# Patient Record
Sex: Female | Born: 1935
Health system: Southern US, Community
[De-identification: ages and names within clinical notes are randomized; demographics above are authoritative.]

## PROBLEM LIST (undated history)

## (undated) DIAGNOSIS — Z95810 Presence of automatic (implantable) cardiac defibrillator: Secondary | ICD-10-CM

## (undated) DIAGNOSIS — I10 Essential (primary) hypertension: Secondary | ICD-10-CM

## (undated) DIAGNOSIS — I482 Chronic atrial fibrillation, unspecified: Secondary | ICD-10-CM

## (undated) DIAGNOSIS — M5136 Other intervertebral disc degeneration, lumbar region: Secondary | ICD-10-CM

## (undated) DIAGNOSIS — E782 Mixed hyperlipidemia: Secondary | ICD-10-CM

## (undated) DIAGNOSIS — I4901 Ventricular fibrillation: Secondary | ICD-10-CM

## (undated) DIAGNOSIS — M1990 Unspecified osteoarthritis, unspecified site: Secondary | ICD-10-CM

## (undated) DIAGNOSIS — R519 Headache, unspecified: Secondary | ICD-10-CM

## (undated) DIAGNOSIS — I442 Atrioventricular block, complete: Secondary | ICD-10-CM

## (undated) DIAGNOSIS — M51369 Other intervertebral disc degeneration, lumbar region without mention of lumbar back pain or lower extremity pain: Secondary | ICD-10-CM

## (undated) DIAGNOSIS — J45909 Unspecified asthma, uncomplicated: Secondary | ICD-10-CM

## (undated) DIAGNOSIS — J449 Chronic obstructive pulmonary disease, unspecified: Secondary | ICD-10-CM

## (undated) DIAGNOSIS — I2510 Atherosclerotic heart disease of native coronary artery without angina pectoris: Secondary | ICD-10-CM

## (undated) DIAGNOSIS — I5032 Chronic diastolic (congestive) heart failure: Secondary | ICD-10-CM

## (undated) DIAGNOSIS — M549 Dorsalgia, unspecified: Secondary | ICD-10-CM

## (undated) DIAGNOSIS — F419 Anxiety disorder, unspecified: Secondary | ICD-10-CM

## (undated) DIAGNOSIS — I509 Heart failure, unspecified: Secondary | ICD-10-CM

## (undated) DIAGNOSIS — I4891 Unspecified atrial fibrillation: Secondary | ICD-10-CM

## (undated) DIAGNOSIS — G8929 Other chronic pain: Secondary | ICD-10-CM

## (undated) HISTORY — DX: Unspecified osteoarthritis, unspecified site: M19.90

## (undated) HISTORY — PX: TONSILLECTOMY: SUR1361

## (undated) HISTORY — DX: Unspecified atrial fibrillation: I48.91

## (undated) HISTORY — DX: Essential (primary) hypertension: I10

## (undated) HISTORY — DX: Heart failure, unspecified: I50.9

## (undated) HISTORY — DX: Chronic obstructive pulmonary disease, unspecified: J44.9

## (undated) HISTORY — DX: Atherosclerotic heart disease of native coronary artery without angina pectoris: I25.10

## (undated) HISTORY — DX: Presence of automatic (implantable) cardiac defibrillator: Z95.810

## (undated) HISTORY — DX: Anxiety disorder, unspecified: F41.9

## (undated) HISTORY — DX: Chronic atrial fibrillation, unspecified: I48.20

## (undated) HISTORY — PX: CORONARY ARTERY BYPASS GRAFT: SHX141

## (undated) HISTORY — DX: Headache, unspecified: R51.9

## (undated) HISTORY — PX: CARDIAC DEFIBRILLATOR PLACEMENT: SHX171

## (undated) HISTORY — DX: Mixed hyperlipidemia: E78.2

---

## 2000-12-09 ENCOUNTER — Inpatient Hospital Stay (HOSPITAL_COMMUNITY): Admission: EM | Admit: 2000-12-09 | Discharge: 2000-12-11 | Payer: Self-pay

## 2001-01-12 ENCOUNTER — Ambulatory Visit (HOSPITAL_COMMUNITY): Admission: RE | Admit: 2001-01-12 | Discharge: 2001-01-12 | Payer: Self-pay | Admitting: Specialist

## 2001-01-12 ENCOUNTER — Encounter: Payer: Self-pay | Admitting: Specialist

## 2001-04-20 DIAGNOSIS — I4901 Ventricular fibrillation: Secondary | ICD-10-CM

## 2001-04-20 HISTORY — DX: Ventricular fibrillation: I49.01

## 2001-06-08 ENCOUNTER — Other Ambulatory Visit: Admission: RE | Admit: 2001-06-08 | Discharge: 2001-06-08 | Payer: Self-pay | Admitting: Obstetrics and Gynecology

## 2002-01-20 ENCOUNTER — Ambulatory Visit (HOSPITAL_COMMUNITY): Admission: RE | Admit: 2002-01-20 | Discharge: 2002-01-20 | Payer: Self-pay | Admitting: *Deleted

## 2002-01-20 ENCOUNTER — Encounter: Payer: Self-pay | Admitting: Specialist

## 2002-03-03 ENCOUNTER — Inpatient Hospital Stay (HOSPITAL_COMMUNITY): Admission: EM | Admit: 2002-03-03 | Discharge: 2002-03-08 | Payer: Self-pay | Admitting: Emergency Medicine

## 2002-03-03 ENCOUNTER — Encounter: Payer: Self-pay | Admitting: Emergency Medicine

## 2002-03-06 ENCOUNTER — Encounter: Payer: Self-pay | Admitting: Cardiology

## 2002-05-22 ENCOUNTER — Encounter: Payer: Self-pay | Admitting: Specialist

## 2002-05-22 ENCOUNTER — Inpatient Hospital Stay (HOSPITAL_COMMUNITY): Admission: EM | Admit: 2002-05-22 | Discharge: 2002-05-27 | Payer: Self-pay | Admitting: Emergency Medicine

## 2002-05-22 ENCOUNTER — Encounter: Payer: Self-pay | Admitting: Emergency Medicine

## 2003-01-25 ENCOUNTER — Encounter: Payer: Self-pay | Admitting: Specialist

## 2003-01-25 ENCOUNTER — Ambulatory Visit (HOSPITAL_COMMUNITY): Admission: RE | Admit: 2003-01-25 | Discharge: 2003-01-25 | Payer: Self-pay | Admitting: Specialist

## 2003-03-05 ENCOUNTER — Ambulatory Visit (HOSPITAL_COMMUNITY): Admission: RE | Admit: 2003-03-05 | Discharge: 2003-03-05 | Payer: Self-pay | Admitting: Cardiology

## 2003-06-22 ENCOUNTER — Ambulatory Visit (HOSPITAL_COMMUNITY): Admission: RE | Admit: 2003-06-22 | Discharge: 2003-06-22 | Payer: Self-pay | Admitting: Pulmonary Disease

## 2003-06-26 ENCOUNTER — Ambulatory Visit (HOSPITAL_COMMUNITY): Admission: RE | Admit: 2003-06-26 | Discharge: 2003-06-26 | Payer: Self-pay | Admitting: Internal Medicine

## 2004-02-29 ENCOUNTER — Ambulatory Visit (HOSPITAL_COMMUNITY): Admission: RE | Admit: 2004-02-29 | Discharge: 2004-02-29 | Payer: Self-pay | Admitting: Specialist

## 2004-04-11 ENCOUNTER — Emergency Department (HOSPITAL_COMMUNITY): Admission: EM | Admit: 2004-04-11 | Discharge: 2004-04-12 | Payer: Self-pay | Admitting: *Deleted

## 2004-04-15 ENCOUNTER — Ambulatory Visit (HOSPITAL_COMMUNITY): Admission: RE | Admit: 2004-04-15 | Discharge: 2004-04-15 | Payer: Self-pay | Admitting: *Deleted

## 2004-04-16 ENCOUNTER — Ambulatory Visit: Payer: Self-pay

## 2004-05-02 ENCOUNTER — Ambulatory Visit: Payer: Self-pay

## 2004-05-19 ENCOUNTER — Ambulatory Visit: Payer: Self-pay | Admitting: Cardiology

## 2004-05-22 ENCOUNTER — Ambulatory Visit: Payer: Self-pay | Admitting: Cardiology

## 2004-05-22 ENCOUNTER — Emergency Department (HOSPITAL_COMMUNITY): Admission: EM | Admit: 2004-05-22 | Discharge: 2004-05-22 | Payer: Self-pay | Admitting: Emergency Medicine

## 2004-05-27 ENCOUNTER — Ambulatory Visit: Payer: Self-pay | Admitting: Cardiology

## 2004-05-27 ENCOUNTER — Inpatient Hospital Stay (HOSPITAL_BASED_OUTPATIENT_CLINIC_OR_DEPARTMENT_OTHER): Admission: RE | Admit: 2004-05-27 | Discharge: 2004-05-27 | Payer: Self-pay | Admitting: Cardiology

## 2004-05-30 ENCOUNTER — Ambulatory Visit: Payer: Self-pay | Admitting: Cardiology

## 2004-06-16 ENCOUNTER — Ambulatory Visit: Payer: Self-pay | Admitting: Cardiology

## 2004-06-19 ENCOUNTER — Ambulatory Visit: Payer: Self-pay | Admitting: Cardiology

## 2004-06-19 ENCOUNTER — Ambulatory Visit (HOSPITAL_COMMUNITY): Admission: RE | Admit: 2004-06-19 | Discharge: 2004-06-20 | Payer: Self-pay | Admitting: Cardiology

## 2004-07-08 ENCOUNTER — Ambulatory Visit: Payer: Self-pay | Admitting: Cardiology

## 2004-08-06 ENCOUNTER — Ambulatory Visit: Payer: Self-pay

## 2004-09-12 ENCOUNTER — Ambulatory Visit: Payer: Self-pay | Admitting: Cardiology

## 2004-10-05 ENCOUNTER — Emergency Department (HOSPITAL_COMMUNITY): Admission: EM | Admit: 2004-10-05 | Discharge: 2004-10-05 | Payer: Self-pay | Admitting: Emergency Medicine

## 2004-12-05 ENCOUNTER — Ambulatory Visit: Payer: Self-pay | Admitting: Internal Medicine

## 2004-12-31 ENCOUNTER — Ambulatory Visit: Payer: Self-pay | Admitting: Cardiology

## 2005-03-09 ENCOUNTER — Ambulatory Visit (HOSPITAL_COMMUNITY): Admission: RE | Admit: 2005-03-09 | Discharge: 2005-03-09 | Payer: Self-pay | Admitting: Specialist

## 2005-04-06 ENCOUNTER — Ambulatory Visit: Payer: Self-pay | Admitting: Internal Medicine

## 2005-04-23 ENCOUNTER — Ambulatory Visit: Payer: Self-pay | Admitting: Cardiology

## 2005-07-21 ENCOUNTER — Ambulatory Visit: Payer: Self-pay | Admitting: Cardiology

## 2005-08-04 ENCOUNTER — Ambulatory Visit: Payer: Self-pay | Admitting: Internal Medicine

## 2005-08-18 ENCOUNTER — Ambulatory Visit (HOSPITAL_COMMUNITY): Admission: RE | Admit: 2005-08-18 | Discharge: 2005-08-18 | Payer: Self-pay | Admitting: Pulmonary Disease

## 2005-11-12 ENCOUNTER — Ambulatory Visit: Payer: Self-pay | Admitting: Internal Medicine

## 2006-02-11 ENCOUNTER — Ambulatory Visit: Payer: Self-pay | Admitting: Internal Medicine

## 2006-03-01 ENCOUNTER — Ambulatory Visit: Payer: Self-pay | Admitting: Cardiology

## 2006-03-15 ENCOUNTER — Encounter: Payer: Self-pay | Admitting: Cardiology

## 2006-03-15 ENCOUNTER — Ambulatory Visit: Payer: Self-pay

## 2006-04-08 ENCOUNTER — Ambulatory Visit (HOSPITAL_COMMUNITY): Admission: RE | Admit: 2006-04-08 | Discharge: 2006-04-08 | Payer: Self-pay | Admitting: Specialist

## 2006-05-06 ENCOUNTER — Ambulatory Visit: Payer: Self-pay | Admitting: Internal Medicine

## 2006-08-17 ENCOUNTER — Ambulatory Visit: Payer: Self-pay | Admitting: Internal Medicine

## 2006-09-04 ENCOUNTER — Ambulatory Visit: Payer: Self-pay | Admitting: Internal Medicine

## 2006-09-15 ENCOUNTER — Ambulatory Visit: Payer: Self-pay | Admitting: Cardiology

## 2006-09-15 LAB — CONVERTED CEMR LAB
BUN: 23 mg/dL (ref 6–23)
CO2: 27 meq/L (ref 19–32)
Calcium: 9.8 mg/dL (ref 8.4–10.5)
Chloride: 106 meq/L (ref 96–112)
Creatinine, Ser: 0.9 mg/dL (ref 0.4–1.2)
GFR calc Af Amer: 79 mL/min
GFR calc non Af Amer: 65 mL/min
Glucose, Bld: 94 mg/dL (ref 70–99)
Magnesium: 2.1 mg/dL (ref 1.5–2.5)
Potassium: 4 meq/L (ref 3.5–5.1)
Sodium: 141 meq/L (ref 135–145)

## 2006-11-18 ENCOUNTER — Ambulatory Visit: Payer: Self-pay | Admitting: Internal Medicine

## 2007-02-17 ENCOUNTER — Ambulatory Visit: Payer: Self-pay | Admitting: Internal Medicine

## 2007-03-08 ENCOUNTER — Ambulatory Visit: Payer: Self-pay | Admitting: Cardiology

## 2007-03-08 LAB — CONVERTED CEMR LAB
ALT: 19 units/L (ref 0–35)
AST: 19 units/L (ref 0–37)
Albumin: 4.3 g/dL (ref 3.5–5.2)
Alkaline Phosphatase: 45 units/L (ref 39–117)
BUN: 20 mg/dL (ref 6–23)
Bilirubin, Direct: 0.1 mg/dL (ref 0.0–0.3)
CO2: 28 meq/L (ref 19–32)
Calcium: 9.5 mg/dL (ref 8.4–10.5)
Chloride: 104 meq/L (ref 96–112)
Cholesterol: 115 mg/dL (ref 0–200)
Creatinine, Ser: 1 mg/dL (ref 0.4–1.2)
GFR calc Af Amer: 70 mL/min
GFR calc non Af Amer: 58 mL/min
Glucose, Bld: 111 mg/dL — ABNORMAL HIGH (ref 70–99)
HDL: 27.7 mg/dL — ABNORMAL LOW (ref >39.0)
LDL Cholesterol: 58 mg/dL (ref 0–99)
Potassium: 4.5 meq/L (ref 3.5–5.1)
Sodium: 142 meq/L (ref 135–145)
Total Bilirubin: 1 mg/dL (ref 0.3–1.2)
Total CHOL/HDL Ratio: 4.2
Total Protein: 7.1 g/dL (ref 6.0–8.3)
Triglycerides: 147 mg/dL (ref 0–149)
VLDL: 29 mg/dL (ref 0–40)

## 2007-04-26 ENCOUNTER — Ambulatory Visit (HOSPITAL_COMMUNITY): Admission: RE | Admit: 2007-04-26 | Discharge: 2007-04-26 | Payer: Self-pay | Admitting: Specialist

## 2007-05-19 ENCOUNTER — Ambulatory Visit: Payer: Self-pay | Admitting: Internal Medicine

## 2007-08-17 ENCOUNTER — Ambulatory Visit: Payer: Self-pay | Admitting: Internal Medicine

## 2007-08-23 ENCOUNTER — Other Ambulatory Visit: Admission: RE | Admit: 2007-08-23 | Discharge: 2007-08-23 | Payer: Self-pay | Admitting: Obstetrics & Gynecology

## 2007-08-29 ENCOUNTER — Inpatient Hospital Stay (HOSPITAL_COMMUNITY): Admission: AD | Admit: 2007-08-29 | Discharge: 2007-09-01 | Payer: Self-pay | Admitting: Cardiology

## 2007-08-29 ENCOUNTER — Ambulatory Visit: Payer: Self-pay | Admitting: Cardiology

## 2007-08-30 ENCOUNTER — Encounter: Payer: Self-pay | Admitting: Cardiology

## 2007-09-19 ENCOUNTER — Ambulatory Visit: Payer: Self-pay

## 2007-10-06 ENCOUNTER — Ambulatory Visit: Payer: Self-pay | Admitting: Cardiology

## 2007-10-06 LAB — CONVERTED CEMR LAB
BUN: 18 mg/dL (ref 6–23)
CO2: 27 meq/L (ref 19–32)
Calcium: 9.9 mg/dL (ref 8.4–10.5)
Chloride: 108 meq/L (ref 96–112)
Creatinine, Ser: 1 mg/dL (ref 0.4–1.2)
GFR calc Af Amer: 70 mL/min
GFR calc non Af Amer: 58 mL/min
Glucose, Bld: 99 mg/dL (ref 70–99)
Potassium: 3.9 meq/L (ref 3.5–5.1)
Sodium: 144 meq/L (ref 135–145)

## 2007-10-19 ENCOUNTER — Ambulatory Visit: Payer: Self-pay | Admitting: Internal Medicine

## 2007-11-23 ENCOUNTER — Ambulatory Visit: Payer: Self-pay | Admitting: Cardiology

## 2008-02-01 ENCOUNTER — Ambulatory Visit: Payer: Self-pay | Admitting: Internal Medicine

## 2008-03-12 ENCOUNTER — Ambulatory Visit (HOSPITAL_COMMUNITY): Admission: RE | Admit: 2008-03-12 | Discharge: 2008-03-12 | Payer: Self-pay | Admitting: Pulmonary Disease

## 2008-03-23 ENCOUNTER — Ambulatory Visit: Payer: Self-pay | Admitting: Cardiology

## 2008-03-23 LAB — CONVERTED CEMR LAB
ALT: 23 units/L (ref 0–35)
AST: 25 units/L (ref 0–37)
Albumin: 4.3 g/dL (ref 3.5–5.2)
Alkaline Phosphatase: 49 units/L (ref 39–117)
BUN: 19 mg/dL (ref 6–23)
Bilirubin, Direct: 0.2 mg/dL (ref 0.0–0.3)
CO2: 29 meq/L (ref 19–32)
Calcium: 9.3 mg/dL (ref 8.4–10.5)
Chloride: 106 meq/L (ref 96–112)
Cholesterol: 104 mg/dL (ref 0–200)
Creatinine, Ser: 0.9 mg/dL (ref 0.4–1.2)
GFR calc Af Amer: 79 mL/min
GFR calc non Af Amer: 65 mL/min
Glucose, Bld: 111 mg/dL — ABNORMAL HIGH (ref 70–99)
HDL: 28 mg/dL — ABNORMAL LOW (ref >39.0)
LDL Cholesterol: 56 mg/dL (ref 0–99)
Potassium: 3.8 meq/L (ref 3.5–5.1)
Sodium: 142 meq/L (ref 135–145)
Total Bilirubin: 0.9 mg/dL (ref 0.3–1.2)
Total CHOL/HDL Ratio: 3.7
Total Protein: 7.1 g/dL (ref 6.0–8.3)
Triglycerides: 101 mg/dL (ref 0–149)
VLDL: 20 mg/dL (ref 0–40)

## 2008-05-10 ENCOUNTER — Ambulatory Visit: Payer: Self-pay | Admitting: Cardiology

## 2008-05-22 ENCOUNTER — Encounter: Payer: Self-pay | Admitting: Internal Medicine

## 2008-06-27 ENCOUNTER — Ambulatory Visit (HOSPITAL_COMMUNITY): Admission: RE | Admit: 2008-06-27 | Discharge: 2008-06-27 | Payer: Self-pay | Admitting: Pulmonary Disease

## 2008-08-01 ENCOUNTER — Encounter: Payer: Self-pay | Admitting: Internal Medicine

## 2008-08-01 ENCOUNTER — Ambulatory Visit: Payer: Self-pay | Admitting: Internal Medicine

## 2008-08-25 DIAGNOSIS — I259 Chronic ischemic heart disease, unspecified: Secondary | ICD-10-CM

## 2008-08-25 DIAGNOSIS — Z9861 Coronary angioplasty status: Secondary | ICD-10-CM

## 2008-08-25 DIAGNOSIS — Z951 Presence of aortocoronary bypass graft: Secondary | ICD-10-CM

## 2008-08-25 DIAGNOSIS — Z95810 Presence of automatic (implantable) cardiac defibrillator: Secondary | ICD-10-CM | POA: Insufficient documentation

## 2008-08-25 DIAGNOSIS — E782 Mixed hyperlipidemia: Secondary | ICD-10-CM

## 2008-08-25 DIAGNOSIS — I4729 Other ventricular tachycardia: Secondary | ICD-10-CM | POA: Insufficient documentation

## 2008-08-25 DIAGNOSIS — I459 Conduction disorder, unspecified: Secondary | ICD-10-CM

## 2008-08-25 DIAGNOSIS — I2789 Other specified pulmonary heart diseases: Secondary | ICD-10-CM | POA: Insufficient documentation

## 2008-08-25 DIAGNOSIS — M1990 Unspecified osteoarthritis, unspecified site: Secondary | ICD-10-CM | POA: Insufficient documentation

## 2008-08-25 DIAGNOSIS — I472 Ventricular tachycardia: Secondary | ICD-10-CM

## 2008-08-25 DIAGNOSIS — I2510 Atherosclerotic heart disease of native coronary artery without angina pectoris: Secondary | ICD-10-CM | POA: Insufficient documentation

## 2008-08-25 DIAGNOSIS — R55 Syncope and collapse: Secondary | ICD-10-CM | POA: Insufficient documentation

## 2008-08-25 DIAGNOSIS — I509 Heart failure, unspecified: Secondary | ICD-10-CM | POA: Insufficient documentation

## 2008-08-25 DIAGNOSIS — I10 Essential (primary) hypertension: Secondary | ICD-10-CM | POA: Insufficient documentation

## 2008-08-27 ENCOUNTER — Ambulatory Visit: Payer: Self-pay | Admitting: Cardiology

## 2008-08-30 LAB — CONVERTED CEMR LAB
BUN: 20 mg/dL (ref 6–23)
CO2: 31 meq/L (ref 19–32)
Calcium: 10 mg/dL (ref 8.4–10.5)
Chloride: 109 meq/L (ref 96–112)
Creatinine, Ser: 1 mg/dL (ref 0.4–1.2)
GFR calc non Af Amer: 57.52 mL/min (ref >60)
Glucose, Bld: 101 mg/dL — ABNORMAL HIGH (ref 70–99)
Potassium: 4 meq/L (ref 3.5–5.1)
Sodium: 145 meq/L (ref 135–145)

## 2008-09-10 ENCOUNTER — Encounter: Payer: Self-pay | Admitting: Cardiology

## 2008-12-27 ENCOUNTER — Other Ambulatory Visit: Admission: RE | Admit: 2008-12-27 | Discharge: 2008-12-27 | Payer: Self-pay | Admitting: Obstetrics & Gynecology

## 2008-12-28 ENCOUNTER — Ambulatory Visit: Payer: Self-pay

## 2008-12-28 ENCOUNTER — Encounter: Payer: Self-pay | Admitting: Internal Medicine

## 2008-12-31 ENCOUNTER — Ambulatory Visit: Payer: Self-pay | Admitting: Cardiology

## 2008-12-31 DIAGNOSIS — R0602 Shortness of breath: Secondary | ICD-10-CM

## 2009-01-01 LAB — CONVERTED CEMR LAB
BUN: 21 mg/dL (ref 6–23)
CO2: 30 meq/L (ref 19–32)
Calcium: 9.3 mg/dL (ref 8.4–10.5)
Chloride: 107 meq/L (ref 96–112)
Creatinine, Ser: 1.1 mg/dL (ref 0.4–1.2)
GFR calc non Af Amer: 51.48 mL/min (ref >60)
Glucose, Bld: 100 mg/dL — ABNORMAL HIGH (ref 70–99)
Potassium: 4.5 meq/L (ref 3.5–5.1)
Sodium: 142 meq/L (ref 135–145)

## 2009-01-07 ENCOUNTER — Ambulatory Visit: Payer: Self-pay

## 2009-01-07 ENCOUNTER — Encounter: Payer: Self-pay | Admitting: Cardiology

## 2009-01-07 LAB — CONVERTED CEMR LAB: Vit D, 25-Hydroxy: 33 ng/mL (ref 30–89)

## 2009-02-11 ENCOUNTER — Encounter (INDEPENDENT_AMBULATORY_CARE_PROVIDER_SITE_OTHER): Payer: Self-pay | Admitting: *Deleted

## 2009-02-15 ENCOUNTER — Ambulatory Visit: Payer: Self-pay | Admitting: Cardiology

## 2009-03-28 ENCOUNTER — Encounter: Payer: Self-pay | Admitting: Internal Medicine

## 2009-03-28 ENCOUNTER — Ambulatory Visit: Payer: Self-pay | Admitting: Cardiology

## 2009-07-10 ENCOUNTER — Ambulatory Visit: Payer: Self-pay | Admitting: Internal Medicine

## 2009-07-30 ENCOUNTER — Ambulatory Visit (HOSPITAL_COMMUNITY): Admission: RE | Admit: 2009-07-30 | Discharge: 2009-07-30 | Payer: Self-pay | Admitting: Obstetrics & Gynecology

## 2009-08-15 ENCOUNTER — Ambulatory Visit: Payer: Self-pay | Admitting: Cardiology

## 2009-08-29 ENCOUNTER — Encounter: Payer: Self-pay | Admitting: Cardiology

## 2009-08-31 LAB — CONVERTED CEMR LAB
ALT: 18 units/L (ref 0–35)
AST: 16 units/L (ref 0–37)
Albumin: 4.5 g/dL (ref 3.5–5.2)
Alkaline Phosphatase: 41 units/L (ref 39–117)
BUN: 27 mg/dL — ABNORMAL HIGH (ref 6–23)
Bilirubin, Direct: 0.2 mg/dL (ref 0.0–0.3)
CO2: 25 meq/L (ref 19–32)
Calcium: 9.7 mg/dL (ref 8.4–10.5)
Chloride: 106 meq/L (ref 96–112)
Cholesterol: 118 mg/dL (ref 0–200)
Creatinine, Ser: 1.04 mg/dL (ref 0.40–1.20)
Glucose, Bld: 102 mg/dL — ABNORMAL HIGH (ref 70–99)
HDL: 36 mg/dL — ABNORMAL LOW (ref >39)
Indirect Bilirubin: 0.4 mg/dL (ref 0.0–0.9)
LDL Cholesterol: 58 mg/dL (ref 0–99)
Potassium: 4.3 meq/L (ref 3.5–5.3)
Sodium: 144 meq/L (ref 135–145)
Total Bilirubin: 0.6 mg/dL (ref 0.3–1.2)
Total CHOL/HDL Ratio: 3.3
Total Protein: 6.9 g/dL (ref 6.0–8.3)
Triglycerides: 122 mg/dL (ref <150)
VLDL: 24 mg/dL (ref 0–40)

## 2009-09-04 ENCOUNTER — Encounter: Payer: Self-pay | Admitting: Cardiology

## 2009-09-25 ENCOUNTER — Ambulatory Visit: Payer: Self-pay | Admitting: Cardiology

## 2009-09-25 ENCOUNTER — Encounter: Payer: Self-pay | Admitting: Internal Medicine

## 2009-12-26 ENCOUNTER — Ambulatory Visit: Payer: Self-pay | Admitting: Internal Medicine

## 2010-01-20 ENCOUNTER — Other Ambulatory Visit: Admission: RE | Admit: 2010-01-20 | Discharge: 2010-01-20 | Payer: Self-pay | Admitting: Obstetrics & Gynecology

## 2010-02-18 ENCOUNTER — Encounter: Payer: Self-pay | Admitting: Cardiology

## 2010-02-18 ENCOUNTER — Ambulatory Visit: Payer: Self-pay | Admitting: Cardiology

## 2010-02-23 LAB — CONVERTED CEMR LAB
BUN: 21 mg/dL (ref 6–23)
CO2: 30 meq/L (ref 19–32)
Chloride: 107 meq/L (ref 96–112)
Glucose, Bld: 91 mg/dL (ref 70–99)
Potassium: 4.6 meq/L (ref 3.5–5.1)
Sodium: 143 meq/L (ref 135–145)

## 2010-04-03 ENCOUNTER — Ambulatory Visit: Payer: Self-pay | Admitting: Internal Medicine

## 2010-04-29 ENCOUNTER — Encounter (INDEPENDENT_AMBULATORY_CARE_PROVIDER_SITE_OTHER): Payer: Self-pay | Admitting: *Deleted

## 2010-05-11 ENCOUNTER — Encounter: Payer: Self-pay | Admitting: Emergency Medicine

## 2010-05-20 NOTE — Assessment & Plan Note (Signed)
Summary: f58m   Visit Type:  6 months follow up Primary Provider:  Kari Baars   History of Present Illness: Patient think she is about the same.   Patient walks in the neighborhood.  No chest pain or tightness with that.  She has been told she might snore a bit.    Current Medications (verified): 1)  Plavix 75 Mg Tabs (Clopidogrel Bisulfate) .... Take By Mouth Once Daily 2)  Klor-Con 10 10 Meq Cr-Tabs (Potassium Chloride) .... Take By Mouth Once Daily 3)  Toprol Xl 200 Mg Xr24h-Tab (Metoprolol Succinate) .... Take By Mouth Once Daily 4)  Aspirin 81 Mg Tabs (Aspirin) .... Take By Mouth Once Daily 5)  Actonel 35 Mg Tabs (Risedronate Sodium) .... Take Weekly As Directed. 6)  Caduet 5-40 Mg Tabs (Amlodipine-Atorvastatin) .... Take One Tablet At Bedtime 7)  Calcium Carbonate-Vitamin D 600-400 Mg-Unit  Tabs (Calcium Carbonate-Vitamin D) .... Take 1 Tablet By Mouth Two Times A Day 8)  Folic Acid 1 Mg Tabs (Folic Acid) .... Take 1 Tablet By Mouth Two Times A Day 9)  Micardis Hct 80-12.5 Mg Tabs (Telmisartan-Hctz) .... One By Mouth Daily  Allergies: 1)  ! Codeine  Vital Signs:  Patient profile:   77 year old female Height:      62 inches Weight:      173.50 pounds BMI:     31.85 Pulse rate:   62 / minute Pulse rhythm:   regular Resp:     18 per minute BP sitting:   156 / 84  (left arm) Cuff size:   large  Vitals Entered By: Vikki Ports (August 15, 2009 12:30 PM)  Physical Exam  General:  Well developed, well nourished, in no acute distress. Head:  normocephalic and atraumatic Eyes:  PERRLA/EOM intact; conjunctiva and lids normal. Lungs:  Clear bilaterally to auscultation and percussion. Heart:  PMI slightly displaced.  Normal S1. Some increase in S2.  Minimal SEM.  No DM. Pulses:  pulses normal in all 4 extremities Extremities:  No clubbing or cyanosis. Neurologic:  Alert and oriented x 3.   EKG  Procedure date:  08/15/2009  Findings:      Atrial tracking and  ventricular pacing.     ICD Specifications Following MD:  Lewayne Bunting, MD     ICD Vendor:  Med[employer]onic     ICD Model Number:  [id number]     ICD Serial Number:  [id number] ICD DOI:  08/29/2007     ICD Implanting MD:  Lewayne Bunting, MD  Lead 1:    Location: RA     DOI: 12/10/2001     Model #: [id number]     Serial #: [id number]     Status: active Lead 2:    Location: RV     DOI: 03/07/2002     Model #: [id number]     Serial #: [id number]     Status: active  Indications::  VT, SYNCOPE   ICD Follow Up ICD Dependent:  Yes      Episodes Coumadin:  No  Brady Parameters Mode DDDR     Lower Rate Limit:  60     Upper Rate Limit 130 PAV 180     Sensed AV Delay:  150  Tachy Zones VF:  200     VT1:  171     Impression & Recommendations:  Problem # 1:  CORONARY ARTERY DISEASE (ICD-414.00) Prior CABG, the subsequent PCI of PLA with DES after presentation with ischemic type VF.  Has  been stable.   Her updated medication list for this problem includes:    Plavix 75 Mg Tabs (Clopidogrel bisulfate) .[name]... Take by mouth once daily    Toprol Xl 200 Mg Xr24h-tab (Metoprolol succinate) .[name]... Take by mouth once daily    Aspirin 81 Mg Tabs (Aspirin) .[name]... Take by mouth once daily  Problem # 2:  PERCUTANEOUS TRANSLUMINAL CORONARY ANGIOPLASTY, HX OF (ICD-V45.82) see above.  Overall, stable.  Remains on DAPT, although could potentially come off of Plavix.  She prefers to stay on medication.  Problem # 3:  HYPERLIPIDEMIA (ICD-272.4) Remains on good therapy.  Needs fasting lipid and liver profile at this point to optimize. Her updated medication list for this problem includes:    Caduet 5-40 Mg Tabs (Amlodipine-atorvastatin) .[name]... Take one tablet at bedtime  Problem # 4:  PULMONARY HYPERTENSION (ICD-416.8) mechanism a bit unclear.  Diastolic dysfunction/ plusminus poss OSA.    Patient Instructions: 1)  Your physician recommends that you schedule a follow-up appointment in: 6 months . The office will mail you a reminder card 2  months prior appointment date. 2)  Your physician recommends that you return for a FASTING lipid profile: Fasting Lipid panel, LFT, BMET in [place]. (414.04, 272.4)  Appended Document: f39m These all look good.  Notify patient of results. TS

## 2010-05-20 NOTE — Assessment & Plan Note (Signed)
Summary: ROV   Visit Type:  Follow-up Primary Provider:  Kari Baars  CC:  No cardiac complaints.  History of Present Illness: Overall doing well.  Denies any chest pain.[name] [name]   Has some shortness of breath.  Denies chest pain.  Again, a long discussion today regarding Plavix, risks and benefits, etc.    Current Medications (verified): 1)  Plavix 75 Mg Tabs (Clopidogrel Bisulfate) .... Take By Mouth Once Daily 2)  Klor-Con 10 10 Meq Cr-Tabs (Potassium Chloride) .... Take By Mouth Once Daily 3)  Toprol Xl 200 Mg Xr24h-Tab (Metoprolol Succinate) .... Take By Mouth Once Daily 4)  Aspirin 81 Mg Tabs (Aspirin) .... Take By Mouth Once Daily 5)  Actonel 35 Mg Tabs (Risedronate Sodium) .... Take Weekly As Directed. 6)  Caduet 5-40 Mg Tabs (Amlodipine-Atorvastatin) .... Take One Tablet At Bedtime 7)  Calcium Carbonate-Vitamin D 600-400 Mg-Unit  Tabs (Calcium Carbonate-Vitamin D) .... Take 1 Tablet By Mouth Two Times A Day 8)  Folic Acid 1 Mg Tabs (Folic Acid) .... Take 1 Tablet By Mouth Two Times A Day 9)  Micardis Hct 80-12.5 Mg Tabs (Telmisartan-Hctz) .... One By Mouth Daily 10)  Vitamin D 2000 Unit Tabs (Cholecalciferol) .... Take 1 Tablet By Mouth Once A Day  Allergies: 1)  ! Codeine  Past History:  Past Medical History: Last updated: 09-23-2008 OSTEOARTHRITIS (ICD-715.90) PULMONARY HYPERTENSION (ICD-416.8) HYPERLIPIDEMIA (ICD-272.4) CORONARY ARTERY DISEASE (ICD-414.00) HYPERTENSION (ICD-401.9) CONGESTIVE HEART FAILURE (ICD-428.0) ISCHEMIC HEART DISEASE (ICD-414.9) HEART BLOCK (ICD-426.9) SYNCOPE (ICD-780.2) VENTRICULAR TACHYCARDIA (ICD-427.1)    Past Surgical History: Last updated: 2008-09-23 PERCUTANEOUS TRANSLUMINAL CORONARY ANGIOPLASTY, HX OF (ICD-V45.82) CORONARY ARTERY BYPASS GRAFT, FOUR VESSEL, HX OF (ICD-V45.81) IMPLANTATION OF DEFIBRILLATOR, HX OF (ICD-V45.02)  Family History: Last updated: 09/23/08 Father:Died at age 93 of MI Mother:Died at age 50 during heart  surgery Siblings: Brother died at age 86 of MI  Social History: Last updated: 2008/09/23 Retired Charity fundraiser Widowed  2 children  Vital Signs:  Patient profile:   77 year old female Height:      62 inches Weight:      176 pounds BMI:     32.31 Pulse rate:   63 / minute Pulse rhythm:   regular Resp:     18 per minute BP sitting:   158 / 78  (left arm) Cuff size:   large  Vitals Entered By: Vikki Ports (February 18, 2010 3:36 PM)  Physical Exam  General:  Well developed, well nourished, in no acute distress. Head:  normocephalic and atraumatic Eyes:  PERRLA/EOM intact; conjunctiva and lids normal. Lungs:  Clear bilaterally to auscultation and percussion. Heart:  PMI non displaced. Normal S1 and s2.  no rub or murmur. Pulses:  pulses normal in all 4 extremities Extremities:  No clubbing or cyanosis. Neurologic:  Alert and oriented x 3.   EKG  Procedure date:  02/18/2010  Findings:      Av pacing.  No other changes   ICD Specifications Following MD:  Lewayne Bunting, MD     ICD Vendor:  [employer]     ICD Model Number:  [id number]     ICD Serial Number:  [id number] ICD DOI:  08/29/2007     ICD Implanting MD:  Lewayne Bunting, MD  Lead 1:    Location: RA     DOI: 12/10/2001     Model #: [id number]     Serial #: [id number]     Status: active Lead 2:    Location: RV     DOI:  03/07/2002     Model #: 1610     Serial #: [id number]     Status: active  Indications::  VT, SYNCOPE   ICD Follow Up ICD Dependent:  Yes      Episodes Coumadin:  No  Brady Parameters Mode DDDR     Lower Rate Limit:  60     Upper Rate Limit 130 PAV 180     Sensed AV Delay:  150  Tachy Zones VF:  200     VT1:  171     Impression & Recommendations:  Problem # 1:  CORONARY ARTERY BYPASS GRAFT, FOUR VESSEL, HX OF (ICD-V45.81) No recurrent symptoms at present. Continue.  Also has a TAXUS stent in the PLA--short at 8mm length.  We reviewed pros and cons of continued clopdiogrel use.  Has mild bruising, but no overt  bleeding.   Orders: EKG w/ Interpretation (93000) TLB-BMP (Basic Metabolic Panel-BMET) (80048-METABOL)  Problem # 2:  HYPERLIPIDEMIA (ICD-272.4) stable.  Needs follow up with Dr. Juanetta Gosling. Her updated medication list for this problem includes:    Caduet 5-40 Mg Tabs (Amlodipine-atorvastatin) .[name] [name]... Take one tablet at bedtime  Orders: EKG w/ Interpretation (93000) TLB-BMP (Basic Metabolic Panel-BMET) (80048-METABOL)  Problem # 3:  HEART BLOCK (ICD-426.9) Is paced. Her updated medication list for this problem includes:    Plavix 75 Mg Tabs (Clopidogrel bisulfate) .[name] [name]... Take by mouth once daily    Toprol Xl 200 Mg Xr24h-tab (Metoprolol succinate) .[name] [name]... Take by mouth once daily    Aspirin 81 Mg Tabs (Aspirin) .[name] [name]... Take by mouth once daily  Problem # 4:  VENTRICULAR TACHYCARDIA (ICD-427.1) has not discharged ICD. Her updated medication list for this problem includes:    Plavix 75 Mg Tabs (Clopidogrel bisulfate) .[name] [name]... Take by mouth once daily    Toprol Xl 200 Mg Xr24h-tab (Metoprolol succinate) .[name] [name]... Take by mouth once daily    Aspirin 81 Mg Tabs (Aspirin) .[name] [name]... Take by mouth once daily  Patient Instructions: 1)  Your physician recommends that you continue on your current medications as directed. Please refer to the Current Medication list given to you today. 2)  Your physician wants you to follow-up in: 6 MONTHS.  You will receive a reminder letter in the mail two months in advance. If you don't receive a letter, please call our office to schedule the follow-up appointment. 3)  Your physician recommends that you have lab work today: BMP

## 2010-05-20 NOTE — Cardiovascular Report (Signed)
Summary: Office Visit   Office Visit   Imported By: Roderic Ovens 04/25/2009 14:17:55  _____________________________________________________________________  External Attachment:    Type:   Image     Comment:   External Document

## 2010-05-20 NOTE — Assessment & Plan Note (Signed)
Summary: 3 mth f/u per checkout on 03/28/09/tg   Visit Type:  Follow-up Primary Provider:  Kari [name]  CC:  no cardiology complaints .  History of Present Illness: [name] [name] returns today for followup.  She is a very pleasant woman with a history of complete heart block, also with a history of syncope and VT with despite fairly preserved LV function who underwent ICD insertion in the past.  She returns today for followup.  She has done quite well in the interim.  She denies chest pain or shortness of breath.  She has had no intercurrent ICD therapies.  She has very minimal if any peripheral edema.  She continues to be quite active without any particular symptoms.   Today she has wondered about stopping her plavix.  She tells me that Dr. Riley [name] would not give her a definitive recommendation and she continues to take the medication.  No recent ICD shocks.  Current Medications (verified): 1)  Plavix 75 Mg Tabs (Clopidogrel Bisulfate) .... Take By Mouth Once Daily 2)  Klor-Con 10 10 Meq Cr-Tabs (Potassium Chloride) .... Take By Mouth Once Daily 3)  Toprol Xl 200 Mg Xr24h-Tab (Metoprolol Succinate) .... Take By Mouth Once Daily 4)  Aspirin 81 Mg Tabs (Aspirin) .... Take By Mouth Once Daily 5)  Actonel 35 Mg Tabs (Risedronate Sodium) .... Take Weekly As Directed. 6)  Caduet 5-40 Mg Tabs (Amlodipine-Atorvastatin) .... Take One Tablet At Bedtime 7)  Calcium Carbonate-Vitamin D 600-400 Mg-Unit  Tabs (Calcium Carbonate-Vitamin D) .... Take 1 Tablet By Mouth Two Times A Day 8)  Folic Acid 1 Mg Tabs (Folic Acid) .... Take 1 Tablet By Mouth Two Times A Day 9)  Micardis Hct 80-12.5 Mg Tabs (Telmisartan-Hctz) .... One By Mouth Daily  Allergies (verified): 1)  ! Codeine  Past History:  Past Medical History: Last updated: 08/25/2008 OSTEOARTHRITIS (ICD-715.90) PULMONARY HYPERTENSION (ICD-416.8) HYPERLIPIDEMIA (ICD-272.4) CORONARY ARTERY DISEASE (ICD-414.00) HYPERTENSION (ICD-401.9) CONGESTIVE  HEART FAILURE (ICD-428.0) ISCHEMIC HEART DISEASE (ICD-414.9) HEART BLOCK (ICD-426.9) SYNCOPE (ICD-780.2) VENTRICULAR TACHYCARDIA (ICD-427.1)    Past Surgical History: Last updated: 08/25/2008 PERCUTANEOUS TRANSLUMINAL CORONARY ANGIOPLASTY, HX OF (ICD-V45.82) CORONARY ARTERY BYPASS GRAFT, FOUR VESSEL, HX OF (ICD-V45.81) IMPLANTATION OF DEFIBRILLATOR, HX OF (ICD-V45.02)  Review of Systems  The patient denies chest pain, syncope, dyspnea on exertion, and peripheral edema.    Vital Signs:  Patient profile:   77 year old female Weight:      176 pounds Pulse rate:   71 / minute BP sitting:   148 / 72  (right arm)  Vitals Entered By: [name] Saa, CNA (July 10, 2009 3:32 PM)  Physical Exam  General:  Well developed, well nourished, in no acute distress. Head:  normocephalic and atraumatic Eyes:  PERRLA/EOM intact; conjunctiva and lids normal. Neck:  Neck supple, no JVD. No masses, thyromegaly or abnormal cervical nodes. Chest Wall:  Well healed ICD incision. Lungs:  Clear bilaterally to auscultation with no wheezes, rales, or rhonchi. Heart:  Paradoxic splitting of S2.  Soft SEM.  No DM.   Msk:  Back normal, normal gait. Muscle strength and tone normal. Pulses:  pulses normal in all 4 extremities Extremities:  No clubbing or cyanosis. Neurologic:  Alert and oriented x 3.    ICD Specifications Following MD:  [name] Bunting, MD     ICD Vendor:  [employer]     ICD Model Number:  [id number]     ICD Serial Number:  [id number] H ICD DOI:  08/29/2007     ICD Implanting MD:  [name] Bunting, MD  Lead 1:    Location: RA     DOI: 12/10/2001     Model #: 1610     Serial #: [id number]     Status: active Lead 2:    Location: RV     DOI: 03/07/2002     Model #: 0157     Serial #: [id number]     Status: active  Indications::  VT, SYNCOPE   ICD Follow Up Remote Check?  No Battery Voltage:  3.10 V     Charge Time:  9.1 seconds     Underlying rhythm:  dependent ICD Dependent:  Yes       ICD Device  Measurements Atrium:  Amplitude: 3.4 mV, Impedance: 592 ohms, Threshold: 0.5 V at 0.4 msec Right Ventricle:  Impedance: 400 ohms, Threshold: 1.0 V at 0.5 msec Shock Impedance: 50/64 ohms   Episodes MS Episodes:  2     Percent Mode Switch:  <0.1%     Coumadin:  No Shock:  0     ATP:  0     Nonsustained:  1     Atrial Pacing:  97.3%     Ventricular Pacing:  100%  Brady Parameters Mode DDDR     Lower Rate Limit:  60     Upper Rate Limit 130 PAV 180     Sensed AV Delay:  150  Tachy Zones VF:  200     VT1:  171     Next Cardiology Appt Due:  09/18/2009 Tech Comments:  No parameter changes.  Device function normal  ROV 3 months in the [facility] clinic. [name] Harm, LPN  July 10, 2009 3:48 PM  MD Comments:  Agree with above.  Impression & Recommendations:  Problem # 1:  IMPLANTATION OF DEFIBRILLATOR, HX OF (ICD-V45.02) Her ICD is working normally and her incision is nicely healed.  Will recheck in several months.  Problem # 2:  CORONARY ARTERY BYPASS GRAFT, FOUR VESSEL, HX OF (ICD-V45.81) She denies anginal symptoms and she will continue her current meds.  Problem # 3:  HYPERTENSION (ICD-401.9) Her blood pressure remains sligtly elevated but she will work on a low sodium diet. Her updated medication list for this problem includes:    Toprol Xl 200 Mg Xr24h-tab (Metoprolol succinate) .[name]... Take by mouth once daily    Aspirin 81 Mg Tabs (Aspirin) .[name]... Take by mouth once daily    Caduet 5-40 Mg Tabs (Amlodipine-atorvastatin) .[name]... Take one tablet at bedtime    Micardis Hct 80-12.5 Mg Tabs (Telmisartan-hctz) ..... One by mouth daily  Patient Instructions: 1)  Your physician recommends that you schedule a follow-up appointment in: 1 year 2)  Your physician recommends that you continue on your current medications as directed. Please refer to the Current Medication list given to you today.

## 2010-05-20 NOTE — Procedures (Signed)
Summary: 3 mth f/u per checkout on 07/10/09/tg   Current Medications (verified): 1)  Plavix 75 Mg Tabs (Clopidogrel Bisulfate) .... Take By Mouth Once Daily 2)  Klor-Con 10 10 Meq Cr-Tabs (Potassium Chloride) .... Take By Mouth Once Daily 3)  Toprol Xl 200 Mg Xr24h-Tab (Metoprolol Succinate) .... Take By Mouth Once Daily 4)  Aspirin 81 Mg Tabs (Aspirin) .... Take By Mouth Once Daily 5)  Actonel 35 Mg Tabs (Risedronate Sodium) .... Take Weekly As Directed. 6)  Caduet 5-40 Mg Tabs (Amlodipine-Atorvastatin) .... Take One Tablet At Bedtime 7)  Calcium Carbonate-Vitamin D 600-400 Mg-Unit  Tabs (Calcium Carbonate-Vitamin D) .... Take 1 Tablet By Mouth Two Times A Day 8)  Folic Acid 1 Mg Tabs (Folic Acid) .... Take 1 Tablet By Mouth Two Times A Day 9)  Micardis Hct 80-12.5 Mg Tabs (Telmisartan-Hctz) .... One By Mouth Daily  Allergies (verified): 1)  ! Codeine    ICD Specifications Following MD:  Lewayne Bunting, MD     ICD Vendor:  Med[employer]onic     ICD Model Number:  [id number]     ICD Serial Number:  [id number] ICD DOI:  08/29/2007     ICD Implanting MD:  Lewayne Bunting, MD  Lead 1:    Location: RA     DOI: 12/10/2001     Model #: 4034     Serial #: [id number]     Status: active Lead 2:    Location: RV     DOI: 03/07/2002     Model #: 6387     Serial #: [id number]     Status: active  Indications::  VT, SYNCOPE   ICD Follow Up Remote Check?  No Battery Voltage:  3.07 V     Charge Time:  9.4 seconds     Underlying rhythm:  dependent ICD Dependent:  Yes       ICD Device Measurements Atrium:  Amplitude: 3.3 mV, Impedance: 584 ohms, Threshold: 1.0 V at 0.5 msec Right Ventricle:  Impedance: 392 ohms, Threshold: 1.0 V at 0.5 msec Shock Impedance: 49/64 ohms   Episodes MS Episodes:  0     Percent Mode Switch:  0     Coumadin:  No Shock:  0     ATP:  0     Nonsustained:  0     Atrial Pacing:  93.6%     Ventricular Pacing:  99.9%  Brady Parameters Mode DDDR     Lower Rate Limit:  60     Upper Rate Limit  130 PAV 180     Sensed AV Delay:  150  Tachy Zones VF:  200     VT1:  171     Next Remote Date:  12/26/2009     Next Cardiology Appt Due:  06/19/2010 Tech Comments:  No parameter changes.  Device function normal.  [facility] transmissions every 3 months.  ROV 3/12 with Dr. Ladona Ridgel in [facility]. Altha Harm, LPN  September 25, 9516 12:50 PM

## 2010-05-20 NOTE — Cardiovascular Report (Signed)
Summary: Office Visit   Office Visit   Imported By: Roderic Ovens 10/12/2009 12:03:25  _____________________________________________________________________  External Attachment:    Type:   Image     Comment:   External Document

## 2010-05-20 NOTE — Letter (Signed)
Summary: Custom - Lipid  [facility], Main Office  [address]   [place], [place] [address]   Phone: [phone]  Fax: [phone]     Sep 04, 2009 MRN: [id number]   [name] [name] [address] [place], [place]  [address]   Dear Ms. Gorsline,  We have reviewed your cholesterol results.  They are as follows:     Total Cholesterol:    118 (Desirable: less than 200)       HDL  Cholesterol:     36  (Desirable: greater than 40 for men and 50 for women)       LDL Cholesterol:       58  (Desirable: less than 100 for low risk and less than 70 for moderate to high risk)       Triglycerides:       122  (Desirable: less than 150)  Our recommendations include: Your cholesterol numbers are within normal.  Your liver function, potassium and kidney function are also within normal.  Please continue your current medications per Dr Riley Kill.   Call our office at the number listed above if you have any questions.  Lowering your LDL cholesterol is important, but it is only one of a large number of "risk factors" that may indicate that you are at risk for heart disease, stroke or other complications of hardening of the arteries.  Other risk factors include:   A.  Cigarette Smoking* B.  High Blood Pressure* C.  Obesity* D.   Low HDL Cholesterol (see yours above)* E.   Diabetes Mellitus (higher risk if your is uncontrolled) F.  Family history of premature heart disease G.  Previous history of stroke or cardiovascular disease    *These are risk factors YOU HAVE CONTROL OVER.  For more information, visit .  There is now evidence that lowering the TOTAL CHOLESTEROL AND LDL CHOLESTEROL can reduce the risk of heart disease.  The American Heart Association recommends the following guidelines for the treatment of elevated cholesterol:  1.  If there is now current heart disease and less than two risk factors, TOTAL CHOLESTEROL should be less than 200 and LDL CHOLESTEROL should be less  than 100. 2.  If there is current heart disease or two or more risk factors, TOTAL CHOLESTEROL should be less than 200 and LDL CHOLESTEROL should be less than 70.  A diet low in cholesterol, saturated fat, and calories is the cornerstone of treatment for elevated cholesterol.  Cessation of smoking and exercise are also important in the management of elevated cholesterol and preventing vascular disease.  Studies have shown that 30 to 60 minutes of physical activity most days can help lower blood pressure, lower cholesterol, and keep your weight at a healthy level.  Drug therapy is used when cholesterol levels do not respond to therapeutic lifestyle changes (smoking cessation, diet, and exercise) and remains unacceptably high.  If medication is started, it is important to have you levels checked periodically to evaluate the need for further treatment options.  Thank you,   Julieta Gutting RN, BSN [facility] Team

## 2010-05-20 NOTE — Cardiovascular Report (Signed)
Summary: Office Visit Remote   Office Visit Remote   Imported By: Roderic Ovens 01/06/2010 15:30:25  _____________________________________________________________________  External Attachment:    Type:   Image     Comment:   External Document

## 2010-05-22 NOTE — Cardiovascular Report (Signed)
Summary: Office Visit Remote   Office Visit Remote   Imported By: Roderic Ovens 05/02/2010 10:54:33  _____________________________________________________________________  External Attachment:    Type:   Image     Comment:   External Document

## 2010-05-22 NOTE — Letter (Signed)
Summary: Remote Device Check  [facility], Main Office  [address]   [place], [place] [address]   Phone: [phone]  Fax: [phone]     April 29, 2010 MRN: [id number]   Peggi Adolf [address] [place], [place]  [address]   Dear Ms. Jefcoat,   Your remote transmission was recieved and reviewed by your physician.  All diagnostics were within normal limits for you.  ___X____Your next office visit is scheduled for:  March 2012 with Dr Ladona Ridgel. Please call our office to schedule an appointment.    Sincerely,  Vella Kohler

## 2010-06-23 ENCOUNTER — Other Ambulatory Visit (HOSPITAL_COMMUNITY): Payer: Self-pay | Admitting: Pulmonary Disease

## 2010-06-23 DIAGNOSIS — Z139 Encounter for screening, unspecified: Secondary | ICD-10-CM

## 2010-08-04 ENCOUNTER — Ambulatory Visit (HOSPITAL_COMMUNITY)
Admission: RE | Admit: 2010-08-04 | Discharge: 2010-08-04 | Disposition: A | Discharge status: Home / Self Care | Payer: Medicare Other | Source: Ambulatory Visit | Attending: Pulmonary Disease | Admitting: Pulmonary Disease

## 2010-08-04 DIAGNOSIS — Z139 Encounter for screening, unspecified: Secondary | ICD-10-CM

## 2010-08-04 DIAGNOSIS — Z1231 Encounter for screening mammogram for malignant neoplasm of breast: Secondary | ICD-10-CM | POA: Insufficient documentation

## 2010-08-19 ENCOUNTER — Ambulatory Visit (INDEPENDENT_AMBULATORY_CARE_PROVIDER_SITE_OTHER): Payer: Medicare Other | Admitting: Internal Medicine

## 2010-08-19 ENCOUNTER — Encounter: Payer: Self-pay | Admitting: Internal Medicine

## 2010-08-19 DIAGNOSIS — I509 Heart failure, unspecified: Secondary | ICD-10-CM

## 2010-08-19 DIAGNOSIS — I259 Chronic ischemic heart disease, unspecified: Secondary | ICD-10-CM

## 2010-08-19 DIAGNOSIS — I2589 Other forms of chronic ischemic heart disease: Secondary | ICD-10-CM

## 2010-08-19 DIAGNOSIS — I10 Essential (primary) hypertension: Secondary | ICD-10-CM

## 2010-08-19 DIAGNOSIS — Z95810 Presence of automatic (implantable) cardiac defibrillator: Secondary | ICD-10-CM

## 2010-08-19 NOTE — Patient Instructions (Signed)
**Note De-identified Leyah Bocchino Obfuscation** Your physician recommends that you schedule a follow-up appointment in: 1 year    Your physician recommends that you continue on your current medications as directed. Please refer to the Current Medication list given to you today.  

## 2010-08-19 NOTE — Progress Notes (Signed)
HPI [name] returns today for followup. He is a pleasant 76 year old woman, with an ischemic cardiomyopathy, congestive heart failure, hypertension, who is status post ICD implantation. She denies chest pain, shortness of breath, or peripheral edema. She has had no ICD shocks. Patient admits to some dietary indiscretion. She continues to have trouble controlling her blood pressure. Allergies  Allergen Reactions  . Codeine     REACTION: Nausea     Current Outpatient Prescriptions  Medication Sig Dispense Refill  . amLODipine-atorvastatin (CADUET) 5-40 MG per tablet Take 1 tablet by mouth daily.        [name] aspirin 81 MG tablet Take 81 mg by mouth daily.        . calcium carbonate 200 MG capsule Take 250 mg by mouth 2 (two) times daily with a meal.        . Cholecalciferol (VITAMIN D) 2000 UNITS CAPS Take 1 capsule by mouth daily.        . clopidogrel (PLAVIX) 75 MG tablet Take 75 mg by mouth daily.        . folic acid (FOLVITE) 1 MG tablet Take 1 mg by mouth daily.        . metoprolol (TOPROL-XL) 200 MG 24 hr tablet Take 200 mg by mouth daily.        . potassium chloride (KLOR-CON) 10 MEQ CR tablet Take 10 mEq by mouth daily.        [name] telmisartan-hydrochlorothiazide (MICARDIS HCT) 80-12.5 MG per tablet Take 1 tablet by mouth daily.        [name] DISCONTD: potassium chloride (K-DUR,KLOR-CON) 10 MEQ tablet       . DISCONTD: risedronate (ACTONEL) 35 MG tablet Take 35 mg by mouth every 7 (seven) days. with water on empty stomach, nothing by mouth or lie down for next 30 minutes.          Past Medical History  Diagnosis Date  . Osteoarthritis (arthritis due to wear and tear of joints)   . Pulmonary hypertension   . Hyperlipidemia   . CAD (coronary artery disease)   . Hypertension   . CHF (congestive heart failure)   . Ischemic heart disease   . Heart block   . Syncope   . Ventricular tachycardia     ROS:   All systems reviewed and negative except as noted in the HPI.   Past Surgical  History  Procedure Date  . Coronary angioplasty with stent placement   . Coronary artery bypass graft     times 4  . Cardiac defibrillator placement      No family history on file.   History   Social History  . Marital Status: Widowed    Spouse Name: N/A    Number of Children: 2  . Years of Education: N/A   Occupational History  . retired     Designer, jewellery   Social History Main Topics  . Smoking status: Never Smoker   . Smokeless tobacco: Never Used  . Alcohol Use: No  . Drug Use: No  . Sexually Active: Not on file   Other Topics Concern  . Not on file   Social History Narrative  . No narrative on file     BP 163/63  Pulse 70  Ht 5\' 2"  (1.575 m)  Wt 173 lb (78.472 kg)  BMI 31.64 kg/m2  Physical Exam:  Well appearing NAD HEENT: Unremarkable Neck:  No JVD, no thyromegally Lymphatics:  No adenopathy Back:  No CVA tenderness Lungs:  Clear. Well-healed ICD incision. HEART:  Regular rate rhythm, with a 2/6 systolic murmur heard best at the left lower sternal border. Abd:  Flat, positive bowel sounds, no organomegally, no rebound, no guarding Ext:  2 plus pulses, no edema, no cyanosis, no clubbing Skin:  No rashes no nodules Neuro:  CN II through XII intact, motor grossly intact DEVICE  Normal device function.  See PaceArt for details.   Assess/Plan:

## 2010-08-19 NOTE — Assessment & Plan Note (Signed)
She denies anginal symptoms. I have encouraged her to continue her current medications, maintain a low-salt diet, and increase her activity.

## 2010-08-19 NOTE — Assessment & Plan Note (Signed)
Her device is working normally. Will recheck in several months. 

## 2010-08-19 NOTE — Assessment & Plan Note (Signed)
Her blood pressure remains elevated. She admits to eating too much salt. I have asked that she continue to follow her blood pressure and cause if her pressures remain high. In addition I have asked that she emphasize more walking.

## 2010-09-02 NOTE — Consult Note (Signed)
NAME:  [name], [name]                  ACCOUNT NO.:  [id number]   MEDICAL RECORD NO.:  [id number]           PATIENT TYPE:   LOCATION:                                 FACILITY:   PHYSICIAN:  Christopher E. Ezzard Standing, M.D.DATE OF BIRTH:  Jul 09, 1931   DATE OF CONSULTATION:  08/30/2007  DATE OF DISCHARGE:                                 CONSULTATION   BRIEF HISTORY:  [name] [name] is a 77 year old female who apparently passed  out while in her bathroom yesterday, sustaining trauma to her face and  head.  She was initially seen at [facility] Emergency Room where she had  a CT scan, which demonstrated a left blowout fracture and subsequent  transferred to [facility] for workup of passing out as well as head  trauma.   On review of the CT scan that the patient has a moderate size left  orbital blowout fracture.  The inferior rectus muscle is not entrapped.  Pupils were equal, round, and reactive.  She did have some slight double  vision on straight eye gaze but had reasonable mobility superiorly with  the left eye.  There is no obvious entrapment but fairly diffuse  swelling as well as ecchymosis.  Blowing her nose heard for the next 5  days.  Elevate head of bed and apply cool compress to reduce swelling,  however follow up in my office in 7-10 days for recheck and  reevaluation.  Also, recommend evaluation by Ophthalmology in this next  week.           ______________________________  Kristine Garbe. Ezzard Standing, M.D.     CEN/MEDQ  D:  08/30/2007  T:  08/31/2007  Job:  [id number]

## 2010-09-02 NOTE — Assessment & Plan Note (Signed)
[facility]                         ELECTROPHYSIOLOGY OFFICE NOTE   CHAROLETTE, BULTMAN                         MRN:          [id number]  DATE:08/01/2008                            DOB:          [birth year]    Ms. Wiehe returns today for followup.  She is a very pleasant woman with  a history of complete heart block, also with a history of syncope and VT  with despite fairly preserved LV function who underwent ICD insertion in  the past.  She returns today for followup.  She has done quite well in  the interim.  She denies chest pain or shortness of breath.  She has had  no intercurrent ICD therapies.  She has very minimal if any peripheral  edema.  She continues to be quite active without any particular  symptoms.   MEDICATIONS:  1. Plavix 75 a day.  2. Avalide 300/12.5 a day.  3. Potassium 10 a day.  4. Toprol-XL 200 a day.  5. Calcium 600 twice a day.  6. Folic acid 0.4 mg twice daily.  7. Aspirin 81 mg daily.  8. Caduet 5/40 daily.   PHYSICAL EXAMINATION:  GENERAL:  She is a pleasant elderly woman in no  distress.  VITAL SIGNS:  Blood pressure of 136/65, pulse 69 and regular,  respirations were 18, weight was 176 pounds.  NECK:  No jugular venous distention.  LUNGS:  Clear bilaterally to auscultation.  No wheezes, rales, or  rhonchi are present.  There is no increased work of breathing.  CARDIOVASCULAR:  Regular rate and rhythm.  Normal S1 and S2.  ABDOMEN:  Soft, nontender.  There is no organomegaly.  Bowel sounds are  present.  No rebound or guarding.  EXTREMITIES:  No cyanosis, clubbing, or edema.  The pulses are 2+ and  symmetric.   Interrogation of her defibrillator demonstrates a Medtronic EnTrust.  The impedances were 550 in the atrium and 408 in the ventricle.  The P-  waves were 3.  There are no R-waves secondary to complete heart block.  The thresholds were within satisfactory limits.  The patient was in the  DDDR mode.  The  shocking impedances were 48 and 62 respectively.   IMPRESSION:  1. Ventricular tachycardia and syncope.  2. Complete heart block.  3. Status post dual-chamber implantable cardioverter-defibrillator.   DISCUSSION:  Overall, Ms. Market is stable.  Her defibrillator is working  normally.  Her heart failure symptoms are really quite quiescent.  She  has had no intercurrent ICD therapies.  We will plan to see the patient  back for ICD followup in 1 year.     Doylene Canning. Ladona Ridgel, MD  Electronically Signed    GWT/MedQ  DD: 08/01/2008  DT: 08/02/2008  Job #: [id number]   cc:   Ramon Dredge L. Juanetta Gosling, M.D.

## 2010-09-02 NOTE — Discharge Summary (Signed)
NAME:  [name] [name], [name] [name]                  ACCOUNT NO.:  [id number]   MEDICAL RECORD NO.:  [id number]          PATIENT TYPE:  INP   LOCATION:  2924                         FACILITY:  [facility]   PHYSICIAN:  Maple Mirza, PA   DATE OF BIRTH:  03-03-1932   DATE OF ADMISSION:  08/29/2007  DATE OF DISCHARGE:  09/01/2007                               DISCHARGE SUMMARY   ALLERGIES:  She has an allergy to CODEINE.   This dictation greater than 35 minutes.   FINAL DIAGNOSES:  1. Discharged in day #3 status post generator change out.      a.     Implant of a Medtronic EnTrust dual-chamber, ICD.      b.     Explant of existing Guidant Ventak Prizm 2 DR, ICD.      c.     Rate sensed right ventricular lead disconnected and ICD rate       sense lead re-utilized.  2. Admitted with syncope and traumatic fall.      a.     Bilateral orbital ecchymoses.      b.     Left maxillary sinus floor fracture.      c.     Frontal hematoma extending to the left orbit.      d.     Computed tomogram of the head negative for intracranial       insult.  3. Pacemaker lead fracture with resultant intermittent heart block,      possible cause of syncope this admission.  4. A 2D echocardiogram Aug 30, 2007, ejection fraction 55% to 65%,      hypokinesis of the periapical wall, mild mitral regurgitation.   PAST MEDICAL HISTORY:  1. Coronary artery disease status post coronary artery bypass graft      surgery in 1998.  2. Pacemaker implanted 2002 for complete heart block.  3. Pacemaker upgraded to ICD, November 2003 for ventricular      fibrillation/syncope.  4. Hypertension.  5. Hypercholesterolemia  6. Left heart cath in March 2006 with PCI of the posterolateral branch      of the distal right coronary artery.  7. Left heart catheterization in February 2006 for PMVT/ICD discharge.  8. A 2D echocardiogram in November 2007 ejection fraction 55% to 60%      evidence of diastolic dysfunction.   PROCEDURE:  Aug 29, 2007, explant of existing Guidant Ventak Prizm 2  cardioverter-defibrillator with implant of a Medtronic EnTrust dual-  chamber pace ICD with disabling of the right ventricular pacing lead and  re-utilizing the ICD rate sense lead, Dr. Lewayne Bunting.   CONSULTS THIS ADMISSION:  Dr. Dillard Cannon.  Dr. Ezzard Standing reviewed  the CT scan that showed a moderate size left orbital blowout fracture.  The inferior rectus muscle was not entrapped.  She did have some slight  double vision, but reasonable mobility superior in the left eye.   RECOMMENDATIONS:  The patient should not blow her nose hard for the next  5 days.  The patient should have the head of bed elevated and cool  compresses applied facially to reduce the swelling.  She will follow up  with Dr. Ezzard Standing in 7-10 days for recheck and reevaluation.   BRIEF HISTORY:  [name] [name] is a 77 year old female.  She has a history of  coronary artery disease.  She is status post coronary artery bypass  graft surgery in 1998.  She has cardiovascular risk factors of  hypertension and dyslipidemia.  In the past, she had a pacemaker  implanted in 2002 for complete heart block.  The pacemaker was upgraded  to an ICD November 2003 when the patient had ventricular fibrillation  episode with syncope.   The patient was admitted to [facility] on Aug 29, 2007, in transfer from  [place].  The morning of this admission, she took a shower.  She  shortly got out of the tub and started to towel off; however, what  happened next she is not sure, without warning she had an uncontrolled  fall.  She has obvious facial trauma.  Prior to the collapse, she had no  chest pain, no palpitation, no dizziness, no dyspnea.   CT of the head at [facility] shows no acute intracranial insult, but  there is a frontal scalp hematoma extending into the left eyelid and  maxillofacial CT shows that the left maxillary sinus has a floor  fracture.  The globes are intact.  There is  no muscle entrapment.   Telemetry here at [facility] shows alternating heart rates either fast,  sometimes slow.  There are even pauses.  It seemed somewhat dependent on  the patient's movement.  Her slow rates are very slow and these are  ventricular escape beats, very evident that the patient has third-degree  heart block at times.  The faster beats are heart rate of 60, which may  represent appropriate though intermittent device function.  There have  also been pauses up to 6 seconds.  Interrogation of the device shows a  possible right ventricular lead fracture with failure to capture even  when the voltage is turned up by the Producer, television/film/video.   The patient has been having dizzy spells for some weeks.  She is always  having right to the verge of syncope.  She had an episode with Dr.  Ladona Ridgel on August 17, 2007.  The device was interrogated at that time.  It  showed multiple episodes of NSVT.  The device was 100% V pacing and 97%  A pacing.  Now the device is really unable to V pace.   HOSPITAL COURSE:  The patient presents in transfer from [facility] after having a traumatic fall on the morning of Aug 29, 2007.  Her pacemaker function is malfunctioning.  She has underlying complete  heart block and this shows through and the pacemaker fails to capture  and also fails to inadequately sense.  It is thought to be mainly a  sensing problem and it is probably secondary to a fractured right  ventricular lead.  The patient was seen in consultation by Dr. Lewayne Bunting and intervention was planned that very evening.  The patient had  ICD generator change out and the right ventricular pacing lead, which  had been utilized for sensing was deactivated and the ICD lead was re-  utilized for the sensing and pacing function.  The patient has done well  in the next 3 days.  Her ecchymoses are still present, but the swelling  in her forehead has gone down and the  headache that she  has had has been  getting better.  As mentioned above, she was seen by Dr. Dillard Cannon in consultation for ENT.  His recommendations have been made and  the patient has been following on this admission.  Dr. Ezzard Standing also  recommended an ophthalmology consult and the patient will arrange for  this through her optometrist.  After the generator change out and lead  revision, the patient has had no episodes of pausing or complete heart  block nor has she felt dizzy and disoriented.  She has had no recurrent  presyncope or syncope.  The patient has been on IV antibiotics in the  peri-implant and also on oral antibiotics for the next 5 days.  She has  2 more days to go as an outpatient and she will be given a prescription  for antibiotics.  At discharge, she is asked to keep her incision dry  until Monday, Sep 05, 2007, and to sponge bathe until then.   MEDICATIONS AT DISCHARGE:  1. Antibiotic Keflex 500 mg one tablet 1-1/2 hour before breakfast,      lunch, dinner, and bedtime for the next 2 days.  2. Plavix 75 mg daily.  3. Avalide 300/12.5 one tablet daily.  4. Calcium 600 mg twice daily.  5. Folic acid 4 mcg daily.  6. Enteric-coated aspirin 81 mg daily.  7. Caduet 5/40 daily at bedtime.  8. K-Dur 10 mEq daily.  9. Toprol XL 200 mg daily.  10.Actonel 325 mg weekly.   She has followup:  1. Dr. Dillard Cannon, his office is [address] [place],      [place], Friday, Sep 09, 2007, at 3:45.  2. ICD Clinic at [facility] [address] for      incision check Monday, September 19, 2007, at 9 o'clock.  3. To see Dr. Riley Kill, this is a new date and time, Thursday, October 06, 2007, at 11:45.  4. To see Dr. Ladona Ridgel at [facility], the [facility] office      Wednesday, December 14, 2007, at 3 o'clock.   The patient will make arrangements to see an ophthalmologist through her  optometrist.   LAB STUDIES THIS ADMISSION:  On Aug 31, 2007, the  hemoglobin was 14,  hematocrit 40.5, white cells 9.2, platelets 199.  Serum electrolytes on  Aug 31, 2007, sodium 138, potassium 3.5, chloride 104, carbonate 27, BUN  is 21, creatinine 0.98, and glucose is 114.      Maple Mirza, PA     GM/MEDQ  D:  09/01/2007  T:  09/02/2007  Job:  [id number]   cc:   Doylene Canning. Ladona Ridgel, MD  Arturo Morton. Riley Kill, MD, [facility]  Kristine Garbe. Ezzard Standing, M.D.  Edward L. Juanetta Gosling, M.D.

## 2010-09-02 NOTE — Assessment & Plan Note (Signed)
[name] [name]                            CARDIOLOGY OFFICE NOTE   [name] [name], [name] [name]                         MRN:          [id number]  DATE:03/08/2007                            DOB:          1931/09/24    [name] [name] is in for a followup visit. Other than minor shortness of breath,  she is doing reasonably well. She has no syncope or presyncope. She has  had no chest pain.   She continued as well on her medications. These include:  1. Plavix 75 mg daily.  2. Avalide 300/12.5 daily.  3. Caduet 5/40 q.h.s.  4. K-Dur 10 mEq daily.  5. Toprol XL 200 mg daily.  6. Calcium with vitamin D b.i.d.  7. Folic acid 0.4 mg b.i.d.  8. Aspirin 81 mg daily.  9. Actonel 35 mg weekly.   PHYSICAL EXAMINATION:  Blood pressure is 160/80, the pulse is 60. Lung  fields are clear. There is a paradoxical splitting of the second heart  sound, a soft systolic ejection murmur. No diastolic murmurs are  appreciated.   EKG reveals atrial ventricular pacing.   IMPRESSION:  1. Status post implantation of a stent in the posterolateral segment      with prior revascularization surgery.  2. Symptomatic polymorphic ventricular tachycardia with syncope status      post dual-chamber implantable cardiodefibrillator.  3. History of complete heart block status post permanent implantation      of permanent pacing.  4. Hypercholesterolemia.  5. Hypertension not well controlled.   PLAN:  1. Lipid and liver profile. Basic metabolic panel.  2. Return to clinic in 6 months.  3. Increase Caduet 10/40.     Arturo Morton. Riley Kill, MD, [facility]  Electronically Signed    TDS/MedQ  DD: 03/08/2007  DT: 03/08/2007  Job #: [id number]

## 2010-09-02 NOTE — Op Note (Signed)
NAME:  [name] [name], [name] [name]                  ACCOUNT NO.:  [id number]   MEDICAL RECORD NO.:  [id number]          PATIENT TYPE:  INP   LOCATION:  2901                         FACILITY:  [facility]   PHYSICIAN:  Doylene Canning. Ladona Ridgel, MD    DATE OF BIRTH:  [birth year]   DATE OF PROCEDURE:  DATE OF DISCHARGE:                               OPERATIVE REPORT   PROCEDURE PERFORMED:  ICD removal and insertion of a new device with  disconnection of the previously implanted rate sense pacing lead which  was failing to capture and with re-utilization of the ICD rate sense  lead.   OPERATIVE INDICATIONS:  The patient is a 77 year old woman with complete  heart block developed in 2002.  She has ischemic heart disease with  preserved LV function.  The patient, after a pacemaker was placed for  complete heart block, developed syncope and was found to have  polymorphus ventricular tachycardia and with catheterization  demonstrating no obvious culprit.  Her defibrillator was implanted and  she did well initially, but had episode of syncope today and was found  to have noise on her rate sense lead resultant in failure to pace.  The  patient was admitted to the hospital where she was found to have an  orbital fracture.  During telemetry observation, she was found to have  going into periods of complete heart block with pauses up to 6 seconds.  Because of her lack of ventricular response, she is now referred for ICD  revision.  Because the patient's initial ICD sense lead was not being  utilized, she is scheduled to have this lead utilized today with capping  of the old pacemaker rate sense lead.   PROCEDURE:  After informed consent was obtained, the patient was taken  to diagnostic EP lab in a fasting state.  After usual preparation and  draping, intravenous fentanyl and midazolam was given for sedation.  A  30 mL of Lidocaine was infiltrated in the left infraclavicular region.  A 9-cm incision was carried out over this  region and electrocautery was  utilized to dissect down to the fascial plane.  The pocket was entered  very carefully.  Electrocautery was utilized even though there was very  mild inhibition of the pacing.  The old defibrillator which was a  guidant device, which was almost at [facility] was removed and the rate sense  lead was evaluated initially.  The patient's R-waves were greater than  20, and the patient's impedance was 507 ohms and threshold of volt 0.5  milliseconds.  With these satisfactory parameters, the old defibrillator  was removed and the new Medtronic EnTrust serial #[id number] was  connected to the new defibrillator lead as well as the atrial lead and  placed back in the subcutaneous pocket.  There was a brief pause with  this.  At this point, the pocket was irrigated with kanamycin and  defibrillation threshold testing carried out.   After the patient was more deeply sedated with fentanyl and Versed, VF  was induced with a T-wave shock and a 15 J shock was delivered,  which  terminated VF and restored sinus rhythm.  At this point, no additional  defibrillation threshold testing was carried out.  The incision was  closed with 2-0 Vicryl, followed by layer of 3-0 Vicryl.  Benzoin was  painted on the skin, Steri-Strips were applied, pressure dressing was  placed, and the patient was returned to his room in satisfactory  condition.   COMPLICATIONS:  There were no immediate procedure complications.   RESULTS:  This demonstrates successful capping of the old rate sense  lead and utilization of the new rate sense lead followed by removal of  the previously implanted defibrillator and insertion of the new device  with defibrillation threshold testing.      Doylene Canning. Ladona Ridgel, MD  Electronically Signed     GWT/MEDQ  D:  08/29/2007  T:  08/30/2007  Job:  [id number]   cc:   Arturo Morton. Riley Kill, MD, [facility]

## 2010-09-02 NOTE — Assessment & Plan Note (Signed)
[facility]                         ELECTROPHYSIOLOGY OFFICE NOTE   LIVIAH, CAKE                         MRN:          [id number]  DATE:10/19/2007                            DOB:          05/07/1931    Ms. Junio returns today for followup.  She is a very pleasant woman with  a history of complete heart block as well as coronary artery disease  status post ICD insertion secondary to symptomatic ventricular  tachycardia.  She had development of ICD pacemaker lead 9 capture and  underwent revision back in May.  She returns today for followup.  She  had multiple questions about her new device and about the procedure that  she had performed, where we took the old rate sense lead from her ICD  lead and hooked up to the device, abandoning the malfunctioning  pacemaker lead.   CURRENT MEDICATIONS:  1. Plavix 75 a day.  2. Avalide 300/12.5.  3. Potassium 10 a day.  4. Toprol-XL 200 a day.  5. Folate daily.  6. Aspirin 81 mg daily.  7. Caduet 5/40 a day.   PHYSICAL EXAMINATION:  GENERAL:  She is a pleasant well-appearing woman  in no distress.  VITAL SIGNS:  Blood pressure was 130/70, the pulse was 66 and regular,  the respirations were 18, weight was 171 pounds.  NECK:  Revealed no jugular venous distention.  LUNGS:  Clear bilaterally to auscultation.  No wheezes, rales, or  rhonchi were present.  CARDIOVASCULAR:  Revealed regular rate and rhythm.  Normal S1 and S2.  ICD insertion site was healed nicely.  There was no erythema or  tenderness.  No drainage.  ABDOMEN:  Soft, nontender.  EXTREMITIES:  Demonstrated trace peripheral edema bilaterally.   IMPRESSION:  1. Ischemic heart disease.  2. Complete heart block.  3. Ventricular tachycardia.  4. Status post implantable cardioverter-defibrillator insertion with      rate sense lead failure and successful implantable cardioverter-      defibrillator lead revision.   DISCUSSION:  Overall, Ms.  Marmo is stable.  Her defibrillator appears to  be working normally.  She has had no recurrent symptoms of syncope,  lightheadedness, or dizziness.  I will see her back in the office in 3  months.      Doylene Canning. Ladona Ridgel, MD  Electronically Signed    GWT/MedQ  DD: 10/19/2007  DT: 10/20/2007  Job #: [id number]

## 2010-09-02 NOTE — Letter (Signed)
March 23, 2008    Ramon Dredge L. Juanetta Gosling, MD  [address]  [place], [place] [address]   RE:  REIANA, [name] [name]  MRN:  [id number]  /  DOB:  1931-08-28   Dear Renae Fickle,   I had the pleasure of seeing Ciearra Rufo in the office today in followup.  Cardiac-wise she seems to be getting along reasonably well.  I know that  she has had recent CT because of pulsation in her right head, exact  etiology of this remains unclear and she tells me that the CT was not  particularly revealing.  Heart-wise she thinks she is doing well.  She  asked about whether or not she could come off of Plavix today.  She and  I had a nearly 30 minute discussion on the pros and cons.   Today, on examination, the blood pressure 152/78, pulse is 68.  The lung  fields are clear and the cardiac rhythm is regular.   Overall, she is doing well from a cardiac standpoint.  Her EKG today  reveals atrial tracking with ventricular pacing.  As she is nearly 3-1/2  years from stent implantation, it would be not unreasonable to consider  stopping Plavix.  She should remain on at least a single dose aspirin.  I left the decision up to her, and we talked about it in all of  variables in great detail as she has been in the health care system for  some time.  She will let us know about her decision.  I appreciate the  opportunity sharing in her care.    Sincerely,     Arturo Morton. Riley Kill, MD, [facility]  Electronically Signed   TDS/MedQ  DD: 03/23/2008  DT: 03/24/2008  Job #: [id number]

## 2010-09-02 NOTE — Assessment & Plan Note (Signed)
[facility]                            CARDIOLOGY OFFICE NOTE   ALVERTA, CACCAMO                         MRN:          [id number]  DATE:09/15/2006                            DOB:          [birth year]    [name] [name] is in for a followup visit.  In general, she is doing pretty well.  She has a little bit of lower extremity edema that gets worse throughout  the day.  However, this is not any worse than it usually is.  She denies  any significant ongoing chest pain or shortness of breath.  Importantly,  she has not had any significant chest pain since placement of her drug  eluting stent in the posterolateral branch.   This patient's last echocardiogram was in November of this past year.  Her overall LV size was normal with estimated ejection fraction of 55-  65% with findings consistent with diastolic dysfunction.  There was a  mild transvalvular gradient of 8 mm and mild mitral regurgitation.  There was slight increase in the peak pulmonary artery pressure.  She  has been followed by Dr. Ladona Ridgel fairly closely, and has a Guidant AICD.  This was up-graded from the pacemaker previously.  She is able to get  out and about without too much difficulty.   CURRENT MEDICATIONS INCLUDE:  1. Plavix 75 mg daily.  2. Avalide 300/12.5 mg daily.  3. Caduet 5/40 q.h.s.  4. K-Dur 10 mEq daily.  5. Toprol XL 200 mg daily.  6. Calcium 600 mg b.i.d.  7. Folic acid 0.4 international units b.i.d.  8. Aspirin 81 mg daily.  9. Actonel 35 mg weekly.   PHYSICAL EXAMINATION:  She is alert and oriented, in no acute distress.  The lung fields actually are quite clear.  The blood pressure is 160/84  and the pulse is 64 with a weight of 168.  There is paradoxical  splitting of the second heart sound.  There is only trace edema.   The electrocardiogram demonstrates probable atrial ventricular pacing  with underlying left bundle morphology.   IMPRESSION:  1. Coronary artery  disease with prior coronary artery bypass graft      surgery, an ungrafted right coronary with percutaneous intervention      in the posterolateral branch.  2. Well-preserved left ventricular function, but with known      ventricular tachycardia, treated with AICD by Dr. Ladona Ridgel and      permanent pacing for intermittent heart block.  3. Hypercholesterolemia, on lipid-lowering therapy.  4. Hypertension.   RECOMMENDATIONS:  1. Continued medical therapy.  2. Continued followup with Dr. Shaune Pollack.  3. Check basic metabolic profile.  4. Return to clinic in four to six months.     Arturo Morton. Riley Kill, MD, [facility]  Electronically Signed    TDS/MedQ  DD: 09/29/2006  DT: 09/29/2006  Job #: [id number]

## 2010-09-02 NOTE — H&P (Signed)
NAME:  [name] [name], [name] [name]                  ACCOUNT NO.:  [id number]   MEDICAL RECORD NO.:  [id number]          PATIENT TYPE:  INP   LOCATION:  2901                         FACILITY:  [facility]   PHYSICIAN:  Maple Mirza, PA   DATE OF BIRTH:  Oct 04, 1931   DATE OF ADMISSION:  08/29/2007  DATE OF DISCHARGE:                              HISTORY & PHYSICAL   PRIMARY CAREGIVER:  Dr. Juanetta Gosling.   ELECTROPHYSIOLOGIST:  Dr. Ladona Ridgel.   CARDIOLOGIST:  Arturo Morton. Riley Kill, MD, [facility].   PRESENTING CIRCUMSTANCE:  I just passed out.   HISTORY OF PRESENT ILLNESS:  [name] [name] is a 77 year old female.  She has  a history of coronary artery disease.  She is status post coronary  artery bypass graft surgery in 1998.  She has cardiovascular risk  factors of hypertension and dyslipidemia.  In the past, she had  pacemaker implanted in 2002 for complete heart block.  The pacer was  upgraded to an ICD in November 2003, for a ventricular fibrillation  episode with syncope.   The patient is admitted in transfer from [facility] this afternoon.  This morning, she took a shower.  She is sure she got out of the tub and  started toweling off; without warning, she had an uncontrolled fall.  She has obvious facial trauma.  Prior to the collapse, she had no chest  pain.  No feeling of palpitations.  No dyspnea.  No dizziness.  A CT of  the head at [facility] shows no acute intracranial insult, but a frontal  scalp hematoma extending into the left eyelid.  Maxillofacial CT shows  that the left maxillary sinus has a floor fracture and the globes are  intact and there is no muscle entrapment.  The telemetry shows  alternating heart rates, either fast to slow.  It seems somewhat  dependent on the patient's movement, slow is very slow; these are  ventricular escape beats, very evident at times for a third-degree heart  block.  The faster beats are heart rate of 60, which may represent  appropriate, though intermittent device  function.  There also have been  pauses up to 6 seconds.  Interrogation of the device shows a possible  right ventricular lead fracture with failure to capture even at high  voltage.   The patient has been having dizzy spells for several weeks.  She is  always having them on the verge of syncope.  She had an office visit  with Dr. Ladona Ridgel on August 17, 2007.  The device was interrogated at that  time showing multiple episodes of NSVT, the device was 100% V pacing and  97% A pacing.  Now, the device is unable to V pace.   ALLERGIES:  CODEINE which causes nausea.   MEDICATIONS:  Include:  1. Plavix 75 mg daily.  2. Avalide 300 mg/12.5 mg daily.  3. Calcium with vitamin D 600 mg twice daily.  4. Folic acid 400 mcg twice daily.  5. Enteric-coated aspirin 81 mg daily.  6. Caduet 5/40 mg at bedtime.  7. K-Dur 10 mEq daily.  8. Toprol-XL 200 mg daily.  9. Actonel 325 mg weekly.  The patient says she is taken all of her      meds today.   PAST MEDICAL HISTORY:  1. Coronary artery disease, status post coronary artery bypass graft      surgery in 1998.  2. Pacemaker implanted in 2002, for complete heart block.  3. Pacer to ICD, November 2003 for a Vfib/syncope.  4. Hypertension.  5. Hypercholesterolemia.  6. The patient had left catheterization in February 2006, for      polymorphic ventricular tachycardia with ICD discharge.  The LIMA      to the LAD is patent.  The saphenous vein graft to the diagonal was      patent.  Sequential saphenous vein graft to ramus intermediate to      the obtuse marginal was patent.  The right coronary artery had not      been bypassed and there was a 75% stenosis at the posterolateral      branch.  This was subsequently opened by stenting in March 2006.  7. Echocardiogram, November 2007, ejection fraction 55-60%, evidence      of diastolic dysfunction.   PHYSICAL EXAMINATION:  GENERAL:  The patient is afebrile.  VITAL SIGNS:  Blood pressure 181/77, heart  rate is alternating between  40 and 80, respiration rate is 19, oxygen saturation is 100% on 2.5 L.  The patient is alert and oriented x3.  She has evidence of facial  trauma.  The left eye is swollen shut, large hematoma at the forehead,  ecchymoses encompass both orbits.  NECK:  Supple.  No carotid bruits auscultated.  No jugular venous  distention.  LUNGS:  Clear to auscultation bilaterally.  HEART:  Has variable rhythms.  A very slow ventricular escape rhythm,  pauses greater than 6 seconds, sometimes intermittent, appropriate  pacing at 60 beats a minute.  ABDOMEN:  Soft and nondistended.  Bowel sounds are present.  EXTREMITIES:  Show no evidence of edema bilaterally.  NEUROLOGIC:  Mental status is clear.  No slurred speech.  Grip 5/5  bilaterally.  No focal deficits.  The patient feels dizzy and woozy when  pacer parameters are changed during interrogation.   The patient will have:  1. Hydralazine for blood pressure control.  2. Generator change with possible wire placement.  The patient will be      n.p.o. and have IV fluids running.  The ICD is disabled at present.      The patient will continue with medications except for Toprol and      Plavix.  We will use the laboratory studies from [facility] for      today.  We will follow up with 2-D echocardiogram on Aug 30, 2007,      and an ear, nose, and throat consult on Aug 30, 2007.  The patient      and the family preferred Dr. Dorma Russell.  We will contact him on Aug 30, 2007.  Labs at [facility] on Aug 29, 2007, white cells 10.8,      hemoglobin 14.2, hematocrit 39.9, and platelets of 203.  Sodium was      141, potassium 3.8, chloride 108, carbonate 27, glucose 120, BUN is      23, creatinine 1.01, troponin I studies x1 is less than 0.05.      Maple Mirza, PA     GM/MEDQ  D:  08/29/2007  T:  08/30/2007  Job:  [id number]

## 2010-09-02 NOTE — Assessment & Plan Note (Signed)
[facility]                         ELECTROPHYSIOLOGY OFFICE NOTE   [name] [name], [name] [name]                         MRN:          [id number]  DATE:02/01/2008                            DOB:          15-Mar-1932    [name] [name] returns today for followup.  She is a very pleasant woman with  a history of ventricular tachycardia, syncope, and complete heart block  status post pacemaker insertion initially upgraded to a dual-chamber ICD  back in May.  She returns today for followup.  She has been stable.  She  denies chest pain.  She denies shortness of breath.  Previously, she was  seen by Dr. Riley Kill.  She does have mild dyspnea with exertion.   MEDICATIONS:  Today include;  1. Plavix 75 a day.  2. Avalide 300/12.5.  3. Potassium 10 a day.  4. Toprol-XL 200 a day.  5. Folate daily.  6. Caduet 5/40 a day.   PHYSICAL EXAMINATION:  GENERAL:  She is a pleasant, well-appearing woman  in no distress.  VITAL SIGNS:  Blood pressure was 140/80, the pulse 70 and regular,  respirations were 18, and the weight was 173 pounds.  NECK:  No jugular venous distention.  LUNGS:  Clear bilaterally to auscultation.  No wheezes, rales, or  rhonchi are present.  CARDIOVASCULAR:  Regular rate and rhythm.  Normal S1 and S2.  EXTREMITIES:  No edema.   Interrogation of her defibrillator demonstrates a [employer].  P-  waves were 3.  There were no R-waves secondary to complete heart block.  The impedance was 552 in the A and 408 in the V.  Threshold was 0.5 at  0.5 in the A and 1 at 0.5 in the RV.  Battery voltage was 3.19 volts.  She was 98% A-paced and 99.9% V-paced.  There were no intercurrent ICD  therapies.   IMPRESSION:  1. History of ventricular tachycardia with syncope.  2. Complete heart block.  3. Status post dual-chamber implantable cardioverter-defibrillator      insertion.   DISCUSSION:  Overall, [name] [name] is stable.  Her defibrillator is working  normally.   She will follow up with me in 1 year.  She will follow up in  our [facility] in 3 months.     Doylene Canning. Ladona Ridgel, MD  Electronically Signed   GWT/MedQ  DD: 02/01/2008  DT: 02/02/2008  Job #: [id number]

## 2010-09-02 NOTE — Assessment & Plan Note (Signed)
[facility]                            CARDIOLOGY OFFICE NOTE   [name] [name], [name] [name]                         MRN:          [id number]  DATE:11/23/2007                            DOB:          1931/10/15    [name] [name] is in for a follow-up visit.  In general, she is relatively  stable.  She feels good most of the time and her blood pressure has been  generally stable at home.  She has not been having any chest pain.  Fortunately, all of her bruises have resolved.  She had a rather  eventful early May, which time she presented with left orbital floor  fracture and hemo sinus.  This was after syncope and she had to undergo  fairly emergent pacer defibrillator revision by Dr. Ladona Ridgel.  Of note,  her echocardiogram in May 2009, did reveal normal left ventricular size  and overall systolic function with an estimated ejection fraction of 55-  65%.  There was a suggestion of hypokinesis of the periapical wall,  perhaps as seen in pacing, there was no significant valvular disease  noted.  There was a mild increase in the pulmonary pressure dilatation  and right atrial dilatation with catheter, wires across the RV.   Laboratory studies during that hospitalization did reveal a normal  hemoglobin and hematocrit, as well as platelet count.  Renal function  studies were normal at that time as was calcium.  Her cardiac troponins  were normal.   [name] [name] seems to be getting along reasonably well.  It would be helpful  for her to get blood pressure checks on a fairly regular basis to know  that we are continuing along the right past.  She is currently taking  Plavix, Avalide 300/12.5, Caduet 5/40, Actonel 35 mg weekly, aspirin 81  mg daily, folic acid 0.4 b.i.d., calcium 600 mg with D b.i.d., Toprol XL  200 daily, and K-Dur 10 mEq.   She will remain on this current regimen at the present time.  We will  see her back in Cardiology followup in about 3 months.     Arturo Morton.  Riley Kill, MD, [facility]  Electronically Signed    TDS/MedQ  DD: 12/03/2007  DT: 12/04/2007  Job #: [id number]

## 2010-09-02 NOTE — Assessment & Plan Note (Signed)
[facility]                         ELECTROPHYSIOLOGY OFFICE NOTE   DEZERAE, FREIBERGER                         MRN:          [id number]  DATE:08/17/2007                            DOB:          1931/06/11    Ms. Hietala returns today for followup.  She is a very pleasant woman with  a history of ischemic heart disease and congestive heart failure and  complete heart block and ventricular tachycardia.  She returns today for  followup.  She has been stable.  Her main complaint is that she has very  brief episodes of dizziness or lightheadedness.  Interrogation of her  defibrillator demonstrates that she had brief episodes of nonsustained  VT which are polymorphic, very rapid, which stopped spontaneously.  She  also has frequent PVCs.  The patient also complains of dyspnea with  exertion and over the last year has had increasingly frequent episodes  of shortness of breath.  Since we last saw her, she has not had any  intercurrent IC therapies.  Previously, her LV function was preserved.  I do not have a recent EF evaluation.   MEDICATIONS:  Include Plavix 75 a day, Avalide 300/12.5 daily, Caduet  5/40 a day, potassium 10 mEq a day, Toprol XL 200 a day, calcium,  folate, aspirin and Actonel.   On exam she is a pleasant woman in no acute distress.  Blood pressure was 150/76, the pulse 62 and regular, respirations were  18.  Weight was 172 pounds.  NECK:  Revealed no jugular distention.  LUNGS:  Clear bilaterally to auscultation.  No wheezes, rales or rhonchi  are present.  There is no increased work of breathing.  CARDIOVASCULAR EXAM:  Revealed a regular rate rhythm with a normal S1  and S2.  EXTREMITIES:  Demonstrated trace peripheral edema.   Interrogation of her defibrillator demonstrates a Guidant Prism 2 at [employer]  2.  Battery voltage 2.55 volts. P and R waves of 4 and 13 respectively.  There are no intercurrent IC therapies.  She had 1 deferred therapy  in  December of 2008.  Otherwise she had multiple nonsustained episodes of  VT.  She was 100% percent V paced, 97% A paced.   IMPRESSION:  1. Ischemic cardiomyopathy.  2. Congestive heart failure.  3. Complete heart block.  4. Ventricular tachycardia/ventricular fibrillation of syncope.   DISCUSSION:  Ms. Marik is stable but I am concerned about 2 things:  1)  Her symptoms of nonsustained VT as well as;  2)  Her symptoms of  congestive heart failure which seemed to have worsened.  I will plan to  see her back in June, at which time I will obtain a 2D echo and will  plan on considering her for bi-V upgrade depending on the results of her  echo and how she has done.     Doylene Canning. Ladona Ridgel, MD  Electronically Signed    GWT/MedQ  DD: 08/17/2007  DT: 08/17/2007  Job #: [id number]   cc:   Ramon Dredge L. Juanetta Gosling, M.D.

## 2010-09-05 NOTE — H&P (Signed)
NAME:  [name], [name]                            ACCOUNT NO.:  [id number]   MEDICAL RECORD NO.:  [id number]                   PATIENT TYPE:  INP   LOCATION:  A216                                 FACILITY:  [facility]   PHYSICIAN:  Gracelyn Nurse, M.D.              DATE OF BIRTH:  [birth year]   DATE OF ADMISSION:  03/03/2002  DATE OF DISCHARGE:                                HISTORY & PHYSICAL   CHIEF COMPLAINT:  Syncope.   HISTORY OF PRESENT ILLNESS:  This is a 77 year old white female who presents  after passing out at home.  She was putting up her groceries when she  suddenly felt lightheaded and then fell to the floor.  She then woke up on  the floor and noticed blood on the floor.  She does not know how long she  was out.  She felt like she woke up and was not drowsy.  She called her  friend.  Fortunately her friend was already on her way over and arrived  shortly.  Her friend stated that she was alert and appropriate when she  arrived.  She does not recall any chest pain or shortness of breath and no  other previous episodes.   PAST MEDICAL HISTORY:  1. Coronary artery disease.     a. Status post CABG.     b. Dual-lead pacemaker placed in 2002 secondary to complete heartblock.  2. Hypertension.  3. Hyperlipidemia.   ALLERGIES:  CODEINE.   CURRENT MEDICATIONS:  1. Toprol XL 200 mg q.d.  2. Hyzaar 100/25 mg q.d.  3. Enteric-coated aspirin 325 mg q.d.  4. Premarin 0.625 mg q.d.  5. Folic acid 1 mg q.d.   SOCIAL HISTORY:  She does not smoke any tobacco.  She does not drink any  alcohol.  She is widowed with two children.  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   FAMILY HISTORY:  Her mother died at age 40 of complications of heart  surgery.  Her father died at age 56 of an MI.  A brother died at age 39 of  an MI.   REVIEW OF SYMPTOMS:  As per HPI.  Other complaints are cold symptoms.   PHYSICAL EXAMINATION:  VITAL SIGNS:  Pulse 66, respirations  22.  The blood  pressure lying down is 147/51 with a pulse of 60.  The sitting blood  pressure is 164/64 and pulse 62.  The standing blood pressure is 166/74 with  a pulse of 72.  GENERAL APPEARANCE:  This is a well-nourished, white female in no acute  distress.  HEENT:  Pupils equal, round, and reactive to light.  Extraocular movements  intact.  Oral mucosa moist.  There is a small laceration on the right  occiput.  CARDIOVASCULAR:  Regular rate and rhythm.  No murmurs.  LUNGS:  Clear to auscultation.  ABDOMEN:  Soft, nontender, and nondistended.  Bowel sounds positive.  EXTREMITIES:  No edema.  NEUROLOGIC:  Cranial nerves II-XII grossly intact.  Strength is 5/5  bilaterally.  SKIN:  Moist with no rash.   LABORATORY DATA:  The EKG shows a paced rhythm and no ST-T wave  abnormalities.  CT of the brain shows a soft tissue hematoma in the right  occipital portion.  The white blood cell count was 9.2, hemoglobin 12.3, and  platelets 255.  The sodium was 142, potassium 3.6, chloride 109, CO2 28, BUN  23, creatinine 1.4, and glucose 133.  The CK was 125 and troponin I 0.01.   ASSESSMENT AND PLAN:  1. Syncope.  I doubt this is neurologic from her symptoms and from the     normal CT.  It could be cardiac.  It could be an arrhythmia or it could     be failure of the pacemaker and the fact that she is on a large dose of     beta blocker.  She recently had her pacemaker checked about two weeks ago     and it was functioning normally.  I recommended that we have this     rechecked again just in case.  This could also be vasovagal, although I     do not know what would have triggered it with the action she was taking     during the episode.  Will go ahead and admit her to telemetry for     observation, check cardiac enzymes, and recheck the pacemaker.  If she     has other symptoms, the need for further work-up may be necessary.  2. Coronary artery disease.  Will continue on her aspirin and  beta blocker.  3. Hypertension.  Will continue her on her beta blocker and Hyzaar.                                               Gracelyn Nurse, M.D.    JDJ/MEDQ  D:  03/03/2002  T:  03/03/2002  Job:  [id number]

## 2010-09-05 NOTE — Cardiovascular Report (Signed)
NAME:  [name] [name], [name] [name]                            ACCOUNT NO.:  [id number]   MEDICAL RECORD NO.:  [id number]                   PATIENT TYPE:  INP   LOCATION:  3734                                 FACILITY:  [facility]   PHYSICIAN:  Arturo Morton. Riley Kill, M.D. [facility]         DATE OF BIRTH:  May 12, 1931   DATE OF PROCEDURE:  DATE OF DISCHARGE:                              CARDIAC CATHETERIZATION   INDICATIONS FOR PROCEDURE:  The patient is a 77 year old woman who has  previously undergone revascularization surgery.  At that time, she had a  sequential saphenous vein graft placed to the intermediate and OM, saphenous  vein graft to the diagonal and an internal mammary to the LAD.  She  presented with an episode of syncope, and by telemetry monitoring from her  pacemaker it was evident that she had spontaneously aborted degenerating  ventricular tachycardia.  She was seen by Dr. Ladona Ridgel.  Ultrasound was  obtained and revealed an ejection fraction of about 45 to 50%.  She had no  prewarning symptoms.  The current study was done to assess coronary anatomy.   PROCEDURES:  1. Left heart catheterization.  2. Selective coronary arteriography.  3. Selective left ventriculography.  4. Saphenous vein graft angiography x2.  5. Selective left internal mammary angiography x1.   DESCRIPTION OF PROCEDURE:  Procedure was performed from the right femoral  artery using 6 French catheters. She tolerated the procedure well and there  were no complications.  She was sterilely prepped prior to the  catheterization study completely.  Masks were used as well as gown and glove  throughout the course of the procedure.  The room was turned over in  preparation for defibrillator implantation.   HEMODYNAMIC DATA:  1. Central aorta 139/94.  2. Left ventricle 139/17.  3. No definite gradient on pullback across the aortic valve.  There was a     lot of ectopy during the course of the pullback.   ANGIOGRAPHIC DATA:  1.  Ventriculography was performed in the RAO projection.  Other than very     mild high anterolateral hypokinesis, overall EF appeared to be well     preserved and there were not definite other wall motion abnormalities.  2. The left main coronary artery was small in caliber without critical     disease.  3. The LAD had about a 90 to 95% midstenosis and then an 80% segmental     stenosis distally.  There was an 80% middiagonal stenosis.  4. The saphenous vein graft to the diagonal appeared intact.  5. The internal mammary to the distal LAD appeared intact.  6. The AV circumflex had about a 50 to 60% proximal area of narrowing and     provided a very small posterolateral branch.  7. The saphenous vein graft to the intermediate and OM appeared intact.     There was a subbranch of the intermediate that had  about 70% narrowing.  8. The right coronary artery is a large caliber vessel.  There is about 30%     segmental plaquing of the proximal vessel.  The vessel in the midportion     is smooth throughout and then to the PDA appears smooth.  There is a     posterolateral branch without about 50 to at most 70% narrowing in a     small portion involving the posterolateral segment.   CONCLUSIONS:  1. Continued patency of the internal mammary to the LAD.  2. Continued patency of the saphenous vein grafts to the diagonal,     intermediate and obtuse marginal.  3. Moderate plaquing of the right coronary artery, subbranch of the     intermediate and proximal portion of the AV circumflex as described     above.   DISPOSITION:  Dr. Ladona Ridgel and I have reviewed the films in detail.  There is  not a definite obvious source of ischemic VT/VF.  As a result, he feels that  the implantation of permanent defibrillator is warranted.                                               Arturo Morton. Riley Kill, M.D. [facility]    TDS/MEDQ  D:  03/07/2002  T:  03/07/2002  Job:  [id number]

## 2010-09-05 NOTE — Discharge Summary (Signed)
NAME:  [name] [name], [name] [name]                            ACCOUNT NO.:  [id number]   MEDICAL RECORD NO.:  [id number]                   PATIENT TYPE:  INP   LOCATION:  5037                                 FACILITY:  [facility]   PHYSICIAN:  Dooley L. Shela Nevin, P.A.       DATE OF BIRTH:  Jan 22, 1932   DATE OF ADMISSION:  05/22/2002  DATE OF DISCHARGE:  05/26/2002                                 DISCHARGE SUMMARY   DATE OF ANTICIPATED DISCHARGE:  Anticipated discharge on 05/26/2002 to rehab  or 05/27/02 to home.   CONSULTATIONS:  Physical Medicine rehabilitation at [facility].   ADMISSION DIAGNOSES:  1. Distal tib-fib fracture right lower extremity.  2. Hypertension.  3. Hypercholesterolemia.  4. History of defibrillator implant.   DISCHARGE DIAGNOSIS:  1. Distal tib-fib fracture right lower extremity.  2. Hypertension.  3. Hypercholesterolemia.  4. History of defibrillator implant.   PROCEDURE:  Closed reduction distal tib-fib fracture, right lower extremity  with application of long-leg cast by Dr. Montez Morita.   HISTORY OF PRESENT ILLNESS:  This is a 77 year old lady who slipped on some  ice and fell fracturing her right distal tibia and fibula fracture.  She was  seen in the emergency room.  She was very reticent to undergo any surgery.  The fracture fragments were in good alignment.  Dr. Montez Morita performed closed  reduction and the application of long-leg cast.  The patient was admitted  for observation.  It was learned that the patient had no help at home.  She  lived alone, and the long-leg cast (plaster) was quite cumbersome and bulky.  We felt that she needed further physical therapy.  Even an inpatient rehab  program for her to be able to get about, care for herself safely.  She  worked Customer service manager.  However, she was very slow with her level of activity  due to the lack of upper body strength as well as coordination with the  cast.  She was kept at nonweightbearing to rest the  cast only with a cast  boot.  She found this very difficult to do.  I discussed with the physical  therapist as well as rehabilitation admission coordinator this situation.  Hopefully, she will be able to go to a short term rehabilitation here at  [facility].  However, if this is not available, then we will discharge  her home Saturday morning when her family and friends will be available to  assist her at home.  She will need some sort of around the clock care at  home for her safety.  We would certainly hate for her to get off balance and  fall, possibly injuring something else.   MEDICATIONS:  She was taking medications for her discomfort.  One Percocet  was not quite enough, two caused some nausea, so we discussed at length that  she needed to take her Robaxin on a regular basis at  q.6h.  I added Vicodin  1-2 q.4-6h. p.r.n. mild pain.  Hopefully, two Vicodin will help her  discomfort in her right leg.   Again, the cast was intact.  Neurovascular was intact at the toes.  They  were warm.  She had perfect sensation and good dorsiflexion of the great  toe.   LABORATORY DATA:  CBC with differential which was essentially within normal  limits.  She had a slightly elevated glucose at 115, BUN 26.  Otherwise,  electrolytes were completely within normal limits.  Her postreduction showed  comminuted distal tibia and fibula fractures and near anatomic alignment  read by Dr. Myles Rosenthal.  No electrocardiogram seen.   CONDITION ON DISCHARGE:  Improved, stable.   PLAN:  The patient goes to [facility] rehabilitation.  She may continue with her  rehabilitation program accelerating per her strength and abilities.  We will  continue to follow her there.  If she goes home Saturday morning, then  discharge medications are Percocet 5 mg #60 1-2 q.4-6h p.r.n. pain, Robaxin  500 mg #30 with refill 1 q.6h. p.r.n. muscle spasm, Vicodin #30 with refill  1-2 q.4-6h. p.r.n. mild pain.  Return to see Dr. Montez Morita  in two and a half to  three weeks.  Keep the right lower extremity elevated as high as possible.  Continue with her home medications, specifically one enteric coated aspirin  per day.  Should she have any marked increase in pain, swelling or  discoloration of the toes, she is to call the office immediately or go to an  emergency room.  Follow up with her family physician and any medical  problems and Dr. Riley Kill for any cardiac problems.                                               Dooley L. Bettey Costa    DLU/MEDQ  D:  05/26/2002  T:  05/26/2002  Job:  [id number]   cc:   Ronnell Guadalajara, M.D.  [address]  [place]  [place] [address]  Fax: [phone]   Arturo Morton. Riley Kill, M.D. [facility]  [address]  [place]  [place] [address]  Fax: [phone]   Oneal Deputy. Juanetta Gosling, M.D.  [address]  [place]  [place] [address]  Fax: [phone]

## 2010-09-05 NOTE — Discharge Summary (Signed)
NAME:  [name] [name], [name] [name]                  ACCOUNT NO.:  [id number]   MEDICAL RECORD NO.:  [id number]          PATIENT TYPE:  OIB   LOCATION:  6522                         FACILITY:  [facility]   PHYSICIAN:  [name] [name], P.A. LHCDATE OF BIRTH:  17-Mar-1932   DATE OF ADMISSION:  06/19/2004  DATE OF DISCHARGE:  06/20/2004                                 DISCHARGE SUMMARY   DISCHARGE DIAGNOSES:  1.  Coronary artery disease, status post TAXUS stent to the PLA2.  2.  Status post aortocoronary bypass surgery in 1998 with LIMA to left      anterior descending, saphenous vein graft to ramus intermedius and OM.  3.  Saphenous vein graft to diagonal.  4.  Preserved left ventricular function by cath in February 2006.  5.  History of high grade heart block and an episode of V-fib meriting and      upgrade to a Guidant dual-chamber ICD in 2003.  6.  Status post screening colonoscopy with no lesions seen.  7.  History of syncope, felt secondary to V-fib.  8.  Osteoarthritis.  9.  Family history of premature coronary artery disease.  10. Hypertension.  11. Hyperlipidemia.  12. Allergy or intolerance to codeine.  13. Pulmonary hypertension.   HOSPITAL COURSE:  [name] [name] is a 77 year old female with known coronary  artery disease.  She had recent polymorphic VT that was terminated by shock.  A cardiac catheterization revealed a 75% PLA lesion.  Dr. Ladona [name] and Dr.  Riley [name] reviewed the films and favored percutaneous intervention.  She was  admitted for this on June 20, 2004.   [name] [name] had a TAXUS stent placed to the PLA2 reducing the stenosis from 80%  to 0%.  She tolerated the procedure well.   The next day, she was ambulating without chest pain or shortness of breath.  She had a small amount of ecchymosis but no bruit at her cath site.  Ms.  [name] was considered stable for discharge on June 20, 2004, with outpatient  follow up arranged.   DISCHARGE INSTRUCTIONS:  Her activity level is to include no  driving for two  days and no strenuous activity for a week.  She is to stick to a low-fat  diet.  She is to call the office for problems with the cath site.  She has  an appointment with the PA for Dr. Riley [name] on March 21 at 11:00 a.m. and is  to see Dr. Juanetta [name] as needed.   DISCHARGE MEDICATIONS:  1.  Plavix 75 mg daily.  2.  Sublingual nitroglycerin p.r.n.  3.  Avalide 300/12.5 mg daily.  4.  Folic acid 0.4 mg daily.  5.  Caduet 5/40 daily.  6.  K-Dur 10 mEq daily.  7.  Toprol XL 200 mg daily.  8.  Aspirin 325 mg daily.      RB/MEDQ  D:  06/20/2004  T:  06/20/2004  Job:  [id number]   cc:   [name] [name], M.D.  [address]  [place]  [place] [address]  Fax: [phone]

## 2010-09-05 NOTE — Discharge Summary (Signed)
   NAME:  [name] [name], [name] [name]                            ACCOUNT NO.:  [id number]   MEDICAL RECORD NO.:  [id number]                   PATIENT TYPE:  INP   LOCATION:  A216                                 FACILITY:  [facility]   PHYSICIAN:  Edward L. Juanetta Gosling, M.D.             DATE OF BIRTH:  [birth year]   DATE OF ADMISSION:  03/03/2002  DATE OF DISCHARGE:  03/04/2002                                 DISCHARGE SUMMARY   DISPOSITION:  The patient is being transferred to [facility].   PROBLEMS:  1. Syncope.  2. Coronary artery occlusive disease status post bypass.  3. Hypertension.  4. Status post pacemaker.   HISTORY OF PRESENT ILLNESS:  The patient is a 77 year old who had a syncopal  episode at home.  She said that she felt suddenly lightheaded, had enough  warning to sense that she was about to pass out, and then she actually  passed out, fell to the floor.  She was brought to the emergency room.  She  noticed that she had had a headache laceration, and she was treated in the  emergency room, had sutures in her head, and then brought in for observation  because of the syncopal episode.  She did not have positive cardiac enzymes.  Her laboratory work was generally normal.  CT scan of the head showed the  head injury but otherwise negative.  Her exam shows that she does not have  postural blood pressure changes; in fact, her blood pressure went up a bit  with sitting up and standing up.  Neurologically she is intact.  Her chest  is fairly clear.  Heart is regular without gallop with a rate of about 60.  Electrocardiogram serially showed paced rhythm.  Her cardiac enzymes  serially were negative.   HOSPITAL COURSE:  She was observed, had serial cardiac enzymes, and serial  electrocardiograms.  Consultation was obtained with Dr. Myrtis Ser of [facility]  Cardiology regarding her pacemaker and seeing if we could get information  from her pacemaker about what might have been happening with her cardiac  rhythm  at the time of the syncopal episode, and he feels that she probably  will need to be transferred to [name] for possible cardiac  catheterization, possible EP study, etc.                                               Edward L. Juanetta Gosling, M.D.    ELH/MEDQ  D:  03/04/2002  T:  03/04/2002  Job:  [id number]

## 2010-09-05 NOTE — H&P (Signed)
NAME:  [name], [name]                  ACCOUNT NO.:  [id number]   MEDICAL RECORD NO.:  [id number]          PATIENT TYPE:  OIB   LOCATION:  2860                         FACILITY:  [facility]   PHYSICIAN:  Arturo Morton. Riley Kill, M.D. [facility] OF BIRTH:  04/18/1932   DATE OF ADMISSION:  06/19/2004  DATE OF DISCHARGE:                                HISTORY & PHYSICAL   CHIEF COMPLAINT:  Need for intervention.   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  To  briefly summarize, this very delightful lady underwent coronary artery  bypass graft surgery several years ago.  In 2002, she had placement of a  dual-lead pacemaker for second-degree heart block.  Some time after that,  the patient developed an episode of syncope and was found to have  ventricular tachycardia.  She was upgraded to an automatic implantable  cardiodefibrillator.  Following this, the patient did well, but she did  develop an episode of syncope and was found to have polymorphic ventricular  tachycardia.  She has subsequently undergone repeat catheterization.  This  has demonstrated a patent internal mammary to the LAD, patent vein graft to  the diagonal, and patent sequential vein graft to the intermediate and OM.  There is some distal disease.  The patient does have a posterolateral  stenosis in the distal right coronary circulation involving the  posterolateral branch.  Dr. Ladona Ridgel and I have discussed her case in  extensive detail.  Given the polymorphic nature of the ventricular  tachycardia with well-preserved left ventricular function, it was his  feeling that percutaneous intervention of the posterolateral should be  undertaken and that the potential sources of ischemia are eliminated.  The  patient has not been having a lot of chest pain, although  she does have  shortness of breath but has a history of at least moderate pulmonary  hypertension.  She is brought in today for percutaneous intervention after  risks, benefits, and alternatives have been discussed in detail.   PAST MEDICAL HISTORY:  1.  Coronary artery disease, status post coronary artery bypass graft      surgery, as defined above.  2.  Status post dual-lead pacer implant, 2002.  3.  Prior implantation of dual-chamber ICD with defibrillation, March 07, 2002.  4.  Recent cardiac catheterization demonstrating mild pulmonary      hypertension.   ALLERGIES:  CODEINE.   CURRENT MEDICATIONS:  1.  Avalide 300/12.5 mg p.o. daily.  2.  Folic acid 0.4 mg p.o. daily.  3.  Caduet 5/40 daily.  4.  K-Dur 10 mEq daily.  5.  Toprol XL 200 mg daily.  6.  Plavix 75 mg daily.  7.  Enteric coated aspirin 325 mg daily.   SOCIAL HISTORY:  The patient does not smoke or drink.  She is widowed, with  two children.  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  FAMILY HISTORY:  Her mother died at 68 during heart surgery.  Her father  died at 41 of an MI.  A brother died at age 55 of an MI.   REVIEW OF SYSTEMS:  Vision and hearing are good.  No recent GI complaints.  Otherwise negative review of systems.   EXAMINATION:  GENERAL:  Alert, oriented female in no acute distress.  TEMPERATURE:  97.4.  PULSE:  60.  RESPIRATORY RATE:  16.  BLOOD PRESSURE:  132/58.  O2 SATURATION:  97%.  WEIGHT:  175 pounds.  GENERAL:  Alert and oriented.  LUNG:  Lung fields clear to auscultation and percussion.  Second heart sound  split widely.  Systolic ejection murmur.  No diastolic murmurs appreciated.  EXTREMITIES:  Without edema.  NEUROLOGIC:  Intact.   EKG:  Atrioventricular pacing, rate 60.  Recent Cardiolite demonstrating  prior apical infarct and mild apical hypokinesis.   Recent chest x-ray, May 22, 2004:  No acute cardiopulmonary findings.   LABORATORY DATA:   Hemoglobin 13.9, hematocrit 39.7, platelet count 278.  PTT  27, INR 1.  BUN 21, creatinine 1.1, potassium 4, calcium 10.1.   IMPRESSION:  1.  Coronary artery disease, status post coronary artery bypass graft      surgery.  2.  Posterolateral stenosis of the right coronary artery.  3.  Prior implantation of dual-chamber pacemaker, with revision to      implantable cardioverter defibrillator following episode of ventricular      fibrillation.  4.  Recent episode of polymorphic ventricular tachycardia, terminated with      implantable cardioverter defibrillator shock.  5.  Moderate pulmonary hypertension.  6.  Hypertension.  7.  Hyperlipidemia.   PLAN:  Percutaneous coronary intervention to the posterolateral branch.   As previously noted, Dr. Ladona Ridgel and I have discussed this on at least two  occasions, and I have discussed it in detail in the office with the patient,  reviewing the films in detail.  We have discussed the possible options which  included  either medical therapy or an attempt at percutaneous stenting.  Given the  patient's polymorphic ventricular tachycardia, unclear mechanism of  etiology, it is the feeling that an attempt at percutaneous intervention  should be tried.  The patient is agreeable to this plan.      TDS/MEDQ  D:  06/19/2004  T:  06/19/2004  Job:  [id number]   cc:   Arturo Morton. Riley Kill, M.D. [facility]   Oneal Deputy. Juanetta Gosling, M.D.  [address]  [place]  [place] [address]  Fax: [phone]

## 2010-09-05 NOTE — Assessment & Plan Note (Signed)
[facility]                         ELECTROPHYSIOLOGY OFFICE NOTE   KIMISHA, EUNICE                         MRN:          [id number]  DATE:08/17/2006                            DOB:          July 08, 1931    Ms. Gibbard returns today for follow-up.  She is a very pleasant middle-  aged woman with a history of hypertension and coronary artery disease  status post pacemaker, secondary to complete heart block who  subsequently developed an episode of VF and syncope which terminated  spontaneously for which she underwent ICD implantation.  She has had  several recurrent episodes resulting in ICD discharges.  She returns  today for follow-up.  She denies chest pain, shortness of breath or  syncope.  She has been stable.  She did, however, fracture her lower leg  which has now healed completely.  Recent 2-D echo demonstrated preserved  LV function and no significant valvular problems.   MEDICATIONS:  1. Plavix 75 a day.  2. Avalide 300/12.5 daily.  3. Caduet 5/40 daily.  4. Potassium.  5. Metoprolol.  6. Calcium.  7. Folate.  8. Aspirin.  9. Actonel.   PHYSICAL EXAMINATION:  GENERAL APPEARANCE:  She is a pleasant, well-  appearing, obese woman in no distress.  VITAL SIGNS:  Blood pressure 179/88, pulse 75 and regular, respirations  18, weight 168 pounds.  NECK:  No jugular venous distension.  LUNGS:  Clear to auscultation bilaterally.  No wheezing, rhonchi or  rales.  CARDIOVASCULAR:  Regular rate and rhythm with normal S1 and S2.  There  was a soft S4 gallop.  EXTREMITIES:  No clubbing, cyanosis, or edema.   Interrogation of her defibrillator demonstrates a Guidant Prism model  [id number].  The P and R-waves were 3 and 6.1, respectively with impedance 5.9  in the atrium and 541 in the ventricle.  The threshold 0.6 at 0.4 in the  right atrium and 1.8 at 0.4 in the right ventricle.  The battery voltage  was 2.58 volts.  There were no intercurrent IC  therapies.   IMPRESSION:  1. Ischemic heart disease.  2. Status post syncope with ventricular fibrillation which terminated      spontaneously.  3. Hypertension.  4. Status post implantable cardioverter defibrillator insertion.  5. Complete heart block.   DISCUSSION:  Overall, Ms. Ryder is stable and her defibrillator is  working normally.  Her battery is reproaching ERI.  Her heart failure is  well controlled.  Will plan to see her back in the office in several  months.     Doylene Canning. Ladona Ridgel, MD  Electronically Signed    GWT/MedQ  DD: 08/17/2006  DT: 08/17/2006  Job #: [id number]   cc:   Ramon Dredge L. Juanetta Gosling, M.D.

## 2010-09-05 NOTE — Discharge Summary (Signed)
[place]. [facility]  Patient:    BLAKLEE, SHORES Visit Number: [id number] MRN: [id number]          Service Type: MED Location: [facility] 2920 01 Attending Physician:  Colon Branch Dictated by:   Dante Gang, M.D. Adm. Date:  12/09/2000 Disc. Date: 12/11/2000                             Discharge Summary  DATE OF BIRTH:  06/09/1931.  DISCHARGE DIAGNOSES: 1. Complete heart block, status post pacemaker placement. 2. Coronary artery disease, status post coronary artery bypass graft x 4 in    1998 with an echo showing 60% ejection fraction and normal left ventricular    function in 1998. 3. Hypertension. 4. Hypercholesterolemia.  MEDICATIONS AT DISCHARGE: 1. Enteric coated aspirin 325 mg p.o. q.d. 2. Lipitor 10 mg q.h.s. 3. Hyzaar 12.5/50 mg b.i.d. 4. Premarin 0.625 mg q.d. 5. Folic acid. 6. Calcium.  HISTORY OF PRESENT ILLNESS:  This is a 77 year old female who presented to her primary physicians office complaining of shortness of breath and not feeling well over the past few days.  An EKG at that time showed her to be in complete heart block. She was sent to the emergency room and evaluated at that time. At that time she denied any chest pain, syncope, or presyncope.  ADMISSION PHYSICAL EXAMINATION PER ADMITTING NURSE PRACTITIONER:  GENERAL APPEARANCE:  This is a well-developed 77 year old female lying in bed in no acute distress.  HEENT:  Sclerae are clear.  NECK:  Supple, nontender with on bruits.  CHEST:  Lungs are clear to auscultation bilaterally.  CARDIOVASCULAR:  Regular rate and rhythm, normal S1 and S2.  No murmurs, rubs, or gallops.  ABDOMEN:  Soft, nontender, nondistended.  EXTREMITIES:  No edema.  NEUROLOGICAL:  Alert and oriented x 3.  HOSPITAL COURSE:  The patient was admitted on December 09, 2000, and made NPO after midnight for pacemaker placement the next morning.  She had no problems overnight.  On December 10, 2000, she had a DDD pacemaker implanted without immediate complications.  She had no complaints afterwards and no problems with the pacemaker site.  The pacemaker check on December 11, 2000, showed adequate function of the pacemaker and a chest x-ray showed good lead placement with no evidence of pneumothorax or other acute disease.  The patient was discharged home on her previous medications with instructions to follow up with Dr. Ladona Ridgel in the pacemaker clinic and follow up with Arturo Morton. Riley Kill, M.D.  FOLLOW-UP INSTRUCTIONS:  The patient was instructed to continue cardiac diet and to avoid heavy lifting for several weeks and not to raise her arm above her head for about a week.  She was advised not to get the site wet for about a week. She was given an appointment in the pacemaker clinic on December 22, 2000, at 9 oclock a.m.  She was also encouraged to keep her appointment with Dr. Riley Kill on January 03, 2001.  She should have her LFTs checked at that time due to slight elevation during this hospital stay that may be secondary to Lipitor. Dictated by:   Dante Gang, M.D. Attending Physician:  Colon Branch DD:  12/11/00 TD:  12/13/00 Job: [id number]

## 2010-09-05 NOTE — Cardiovascular Report (Signed)
NAME:  [name] [name], [name] [name]                  ACCOUNT NO.:  [id number]   MEDICAL RECORD NO.:  [id number]          PATIENT TYPE:  OIB   LOCATION:  6522                         FACILITY:  [facility]   PHYSICIAN:  Arturo Morton. Riley Kill, M.D. [facility] OF BIRTH:  1931-05-29   DATE OF PROCEDURE:  06/19/2004  DATE OF DISCHARGE:                              CARDIAC CATHETERIZATION   This is Dr. Arturo Morton. Stuckey dictated cardiac catheterization report  percutaneous intervention report on Ajani C. Arlana Pouch. Medical record number  [id number]. Date of study is 06/19/2004. Carbon copy to Dr. Arturo Morton. Stuckey,  Dr. Oneal Deputy. Juanetta Gosling, the CV laboratory, Dr. Doylene Canning. Ladona Ridgel, the patient's  medical record.   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  The patient has had prior coronary bypass graft surgery. At recent  catheterization, the patient has grafts were patent. She has a modest amount  of distal disease. Her ungrafted right coronary had a high-grade stenosis in  the posterolateral branch. The patient has had a recent episode of  ventricular tachycardia which was described as polymorphic in morphology. I  discussed her case twice with Dr. Lewayne Bunting from the electrophysiologic  service. The recommendation was for percutaneous stenting of the  posterolateral branch. The case was discussed extensively and at length with  the patient and her family. Risks, benefits, alternatives were reviewed. The  patient was brought to the catheterization laboratory for further evaluation  and treatment.   PROCEDURE:  Percutaneous stenting of the posterolateral artery of the distal  right coronary.   DESCRIPTION OF PROCEDURE:  The patient was brought to the cath lab, prepped  and draped in usual fashion. Through an anterior puncture, the right femoral  artery was easily entered. A 7-French sheath was placed. Bivalirudin was  given according to protocol and ACT subsequently checked.  Anticoagulation  was adequate for percutaneous intervention. The patient was on Plavix  previously. A JR-4 guiding catheter with side holes was utilized to intubate  the right coronary. Traverse wire was taken down into the posterolateral  segment. Predilatation was done with a 8 mm x 2 mm Voyager balloon.  Following this, the vessel was stented using an 8 x 2.5 Taxus drug-eluting  stent. Post dilatation was done with a 2.5 x 8 PowerSail balloon. All of the  edges appeared to be smooth without evidence of edge dissection, and the  stent appeared to be appropriately sized at nearly perfect sizing. There was  excellent runoff into the distal vessel.   ANGIOGRAPHIC DATA:  1.  The right coronary is a fairly large caliber vessel that has not been      previously grafted. There is a posterior descending branch that has some      mild narrowing at the takeoff of the ostium.  2.  The second posterolateral artery has focal 80% stenosis leading into an      area that then bifurcates distally. Following percutaneous stenting,      this is reduced to 0% residual Luminal narrowing with a good      angiographic result.  FINAL ANGIOGRAPHIC:  Result was excellent.   CONCLUSION:  Successful percutaneous stenting of the posterolateral artery.   RECOMMENDATIONS:  We will recommend that the patient remain on Plavix at the  present time. Follow-up will be with me. A thorough discussion is ensued  with the patient's family about uncertainty as to whether this will  prevent polymorphic ventricular tachycardia and they are well aware of the  indications and discussion regarding percutaneous intervention.   This is Arturo Morton. Stuckey and that is the end of the dictation.      TDS/MEDQ  D:  06/19/2004  T:  06/20/2004  Job:  [id number]   cc:   Doylene Canning. Ladona Ridgel, M.D.   CV Laboratory   Patient's medical record   Oneal Deputy. Juanetta Gosling, M.D.  [address]  [place]  [place] [address]  Fax: [phone]

## 2010-09-05 NOTE — Assessment & Plan Note (Signed)
[facility]                              CARDIOLOGY OFFICE NOTE   [name] [name], [name] [name]                         MRN:          [id number]  DATE:03/01/2006                            DOB:          1931-12-25    [name] is in for followup. She has overall been stable. She has not been  having any ongoing chest pain although she does get some shortness of breath  with exertion. She has had her defibrillator checked and importantly she has  had virtually no chest pain.   VITAL SIGNS:  Today initially the blood pressure was 172/82, on recheck it  was 148/82 and 150/80. Pulse was 60.  LUNGS:  Lung fields are clear.  CARDIAC:  Rhythm is regular.  EXTREMITIES:  No extremity edema is noted.   The electrocardiogram demonstrates atrial tracking and ventricular pacing.   IMPRESSION:  1. Coronary artery disease status post coronary artery bypass graft      surgery.  2. Status post percutaneous stenting of the posterolateral branch.  3. History of ventricular tachycardia identified by interrogation of      pacemaker with subsequent placement of an automatic implantable      defibrillator for syncope.  4. Hypercholesterolemia.   PLAN:  1. Recheck lipid and liver profile.  2. Return to clinic in 6 months.  3. Check CMET.  4. Two-dimensional echocardiogram to reassess left ventricular function      and her question of pulmonary pressure elevation.     Arturo Morton. Riley Kill, MD, [facility]  Electronically Signed    TDS/MedQ  DD: 03/01/2006  DT: 03/01/2006  Job #: [id number]

## 2010-09-05 NOTE — Cardiovascular Report (Signed)
NAME:  [name], [name]                  ACCOUNT NO.:  [id number]   MEDICAL RECORD NO.:  [id number]          PATIENT TYPE:  OIB   LOCATION:  6501                         FACILITY:  [facility]   PHYSICIAN:  Arturo Morton. Riley Kill, M.D. [facility] OF BIRTH:  1931-04-29   DATE OF PROCEDURE:  05/27/2004  DATE OF DISCHARGE:                              CARDIAC CATHETERIZATION   INDICATIONS:  [name] [name] is a 77 year old, well known to [place]. She has had a long  history of coronary artery disease having undergone bypass surgery in 1998.  She has done clinically fairly well but subsequently developed heart block  requiring a pacemaker. She then developed ventricular tachycardia with  syncope and had a defibrillator upgrade. She had a recent episode where she  had a VF arrest and was cardioverted by a defibrillator. She subsequently  underwent a Cardiolite which demonstrated an apical defect, but the exact  etiology of this was uncertain. Based on the presentation, and the findings,  I elected to bring her in for further evaluation. She also has a history of  mild pulmonary hypertension. Risks, benefits, alternatives were discussed  with the patient. She was agreeable to proceed.   DESCRIPTION OF PROCEDURE:  The patient was brought to the catheterization  laboratory, prepped and draped in usual fashion. Through an anterior  puncture the femoral vein was entered. A 7-French sheath was placed.  The  thermodilution Swan-Ganz catheter was taken up to the inferior vena cava  where saturation was obtained. We normally obtained one the superior vena  cava, but with the defibrillator and pacing wires it was felt that was  probably judicious to avoid passing the Swan in this direction. We then  placed a Swan-Ganz into the pulmonary artery with sequential pressure  measurements.. Saturation was obtained in the pulmonary artery.  Thermodilution cardiac outputs were performed. Following this, the femoral  artery was entered. We  had some difficulty passing the wire because of  likely calcified plaque at the femoral arterial site. We then used a Smart  needle. Interestingly, there was very little in the way of arterial signal  despite entering the vessel with a Smart needle. We were able to then pass  the wire. A 4-French sheath was placed. Central aortic and left ventricular  pressures were then measured. Simultaneous wedge LV was obtained.  Ventriculography was performed in the RAO projection. A simultaneous LV RV  was also obtained before removal of Swan-Ganz catheter.  Ventriculography  was performed in the RAO projection. Following this, coronary arteriography  was performed with standard Judkins catheters. We had to use a Champ  catheter to enter the diagonal graft. Overall the patient tolerated  procedure well and there were no complications. She was taken to the holding  area in satisfactory clinical condition.  I explained the procedure results  to her son.   ANGIOGRAPHIC DATA.:  1.  Ventriculography was performed in the RAO projection. The ventricle      appears to be mildly dilated but cannot appreciate any significant      definite wall motion abnormalities.  2.  The LAD  is segmentally and diffusely diseased with 90% narrowing in the      proximal to mid vessel. Just after this, there was competitive filling      in both the diagonal system as well as the LAD.  The LAD is diffusely      diseased prior to its location of competitive filling.  3.  The internal mammary to the distal left anterior descending artery is      widely patent.  4.  The saphenous vein graft to the diagonal appears to be patent. There is      perhaps 40% narrowing at its insertion but this does not appear to be      tight  5.  The circumflex is basically occluded after the small AV circumflex.  6.  The vein graft sequentially to the intermediate and OM is widely patent.      There is a subbranch of the OM that has about a 75%  area of narrowing      but is a relatively small caliber vessel.  7.  The right coronary is a moderately large vessel was fairly smooth.  The      posterolateral branch has a focal 75% stenosis that divides distally      into two smaller branches, but courses out laterally.   HEMODYNAMIC DATA:  1.  Right atrial pressure 10.  2.  RV 45/8.  3.  Pulmonary artery 41/14, mean 25.  4.  Pulmonary capillary wedge mean 15.  5.  LV 170/70.  6.  AO 175/16  7.  Inferior vena cava saturation 74%.  8.  Pulmonary artery saturation 73%.  9.  Aortic saturation 96%  10. Fick cardiac output 4.9 liters per minute.  11. Fick cardiac index 2.7 liters per minute per meter squared.   CONCLUSION:  1.  Preserved overall left ventricular function.  2.  Continued patency of the internal mammary to the left anterior      descending.  3.  Continued patency of saphenous vein graft to the diagonal.  4.  Continued patency sequentially to the intermediate and obtuse marginal      with inferior sub branch bifurcational disease distal to the      intermediate insertion site.  5.  Progression of disease in the posterolateral artery and right coronary      artery.   DISPOSITION:  The patient will be treated with fluids.  We will bring her  back to the office on Friday to discuss the options which would include  possible percutaneous intervention.      TDS/MEDQ  D:  05/27/2004  T:  05/27/2004  Job:  [id number]   cc:   Ramon Dredge L. Juanetta Gosling, M.D.  [address]  [place]  [place] [address]  Fax: [phone]   CV Lab

## 2010-09-05 NOTE — Op Note (Signed)
[place]. [facility]  Patient:    CRISTAL, QADIR Visit Number: [id number] MRN: [id number]          Service Type: MED Location: [facility] 2920 01 Attending Physician:  Colon Branch Proc. Date: 12/10/00 Adm. Date:  12/09/2000 Disc. Date: 12/11/2000                             Operative Report  PROCEDURE PERFORMED:  Implantation of a dual-chamber pacemaker utilizing and intravenous venography.  I:  INTRODUCTION:  The patient is a very pleasant 77 year old woman with a history of coronary disease status post coronary bypass grafting in 1998 who presented to the hospital with complete heart block.  She is now referred for permanent pacemaker implantation.  II:  DESCRIPTION OF PROCEDURE:  After informed consent was obtained, the patient was taken to the diagnostic EP Lab in the fasting state.  After usual preparation and draping, intravenous fentanyl and midazolam were given for conscious sedation.  A total of 30 cc of lidocaine was infiltrated into the left infraclavicular region. Attending Physician:  Colon Branch DD:  12/10/00 TD:  12/12/00 Job: [id number]

## 2010-09-05 NOTE — Discharge Summary (Signed)
NAME:  [name], [name]                            ACCOUNT NO.:  [id number]   MEDICAL RECORD NO.:  [id number]                   PATIENT TYPE:  INP   LOCATION:  3734                                 FACILITY:  [facility]   PHYSICIAN:  Doylene Canning. Ladona Ridgel, M.D. [facility]           DATE OF BIRTH:  11/20/1931   DATE OF ADMISSION:  03/04/2002  DATE OF DISCHARGE:  03/08/2002                                 DISCHARGE SUMMARY   PRIMARY DIAGNOSIS:  Syncope.   HISTORY OF PRESENT ILLNESS:  This is a 77 year old female with a history of  coronary artery disease, status post bypass surgery.  Previously preserved  LV systolic function was admitted to the hospital with frank syncopal spell.  She had  history of high-grade heart block, status post pacemaker.  Interrogation of her pacemaker demonstrated an epinephrine of ventricular  fibrillation which occurred at the time of her syncopal episode and  apparently terminated spontaneously.  She was referred for dual chamber ICD  insertion after heart catheterization.  The patient was admitted to the  hospital and during hospitalization, had consult EP services.  Cardiac  catheterization was done which did not show any progression of her coronary  disease within her grafts.  EF was noted to be 50%.  She underwent placement  of a Guidant dual chamber ICD.  She tolerated the procedure well and had no  immediate postop complications and was discharged the following day in  stable condition.   DISCHARGE MEDICATIONS:  1. Enteric-coated aspirin 325 q.d.  2. Toprol XL 200 q.d.  3. Lipitor 10 nightly.  4. Cozaar 100 q.d.  5. Hyzaar 100/25 mg q.d.  6. Folic acid 1 q.d.  7. K-Dur 10 q.d.  8. Keflex 250 q.6h. x 5 days.  9. Calcium as before.  10.      Tylenol 1-2 tabs q.4-6h. p.r.n. pain.   ACTIVITY AND WOUND CARE:  As per pacemaker discharge sheet.  The patient was  not to drive for six months.   DIET:  Low fat, low cholesterol, low salt diet.   DISCHARGE INSTRUCTIONS:   She was to see the pacemaker clinic at [facility] on  Wednesday, March 15, 2002, at 9:15.  At that time, she was also to be  enrolled for the __________ study.  She was also to have a BMET drawn on  March 15, 2002, for a potassium level.  During hospitalization, patient  was noted to have hypokalemia which was replaced, and she was discharged on  K-Dur 10 mEq q.d.  She was to follow up with Doylene Canning. Ladona Ridgel,  M.D. in the [place] office in 2-1/2-3 months, and the office will call to  set that appointment up, and she was scheduled to see a physician assistant  at [facility] in the [facility] office March 23, 2002, at noon for follow-up  post cath check.     Chinita Pester, C.R.N.P. [facility]  Doylene Canning. Ladona Ridgel, M.D. [facility]    DS/MEDQ  D:  03/08/2002  T:  03/08/2002  Job:  [id number]   cc:   Doylene Canning. Ladona Ridgel, M.D. [facility]   Maisie Fus D. Riley Kill, M.D. [facility]   Oneal Deputy. Juanetta Gosling, M.D.  [address]  [place]  [place] [address]  Fax: [phone]

## 2010-09-05 NOTE — Group Therapy Note (Signed)
   NAME:  [name] [name], [name] [name]                            ACCOUNT NO.:  [id number]   MEDICAL RECORD NO.:  [id number]                   PATIENT TYPE:  INP   LOCATION:  A216                                 FACILITY:  [facility]   PHYSICIAN:  Edward L. Juanetta Gosling, M.D.             DATE OF BIRTH:  July 29, 1931   DATE OF PROCEDURE:  03/04/2002  DATE OF DISCHARGE:                                   PROGRESS NOTE   PROBLEM:  Syncope.   SUBJECTIVE:  The patient was admitted yesterday with an episode of syncope.  She has a history of coronary artery occlusive disease, hyperlipidemia,  hypertension, and actually had a bypass graft done in 1998.  She had a  pacemaker implanted in 2002.  Her syncopal episode was a true syncopal  episode with loss of consciousness and she actually hit her head hard enough  that she had to receive stitches in her head.  She has had no further  symptoms since then.  Her pacemaker was last checked telephonically sometime  in the last month and apparently was okay.   PHYSICAL EXAMINATION:  VITAL SIGNS:  Temperature 97, pulse 60, blood  pressure 123/50.   LABORATORY DATA:  She has what appears to be a paced rhythm by  echocardiogram.   Her potassium was slightly low this morning at 3.4.  This is being replaced.   ASSESSMENT:  She has syncope.  She has a previous episode of pacemaker  implantation.  She has coronary disease.   PLAN:  I have discussed her situation with Dr. Myrtis Ser of [facility]  and the concern is that she may have some sort of pacemaker problem but that  she might also have had a malignant arrhythmia.  She is going to probably be  transferred to Dr. Myrtis Ser sometime this weekend for full evaluation.                                               Edward L. Juanetta Gosling, M.D.    [facility]/MEDQ  D:  03/04/2002  T:  03/04/2002  Job:  [id number]

## 2010-09-05 NOTE — Consult Note (Signed)
NAME:  [name] [name], [name] [name]                            ACCOUNT NO.:  [id number]   MEDICAL RECORD NO.:  [id number]                   PATIENT TYPE:  INP   LOCATION:  3734                                 FACILITY:  [facility]   PHYSICIAN:  Doylene Canning. Ladona Ridgel, M.D. [facility]           DATE OF BIRTH:  Mar 17, 1932   DATE OF CONSULTATION:  03/06/2002  DATE OF DISCHARGE:                                   CONSULTATION   REASON FOR CONSULTATION:  Evaluation of syncope in a patient with complete  heart block status post permanent pacemaker insertion and underlying  coronary disease.   HISTORY OF PRESENT ILLNESS:  The patient is a 77 year old woman with a  history of coronary artery disease diagnosed in 1998 who subsequently  underwent bypass surgery.  She subsequently had complete heart block  diagnosed in 202 and underwent insertion of a dual chamber pacemaker.  Of  note, the patient has not ever had chest pain and her only symptoms prior to  her bypass were dyspnea.  She has otherwise done quite well with the only  major actual problem being that of hypertension.  She was in her usual state  of health until March 03, 2002, when she experienced a syncopal episode  and sought medical attention. She was transferred here for additional  evaluation.  The patient denies any previous syncopal episodes.  She has  otherwise felt fairly well.  The patient states that her episode of syncope  was associated with a very brief prodrome of dizziness.  She states that she  passed out only briefly; in fact, did not even realize she had passed out  except that she found blood on her scalp and on the ground.  She denies any  preceding chest pain, shortness of breath or palpitations.  She does note a  history of rare palpitations which are otherwise asymptomatic.  She has not  had chest pain or shortness of breath.   PAST MEDICAL HISTORY:  This is as previously noted.  She also has a history  of arthritis.   MEDICATIONS:  1.  Toprol XL 200 mg a day.  2. Hyzaar.  3. Aspirin.  4. Premarin.  5. Folate.  6. Lipitor.   FAMILY HISTORY:  This is notable for mother dying at age 61.  Father dying  at age 20 of an MI.  She had a brother who died at age 44 of an MI.   REVIEW OF SYSTEMS:  This is notable for very minimal fatigue. She denies any  fevers, chills or night sweats.  She has had no vision or hearing problems.  She denies any chest pain or shortness of breath.  She does have very rare  episodes of dyspnea on exertion. She denies peripheral edema, cough, or  wheezing.  She denies dysuria, hematuria or nocturia.  She denies weakness,  numbness, or mood problems or depression.  She denies joint  deformities.  She does have mild arthralgias and joint pain. She denies nausea, vomiting,  diarrhea or constipation.  She denies polyuria or polydipsia.   SOCIAL HISTORY:  The patient is widowed. She has two children.  She works  part-time as a Engineer, civil (consulting).  She denies tobacco or ethanol abuse.  She exercises  regularly.   PHYSICAL EXAMINATION:  GENERAL APPEARANCE:  She is a pleasant, well-  appearing obese middle-aged woman in no distress.  VITAL SIGNS:  The patient blood pressure was 100/80, pulse 60 and regular,  respiratory rate 20.  HEENT:  Normocephalic and atraumatic.  Pupils equal and round.  Oropharynx  is moist.  Sclerae are anicteric.  NECK:  No jugular venous distension.  Carotids were 1+ and symmetric.  Trachea was midline.  There was no obvious thyromegaly.  LUNGS:  Clear bilaterally to auscultation.  There were no wheezing, rhonchi  or rales.  CARDIOVASCULAR:  Regular rate and rhythm with normal S1 and S2.  I did not  appreciate any gallops.  ABDOMEN:  Soft, nontender and nondistended.  There was no organomegaly.  EXTREMITIES:  There was no clubbing, cyanosis, or edema.  There are no  rashes or petechial lesions present.  NEUROLOGIC:  She was alert and oriented x3 with cranial nerves II-XII  grossly  intact. Strength is 5/5 and symmetric.   EKG demonstrates normal sinus rhythm (presynchronous ventricular pacing).   Review of her pacemaker demonstrates an episode of nonsustained polymorphic  VT/VF occurring exactly at the time of her syncopal episode. This self-  terminated.  The ventricular rates were well over 350 beats per minute.   IMPRESSION:  1. Syncope.  2. Nonsustained polymorphic ventricular tachycardia, ventricular     fibrillation.  3. Coronary artery disease, status post bypass surgery in 1998.  4. Complete heart block.  5. Status post permanent pacemaker insertion secondary to #4.  6. Hypertension.  7. Dyslipidemia.   DISCUSSION:  I have discussed treatment options with the patient.  Because  this episode represents polymorphous VT/VF and because she has not had  angina in the past, I think we are obligated to rule out ischemia as the  cause of her symptoms and for this reason, will proceed with left heart  catheterization.  Following this, ICD insertion will likely be indicated.                                                Doylene Canning. Ladona Ridgel, M.D. [facility]    GWT/MEDQ  D:  03/06/2002  T:  03/06/2002  Job:  [id number]   cc:   Ramon Dredge L. Juanetta Gosling, M.D.  [address]  [place]  [place] [address]  Fax: [phone]   Arturo Morton. Riley Kill, M.D. [facility]   Kathrine Cords, R.N. [facility]  [address]  [place], [place] [address]  Fax: 1

## 2010-09-05 NOTE — Op Note (Signed)
[facility]  Patient:    [name] [name], [name] [name] Visit Number: [id number] MRN: [id number]          Service Type: MED Location: [facility] 2920 [id number] Attending Physician:  Colon Branch Proc. Date: 12/10/00 Adm. Date:  12/09/2000                             Operative Report  No dictation. Attending Physician:  Colon Branch DD:  12/10/00 TD:  12/10/00 Job: [id number]

## 2010-09-05 NOTE — Op Note (Signed)
NAME:  Weldin, Prabhjot C                            ACCOUNT NO.:  [id number]   MEDICAL RECORD NO.:  [id number]                   PATIENT TYPE:  AMB   LOCATION:  DAY                                  FACILITY:  [facility]   PHYSICIAN:  Lionel December, M.D.                 DATE OF BIRTH:  12-05-1931   DATE OF PROCEDURE:  06/26/2003  DATE OF DISCHARGE:                                 OPERATIVE REPORT   PROCEDURE:  Total colonoscopy.   ENDOSCOPIST:  Lionel December, M.D.   INDICATIONS:  [name] [name] is a 77 year old Caucasian female who is undergoing  screening colonoscopy.  Her risk for CRCP is deemed to be average.  The  procedure and risks were reviewed with the patient and informed consent was  obtained.   PREOPERATIVE MEDICATIONS:  Demerol 50 mg IV and Versed 5 mg IV in divided  dose.   FINDINGS:  Procedure performed in endoscopy suite.  The patient's vital  signs and O2 saturation were monitored during the procedure and remained  stable.  The patient was placed in the left lateral recumbent position and  rectal examination was performed.  No abnormality noted on external or  digital exam.   Olympus videoscope was placed in the rectum and advanced under vision into  the sigmoid colon and beyond.  Preparation was satisfactory.  The scope was  passed to the cecum which was identified by appendiceal orifice and the  ileocecal valve.  Pictures were taken for the record.  As the scope was  withdrawn the colonic mucosa was carefully examined and was normal  throughout.  Rectal mucosa similarly was normal.   The scope was retroflexed to examine the anorectal junction and small  hemorrhoids were noted below the dentate line.  The endoscope was  straightened and withdrawn.  The patient tolerated the procedure well.   FINAL DIAGNOSES:  Small external hemorrhoids, otherwise normal colonoscopy.   RECOMMENDATIONS:  She will resume her usual meds.  She should continue  yearly Hemoccults and she will not need  another screening exam for 10 years.  Please note that her defibrillator which was turned off for the procedure  will be turned on before she leaves the facility.      ___________________________________________                                            Lionel December, M.D.   NR/MEDQ  D:  06/26/2003  T:  06/26/2003  Job:  [id number]   cc:   Ramon Dredge L. Juanetta Gosling, M.D.  [address]  [place]  [place] [address]  Fax: [phone]

## 2010-09-05 NOTE — Op Note (Signed)
NAME:  [name] [name], [name] [name]                            ACCOUNT NO.:  [id number]   MEDICAL RECORD NO.:  [id number]                   PATIENT TYPE:  INP   LOCATION:  3734                                 FACILITY:  [facility]   PHYSICIAN:  Doylene Canning. Ladona Ridgel, M.D. [facility]           DATE OF BIRTH:  [birth year]   DATE OF PROCEDURE:  03/07/2002  DATE OF DISCHARGE:                                 OPERATIVE REPORT   PROCEDURE PERFORMED:  Removal of previously implanted dual chamber pacemaker  followed by the insertion of a dual chamber ICD with defibrillation  threshold and lead testing.   INTRODUCTION:  The patient is a very pleasant 77 year old woman with history  of  coronary artery disease status post bypass surgery previously with  preserved LV systolic function. She was admitted to the hospital with a  frank syncopal spell.  She has a history of  high grade heart block and is  status post pacemaker. Interrogation of her pacemaker demonstrated an  episode of ventricular fibrillation which occurred at the time of her  syncopal episode and apparently terminated spontaneously. She was now  referred for for dual chamber ICD insertion after her heart catheterization  did not show any significant progression of her coronary disease with patent  grafts.   PROCEDURE:  After informed consent was obtained, the patient was taken to  the diagnostic catheterization lab in a fasting state. After the usual  preparation and draping, intravenous Fentanyl and midazolam were given for  sedation. A total of 30 cc lidocaine was infiltrated into the left  infraclavicular region.  A 10 cm incision was carried over this region and  electrocautery utilized to dissect down to the subpectoralis fascia.  The  pocket was subsequently entered and the previously implanted Guidant  dual  chamber pacemaker was disconnected from the atrial and ventricular pacing  leads. The patient had an underlying ventricular escape of approximately  30  beats per minute.  Pacing was carried in the old ventricular lead and this  demonstrated R-waves of approximately 18 millivolts. The ventricular  threshold was 0.9 volts and 0.8 msec. The patient's impedance in the  ventricle was 444 ohms. With the previously implanted ventricular lead  demonstrated to be working satisfactorily, the defibrillation lead which was  a Guidant [id number] lead was advanced by way of the left subclavian vein  which had been previously punctured down into the right ventricle.  At the  RV apex, the lead was actually fixed.  The R-wave measured again 18  millivolts and the patient's impedance was 620 ohms with the pacing  threshold 0.3 volts, 0.5 msec.  The patient did not result in diaphragmatic  stimulation.  With the ventricular lead in satisfactory position, it was  secured to the subpectorals fascia with a figure-of-eight suture. In  addition, sewing sleeve was secured with silk suture. Electrocautery was  then utilized to extend the subcutaneous pocket.  The defibrillation rate  sensing lead was then capped and then the previously implanted atrial and  ventricular pacing leads were connected into the defibrillator and the  defibrillation coils were also connected in the usual way. The  defibrillation rate sensing lead was capped.  At this point, additional  clindamycin was utilized to irrigate the incision and defibrillation testing  threshold testing carried out.   After the patient was deeply sedated with intravenous Fentanyl and  midazolam, VF was induced with a two wave shock and a 11 joule shock  terminated VF restoring sinus rhythm. Five minutes was allowed to elapse,  and a second VFT test carried out. Again, VF was induced with a two wave  shock, and 11 joule shock terminated DF restoring sinus rhythm. At this  point, no additional VFT testing was carried out, and the incision was  closed with a layer of 2-0 Vicryl followed by a layer of 3-0  Vicryl,  followed by a layer of 4-0 Vicryl. Benzoin was painted on the skin and Steri-  Strips were applied, and a pressure dressing placed. The patient was  returned to the room in satisfactory condition.   COMPLICATIONS:  There were no immediate procedural complications.   RESULTS:  This demonstrates successful implantation of a new Guidant dual  chamber ICD. In addition, it demonstrates removal of a previously implanted  Guidant dual chamber pacemaker with defibrillation threshold testing.                                               Doylene Canning. Ladona Ridgel, M.D. [facility]    GWT/MEDQ  D:  03/07/2002  T:  03/07/2002  Job:  [id number]   cc:   Arturo Morton. Riley Kill, M.D. [facility]   Oneal Deputy. Juanetta Gosling, M.D.  [address]  [place]  [place] [address]  Fax: [phone]   Kathrine Cords, R.N. [facility]  [address]  [place], [place] [address]  Fax: 1

## 2010-09-18 ENCOUNTER — Encounter: Payer: Self-pay | Admitting: Cardiology

## 2010-09-18 ENCOUNTER — Ambulatory Visit (INDEPENDENT_AMBULATORY_CARE_PROVIDER_SITE_OTHER): Payer: Medicare Other | Admitting: Cardiology

## 2010-09-18 ENCOUNTER — Telehealth: Payer: Self-pay | Admitting: Cardiology

## 2010-09-18 DIAGNOSIS — I10 Essential (primary) hypertension: Secondary | ICD-10-CM

## 2010-09-18 DIAGNOSIS — I272 Pulmonary hypertension, unspecified: Secondary | ICD-10-CM

## 2010-09-18 DIAGNOSIS — I2789 Other specified pulmonary heart diseases: Secondary | ICD-10-CM

## 2010-09-18 DIAGNOSIS — I5022 Chronic systolic (congestive) heart failure: Secondary | ICD-10-CM

## 2010-09-18 DIAGNOSIS — I2510 Atherosclerotic heart disease of native coronary artery without angina pectoris: Secondary | ICD-10-CM

## 2010-09-18 DIAGNOSIS — R0602 Shortness of breath: Secondary | ICD-10-CM

## 2010-09-18 LAB — BASIC METABOLIC PANEL
BUN: 22 mg/dL (ref 6–23)
Calcium: 9.9 mg/dL (ref 8.4–10.5)
Creatinine, Ser: 0.9 mg/dL (ref 0.4–1.2)
GFR: 68.08 mL/min (ref >60.00)
Glucose, Bld: 97 mg/dL (ref 70–99)
Sodium: 143 mEq/L (ref 135–145)

## 2010-09-18 MED ORDER — HYDROCHLOROTHIAZIDE 12.5 MG PO CAPS: 12.5000 mg | ORAL_CAPSULE | Freq: Every day | ORAL | Status: DC

## 2010-09-18 NOTE — Telephone Encounter (Signed)
Left message for pt to call back.  Per 09/18/10 office note the pt should start HCTZ 12.5mg  daily in addition to Micardis HCT.

## 2010-09-18 NOTE — Progress Notes (Signed)
HPI:  She is doing ok, but a bit more short of breath, and some swelling on exam at this point.  Denies chest pain.  Not really any change in diet.  Some slight weight gain.    Current Outpatient Prescriptions  Medication Sig Dispense Refill  . amLODipine-atorvastatin (CADUET) 5-40 MG per tablet Take 1 tablet by mouth daily.        [name] aspirin 81 MG tablet Take 81 mg by mouth daily.        . calcium carbonate 200 MG capsule Take 250 mg by mouth 2 (two) times daily with a meal.        . Cholecalciferol (VITAMIN D) 2000 UNITS CAPS Take 1 capsule by mouth daily.        . clopidogrel (PLAVIX) 75 MG tablet Take 75 mg by mouth daily.        . folic acid (FOLVITE) 1 MG tablet Take 1 mg by mouth 2 (two) times daily.       . metoprolol (TOPROL-XL) 200 MG 24 hr tablet Take 200 mg by mouth daily.        . potassium chloride (KLOR-CON) 10 MEQ CR tablet Take 10 mEq by mouth daily.        [name] telmisartan-hydrochlorothiazide (MICARDIS HCT) 80-12.5 MG per tablet Take 1 tablet by mouth daily.          Allergies  Allergen Reactions  . Codeine     REACTION: Nausea    Past Medical History  Diagnosis Date  . Osteoarthritis (arthritis due to wear and tear of joints)   . Pulmonary hypertension   . Hyperlipidemia   . CAD (coronary artery disease)   . Hypertension   . CHF (congestive heart failure)   . Ischemic heart disease   . Heart block   . Syncope   . Ventricular tachycardia     Past Surgical History  Procedure Date  . Coronary angioplasty with stent placement   . Coronary artery bypass graft     times 4  . Cardiac defibrillator placement     No family history on file.  History   Social History  . Marital Status: Widowed    Spouse Name: N/A    Number of Children: 2  . Years of Education: N/A   Occupational History  . retired     Designer, jewellery   Social History Main Topics  . Smoking status: Never Smoker   . Smokeless tobacco: Never Used  . Alcohol Use: No  . Drug Use: No  .  Sexually Active: Not on file   Other Topics Concern  . Not on file   Social History Narrative  . No narrative on file    ROS: Please see the HPI.  All other systems reviewed and negative.  PHYSICAL EXAM:  BP 160/74  Pulse 63  Resp 20  Ht 5\' 2"  (1.575 m)  Wt 174 lb (78.926 kg)  BMI 31.83 kg/m2  General: Well developed, well nourished, in no acute distress. Head:  Normocephalic and atraumatic. Neck: ? JVD.  Hard to tell.   Lungs: Clear to auscultation and percussion. Heart: Normal S1 and S2.  Minimal apical murmur Abdomen:  Normal bowel sounds; soft; non tender; no organomegaly Pulses: Pulses normal in all 4 extremities. Extremities: No clubbing or cyanosis. 1-2 LE edema.  Neurologic: Alert and oriented x 3.  EKG:  A track.  V pace.  PVC.    ASSESSMENT AND PLAN:

## 2010-09-18 NOTE — Assessment & Plan Note (Signed)
Some elevated.  Could be associated with some degree of diastolic heart failure.  Will repeat echo, and add HCTZ 12.5 to current regimen.  Check BMET as part of all of this.  Get BNP as well.

## 2010-09-18 NOTE — Patient Instructions (Addendum)
Your physician recommends that you schedule a follow-up appointment in: 3 weeks with Dr Cyndie Mull same day as appointment  Your physician has requested that you have an echocardiogram. Echocardiography is a painless test that uses sound waves to create images of your heart. It provides your doctor with information about the size and shape of your heart and how well your heart's chambers and valves are working. This procedure takes approximately one hour. There are no restrictions for this procedure.  Your physician has recommended you make the following change in your medication: start HCTZ 12.5mg  daily in addition to all other medications  Your physician recommends that you return for lab work today  BMP and BNP

## 2010-09-18 NOTE — Telephone Encounter (Signed)
Pt states dr Riley Kill was going to give her new med. Pt has question re her meds.

## 2010-09-18 NOTE — Assessment & Plan Note (Signed)
Followed by Dr. Juanetta Gosling.  On 80mg  atorvastatin.

## 2010-09-18 NOTE — Assessment & Plan Note (Signed)
No current symptoms at present.  Continue meds.

## 2010-09-18 NOTE — Assessment & Plan Note (Signed)
Not really primary, prob secondary to diastolic findings.  Will recheck echocardiogram, add diuretic, and see back in early follow up.

## 2010-09-19 NOTE — Telephone Encounter (Signed)
Left message for pt to call back  °

## 2010-09-19 NOTE — Telephone Encounter (Signed)
Pt will be home until 10am then she will be out doing meals on wheels for a while but she needs to talk to you about medication she needs to start pls call [phone]

## 2010-09-22 ENCOUNTER — Telehealth: Payer: Self-pay | Admitting: Cardiology

## 2010-09-22 DIAGNOSIS — I2510 Atherosclerotic heart disease of native coronary artery without angina pectoris: Secondary | ICD-10-CM

## 2010-09-22 DIAGNOSIS — I10 Essential (primary) hypertension: Secondary | ICD-10-CM

## 2010-09-22 DIAGNOSIS — I5022 Chronic systolic (congestive) heart failure: Secondary | ICD-10-CM

## 2010-09-22 MED ORDER — HYDROCHLOROTHIAZIDE 12.5 MG PO CAPS: 12.5000 mg | ORAL_CAPSULE | Freq: Every day | ORAL | Status: DC

## 2010-09-22 NOTE — Telephone Encounter (Signed)
Pt returns call from Lauren about HCTZ prescription. Prescription sent in to [facility]

## 2010-09-22 NOTE — Telephone Encounter (Signed)
Pt needs to get Rx and she is returning call to lauren

## 2010-09-26 NOTE — Telephone Encounter (Signed)
This pt called back and a new telephone note was opened on 09/22/10.  This message was handled by Lisabeth Devoid RN.

## 2010-09-29 ENCOUNTER — Encounter: Payer: Self-pay | Admitting: *Deleted

## 2010-10-09 ENCOUNTER — Ambulatory Visit (HOSPITAL_COMMUNITY): Payer: Medicare Other | Attending: Cardiology | Admitting: Radiology

## 2010-10-09 ENCOUNTER — Encounter: Payer: Self-pay | Admitting: Cardiology

## 2010-10-09 ENCOUNTER — Ambulatory Visit (INDEPENDENT_AMBULATORY_CARE_PROVIDER_SITE_OTHER): Payer: Medicare Other | Admitting: Cardiology

## 2010-10-09 DIAGNOSIS — I079 Rheumatic tricuspid valve disease, unspecified: Secondary | ICD-10-CM | POA: Insufficient documentation

## 2010-10-09 DIAGNOSIS — I10 Essential (primary) hypertension: Secondary | ICD-10-CM

## 2010-10-09 DIAGNOSIS — E785 Hyperlipidemia, unspecified: Secondary | ICD-10-CM | POA: Insufficient documentation

## 2010-10-09 DIAGNOSIS — R609 Edema, unspecified: Secondary | ICD-10-CM | POA: Insufficient documentation

## 2010-10-09 DIAGNOSIS — I2510 Atherosclerotic heart disease of native coronary artery without angina pectoris: Secondary | ICD-10-CM

## 2010-10-09 DIAGNOSIS — I080 Rheumatic disorders of both mitral and aortic valves: Secondary | ICD-10-CM | POA: Insufficient documentation

## 2010-10-09 DIAGNOSIS — R0600 Dyspnea, unspecified: Secondary | ICD-10-CM

## 2010-10-09 DIAGNOSIS — I272 Pulmonary hypertension, unspecified: Secondary | ICD-10-CM

## 2010-10-09 DIAGNOSIS — I2789 Other specified pulmonary heart diseases: Secondary | ICD-10-CM

## 2010-10-09 DIAGNOSIS — I259 Chronic ischemic heart disease, unspecified: Secondary | ICD-10-CM

## 2010-10-09 DIAGNOSIS — R0989 Other specified symptoms and signs involving the circulatory and respiratory systems: Secondary | ICD-10-CM

## 2010-10-09 DIAGNOSIS — R0609 Other forms of dyspnea: Secondary | ICD-10-CM | POA: Insufficient documentation

## 2010-10-09 DIAGNOSIS — I472 Ventricular tachycardia: Secondary | ICD-10-CM

## 2010-10-09 DIAGNOSIS — I5022 Chronic systolic (congestive) heart failure: Secondary | ICD-10-CM

## 2010-10-09 NOTE — Patient Instructions (Signed)
Your physician recommends that you schedule a follow-up appointment in:  2 months with Dr. Riley Kill. A chest x-ray takes a picture of the organs and structures inside the chest, including the heart, lungs, and blood vessels. This test can show several things, including, whether the heart is enlarges; whether fluid is building up in the lungs; and whether pacemaker / defibrillator leads are still in place.Please have done at [facility]. You have prescription this.

## 2010-10-20 ENCOUNTER — Telehealth: Payer: Self-pay | Admitting: Cardiology

## 2010-10-20 ENCOUNTER — Ambulatory Visit (HOSPITAL_COMMUNITY)
Admission: RE | Admit: 2010-10-20 | Discharge: 2010-10-20 | Disposition: A | Discharge status: Home / Self Care | Payer: Medicare Other | Source: Ambulatory Visit | Attending: Cardiology | Admitting: Cardiology

## 2010-10-20 ENCOUNTER — Other Ambulatory Visit: Payer: Self-pay | Admitting: Cardiology

## 2010-10-20 DIAGNOSIS — R0602 Shortness of breath: Secondary | ICD-10-CM | POA: Insufficient documentation

## 2010-10-20 DIAGNOSIS — I2510 Atherosclerotic heart disease of native coronary artery without angina pectoris: Secondary | ICD-10-CM

## 2010-10-20 DIAGNOSIS — I10 Essential (primary) hypertension: Secondary | ICD-10-CM | POA: Insufficient documentation

## 2010-10-20 DIAGNOSIS — Z951 Presence of aortocoronary bypass graft: Secondary | ICD-10-CM | POA: Insufficient documentation

## 2010-10-20 DIAGNOSIS — R0609 Other forms of dyspnea: Secondary | ICD-10-CM | POA: Insufficient documentation

## 2010-10-20 DIAGNOSIS — Z950 Presence of cardiac pacemaker: Secondary | ICD-10-CM | POA: Insufficient documentation

## 2010-10-20 DIAGNOSIS — R0989 Other specified symptoms and signs involving the circulatory and respiratory systems: Secondary | ICD-10-CM | POA: Insufficient documentation

## 2010-10-20 NOTE — Telephone Encounter (Signed)
PT AWARE OF ECHO RESULTS AND HAD CXR  DONE TODAY AND WILL HAVE  REPEAT BMET DONE THIS WEEK AT [facility] DUE TO LAB ERROR ON 10/09/10 AT [facility]

## 2010-10-20 NOTE — Telephone Encounter (Signed)
Pt tn call from [name] [name] last week -pls call any time this pm can be reached

## 2010-10-21 ENCOUNTER — Other Ambulatory Visit: Payer: Self-pay | Admitting: Cardiology

## 2010-10-21 LAB — BASIC METABOLIC PANEL
Calcium: 10.2 mg/dL (ref 8.4–10.5)
Sodium: 143 mEq/L (ref 135–145)

## 2010-10-22 NOTE — Assessment & Plan Note (Signed)
Currently on four drugs with modest, but not excellent control.  Would continue the same.

## 2010-10-22 NOTE — Progress Notes (Signed)
HPI:  [name] [name] gets some shortness of breath, but been staying about the same.  She denies new symptoms.  No chest pain at present. We reviewed her echo today in detail.  LV preserved.  RV pressure slightly increased, as before.  Only mild to minimal MR  Current Outpatient Prescriptions  Medication Sig Dispense Refill  . amLODipine-atorvastatin (CADUET) 5-40 MG per tablet Take 1 tablet by mouth daily.        [name] aspirin 81 MG tablet Take 81 mg by mouth daily.        . calcium carbonate 200 MG capsule Take 250 mg by mouth 2 (two) times daily with a meal.        . Cholecalciferol (VITAMIN D) 2000 UNITS CAPS Take 1 capsule by mouth daily.        . clopidogrel (PLAVIX) 75 MG tablet Take 75 mg by mouth daily.        . folic acid (FOLVITE) 1 MG tablet Take 1 mg by mouth 2 (two) times daily.       . hydrochlorothiazide (,MICROZIDE/HYDRODIURIL,) 12.5 MG capsule Take 1 capsule (12.5 mg total) by mouth daily.  30 capsule  11  . metoprolol (TOPROL-XL) 200 MG 24 hr tablet Take 200 mg by mouth daily.        . potassium chloride (KLOR-CON) 10 MEQ CR tablet Take 10 mEq by mouth daily.        [name] telmisartan-hydrochlorothiazide (MICARDIS HCT) 80-12.5 MG per tablet Take 1 tablet by mouth daily.          Allergies  Allergen Reactions  . Codeine     REACTION: Nausea    Past Medical History  Diagnosis Date  . Osteoarthritis (arthritis due to wear and tear of joints)   . Pulmonary hypertension   . Hyperlipidemia   . CAD (coronary artery disease)   . Hypertension   . CHF (congestive heart failure)   . Ischemic heart disease   . Heart block   . Syncope   . Ventricular tachycardia     Past Surgical History  Procedure Date  . Coronary angioplasty with stent placement   . Coronary artery bypass graft     times 4  . Cardiac defibrillator placement     No family history on file.  History   Social History  . Marital Status: Widowed    Spouse Name: N/A    Number of Children: 2  . Years of Education:  N/A   Occupational History  . retired     Designer, jewellery   Social History Main Topics  . Smoking status: Never Smoker   . Smokeless tobacco: Never Used  . Alcohol Use: No  . Drug Use: No  . Sexually Active: Not on file   Other Topics Concern  . Not on file   Social History Narrative  . No narrative on file    ROS: Please see the HPI.  All other systems reviewed and negative.  PHYSICAL EXAM:  BP 152/82  Pulse 64  Ht 5\' 1"  (1.549 m)  Wt 170 lb (77.111 kg)  BMI 32.12 kg/m2  General: Well developed, well nourished, in no acute distress. Head:  Normocephalic and atraumatic. Neck: no JVD Lungs: Clear to auscultation and percussion. Heart: Normal S1 and paradoxical S2.  No definite murmur.  Abdomen:  Normal bowel sounds; soft; non tender; no organomegaly Pulses: Pulses normal in all 4 extremities. Extremities: No clubbing or cyanosis. No edema. Neurologic: Alert and oriented x 3.  EKG:  ASSESSMENT AND PLAN:

## 2010-10-22 NOTE — Assessment & Plan Note (Signed)
Currently there is not a very substantial increase in her pressures.  They are controlled.  Would continue medical therapy as listed.

## 2010-10-22 NOTE — Assessment & Plan Note (Signed)
Followed by Dr. Ladona Ridgel and has an ICD.

## 2010-10-22 NOTE — Assessment & Plan Note (Signed)
Prefers to remain on DAPT.  Cath data is in [place].  Please visit for details.

## 2010-10-23 ENCOUNTER — Encounter: Payer: Self-pay | Admitting: Cardiology

## 2010-11-13 ENCOUNTER — Encounter: Payer: Self-pay | Admitting: Cardiology

## 2010-11-14 ENCOUNTER — Other Ambulatory Visit: Payer: Self-pay | Admitting: *Deleted

## 2010-11-14 MED ORDER — CLOPIDOGREL BISULFATE 75 MG PO TABS: 75.0000 mg | ORAL_TABLET | Freq: Every day | ORAL | Status: DC

## 2010-11-20 ENCOUNTER — Ambulatory Visit (INDEPENDENT_AMBULATORY_CARE_PROVIDER_SITE_OTHER): Payer: Medicare Other | Admitting: *Deleted

## 2010-11-20 ENCOUNTER — Other Ambulatory Visit: Payer: Self-pay | Admitting: Internal Medicine

## 2010-11-20 ENCOUNTER — Encounter: Payer: Self-pay | Admitting: Internal Medicine

## 2010-11-20 DIAGNOSIS — Z95810 Presence of automatic (implantable) cardiac defibrillator: Secondary | ICD-10-CM

## 2010-11-20 DIAGNOSIS — I472 Ventricular tachycardia: Secondary | ICD-10-CM

## 2010-11-20 DIAGNOSIS — I442 Atrioventricular block, complete: Secondary | ICD-10-CM

## 2010-11-20 LAB — REMOTE ICD DEVICE
AL AMPLITUDE: 2.7 mv
AL IMPEDENCE ICD: 560 Ohm
ATRIAL PACING ICD: 96.39 pct
BAMS-0001: 170 {beats}/min
BRDY-0004RA: 130 {beats}/min
FVT: 0
RV LEAD IMPEDENCE ICD: 384 Ohm
TOT-0001: 1
TOT-0002: 0
TOT-0006: 20090511000000
TZAT-0001ATACH: 1
TZAT-0002ATACH: NEGATIVE
TZAT-0005SLOWVT: 88 pct
TZAT-0011SLOWVT: 10 ms
TZAT-0012ATACH: 150 ms
TZAT-0013SLOWVT: 3
TZAT-0019ATACH: 6 V
TZAT-0019ATACH: 6 V
TZAT-0019FASTVT: 8 V
TZAT-0020ATACH: 1.5 ms
TZAT-0020ATACH: 1.5 ms
TZAT-0020FASTVT: 1.5 ms
TZAT-0020SLOWVT: 1.5 ms
TZON-0003ATACH: 350 ms
TZON-0003SLOWVT: 350 ms
TZON-0005SLOWVT: 12
TZST-0001ATACH: 4
TZST-0001ATACH: 5
TZST-0001FASTVT: 2
TZST-0001FASTVT: 3
TZST-0001FASTVT: 5
TZST-0001SLOWVT: 2
TZST-0001SLOWVT: 4
TZST-0001SLOWVT: 6
TZST-0002ATACH: NEGATIVE
TZST-0002FASTVT: NEGATIVE
TZST-0002FASTVT: NEGATIVE
TZST-0002FASTVT: NEGATIVE
TZST-0003SLOWVT: 35 J
TZST-0003SLOWVT: 35 J

## 2010-11-25 ENCOUNTER — Encounter: Payer: Self-pay | Admitting: *Deleted

## 2010-11-26 NOTE — Progress Notes (Signed)
icd remote check  

## 2010-12-17 ENCOUNTER — Ambulatory Visit (INDEPENDENT_AMBULATORY_CARE_PROVIDER_SITE_OTHER): Payer: Medicare Other | Admitting: Cardiology

## 2010-12-17 ENCOUNTER — Encounter: Payer: Self-pay | Admitting: Cardiology

## 2010-12-17 VITALS — BP 134/69 | HR 66 | Ht 62.0 in | Wt 173.8 lb

## 2010-12-17 DIAGNOSIS — E785 Hyperlipidemia, unspecified: Secondary | ICD-10-CM

## 2010-12-17 DIAGNOSIS — I2789 Other specified pulmonary heart diseases: Secondary | ICD-10-CM

## 2010-12-17 DIAGNOSIS — I459 Conduction disorder, unspecified: Secondary | ICD-10-CM

## 2010-12-17 DIAGNOSIS — Z95810 Presence of automatic (implantable) cardiac defibrillator: Secondary | ICD-10-CM

## 2010-12-17 DIAGNOSIS — I2510 Atherosclerotic heart disease of native coronary artery without angina pectoris: Secondary | ICD-10-CM

## 2010-12-17 DIAGNOSIS — I10 Essential (primary) hypertension: Secondary | ICD-10-CM

## 2010-12-17 NOTE — Assessment & Plan Note (Signed)
Needs lipid and liver.  Will get at Dr. Juanetta Gosling office so she can fast.

## 2010-12-17 NOTE — Progress Notes (Signed)
HPI:  Overall about the same.  No change in status.  Has some shortness of breath, but no progression of symptoms.  Feels good.  Denies new symptoms.  Not a lot of energy, though.  Goes to [facility] about three times per week, and gets some exercise.  .  Current Outpatient Prescriptions  Medication Sig Dispense Refill  . amLODipine-atorvastatin (CADUET) 5-40 MG per tablet Take 1 tablet by mouth daily.        [name] aspirin 81 MG tablet Take 81 mg by mouth daily.        . calcium carbonate 200 MG capsule Take 250 mg by mouth 2 (two) times daily with a meal.        . Cholecalciferol (VITAMIN D) 2000 UNITS CAPS Take 1 capsule by mouth daily.        . clopidogrel (PLAVIX) 75 MG tablet Take 1 tablet (75 mg total) by mouth daily.  30 tablet  3  . folic acid (FOLVITE) 1 MG tablet Take 1 mg by mouth 2 (two) times daily.       . hydrochlorothiazide (,MICROZIDE/HYDRODIURIL,) 12.5 MG capsule Take 1 capsule (12.5 mg total) by mouth daily.  30 capsule  11  . metoprolol (TOPROL-XL) 200 MG 24 hr tablet Take 200 mg by mouth daily.        . potassium chloride (KLOR-CON) 10 MEQ CR tablet Take 10 mEq by mouth daily.        [name] telmisartan-hydrochlorothiazide (MICARDIS HCT) 80-12.5 MG per tablet Take 1 tablet by mouth daily.          Allergies  Allergen Reactions  . Codeine     REACTION: Nausea    Past Medical History  Diagnosis Date  . Osteoarthritis (arthritis due to wear and tear of joints)   . Pulmonary hypertension   . Hyperlipidemia   . CAD (coronary artery disease)   . Hypertension   . CHF (congestive heart failure)   . Ischemic heart disease   . Heart block   . Syncope   . Ventricular tachycardia     Past Surgical History  Procedure Date  . Coronary angioplasty with stent placement   . Coronary artery bypass graft     times 4  . Cardiac defibrillator placement     Family History  Problem Relation Age of Onset  . Heart attack Father   . Heart attack Brother     History   Social History  .  Marital Status: Widowed    Spouse Name: N/A    Number of Children: 2  . Years of Education: N/A   Occupational History  . retired     Designer, jewellery   Social History Main Topics  . Smoking status: Never Smoker   . Smokeless tobacco: Never Used  . Alcohol Use: No  . Drug Use: No  . Sexually Active: Not on file   Other Topics Concern  . Not on file   Social History Narrative  . No narrative on file    ROS: Please see the HPI.  All other systems reviewed and negative.  PHYSICAL EXAM:  BP 134/69  Pulse 66  Ht 5\' 2"  (1.575 m)  Wt 173 lb 12.8 oz (78.835 kg)  BMI 31.79 kg/m2  General: Well developed, well nourished, in no acute distress. Head:  Normocephalic and atraumatic. Neck: no JVD Lungs: Clear to auscultation and percussion. Heart: Normal S1 and paradoxical S2.[name]  No murmur, rubs or gallops.  Pulses: Pulses normal in all 4  extremities. Extremities: No clubbing or cyanosis. No edema. Neurologic: Alert and oriented x 3.  EKG:  AV pacing, and a tracking v pacing.  No acute changes.   ASSESSMENT AND PLAN:

## 2010-12-17 NOTE — Patient Instructions (Addendum)
Your physician recommends that you schedule a follow-up appointment in: 3 MONTHS WITH DR Riley Kill Your physician recommends that you continue on your current medications as directed. Please refer to the Current Medication list given to you today.

## 2010-12-26 ENCOUNTER — Other Ambulatory Visit: Payer: Self-pay | Admitting: *Deleted

## 2010-12-26 MED ORDER — POTASSIUM CHLORIDE 10 MEQ PO TBCR: 10.0000 meq | EXTENDED_RELEASE_TABLET | Freq: Every day | ORAL | Status: DC

## 2011-01-14 LAB — BASIC METABOLIC PANEL
BUN: 23
CO2: 27
Calcium: 9.3
Calcium: 9.7
Chloride: 104
Creatinine, Ser: 1.01
GFR calc non Af Amer: 54 — ABNORMAL LOW
Glucose, Bld: 114 — ABNORMAL HIGH
Glucose, Bld: 120 — ABNORMAL HIGH
Potassium: 3.8
Sodium: 138

## 2011-01-14 LAB — DIFFERENTIAL
Basophils Absolute: 0
Basophils Relative: 0
Eosinophils Absolute: 0.1
Eosinophils Absolute: 0.2
Eosinophils Relative: 2
Lymphocytes Relative: 14
Lymphs Abs: 1.5
Monocytes Absolute: 1
Monocytes Relative: 10
Monocytes Relative: 11
Neutro Abs: 5.9
Neutrophils Relative %: 75

## 2011-01-14 LAB — POCT CARDIAC MARKERS
Myoglobin, poc: 192
Troponin i, poc: <0.05

## 2011-01-14 LAB — CBC
Hemoglobin: 14
MCHC: 34.7
MCV: 89.8
Platelets: 203
RDW: 13.6
RDW: 13.8
WBC: 10.8 — ABNORMAL HIGH

## 2011-01-19 ENCOUNTER — Encounter: Payer: Self-pay | Admitting: Internal Medicine

## 2011-01-19 ENCOUNTER — Other Ambulatory Visit: Payer: Self-pay | Admitting: Internal Medicine

## 2011-01-21 NOTE — Assessment & Plan Note (Signed)
No recurrent symptoms.

## 2011-01-21 NOTE — Assessment & Plan Note (Signed)
Currently controlled.

## 2011-01-21 NOTE — Assessment & Plan Note (Signed)
Status post pacing.

## 2011-01-21 NOTE — Assessment & Plan Note (Signed)
Probably from component of diastolic heart failure and controlled.

## 2011-01-21 NOTE — Assessment & Plan Note (Signed)
Followed by Dr. Taylor 

## 2011-02-17 ENCOUNTER — Other Ambulatory Visit: Payer: Self-pay | Admitting: Obstetrics & Gynecology

## 2011-02-17 ENCOUNTER — Other Ambulatory Visit (HOSPITAL_COMMUNITY)
Admission: RE | Admit: 2011-02-17 | Discharge: 2011-02-17 | Disposition: A | Discharge status: Home / Self Care | Payer: Medicare Other | Source: Ambulatory Visit | Attending: Obstetrics & Gynecology | Admitting: Obstetrics & Gynecology

## 2011-02-17 DIAGNOSIS — Z124 Encounter for screening for malignant neoplasm of cervix: Secondary | ICD-10-CM | POA: Insufficient documentation

## 2011-02-19 ENCOUNTER — Ambulatory Visit (INDEPENDENT_AMBULATORY_CARE_PROVIDER_SITE_OTHER): Payer: Medicare Other | Admitting: *Deleted

## 2011-02-19 DIAGNOSIS — Z95810 Presence of automatic (implantable) cardiac defibrillator: Secondary | ICD-10-CM

## 2011-02-19 DIAGNOSIS — I472 Ventricular tachycardia: Secondary | ICD-10-CM

## 2011-02-22 LAB — REMOTE ICD DEVICE
AL AMPLITUDE: 2.8 mv
AL IMPEDENCE ICD: 632 Ohm
BAMS-0001: 170 {beats}/min
BATTERY VOLTAGE: 2.96 V
BRDY-0002RA: 60 {beats}/min
BRDY-0003RA: 130 {beats}/min
CHARGE TIME: 9.839 s
RV LEAD IMPEDENCE ICD: 400 Ohm
TOT-0002: 0
TOT-0006: 20090511000000
TZAT-0001ATACH: 2
TZAT-0001FASTVT: 1
TZAT-0001SLOWVT: 1
TZAT-0002ATACH: NEGATIVE
TZAT-0002FASTVT: NEGATIVE
TZAT-0004SLOWVT: 8
TZAT-0005SLOWVT: 88 pct
TZAT-0012ATACH: 150 ms
TZAT-0012SLOWVT: 200 ms
TZAT-0013SLOWVT: 3
TZAT-0018ATACH: NEGATIVE
TZAT-0018FASTVT: NEGATIVE
TZAT-0018SLOWVT: NEGATIVE
TZAT-0019ATACH: 6 V
TZAT-0020ATACH: 1.5 ms
TZAT-0020ATACH: 1.5 ms
TZAT-0020ATACH: 1.5 ms
TZON-0003SLOWVT: 350 ms
TZON-0003VSLOWVT: 450 ms
TZON-0004SLOWVT: 20
TZON-0004VSLOWVT: 24
TZON-0005SLOWVT: 12
TZST-0001ATACH: 6
TZST-0001FASTVT: 2
TZST-0001FASTVT: 3
TZST-0001FASTVT: 4
TZST-0001FASTVT: 6
TZST-0001SLOWVT: 5
TZST-0002ATACH: NEGATIVE
TZST-0002FASTVT: NEGATIVE
TZST-0002FASTVT: NEGATIVE
TZST-0002FASTVT: NEGATIVE
TZST-0003SLOWVT: 35 J
TZST-0003SLOWVT: 35 J
VENTRICULAR PACING ICD: 99.99 pct
VF: 0

## 2011-03-06 NOTE — Progress Notes (Signed)
icd remote check  

## 2011-03-10 ENCOUNTER — Other Ambulatory Visit: Payer: Self-pay | Admitting: Cardiology

## 2011-03-13 ENCOUNTER — Encounter: Payer: Self-pay | Admitting: *Deleted

## 2011-03-19 ENCOUNTER — Encounter: Payer: Self-pay | Admitting: Cardiology

## 2011-03-19 ENCOUNTER — Ambulatory Visit (INDEPENDENT_AMBULATORY_CARE_PROVIDER_SITE_OTHER): Payer: Medicare Other | Admitting: Cardiology

## 2011-03-19 VITALS — BP 150/76 | HR 72 | Resp 18 | Ht 62.0 in | Wt 172.0 lb

## 2011-03-19 DIAGNOSIS — I2510 Atherosclerotic heart disease of native coronary artery without angina pectoris: Secondary | ICD-10-CM

## 2011-03-19 DIAGNOSIS — Z95810 Presence of automatic (implantable) cardiac defibrillator: Secondary | ICD-10-CM

## 2011-03-19 DIAGNOSIS — I2789 Other specified pulmonary heart diseases: Secondary | ICD-10-CM

## 2011-03-19 NOTE — Patient Instructions (Signed)
Your physician recommends that you schedule a follow-up appointment in: 4 MONTHS  Your physician recommends that you continue on your current medications as directed. Please refer to the Current Medication list given to you today.   

## 2011-04-15 NOTE — Progress Notes (Signed)
HPI:  Really doing quite well.  No change.  Still a little shortness of breath, but nothing progressive.  Denies any chest pain.  Discussed meds, and still wants to stay on plavix.  BP not changed much.   Current Outpatient Prescriptions  Medication Sig Dispense Refill  . amLODipine-atorvastatin (CADUET) 5-40 MG per tablet Take 1 tablet by mouth daily.        [name] aspirin 81 MG tablet Take 81 mg by mouth daily.        . calcium carbonate 200 MG capsule Take 250 mg by mouth 2 (two) times daily with a meal.        . Cholecalciferol (VITAMIN D) 2000 UNITS CAPS Take 1 capsule by mouth daily.        . folic acid (FOLVITE) 1 MG tablet Take 1 mg by mouth 2 (two) times daily.       . hydrochlorothiazide (,MICROZIDE/HYDRODIURIL,) 12.5 MG capsule Take 1 capsule (12.5 mg total) by mouth daily.  30 capsule  11  . metoprolol (TOPROL-XL) 200 MG 24 hr tablet Take 200 mg by mouth daily.        [name] PLAVIX 75 MG tablet TAKE ONE TABLET BY MOUTH ONCE DAILY.  30 each  6  . potassium chloride (KLOR-CON) 10 MEQ CR tablet Take 1 tablet (10 mEq total) by mouth daily.  30 tablet  6  . telmisartan-hydrochlorothiazide (MICARDIS HCT) 80-12.5 MG per tablet Take 1 tablet by mouth daily.          Allergies  Allergen Reactions  . Codeine     REACTION: Nausea    Past Medical History  Diagnosis Date  . Osteoarthritis (arthritis due to wear and tear of joints)   . Pulmonary hypertension   . Hyperlipidemia   . CAD (coronary artery disease)   . Hypertension   . CHF (congestive heart failure)   . Ischemic heart disease   . Heart block   . Syncope   . Ventricular tachycardia     Past Surgical History  Procedure Date  . Coronary angioplasty with stent placement   . Coronary artery bypass graft     times 4  . Cardiac defibrillator placement     Family History  Problem Relation Age of Onset  . Heart attack Father   . Heart attack Brother     History   Social History  . Marital Status: Widowed    Spouse  Name: N/A    Number of Children: 2  . Years of Education: N/A   Occupational History  . retired     Designer, jewellery   Social History Main Topics  . Smoking status: Never Smoker   . Smokeless tobacco: Never Used  . Alcohol Use: No  . Drug Use: No  . Sexually Active: Not on file   Other Topics Concern  . Not on file   Social History Narrative  . No narrative on file    ROS: Please see the HPI.  All other systems reviewed and negative.  PHYSICAL EXAM:  BP 150/76  Pulse 72  Resp 18  Ht 5\' 2"  (1.575 m)  Wt 78.019 kg (172 lb)  BMI 31.46 kg/m2  General: Well developed, well nourished, in no acute distress. Head:  Normocephalic and atraumatic. Neck: no JVD Lungs: Clear to auscultation and percussion. Heart: Normal S1 and S2.  Minimal SEM.  No DM.  Abdomen:  Normal bowel sounds; soft; non tender; no organomegaly Pulses: Pulses normal in all 4 extremities.  Extremities: No clubbing or cyanosis. No edema. Neurologic: Alert and oriented x 3.  EKG:  Atrial tracking and ventricular pacing.    ASSESSMENT AND PLAN:

## 2011-04-15 NOTE — Assessment & Plan Note (Signed)
Followed by Dr. Ladona Ridgel.  No syncope or presyncope.

## 2011-04-15 NOTE — Assessment & Plan Note (Signed)
She continues to remain stable at the present time.  No angina.  While we could likely stop clopidogrel, she would like to continue at present.  Nervous about stopping.

## 2011-04-15 NOTE — Assessment & Plan Note (Signed)
Overall LV is preserved.  Has modest pulm HTN, but suspect most of that is from diastolic abnormalities.

## 2011-05-28 ENCOUNTER — Encounter: Payer: Self-pay | Admitting: Internal Medicine

## 2011-05-28 ENCOUNTER — Ambulatory Visit (INDEPENDENT_AMBULATORY_CARE_PROVIDER_SITE_OTHER): Payer: Medicare Other | Admitting: *Deleted

## 2011-05-28 DIAGNOSIS — Z95810 Presence of automatic (implantable) cardiac defibrillator: Secondary | ICD-10-CM

## 2011-05-28 DIAGNOSIS — I259 Chronic ischemic heart disease, unspecified: Secondary | ICD-10-CM

## 2011-05-29 LAB — REMOTE ICD DEVICE
ATRIAL PACING ICD: 97.07 pct
BRDY-0004RA: 130 {beats}/min
CHARGE TIME: 10.149 s
FVT: 0
PACEART VT: 0
RV LEAD IMPEDENCE ICD: 376 Ohm
TOT-0001: 1
TZAT-0001ATACH: 2
TZAT-0002ATACH: NEGATIVE
TZAT-0004SLOWVT: 8
TZAT-0012ATACH: 150 ms
TZAT-0012ATACH: 150 ms
TZAT-0012FASTVT: 200 ms
TZAT-0012SLOWVT: 200 ms
TZAT-0013SLOWVT: 3
TZAT-0018ATACH: NEGATIVE
TZAT-0018FASTVT: NEGATIVE
TZAT-0018SLOWVT: NEGATIVE
TZAT-0019ATACH: 6 V
TZAT-0019ATACH: 6 V
TZAT-0019FASTVT: 8 V
TZAT-0020ATACH: 1.5 ms
TZAT-0020ATACH: 1.5 ms
TZAT-0020ATACH: 1.5 ms
TZAT-0020FASTVT: 1.5 ms
TZAT-0020SLOWVT: 1.5 ms
TZON-0003ATACH: 350 ms
TZON-0003VSLOWVT: 450 ms
TZST-0001FASTVT: 5
TZST-0001FASTVT: 6
TZST-0001SLOWVT: 2
TZST-0001SLOWVT: 4
TZST-0001SLOWVT: 5
TZST-0002ATACH: NEGATIVE
TZST-0002FASTVT: NEGATIVE
TZST-0002FASTVT: NEGATIVE
TZST-0003SLOWVT: 35 J
TZST-0003SLOWVT: 35 J

## 2011-06-05 NOTE — Progress Notes (Signed)
ICD remote 

## 2011-06-17 ENCOUNTER — Encounter: Payer: Self-pay | Admitting: *Deleted

## 2011-07-16 ENCOUNTER — Ambulatory Visit (INDEPENDENT_AMBULATORY_CARE_PROVIDER_SITE_OTHER): Payer: Medicare Other | Admitting: Cardiology

## 2011-07-16 ENCOUNTER — Encounter: Payer: Self-pay | Admitting: Cardiology

## 2011-07-16 VITALS — BP 140/72 | HR 70 | Ht 61.0 in | Wt 171.1 lb

## 2011-07-16 DIAGNOSIS — I472 Ventricular tachycardia, unspecified: Secondary | ICD-10-CM

## 2011-07-16 DIAGNOSIS — R0602 Shortness of breath: Secondary | ICD-10-CM

## 2011-07-16 DIAGNOSIS — J40 Bronchitis, not specified as acute or chronic: Secondary | ICD-10-CM

## 2011-07-16 DIAGNOSIS — I10 Essential (primary) hypertension: Secondary | ICD-10-CM

## 2011-07-16 DIAGNOSIS — I2510 Atherosclerotic heart disease of native coronary artery without angina pectoris: Secondary | ICD-10-CM

## 2011-07-16 LAB — BASIC METABOLIC PANEL
Chloride: 101 mEq/L (ref 96–112)
GFR: 62.07 mL/min (ref >60.00)
Glucose, Bld: 112 mg/dL — ABNORMAL HIGH (ref 70–99)
Potassium: 3.5 mEq/L (ref 3.5–5.1)
Sodium: 140 mEq/L (ref 135–145)

## 2011-07-16 NOTE — Assessment & Plan Note (Signed)
Probably secondary to mild diastolic heart failure.

## 2011-07-16 NOTE — Progress Notes (Signed)
HPI:  She is stable.  Of note, she had a chest cold and got Ceftin.  She is doing better.  She does get some shortness of breath when going up hill, but otherwise that is unchanged.  We reviewed her echo results which show some pulm htn, and partly likely due to diastolic dysfunction.  Overall functional status remains good.    Current Outpatient Prescriptions  Medication Sig Dispense Refill  . amLODipine-atorvastatin (CADUET) 5-40 MG per tablet Take 1 tablet by mouth daily.        [name] aspirin 81 MG tablet Take 81 mg by mouth daily.        . calcium carbonate 200 MG capsule Take 400 mg by mouth 2 (two) times daily with a meal.       . Cholecalciferol (VITAMIN D) 2000 UNITS CAPS Take 1 capsule by mouth daily.        . folic acid (FOLVITE) 1 MG tablet Take 400 mcg by mouth 2 (two) times daily.       . hydrochlorothiazide (,MICROZIDE/HYDRODIURIL,) 12.5 MG capsule Take 1 capsule (12.5 mg total) by mouth daily.  30 capsule  11  . metoprolol (TOPROL-XL) 200 MG 24 hr tablet Take 200 mg by mouth daily.        [name] PLAVIX 75 MG tablet TAKE ONE TABLET BY MOUTH ONCE DAILY.  30 each  6  . potassium chloride (KLOR-CON) 10 MEQ CR tablet Take 1 tablet (10 mEq total) by mouth daily.  30 tablet  6  . telmisartan-hydrochlorothiazide (MICARDIS HCT) 80-12.5 MG per tablet Take 1 tablet by mouth daily.          Allergies  Allergen Reactions  . Codeine     REACTION: Nausea    Past Medical History  Diagnosis Date  . Osteoarthritis (arthritis due to wear and tear of joints)   . Pulmonary hypertension   . Hyperlipidemia   . CAD (coronary artery disease)   . Hypertension   . CHF (congestive heart failure)   . Ischemic heart disease   . Heart block   . Syncope   . Ventricular tachycardia     Past Surgical History  Procedure Date  . Coronary angioplasty with stent placement   . Coronary artery bypass graft     times 4  . Cardiac defibrillator placement     Family History  Problem Relation Age of Onset   . Heart attack Father   . Heart attack Brother     History   Social History  . Marital Status: Widowed    Spouse Name: N/A    Number of Children: 2  . Years of Education: N/A   Occupational History  . retired     Designer, jewellery   Social History Main Topics  . Smoking status: Never Smoker   . Smokeless tobacco: Never Used  . Alcohol Use: No  . Drug Use: No  . Sexually Active: Not on file   Other Topics Concern  . Not on file   Social History Narrative  . No narrative on file    ROS: Please see the HPI.  All other systems reviewed and negative.  PHYSICAL EXAM:  BP 140/72  Pulse 70  Ht 5\' 1"  (1.549 m)  Wt 171 lb 1.9 oz (77.62 kg)  BMI 32.33 kg/m2  General: Well developed, well nourished, in no acute distress. Head:  Normocephalic and atraumatic. Neck: no JVD Lungs: Clear to auscultation and percussion. Heart: Normal S1 and S2.  Abdomen:  Normal bowel sounds; soft; non tender; no organomegaly Pulses: Pulses normal in all 4 extremities. Extremities: No clubbing or cyanosis. No edema. Neurologic: Alert and oriented x 3.  EKG: NSR.  V pacing.  PACs.   ASSESSMENT AND PLAN:

## 2011-07-16 NOTE — Patient Instructions (Signed)
Your physician wants you to follow-up in: 4 MONTHS. You will receive a reminder letter in the mail two months in advance. If you don't receive a letter, please call our office to schedule the follow-up appointment.  Your physician recommends that you have lab work today: [facility]  Your physician recommends that you continue on your current medications as directed. Please refer to the Current Medication list given to you today.

## 2011-07-20 ENCOUNTER — Other Ambulatory Visit: Payer: Self-pay

## 2011-07-20 MED ORDER — POTASSIUM CHLORIDE CRYS ER 20 MEQ PO TBCR: 20.0000 meq | EXTENDED_RELEASE_TABLET | Freq: Every day | ORAL | Status: DC

## 2011-08-07 ENCOUNTER — Other Ambulatory Visit (HOSPITAL_COMMUNITY): Payer: Self-pay | Admitting: Pulmonary Disease

## 2011-08-07 DIAGNOSIS — Z139 Encounter for screening, unspecified: Secondary | ICD-10-CM

## 2011-08-11 ENCOUNTER — Ambulatory Visit (HOSPITAL_COMMUNITY)
Admission: RE | Admit: 2011-08-11 | Discharge: 2011-08-11 | Disposition: A | Discharge status: Home / Self Care | Payer: Medicare Other | Source: Ambulatory Visit | Attending: Pulmonary Disease | Admitting: Pulmonary Disease

## 2011-08-11 DIAGNOSIS — Z139 Encounter for screening, unspecified: Secondary | ICD-10-CM

## 2011-08-11 DIAGNOSIS — Z1231 Encounter for screening mammogram for malignant neoplasm of breast: Secondary | ICD-10-CM | POA: Insufficient documentation

## 2011-08-30 DIAGNOSIS — J40 Bronchitis, not specified as acute or chronic: Secondary | ICD-10-CM | POA: Insufficient documentation

## 2011-08-30 NOTE — Assessment & Plan Note (Signed)
No current angina 

## 2011-08-30 NOTE — Assessment & Plan Note (Signed)
Currently controlled.

## 2011-08-30 NOTE — Assessment & Plan Note (Signed)
Got Ceftin and seems to be resolving.  Still not up to par yet.

## 2011-08-30 NOTE — Assessment & Plan Note (Signed)
Follows with Dr. Ladona Ridgel.

## 2011-09-21 ENCOUNTER — Ambulatory Visit (INDEPENDENT_AMBULATORY_CARE_PROVIDER_SITE_OTHER): Payer: Medicare Other | Admitting: Internal Medicine

## 2011-09-21 ENCOUNTER — Encounter: Payer: Self-pay | Admitting: Internal Medicine

## 2011-09-21 VITALS — BP 150/77 | HR 73 | Resp 18 | Ht 61.0 in | Wt 172.0 lb

## 2011-09-21 DIAGNOSIS — I2510 Atherosclerotic heart disease of native coronary artery without angina pectoris: Secondary | ICD-10-CM

## 2011-09-21 DIAGNOSIS — Z95810 Presence of automatic (implantable) cardiac defibrillator: Secondary | ICD-10-CM

## 2011-09-21 DIAGNOSIS — I4891 Unspecified atrial fibrillation: Secondary | ICD-10-CM

## 2011-09-21 DIAGNOSIS — I259 Chronic ischemic heart disease, unspecified: Secondary | ICD-10-CM

## 2011-09-21 LAB — ICD DEVICE OBSERVATION
AL AMPLITUDE: 3.1 mv
AL IMPEDENCE ICD: 632 Ohm
AL THRESHOLD: 0.5 V
DEV-0020ICD: NEGATIVE
MODE SWITCH EPISODES: 4
TZAT-0001ATACH: 1
TZAT-0001ATACH: 3
TZAT-0001SLOWVT: 1
TZAT-0002ATACH: NEGATIVE
TZAT-0002FASTVT: NEGATIVE
TZAT-0004SLOWVT: 8
TZAT-0005SLOWVT: 88 pct
TZAT-0012ATACH: 150 ms
TZAT-0012ATACH: 150 ms
TZAT-0018ATACH: NEGATIVE
TZAT-0018FASTVT: NEGATIVE
TZAT-0019ATACH: 6 V
TZAT-0019FASTVT: 8 V
TZAT-0020ATACH: 1.5 ms
TZAT-0020ATACH: 1.5 ms
TZON-0003ATACH: 350 ms
TZON-0003VSLOWVT: 450 ms
TZST-0001ATACH: 4
TZST-0001ATACH: 5
TZST-0001ATACH: 6
TZST-0001FASTVT: 5
TZST-0001FASTVT: 6
TZST-0001SLOWVT: 2
TZST-0001SLOWVT: 5
TZST-0002ATACH: NEGATIVE
TZST-0002ATACH: NEGATIVE
TZST-0002FASTVT: NEGATIVE
TZST-0002FASTVT: NEGATIVE
TZST-0003SLOWVT: 35 J
TZST-0003SLOWVT: 35 J
VENTRICULAR PACING ICD: 100 pct

## 2011-09-21 NOTE — Assessment & Plan Note (Signed)
Today she is in sinus rhythm but interrogation of her ICD demonstrates that she has been in atrial fibrillation intermittently for over an hour at a time induration. Today we discussed the treatment options. I have recommended discontinuing Plavix and starting systemic anticoagulation. The patient would like me to discuss this with Dr. Riley Kill before a final decision is made and I will do so in the next several days. Ultimately, a drug like Pradaxa might be a better option.

## 2011-09-21 NOTE — Assessment & Plan Note (Signed)
Her device is working normally. Interrogation however demonstrates episodes of atrial fibrillation some of which have lasted more than an hour. No ICD shocks. We'll recheck device in several months.

## 2011-09-21 NOTE — Progress Notes (Signed)
HPI [name] returns today for followup. She is a very pleasant 78 year old woman with an ischemic cardiomyopathy, chronic systolic heart failure, class II, status post ICD implantation. The patient notes rare palpitations. No chest pain and no shortness of breath. She has been on Plavix and has had some bruising of her hands. Minimal peripheral edema. She denies syncope. No ICD shocks. Allergies  Allergen Reactions  . Codeine     REACTION: Nausea     Current Outpatient Prescriptions  Medication Sig Dispense Refill  . amLODipine-atorvastatin (CADUET) 5-40 MG per tablet Take 1 tablet by mouth daily.        [name] aspirin 81 MG tablet Take 81 mg by mouth daily.        . calcium carbonate 200 MG capsule Take 400 mg by mouth 2 (two) times daily with a meal.       . Cholecalciferol (VITAMIN D) 2000 UNITS CAPS Take 1 capsule by mouth daily.        . folic acid (FOLVITE) 1 MG tablet Take 400 mcg by mouth 2 (two) times daily.       . hydrochlorothiazide (,MICROZIDE/HYDRODIURIL,) 12.5 MG capsule Take 1 capsule (12.5 mg total) by mouth daily.  30 capsule  11  . metoprolol (TOPROL-XL) 200 MG 24 hr tablet Take 200 mg by mouth daily.        [name] PLAVIX 75 MG tablet TAKE ONE TABLET BY MOUTH ONCE DAILY.  30 each  6  . potassium chloride SA (K-DUR,KLOR-CON) 20 MEQ tablet Take 1 tablet (20 mEq total) by mouth daily.  30 tablet  2  . telmisartan-hydrochlorothiazide (MICARDIS HCT) 80-12.5 MG per tablet Take 1 tablet by mouth daily.           Past Medical History  Diagnosis Date  . Osteoarthritis (arthritis due to wear and tear of joints)   . Pulmonary hypertension   . Hyperlipidemia   . CAD (coronary artery disease)   . Hypertension   . CHF (congestive heart failure)   . Ischemic heart disease   . Heart block   . Syncope   . Ventricular tachycardia     ROS:   All systems reviewed and negative except as noted in the HPI.   Past Surgical History  Procedure Date  . Coronary angioplasty with stent  placement   . Coronary artery bypass graft     times 4  . Cardiac defibrillator placement      Family History  Problem Relation Age of Onset  . Heart attack Father   . Heart attack Brother      History   Social History  . Marital Status: Widowed    Spouse Name: N/A    Number of Children: 2  . Years of Education: N/A   Occupational History  . retired     Designer, jewellery   Social History Main Topics  . Smoking status: Never Smoker   . Smokeless tobacco: Never Used  . Alcohol Use: No  . Drug Use: No  . Sexually Active: Not on file   Other Topics Concern  . Not on file   Social History Narrative  . No narrative on file     BP 150/77  Pulse 73  Resp 18  Ht 5\' 1"  (1.549 m)  Wt 172 lb (78.019 kg)  BMI 32.50 kg/m2  Physical Exam:  Well appearing 78 year old woman, NAD HEENT: Unremarkable Neck:  No JVD, no thyromegally Lungs:  Clear with no wheezes, rales, or rhonchi. HEART:  Regular rate rhythm, 2/6 systolic murmur, no rubs, no clicks Abd:  soft, positive bowel sounds, no organomegally, no rebound, no guarding Ext:  2 plus pulses, no edema, no cyanosis, no clubbing Skin:  No rashes no nodules Neuro:  CN II through XII intact, motor grossly intact  DEVICE  Normal device function.  See PaceArt for details.   Assess/Plan:

## 2011-09-21 NOTE — Assessment & Plan Note (Signed)
She denies anginal symptoms. She has been on Plavix. We will discuss the possibility of stopping this medication if we decided to add another anticoagulant.

## 2011-09-21 NOTE — Patient Instructions (Signed)
**Note De-identified Carol Theys Obfuscation** Your physician recommends that you continue on your current medications as directed. Please refer to the Current Medication list given to you today.  Your physician recommends that you schedule a follow-up appointment in: telephone device check on 9-5 and with Dr. Taylor in 1 year   

## 2011-09-29 ENCOUNTER — Telehealth: Payer: Self-pay | Admitting: *Deleted

## 2011-09-29 NOTE — Telephone Encounter (Signed)
Left message for [name] [name] to call, dr taylor was questioning the [name] [name] anticoagulation because of new atrial fib. Per dr Riley Kill, he would like to see the [name] [name] in the office to discuss. [name] [name] to call to get scheduled.

## 2011-09-29 NOTE — Telephone Encounter (Signed)
Spoke with pt, pt will see dr Riley Kill tomorrow

## 2011-09-29 NOTE — Telephone Encounter (Signed)
Fu call °Patient returning your call °

## 2011-09-30 ENCOUNTER — Encounter: Payer: Self-pay | Admitting: Cardiology

## 2011-09-30 ENCOUNTER — Ambulatory Visit (INDEPENDENT_AMBULATORY_CARE_PROVIDER_SITE_OTHER): Payer: Medicare Other | Admitting: Cardiology

## 2011-09-30 VITALS — BP 169/80 | HR 65 | Ht 61.0 in | Wt 172.4 lb

## 2011-09-30 DIAGNOSIS — I4891 Unspecified atrial fibrillation: Secondary | ICD-10-CM

## 2011-09-30 DIAGNOSIS — I2510 Atherosclerotic heart disease of native coronary artery without angina pectoris: Secondary | ICD-10-CM

## 2011-09-30 DIAGNOSIS — I5022 Chronic systolic (congestive) heart failure: Secondary | ICD-10-CM

## 2011-09-30 DIAGNOSIS — I10 Essential (primary) hypertension: Secondary | ICD-10-CM

## 2011-09-30 LAB — CBC WITH DIFFERENTIAL/PLATELET
Basophils Absolute: 0 10*3/uL (ref 0.0–0.1)
Hemoglobin: 13.8 g/dL (ref 12.0–15.0)
Lymphocytes Relative: 28.2 % (ref 12.0–46.0)
Monocytes Relative: 10 % (ref 3.0–12.0)
Neutro Abs: 4.9 10*3/uL (ref 1.4–7.7)
RBC: 4.45 Mil/uL (ref 3.87–5.11)
RDW: 13.9 % (ref 11.5–14.6)
WBC: 8.4 10*3/uL (ref 4.5–10.5)

## 2011-09-30 MED ORDER — WARFARIN SODIUM 2.5 MG PO TABS: 2.5000 mg | ORAL_TABLET | Freq: Every day | ORAL | Status: DC

## 2011-09-30 MED ORDER — HYDROCHLOROTHIAZIDE 12.5 MG PO CAPS: 12.5000 mg | ORAL_CAPSULE | Freq: Every day | ORAL | Status: DC

## 2011-09-30 NOTE — Progress Notes (Signed)
HPI:  [name] [name] is seen in follow up.  Clinically she is largely unchanged, but she saw Dr. Ladona Ridgel and was noted to have episodes of atrial fib on her pacemaker check up.  Sharlot Gowda recommended discontinuing plavix and initiation of warfarin.  She wanted to discuss this in detail with me before deciding.  It is not clear that she ever notices being in and out of rhythm, but certainly I am not surprised by this.  We reviewed her stent data as well.   Current Outpatient Prescriptions  Medication Sig Dispense Refill  . amLODipine-atorvastatin (CADUET) 5-40 MG per tablet Take 1 tablet by mouth daily.        [name] aspirin 81 MG tablet Take 81 mg by mouth daily.        . calcium carbonate 200 MG capsule Take 400 mg by mouth 2 (two) times daily with a meal.       . Cholecalciferol (VITAMIN D) 2000 UNITS CAPS Take 1 capsule by mouth daily.        . folic acid (FOLVITE) 1 MG tablet Take 400 mcg by mouth 2 (two) times daily.       . hydrochlorothiazide (MICROZIDE) 12.5 MG capsule Take 1 capsule (12.5 mg total) by mouth daily.  30 capsule  11  . metoprolol (TOPROL-XL) 200 MG 24 hr tablet Take 200 mg by mouth daily.        . potassium chloride SA (K-DUR,KLOR-CON) 20 MEQ tablet Take 1 tablet (20 mEq total) by mouth daily.  30 tablet  2  . telmisartan-hydrochlorothiazide (MICARDIS HCT) 80-12.5 MG per tablet Take 1 tablet by mouth daily.        [name] warfarin (COUMADIN) 2.5 MG tablet Take 1 tablet (2.5 mg total) by mouth daily.  90 tablet  3  . DISCONTD: hydrochlorothiazide (,MICROZIDE/HYDRODIURIL,) 12.5 MG capsule Take 1 capsule (12.5 mg total) by mouth daily.  30 capsule  11    Allergies  Allergen Reactions  . Codeine     REACTION: Nausea    Past Medical History  Diagnosis Date  . Osteoarthritis (arthritis due to wear and tear of joints)   . Pulmonary hypertension   . Hyperlipidemia   . CAD (coronary artery disease)   . Hypertension   . CHF (congestive heart failure)   . Ischemic heart disease   . Heart block    . Syncope   . Ventricular tachycardia     Past Surgical History  Procedure Date  . Coronary angioplasty with stent placement   . Coronary artery bypass graft     times 4  . Cardiac defibrillator placement     Family History  Problem Relation Age of Onset  . Heart attack Father   . Heart attack Brother     History   Social History  . Marital Status: Widowed    Spouse Name: N/A    Number of Children: 2  . Years of Education: N/A   Occupational History  . retired     Designer, jewellery   Social History Main Topics  . Smoking status: Never Smoker   . Smokeless tobacco: Never Used  . Alcohol Use: No  . Drug Use: No  . Sexually Active: Not on file   Other Topics Concern  . Not on file   Social History Narrative  . No narrative on file    ROS: Please see the HPI.  All other systems reviewed and negative.  PHYSICAL EXAM:  BP 169/80  Pulse 65  Ht  5\' 1"  (1.549 m)  Wt 172 lb 6.4 oz (78.2 kg)  BMI 32.57 kg/m2  General: Well developed, well nourished, in no acute distress. Head:  Normocephalic and atraumatic. Neck: no JVD Lungs: Clear to auscultation and percussion. Heart: Normal S1 and S2.  Soft SEM.  No change in exam.   Pulses: Pulses normal in all 4 extremities. Extremities: No clubbing or cyanosis. No edema. Neurologic: Alert and oriented x 3.  EKG:  ASSESSMENT AND PLAN:

## 2011-09-30 NOTE — Patient Instructions (Addendum)
Your physician recommends that you schedule a follow-up appointment in: 2 WEEKS  TAKE THE LAST DOSE OF PLAVIX ON Saturday  ON Sunday START WARFARIN 2.5 MG ONCE DAILY  ESTABLISH WITH [facility] IN [place] OFFICE Thursday OR Friday NEXT WEEK  Your physician recommends that you HAVE LAB WORK TODAY

## 2011-10-01 ENCOUNTER — Other Ambulatory Visit: Payer: Self-pay | Admitting: *Deleted

## 2011-10-01 ENCOUNTER — Telehealth: Payer: Self-pay | Admitting: Cardiology

## 2011-10-01 NOTE — Telephone Encounter (Signed)
New problem:  Patient was seen on 06/12.  advise that she was to start a new medication patient states she was not given a new Rx . Would like to discuss.

## 2011-10-01 NOTE — Telephone Encounter (Signed)
Pt advised the Rx for coumadin was electronically sent to her pharmacy.

## 2011-10-01 NOTE — Telephone Encounter (Signed)
Attempted to return patient's call numerous times, line continues to be busy.

## 2011-10-07 ENCOUNTER — Telehealth: Payer: Self-pay | Admitting: Cardiology

## 2011-10-07 NOTE — Telephone Encounter (Signed)
I spoke with the pt and she said that Dr Riley Kill had initially instructed her to start Coumadin on Sunday but then changed his mind and had her start it this past Monday.  The pt has taken 3 doses and wanted to know if it would be better to get her INR checked on Friday.  The pt is currently scheduled for an INR tomorrow in the [place] office.  I made the pt aware that she can contact the [place] office tomorrow and reschedule her appointment to Friday if there is availability.  The pt agreed with plan.  I did tell the pt that if they cannot do INR on Friday then the pt should go ahead and get this level checked on Thursday. Pt agreed with plan.

## 2011-10-07 NOTE — Telephone Encounter (Signed)
New problem:  Patient calling just starting taken coumadin. Have some question.

## 2011-10-08 ENCOUNTER — Ambulatory Visit (INDEPENDENT_AMBULATORY_CARE_PROVIDER_SITE_OTHER): Payer: Medicare Other | Admitting: *Deleted

## 2011-10-08 DIAGNOSIS — I509 Heart failure, unspecified: Secondary | ICD-10-CM

## 2011-10-08 DIAGNOSIS — I2789 Other specified pulmonary heart diseases: Secondary | ICD-10-CM

## 2011-10-08 DIAGNOSIS — Z7901 Long term (current) use of anticoagulants: Secondary | ICD-10-CM

## 2011-10-08 DIAGNOSIS — I4891 Unspecified atrial fibrillation: Secondary | ICD-10-CM

## 2011-10-12 ENCOUNTER — Ambulatory Visit (INDEPENDENT_AMBULATORY_CARE_PROVIDER_SITE_OTHER): Payer: Medicare Other | Admitting: *Deleted

## 2011-10-12 DIAGNOSIS — I509 Heart failure, unspecified: Secondary | ICD-10-CM

## 2011-10-12 DIAGNOSIS — Z7901 Long term (current) use of anticoagulants: Secondary | ICD-10-CM

## 2011-10-12 DIAGNOSIS — I4891 Unspecified atrial fibrillation: Secondary | ICD-10-CM

## 2011-10-12 DIAGNOSIS — I2789 Other specified pulmonary heart diseases: Secondary | ICD-10-CM

## 2011-10-14 NOTE — Assessment & Plan Note (Signed)
[name] [name] has new onset of atrial fib.  I agree with Dr. Ladona Ridgel and think that she would benefit from treatment with antithrombin therapy.  Specifically, would switch to warfarin.  Could remain on plavix, but would likely just continue a baby ASA as an alternative.  Reviewed with patient and we could arrange for follow up in [name].  She is agreeable to this.

## 2011-10-14 NOTE — Assessment & Plan Note (Signed)
Still has modest hypertension.  Continue meds.

## 2011-10-14 NOTE — Assessment & Plan Note (Signed)
Remote DES.  Would continue antiplatelet therapy.  She has used clopidogrel by choice over the past few years.  Will stop and use ASA.

## 2011-10-19 ENCOUNTER — Ambulatory Visit (INDEPENDENT_AMBULATORY_CARE_PROVIDER_SITE_OTHER): Payer: Medicare Other | Admitting: *Deleted

## 2011-10-19 DIAGNOSIS — I509 Heart failure, unspecified: Secondary | ICD-10-CM

## 2011-10-19 DIAGNOSIS — Z7901 Long term (current) use of anticoagulants: Secondary | ICD-10-CM

## 2011-10-19 DIAGNOSIS — I4891 Unspecified atrial fibrillation: Secondary | ICD-10-CM

## 2011-10-19 DIAGNOSIS — I2789 Other specified pulmonary heart diseases: Secondary | ICD-10-CM

## 2011-10-19 LAB — POCT INR: INR: 3

## 2011-10-20 ENCOUNTER — Encounter: Payer: Self-pay | Admitting: Cardiology

## 2011-10-20 ENCOUNTER — Ambulatory Visit (INDEPENDENT_AMBULATORY_CARE_PROVIDER_SITE_OTHER): Payer: Medicare Other | Admitting: Cardiology

## 2011-10-20 VITALS — BP 152/74 | HR 64 | Ht 61.0 in | Wt 175.0 lb

## 2011-10-20 DIAGNOSIS — I2510 Atherosclerotic heart disease of native coronary artery without angina pectoris: Secondary | ICD-10-CM

## 2011-10-20 DIAGNOSIS — I4891 Unspecified atrial fibrillation: Secondary | ICD-10-CM

## 2011-10-20 LAB — BASIC METABOLIC PANEL
CO2: 28 mEq/L (ref 19–32)
Calcium: 9.7 mg/dL (ref 8.4–10.5)
GFR: 53.33 mL/min — ABNORMAL LOW (ref >60.00)
Sodium: 142 mEq/L (ref 135–145)

## 2011-10-20 NOTE — Assessment & Plan Note (Signed)
None of symptoms at the present time.

## 2011-10-20 NOTE — Assessment & Plan Note (Signed)
Portion is known onset. Check her thyroid function obtained a basic metabolic profile. He'll see the patient back in followup in one month. She'll be continued in the Coumadin clinic in [place], with a goal INR 2-3.

## 2011-10-20 NOTE — Assessment & Plan Note (Signed)
Her continue her for followup.

## 2011-10-20 NOTE — Progress Notes (Signed)
HPI:  The patient is in for followup. He recently identified that she had mode switches. As a result she has been placed on warfarin for underlying atrial fibrillation. She is stable. She is tolerating this well. She is followed up with Misty Stanley in the Coumadin clinic in [place]. She's been carefully monitored during this period of drug initiation. She's not having any major problems there's been no unusual bleeding.  Current Outpatient Prescriptions  Medication Sig Dispense Refill  . amLODipine-atorvastatin (CADUET) 5-40 MG per tablet Take 1 tablet by mouth daily.        [name] aspirin 81 MG tablet Take 81 mg by mouth daily.        . calcium carbonate 200 MG capsule Take 400 mg by mouth 2 (two) times daily with a meal.       . Cholecalciferol (VITAMIN D) 2000 UNITS CAPS Take 1 capsule by mouth daily.        . clopidogrel (PLAVIX) 75 MG tablet       . folic acid (FOLVITE) 1 MG tablet Take 400 mcg by mouth 2 (two) times daily.       . hydrochlorothiazide (MICROZIDE) 12.5 MG capsule Take 1 capsule (12.5 mg total) by mouth daily.  30 capsule  11  . metoprolol (TOPROL-XL) 200 MG 24 hr tablet Take 200 mg by mouth daily.        . potassium chloride SA (K-DUR,KLOR-CON) 20 MEQ tablet Take 1 tablet (20 mEq total) by mouth daily.  30 tablet  2  . telmisartan-hydrochlorothiazide (MICARDIS HCT) 80-12.5 MG per tablet Take 1 tablet by mouth daily.        [name] warfarin (COUMADIN) 2.5 MG tablet Take 1 tablet (2.5 mg total) by mouth daily.  90 tablet  3    Allergies  Allergen Reactions  . Codeine     REACTION: Nausea    Past Medical History  Diagnosis Date  . Osteoarthritis (arthritis due to wear and tear of joints)   . Pulmonary hypertension   . Hyperlipidemia   . CAD (coronary artery disease)   . Hypertension   . CHF (congestive heart failure)   . Ischemic heart disease   . Heart block   . Syncope   . Ventricular tachycardia     Past Surgical History  Procedure Date  . Coronary angioplasty with  stent placement   . Coronary artery bypass graft     times 4  . Cardiac defibrillator placement     Family History  Problem Relation Age of Onset  . Heart attack Father   . Heart attack Brother     History   Social History  . Marital Status: Widowed    Spouse Name: N/A    Number of Children: 2  . Years of Education: N/A   Occupational History  . retired     Designer, jewellery   Social History Main Topics  . Smoking status: Never Smoker   . Smokeless tobacco: Never Used  . Alcohol Use: No  . Drug Use: No  . Sexually Active: Not on file   Other Topics Concern  . Not on file   Social History Narrative  . No narrative on file    ROS: Please see the HPI.  All other systems reviewed and negative.  PHYSICAL EXAM:  BP 152/74  Pulse 64  Ht 5\' 1"  (1.549 m)  Wt 175 lb (79.379 kg)  BMI 33.07 kg/m2  General: Well developed, well nourished, in no acute distress. Head:  Normocephalic and atraumatic. Neck: no JVD Lungs: Clear to auscultation and percussion. Heart: Normal S1 and S2.  No murmur, rubs or gallops.  Abdomen:  Normal bowel sounds; soft; non tender; no organomegaly Pulses: Pulses normal in all 4 extremities. Extremities: No clubbing or cyanosis. No edema. Neurologic: Alert and oriented x 3.  EKG:  Av pacing ASSESSMENT AND PLAN:

## 2011-10-20 NOTE — Patient Instructions (Signed)
Your physician recommends that you schedule a follow-up appointment in: 1 MONTH with Dr Riley Kill  Your physician recommends that you continue on your current medications as directed. Please refer to the Current Medication list given to you today.  Your physician recommends that you have lab work today: BMP, TSH and Free T4

## 2011-10-21 ENCOUNTER — Other Ambulatory Visit: Payer: Self-pay | Admitting: *Deleted

## 2011-10-21 MED ORDER — POTASSIUM CHLORIDE CRYS ER 20 MEQ PO TBCR: 20.0000 meq | EXTENDED_RELEASE_TABLET | Freq: Every day | ORAL | Status: DC

## 2011-10-26 ENCOUNTER — Ambulatory Visit (INDEPENDENT_AMBULATORY_CARE_PROVIDER_SITE_OTHER): Payer: Medicare Other | Admitting: *Deleted

## 2011-10-26 DIAGNOSIS — Z7901 Long term (current) use of anticoagulants: Secondary | ICD-10-CM

## 2011-10-26 DIAGNOSIS — I4891 Unspecified atrial fibrillation: Secondary | ICD-10-CM

## 2011-10-26 DIAGNOSIS — I2789 Other specified pulmonary heart diseases: Secondary | ICD-10-CM

## 2011-10-26 DIAGNOSIS — I509 Heart failure, unspecified: Secondary | ICD-10-CM

## 2011-11-09 ENCOUNTER — Ambulatory Visit (INDEPENDENT_AMBULATORY_CARE_PROVIDER_SITE_OTHER): Payer: Medicare Other | Admitting: *Deleted

## 2011-11-09 DIAGNOSIS — I509 Heart failure, unspecified: Secondary | ICD-10-CM

## 2011-11-09 DIAGNOSIS — Z7901 Long term (current) use of anticoagulants: Secondary | ICD-10-CM

## 2011-11-09 DIAGNOSIS — I2789 Other specified pulmonary heart diseases: Secondary | ICD-10-CM

## 2011-11-09 DIAGNOSIS — I4891 Unspecified atrial fibrillation: Secondary | ICD-10-CM

## 2011-11-09 LAB — POCT INR: INR: 1.5

## 2011-11-16 ENCOUNTER — Encounter: Payer: Self-pay | Admitting: Cardiology

## 2011-11-16 ENCOUNTER — Ambulatory Visit (INDEPENDENT_AMBULATORY_CARE_PROVIDER_SITE_OTHER): Payer: Medicare Other | Admitting: Cardiology

## 2011-11-16 VITALS — BP 138/71 | HR 68 | Ht 62.0 in | Wt 171.2 lb

## 2011-11-16 DIAGNOSIS — I2510 Atherosclerotic heart disease of native coronary artery without angina pectoris: Secondary | ICD-10-CM

## 2011-11-16 DIAGNOSIS — I4891 Unspecified atrial fibrillation: Secondary | ICD-10-CM

## 2011-11-16 DIAGNOSIS — E785 Hyperlipidemia, unspecified: Secondary | ICD-10-CM

## 2011-11-16 DIAGNOSIS — R0602 Shortness of breath: Secondary | ICD-10-CM

## 2011-11-16 DIAGNOSIS — I472 Ventricular tachycardia: Secondary | ICD-10-CM

## 2011-11-16 NOTE — Patient Instructions (Addendum)
Your physician recommends that you schedule a follow-up appointment in: 3 MONTHS with Dr Stuckey  Your physician recommends that you continue on your current medications as directed. Please refer to the Current Medication list given to you today.  

## 2011-11-16 NOTE — Assessment & Plan Note (Signed)
No change in status.

## 2011-11-16 NOTE — Progress Notes (Signed)
HPI:  The patient is doing very well. She's had no change in her overall baseline symptoms. We've initiated Coumadin therapy based on mode switching parameters, and she is tolerated this well. She's going to the clinic up in [place] and this is work out without difficulty. She denies any ongoing chest pain.  Current Outpatient Prescriptions  Medication Sig Dispense Refill  . amLODipine-atorvastatin (CADUET) 5-40 MG per tablet Take 1 tablet by mouth daily.        [name] aspirin 81 MG tablet Take 81 mg by mouth daily.        . calcium carbonate 200 MG capsule Take 400 mg by mouth 2 (two) times daily with a meal.       . Cholecalciferol (VITAMIN D) 2000 UNITS CAPS Take 1 capsule by mouth daily.        . folic acid (FOLVITE) 1 MG tablet Take 400 mcg by mouth 2 (two) times daily.       . hydrochlorothiazide (MICROZIDE) 12.5 MG capsule Take 1 capsule (12.5 mg total) by mouth daily.  30 capsule  11  . metoprolol (TOPROL-XL) 200 MG 24 hr tablet Take 200 mg by mouth daily.        . potassium chloride SA (K-DUR,KLOR-CON) 20 MEQ tablet Take 1 tablet (20 mEq total) by mouth daily.  30 tablet  3  . telmisartan-hydrochlorothiazide (MICARDIS HCT) 80-12.5 MG per tablet Take 1 tablet by mouth daily.        [name] warfarin (COUMADIN) 2.5 MG tablet Take 1 tablet (2.5 mg total) by mouth daily.  90 tablet  3    Allergies  Allergen Reactions  . Codeine     REACTION: Nausea    Past Medical History  Diagnosis Date  . Osteoarthritis (arthritis due to wear and tear of joints)   . Pulmonary hypertension   . Hyperlipidemia   . CAD (coronary artery disease)   . Hypertension   . CHF (congestive heart failure)   . Ischemic heart disease   . Heart block   . Syncope   . Ventricular tachycardia     Past Surgical History  Procedure Date  . Coronary angioplasty with stent placement   . Coronary artery bypass graft     times 4  . Cardiac defibrillator placement     Family History  Problem Relation Age of Onset    . Heart attack Father   . Heart attack Brother     History   Social History  . Marital Status: Widowed    Spouse Name: N/A    Number of Children: 2  . Years of Education: N/A   Occupational History  . retired     Designer, jewellery   Social History Main Topics  . Smoking status: Never Smoker   . Smokeless tobacco: Never Used  . Alcohol Use: No  . Drug Use: No  . Sexually Active: Not on file   Other Topics Concern  . Not on file   Social History Narrative  . No narrative on file    ROS: Please see the HPI.  All other systems reviewed and negative.  PHYSICAL EXAM:  BP 138/71  Pulse 68  Ht 5\' 2"  (1.575 m)  Wt 171 lb 3.2 oz (77.656 kg)  BMI 31.31 kg/m2  BP 154/80  General: Well developed, well nourished, in no acute distress. Head:  Normocephalic and atraumatic. Neck: no JVD Lungs: Clear to auscultation and percussion. Heart: Normal S1 and increased P2.  No murmur, rubs or  gallops.  Pulses: Pulses normal in all 4 extremities. Extremities: No clubbing or cyanosis. No edema. Neurologic: Alert and oriented x 3.  EKG:  Atrial tracing, ventricular pacing.    ASSESSMENT AND PLAN:

## 2011-11-16 NOTE — Assessment & Plan Note (Signed)
Followed by Dr. Hawkins. 

## 2011-11-16 NOTE — Assessment & Plan Note (Signed)
Continues to do well.  No recurrent symptoms

## 2011-11-16 NOTE — Assessment & Plan Note (Signed)
Stable on meds.  We discussed merit of asa/warfarin combo vs plavix/warfarin.  She is remaining on warfarin.  No bleeding.  Continue follow up in [place].

## 2011-11-18 ENCOUNTER — Ambulatory Visit (INDEPENDENT_AMBULATORY_CARE_PROVIDER_SITE_OTHER): Payer: Medicare Other | Admitting: *Deleted

## 2011-11-18 DIAGNOSIS — I509 Heart failure, unspecified: Secondary | ICD-10-CM

## 2011-11-18 DIAGNOSIS — Z7901 Long term (current) use of anticoagulants: Secondary | ICD-10-CM

## 2011-11-18 DIAGNOSIS — I2789 Other specified pulmonary heart diseases: Secondary | ICD-10-CM

## 2011-11-18 DIAGNOSIS — I4891 Unspecified atrial fibrillation: Secondary | ICD-10-CM

## 2011-11-18 LAB — POCT INR: INR: 2.7

## 2011-11-22 NOTE — Assessment & Plan Note (Signed)
Followed by Dr. Taylor 

## 2011-12-02 ENCOUNTER — Ambulatory Visit (INDEPENDENT_AMBULATORY_CARE_PROVIDER_SITE_OTHER): Payer: Medicare Other | Admitting: *Deleted

## 2011-12-02 DIAGNOSIS — I509 Heart failure, unspecified: Secondary | ICD-10-CM

## 2011-12-02 DIAGNOSIS — I2789 Other specified pulmonary heart diseases: Secondary | ICD-10-CM

## 2011-12-02 DIAGNOSIS — I4891 Unspecified atrial fibrillation: Secondary | ICD-10-CM

## 2011-12-02 DIAGNOSIS — Z7901 Long term (current) use of anticoagulants: Secondary | ICD-10-CM

## 2011-12-02 LAB — POCT INR: INR: 3

## 2011-12-17 ENCOUNTER — Ambulatory Visit (INDEPENDENT_AMBULATORY_CARE_PROVIDER_SITE_OTHER): Payer: Medicare Other | Admitting: *Deleted

## 2011-12-17 DIAGNOSIS — I4891 Unspecified atrial fibrillation: Secondary | ICD-10-CM

## 2011-12-17 DIAGNOSIS — I2789 Other specified pulmonary heart diseases: Secondary | ICD-10-CM

## 2011-12-17 DIAGNOSIS — Z7901 Long term (current) use of anticoagulants: Secondary | ICD-10-CM

## 2011-12-17 DIAGNOSIS — I509 Heart failure, unspecified: Secondary | ICD-10-CM

## 2011-12-24 ENCOUNTER — Encounter: Payer: Medicare Other | Admitting: *Deleted

## 2011-12-30 ENCOUNTER — Encounter: Payer: Self-pay | Admitting: *Deleted

## 2011-12-31 ENCOUNTER — Ambulatory Visit (INDEPENDENT_AMBULATORY_CARE_PROVIDER_SITE_OTHER): Payer: Medicare Other | Admitting: *Deleted

## 2011-12-31 DIAGNOSIS — I2789 Other specified pulmonary heart diseases: Secondary | ICD-10-CM

## 2011-12-31 DIAGNOSIS — Z7901 Long term (current) use of anticoagulants: Secondary | ICD-10-CM

## 2011-12-31 DIAGNOSIS — I4891 Unspecified atrial fibrillation: Secondary | ICD-10-CM

## 2011-12-31 DIAGNOSIS — I509 Heart failure, unspecified: Secondary | ICD-10-CM

## 2011-12-31 LAB — POCT INR: INR: 2.3

## 2012-01-01 ENCOUNTER — Ambulatory Visit (HOSPITAL_COMMUNITY)
Admission: RE | Admit: 2012-01-01 | Discharge: 2012-01-01 | Disposition: A | Discharge status: Home / Self Care | Payer: Medicare Other | Source: Ambulatory Visit | Attending: Pulmonary Disease | Admitting: Pulmonary Disease

## 2012-01-01 ENCOUNTER — Other Ambulatory Visit (HOSPITAL_COMMUNITY): Payer: Self-pay | Admitting: Pulmonary Disease

## 2012-01-01 DIAGNOSIS — S9000XA Contusion of unspecified ankle, initial encounter: Secondary | ICD-10-CM | POA: Insufficient documentation

## 2012-01-01 DIAGNOSIS — R52 Pain, unspecified: Secondary | ICD-10-CM

## 2012-01-01 DIAGNOSIS — W19XXXA Unspecified fall, initial encounter: Secondary | ICD-10-CM | POA: Insufficient documentation

## 2012-01-01 DIAGNOSIS — M25579 Pain in unspecified ankle and joints of unspecified foot: Secondary | ICD-10-CM | POA: Insufficient documentation

## 2012-01-04 ENCOUNTER — Telehealth: Payer: Self-pay | Admitting: Internal Medicine

## 2012-01-04 NOTE — Telephone Encounter (Signed)
Patient needs to speak with you regarding remote check on her device. / tg

## 2012-01-04 NOTE — Telephone Encounter (Signed)
Patient forgot to download, and will send later today.

## 2012-01-06 ENCOUNTER — Ambulatory Visit (INDEPENDENT_AMBULATORY_CARE_PROVIDER_SITE_OTHER): Payer: Medicare Other | Admitting: *Deleted

## 2012-01-06 ENCOUNTER — Encounter: Payer: Self-pay | Admitting: Internal Medicine

## 2012-01-06 DIAGNOSIS — I259 Chronic ischemic heart disease, unspecified: Secondary | ICD-10-CM

## 2012-01-06 DIAGNOSIS — Z95810 Presence of automatic (implantable) cardiac defibrillator: Secondary | ICD-10-CM

## 2012-01-07 LAB — REMOTE ICD DEVICE
ATRIAL PACING ICD: 92.65 pct
BRDY-0004RA: 130 {beats}/min
DEV-0020ICD: NEGATIVE
FVT: 0
PACEART VT: 0
TOT-0001: 1
TZAT-0001ATACH: 1
TZAT-0001ATACH: 2
TZAT-0001ATACH: 3
TZAT-0001SLOWVT: 1
TZAT-0002ATACH: NEGATIVE
TZAT-0002ATACH: NEGATIVE
TZAT-0011SLOWVT: 10 ms
TZAT-0012ATACH: 150 ms
TZAT-0012ATACH: 150 ms
TZAT-0012FASTVT: 200 ms
TZAT-0018ATACH: NEGATIVE
TZAT-0018ATACH: NEGATIVE
TZAT-0018ATACH: NEGATIVE
TZAT-0019ATACH: 6 V
TZAT-0019ATACH: 6 V
TZAT-0019FASTVT: 8 V
TZAT-0019SLOWVT: 8 V
TZAT-0020FASTVT: 1.5 ms
TZON-0003ATACH: 350 ms
TZST-0001ATACH: 4
TZST-0001ATACH: 5
TZST-0001ATACH: 6
TZST-0001FASTVT: 5
TZST-0001SLOWVT: 2
TZST-0001SLOWVT: 3
TZST-0001SLOWVT: 4
TZST-0001SLOWVT: 6
TZST-0002ATACH: NEGATIVE
TZST-0002ATACH: NEGATIVE
TZST-0002FASTVT: NEGATIVE
TZST-0002FASTVT: NEGATIVE
TZST-0002FASTVT: NEGATIVE
TZST-0002FASTVT: NEGATIVE
TZST-0003SLOWVT: 20 J
TZST-0003SLOWVT: 35 J
TZST-0003SLOWVT: 35 J
VENTRICULAR PACING ICD: 99.99 pct

## 2012-01-13 ENCOUNTER — Encounter: Payer: Self-pay | Admitting: *Deleted

## 2012-01-21 ENCOUNTER — Ambulatory Visit (INDEPENDENT_AMBULATORY_CARE_PROVIDER_SITE_OTHER): Payer: Medicare Other | Admitting: *Deleted

## 2012-01-21 DIAGNOSIS — Z7901 Long term (current) use of anticoagulants: Secondary | ICD-10-CM

## 2012-01-21 DIAGNOSIS — I2789 Other specified pulmonary heart diseases: Secondary | ICD-10-CM

## 2012-01-21 DIAGNOSIS — I4891 Unspecified atrial fibrillation: Secondary | ICD-10-CM

## 2012-01-21 DIAGNOSIS — I509 Heart failure, unspecified: Secondary | ICD-10-CM

## 2012-01-22 ENCOUNTER — Telehealth: Payer: Self-pay | Admitting: *Deleted

## 2012-01-22 NOTE — Telephone Encounter (Signed)
Dr Juanetta Gosling wants to put pt on Doxycycline 500mg  bid for leg cellulitis.  Told to have pt decrease coumadin to 2.5mg  daily and come for INR check on 10/9.  Pt told per Kriste Basque at his office.

## 2012-01-27 ENCOUNTER — Ambulatory Visit (INDEPENDENT_AMBULATORY_CARE_PROVIDER_SITE_OTHER): Payer: Medicare Other | Admitting: *Deleted

## 2012-01-27 DIAGNOSIS — I4891 Unspecified atrial fibrillation: Secondary | ICD-10-CM

## 2012-01-27 DIAGNOSIS — I509 Heart failure, unspecified: Secondary | ICD-10-CM

## 2012-01-27 DIAGNOSIS — Z7901 Long term (current) use of anticoagulants: Secondary | ICD-10-CM

## 2012-01-27 DIAGNOSIS — I2789 Other specified pulmonary heart diseases: Secondary | ICD-10-CM

## 2012-02-05 ENCOUNTER — Telehealth: Payer: Self-pay | Admitting: *Deleted

## 2012-02-05 NOTE — Telephone Encounter (Signed)
Pt saw Dr Juanetta Gosling today and he put her on Bactrim DS for leg wound.  Told pt to start Bactrim tonight.  She will take coumadin 5mg  tomorrow then starting Sunday she will take 2.5mg  daily till INR check on 10/24.  Pt verbalized understanding.

## 2012-02-11 ENCOUNTER — Ambulatory Visit (INDEPENDENT_AMBULATORY_CARE_PROVIDER_SITE_OTHER): Payer: Medicare Other | Admitting: *Deleted

## 2012-02-11 DIAGNOSIS — I509 Heart failure, unspecified: Secondary | ICD-10-CM

## 2012-02-11 DIAGNOSIS — I2789 Other specified pulmonary heart diseases: Secondary | ICD-10-CM

## 2012-02-11 DIAGNOSIS — I4891 Unspecified atrial fibrillation: Secondary | ICD-10-CM

## 2012-02-11 DIAGNOSIS — Z7901 Long term (current) use of anticoagulants: Secondary | ICD-10-CM

## 2012-02-11 LAB — POCT INR: INR: 3.1

## 2012-02-15 ENCOUNTER — Ambulatory Visit (INDEPENDENT_AMBULATORY_CARE_PROVIDER_SITE_OTHER): Payer: Medicare Other | Admitting: *Deleted

## 2012-02-15 DIAGNOSIS — I2789 Other specified pulmonary heart diseases: Secondary | ICD-10-CM

## 2012-02-15 DIAGNOSIS — I509 Heart failure, unspecified: Secondary | ICD-10-CM

## 2012-02-15 DIAGNOSIS — I4891 Unspecified atrial fibrillation: Secondary | ICD-10-CM

## 2012-02-15 DIAGNOSIS — Z7901 Long term (current) use of anticoagulants: Secondary | ICD-10-CM

## 2012-02-15 LAB — POCT INR: INR: 2.3

## 2012-02-23 ENCOUNTER — Ambulatory Visit: Payer: Medicare Other | Admitting: Cardiology

## 2012-02-25 ENCOUNTER — Ambulatory Visit (INDEPENDENT_AMBULATORY_CARE_PROVIDER_SITE_OTHER): Payer: Medicare Other | Admitting: *Deleted

## 2012-02-25 DIAGNOSIS — I2789 Other specified pulmonary heart diseases: Secondary | ICD-10-CM

## 2012-02-25 DIAGNOSIS — I509 Heart failure, unspecified: Secondary | ICD-10-CM

## 2012-02-25 DIAGNOSIS — I4891 Unspecified atrial fibrillation: Secondary | ICD-10-CM

## 2012-02-25 DIAGNOSIS — Z7901 Long term (current) use of anticoagulants: Secondary | ICD-10-CM

## 2012-03-02 ENCOUNTER — Ambulatory Visit (INDEPENDENT_AMBULATORY_CARE_PROVIDER_SITE_OTHER): Payer: Medicare Other | Admitting: Cardiology

## 2012-03-02 ENCOUNTER — Encounter: Payer: Self-pay | Admitting: Cardiology

## 2012-03-02 VITALS — BP 132/72 | Ht 62.0 in | Wt 176.0 lb

## 2012-03-02 DIAGNOSIS — I2510 Atherosclerotic heart disease of native coronary artery without angina pectoris: Secondary | ICD-10-CM

## 2012-03-02 DIAGNOSIS — I2789 Other specified pulmonary heart diseases: Secondary | ICD-10-CM

## 2012-03-02 DIAGNOSIS — I10 Essential (primary) hypertension: Secondary | ICD-10-CM

## 2012-03-02 DIAGNOSIS — I4891 Unspecified atrial fibrillation: Secondary | ICD-10-CM

## 2012-03-02 LAB — BASIC METABOLIC PANEL
Chloride: 105 mEq/L (ref 96–112)
GFR: 59.02 mL/min — ABNORMAL LOW (ref >60.00)
Potassium: 4.2 mEq/L (ref 3.5–5.1)
Sodium: 142 mEq/L (ref 135–145)

## 2012-03-02 NOTE — Assessment & Plan Note (Signed)
She's been maintained on warfarin anticoagulation. Because of a drug-eluting stent she remains on low-dose aspirin

## 2012-03-02 NOTE — Assessment & Plan Note (Signed)
Has pulmonary hypertension from multiple etiologies. Is been medically managed.

## 2012-03-02 NOTE — Assessment & Plan Note (Signed)
This is currently well controlled at the present time. We will get a basic metabolic profile

## 2012-03-02 NOTE — Progress Notes (Signed)
HPI:  Patient seen today in a followup visit. She is really doing pretty well. She does have some shortness of breath, but things haven't changed all that much. She's been off Plavix, and not had any problems. She did fall of 2 stool, and when that happened she did develop a bruise in her left lower extremity. This slowly gradually improved, and there is no large abrasion.  Current Outpatient Prescriptions  Medication Sig Dispense Refill  . amLODipine-atorvastatin (CADUET) 5-40 MG per tablet Take 1 tablet by mouth daily.        [name] aspirin 81 MG tablet Take 81 mg by mouth daily.        . calcium carbonate 200 MG capsule Take 400 mg by mouth 2 (two) times daily with a meal.       . Cholecalciferol (VITAMIN D) 2000 UNITS CAPS Take 1 capsule by mouth daily.        . folic acid (FOLVITE) 1 MG tablet Take 400 mcg by mouth 2 (two) times daily.       . hydrochlorothiazide (MICROZIDE) 12.5 MG capsule Take 1 capsule (12.5 mg total) by mouth daily.  30 capsule  11  . metoprolol (TOPROL-XL) 200 MG 24 hr tablet Take 200 mg by mouth daily.        . potassium chloride SA (K-DUR,KLOR-CON) 20 MEQ tablet Take 1 tablet (20 mEq total) by mouth daily.  30 tablet  3  . telmisartan-hydrochlorothiazide (MICARDIS HCT) 80-12.5 MG per tablet Take 1 tablet by mouth daily.        [name] warfarin (COUMADIN) 2.5 MG tablet Take 1 tablet (2.5 mg total) by mouth daily.  90 tablet  3    Allergies  Allergen Reactions  . Codeine     REACTION: Nausea    Past Medical History  Diagnosis Date  . Osteoarthritis (arthritis due to wear and tear of joints)   . Pulmonary hypertension   . Hyperlipidemia   . CAD (coronary artery disease)   . Hypertension   . CHF (congestive heart failure)   . Ischemic heart disease   . Heart block   . Syncope   . Ventricular tachycardia     Past Surgical History  Procedure Date  . Coronary angioplasty with stent placement   . Coronary artery bypass graft     times 4  . Cardiac defibrillator  placement     Family History  Problem Relation Age of Onset  . Heart attack Father   . Heart attack Brother     History   Social History  . Marital Status: Widowed    Spouse Name: N/A    Number of Children: 2  . Years of Education: N/A   Occupational History  . retired     Designer, jewellery   Social History Main Topics  . Smoking status: Never Smoker   . Smokeless tobacco: Never Used  . Alcohol Use: No  . Drug Use: No  . Sexually Active: Not on file   Other Topics Concern  . Not on file   Social History Narrative  . No narrative on file    ROS: Please see the HPI.  All other systems reviewed and negative.  PHYSICAL EXAM:  BP 132/72  Ht 5\' 2"  (1.575 m)  Wt 176 lb (79.833 kg)  BMI 32.19 kg/m2  General: Well developed, well nourished, in no acute distress. Head:  Normocephalic and atraumatic. Neck: no JVD Lungs: Clear to auscultation and percussion. Heart: Normal S1 and S2.  Minimal SEM.  No DM.   Pulses: Pulses normal in all 4 extremities. Extremities: No clubbing or cyanosis. No edema.  Slight bruising of th left lower extremity.  Neurologic: Alert and oriented x 3.  EKG:  Av pacing with some pacs.   ASSESSMENT AND PLAN:

## 2012-03-02 NOTE — Patient Instructions (Addendum)
Your physician recommends that you have lab work today: BMP  Your physician recommends that you schedule a follow-up appointment in: MARCH 2014  Your physician recommends that you continue on your current medications as directed. Please refer to the Current Medication list given to you today.   

## 2012-03-02 NOTE — Assessment & Plan Note (Signed)
She's come off of Plavix and not had any problem.

## 2012-03-07 ENCOUNTER — Ambulatory Visit (INDEPENDENT_AMBULATORY_CARE_PROVIDER_SITE_OTHER): Payer: Medicare Other | Admitting: *Deleted

## 2012-03-07 DIAGNOSIS — I2789 Other specified pulmonary heart diseases: Secondary | ICD-10-CM

## 2012-03-07 DIAGNOSIS — I509 Heart failure, unspecified: Secondary | ICD-10-CM

## 2012-03-07 DIAGNOSIS — Z7901 Long term (current) use of anticoagulants: Secondary | ICD-10-CM

## 2012-03-07 DIAGNOSIS — I4891 Unspecified atrial fibrillation: Secondary | ICD-10-CM

## 2012-03-07 LAB — POCT INR: INR: 2.7

## 2012-03-18 ENCOUNTER — Other Ambulatory Visit: Payer: Self-pay

## 2012-03-18 MED ORDER — POTASSIUM CHLORIDE CRYS ER 20 MEQ PO TBCR: 20.0000 meq | EXTENDED_RELEASE_TABLET | Freq: Every day | ORAL | Status: DC

## 2012-03-28 ENCOUNTER — Ambulatory Visit (INDEPENDENT_AMBULATORY_CARE_PROVIDER_SITE_OTHER): Payer: Medicare Other | Admitting: *Deleted

## 2012-03-28 DIAGNOSIS — I2789 Other specified pulmonary heart diseases: Secondary | ICD-10-CM

## 2012-03-28 DIAGNOSIS — Z7901 Long term (current) use of anticoagulants: Secondary | ICD-10-CM

## 2012-03-28 DIAGNOSIS — I509 Heart failure, unspecified: Secondary | ICD-10-CM

## 2012-03-28 DIAGNOSIS — I4891 Unspecified atrial fibrillation: Secondary | ICD-10-CM

## 2012-04-11 ENCOUNTER — Ambulatory Visit (INDEPENDENT_AMBULATORY_CARE_PROVIDER_SITE_OTHER): Payer: Medicare Other | Admitting: *Deleted

## 2012-04-11 ENCOUNTER — Encounter: Payer: Self-pay | Admitting: Internal Medicine

## 2012-04-11 DIAGNOSIS — Z95810 Presence of automatic (implantable) cardiac defibrillator: Secondary | ICD-10-CM

## 2012-04-11 DIAGNOSIS — I4729 Other ventricular tachycardia: Secondary | ICD-10-CM

## 2012-04-11 DIAGNOSIS — I472 Ventricular tachycardia: Secondary | ICD-10-CM

## 2012-04-18 ENCOUNTER — Ambulatory Visit (INDEPENDENT_AMBULATORY_CARE_PROVIDER_SITE_OTHER): Payer: Medicare Other | Admitting: *Deleted

## 2012-04-18 DIAGNOSIS — I509 Heart failure, unspecified: Secondary | ICD-10-CM

## 2012-04-18 DIAGNOSIS — Z7901 Long term (current) use of anticoagulants: Secondary | ICD-10-CM

## 2012-04-18 DIAGNOSIS — I4891 Unspecified atrial fibrillation: Secondary | ICD-10-CM

## 2012-04-18 DIAGNOSIS — I2789 Other specified pulmonary heart diseases: Secondary | ICD-10-CM

## 2012-04-18 LAB — REMOTE ICD DEVICE
AL AMPLITUDE: 2.2 mv
AL IMPEDENCE ICD: 600 Ohm
BAMS-0001: 170 {beats}/min
BATTERY VOLTAGE: 2.65 V
BRDY-0002RA: 60 {beats}/min
CHARGE TIME: 11.861 s
RV LEAD IMPEDENCE ICD: 376 Ohm
TOT-0002: 0
TOT-0006: 20090511000000
TZAT-0001FASTVT: 1
TZAT-0004SLOWVT: 8
TZAT-0005SLOWVT: 88 pct
TZAT-0012ATACH: 150 ms
TZAT-0012SLOWVT: 200 ms
TZAT-0013SLOWVT: 3
TZAT-0018FASTVT: NEGATIVE
TZAT-0019ATACH: 6 V
TZAT-0020ATACH: 1.5 ms
TZAT-0020ATACH: 1.5 ms
TZAT-0020ATACH: 1.5 ms
TZON-0003SLOWVT: 350 ms
TZON-0003VSLOWVT: 450 ms
TZON-0004SLOWVT: 20
TZON-0005SLOWVT: 12
TZST-0001ATACH: 6
TZST-0001FASTVT: 2
TZST-0001FASTVT: 3
TZST-0001FASTVT: 4
TZST-0001FASTVT: 6
TZST-0001SLOWVT: 5
TZST-0002ATACH: NEGATIVE
TZST-0002ATACH: NEGATIVE
TZST-0002FASTVT: NEGATIVE
TZST-0002FASTVT: NEGATIVE
TZST-0003SLOWVT: 35 J
TZST-0003SLOWVT: 35 J
VENTRICULAR PACING ICD: 99.99 pct
VF: 0

## 2012-04-18 LAB — POCT INR: INR: 1.8

## 2012-04-22 ENCOUNTER — Encounter: Payer: Self-pay | Admitting: *Deleted

## 2012-05-04 ENCOUNTER — Ambulatory Visit (INDEPENDENT_AMBULATORY_CARE_PROVIDER_SITE_OTHER): Payer: Medicare PPO | Admitting: *Deleted

## 2012-05-04 DIAGNOSIS — I509 Heart failure, unspecified: Secondary | ICD-10-CM

## 2012-05-04 DIAGNOSIS — I4891 Unspecified atrial fibrillation: Secondary | ICD-10-CM

## 2012-05-04 DIAGNOSIS — Z7901 Long term (current) use of anticoagulants: Secondary | ICD-10-CM

## 2012-05-04 DIAGNOSIS — I2789 Other specified pulmonary heart diseases: Secondary | ICD-10-CM

## 2012-05-04 LAB — POCT INR: INR: 2.1

## 2012-05-11 ENCOUNTER — Other Ambulatory Visit (HOSPITAL_COMMUNITY): Payer: Self-pay | Admitting: Pulmonary Disease

## 2012-05-11 ENCOUNTER — Telehealth: Payer: Self-pay | Admitting: Cardiology

## 2012-05-11 ENCOUNTER — Ambulatory Visit (HOSPITAL_COMMUNITY)
Admission: RE | Admit: 2012-05-11 | Discharge: 2012-05-11 | Disposition: A | Discharge status: Home / Self Care | Payer: Medicare PPO | Source: Ambulatory Visit | Attending: Pulmonary Disease | Admitting: Pulmonary Disease

## 2012-05-11 DIAGNOSIS — M25552 Pain in left hip: Secondary | ICD-10-CM

## 2012-05-11 DIAGNOSIS — M25559 Pain in unspecified hip: Secondary | ICD-10-CM | POA: Insufficient documentation

## 2012-05-11 DIAGNOSIS — M545 Low back pain, unspecified: Secondary | ICD-10-CM | POA: Insufficient documentation

## 2012-05-11 DIAGNOSIS — M5137 Other intervertebral disc degeneration, lumbosacral region: Secondary | ICD-10-CM | POA: Insufficient documentation

## 2012-05-11 DIAGNOSIS — M25551 Pain in right hip: Secondary | ICD-10-CM

## 2012-05-11 DIAGNOSIS — M51379 Other intervertebral disc degeneration, lumbosacral region without mention of lumbar back pain or lower extremity pain: Secondary | ICD-10-CM | POA: Insufficient documentation

## 2012-05-11 NOTE — Telephone Encounter (Signed)
PT WENT TO THE DOCTOR TODAY AND WAS PUT ON MEDICATION. SHE WANTS TO MAKE SURE HER MEDICATION DOES NOT REACT OR EFFECT HER TAKING HER REGULAR DOSE OF COUMADIN. THANKS! ANL

## 2012-05-12 NOTE — Telephone Encounter (Signed)
Called pt.  She has prednisone 4mg  taper pk.  Told pt to finish taper pack, eat extra greens and come for INR check on 1/29.  Last INR was 2.1.  She verbalized understanding.

## 2012-05-19 ENCOUNTER — Ambulatory Visit (INDEPENDENT_AMBULATORY_CARE_PROVIDER_SITE_OTHER): Payer: Medicare PPO | Admitting: *Deleted

## 2012-05-19 DIAGNOSIS — I2789 Other specified pulmonary heart diseases: Secondary | ICD-10-CM

## 2012-05-19 DIAGNOSIS — Z7901 Long term (current) use of anticoagulants: Secondary | ICD-10-CM

## 2012-05-19 DIAGNOSIS — I509 Heart failure, unspecified: Secondary | ICD-10-CM

## 2012-05-19 DIAGNOSIS — I4891 Unspecified atrial fibrillation: Secondary | ICD-10-CM

## 2012-05-19 LAB — POCT INR: INR: 2.9

## 2012-06-09 ENCOUNTER — Ambulatory Visit (INDEPENDENT_AMBULATORY_CARE_PROVIDER_SITE_OTHER): Payer: Medicare PPO | Admitting: *Deleted

## 2012-06-09 DIAGNOSIS — Z7901 Long term (current) use of anticoagulants: Secondary | ICD-10-CM

## 2012-06-09 DIAGNOSIS — I2789 Other specified pulmonary heart diseases: Secondary | ICD-10-CM

## 2012-06-09 DIAGNOSIS — I509 Heart failure, unspecified: Secondary | ICD-10-CM

## 2012-06-09 DIAGNOSIS — I4891 Unspecified atrial fibrillation: Secondary | ICD-10-CM

## 2012-07-04 ENCOUNTER — Telehealth: Payer: Self-pay | Admitting: *Deleted

## 2012-07-04 NOTE — Telephone Encounter (Signed)
Patient asking if dose of coumadin should stay the same, she rescheduled to [facility].

## 2012-07-04 NOTE — Telephone Encounter (Signed)
Told pt to continue current dose till INR check 3/19.  She verbalized understanding.

## 2012-07-06 ENCOUNTER — Ambulatory Visit (INDEPENDENT_AMBULATORY_CARE_PROVIDER_SITE_OTHER): Payer: Medicare PPO | Admitting: *Deleted

## 2012-07-06 DIAGNOSIS — Z7901 Long term (current) use of anticoagulants: Secondary | ICD-10-CM

## 2012-07-06 DIAGNOSIS — I4891 Unspecified atrial fibrillation: Secondary | ICD-10-CM

## 2012-07-06 DIAGNOSIS — I2789 Other specified pulmonary heart diseases: Secondary | ICD-10-CM

## 2012-07-06 DIAGNOSIS — I509 Heart failure, unspecified: Secondary | ICD-10-CM

## 2012-07-07 ENCOUNTER — Encounter: Payer: Self-pay | Admitting: Cardiology

## 2012-07-07 ENCOUNTER — Ambulatory Visit (INDEPENDENT_AMBULATORY_CARE_PROVIDER_SITE_OTHER): Payer: Medicare PPO | Admitting: Cardiology

## 2012-07-07 VITALS — BP 148/77 | HR 62 | Ht 61.0 in | Wt 172.0 lb

## 2012-07-07 DIAGNOSIS — I2510 Atherosclerotic heart disease of native coronary artery without angina pectoris: Secondary | ICD-10-CM

## 2012-07-07 DIAGNOSIS — Z95810 Presence of automatic (implantable) cardiac defibrillator: Secondary | ICD-10-CM

## 2012-07-07 LAB — BASIC METABOLIC PANEL
BUN: 27 mg/dL — ABNORMAL HIGH (ref 6–23)
Calcium: 9.6 mg/dL (ref 8.4–10.5)
Creatinine, Ser: 1.1 mg/dL (ref 0.4–1.2)
GFR: 52.66 mL/min — ABNORMAL LOW (ref >60.00)
Glucose, Bld: 106 mg/dL — ABNORMAL HIGH (ref 70–99)
Sodium: 141 mEq/L (ref 135–145)

## 2012-07-07 LAB — CBC
MCHC: 33.5 g/dL (ref 30.0–36.0)
RDW: 15.7 % — ABNORMAL HIGH (ref 11.5–14.6)

## 2012-07-07 NOTE — Patient Instructions (Signed)
Your physician has requested that you have an echocardiogram. Echocardiography is a painless test that uses sound waves to create images of your heart. It provides your doctor with information about the size and shape of your heart and how well your heart's chambers and valves are working. This procedure takes approximately one hour. There are no restrictions for this procedure.  Your physician recommends that you have lab work today: BMP and CBC  Your physician recommends that you schedule a follow-up appointment in: 3 MONTHS with Dr Diona Browner in the [place] office (previous pt of Dr Riley Kill)

## 2012-07-07 NOTE — Progress Notes (Signed)
HPI:   This very nice patient is in for followup today. She's getting along reasonably well, although she does note some mild progression in her shortness of breath. There've been no other major changes, and no chest pain in the interim. She seems to be tolerating her medicines well, but does bruise easily on the warfarin. We discussed anticoagulation options with her in detail today.   Current Outpatient Prescriptions  Medication Sig Dispense Refill  . amLODipine-atorvastatin (CADUET) 5-40 MG per tablet Take 1 tablet by mouth daily.        [name] aspirin 81 MG tablet Take 81 mg by mouth daily.        . calcium carbonate 200 MG capsule Take 400 mg by mouth 2 (two) times daily with a meal.       . Cholecalciferol (VITAMIN D) 2000 UNITS CAPS Take 1 capsule by mouth daily.        . folic acid (FOLVITE) 1 MG tablet Take 400 mcg by mouth 2 (two) times daily.       . hydrochlorothiazide (MICROZIDE) 12.5 MG capsule Take 1 capsule (12.5 mg total) by mouth daily.  30 capsule  11  . methocarbamol (ROBAXIN) 500 MG tablet If needed for back pain      . metoprolol (TOPROL-XL) 200 MG 24 hr tablet Take 200 mg by mouth daily.        . potassium chloride SA (K-DUR,KLOR-CON) 20 MEQ tablet Take 1 tablet (20 mEq total) by mouth daily.  30 tablet  5  . telmisartan-hydrochlorothiazide (MICARDIS HCT) 80-12.5 MG per tablet Take 1 tablet by mouth daily.        [name] warfarin (COUMADIN) 2.5 MG tablet Take 1 tablet (2.5 mg total) by mouth daily.  90 tablet  3   No current facility-administered medications for this visit.    Allergies  Allergen Reactions  . Codeine     REACTION: Nausea    Past Medical History  Diagnosis Date  . Osteoarthritis (arthritis due to wear and tear of joints)   . Pulmonary hypertension   . Hyperlipidemia   . CAD (coronary artery disease)   . Hypertension   . CHF (congestive heart failure)   . Ischemic heart disease   . Heart block   . Syncope   . Ventricular tachycardia     Past  Surgical History  Procedure Laterality Date  . Coronary angioplasty with stent placement    . Coronary artery bypass graft      times 4  . Cardiac defibrillator placement      Family History  Problem Relation Age of Onset  . Heart attack Father   . Heart attack Brother     History   Social History  . Marital Status: Widowed    Spouse Name: N/A    Number of Children: 2  . Years of Education: N/A   Occupational History  . retired     Designer, jewellery   Social History Main Topics  . Smoking status: Never Smoker   . Smokeless tobacco: Never Used  . Alcohol Use: No  . Drug Use: No  . Sexually Active: Not on file   Other Topics Concern  . Not on file   Social History Narrative  . No narrative on file    ROS: Please see the HPI.  All other systems reviewed and negative.  PHYSICAL EXAM:  BP 148/77  Pulse 62  Ht 5\' 1"  (1.549 m)  Wt 172 lb (78.019 kg)  BMI  32.52 kg/m2  General: Well developed, well nourished, in no acute distress. Head:  Normocephalic and atraumatic. Neck: no JVD Lungs: Clear to auscultation and percussion. Heart: Normal S1 and paradoxical S2 Pulses: Pulses normal in all 4 extremities. Extremities: No clubbing or cyanosis. No edema.  SLightly more edema of the R leg.   Neurologic: Alert and oriented x 3.  EKG:  Atrial tracking and ventricular pacing.    ASSESSMENT AND PLAN:  1.  CAD   2.  Sp Defib with pacing. 3.  HTN 4.  Prob diastolic CHF   Plan  Labs, RTC Dr. Diona Browner in three months.

## 2012-07-08 ENCOUNTER — Telehealth: Payer: Self-pay | Admitting: Cardiology

## 2012-07-08 NOTE — Telephone Encounter (Signed)
New Problem:    Patient called in returning your call. Please call back. 

## 2012-07-08 NOTE — Telephone Encounter (Signed)
Lab results were given to pt. 

## 2012-07-10 NOTE — Assessment & Plan Note (Signed)
Prob from moderate diastolic dysfunction, HTN, and other sources.  Medical treatment.

## 2012-07-10 NOTE — Assessment & Plan Note (Signed)
Had last cath in 2006.  At that time mild pulm HTN, and also patent grafts to the LAD, Diag, and ramus/OM.  Progression of disease in the PLA with polymorphic VT.  Had Taxus DES to the PLA, and remained on DAPT until started on warfarin for af.  Remains stable.

## 2012-07-10 NOTE — Assessment & Plan Note (Signed)
History of both heart block and polymorphic VT. Followed by Dr. Ladona Ridgel.  Has done well.

## 2012-07-10 NOTE — Assessment & Plan Note (Signed)
Currently on warfarin.  Have discussed treatment options.  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

## 2012-07-10 NOTE — Assessment & Plan Note (Signed)
Moderate control on medical therapy.

## 2012-07-13 ENCOUNTER — Other Ambulatory Visit (HOSPITAL_COMMUNITY): Payer: Medicare PPO

## 2012-07-15 ENCOUNTER — Ambulatory Visit (HOSPITAL_COMMUNITY): Payer: Medicare PPO | Attending: Cardiology | Admitting: Radiology

## 2012-07-15 ENCOUNTER — Other Ambulatory Visit (HOSPITAL_COMMUNITY): Payer: Medicare PPO

## 2012-07-15 DIAGNOSIS — I2589 Other forms of chronic ischemic heart disease: Secondary | ICD-10-CM | POA: Insufficient documentation

## 2012-07-15 DIAGNOSIS — I359 Nonrheumatic aortic valve disorder, unspecified: Secondary | ICD-10-CM | POA: Insufficient documentation

## 2012-07-15 DIAGNOSIS — R0609 Other forms of dyspnea: Secondary | ICD-10-CM | POA: Insufficient documentation

## 2012-07-15 DIAGNOSIS — R0602 Shortness of breath: Secondary | ICD-10-CM

## 2012-07-15 DIAGNOSIS — I2510 Atherosclerotic heart disease of native coronary artery without angina pectoris: Secondary | ICD-10-CM

## 2012-07-15 DIAGNOSIS — R0989 Other specified symptoms and signs involving the circulatory and respiratory systems: Secondary | ICD-10-CM | POA: Insufficient documentation

## 2012-07-15 DIAGNOSIS — I509 Heart failure, unspecified: Secondary | ICD-10-CM | POA: Insufficient documentation

## 2012-07-15 DIAGNOSIS — E669 Obesity, unspecified: Secondary | ICD-10-CM | POA: Insufficient documentation

## 2012-07-15 DIAGNOSIS — E785 Hyperlipidemia, unspecified: Secondary | ICD-10-CM | POA: Insufficient documentation

## 2012-07-15 DIAGNOSIS — I10 Essential (primary) hypertension: Secondary | ICD-10-CM | POA: Insufficient documentation

## 2012-07-15 NOTE — Progress Notes (Signed)
Echocardiogram performed.  

## 2012-07-18 ENCOUNTER — Ambulatory Visit (INDEPENDENT_AMBULATORY_CARE_PROVIDER_SITE_OTHER): Payer: Medicare PPO | Admitting: *Deleted

## 2012-07-18 ENCOUNTER — Other Ambulatory Visit: Payer: Self-pay | Admitting: Internal Medicine

## 2012-07-18 DIAGNOSIS — I4729 Other ventricular tachycardia: Secondary | ICD-10-CM

## 2012-07-18 DIAGNOSIS — Z95810 Presence of automatic (implantable) cardiac defibrillator: Secondary | ICD-10-CM

## 2012-07-18 DIAGNOSIS — I472 Ventricular tachycardia: Secondary | ICD-10-CM

## 2012-07-24 LAB — REMOTE ICD DEVICE
AL AMPLITUDE: 1.9 mv
AL IMPEDENCE ICD: 488 Ohm
BRDY-0003RA: 130 {beats}/min
BRDY-0004RA: 130 {beats}/min
FVT: 0
RV LEAD IMPEDENCE ICD: 368 Ohm
TOT-0006: 20090511000000
TZAT-0001ATACH: 1
TZAT-0001ATACH: 2
TZAT-0001ATACH: 3
TZAT-0002ATACH: NEGATIVE
TZAT-0002ATACH: NEGATIVE
TZAT-0002FASTVT: NEGATIVE
TZAT-0005SLOWVT: 88 pct
TZAT-0011SLOWVT: 10 ms
TZAT-0012ATACH: 150 ms
TZAT-0012ATACH: 150 ms
TZAT-0012ATACH: 150 ms
TZAT-0012SLOWVT: 200 ms
TZAT-0013SLOWVT: 3
TZAT-0018ATACH: NEGATIVE
TZAT-0018ATACH: NEGATIVE
TZAT-0018SLOWVT: NEGATIVE
TZAT-0019ATACH: 6 V
TZAT-0019FASTVT: 8 V
TZAT-0019SLOWVT: 8 V
TZAT-0020ATACH: 1.5 ms
TZAT-0020FASTVT: 1.5 ms
TZON-0003ATACH: 350 ms
TZON-0003SLOWVT: 350 ms
TZON-0003VSLOWVT: 450 ms
TZON-0004SLOWVT: 20
TZON-0004VSLOWVT: 24
TZON-0005SLOWVT: 12
TZST-0001ATACH: 6
TZST-0001FASTVT: 3
TZST-0001FASTVT: 5
TZST-0001SLOWVT: 3
TZST-0001SLOWVT: 5
TZST-0001SLOWVT: 6
TZST-0002ATACH: NEGATIVE
TZST-0002FASTVT: NEGATIVE
TZST-0002FASTVT: NEGATIVE
TZST-0002FASTVT: NEGATIVE
TZST-0003SLOWVT: 35 J
TZST-0003SLOWVT: 35 J
TZST-0003SLOWVT: 35 J
VF: 0

## 2012-08-02 ENCOUNTER — Other Ambulatory Visit: Payer: Self-pay

## 2012-08-02 MED ORDER — WARFARIN SODIUM 2.5 MG PO TABS: ORAL_TABLET | ORAL | Status: DC

## 2012-08-03 ENCOUNTER — Ambulatory Visit (INDEPENDENT_AMBULATORY_CARE_PROVIDER_SITE_OTHER): Payer: Medicare PPO | Admitting: *Deleted

## 2012-08-03 DIAGNOSIS — Z7901 Long term (current) use of anticoagulants: Secondary | ICD-10-CM

## 2012-08-03 DIAGNOSIS — I2789 Other specified pulmonary heart diseases: Secondary | ICD-10-CM

## 2012-08-03 DIAGNOSIS — I4891 Unspecified atrial fibrillation: Secondary | ICD-10-CM

## 2012-08-03 DIAGNOSIS — I509 Heart failure, unspecified: Secondary | ICD-10-CM

## 2012-08-03 LAB — POCT INR: INR: 2.3

## 2012-08-12 ENCOUNTER — Other Ambulatory Visit (HOSPITAL_COMMUNITY): Payer: Self-pay | Admitting: Pulmonary Disease

## 2012-08-12 DIAGNOSIS — M1990 Unspecified osteoarthritis, unspecified site: Secondary | ICD-10-CM

## 2012-08-16 ENCOUNTER — Encounter: Payer: Self-pay | Admitting: *Deleted

## 2012-08-17 ENCOUNTER — Encounter: Payer: Self-pay | Admitting: Internal Medicine

## 2012-08-17 ENCOUNTER — Ambulatory Visit (HOSPITAL_COMMUNITY)
Admission: RE | Admit: 2012-08-17 | Discharge: 2012-08-17 | Disposition: A | Discharge status: Home / Self Care | Payer: Medicare PPO | Source: Ambulatory Visit | Attending: Pulmonary Disease | Admitting: Pulmonary Disease

## 2012-08-17 DIAGNOSIS — M949 Disorder of cartilage, unspecified: Secondary | ICD-10-CM | POA: Insufficient documentation

## 2012-08-17 DIAGNOSIS — M1990 Unspecified osteoarthritis, unspecified site: Secondary | ICD-10-CM

## 2012-08-17 DIAGNOSIS — Z780 Asymptomatic menopausal state: Secondary | ICD-10-CM | POA: Insufficient documentation

## 2012-08-17 DIAGNOSIS — M899 Disorder of bone, unspecified: Secondary | ICD-10-CM | POA: Insufficient documentation

## 2012-08-17 LAB — HM DEXA SCAN

## 2012-08-31 ENCOUNTER — Ambulatory Visit (INDEPENDENT_AMBULATORY_CARE_PROVIDER_SITE_OTHER): Payer: Medicare PPO | Admitting: *Deleted

## 2012-08-31 DIAGNOSIS — Z7901 Long term (current) use of anticoagulants: Secondary | ICD-10-CM

## 2012-08-31 DIAGNOSIS — I2789 Other specified pulmonary heart diseases: Secondary | ICD-10-CM

## 2012-08-31 DIAGNOSIS — I509 Heart failure, unspecified: Secondary | ICD-10-CM

## 2012-08-31 DIAGNOSIS — I4891 Unspecified atrial fibrillation: Secondary | ICD-10-CM

## 2012-08-31 LAB — POCT INR: INR: 1.8

## 2012-09-15 ENCOUNTER — Other Ambulatory Visit: Payer: Self-pay

## 2012-09-15 MED ORDER — POTASSIUM CHLORIDE CRYS ER 20 MEQ PO TBCR: 20.0000 meq | EXTENDED_RELEASE_TABLET | Freq: Every day | ORAL | Status: DC

## 2012-09-20 ENCOUNTER — Telehealth: Payer: Self-pay | Admitting: Internal Medicine

## 2012-09-20 NOTE — Telephone Encounter (Signed)
Follow up  Pt states she would rather speak with Dr Ladona Ridgel if that is possible.

## 2012-09-20 NOTE — Telephone Encounter (Signed)
New problem   Pt having a procedure tomorrow and wants to make sure the procedure is going to be done correctly

## 2012-09-20 NOTE — Telephone Encounter (Signed)
Moved her appointment to 10/19/12 Ok to have cataract done

## 2012-09-22 ENCOUNTER — Ambulatory Visit (INDEPENDENT_AMBULATORY_CARE_PROVIDER_SITE_OTHER): Payer: Medicare PPO | Admitting: *Deleted

## 2012-09-22 DIAGNOSIS — I4891 Unspecified atrial fibrillation: Secondary | ICD-10-CM

## 2012-09-22 DIAGNOSIS — Z7901 Long term (current) use of anticoagulants: Secondary | ICD-10-CM

## 2012-09-22 DIAGNOSIS — I2789 Other specified pulmonary heart diseases: Secondary | ICD-10-CM

## 2012-09-22 DIAGNOSIS — I509 Heart failure, unspecified: Secondary | ICD-10-CM

## 2012-09-22 LAB — POCT INR: INR: 1.6

## 2012-10-05 ENCOUNTER — Ambulatory Visit (INDEPENDENT_AMBULATORY_CARE_PROVIDER_SITE_OTHER): Payer: Medicare PPO | Admitting: *Deleted

## 2012-10-05 DIAGNOSIS — I2789 Other specified pulmonary heart diseases: Secondary | ICD-10-CM

## 2012-10-05 DIAGNOSIS — Z7901 Long term (current) use of anticoagulants: Secondary | ICD-10-CM

## 2012-10-05 DIAGNOSIS — I509 Heart failure, unspecified: Secondary | ICD-10-CM

## 2012-10-05 DIAGNOSIS — I4891 Unspecified atrial fibrillation: Secondary | ICD-10-CM

## 2012-10-05 LAB — POCT INR: INR: 2.4

## 2012-10-10 ENCOUNTER — Ambulatory Visit: Payer: Medicare PPO | Admitting: Cardiology

## 2012-10-12 ENCOUNTER — Encounter: Payer: Medicare PPO | Admitting: Internal Medicine

## 2012-10-18 ENCOUNTER — Encounter: Payer: Self-pay | Admitting: Cardiology

## 2012-10-18 ENCOUNTER — Ambulatory Visit (INDEPENDENT_AMBULATORY_CARE_PROVIDER_SITE_OTHER): Payer: Medicare PPO | Admitting: Cardiology

## 2012-10-18 ENCOUNTER — Other Ambulatory Visit: Payer: Self-pay | Admitting: *Deleted

## 2012-10-18 VITALS — BP 124/72 | HR 64 | Ht 61.0 in | Wt 172.0 lb

## 2012-10-18 DIAGNOSIS — E782 Mixed hyperlipidemia: Secondary | ICD-10-CM

## 2012-10-18 DIAGNOSIS — I10 Essential (primary) hypertension: Secondary | ICD-10-CM

## 2012-10-18 DIAGNOSIS — I350 Nonrheumatic aortic (valve) stenosis: Secondary | ICD-10-CM

## 2012-10-18 DIAGNOSIS — I4891 Unspecified atrial fibrillation: Secondary | ICD-10-CM

## 2012-10-18 DIAGNOSIS — I359 Nonrheumatic aortic valve disorder, unspecified: Secondary | ICD-10-CM

## 2012-10-18 DIAGNOSIS — I2510 Atherosclerotic heart disease of native coronary artery without angina pectoris: Secondary | ICD-10-CM

## 2012-10-18 DIAGNOSIS — Z95810 Presence of automatic (implantable) cardiac defibrillator: Secondary | ICD-10-CM

## 2012-10-18 NOTE — Assessment & Plan Note (Signed)
Her blood pressure control is good today.

## 2012-10-18 NOTE — Assessment & Plan Note (Signed)
Check FLP and LFT for next visit.

## 2012-10-18 NOTE — Assessment & Plan Note (Signed)
Symptomatically stable on medical therapy. Last ECG reviewed. Most recent intervention was DES to PLA in March of 2006. No longer on Plavix. I will plan to see her back in 3 months with followup lab work and ECG.

## 2012-10-18 NOTE — Patient Instructions (Addendum)
Your physician recommends that you schedule a follow-up appointment in: 3 MONTHS WITH DR SM  Your physician recommends that you return for lab work in: PRIOR TO 3 MONTH VISIT FOR BMET,CBC,L/L Your physician recommends that you have follow up lab work, we will mail you a reminder letter to alert you when to go [employer], located across the street from our office.

## 2012-10-18 NOTE — Assessment & Plan Note (Signed)
Mild by echocardiogram in March, mean gradient 11 mm mercury. Not symptomatic.

## 2012-10-18 NOTE — Assessment & Plan Note (Signed)
History of ischemic cardiac myopathy, LVEF 55-60% by echocardiogram in March, grade 2 diastolic dysfunction. She has device check with Dr. Ladona Ridgel scheduled this week.

## 2012-10-18 NOTE — Progress Notes (Signed)
Clinical Summary [name] [name] is a medically complex 79 y.o.female presenting for an office visit. She is a former long-term patient of Dr. Riley Kill, last seen in March of this year. I reviewed her history. Overall, she states that she has been feeling fairly well, chronic dyspnea on exertion, although no change. She has had no angina symptoms.  Dr. Rosalyn Charters most recent notes indicate discussion about possibly pursuing a sleep study. We did discuss this today, she remains hesitant.  Lab work from March revealed potassium 4.1, BUN 37, creatinine 1.1, hemoglobin 12.7, platelets 244. No recent lipids.  She has a history of ischemic cardiomyopathy with history of heart block and then subsequently VT status post ICD placement (followed by Dr. Ladona Ridgel), although with subsequent normalization of LV function. Echocardiogram from March of this year revealed mild LVH with LVEF 55-60%, grade 2 diastolic dysfunction, mild aortic stenosis with mean gradient 11 mm mercury, mild mitral regurgitation, mild left atrial enlargement, device wire in the right heart, mild right atrial enlargement, PASP approximately 50 mm mercury. She will be seeing Dr. Ladona Ridgel tomorrow for device interrogation. She has had no device shocks.  She reports no major bleeding problems on Coumadin, although does bruise easily.   Allergies  Allergen Reactions  . Codeine     REACTION: Nausea    Current Outpatient Prescriptions  Medication Sig Dispense Refill  . amLODipine-atorvastatin (CADUET) 5-40 MG per tablet Take 1 tablet by mouth daily.        [name] aspirin 81 MG tablet Take 81 mg by mouth daily.        . calcium carbonate 200 MG capsule Take 400 mg by mouth 2 (two) times daily with a meal.       . Cholecalciferol (VITAMIN D) 2000 UNITS CAPS Take 1 capsule by mouth daily.        . folic acid (FOLVITE) 1 MG tablet Take 400 mcg by mouth 2 (two) times daily.       . magnesium oxide (MAG-OX) 400 MG tablet Take 400 mg by mouth daily.      .  methocarbamol (ROBAXIN) 500 MG tablet If needed for back pain      . metoprolol (TOPROL-XL) 200 MG 24 hr tablet Take 200 mg by mouth daily.        . potassium chloride SA (K-DUR,KLOR-CON) 20 MEQ tablet Take 1 tablet (20 mEq total) by mouth daily.  30 tablet  11  . warfarin (COUMADIN) 2.5 MG tablet Take as directed by anticoagulation clinic  90 tablet  3  . hydrochlorothiazide (MICROZIDE) 12.5 MG capsule Take 1 capsule (12.5 mg total) by mouth daily.  30 capsule  11  . telmisartan-hydrochlorothiazide (MICARDIS HCT) 80-12.5 MG per tablet Take 1 tablet by mouth daily.         No current facility-administered medications for this visit.    Past Medical History  Diagnosis Date  . Osteoarthritis   . Mixed hyperlipidemia   . Coronary atherosclerosis of native coronary artery     Multivessel status post CABG, DES PLA March 2006  . Essential hypertension, benign   . Heart block   . Syncope   . Ventricular tachycardia     Status post ICD  . Ischemic cardiomyopathy     LVEF 55% as of March 2014  . Atrial fibrillation     Past Surgical History  Procedure Laterality Date  . Coronary artery bypass graft      LIMA to LAD, SVG to diagonal, SVG to ramus and  OM  . Cardiac defibrillator placement      Guidant device    Family History  Problem Relation Age of Onset  . Heart attack Father   . Heart attack Brother     Social History [name] [name] reports that she has never smoked. She has never used smokeless tobacco. [name] [name] reports that she does not drink alcohol.  Review of Systems No palpitations, no orthopnea or PND. Weight has been stable. Stable appetite. Otherwise negative except as outlined.  Physical Examination Filed Vitals:   10/18/12 1305  BP: 124/72  Pulse: 64   Filed Weights   10/18/12 1305  Weight: 172 lb (78.019 kg)   Comfortable at rest. HEENT: Conjunctiva and lids normal, oropharynx clear. Neck: Supple, no elevated JVP or carotid bruits, no thyromegaly. Lungs:  Clear to auscultation, nonlabored breathing at rest. Cardiac: Regular rate and rhythm, no S3, soft systolic murmur, no pericardial rub. Abdomen: Soft, nontender, bowel sounds present, resolving ecchymosis posterior lower back area - states she ran into a door. Extremities: No pitting edema, distal pulses 2+. Skin: Warm and dry. Musculoskeletal: No kyphosis. Neuropsychiatric: Alert and oriented x3, affect grossly appropriate.   Problem List and Plan   Coronary atherosclerosis of native coronary artery Symptomatically stable on medical therapy. Last ECG reviewed. Most recent intervention was DES to PLA in March of 2006. No longer on Plavix. I will plan to see her back in 3 months with followup lab work and ECG.  IMPLANTATION OF DEFIBRILLATOR, HX OF History of ischemic cardiac myopathy, LVEF 55-60% by echocardiogram in March, grade 2 diastolic dysfunction. She has device check with Dr. Ladona Ridgel scheduled this week.  Atrial fibrillation Not symptomatic in terms of palpitations, noted by prior device interrogation, and on Coumadin.  Essential hypertension, benign Her blood pressure control is good today.  Mixed hyperlipidemia Check FLP and LFT for next visit.  Aortic stenosis Mild by echocardiogram in March, mean gradient 11 mm mercury. Not symptomatic.    Jonelle Sidle, M.D., F.A.C.C.

## 2012-10-18 NOTE — Assessment & Plan Note (Signed)
Not symptomatic in terms of palpitations, noted by prior device interrogation, and on Coumadin.

## 2012-10-19 ENCOUNTER — Ambulatory Visit (INDEPENDENT_AMBULATORY_CARE_PROVIDER_SITE_OTHER): Payer: Medicare PPO | Admitting: *Deleted

## 2012-10-19 ENCOUNTER — Encounter: Payer: Self-pay | Admitting: Internal Medicine

## 2012-10-19 ENCOUNTER — Ambulatory Visit (INDEPENDENT_AMBULATORY_CARE_PROVIDER_SITE_OTHER): Payer: Medicare PPO | Admitting: Internal Medicine

## 2012-10-19 ENCOUNTER — Other Ambulatory Visit: Payer: Self-pay

## 2012-10-19 VITALS — BP 132/62 | HR 67 | Ht 61.0 in | Wt 176.8 lb

## 2012-10-19 DIAGNOSIS — I5022 Chronic systolic (congestive) heart failure: Secondary | ICD-10-CM

## 2012-10-19 DIAGNOSIS — I2510 Atherosclerotic heart disease of native coronary artery without angina pectoris: Secondary | ICD-10-CM

## 2012-10-19 DIAGNOSIS — Z7901 Long term (current) use of anticoagulants: Secondary | ICD-10-CM

## 2012-10-19 DIAGNOSIS — I2789 Other specified pulmonary heart diseases: Secondary | ICD-10-CM

## 2012-10-19 DIAGNOSIS — I472 Ventricular tachycardia: Secondary | ICD-10-CM

## 2012-10-19 DIAGNOSIS — I4891 Unspecified atrial fibrillation: Secondary | ICD-10-CM

## 2012-10-19 DIAGNOSIS — I509 Heart failure, unspecified: Secondary | ICD-10-CM

## 2012-10-19 DIAGNOSIS — Z95810 Presence of automatic (implantable) cardiac defibrillator: Secondary | ICD-10-CM

## 2012-10-19 DIAGNOSIS — I10 Essential (primary) hypertension: Secondary | ICD-10-CM

## 2012-10-19 LAB — ICD DEVICE OBSERVATION
AL THRESHOLD: 1 V
BAMS-0001: 170 {beats}/min
BATTERY VOLTAGE: 2.62 V
CHARGE TIME: 12.812 s
DEV-0020ICD: NEGATIVE
FVT: 0
PACEART VT: 0
RV LEAD THRESHOLD: 1 V
TOT-0002: 0
TZAT-0001ATACH: 2
TZAT-0001ATACH: 3
TZAT-0002ATACH: NEGATIVE
TZAT-0005SLOWVT: 88 pct
TZAT-0011SLOWVT: 10 ms
TZAT-0012ATACH: 150 ms
TZAT-0012ATACH: 150 ms
TZAT-0012SLOWVT: 200 ms
TZAT-0018ATACH: NEGATIVE
TZAT-0018ATACH: NEGATIVE
TZAT-0018ATACH: NEGATIVE
TZAT-0018SLOWVT: NEGATIVE
TZAT-0019ATACH: 6 V
TZAT-0019FASTVT: 8 V
TZAT-0019SLOWVT: 8 V
TZAT-0020ATACH: 1.5 ms
TZAT-0020FASTVT: 1.5 ms
TZON-0003SLOWVT: 350 ms
TZON-0003VSLOWVT: 450 ms
TZON-0004VSLOWVT: 24
TZON-0005SLOWVT: 12
TZST-0001ATACH: 6
TZST-0001FASTVT: 2
TZST-0001FASTVT: 3
TZST-0001FASTVT: 5
TZST-0001SLOWVT: 3
TZST-0001SLOWVT: 5
TZST-0002ATACH: NEGATIVE
TZST-0002ATACH: NEGATIVE
TZST-0002FASTVT: NEGATIVE
TZST-0002FASTVT: NEGATIVE
TZST-0002FASTVT: NEGATIVE
TZST-0003SLOWVT: 20 J
TZST-0003SLOWVT: 35 J
TZST-0003SLOWVT: 35 J
VENTRICULAR PACING ICD: 99.98 pct

## 2012-10-19 LAB — POCT INR: INR: 2.8

## 2012-10-19 MED ORDER — HYDROCHLOROTHIAZIDE 12.5 MG PO CAPS: 12.5000 mg | ORAL_CAPSULE | Freq: Every day | ORAL | Status: DC

## 2012-10-19 NOTE — Patient Instructions (Addendum)
Your physician recommends that you schedule a follow-up appointment with Dr Ladona Ridgel in 1 year. You will receive a reminder letter in the mail in about 10 months reminding you to call and schedule your appointment. If you don't receive this letter, please contact our office. Device Check on 11-21-12.

## 2012-10-21 ENCOUNTER — Encounter: Payer: Self-pay | Admitting: Internal Medicine

## 2012-10-21 NOTE — Assessment & Plan Note (Signed)
She has had no recurrent sustained ventricular arrhythmias. No change in medical therapy.

## 2012-10-21 NOTE — Assessment & Plan Note (Signed)
Her Medtronic DDD ICD is working normally. Will recheck in a month as she is very close to ERI.

## 2012-10-21 NOTE — Progress Notes (Signed)
HPI [name] [name] returns today for followup. She is a pleasant 79 yo woman with a h/o CAD, s/p CABG, CHB, s/p PPM who developed VF and syncope all documented by her PPM. She ultimately underwent insertion of an ICD and has done well. She is approaching ERI on her device. In the interim, she denies chest pain or edema. She has minimal sob with exertion (class 2). No ICD shock. Allergies  Allergen Reactions  . Codeine     REACTION: Nausea     Current Outpatient Prescriptions  Medication Sig Dispense Refill  . amLODipine-atorvastatin (CADUET) 5-40 MG per tablet Take 1 tablet by mouth daily.        [name] aspirin 81 MG tablet Take 81 mg by mouth daily.        . calcium carbonate 200 MG capsule Take 400 mg by mouth 2 (two) times daily with a meal.       . Cholecalciferol (VITAMIN D) 2000 UNITS CAPS Take 1 capsule by mouth daily.        . folic acid (FOLVITE) 1 MG tablet Take 400 mcg by mouth 2 (two) times daily.       . magnesium oxide (MAG-OX) 400 MG tablet Take 400 mg by mouth daily.      . methocarbamol (ROBAXIN) 500 MG tablet If needed for back pain      . metoprolol (TOPROL-XL) 200 MG 24 hr tablet Take 200 mg by mouth daily.        . potassium chloride SA (K-DUR,KLOR-CON) 20 MEQ tablet Take 1 tablet (20 mEq total) by mouth daily.  30 tablet  11  . telmisartan-hydrochlorothiazide (MICARDIS HCT) 80-12.5 MG per tablet Take 1 tablet by mouth daily.        [name] warfarin (COUMADIN) 2.5 MG tablet Take as directed by anticoagulation clinic  90 tablet  3  . hydrochlorothiazide (MICROZIDE) 12.5 MG capsule Take 1 capsule (12.5 mg total) by mouth daily.  30 capsule  11   No current facility-administered medications for this visit.     Past Medical History  Diagnosis Date  . Osteoarthritis   . Mixed hyperlipidemia   . Coronary atherosclerosis of native coronary artery     Multivessel status post CABG, DES PLA March 2006  . Essential hypertension, benign   . Heart block   . Syncope   . Ventricular  tachycardia     Status post ICD  . Ischemic cardiomyopathy     LVEF 55% as of March 2014  . Atrial fibrillation     ROS:   All systems reviewed and negative except as noted in the HPI.   Past Surgical History  Procedure Laterality Date  . Coronary artery bypass graft      LIMA to LAD, SVG to diagonal, SVG to ramus and OM  . Cardiac defibrillator placement      Guidant device     Family History  Problem Relation Age of Onset  . Heart attack Father   . Heart attack Brother      History   Social History  . Marital Status: Widowed    Spouse Name: N/A    Number of Children: 2  . Years of Education: N/A   Occupational History  . Retired     Designer, jewellery  .     Social History Main Topics  . Smoking status: Never Smoker   . Smokeless tobacco: Never Used  . Alcohol Use: No  . Drug Use: No  . Sexually Active: Not  on file   Other Topics Concern  . Not on file   Social History Narrative  . No narrative on file     BP 132/62  Pulse 67  Ht 5\' 1"  (1.549 m)  Wt 176 lb 12.8 oz (80.196 kg)  BMI 33.42 kg/m2  SpO2 95%  Physical Exam:  Well appearing 79 yo woman, NAD HEENT: Unremarkable Neck:  7 cm JVD, no thyromegally Lungs:  Clear with no wheezes, rales, or rhonchi HEART:  Regular rate rhythm, no murmurs, no rubs, no clicks Abd:  soft, positive bowel sounds, no organomegally, no rebound, no guarding Ext:  2 plus pulses, no edema, no cyanosis, no clubbing Skin:  No rashes no nodules Neuro:  CN II through XII intact, motor grossly intact   DEVICE  Normal device function.  See PaceArt for details.   Assess/Plan:

## 2012-10-21 NOTE — Assessment & Plan Note (Signed)
She is maintaining NSR 99% of the time. No change in medical therapy.

## 2012-11-09 ENCOUNTER — Ambulatory Visit (INDEPENDENT_AMBULATORY_CARE_PROVIDER_SITE_OTHER): Payer: Medicare PPO | Admitting: *Deleted

## 2012-11-09 DIAGNOSIS — I2789 Other specified pulmonary heart diseases: Secondary | ICD-10-CM

## 2012-11-09 DIAGNOSIS — I509 Heart failure, unspecified: Secondary | ICD-10-CM

## 2012-11-09 DIAGNOSIS — I4891 Unspecified atrial fibrillation: Secondary | ICD-10-CM

## 2012-11-09 DIAGNOSIS — Z7901 Long term (current) use of anticoagulants: Secondary | ICD-10-CM

## 2012-11-09 LAB — POCT INR: INR: 2.2

## 2012-11-16 ENCOUNTER — Telehealth: Payer: Self-pay | Admitting: *Deleted

## 2012-11-16 NOTE — Telephone Encounter (Signed)
Pt called.  Had cortisone injection in foot for plantar fasciitis.  Informed pt this should not affect INR considerably.

## 2012-11-16 NOTE — Telephone Encounter (Signed)
Patient is wanting to make sure cordazone shots will not effect her coumadin

## 2012-11-21 ENCOUNTER — Encounter: Payer: Self-pay | Admitting: Internal Medicine

## 2012-11-21 ENCOUNTER — Ambulatory Visit (INDEPENDENT_AMBULATORY_CARE_PROVIDER_SITE_OTHER): Payer: Medicare PPO | Admitting: *Deleted

## 2012-11-21 DIAGNOSIS — I472 Ventricular tachycardia: Secondary | ICD-10-CM

## 2012-11-21 DIAGNOSIS — I4729 Other ventricular tachycardia: Secondary | ICD-10-CM

## 2012-11-25 LAB — REMOTE ICD DEVICE
AL AMPLITUDE: 2.2 mv
AL IMPEDENCE ICD: 568 Ohm
ATRIAL PACING ICD: 74.21 pct
BRDY-0003RA: 130 {beats}/min
RV LEAD IMPEDENCE ICD: 384 Ohm
TOT-0001: 1
TOT-0006: 20090511000000
TZAT-0001ATACH: 2
TZAT-0001ATACH: 3
TZAT-0002ATACH: NEGATIVE
TZAT-0005SLOWVT: 88 pct
TZAT-0011SLOWVT: 10 ms
TZAT-0012ATACH: 150 ms
TZAT-0012ATACH: 150 ms
TZAT-0013SLOWVT: 3
TZAT-0018ATACH: NEGATIVE
TZAT-0018ATACH: NEGATIVE
TZAT-0018SLOWVT: NEGATIVE
TZAT-0019ATACH: 6 V
TZAT-0019FASTVT: 8 V
TZAT-0020ATACH: 1.5 ms
TZAT-0020ATACH: 1.5 ms
TZAT-0020FASTVT: 1.5 ms
TZON-0003ATACH: 350 ms
TZON-0003VSLOWVT: 450 ms
TZON-0004SLOWVT: 20
TZON-0004VSLOWVT: 24
TZON-0005SLOWVT: 12
TZST-0001ATACH: 6
TZST-0001FASTVT: 3
TZST-0001SLOWVT: 3
TZST-0001SLOWVT: 5
TZST-0002ATACH: NEGATIVE
TZST-0002ATACH: NEGATIVE
TZST-0002FASTVT: NEGATIVE
TZST-0002FASTVT: NEGATIVE
TZST-0003SLOWVT: 35 J
VF: 0

## 2012-11-29 ENCOUNTER — Telehealth: Payer: Self-pay | Admitting: *Deleted

## 2012-11-29 NOTE — Telephone Encounter (Signed)
Pt checking on battery status from [facility] transmission on 11/21/12. Battery still at 2.62V, ERI = 2.61V. Next transmission 12/26/12 for battery check only. Pt understands that if alert tone heard due to ERI to call clinic.  Pt understands that if tone occurs on a wknd it is okay to wait till Mon to contact clinic.   I also reassured her that once ERI is reached, there is still some reserve battery in her device; ERI is not an emergency [name] [name]

## 2012-12-02 ENCOUNTER — Encounter: Payer: Medicare PPO | Admitting: Internal Medicine

## 2012-12-07 ENCOUNTER — Ambulatory Visit (INDEPENDENT_AMBULATORY_CARE_PROVIDER_SITE_OTHER): Payer: Medicare PPO | Admitting: *Deleted

## 2012-12-07 DIAGNOSIS — I4891 Unspecified atrial fibrillation: Secondary | ICD-10-CM

## 2012-12-07 DIAGNOSIS — Z7901 Long term (current) use of anticoagulants: Secondary | ICD-10-CM

## 2012-12-07 DIAGNOSIS — I509 Heart failure, unspecified: Secondary | ICD-10-CM

## 2012-12-07 DIAGNOSIS — I2789 Other specified pulmonary heart diseases: Secondary | ICD-10-CM

## 2012-12-09 ENCOUNTER — Encounter: Payer: Self-pay | Admitting: *Deleted

## 2012-12-12 NOTE — Addendum Note (Signed)
Addended by: Gypsy Balsam K on: 12/12/2012 12:28 PM   Modules accepted: Level of Service

## 2012-12-14 ENCOUNTER — Encounter (HOSPITAL_COMMUNITY): Payer: Self-pay | Admitting: Pharmacy Technician

## 2012-12-21 ENCOUNTER — Other Ambulatory Visit: Payer: Self-pay | Admitting: Obstetrics & Gynecology

## 2012-12-21 DIAGNOSIS — Z139 Encounter for screening, unspecified: Secondary | ICD-10-CM

## 2012-12-26 ENCOUNTER — Ambulatory Visit (INDEPENDENT_AMBULATORY_CARE_PROVIDER_SITE_OTHER): Payer: Medicare PPO | Admitting: *Deleted

## 2012-12-26 ENCOUNTER — Ambulatory Visit (HOSPITAL_COMMUNITY)
Admission: RE | Admit: 2012-12-26 | Discharge: 2012-12-26 | Disposition: A | Discharge status: Home / Self Care | Payer: Medicare PPO | Source: Ambulatory Visit | Attending: Obstetrics & Gynecology | Admitting: Obstetrics & Gynecology

## 2012-12-26 DIAGNOSIS — I472 Ventricular tachycardia, unspecified: Secondary | ICD-10-CM

## 2012-12-26 DIAGNOSIS — Z1231 Encounter for screening mammogram for malignant neoplasm of breast: Secondary | ICD-10-CM | POA: Insufficient documentation

## 2012-12-26 DIAGNOSIS — Z139 Encounter for screening, unspecified: Secondary | ICD-10-CM

## 2012-12-27 ENCOUNTER — Encounter (HOSPITAL_COMMUNITY)
Admission: RE | Disposition: A | Discharge status: Home / Self Care | Payer: Self-pay | Source: Ambulatory Visit | Attending: Ophthalmology

## 2012-12-27 ENCOUNTER — Ambulatory Visit (HOSPITAL_COMMUNITY)
Admission: RE | Admit: 2012-12-27 | Discharge: 2012-12-27 | Disposition: A | Discharge status: Home / Self Care | Payer: Medicare PPO | Source: Ambulatory Visit | Attending: Ophthalmology | Admitting: Ophthalmology

## 2012-12-27 ENCOUNTER — Encounter (HOSPITAL_COMMUNITY): Payer: Self-pay | Admitting: *Deleted

## 2012-12-27 DIAGNOSIS — Z1231 Encounter for screening mammogram for malignant neoplasm of breast: Secondary | ICD-10-CM | POA: Insufficient documentation

## 2012-12-27 DIAGNOSIS — H26499 Other secondary cataract, unspecified eye: Secondary | ICD-10-CM | POA: Insufficient documentation

## 2012-12-27 DIAGNOSIS — I10 Essential (primary) hypertension: Secondary | ICD-10-CM | POA: Insufficient documentation

## 2012-12-27 HISTORY — PX: YAG LASER APPLICATION: SHX6189

## 2012-12-27 SURGERY — TREATMENT, USING YAG LASER
Anesthesia: LOCAL | Laterality: Left

## 2012-12-27 MED ORDER — TROPICAMIDE 1 % OP SOLN
1.0000 [drp] | OPHTHALMIC | Status: AC
  Administered 2012-12-27 (×2): 1 [drp] via OPHTHALMIC

## 2012-12-27 MED ORDER — TROPICAMIDE 1 % OP SOLN
OPHTHALMIC | Status: AC
  Filled 2012-12-27: qty 3

## 2012-12-27 NOTE — H&P (Signed)
The patient was re examined and there is no change in the patients condition since the original H and P. 

## 2012-12-27 NOTE — Brief Op Note (Addendum)
[name] Bejar T. Nile Riggs, MD  Procedure: Yag Capsulotomy  Yag Laser Self Test Completedyes. Procedure: Posterior Capsulotomy, Eye Protection Worn by Staff yes. Laser In Use Sign on Door yes.  Laser: Nd:YAG Spot Size: Fixed Burst Mode: III Power Setting: 3.8 mJ/burst Number of shots: 16 Total energy delivered: 59.1 mJ   The patient tolerated the procedure without difficulty. No complications were encountered.   The patient was discharged home with the instructions to continue all her current glaucoma medications, if any.   Patient instructed to go to office at 0100 for intraocular pressure check.  Patient verbalizes understanding of discharge instructions yes.    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

## 2012-12-28 ENCOUNTER — Encounter (HOSPITAL_COMMUNITY): Payer: Self-pay | Admitting: Pharmacy Technician

## 2012-12-29 ENCOUNTER — Encounter (HOSPITAL_COMMUNITY): Payer: Self-pay | Admitting: Ophthalmology

## 2013-01-02 LAB — REMOTE ICD DEVICE
ATRIAL PACING ICD: 74.27 pct
BRDY-0004RA: 130 {beats}/min
DEV-0020ICD: NEGATIVE
FVT: 0
PACEART VT: 0
TOT-0001: 1
TZAT-0001ATACH: 2
TZAT-0001ATACH: 3
TZAT-0001SLOWVT: 1
TZAT-0002FASTVT: NEGATIVE
TZAT-0004SLOWVT: 8
TZAT-0005SLOWVT: 88 pct
TZAT-0011SLOWVT: 10 ms
TZAT-0012ATACH: 150 ms
TZAT-0012ATACH: 150 ms
TZAT-0012SLOWVT: 200 ms
TZAT-0013SLOWVT: 3
TZAT-0018ATACH: NEGATIVE
TZAT-0018FASTVT: NEGATIVE
TZAT-0018SLOWVT: NEGATIVE
TZAT-0019ATACH: 6 V
TZAT-0019FASTVT: 8 V
TZAT-0020ATACH: 1.5 ms
TZAT-0020ATACH: 1.5 ms
TZON-0003SLOWVT: 350 ms
TZON-0003VSLOWVT: 450 ms
TZON-0004SLOWVT: 20
TZON-0004VSLOWVT: 24
TZON-0005SLOWVT: 12
TZST-0001ATACH: 6
TZST-0001FASTVT: 2
TZST-0001FASTVT: 3
TZST-0001FASTVT: 6
TZST-0001SLOWVT: 2
TZST-0001SLOWVT: 3
TZST-0001SLOWVT: 5
TZST-0002ATACH: NEGATIVE
TZST-0002ATACH: NEGATIVE
TZST-0002FASTVT: NEGATIVE
TZST-0002FASTVT: NEGATIVE
TZST-0002FASTVT: NEGATIVE
TZST-0003SLOWVT: 35 J
TZST-0003SLOWVT: 35 J
VENTRICULAR PACING ICD: 99.93 pct

## 2013-01-04 ENCOUNTER — Ambulatory Visit (INDEPENDENT_AMBULATORY_CARE_PROVIDER_SITE_OTHER): Payer: Medicare PPO | Admitting: *Deleted

## 2013-01-04 DIAGNOSIS — I4891 Unspecified atrial fibrillation: Secondary | ICD-10-CM

## 2013-01-04 DIAGNOSIS — I509 Heart failure, unspecified: Secondary | ICD-10-CM

## 2013-01-04 DIAGNOSIS — I2789 Other specified pulmonary heart diseases: Secondary | ICD-10-CM

## 2013-01-04 DIAGNOSIS — Z7901 Long term (current) use of anticoagulants: Secondary | ICD-10-CM

## 2013-01-09 MED ORDER — TROPICAMIDE 1 % OP SOLN
OPHTHALMIC | Status: AC
  Filled 2013-01-09: qty 3

## 2013-01-10 ENCOUNTER — Ambulatory Visit (HOSPITAL_COMMUNITY)
Admission: RE | Admit: 2013-01-10 | Discharge: 2013-01-10 | Disposition: A | Discharge status: Home / Self Care | Payer: Medicare PPO | Source: Ambulatory Visit | Attending: Ophthalmology | Admitting: Ophthalmology

## 2013-01-10 ENCOUNTER — Encounter: Payer: Self-pay | Admitting: *Deleted

## 2013-01-10 ENCOUNTER — Encounter (HOSPITAL_COMMUNITY): Payer: Self-pay | Admitting: *Deleted

## 2013-01-10 ENCOUNTER — Encounter (HOSPITAL_COMMUNITY)
Admission: RE | Disposition: A | Discharge status: Home / Self Care | Payer: Self-pay | Source: Ambulatory Visit | Attending: Ophthalmology

## 2013-01-10 DIAGNOSIS — H26499 Other secondary cataract, unspecified eye: Secondary | ICD-10-CM | POA: Insufficient documentation

## 2013-01-10 HISTORY — PX: YAG LASER APPLICATION: SHX6189

## 2013-01-10 SURGERY — TREATMENT, USING YAG LASER
Anesthesia: LOCAL | Laterality: Right

## 2013-01-10 MED ORDER — TROPICAMIDE 1 % OP SOLN
1.0000 [drp] | OPHTHALMIC | Status: AC
  Administered 2013-01-10 (×2): 1 [drp] via OPHTHALMIC

## 2013-01-10 NOTE — Brief Op Note (Addendum)
Devereaux Grayson T. Nile Riggs, MD  Procedure: Yag Capsulotomy  Yag Laser Self Test Completedyes. Procedure: Posterior Capsulotomy, Eye Protection Worn by Staff yes. Laser In Use Sign on Door yes.  Laser: Nd:YAG Spot Size: Fixed Burst Mode: III Power Setting: 3.1 mJ/burst Number of shots: 22 Total energy delivered: 67.7 mJ   The patient tolerated the procedure without difficulty. No complications were encountered.   The patient was discharged home with the instructions to continue all her current glaucoma medications, if any.   Patient instructed to go to office at 0100 for intraocular pressure check.  Patient verbalizes understanding of discharge instructions yes.    Pre-Operative Diagnosis: Posterior Capsule Fibrosis, 366.53 OD Post-Operative Diagnosis: Posterior Capsule Fibrosis, 366.53 OD

## 2013-01-10 NOTE — Progress Notes (Signed)
Ok with dr Nile Riggs that only gave 2 drops. Patient discharged with instruction to see dr Nile Riggs today between 1:00-2:00 at his office

## 2013-01-10 NOTE — H&P (Signed)
The patient was re examined and there is no change in the patients condition since the original H and P. 

## 2013-01-11 ENCOUNTER — Encounter: Payer: Self-pay | Admitting: *Deleted

## 2013-01-11 ENCOUNTER — Other Ambulatory Visit: Payer: Self-pay | Admitting: *Deleted

## 2013-01-11 ENCOUNTER — Encounter (HOSPITAL_COMMUNITY): Payer: Self-pay | Admitting: Ophthalmology

## 2013-01-11 DIAGNOSIS — I4891 Unspecified atrial fibrillation: Secondary | ICD-10-CM

## 2013-01-11 DIAGNOSIS — I10 Essential (primary) hypertension: Secondary | ICD-10-CM

## 2013-01-11 DIAGNOSIS — E782 Mixed hyperlipidemia: Secondary | ICD-10-CM

## 2013-01-17 ENCOUNTER — Telehealth: Payer: Self-pay | Admitting: *Deleted

## 2013-01-17 NOTE — Telephone Encounter (Signed)
Started on Keflex 500mg  tid last night.  Will take for 10 days.  Continue same dose of coumadin. Eat extra greens.  INR appt moved up to 01/26/13.  Pt in agreement.

## 2013-01-24 ENCOUNTER — Encounter: Payer: Self-pay | Admitting: Internal Medicine

## 2013-01-24 ENCOUNTER — Encounter: Payer: Self-pay | Admitting: *Deleted

## 2013-01-24 LAB — LIPID PANEL
Cholesterol: 111 mg/dL (ref 0–200)
HDL: 31 mg/dL — ABNORMAL LOW (ref >39)

## 2013-01-24 LAB — BASIC METABOLIC PANEL
BUN: 22 mg/dL (ref 6–23)
CO2: 27 mEq/L (ref 19–32)
Chloride: 102 mEq/L (ref 96–112)
Creat: 1.01 mg/dL (ref 0.50–1.10)
Glucose, Bld: 107 mg/dL — ABNORMAL HIGH (ref 70–99)
Potassium: 3.9 mEq/L (ref 3.5–5.3)

## 2013-01-24 LAB — HEPATIC FUNCTION PANEL
ALT: 50 U/L — ABNORMAL HIGH (ref 0–35)
AST: 34 U/L (ref 0–37)
Albumin: 4 g/dL (ref 3.5–5.2)
Bilirubin, Direct: 0.2 mg/dL (ref 0.0–0.3)
Total Protein: 6.2 g/dL (ref 6.0–8.3)

## 2013-01-24 LAB — CBC
MCV: 88.4 fL (ref 78.0–100.0)
Platelets: 235 10*3/uL (ref 150–400)
RDW: 14 % (ref 11.5–15.5)
WBC: 8.1 10*3/uL (ref 4.0–10.5)

## 2013-01-25 ENCOUNTER — Encounter: Payer: Self-pay | Admitting: *Deleted

## 2013-01-25 MED ORDER — AMLODIPINE-ATORVASTATIN 5-20 MG PO TABS: 1.0000 | ORAL_TABLET | Freq: Every day | ORAL | Status: DC

## 2013-01-25 NOTE — Addendum Note (Signed)
Addended by: Derry Lory A on: 01/25/2013 04:17 PM   Modules accepted: Orders

## 2013-01-26 ENCOUNTER — Ambulatory Visit (INDEPENDENT_AMBULATORY_CARE_PROVIDER_SITE_OTHER): Payer: Medicare PPO | Admitting: *Deleted

## 2013-01-26 DIAGNOSIS — Z7901 Long term (current) use of anticoagulants: Secondary | ICD-10-CM

## 2013-01-26 DIAGNOSIS — I4891 Unspecified atrial fibrillation: Secondary | ICD-10-CM

## 2013-01-26 DIAGNOSIS — I2789 Other specified pulmonary heart diseases: Secondary | ICD-10-CM

## 2013-01-26 DIAGNOSIS — I509 Heart failure, unspecified: Secondary | ICD-10-CM

## 2013-01-30 ENCOUNTER — Ambulatory Visit (INDEPENDENT_AMBULATORY_CARE_PROVIDER_SITE_OTHER): Payer: Medicare PPO | Admitting: *Deleted

## 2013-01-30 ENCOUNTER — Encounter: Payer: Self-pay | Admitting: Internal Medicine

## 2013-01-30 DIAGNOSIS — I472 Ventricular tachycardia: Secondary | ICD-10-CM

## 2013-01-30 DIAGNOSIS — Z95810 Presence of automatic (implantable) cardiac defibrillator: Secondary | ICD-10-CM

## 2013-02-09 ENCOUNTER — Telehealth: Payer: Self-pay | Admitting: Internal Medicine

## 2013-02-09 NOTE — Telephone Encounter (Signed)
New Problem    Pt would like a call back she has some questions about her device.    Thanks!

## 2013-02-10 NOTE — Telephone Encounter (Signed)
Battery at 2.62V as of 01/30/13, ERI is 2.61V. Pt concerned when ERI is reached she will have a very small window to get a new ICD.  I reassured her there's a "reserve tank" of battery power once she hears the alert tone.    Pt understands she will see Nehemiah Settle once ERI is reached then Dr. Ladona Ridgel will give her a new device upon being cleared for surgery.   Next Remote tentative for mid November.

## 2013-02-14 LAB — REMOTE ICD DEVICE
AL AMPLITUDE: 3.1 mv
ATRIAL PACING ICD: 85.08 pct
BAMS-0001: 170 {beats}/min
BRDY-0002RA: 60 {beats}/min
BRDY-0003RA: 130 {beats}/min
BRDY-0004RA: 130 {beats}/min
DEV-0020ICD: NEGATIVE
FVT: 0
RV LEAD IMPEDENCE ICD: 384 Ohm
TOT-0006: 20090511000000
TZAT-0001ATACH: 1
TZAT-0001ATACH: 2
TZAT-0001ATACH: 3
TZAT-0001FASTVT: 1
TZAT-0001SLOWVT: 1
TZAT-0002ATACH: NEGATIVE
TZAT-0002FASTVT: NEGATIVE
TZAT-0004SLOWVT: 8
TZAT-0011SLOWVT: 10 ms
TZAT-0012ATACH: 150 ms
TZAT-0012ATACH: 150 ms
TZAT-0012ATACH: 150 ms
TZAT-0012SLOWVT: 200 ms
TZAT-0013SLOWVT: 3
TZAT-0018ATACH: NEGATIVE
TZAT-0018ATACH: NEGATIVE
TZAT-0018SLOWVT: NEGATIVE
TZAT-0019ATACH: 6 V
TZAT-0019FASTVT: 8 V
TZAT-0019SLOWVT: 8 V
TZAT-0020ATACH: 1.5 ms
TZAT-0020ATACH: 1.5 ms
TZON-0003ATACH: 350 ms
TZON-0003SLOWVT: 350 ms
TZON-0003VSLOWVT: 450 ms
TZON-0004SLOWVT: 20
TZON-0004VSLOWVT: 24
TZON-0005SLOWVT: 12
TZST-0001ATACH: 4
TZST-0001ATACH: 6
TZST-0001FASTVT: 2
TZST-0001FASTVT: 3
TZST-0001FASTVT: 4
TZST-0001FASTVT: 5
TZST-0001FASTVT: 6
TZST-0001SLOWVT: 2
TZST-0001SLOWVT: 3
TZST-0001SLOWVT: 5
TZST-0001SLOWVT: 6
TZST-0002ATACH: NEGATIVE
TZST-0002FASTVT: NEGATIVE
TZST-0002FASTVT: NEGATIVE
TZST-0003SLOWVT: 35 J
TZST-0003SLOWVT: 35 J
TZST-0003SLOWVT: 35 J
VENTRICULAR PACING ICD: 99.95 pct
VF: 0

## 2013-02-24 ENCOUNTER — Encounter: Payer: Self-pay | Admitting: *Deleted

## 2013-03-01 ENCOUNTER — Encounter: Payer: Self-pay | Admitting: Cardiology

## 2013-03-01 ENCOUNTER — Ambulatory Visit (INDEPENDENT_AMBULATORY_CARE_PROVIDER_SITE_OTHER): Payer: Medicare PPO | Admitting: Cardiology

## 2013-03-01 ENCOUNTER — Ambulatory Visit (INDEPENDENT_AMBULATORY_CARE_PROVIDER_SITE_OTHER): Payer: Medicare PPO | Admitting: *Deleted

## 2013-03-01 VITALS — BP 132/78 | HR 94 | Ht 61.0 in | Wt 178.0 lb

## 2013-03-01 DIAGNOSIS — Z7901 Long term (current) use of anticoagulants: Secondary | ICD-10-CM

## 2013-03-01 DIAGNOSIS — E7800 Pure hypercholesterolemia, unspecified: Secondary | ICD-10-CM

## 2013-03-01 DIAGNOSIS — I4891 Unspecified atrial fibrillation: Secondary | ICD-10-CM

## 2013-03-01 DIAGNOSIS — I350 Nonrheumatic aortic (valve) stenosis: Secondary | ICD-10-CM

## 2013-03-01 DIAGNOSIS — I2789 Other specified pulmonary heart diseases: Secondary | ICD-10-CM

## 2013-03-01 DIAGNOSIS — I10 Essential (primary) hypertension: Secondary | ICD-10-CM

## 2013-03-01 DIAGNOSIS — I2510 Atherosclerotic heart disease of native coronary artery without angina pectoris: Secondary | ICD-10-CM

## 2013-03-01 DIAGNOSIS — I359 Nonrheumatic aortic valve disorder, unspecified: Secondary | ICD-10-CM

## 2013-03-01 DIAGNOSIS — I509 Heart failure, unspecified: Secondary | ICD-10-CM

## 2013-03-01 DIAGNOSIS — E782 Mixed hyperlipidemia: Secondary | ICD-10-CM

## 2013-03-01 MED ORDER — WARFARIN SODIUM 2.5 MG PO TABS: ORAL_TABLET | ORAL | Status: DC

## 2013-03-01 NOTE — Patient Instructions (Addendum)
Your physician recommends that you schedule a follow-up appointment in: 6 months   Your physician recommends that you return for lab work in:   In 5 months we will send lab slips in mail

## 2013-03-01 NOTE — Assessment & Plan Note (Signed)
Mild by echocardiogram in March, mean gradient 11 mm mercury. Not symptomatic.

## 2013-03-01 NOTE — Assessment & Plan Note (Signed)
Blood pressure control is good today. No changes made. 

## 2013-03-01 NOTE — Progress Notes (Signed)
Clinical Summary Ms. Kunz is a medically complex 79 y.o.female last seen in July. She has had interval followup with Dr. Ladona Ridgel in the device clinic. Most recently noted to be in atrial fibrillation only 0.2% of the time. Device is nearing ERI, being followed closely.  Lab work from October reviewed finding AST 34, ALT 50, potassium 3.9, BUN 22, creatinine 1.0, hemoglobin 13.9, platelets 235, cholesterol 111, triglycerides 119, HDL 31, LDL 56. We discussed this today. I did reduce her Lipitor dose.  She has a history of ischemic cardiomyopathy with history of heart block and then subsequently VT status post ICD placement (followed by Dr. Ladona Ridgel), although with subsequent normalization of LV function. Echocardiogram from March of this year revealed mild LVH with LVEF 55-60%, grade 2 diastolic dysfunction, mild aortic stenosis with mean gradient 11 mm mercury, mild mitral regurgitation, mild left atrial enlargement, device wire in the right heart, mild right atrial enlargement, PASP approximately 50 mm mercury.  She reports no angina symptoms, does have regular palpitations. Chronic stable dyspnea or exertion.   Allergies  Allergen Reactions  . Codeine Nausea And Vomiting    Current Outpatient Prescriptions  Medication Sig Dispense Refill  . acetaminophen (TYLENOL) 500 MG tablet Take 500 mg by mouth every 6 (six) hours as needed for pain.      [name] amLODipine-atorvastatin (CADUET) 5-20 MG per tablet Take 1 tablet by mouth daily.  30 tablet  6  . aspirin 81 MG tablet Take 81 mg by mouth daily.        . Calcium Carbonate-Vitamin D (CALCIUM 600 + D PO) Take 1 tablet by mouth 2 (two) times daily.      . Cholecalciferol (VITAMIN D) 2000 UNITS CAPS Take 1 capsule by mouth daily.        . folic acid (FOLVITE) 1 MG tablet Take 1 mg by mouth 2 (two) times daily.       . hydrochlorothiazide (MICROZIDE) 12.5 MG capsule Take 1 capsule (12.5 mg total) by mouth daily.  30 capsule  11  . magnesium oxide  (MAG-OX) 400 MG tablet Take 400 mg by mouth daily.      . metoprolol (TOPROL-XL) 200 MG 24 hr tablet Take 200 mg by mouth daily.        Bertram Gala Glycol-Propyl Glycol (SYSTANE OP) Place 1 drop into both eyes daily as needed (Dry eyes).      . potassium chloride SA (K-DUR,KLOR-CON) 20 MEQ tablet Take 1 tablet (20 mEq total) by mouth daily.  30 tablet  11  . PRESCRIPTION MEDICATION Inject as directed. Steroid injection in foot given by Dr. Pricilla Holm      . telmisartan-hydrochlorothiazide (MICARDIS HCT) 80-12.5 MG per tablet Take 1 tablet by mouth daily.        [name] warfarin (COUMADIN) 2.5 MG tablet Takes 5 mg everyday except Wed and Sun when patient takes 2.5 mg  60 tablet  3   No current facility-administered medications for this visit.    Past Medical History  Diagnosis Date  . Osteoarthritis   . Mixed hyperlipidemia   . Coronary atherosclerosis of native coronary artery     Multivessel status post CABG, DES PLA March 2006  . Essential hypertension, benign   . Heart block   . Syncope   . Ventricular tachycardia     Status post ICD  . Ischemic cardiomyopathy     LVEF 55% as of March 2014  . Atrial fibrillation     Past Surgical History  Procedure Laterality Date  . Coronary artery bypass graft      LIMA to LAD, SVG to diagonal, SVG to ramus and OM  . Cardiac defibrillator placement      Guidant device  . Yag laser application Left 12/27/2012    Procedure: YAG LASER APPLICATION;  Surgeon: Loraine Leriche T. Nile Riggs, MD;  Location: [facility];  Service: Ophthalmology;  Laterality: Left;  . Yag laser application Right 01/10/2013    Procedure: YAG LASER APPLICATION;  Surgeon: Loraine Leriche T. Nile Riggs, MD;  Location: [facility];  Service: Ophthalmology;  Laterality: Right;    Social History Ms. Couse reports that she has never smoked. She has never used smokeless tobacco. Ms. Devera reports that she does not drink alcohol.  Review of Systems Negative except as outlined.  Physical Examination Filed Vitals:    03/01/13 0929  BP: 132/78  Pulse: 94   Filed Weights   03/01/13 0929  Weight: 178 lb (80.74 kg)    Comfortable at rest.  HEENT: Conjunctiva and lids normal, oropharynx clear.  Neck: Supple, no elevated JVP or carotid bruits, no thyromegaly.  Lungs: Clear to auscultation, nonlabored breathing at rest.  Cardiac: Regular rate and rhythm, no S3, soft systolic murmur, no pericardial rub.  Abdomen: Soft, nontender, bowel sounds present, resolving ecchymosis posterior lower back area - states she ran into a door.  Extremities: No pitting edema, distal pulses 2+.  Skin: Warm and dry.  Musculoskeletal: No kyphosis.  Neuropsychiatric: Alert and oriented x3, affect grossly appropriate.   Problem List and Plan   Coronary atherosclerosis of native coronary artery Multivessel disease status post CABG, remote DES to PLA in 2006. Patient is on Coumadin with concurrent history of PAF, I did discuss with her stopping aspirin, however she was more comfortable with continuing those. She is not reporting any angina.  Mixed hyperlipidemia Recent LDL 56, mild increase in ALT. We went ahead and reduced Lipitor dose as I suspect she will still have optimal control over time and less risk for side effects. Recheck FLP and LFT for next visit.  Essential hypertension, benign Blood pressure control is good today. No changes made.  Atrial fibrillation Paroxysmal, continues on Coumadin.  Aortic stenosis Mild by echocardiogram in March, mean gradient 11 mm mercury. Not symptomatic.    Jonelle Sidle, M.D., F.A.C.C.

## 2013-03-01 NOTE — Assessment & Plan Note (Signed)
Paroxysmal, continues on Coumadin.

## 2013-03-01 NOTE — Assessment & Plan Note (Addendum)
Recent LDL 56, mild increase in ALT. We went ahead and reduced Lipitor dose as I suspect she will still have optimal control over time and less risk for side effects. Recheck FLP and LFT for next visit.

## 2013-03-01 NOTE — Assessment & Plan Note (Signed)
Multivessel disease status post CABG, remote DES to PLA in 2006. Patient is on Coumadin with concurrent history of PAF, I did discuss with her stopping aspirin, however she was more comfortable with continuing those. She is not reporting any angina.

## 2013-03-06 ENCOUNTER — Ambulatory Visit (INDEPENDENT_AMBULATORY_CARE_PROVIDER_SITE_OTHER): Payer: Medicare PPO | Admitting: *Deleted

## 2013-03-06 ENCOUNTER — Telehealth: Payer: Self-pay

## 2013-03-06 DIAGNOSIS — E7800 Pure hypercholesterolemia, unspecified: Secondary | ICD-10-CM

## 2013-03-06 DIAGNOSIS — Z95810 Presence of automatic (implantable) cardiac defibrillator: Secondary | ICD-10-CM

## 2013-03-06 NOTE — Telephone Encounter (Signed)
Lab slips mailed to pt.

## 2013-03-13 LAB — MDC_IDC_ENUM_SESS_TYPE_REMOTE
Battery Voltage: 2.62 V
Brady Statistic AP VP Percent: 82.61 %
Brady Statistic AP VS Percent: 0.02 %
Brady Statistic AS VS Percent: 0.02 %
Brady Statistic RA Percent Paced: 82.63 %
Date Time Interrogation Session: 20141117153527
HighPow Impedance: 54 Ohm
HighPow Impedance: 74 Ohm
Lead Channel Impedance Value: 560 Ohm
Lead Channel Setting Pacing Amplitude: 2 V
Lead Channel Setting Sensing Sensitivity: 0.45 mV
Zone Setting Detection Interval: 350 ms
Zone Setting Detection Interval: 350 ms
Zone Setting Detection Interval: 450 ms

## 2013-03-20 ENCOUNTER — Ambulatory Visit: Payer: Medicare PPO | Admitting: Cardiology

## 2013-03-21 ENCOUNTER — Encounter: Payer: Self-pay | Admitting: *Deleted

## 2013-03-25 NOTE — Addendum Note (Signed)
Addended by: Glenda Chroman on: 03/25/2013 05:25 PM   Modules accepted: Level of Service

## 2013-04-05 ENCOUNTER — Ambulatory Visit (INDEPENDENT_AMBULATORY_CARE_PROVIDER_SITE_OTHER): Payer: Medicare PPO | Admitting: *Deleted

## 2013-04-05 DIAGNOSIS — I509 Heart failure, unspecified: Secondary | ICD-10-CM

## 2013-04-05 DIAGNOSIS — Z7901 Long term (current) use of anticoagulants: Secondary | ICD-10-CM

## 2013-04-05 DIAGNOSIS — I2789 Other specified pulmonary heart diseases: Secondary | ICD-10-CM

## 2013-04-05 DIAGNOSIS — I4891 Unspecified atrial fibrillation: Secondary | ICD-10-CM

## 2013-04-07 ENCOUNTER — Ambulatory Visit (INDEPENDENT_AMBULATORY_CARE_PROVIDER_SITE_OTHER): Payer: Medicare PPO | Admitting: *Deleted

## 2013-04-07 ENCOUNTER — Encounter: Payer: Self-pay | Admitting: Internal Medicine

## 2013-04-07 DIAGNOSIS — Z95810 Presence of automatic (implantable) cardiac defibrillator: Secondary | ICD-10-CM

## 2013-04-07 LAB — MDC_IDC_ENUM_SESS_TYPE_REMOTE
Battery Voltage: 2.62 V
Brady Statistic AP VP Percent: 83.52 %
Brady Statistic AS VP Percent: 16.43 %
Brady Statistic RA Percent Paced: 83.53 %
Brady Statistic RV Percent Paced: 99.95 %
Date Time Interrogation Session: 20141219150941
HighPow Impedance: 53 Ohm
Lead Channel Impedance Value: 528 Ohm
Lead Channel Setting Pacing Amplitude: 2.5 V
Lead Channel Setting Pacing Pulse Width: 0.5 ms
Lead Channel Setting Sensing Sensitivity: 0.45 mV
Zone Setting Detection Interval: 350 ms
Zone Setting Detection Interval: 450 ms

## 2013-04-11 ENCOUNTER — Inpatient Hospital Stay (HOSPITAL_COMMUNITY)
Admission: EM | Admit: 2013-04-11 | Discharge: 2013-04-14 | DRG: 293 | Disposition: A | Discharge status: Home Health | Payer: Medicare PPO | Attending: Pulmonary Disease | Admitting: Pulmonary Disease

## 2013-04-11 ENCOUNTER — Encounter (HOSPITAL_COMMUNITY): Payer: Self-pay | Admitting: Emergency Medicine

## 2013-04-11 ENCOUNTER — Emergency Department (HOSPITAL_COMMUNITY): Payer: Medicare PPO

## 2013-04-11 DIAGNOSIS — I472 Ventricular tachycardia: Secondary | ICD-10-CM

## 2013-04-11 DIAGNOSIS — I2589 Other forms of chronic ischemic heart disease: Secondary | ICD-10-CM | POA: Diagnosis present

## 2013-04-11 DIAGNOSIS — I2510 Atherosclerotic heart disease of native coronary artery without angina pectoris: Secondary | ICD-10-CM | POA: Diagnosis present

## 2013-04-11 DIAGNOSIS — I5023 Acute on chronic systolic (congestive) heart failure: Principal | ICD-10-CM | POA: Diagnosis present

## 2013-04-11 DIAGNOSIS — M1990 Unspecified osteoarthritis, unspecified site: Secondary | ICD-10-CM | POA: Diagnosis present

## 2013-04-11 DIAGNOSIS — Z8249 Family history of ischemic heart disease and other diseases of the circulatory system: Secondary | ICD-10-CM

## 2013-04-11 DIAGNOSIS — R633 Feeding difficulties: Secondary | ICD-10-CM

## 2013-04-11 DIAGNOSIS — Z7901 Long term (current) use of anticoagulants: Secondary | ICD-10-CM

## 2013-04-11 DIAGNOSIS — Z951 Presence of aortocoronary bypass graft: Secondary | ICD-10-CM

## 2013-04-11 DIAGNOSIS — I509 Heart failure, unspecified: Secondary | ICD-10-CM | POA: Diagnosis present

## 2013-04-11 DIAGNOSIS — Z7982 Long term (current) use of aspirin: Secondary | ICD-10-CM

## 2013-04-11 DIAGNOSIS — I359 Nonrheumatic aortic valve disorder, unspecified: Secondary | ICD-10-CM

## 2013-04-11 DIAGNOSIS — I5032 Chronic diastolic (congestive) heart failure: Secondary | ICD-10-CM | POA: Diagnosis present

## 2013-04-11 DIAGNOSIS — Z95810 Presence of automatic (implantable) cardiac defibrillator: Secondary | ICD-10-CM

## 2013-04-11 DIAGNOSIS — I4891 Unspecified atrial fibrillation: Secondary | ICD-10-CM | POA: Diagnosis present

## 2013-04-11 DIAGNOSIS — I350 Nonrheumatic aortic (valve) stenosis: Secondary | ICD-10-CM

## 2013-04-11 DIAGNOSIS — I5031 Acute diastolic (congestive) heart failure: Secondary | ICD-10-CM

## 2013-04-11 DIAGNOSIS — E782 Mixed hyperlipidemia: Secondary | ICD-10-CM | POA: Diagnosis present

## 2013-04-11 DIAGNOSIS — I10 Essential (primary) hypertension: Secondary | ICD-10-CM | POA: Diagnosis present

## 2013-04-11 DIAGNOSIS — E7800 Pure hypercholesterolemia, unspecified: Secondary | ICD-10-CM | POA: Diagnosis present

## 2013-04-11 DIAGNOSIS — Z79899 Other long term (current) drug therapy: Secondary | ICD-10-CM

## 2013-04-11 LAB — URINE MICROSCOPIC-ADD ON

## 2013-04-11 LAB — BASIC METABOLIC PANEL
BUN: 18 mg/dL (ref 6–23)
CO2: 26 mEq/L (ref 19–32)
Calcium: 9.4 mg/dL (ref 8.4–10.5)
Chloride: 98 mEq/L (ref 96–112)
Creatinine, Ser: 1.02 mg/dL (ref 0.50–1.10)
GFR calc Af Amer: 59 mL/min — ABNORMAL LOW (ref >90)

## 2013-04-11 LAB — URINALYSIS, ROUTINE W REFLEX MICROSCOPIC
Bilirubin Urine: NEGATIVE
Ketones, ur: NEGATIVE mg/dL
Specific Gravity, Urine: 1.02 (ref 1.005–1.030)
Urobilinogen, UA: 0.2 mg/dL (ref 0.0–1.0)
pH: 5.5 (ref 5.0–8.0)

## 2013-04-11 LAB — CBC WITH DIFFERENTIAL/PLATELET
Basophils Relative: 0 % (ref 0–1)
Eosinophils Relative: 1 % (ref 0–5)
HCT: 41 % (ref 36.0–46.0)
Hemoglobin: 14 g/dL (ref 12.0–15.0)
Lymphocytes Relative: 7 % — ABNORMAL LOW (ref 12–46)
MCH: 31.3 pg (ref 26.0–34.0)
MCHC: 34.1 g/dL (ref 30.0–36.0)
MCV: 91.5 fL (ref 78.0–100.0)
Monocytes Absolute: 1.2 10*3/uL — ABNORMAL HIGH (ref 0.1–1.0)
Monocytes Relative: 12 % (ref 3–12)
Neutro Abs: 7.7 10*3/uL (ref 1.7–7.7)
Platelets: 161 10*3/uL (ref 150–400)
RDW: 13.7 % (ref 11.5–15.5)

## 2013-04-11 LAB — TROPONIN I: Troponin I: <0.3 ng/mL (ref <0.30)

## 2013-04-11 MED ORDER — FUROSEMIDE 10 MG/ML IJ SOLN
60.0000 mg | Freq: Once | INTRAMUSCULAR | Status: AC
  Administered 2013-04-11: 60 mg via INTRAVENOUS
  Filled 2013-04-11: qty 6

## 2013-04-11 MED ORDER — IPRATROPIUM BROMIDE 0.02 % IN SOLN
0.5000 mg | Freq: Once | RESPIRATORY_TRACT | Status: AC
  Administered 2013-04-11: 0.5 mg via RESPIRATORY_TRACT
  Filled 2013-04-11: qty 2.5

## 2013-04-11 MED ORDER — SODIUM CHLORIDE 0.9 % IV SOLN: 250.0000 mL | INTRAVENOUS | Status: DC | PRN

## 2013-04-11 MED ORDER — SODIUM CHLORIDE 0.9 % IJ SOLN
3.0000 mL | Freq: Two times a day (BID) | INTRAMUSCULAR | Status: DC
  Administered 2013-04-12 – 2013-04-13 (×4): 3 mL via INTRAVENOUS

## 2013-04-11 MED ORDER — ATORVASTATIN CALCIUM 20 MG PO TABS
20.0000 mg | ORAL_TABLET | Freq: Every day | ORAL | Status: DC
  Administered 2013-04-12: 20 mg via ORAL
  Filled 2013-04-11: qty 1

## 2013-04-11 MED ORDER — AMLODIPINE BESYLATE 5 MG PO TABS
5.0000 mg | ORAL_TABLET | Freq: Every day | ORAL | Status: DC
  Administered 2013-04-12: 5 mg via ORAL
  Filled 2013-04-11: qty 1

## 2013-04-11 MED ORDER — METHYLPREDNISOLONE SODIUM SUCC 125 MG IJ SOLR
125.0000 mg | Freq: Once | INTRAMUSCULAR | Status: AC
  Administered 2013-04-11: 125 mg via INTRAVENOUS
  Filled 2013-04-11: qty 2

## 2013-04-11 MED ORDER — POTASSIUM CHLORIDE CRYS ER 20 MEQ PO TBCR: 20.0000 meq | EXTENDED_RELEASE_TABLET | Freq: Every day | ORAL | Status: DC

## 2013-04-11 MED ORDER — ALBUTEROL SULFATE (5 MG/ML) 0.5% IN NEBU
2.5000 mg | INHALATION_SOLUTION | RESPIRATORY_TRACT | Status: DC
  Administered 2013-04-12 – 2013-04-14 (×14): 2.5 mg via RESPIRATORY_TRACT
  Filled 2013-04-11 (×14): qty 0.5

## 2013-04-11 MED ORDER — FUROSEMIDE 10 MG/ML IJ SOLN
20.0000 mg | Freq: Two times a day (BID) | INTRAMUSCULAR | Status: DC
  Administered 2013-04-12 (×3): 20 mg via INTRAVENOUS
  Filled 2013-04-11 (×3): qty 2

## 2013-04-11 MED ORDER — SODIUM CHLORIDE 0.9 % IJ SOLN: 3.0000 mL | INTRAMUSCULAR | Status: DC | PRN

## 2013-04-11 MED ORDER — ASPIRIN 81 MG PO CHEW
81.0000 mg | CHEWABLE_TABLET | Freq: Every morning | ORAL | Status: DC
  Administered 2013-04-12 – 2013-04-14 (×3): 81 mg via ORAL
  Filled 2013-04-11 (×3): qty 1

## 2013-04-11 MED ORDER — ALBUTEROL SULFATE (5 MG/ML) 0.5% IN NEBU
2.5000 mg | INHALATION_SOLUTION | Freq: Once | RESPIRATORY_TRACT | Status: AC
  Administered 2013-04-11: 2.5 mg via RESPIRATORY_TRACT
  Filled 2013-04-11: qty 0.5

## 2013-04-11 MED ORDER — ALBUTEROL SULFATE (5 MG/ML) 0.5% IN NEBU: 5.0000 mg | INHALATION_SOLUTION | Freq: Once | RESPIRATORY_TRACT | Status: DC

## 2013-04-11 MED ORDER — IPRATROPIUM BROMIDE 0.02 % IN SOLN
0.5000 mg | RESPIRATORY_TRACT | Status: DC
  Administered 2013-04-12 – 2013-04-14 (×14): 0.5 mg via RESPIRATORY_TRACT
  Filled 2013-04-11 (×14): qty 2.5

## 2013-04-11 MED ORDER — SODIUM CHLORIDE 0.9 % IV SOLN
INTRAVENOUS | Status: DC
  Administered 2013-04-11: 20:00:00 via INTRAVENOUS

## 2013-04-11 MED ORDER — FUROSEMIDE 40 MG PO TABS
60.0000 mg | ORAL_TABLET | Freq: Once | ORAL | Status: DC
  Filled 2013-04-11: qty 2

## 2013-04-11 MED ORDER — METOPROLOL SUCCINATE ER 50 MG PO TB24
200.0000 mg | ORAL_TABLET | Freq: Every morning | ORAL | Status: DC
  Administered 2013-04-12 – 2013-04-14 (×3): 200 mg via ORAL
  Filled 2013-04-11 (×3): qty 4

## 2013-04-11 MED ORDER — AMLODIPINE-ATORVASTATIN 5-20 MG PO TABS: 1.0000 | ORAL_TABLET | Freq: Every day | ORAL | Status: DC

## 2013-04-11 MED ORDER — LOSARTAN POTASSIUM 50 MG PO TABS
100.0000 mg | ORAL_TABLET | Freq: Every day | ORAL | Status: DC
  Administered 2013-04-12 – 2013-04-14 (×3): 100 mg via ORAL
  Filled 2013-04-11 (×3): qty 2

## 2013-04-11 NOTE — H&P (Signed)
PCP:   Fredirick Maudlin, MD   Chief Complaint:  Shortness of breath  HPI:  79 year old female who  has a past medical history of Osteoarthritis; Mixed hyperlipidemia; Coronary atherosclerosis of native coronary artery; Essential hypertension, benign; Heart block; Syncope; Ventricular tachycardia; Ischemic cardiomyopathy; and Atrial fibrillation. Today presented to the ED with worsening shortness of breath for past 2 days. She denies chest pain and says that shortness of breath is worse on walking. She also complains of orthopnea. She denies any fever she has cough but no expectoration. No nausea vomiting or diarrhea. Patient is status post defibrillator, CABG. Patient is also on Coumadin for atrial fibrillation. In the ED, patient was found to have elevated BNP of 4599, which is markedly elevated as compared to previous BNP from May 2012 at that time BNP was 328.0 Allergies:   Allergies  Allergen Reactions  . Codeine Nausea And Vomiting      Past Medical History  Diagnosis Date  . Osteoarthritis   . Mixed hyperlipidemia   . Coronary atherosclerosis of native coronary artery     Multivessel status post CABG, DES PLA March 2006  . Essential hypertension, benign   . Heart block   . Syncope   . Ventricular tachycardia     Status post ICD  . Ischemic cardiomyopathy     LVEF 55% as of March 2014  . Atrial fibrillation     Past Surgical History  Procedure Laterality Date  . Coronary artery bypass graft      LIMA to LAD, SVG to diagonal, SVG to ramus and OM  . Cardiac defibrillator placement      Guidant device  . Yag laser application Left 12/27/2012    Procedure: YAG LASER APPLICATION;  Surgeon: Loraine Leriche T. Nile Riggs, MD;  Location: [facility];  Service: Ophthalmology;  Laterality: Left;  . Yag laser application Right 01/10/2013    Procedure: YAG LASER APPLICATION;  Surgeon: Loraine Leriche T. Nile Riggs, MD;  Location: [facility];  Service: Ophthalmology;  Laterality: Right;    Prior to Admission  medications   Medication Sig Start Date End Date Taking? Authorizing Provider  acetaminophen (TYLENOL) 500 MG tablet Take 500 mg by mouth every 6 (six) hours as needed for pain.   Yes Historical Provider, MD  amLODipine-atorvastatin (CADUET) 5-20 MG per tablet Take 1 tablet by mouth at bedtime.   Yes Historical Provider, MD  aspirin 81 MG tablet Take 81 mg by mouth every morning.    Yes Historical Provider, MD  Calcium Carbonate-Vitamin D (CALCIUM 600 + D PO) Take 1 tablet by mouth 2 (two) times daily.   Yes Historical Provider, MD  Cholecalciferol (VITAMIN D) 2000 UNITS CAPS Take 1 capsule by mouth at bedtime.    Yes Historical Provider, MD  folic acid (FOLVITE) 1 MG tablet Take 1 mg by mouth 2 (two) times daily.    Yes Historical Provider, MD  hydrochlorothiazide (MICROZIDE) 12.5 MG capsule Take 12.5 mg by mouth every morning.    Yes Historical Provider, MD  losartan-hydrochlorothiazide (HYZAAR) 100-12.5 MG per tablet Take 1 tablet by mouth every morning.  03/29/13  Yes Historical Provider, MD  magnesium oxide (MAG-OX) 400 MG tablet Take 400 mg by mouth every morning.    Yes Historical Provider, MD  metoprolol (TOPROL-XL) 200 MG 24 hr tablet Take 200 mg by mouth every morning.    Yes Historical Provider, MD  Polyethyl Glycol-Propyl Glycol (SYSTANE OP) Place 1 drop into both eyes daily as needed (Dry eyes).   Yes Historical Provider,  MD  potassium chloride SA (K-DUR,KLOR-CON) 20 MEQ tablet Take 1 tablet (20 mEq total) by mouth daily. 09/15/12 09/15/13 Yes Jonelle Sidle, MD  warfarin (COUMADIN) 2.5 MG tablet Take 2.5-5 mg by mouth See admin instructions. *Takes two tablets everyday except takes one tablet on Wednesdays and Sundays*   Yes Historical Provider, MD    Social History:  reports that she has never smoked. She has never used smokeless tobacco. She reports that she does not drink alcohol or use illicit drugs.  Family History  Problem Relation Age of Onset  . Heart attack Father   .  Heart attack Brother      All the positives are listed in BOLD  Review of Systems:  HEENT: Headache, blurred vision, runny nose, sore throat Neck: Hypothyroidism, hyperthyroidism,,lymphadenopathy Chest : Shortness of breath, history of COPD, Asthma Heart : Chest pain, history of coronary arterey disease GI:  Nausea, vomiting, diarrhea, constipation, GERD GU: Dysuria, urgency, frequency of urination, hematuria Neuro: Stroke, seizures, syncope Psych: Depression, anxiety, hallucinations   Physical Exam: Blood pressure 151/57, pulse 65, temperature 99.3 F (37.4 C), temperature source Oral, resp. rate 16, height 5\' 1"  (1.549 m), weight 77.1 kg (169 lb 15.6 oz), SpO2 97.00%. Constitutional:   Patient is a well-developed and well-nourished *female in no acute distress and cooperative with exam. Head: Normocephalic and atraumatic Mouth: Mucus membranes moist Eyes: PERRL, EOMI, conjunctivae normal Neck: Supple, No Thyromegaly Cardiovascular: RRR, S1 normal, S2 normal Pulmonary/Chest: Bilateral crackles at lung bases Abdominal: Soft. Non-tender, non-distended, bowel sounds are normal, no masses, organomegaly, or guarding present.  Neurological: A&O x3, Strenght is normal and symmetric bilaterally, cranial nerve II-XII are grossly intact, no focal motor deficit, sensory intact to light touch bilaterally.  Extremities : Trace edema of the lower extremities   Labs on Admission:  Results for orders placed during the hospital encounter of 04/11/13 (from the past 48 hour(s))  PRO B NATRIURETIC PEPTIDE     Status: Abnormal   Collection Time    04/11/13  7:40 PM      Result Value Range   Pro B Natriuretic peptide (BNP) 4599.0 (*) 0 - 450 pg/mL  TROPONIN I     Status: None   Collection Time    04/11/13  7:40 PM      Result Value Range   Troponin I <0.30  <0.30 ng/mL   Comment:            Due to the release kinetics of cTnI,     a negative result within the first hours     of the onset of  symptoms does not rule out     myocardial infarction with certainty.     If myocardial infarction is still suspected,     repeat the test at appropriate intervals.  CBC WITH DIFFERENTIAL     Status: Abnormal   Collection Time    04/11/13  7:40 PM      Result Value Range   WBC 9.7  4.0 - 10.5 K/uL   RBC 4.48  3.87 - 5.11 MIL/uL   Hemoglobin 14.0  12.0 - 15.0 g/dL   HCT 08.6  57.8 - 46.9 %   MCV 91.5  78.0 - 100.0 fL   MCH 31.3  26.0 - 34.0 pg   MCHC 34.1  30.0 - 36.0 g/dL   RDW 62.9  52.8 - 41.3 %   Platelets 161  150 - 400 K/uL   Neutrophils Relative % 80 (*) 43 - 77 %  Neutro Abs 7.7  1.7 - 7.7 K/uL   Lymphocytes Relative 7 (*) 12 - 46 %   Lymphs Abs 0.7  0.7 - 4.0 K/uL   Monocytes Relative 12  3 - 12 %   Monocytes Absolute 1.2 (*) 0.1 - 1.0 K/uL   Eosinophils Relative 1  0 - 5 %   Eosinophils Absolute 0.1  0.0 - 0.7 K/uL   Basophils Relative 0  0 - 1 %   Basophils Absolute 0.0  0.0 - 0.1 K/uL  BASIC METABOLIC PANEL     Status: Abnormal   Collection Time    04/11/13  7:40 PM      Result Value Range   Sodium 139  135 - 145 mEq/L   Potassium 3.5  3.5 - 5.1 mEq/L   Chloride 98  96 - 112 mEq/L   CO2 26  19 - 32 mEq/L   Glucose, Bld 160 (*) 70 - 99 mg/dL   BUN 18  6 - 23 mg/dL   Creatinine, Ser 1.61  0.50 - 1.10 mg/dL   Calcium 9.4  8.4 - 09.6 mg/dL   GFR calc non Af Amer 51 (*) >90 mL/min   GFR calc Af Amer 59 (*) >90 mL/min   Comment: (NOTE)     The eGFR has been calculated using the CKD EPI equation.     This calculation has not been validated in all clinical situations.     eGFR's persistently <90 mL/min signify possible Chronic Kidney     Disease.  D-DIMER, QUANTITATIVE     Status: None   Collection Time    04/11/13  8:34 PM      Result Value Range   D-Dimer, Quant <0.27  0.00 - 0.48 ug/mL-FEU   Comment:            AT THE INHOUSE ESTABLISHED CUTOFF     VALUE OF 0.48 ug/mL FEU,     THIS ASSAY HAS BEEN DOCUMENTED     IN THE LITERATURE TO HAVE     A SENSITIVITY  AND NEGATIVE     PREDICTIVE VALUE OF AT LEAST     98 TO 99%.  THE TEST RESULT     SHOULD BE CORRELATED WITH     AN ASSESSMENT OF THE CLINICAL     PROBABILITY OF DVT / VTE.  URINALYSIS, ROUTINE W REFLEX MICROSCOPIC     Status: Abnormal   Collection Time    04/11/13 10:16 PM      Result Value Range   Color, Urine YELLOW  YELLOW   APPearance CLEAR  CLEAR   Specific Gravity, Urine 1.020  1.005 - 1.030   pH 5.5  5.0 - 8.0   Glucose, UA NEGATIVE  NEGATIVE mg/dL   Hgb urine dipstick SMALL (*) NEGATIVE   Bilirubin Urine NEGATIVE  NEGATIVE   Ketones, ur NEGATIVE  NEGATIVE mg/dL   Protein, ur NEGATIVE  NEGATIVE mg/dL   Urobilinogen, UA 0.2  0.0 - 1.0 mg/dL   Nitrite NEGATIVE  NEGATIVE   Leukocytes, UA NEGATIVE  NEGATIVE  URINE MICROSCOPIC-ADD ON     Status: Abnormal   Collection Time    04/11/13 10:16 PM      Result Value Range   Squamous Epithelial / LPF FEW (*) RARE   WBC, UA 0-2  <3 WBC/hpf   RBC / HPF 0-2  <3 RBC/hpf   Bacteria, UA FEW (*) RARE    Radiological Exams on Admission: Dg Chest 2 View  04/11/2013   CLINICAL  DATA:  Shortness of breath, wheezing.  EXAM: CHEST  2 VIEW  COMPARISON:  October 20, 2010.  FINDINGS: Stable cardiomediastinal silhouette. Status post coronary artery bypass graft. Left-sided pacemaker is unchanged in position. No pleural effusion or pneumothorax is noted. Mild degenerative change of lower thoracic spine is noted. No acute pulmonary disease is noted.  IMPRESSION: No acute cardiopulmonary abnormality seen.   Electronically Signed   By: Roque Lias M.D.   On: 04/11/2013 20:13    Assessment/Plan Principal Problem:   CHF (congestive heart failure) Active Problems:   Mixed hyperlipidemia   Essential hypertension, benign   IMPLANTATION OF DEFIBRILLATOR, HX OF   Atrial fibrillation   CHF exacerbation  79 year old female with multiple medical problems, now being admitted with acute CHF exacerbation. Patient has already received 60 mg of Lasix in the ED.  We'll continue with Lasix 20 mg IV every 12 hours. Will obtain 3 sets of cardiac enzymes, cardiology consultation in the morning. Patient will be continued on the home medications. Will insert Foley catheter for next 24-48 hours to accurately measure urinary output. Patient is currently on Coumadin for A. fib which will be continued in the hospital and pharmacy will dose the Coumadin. We'll also give DuoNeb nebulizers every 6 hours.  Code status: Patient is full code  Family discussion: Discussed with patient's daughter at bedside   Time Spent on Admission: 65 min  [facility] Pager: [phone] 04/11/2013, 11:09 PM  If 7PM-7AM, please contact night-coverage  [email or web address]  Password [email or web address]

## 2013-04-11 NOTE — ED Notes (Signed)
Onset yesterday morning, sob, dry cough, denies pain. Worse tonight, pt sat 89% on room air, place on 2 L 02

## 2013-04-11 NOTE — ED Provider Notes (Signed)
CSN: [id number]     Arrival date & time 04/11/13  1918 History   First MD Initiated Contact with Patient 04/11/13 1928     Chief Complaint  Patient presents with  . Shortness of Breath   (Consider location/radiation/quality/duration/timing/severity/associated sxs/prior Treatment) Patient is a 79 y.o. female presenting with shortness of breath. The history is provided by the patient and a relative.  Shortness of Breath  patient here complaining of dyspnea on exertion as well as orthopnea x24 hours. Denies any chest pain chest pressure. Subjective cough without fever noted. Increased lower extremity edema as well 2. History of CHF. No fever or chills. Denies any nausea vomiting. Symptoms worse with exertion and better with rest. No treatment used prior to arrival and her symptoms have been worsening  Past Medical History  Diagnosis Date  . Osteoarthritis   . Mixed hyperlipidemia   . Coronary atherosclerosis of native coronary artery     Multivessel status post CABG, DES PLA March 2006  . Essential hypertension, benign   . Heart block   . Syncope   . Ventricular tachycardia     Status post ICD  . Ischemic cardiomyopathy     LVEF 55% as of March 2014  . Atrial fibrillation    Past Surgical History  Procedure Laterality Date  . Coronary artery bypass graft      LIMA to LAD, SVG to diagonal, SVG to ramus and OM  . Cardiac defibrillator placement      Guidant device  . Yag laser application Left 12/27/2012    Procedure: YAG LASER APPLICATION;  Surgeon: Loraine Leriche T. Nile Riggs, MD;  Location: [facility];  Service: Ophthalmology;  Laterality: Left;  . Yag laser application Right 01/10/2013    Procedure: YAG LASER APPLICATION;  Surgeon: Loraine Leriche T. Nile Riggs, MD;  Location: [facility];  Service: Ophthalmology;  Laterality: Right;   Family History  Problem Relation Age of Onset  . Heart attack Father   . Heart attack Brother    History  Substance Use Topics  . Smoking status: Never Smoker   . Smokeless  tobacco: Never Used  . Alcohol Use: No   OB History   Grav Para Term Preterm Abortions TAB SAB Ect Mult Living                 Review of Systems  Respiratory: Positive for shortness of breath.   All other systems reviewed and are negative.    Allergies  Codeine  Home Medications   Current Outpatient Rx  Name  Route  Sig  Dispense  Refill  . acetaminophen (TYLENOL) 500 MG tablet   Oral   Take 500 mg by mouth every 6 (six) hours as needed for pain.         [name] amLODipine-atorvastatin (CADUET) 5-20 MG per tablet   Oral   Take 1 tablet by mouth daily.   30 tablet   6   . aspirin 81 MG tablet   Oral   Take 81 mg by mouth daily.           . Calcium Carbonate-Vitamin D (CALCIUM 600 + D PO)   Oral   Take 1 tablet by mouth 2 (two) times daily.         . Cholecalciferol (VITAMIN D) 2000 UNITS CAPS   Oral   Take 1 capsule by mouth daily.           . folic acid (FOLVITE) 1 MG tablet   Oral   Take 1 mg  by mouth 2 (two) times daily.          . hydrochlorothiazide (MICROZIDE) 12.5 MG capsule   Oral   Take 1 capsule (12.5 mg total) by mouth daily.   30 capsule   11   . magnesium oxide (MAG-OX) 400 MG tablet   Oral   Take 400 mg by mouth daily.         . metoprolol (TOPROL-XL) 200 MG 24 hr tablet   Oral   Take 200 mg by mouth daily.           Bertram Gala Glycol-Propyl Glycol (SYSTANE OP)   Both Eyes   Place 1 drop into both eyes daily as needed (Dry eyes).         . potassium chloride SA (K-DUR,KLOR-CON) 20 MEQ tablet   Oral   Take 1 tablet (20 mEq total) by mouth daily.   30 tablet   11   . PRESCRIPTION MEDICATION   Injection   Inject as directed. Steroid injection in foot given by Dr. Pricilla Holm         . telmisartan-hydrochlorothiazide (MICARDIS HCT) 80-12.5 MG per tablet   Oral   Take 1 tablet by mouth daily.           [name] warfarin (COUMADIN) 2.5 MG tablet      Takes 5 mg everyday except Wed and Sun when patient takes 2.5 mg   60  tablet   3    Pulse 76  Temp(Src) 98.3 F (36.8 C) (Oral)  Resp 24  Ht 5\' 1"  (1.549 m)  Wt 170 lb (77.111 kg)  BMI 32.14 kg/m2  SpO2 98% Physical Exam  Nursing note and vitals reviewed. Constitutional: She is oriented to person, place, and time. She appears well-developed and well-nourished.  Non-toxic appearance. No distress.  HENT:  Head: Normocephalic and atraumatic.  Eyes: Conjunctivae, EOM and lids are normal. Pupils are equal, round, and reactive to light.  Neck: Normal range of motion. Neck supple. No tracheal deviation present. No mass present.  Cardiovascular: Normal rate, regular rhythm and normal heart sounds.  Exam reveals no gallop.   No murmur heard. Pulmonary/Chest: Effort normal. No stridor. No respiratory distress. She has decreased breath sounds. She has rhonchi. She has no rales.  Abdominal: Soft. Normal appearance and bowel sounds are normal. She exhibits no distension. There is no tenderness. There is no rebound and no CVA tenderness.  Musculoskeletal: Normal range of motion. She exhibits no edema and no tenderness.  2+ bilateral lower extremity pitting edema  Neurological: She is alert and oriented to person, place, and time. She has normal strength. No cranial nerve deficit or sensory deficit. GCS eye subscore is 4. GCS verbal subscore is 5. GCS motor subscore is 6.  Skin: Skin is warm and dry. No abrasion and no rash noted.  Psychiatric: She has a normal mood and affect. Her speech is normal and behavior is normal.    ED Course  Procedures (including critical care time) Labs Review Labs Reviewed  PRO B NATRIURETIC PEPTIDE  TROPONIN I  CBC WITH DIFFERENTIAL  BASIC METABOLIC PANEL   Imaging Review No results found.  EKG Interpretation    Date/Time:  Tuesday April 11 2013 19:34:33 EST Ventricular Rate:  70 PR Interval:    QRS Duration: 192 QT Interval:  466 QTC Calculation: 503 R Axis:   -87 Text Interpretation:  Ventricular-paced rhythm  Abnormal ECG When compared with ECG of 29-Aug-2007 12:20, Electronic ventricular pacemaker has replaced Wide QRS  rhythm Vent. rate has increased BY  23 BPM Confirmed by Arasely Akkerman  MD, Breanda Greenlaw ([id number]) on 04/11/2013 8:56:26 PM            MDM  No diagnosis found. Patient given Lasix 60 mg here. She was placed on oxygen. She will be admitted for treatment of her CHF    Toy Baker, MD 04/11/13 2118

## 2013-04-12 DIAGNOSIS — I2510 Atherosclerotic heart disease of native coronary artery without angina pectoris: Secondary | ICD-10-CM

## 2013-04-12 DIAGNOSIS — R633 Feeding difficulties: Secondary | ICD-10-CM

## 2013-04-12 DIAGNOSIS — I472 Ventricular tachycardia: Secondary | ICD-10-CM

## 2013-04-12 DIAGNOSIS — I5031 Acute diastolic (congestive) heart failure: Secondary | ICD-10-CM

## 2013-04-12 DIAGNOSIS — Z95810 Presence of automatic (implantable) cardiac defibrillator: Secondary | ICD-10-CM

## 2013-04-12 LAB — PROTIME-INR
INR: 2.38 — ABNORMAL HIGH (ref 0.00–1.49)
Prothrombin Time: 25.2 seconds — ABNORMAL HIGH (ref 11.6–15.2)

## 2013-04-12 LAB — COMPREHENSIVE METABOLIC PANEL
AST: 22 U/L (ref 0–37)
Albumin: 3.9 g/dL (ref 3.5–5.2)
BUN: 20 mg/dL (ref 6–23)
CO2: 26 mEq/L (ref 19–32)
Chloride: 97 mEq/L (ref 96–112)
Creatinine, Ser: 1.03 mg/dL (ref 0.50–1.10)
GFR calc Af Amer: 58 mL/min — ABNORMAL LOW (ref >90)
GFR calc non Af Amer: 50 mL/min — ABNORMAL LOW (ref >90)
Glucose, Bld: 156 mg/dL — ABNORMAL HIGH (ref 70–99)
Potassium: 2.6 mEq/L — CL (ref 3.5–5.1)
Total Bilirubin: 0.7 mg/dL (ref 0.3–1.2)

## 2013-04-12 LAB — CBC
HCT: 39.1 % (ref 36.0–46.0)
Hemoglobin: 13.4 g/dL (ref 12.0–15.0)
MCV: 89.7 fL (ref 78.0–100.0)
RBC: 4.36 MIL/uL (ref 3.87–5.11)
RDW: 13.6 % (ref 11.5–15.5)
WBC: 6 10*3/uL (ref 4.0–10.5)

## 2013-04-12 LAB — TROPONIN I
Troponin I: <0.3 ng/mL (ref <0.30)
Troponin I: <0.3 ng/mL (ref <0.30)

## 2013-04-12 MED ORDER — AMLODIPINE BESYLATE 5 MG PO TABS: 10.0000 mg | ORAL_TABLET | Freq: Every day | ORAL | Status: DC

## 2013-04-12 MED ORDER — WARFARIN SODIUM 5 MG PO TABS: 5.0000 mg | ORAL_TABLET | ORAL | Status: DC

## 2013-04-12 MED ORDER — WARFARIN - PHARMACIST DOSING INPATIENT
Status: DC
Start: 2013-04-12 — End: 2013-04-14
  Administered 2013-04-12: 16:00:00

## 2013-04-12 MED ORDER — POTASSIUM CHLORIDE 20 MEQ/15ML (10%) PO LIQD
40.0000 meq | Freq: Once | ORAL | Status: AC
  Administered 2013-04-12: 40 meq via ORAL
  Filled 2013-04-12: qty 30

## 2013-04-12 MED ORDER — WARFARIN SODIUM 5 MG PO TABS
5.0000 mg | ORAL_TABLET | Freq: Once | ORAL | Status: AC
  Administered 2013-04-12: 5 mg via ORAL
  Filled 2013-04-12: qty 1

## 2013-04-12 MED ORDER — ATORVASTATIN CALCIUM 20 MG PO TABS: 20.0000 mg | ORAL_TABLET | Freq: Every day | ORAL | Status: DC

## 2013-04-12 MED ORDER — AMLODIPINE BESYLATE 5 MG PO TABS
10.0000 mg | ORAL_TABLET | Freq: Every day | ORAL | Status: DC
  Administered 2013-04-12 – 2013-04-13 (×2): 10 mg via ORAL
  Filled 2013-04-12 (×2): qty 2

## 2013-04-12 MED ORDER — WARFARIN SODIUM 2.5 MG PO TABS: 2.5000 mg | ORAL_TABLET | ORAL | Status: DC

## 2013-04-12 MED ORDER — WARFARIN - PHARMACIST DOSING INPATIENT: Freq: Every day | Status: DC

## 2013-04-12 MED ORDER — ATORVASTATIN CALCIUM 20 MG PO TABS
20.0000 mg | ORAL_TABLET | Freq: Every day | ORAL | Status: DC
  Administered 2013-04-12 – 2013-04-13 (×2): 20 mg via ORAL
  Filled 2013-04-12 (×2): qty 1

## 2013-04-12 MED ORDER — POTASSIUM CHLORIDE CRYS ER 20 MEQ PO TBCR
20.0000 meq | EXTENDED_RELEASE_TABLET | Freq: Two times a day (BID) | ORAL | Status: DC
  Administered 2013-04-12 – 2013-04-14 (×5): 20 meq via ORAL
  Filled 2013-04-12 (×5): qty 1

## 2013-04-12 NOTE — Consult Note (Signed)
CARDIOLOGY CONSULT NOTE   Patient ID: [name] [name] MRN: [id number] DOB/AGE: 77-Dec-1933 77 y.o.  Admit Date: 04/11/2013 Referring Physician: Kari Baars MD Primary Physician: Fredirick Maudlin, MD Consulting Cardiologist: Prentice Docker MD Primary Cardiologist: Nona Dell MD  Reason for Consultation: Diastolic CHF  Clinical Summary [name] [name] is a 79 y.o.female admitted with worsening dyspnea, diastolic CHF with Pro-BNP 4599.  The patient began to have symptoms according to her son approximately one week ago. She is very active in her church, and doing a lot last weekend, and began have what she believed to be a chest cold. Breathing status worsened over the last week to the point when she was talking to her daughter (who is an Charity fundraiser) on the phone yesterday she could hardly breathe. As a result, the patient was brought to the emergency room. Blood pressure was 156/70 heart rate 76, O2 sat 98% on 2 L. Chest x-ray was negative for CHF. She was treated in the ER with 60 mg of IV Lasix, albuterol and Proventil nebulizer treatments. She was diuresed approximately 2000 cc overnight. She is breathing some better.  She denies medical noncompliance, but is not always compliant with a low-sodium diet. She denies chest pain or rapid heart rate, no ICD discharges.  She is  concerned about her Medtoonic pacemaker ERI. He has had her pacemaker checked remotely on 04/07/2013 this revealed "battery pulses 2.62 V, normal device function. No episodes recorded." She has a followup appointment scheduled with Dr. Ladona Ridgel on 04/27/2013 for further discussion. Troponin negative X 3, INR 2.38.  She has a history of ischemic cardiomyopathy with history of heart block and then subsequently VT status post ICD placement (followed by Dr. Ladona Ridgel most recent note of Dr. Diona Browner nearing ERI) although with subsequent normalization of LV function. c Echocardiogram from March 2014 revealing mild LVH with LVEF 55-60%,  grade 2 diastolic dysfunction, mild aortic stenosis with mean gradient 11 mm mercury, mild mitral regurgitation, mild left atrial enlargement, device wire in the right heart, mild right atrial enlargement, PASP approximately 50 mm mercury. She has CAD s/p CABG 1998, and DES to PLA in 2006, atrial fibrillation on chronic coumadin therapy, hypertension, and mixed hyperlipidemia.  Allergies  Allergen Reactions  . Codeine Nausea And Vomiting    Medications Scheduled Medications: . ipratropium  0.5 mg Nebulization Q4H   And  . albuterol  2.5 mg Nebulization Q4H  . albuterol  5 mg Nebulization Once  . amLODipine  5 mg Oral QHS   And  . atorvastatin  20 mg Oral QHS  . aspirin  81 mg Oral q morning - 10a  . furosemide  20 mg Intravenous BID  . losartan  100 mg Oral Daily  . metoprolol  200 mg Oral q morning - 10a  . potassium chloride  20 mEq Oral BID  . sodium chloride  3 mL Intravenous Q12H  . warfarin  5 mg Oral Once  . Warfarin - Pharmacist Dosing Inpatient   Does not apply Q24H     PRN Medications:  sodium chloride, sodium chloride   Past Medical History  Diagnosis Date  . Osteoarthritis   . Mixed hyperlipidemia   . Coronary atherosclerosis of native coronary artery     Multivessel status post CABG, DES PLA March 2006  . Essential hypertension, benign   . Heart block   . Syncope   . Ventricular tachycardia     Status post ICD  . Ischemic cardiomyopathy     LVEF  55% as of March 2014  . Atrial fibrillation     Past Surgical History  Procedure Laterality Date  . Coronary artery bypass graft      LIMA to LAD, SVG to diagonal, SVG to ramus and OM  . Cardiac defibrillator placement      Guidant device  . Yag laser application Left 12/27/2012    Procedure: YAG LASER APPLICATION;  Surgeon: Loraine Leriche T. Nile Riggs, MD;  Location: [facility];  Service: Ophthalmology;  Laterality: Left;  . Yag laser application Right 01/10/2013    Procedure: YAG LASER APPLICATION;  Surgeon: Loraine Leriche T.  Nile Riggs, MD;  Location: [facility];  Service: Ophthalmology;  Laterality: Right;    Family History  Problem Relation Age of Onset  . Heart attack Father   . Heart attack Brother     Social History [name] [name] reports that she has never smoked. She has never used smokeless tobacco. [name] [name] reports that she does not drink alcohol.  Review of Systems Otherwise reviewed and negative except as outlined.  Physical Examination Blood pressure 142/85, pulse 60, temperature 98.4 F (36.9 C), temperature source Oral, resp. rate 20, height 5\' 1"  (1.549 m), weight 169 lb 15.6 oz (77.1 kg), SpO2 93.00%.  Intake/Output Summary (Last 24 hours) at 04/12/13 0901 Last data filed at 04/12/13 0740  Gross per 24 hour  Intake      0 ml  Output   2050 ml  Net  -2050 ml    HEENT: Conjunctiva and lids normal, oropharynx clear with moist mucosa. Neck: Supple, no elevated JVP or carotid bruits, no thyromegaly. Lungs: Expiratory wheezes, mild crackles in the bases. Cardiac: Regular rate and rhythm, with 1/6 systolic murmur. Extremities: No pitting edema, distal pulses 2+. Thick  skin around the ankles and pretibial without significant edema. Skin: Warm and dry. Musculoskeletal: No kyphosis. Neuropsychiatric: Alert and oriented x3, affect grossly appropriate.  Prior Cardiac Testing/Procedures 1.Echocardiogram 06/2012  Left ventricle: The cavity size was normal. Wall thickness was increased in a pattern of mild LVH. Systolic function was normal. The estimated ejection fraction was in the range of 55% to 60%. The apex was poorly visualized, so cannot rule out apical wall motion abnormality. Features are consistent with a pseudonormal left ventricular filling pattern, with concomitant abnormal relaxation and increased filling pressure (grade 2 diastolic dysfunction). - Aortic valve: Poorly visualized. Trileaflet; mildly calcified leaflets. There was mild stenosis. Mean gradient: 11mm Hg (S). Peak  gradient: 18mm Hg (S). Valve area: 1.85cm^2(VTI). - Mitral valve: Mild regurgitation. - Left atrium: The atrium was mildly dilated. - Right ventricle: The cavity size was normal. Pacer wire or catheter noted in right ventricle. Systolic function was normal. - Right atrium: The atrium was mildly dilated. - Tricuspid valve: Peak RV-RA gradient:68mm Hg (S). - Pulmonary arteries: PA systolic pressure 49-53 mmHg. - Systemic veins: IVC measured 2.0 cm with normal respirophasic variation, suggesting RA pressure 6-10 mmHg.  2. Cardiac Cath: March 2006 ANGIOGRAPHIC DATA:  1. The right coronary is a fairly large caliber vessel that has not been  previously grafted. There is a posterior descending branch that has some  mild narrowing at the takeoff of the ostium.  2. The second posterolateral artery has focal 80% stenosis leading into an  area that then bifurcates distally. Following percutaneous stenting,  this is reduced to 0% residual Luminal narrowing with a good  angiographic result.  Lab Results  Basic Metabolic Panel:  Recent Labs Lab 04/11/13 1940 04/12/13 0528  NA 139 141  K  3.5 2.6*  CL 98 97  CO2 26 26  GLUCOSE 160* 156*  BUN 18 20  CREATININE 1.02 1.03  CALCIUM 9.4 9.1    Liver Function Tests:  Recent Labs Lab 04/12/13 0528  AST 22  ALT 21  ALKPHOS 55  BILITOT 0.7  PROT 7.2  ALBUMIN 3.9    CBC:  Recent Labs Lab 04/11/13 1940 04/12/13 0528  WBC 9.7 6.0  NEUTROABS 7.7  --   HGB 14.0 13.4  HCT 41.0 39.1  MCV 91.5 89.7  PLT 161 157    Cardiac Enzymes:  Recent Labs Lab 04/11/13 1940 04/12/13 0030 04/12/13 0528  TROPONINI <0.30 <0.30 <0.30    Radiology: Dg Chest 2 View  04/11/2013   CLINICAL DATA:  Shortness of breath, wheezing.  EXAM: CHEST  2 VIEW  COMPARISON:  October 20, 2010.  FINDINGS: Stable cardiomediastinal silhouette. Status post coronary artery bypass graft. Left-sided pacemaker is unchanged in position. No pleural effusion or  pneumothorax is noted. Mild degenerative change of lower thoracic spine is noted. No acute pulmonary disease is noted.  IMPRESSION: No acute cardiopulmonary abnormality seen.   Electronically Signed   By: Roque Lias M.D.   On: 04/11/2013 20:13    ECG: AV paced   Impression and Recommendations  1.Acute CHF in the setting of ICM: Most recent echo in March demonstrated EF of 55%-60%. She denies medical non-compliance, but has admitted to no always being compliant with diet. LEE is resolving and breathing status is improved.  Would not repeat echo at this time. She will need close follow up with Dr. Diona Browner after discharge.   2. Atrial fibrillation: Last pacemaker evaluation in Dec 2014, remote demonstrated RA pacing of 83.52%, and Ventricular pacing of 99.95%. She continues on metoprolol 200 mg daily.  Coumadin per pharmacy, and is followed in the [place] office.  No evidence of anemia.  3. Hypertension: On multiple medications for control to include amlodipine, HCTZ, losartan, as well as metoprolol. Moderately controlled.   4. CAD: S/P CABG in 1998: Most recent cardiac cath was completed in 2006 requiring DES to the Pl of the RCA secondary to progression of disease in the PL. She has continued patency of the LIMA to LAD,SVG to 1st diagonal, sequential to the intermediate and the OM with inferior sub branch bifurcational disease distal to the  intermediate insertion site. She denies recurrent chest pain prior to admission. Continue risk management. Consider repeat echo or ischemic testing for evaluation of progression of CAD.  5. Hypercholesterolemia: Continue atorvastatin. She is on Caduet at home. Most recent labs 01/2013  TC: 111; TG 119; HDL 31; LDL 56.      Signed: Bettey Mare. Lyman Bishop NP [facility] 04/12/2013, 9:01 AM Co-Sign MD

## 2013-04-12 NOTE — Consult Note (Signed)
The patient was seen and examined, and I agree with the assessment and plan as documented above, with modifications as noted below. She is feeling much better with regards to her breathing, and has put out nearly 2 liters of fluid. She denies chest pain and leg swelling. She has grade II diastolic dysfunction and her BP is mildly elevated. I will increase amlodipine to 10 mg daily. With respect to coronary artery disease, I would consider an ischemic evaluation (nuclear stress test) as an outpatient to see if this is contributing in any way to her most recent decompensation. With regards to her pacemaker, she is scheduled to f/u in the near future with Dr. Ladona Ridgel.

## 2013-04-12 NOTE — Progress Notes (Signed)
ANTICOAGULATION CONSULT NOTE - Initial Consult  Pharmacy Consult for Coumadin Indication: atrial fibrillation  Allergies  Allergen Reactions  . Codeine Nausea And Vomiting   Patient Measurements: Height: 5\' 1"  (154.9 cm) Weight: 169 lb 15.6 oz (77.1 kg) IBW/kg (Calculated) : 47.8  Vital Signs: Temp: 98.4 F (36.9 C) (12/24 0615) Temp src: Oral (12/24 0615) BP: 142/85 mmHg (12/24 0615) Pulse Rate: 60 (12/24 0615)  Labs:  Recent Labs  04/11/13 1940 04/12/13 0030 04/12/13 0528  HGB 14.0  --  13.4  HCT 41.0  --  39.1  PLT 161  --  157  LABPROT  --  25.2* 24.0*  INR  --  2.38* 2.23*  CREATININE 1.02  --  1.03  TROPONINI <0.30 <0.30 <0.30    Estimated Creatinine Clearance: 41.6 ml/min (by C-G formula based on Cr of 1.03).  Medical History: Past Medical History  Diagnosis Date  . Osteoarthritis   . Mixed hyperlipidemia   . Coronary atherosclerosis of native coronary artery     Multivessel status post CABG, DES PLA March 2006  . Essential hypertension, benign   . Heart block   . Syncope   . Ventricular tachycardia     Status post ICD  . Ischemic cardiomyopathy     LVEF 55% as of March 2014  . Atrial fibrillation    Medications:  Prescriptions prior to admission  Medication Sig Dispense Refill  . acetaminophen (TYLENOL) 500 MG tablet Take 500 mg by mouth every 6 (six) hours as needed for pain.      [name] amLODipine-atorvastatin (CADUET) 5-20 MG per tablet Take 1 tablet by mouth at bedtime.      [name] aspirin 81 MG tablet Take 81 mg by mouth every morning.       . Calcium Carbonate-Vitamin D (CALCIUM 600 + D PO) Take 1 tablet by mouth 2 (two) times daily.      . Cholecalciferol (VITAMIN D) 2000 UNITS CAPS Take 1 capsule by mouth at bedtime.       . folic acid (FOLVITE) 1 MG tablet Take 1 mg by mouth 2 (two) times daily.       . hydrochlorothiazide (MICROZIDE) 12.5 MG capsule Take 12.5 mg by mouth every morning.       [name] losartan-hydrochlorothiazide (HYZAAR) 100-12.5 MG  per tablet Take 1 tablet by mouth every morning.       . magnesium oxide (MAG-OX) 400 MG tablet Take 400 mg by mouth every morning.       . metoprolol (TOPROL-XL) 200 MG 24 hr tablet Take 200 mg by mouth every morning.       Bertram Gala Glycol-Propyl Glycol (SYSTANE OP) Place 1 drop into both eyes daily as needed (Dry eyes).      . potassium chloride SA (K-DUR,KLOR-CON) 20 MEQ tablet Take 1 tablet (20 mEq total) by mouth daily.  30 tablet  11  . warfarin (COUMADIN) 2.5 MG tablet Take 2.5-5 mg by mouth See admin instructions. *Takes two tablets everyday except takes one tablet on Wednesdays and Sundays*        Assessment: 79yo female who is on chronic Coumadin for h/o afib.  Home dose is listed above.  INR is therapeutic on admission.  Goal of Therapy:  INR 2-3 Monitor platelets by anticoagulation protocol: Yes   Plan:  Coumadin 5mg  po today x 1 (home dose) INR daily  Skyrah Krupp A 04/12/2013,7:40 AM

## 2013-04-12 NOTE — Progress Notes (Signed)
CRITICAL VALUE ALERT  Critical value received:  K+ 2.6  Date of notification:  04/12/2013  Time of notification:  06:16  Critical value read back:yes  Nurse who received alert:  Blair Heys  MD notified (1st page):  Dr. Sudie Bailey  Time of first page:  06:21  MD notified (2nd page):--  Time of second page:--  Responding MD:  Dr. Sudie Bailey  Time MD responded:  06:21

## 2013-04-12 NOTE — Progress Notes (Signed)
Utilization Review Complete  

## 2013-04-12 NOTE — Progress Notes (Signed)
Subjective: She was admitted last night with congestive heart failure. She has received Lasix and says she feels much better. She is still mildly short of breath. Her potassium level is low this morning  Objective: Vital signs in last 24 hours: Temp:  [98.3 F (36.8 C)-99.3 F (37.4 C)] 98.4 F (36.9 C) (12/24 0615) Pulse Rate:  [60-80] 60 (12/24 0615) Resp:  [15-24] 20 (12/24 0615) BP: (142-156)/(57-85) 142/85 mmHg (12/24 0615) SpO2:  [93 %-98 %] 93 % (12/24 0806) Weight:  [77.1 kg (169 lb 15.6 oz)-77.111 kg (170 lb)] 77.1 kg (169 lb 15.6 oz) (12/23 2234) Weight change:  Last BM Date: 04/10/13  Intake/Output from previous day: 12/23 0701 - 12/24 0700 In: -  Out: 2050 [Urine:2050]  PHYSICAL EXAM General appearance: alert, cooperative and no distress Resp: rales bilaterally Cardio: Her heart is regular and I do not hear a gallop. She does have a systolic murmur GI: soft, non-tender; bowel sounds normal; no masses,  no organomegaly Extremities: extremities normal, atraumatic, no cyanosis or edema  Lab Results:    Basic Metabolic Panel:  Recent Labs  16/10/96 1940 04/12/13 0528  NA 139 141  K 3.5 2.6*  CL 98 97  CO2 26 26  GLUCOSE 160* 156*  BUN 18 20  CREATININE 1.02 1.03  CALCIUM 9.4 9.1   Liver Function Tests:  Recent Labs  04/12/13 0528  AST 22  ALT 21  ALKPHOS 55  BILITOT 0.7  PROT 7.2  ALBUMIN 3.9   No results found for this basename: LIPASE, AMYLASE,  in the last 72 hours No results found for this basename: AMMONIA,  in the last 72 hours CBC:  Recent Labs  04/11/13 1940 04/12/13 0528  WBC 9.7 6.0  NEUTROABS 7.7  --   HGB 14.0 13.4  HCT 41.0 39.1  MCV 91.5 89.7  PLT 161 157   Cardiac Enzymes:  Recent Labs  04/11/13 1940 04/12/13 0030 04/12/13 0528  TROPONINI <0.30 <0.30 <0.30   BNP:  Recent Labs  04/11/13 1940  PROBNP 4599.0*   D-Dimer:  Recent Labs  04/11/13 2034  DDIMER <0.27   CBG: No results found for this  basename: GLUCAP,  in the last 72 hours Hemoglobin A1C: No results found for this basename: HGBA1C,  in the last 72 hours Fasting Lipid Panel: No results found for this basename: CHOL, HDL, LDLCALC, TRIG, CHOLHDL, LDLDIRECT,  in the last 72 hours Thyroid Function Tests: No results found for this basename: TSH, T4TOTAL, FREET4, T3FREE, THYROIDAB,  in the last 72 hours Anemia Panel: No results found for this basename: VITAMINB12, FOLATE, FERRITIN, TIBC, IRON, RETICCTPCT,  in the last 72 hours Coagulation:  Recent Labs  04/12/13 0030 04/12/13 0528  LABPROT 25.2* 24.0*  INR 2.38* 2.23*   Urine Drug Screen: Drugs of Abuse  No results found for this basename: labopia, cocainscrnur, labbenz, amphetmu, thcu, labbarb    Alcohol Level: No results found for this basename: ETH,  in the last 72 hours Urinalysis:  Recent Labs  04/11/13 2216  COLORURINE YELLOW  LABSPEC 1.020  PHURINE 5.5  GLUCOSEU NEGATIVE  HGBUR SMALL*  BILIRUBINUR NEGATIVE  KETONESUR NEGATIVE  PROTEINUR NEGATIVE  UROBILINOGEN 0.2  NITRITE NEGATIVE  LEUKOCYTESUR NEGATIVE   Misc. Labs:  ABGS No results found for this basename: PHART, PCO2, PO2ART, TCO2, HCO3,  in the last 72 hours CULTURES No results found for this or any previous visit (from the past 240 hour(s)). Studies/Results: Dg Chest 2 View  04/11/2013   CLINICAL DATA:  Shortness of breath, wheezing.  EXAM: CHEST  2 VIEW  COMPARISON:  October 20, 2010.  FINDINGS: Stable cardiomediastinal silhouette. Status post coronary artery bypass graft. Left-sided pacemaker is unchanged in position. No pleural effusion or pneumothorax is noted. Mild degenerative change of lower thoracic spine is noted. No acute pulmonary disease is noted.  IMPRESSION: No acute cardiopulmonary abnormality seen.   Electronically Signed   By: Roque Lias M.D.   On: 04/11/2013 20:13    Medications:  Prior to Admission:  Prescriptions prior to admission  Medication Sig Dispense Refill  .  acetaminophen (TYLENOL) 500 MG tablet Take 500 mg by mouth every 6 (six) hours as needed for pain.      [name] amLODipine-atorvastatin (CADUET) 5-20 MG per tablet Take 1 tablet by mouth at bedtime.      [name] aspirin 81 MG tablet Take 81 mg by mouth every morning.       . Calcium Carbonate-Vitamin D (CALCIUM 600 + D PO) Take 1 tablet by mouth 2 (two) times daily.      . Cholecalciferol (VITAMIN D) 2000 UNITS CAPS Take 1 capsule by mouth at bedtime.       . folic acid (FOLVITE) 1 MG tablet Take 1 mg by mouth 2 (two) times daily.       . hydrochlorothiazide (MICROZIDE) 12.5 MG capsule Take 12.5 mg by mouth every morning.       [name] losartan-hydrochlorothiazide (HYZAAR) 100-12.5 MG per tablet Take 1 tablet by mouth every morning.       . magnesium oxide (MAG-OX) 400 MG tablet Take 400 mg by mouth every morning.       . metoprolol (TOPROL-XL) 200 MG 24 hr tablet Take 200 mg by mouth every morning.       Bertram Gala Glycol-Propyl Glycol (SYSTANE OP) Place 1 drop into both eyes daily as needed (Dry eyes).      . potassium chloride SA (K-DUR,KLOR-CON) 20 MEQ tablet Take 1 tablet (20 mEq total) by mouth daily.  30 tablet  11  . warfarin (COUMADIN) 2.5 MG tablet Take 2.5-5 mg by mouth See admin instructions. *Takes two tablets everyday except takes one tablet on Wednesdays and Sundays*       Scheduled: . ipratropium  0.5 mg Nebulization Q4H   And  . albuterol  2.5 mg Nebulization Q4H  . albuterol  5 mg Nebulization Once  . amLODipine  5 mg Oral QHS   And  . atorvastatin  20 mg Oral QHS  . aspirin  81 mg Oral q morning - 10a  . furosemide  20 mg Intravenous BID  . losartan  100 mg Oral Daily  . metoprolol  200 mg Oral q morning - 10a  . potassium chloride  20 mEq Oral BID  . sodium chloride  3 mL Intravenous Q12H  . warfarin  5 mg Oral Once  . Warfarin - Pharmacist Dosing Inpatient   Does not apply Q24H   Continuous:  JYN:WGNFAO chloride, sodium chloride  Assesment: She has congestive heart failure.  Echocardiogram done in March of this year showed good ejection fraction but diastolic dysfunction. She has coronary artery occlusive disease. She has an implantable defibrillator/ Principal Problem:   CHF (congestive heart failure) Active Problems:   Mixed hyperlipidemia   Essential hypertension, benign   IMPLANTATION OF DEFIBRILLATOR, HX OF   Atrial fibrillation   CHF exacerbation    Plan: Continue treatments. I have asked for cardiology consultation. She is requesting discharge. I don't think she's quite  ready for that    LOS: 1 day   Wilmot Quevedo L 04/12/2013, 9:12 AM

## 2013-04-12 NOTE — Care Management Note (Addendum)
    Page 1 of 1   04/14/2013     8:45:12 AM   CARE MANAGEMENT NOTE 04/14/2013  Patient:  [name] [name], [name] [name]   Account Number:  [id number]  Date Initiated:  04/12/2013  Documentation initiated by:  Rosemary Holms  Subjective/Objective Assessment:   Pt admitted from home where she lives alone. Independent with ADL. Has DME from previous surgery but not needing it.     Action/Plan:   Anticipated DC Date:  04/13/2013   Anticipated DC Plan:  HOME/SELF CARE      DC Planning Services  CM consult      [facility] Choice  HOME HEALTH   Choice offered to / List presented to:  C-1 Patient        HH arranged  HH-1 RN  HH-2 PT      HH agency  [facility]   Status of service:  Completed, signed off Medicare Important Message given?  YES (If response is "NO", the following Medicare IM given date fields will be blank) Date Medicare IM given:  04/14/2013 Date Additional Medicare IM given:    Discharge Disposition:  HOME W HOME HEALTH SERVICES  Per UR Regulation:    If discussed at Long Length of Stay Meetings, dates discussed:    Comments:  04/14/13 0845 Arlyss Queen, RN BSN CM Pt discharged home today with [facility] RN and PT. Emma with [facility] notified and HH services to start within 48 hours of discharge. No DME needs noted. Pt and pts nurse aware of discharge arrangements.  04/12/13 Rosemary Holms RN BSN CM

## 2013-04-13 LAB — BASIC METABOLIC PANEL
BUN: 40 mg/dL — ABNORMAL HIGH (ref 6–23)
CO2: 28 mEq/L (ref 19–32)
Calcium: 8.8 mg/dL (ref 8.4–10.5)
Chloride: 102 mEq/L (ref 96–112)
Creatinine, Ser: 1.13 mg/dL — ABNORMAL HIGH (ref 0.50–1.10)
GFR calc non Af Amer: 45 mL/min — ABNORMAL LOW (ref >90)
Glucose, Bld: 112 mg/dL — ABNORMAL HIGH (ref 70–99)
Potassium: 3.3 mEq/L — ABNORMAL LOW (ref 3.5–5.1)
Sodium: 142 mEq/L (ref 135–145)

## 2013-04-13 LAB — PROTIME-INR: INR: 2.16 — ABNORMAL HIGH (ref 0.00–1.49)

## 2013-04-13 MED ORDER — FUROSEMIDE 20 MG PO TABS
20.0000 mg | ORAL_TABLET | Freq: Two times a day (BID) | ORAL | Status: DC
  Administered 2013-04-13 – 2013-04-14 (×2): 20 mg via ORAL
  Filled 2013-04-13 (×3): qty 1

## 2013-04-13 MED ORDER — POTASSIUM CHLORIDE CRYS ER 20 MEQ PO TBCR: 20.0000 meq | EXTENDED_RELEASE_TABLET | Freq: Three times a day (TID) | ORAL | Status: DC

## 2013-04-13 MED ORDER — WARFARIN SODIUM 5 MG PO TABS
5.0000 mg | ORAL_TABLET | Freq: Once | ORAL | Status: AC
  Administered 2013-04-13: 5 mg via ORAL
  Filled 2013-04-13: qty 1

## 2013-04-13 MED ORDER — FUROSEMIDE 20 MG PO TABS: 20.0000 mg | ORAL_TABLET | Freq: Two times a day (BID) | ORAL | Status: DC

## 2013-04-13 NOTE — Progress Notes (Signed)
ANTICOAGULATION CONSULT NOTE   Pharmacy Consult for Coumadin Indication: atrial fibrillation  Allergies  Allergen Reactions  . Codeine Nausea And Vomiting   Patient Measurements: Height: 5\' 1"  (154.9 cm) Weight: 169 lb 15.6 oz (77.1 kg) IBW/kg (Calculated) : 47.8  Vital Signs: Temp: 98.3 F (36.8 C) (12/25 0440) Temp src: Oral (12/25 0440) BP: 132/44 mmHg (12/25 0440) Pulse Rate: 65 (12/25 0440)  Labs:  Recent Labs  04/11/13 1940 04/12/13 0030 04/12/13 0528 04/12/13 1117 04/13/13 0540  HGB 14.0  --  13.4  --   --   HCT 41.0  --  39.1  --   --   PLT 161  --  157  --   --   LABPROT  --  25.2* 24.0*  --  23.4*  INR  --  2.38* 2.23*  --  2.16*  CREATININE 1.02  --  1.03  --  1.13*  TROPONINI <0.30 <0.30 <0.30 <0.30  --     Estimated Creatinine Clearance: 37.9 ml/min (by C-G formula based on Cr of 1.13).  Medical History: Past Medical History  Diagnosis Date  . Osteoarthritis   . Mixed hyperlipidemia   . Coronary atherosclerosis of native coronary artery     Multivessel status post CABG, DES PLA March 2006  . Essential hypertension, benign   . Heart block   . Syncope   . Ventricular tachycardia     Status post ICD  . Ischemic cardiomyopathy     LVEF 55% as of March 2014  . Atrial fibrillation    Medications:  Prescriptions prior to admission  Medication Sig Dispense Refill  . acetaminophen (TYLENOL) 500 MG tablet Take 500 mg by mouth every 6 (six) hours as needed for pain.      [name] amLODipine-atorvastatin (CADUET) 5-20 MG per tablet Take 1 tablet by mouth at bedtime.      [name] aspirin 81 MG tablet Take 81 mg by mouth every morning.       . Calcium Carbonate-Vitamin D (CALCIUM 600 + D PO) Take 1 tablet by mouth 2 (two) times daily.      . Cholecalciferol (VITAMIN D) 2000 UNITS CAPS Take 1 capsule by mouth at bedtime.       . folic acid (FOLVITE) 1 MG tablet Take 1 mg by mouth 2 (two) times daily.       . hydrochlorothiazide (MICROZIDE) 12.5 MG capsule Take 12.5  mg by mouth every morning.       [name] losartan-hydrochlorothiazide (HYZAAR) 100-12.5 MG per tablet Take 1 tablet by mouth every morning.       . magnesium oxide (MAG-OX) 400 MG tablet Take 400 mg by mouth every morning.       . metoprolol (TOPROL-XL) 200 MG 24 hr tablet Take 200 mg by mouth every morning.       Bertram Gala Glycol-Propyl Glycol (SYSTANE OP) Place 1 drop into both eyes daily as needed (Dry eyes).      . potassium chloride SA (K-DUR,KLOR-CON) 20 MEQ tablet Take 1 tablet (20 mEq total) by mouth daily.  30 tablet  11  . warfarin (COUMADIN) 2.5 MG tablet Take 2.5-5 mg by mouth See admin instructions. *Takes two tablets everyday except takes one tablet on Wednesdays and Sundays*        Assessment: 79yo female who is on chronic Coumadin for h/o afib.  Home dose is listed above.  INR is therapeutic on admission.  Goal of Therapy:  INR 2-3 Monitor platelets by anticoagulation protocol: Yes  Plan:  Coumadin 5mg  po today x 1 (home dose) INR daily  Raquel James, Kristie Bracewell Bennett 04/13/2013,9:23 AM

## 2013-04-13 NOTE — Progress Notes (Signed)
She feels much better. She has had significant diuresis. She is still on IV Lasix. She has no other new complaints.  She is awake and alert and comfortable. Her temperature 98.3, blood pressure 132/44, pulse 65 and irregular. Her chest is much clearer. She does not have any peripheral edema.  She has CHF. She has coronary artery reason had a pacemaker/defibrillator placed. She has hypertension which is pre-well controlled.  I think she will be able to be discharged home tomorrow. I'm going to have her get the Foley catheter and out and switch her to oral Lasix.

## 2013-04-14 ENCOUNTER — Encounter: Payer: Self-pay | Admitting: Internal Medicine

## 2013-04-14 ENCOUNTER — Other Ambulatory Visit: Payer: Self-pay | Admitting: *Deleted

## 2013-04-14 DIAGNOSIS — I5023 Acute on chronic systolic (congestive) heart failure: Principal | ICD-10-CM

## 2013-04-14 LAB — PROTIME-INR
INR: 2.12 — ABNORMAL HIGH (ref 0.00–1.49)
Prothrombin Time: 23.1 seconds — ABNORMAL HIGH (ref 11.6–15.2)

## 2013-04-14 LAB — BASIC METABOLIC PANEL
BUN: 27 mg/dL — ABNORMAL HIGH (ref 6–23)
Chloride: 103 mEq/L (ref 96–112)
GFR calc Af Amer: 59 mL/min — ABNORMAL LOW (ref >90)
GFR calc non Af Amer: 51 mL/min — ABNORMAL LOW (ref >90)
Glucose, Bld: 130 mg/dL — ABNORMAL HIGH (ref 70–99)
Potassium: 3.9 mEq/L (ref 3.5–5.1)
Sodium: 141 mEq/L (ref 135–145)

## 2013-04-14 LAB — CBC
HCT: 41.3 % (ref 36.0–46.0)
Hemoglobin: 13.9 g/dL (ref 12.0–15.0)
MCHC: 33.7 g/dL (ref 30.0–36.0)
MCV: 93.2 fL (ref 78.0–100.0)
RBC: 4.43 MIL/uL (ref 3.87–5.11)
RDW: 14.4 % (ref 11.5–15.5)
WBC: 6.6 10*3/uL (ref 4.0–10.5)

## 2013-04-14 MED ORDER — MOMETASONE FUROATE 220 MCG/INH IN AEPB: 1.0000 | INHALATION_SPRAY | Freq: Two times a day (BID) | RESPIRATORY_TRACT | Status: DC

## 2013-04-14 MED ORDER — LIVING BETTER WITH HEART FAILURE BOOK
Freq: Once | Status: AC
  Administered 2013-04-14: 14:00:00
  Filled 2013-04-14: qty 1

## 2013-04-14 MED ORDER — FLUTICASONE PROPIONATE HFA 44 MCG/ACT IN AERO
2.0000 | INHALATION_SPRAY | Freq: Two times a day (BID) | RESPIRATORY_TRACT | Status: DC
  Administered 2013-04-14: 2 via RESPIRATORY_TRACT
  Filled 2013-04-14: qty 10.6

## 2013-04-14 NOTE — Plan of Care (Signed)
Problem: Phase III Progression Outcomes Goal: Pain controlled on oral analgesia Outcome: Not Applicable Date Met:  04/14/13 Denies pain Goal: Activity at appropriate level-compared to baseline (UP IN CHAIR FOR HEMODIALYSIS) Outcome: Completed/Met Date Met:  04/14/13 Ambulatory  Problem: Discharge Progression Outcomes Goal: Other Discharge Outcomes/Goals Outcome: Completed/Met Date Met:  04/14/13 Heart failure booklet reviewed with pt and her daughter

## 2013-04-14 NOTE — Progress Notes (Signed)
Subjective: Still SOB when moves around  No CP Objective: Filed Vitals:   04/13/13 2359 04/14/13 0342 04/14/13 0558 04/14/13 0902  BP:   143/73   Pulse:   68   Temp:   97.9 F (36.6 C)   TempSrc:   Oral   Resp:   17   Height:      Weight:      SpO2: 96% 95% 95% 91%   Weight change:   Intake/Output Summary (Last 24 hours) at 04/14/13 0915 Last data filed at 04/14/13 0846  Gross per 24 hour  Intake    540 ml  Output    100 ml  Net    440 ml   Net since admit:  _ 2345 L  General: Alert, awake, oriented x3, in no acute distress Neck:  JVP is normal Heart: Regular rate and rhythm, without murmurs, rubs, gallops.  Lungs: Wheezes with some rhonchi  Decreased airflow.   Exemities:  No edema.   Neuro: Grossly intact, nonfocal.   Lab Results: Results for orders placed during the hospital encounter of 04/11/13 (from the past 24 hour(s))  PROTIME-INR     Status: Abnormal   Collection Time    04/14/13  6:10 AM      Result Value Range   Prothrombin Time 23.1 (*) 11.6 - 15.2 seconds   INR 2.12 (*) 0.00 - 1.49  CBC     Status: None   Collection Time    04/14/13  6:10 AM      Result Value Range   WBC 6.6  4.0 - 10.5 K/uL   RBC 4.43  3.87 - 5.11 MIL/uL   Hemoglobin 13.9  12.0 - 15.0 g/dL   HCT 29.5  62.1 - 30.8 %   MCV 93.2  78.0 - 100.0 fL   MCH 31.4  26.0 - 34.0 pg   MCHC 33.7  30.0 - 36.0 g/dL   RDW 65.7  84.6 - 96.2 %   Platelets 179  150 - 400 K/uL    Studies/Results: @RISRSLT24 @  Medications: Reviewed   @PROBHOSP @  1  Dypsnea.  Patient still ith some SOB though improved    She is wheezing on exam with rhonchi.  I would check BMET and BNP  Also give Asmanax  She is on NMT  2.  Acute on chronic systolic CHF  On exam, fluid status appears improved form admit but need to follow closely.    LOS: 3 days   Dietrich Pates 04/14/2013, 9:15 AM

## 2013-04-14 NOTE — Progress Notes (Signed)
Reviewed labs  Better.  WIll make sure patient has f/u appt in cardiology Add Asmanax to regimen.

## 2013-04-14 NOTE — Discharge Summary (Signed)
Physician Discharge Summary  Patient ID: [name] [name] MRN: [id number] DOB/AGE: 1931/09/20 79 y.o. Primary Care Physician:HAWKINS,EDWARD L, MD Admit date: 04/11/2013 Discharge date: 04/14/2013    Discharge Diagnoses:   Principal Problem:   CHF (congestive heart failure) Active Problems:   Mixed hyperlipidemia   Essential hypertension, benign   IMPLANTATION OF DEFIBRILLATOR, HX OF   Atrial fibrillation   CHF exacerbation     Medication List    STOP taking these medications       hydrochlorothiazide 12.5 MG capsule  Commonly known as:  MICROZIDE      TAKE these medications       acetaminophen 500 MG tablet  Commonly known as:  TYLENOL  Take 500 mg by mouth every 6 (six) hours as needed for pain.     amLODipine-atorvastatin 5-20 MG per tablet  Commonly known as:  CADUET  Take 1 tablet by mouth at bedtime.     aspirin 81 MG tablet  Take 81 mg by mouth every morning.     CALCIUM 600 + D PO  Take 1 tablet by mouth 2 (two) times daily.     folic acid 1 MG tablet  Commonly known as:  FOLVITE  Take 1 mg by mouth 2 (two) times daily.     furosemide 20 MG tablet  Commonly known as:  LASIX  Take 1 tablet (20 mg total) by mouth 2 (two) times daily.     losartan-hydrochlorothiazide 100-12.5 MG per tablet  Commonly known as:  HYZAAR  Take 1 tablet by mouth every morning.     magnesium oxide 400 MG tablet  Commonly known as:  MAG-OX  Take 400 mg by mouth every morning.     metoprolol 200 MG 24 hr tablet  Commonly known as:  TOPROL-XL  Take 200 mg by mouth every morning.     potassium chloride SA 20 MEQ tablet  Commonly known as:  K-DUR,KLOR-CON  Take 1 tablet (20 mEq total) by mouth 3 (three) times daily.     SYSTANE OP  Place 1 drop into both eyes daily as needed (Dry eyes).     Vitamin D 2000 UNITS Caps  Take 1 capsule by mouth at bedtime.     warfarin 2.5 MG tablet  Commonly known as:  COUMADIN  Take 2.5-5 mg by mouth See admin instructions. *Takes two  tablets everyday except takes one tablet on Wednesdays and Sundays*        Discharged Condition: home    Consults: Cardiology  Significant Diagnostic Studies: Dg Chest 2 View  04/11/2013   CLINICAL DATA:  Shortness of breath, wheezing.  EXAM: CHEST  2 VIEW  COMPARISON:  October 20, 2010.  FINDINGS: Stable cardiomediastinal silhouette. Status post coronary artery bypass graft. Left-sided pacemaker is unchanged in position. No pleural effusion or pneumothorax is noted. Mild degenerative change of lower thoracic spine is noted. No acute pulmonary disease is noted.  IMPRESSION: No acute cardiopulmonary abnormality seen.   Electronically Signed   By: Roque Lias M.D.   On: 04/11/2013 20:13    Lab Results: Basic Metabolic Panel:  Recent Labs  56/21/30 0528 04/13/13 0540  NA 141 142  K 2.6* 3.3*  CL 97 102  CO2 26 28  GLUCOSE 156* 112*  BUN 20 40*  CREATININE 1.03 1.13*  CALCIUM 9.1 8.8   Liver Function Tests:  Recent Labs  04/12/13 0528  AST 22  ALT 21  ALKPHOS 55  BILITOT 0.7  PROT 7.2  ALBUMIN 3.9  CBC:  Recent Labs  04/11/13 1940 04/12/13 0528 04/14/13 0610  WBC 9.7 6.0 6.6  NEUTROABS 7.7  --   --   HGB 14.0 13.4 13.9  HCT 41.0 39.1 41.3  MCV 91.5 89.7 93.2  PLT 161 157 179    No results found for this or any previous visit (from the past 240 hour(s)).   Hospital Course:  This is a 79 years old female patient of Dr. Juanetta Gosling was admitted due to acute exacerbation of CHF. She was intimally treated with Iv lasix. Her lasix was changed to oral and patient remained stable. She will be discharged home and will be followed by Dr Juanetta Gosling.   Discharge Exam: Blood pressure 143/73, pulse 68, temperature 97.9 F (36.6 C), temperature source Oral, resp. rate 17, height 5\' 1"  (1.549 m), weight 77.1 kg (169 lb 15.6 oz), SpO2 95.00%.   Disposition:  home      Discharge Orders   Future Appointments Provider Department Dept Phone   04/27/2013 11:10 AM [facility]  Coumadin [facility] [phone]   05/09/2013 8:50 AM [facility] Device Remotes [facility] [phone]   Future Orders Complete By Expires   Face-to-face encounter (required for Medicare/Medicaid patients)  As directed    Comments:     I HAWKINS,EDWARD L certify that this patient is under my care and that I, or a nurse practitioner or physician's assistant working with me, had a face-to-face encounter that meets the physician face-to-face encounter requirements with this patient on 04/13/2013. The encounter with the patient was in whole, or in part for the following medical condition(s) which is the primary reason for home health care (List medical condition): chf   Questions:     The encounter with the patient was in whole, or in part, for the following medical condition, which is the primary reason for home health care:  chf   I certify that, based on my findings, the following services are medically necessary home health services:  Nursing   Physical therapy   My clinical findings support the need for the above services:  Shortness of breath with activity   Further, I certify that my clinical findings support that this patient is homebound due to:  Shortness of Breath with activity   Reason for Medically Necessary Home Health Services:  Skilled Nursing- Change/Decline in Patient Status   Home Health  As directed    Questions:     To provide the following care/treatments:  PT   RN        Signed: Evellyn Tuff   04/14/2013, 7:58 AM

## 2013-04-14 NOTE — Telephone Encounter (Signed)
Noted pt consulted with Dr Tenny Craw and requested an apt next week, TMJ scheduled pt to be seen by KL on 04-17-13 at 1:50pm, Dr Tenny Craw also advised pt at consult that our office will send in an Rx for Asmanex inhaler 1 puff twice daily faxed to [place], sent via escribe pt aware of all instructions/medications sent in

## 2013-04-17 ENCOUNTER — Encounter: Payer: Self-pay | Admitting: *Deleted

## 2013-04-17 ENCOUNTER — Ambulatory Visit (INDEPENDENT_AMBULATORY_CARE_PROVIDER_SITE_OTHER): Payer: Medicare PPO | Admitting: Adult Health

## 2013-04-17 ENCOUNTER — Encounter: Payer: Self-pay | Admitting: Adult Health

## 2013-04-17 VITALS — BP 132/56 | HR 65 | Ht 61.0 in | Wt 161.0 lb

## 2013-04-17 DIAGNOSIS — I509 Heart failure, unspecified: Secondary | ICD-10-CM

## 2013-04-17 DIAGNOSIS — I2589 Other forms of chronic ischemic heart disease: Secondary | ICD-10-CM

## 2013-04-17 DIAGNOSIS — I255 Ischemic cardiomyopathy: Secondary | ICD-10-CM

## 2013-04-17 DIAGNOSIS — I4891 Unspecified atrial fibrillation: Secondary | ICD-10-CM

## 2013-04-17 MED ORDER — GUAIFENESIN ER 600 MG PO TB12: 600.0000 mg | ORAL_TABLET | Freq: Two times a day (BID) | ORAL | Status: DC | PRN

## 2013-04-17 NOTE — Assessment & Plan Note (Addendum)
No evidence of fluid overload. Breathing status is better. Weight is down 8 lbs since admission to the hospital. She is requesting to be seen by Dr. Diona Browner today as well. He suggests repeat echocardiogram for LV fx. She will have BMET drawn today for evaluation of kidney fx and potassium status. She requests only to be seen by Dr. Diona Browner.   I will start her on prn Muccinex for congestion and cough.

## 2013-04-17 NOTE — Assessment & Plan Note (Addendum)
Heart rate is well controlled. She is to continue carvediolol as directed. Continue on coumadin.

## 2013-04-17 NOTE — Progress Notes (Deleted)
Name: [name] [name]    DOB: 01-04-1932  Age: 79 y.o.  MR#: [id number]       PCP:  Fredirick Maudlin, MD      Insurance: Payor: [employer] MEDICARE / Plan: [employer] MEDICARE CHOICE PPO / Product Type: *No Product type* /   CC:    Chief Complaint  Patient presents with  . Hypertension  . Coronary Artery Disease    VS Filed Vitals:   04/17/13 1350  BP: 132/56  Pulse: 65  Height: 5\' 1"  (1.549 m)  Weight: 161 lb (73.029 kg)    Weights Current Weight  04/17/13 161 lb (73.029 kg)  04/11/13 169 lb 15.6 oz (77.1 kg)  03/01/13 178 lb (80.74 kg)    Blood Pressure  BP Readings from Last 3 Encounters:  04/17/13 132/56  04/14/13 143/73  03/01/13 132/78     Admit date:  (Not on file) Last encounter with RMR:  Visit date not found   Allergy Codeine  Current Outpatient Prescriptions  Medication Sig Dispense Refill  . acetaminophen (TYLENOL) 500 MG tablet Take 500 mg by mouth every 6 (six) hours as needed for pain.      [name] amLODipine-atorvastatin (CADUET) 5-20 MG per tablet Take 1 tablet by mouth at bedtime.      [name] aspirin 81 MG tablet Take 81 mg by mouth every morning.       . Calcium Carbonate-Vitamin D (CALCIUM 600 + D PO) Take 1 tablet by mouth 2 (two) times daily.      . Cholecalciferol (VITAMIN D) 2000 UNITS CAPS Take 1 capsule by mouth at bedtime.       . folic acid (FOLVITE) 1 MG tablet Take 1 mg by mouth 2 (two) times daily.       . furosemide (LASIX) 20 MG tablet Take 1 tablet (20 mg total) by mouth 2 (two) times daily.  60 tablet  12  . losartan-hydrochlorothiazide (HYZAAR) 100-12.5 MG per tablet Take 1 tablet by mouth every morning.       . metoprolol (TOPROL-XL) 200 MG 24 hr tablet Take 200 mg by mouth every morning.       . mometasone (ASMANEX) 220 MCG/INH inhaler Inhale 1 puff into the lungs 2 (two) times daily.  1 Inhaler  12  . Polyethyl Glycol-Propyl Glycol (SYSTANE OP) Place 1 drop into both eyes daily as needed (Dry eyes).      . potassium chloride SA (K-DUR,KLOR-CON) 20  MEQ tablet Take 1 tablet (20 mEq total) by mouth 3 (three) times daily.  30 tablet  11  . warfarin (COUMADIN) 2.5 MG tablet Take 2.5-5 mg by mouth See admin instructions. *Takes two tablets everyday except takes one tablet on Wednesdays and Sundays*       No current facility-administered medications for this visit.    Discontinued Meds:    Medications Discontinued During This Encounter  Medication Reason  . magnesium oxide (MAG-OX) 400 MG tablet Error    Patient Active Problem List   Diagnosis Date Noted  . CHF (congestive heart failure) 04/11/2013  . CHF exacerbation 04/11/2013  . Aortic stenosis 10/18/2012  . Encounter for long-term (current) use of anticoagulants 10/08/2011  . Atrial fibrillation 09/21/2011  . Mixed hyperlipidemia 08/25/2008  . Essential hypertension, benign 08/25/2008  . Coronary atherosclerosis of native coronary artery 08/25/2008  . VENTRICULAR TACHYCARDIA 08/25/2008  . IMPLANTATION OF DEFIBRILLATOR, HX OF 08/25/2008    LABS    Component Value Date/Time   NA 141 04/14/2013 1046   NA 142  04/13/2013 0540   NA 141 04/12/2013 0528   K 3.9 04/14/2013 1046   K 3.3* 04/13/2013 0540   K 2.6* 04/12/2013 0528   CL 103 04/14/2013 1046   CL 102 04/13/2013 0540   CL 97 04/12/2013 0528   CO2 25 04/14/2013 1046   CO2 28 04/13/2013 0540   CO2 26 04/12/2013 0528   GLUCOSE 130* 04/14/2013 1046   GLUCOSE 112* 04/13/2013 0540   GLUCOSE 156* 04/12/2013 0528   BUN 27* 04/14/2013 1046   BUN 40* 04/13/2013 0540   BUN 20 04/12/2013 0528   CREATININE 1.02 04/14/2013 1046   CREATININE 1.13* 04/13/2013 0540   CREATININE 1.03 04/12/2013 0528   CREATININE 1.01 01/24/2013 0950   CREATININE 1.05 10/21/2010 1044   CALCIUM 8.7 04/14/2013 1046   CALCIUM 8.8 04/13/2013 0540   CALCIUM 9.1 04/12/2013 0528   GFRNONAA 51* 04/14/2013 1046   GFRNONAA 45* 04/13/2013 0540   GFRNONAA 50* 04/12/2013 0528   GFRAA 59* 04/14/2013 1046   GFRAA 52* 04/13/2013 0540   GFRAA 58* 04/12/2013  0528   CMP     Component Value Date/Time   NA 141 04/14/2013 1046   K 3.9 04/14/2013 1046   CL 103 04/14/2013 1046   CO2 25 04/14/2013 1046   GLUCOSE 130* 04/14/2013 1046   BUN 27* 04/14/2013 1046   CREATININE 1.02 04/14/2013 1046   CREATININE 1.01 01/24/2013 0950   CALCIUM 8.7 04/14/2013 1046   PROT 7.2 04/12/2013 0528   ALBUMIN 3.9 04/12/2013 0528   AST 22 04/12/2013 0528   ALT 21 04/12/2013 0528   ALKPHOS 55 04/12/2013 0528   BILITOT 0.7 04/12/2013 0528   GFRNONAA 51* 04/14/2013 1046   GFRAA 59* 04/14/2013 1046       Component Value Date/Time   WBC 6.6 04/14/2013 0610   WBC 6.0 04/12/2013 0528   WBC 9.7 04/11/2013 1940   HGB 13.9 04/14/2013 0610   HGB 13.4 04/12/2013 0528   HGB 14.0 04/11/2013 1940   HCT 41.3 04/14/2013 0610   HCT 39.1 04/12/2013 0528   HCT 41.0 04/11/2013 1940   MCV 93.2 04/14/2013 0610   MCV 89.7 04/12/2013 0528   MCV 91.5 04/11/2013 1940    Lipid Panel     Component Value Date/Time   CHOL 111 01/24/2013 0946   TRIG 119 01/24/2013 0946   HDL 31* 01/24/2013 0946   CHOLHDL 3.6 01/24/2013 0946   VLDL 24 01/24/2013 0946   LDLCALC 56 01/24/2013 0946    ABG No results found for this basename: phart, pco2, pco2art, po2, po2art, hco3, tco2, acidbasedef, o2sat     Lab Results  Component Value Date   TSH 1.93 10/20/2011   BNP (last 3 results)  Recent Labs  04/11/13 1940 04/14/13 1046  PROBNP 4599.0* 780.0*   Cardiac Panel (last 3 results) No results found for this basename: CKTOTAL, CKMB, TROPONINI, RELINDX,  in the last 72 hours  Iron/TIBC/Ferritin No results found for this basename: iron, tibc, ferritin     EKG Orders placed during the hospital encounter of 04/11/13  . EKG 12-LEAD  . EKG 12-LEAD  . EKG 12-LEAD  . EKG 12-LEAD  . EKG     Prior Assessment and Plan Problem List as of 04/17/2013   Mixed hyperlipidemia   Last Assessment & Plan   03/01/2013 Office Visit Edited 03/01/2013 10:40 AM by Jonelle Sidle, MD     Recent  LDL 56, mild increase in ALT. We went ahead and reduced Lipitor dose as I  suspect she will still have optimal control over time and less risk for side effects. Recheck FLP and LFT for next visit.    Essential hypertension, benign   Last Assessment & Plan   03/01/2013 Office Visit Written 03/01/2013 10:40 AM by Jonelle Sidle, MD     Blood pressure control is good today. No changes made.    Coronary atherosclerosis of native coronary artery   Last Assessment & Plan   03/01/2013 Office Visit Written 03/01/2013 10:39 AM by Jonelle Sidle, MD     Multivessel disease status post CABG, remote DES to PLA in 2006. Patient is on Coumadin with concurrent history of PAF, I did discuss with her stopping aspirin, however she was more comfortable with continuing those. She is not reporting any angina.    VENTRICULAR TACHYCARDIA   Last Assessment & Plan   10/19/2012 Office Visit Written 10/21/2012  8:38 PM by Marinus Maw, MD     She has had no recurrent sustained ventricular arrhythmias. No change in medical therapy.    IMPLANTATION OF DEFIBRILLATOR, HX OF   Last Assessment & Plan   10/19/2012 Office Visit Written 10/21/2012  8:39 PM by Marinus Maw, MD     Her [employer] DDD ICD is working normally. Will recheck in a month as she is very close to ERI.    Atrial fibrillation   Last Assessment & Plan   03/01/2013 Office Visit Written 03/01/2013 10:41 AM by Jonelle Sidle, MD     Paroxysmal, continues on Coumadin.    Encounter for long-term (current) use of anticoagulants   Last Assessment & Plan   10/20/2011 Office Visit Written 10/20/2011 12:56 PM by Herby Abraham, MD     Her continue her for followup.    Aortic stenosis   Last Assessment & Plan   03/01/2013 Office Visit Written 03/01/2013 10:41 AM by Jonelle Sidle, MD     Mild by echocardiogram in March, mean gradient 11 mm mercury. Not symptomatic.    CHF (congestive heart failure)   CHF exacerbation       Imaging: Dg Chest 2  View  04/11/2013   CLINICAL DATA:  Shortness of breath, wheezing.  EXAM: CHEST  2 VIEW  COMPARISON:  October 20, 2010.  FINDINGS: Stable cardiomediastinal silhouette. Status post coronary artery bypass graft. Left-sided pacemaker is unchanged in position. No pleural effusion or pneumothorax is noted. Mild degenerative change of lower thoracic spine is noted. No acute pulmonary disease is noted.  IMPRESSION: No acute cardiopulmonary abnormality seen.   Electronically Signed   By: Roque Lias M.D.   On: 04/11/2013 20:13

## 2013-04-17 NOTE — Patient Instructions (Addendum)
Your physician recommends that you schedule a follow-up appointment in: 3 months with Dr Diona Browner   Your physician has recommended you make the following change in your medication:  1. Mucinex 600 mg Twice a day as needed for cough and congestion  Your physician recommends that you return for lab work today. BMET    Your physician has requested that you have an echocardiogram. Echocardiography is a painless test that uses sound waves to create images of your heart. It provides your doctor with information about the size and shape of your heart and how well your heart's chambers and valves are working. This procedure takes approximately one hour. There are no restrictions for this procedure.

## 2013-04-17 NOTE — Progress Notes (Signed)
HPI: [name] [name] is a 79 year old medically complex patient of Dr. Diona Browner we are following for ongoing assessment and management of hypercholesterolemia, CAD, hyperlipidemia, hypertension, atrial fibrillation, and history of aortic valve stenosis.   She also has a history of ischemic cardiomyopathy, with a history of heart block and subsequent VT status post ICD placement and is followed by Dr. Ladona Ridgel. Most recent echocardiogram completed in March 2014 revealed mild LVH with EF of 55-60%, MR, mild left atrial enlargement. She was last seen by Dr. Diona Browner in November of 2014.  At that time she was without complaint.  Unfortunately she was admitted to the hospital with complaints of dyspnea. She was found to be in CHF and treated with IV diuretics. She was seen by Dr. Darl Householder at that time. Echo was normal in March. No further testing was completed. She diuresed 8 lbs during hospitalization. Lasix dose was increased along with additional potassium.   She is with some complaint of cough and congestion with but no worsening shortness of breath.   Allergies  Allergen Reactions  . Codeine Nausea And Vomiting    Current Outpatient Prescriptions  Medication Sig Dispense Refill  . acetaminophen (TYLENOL) 500 MG tablet Take 500 mg by mouth every 6 (six) hours as needed for pain.      [name] amLODipine-atorvastatin (CADUET) 5-20 MG per tablet Take 1 tablet by mouth at bedtime.      [name] aspirin 81 MG tablet Take 81 mg by mouth every morning.       . Calcium Carbonate-Vitamin D (CALCIUM 600 + D PO) Take 1 tablet by mouth 2 (two) times daily.      . Cholecalciferol (VITAMIN D) 2000 UNITS CAPS Take 1 capsule by mouth at bedtime.       . folic acid (FOLVITE) 1 MG tablet Take 1 mg by mouth 2 (two) times daily.       . furosemide (LASIX) 20 MG tablet Take 1 tablet (20 mg total) by mouth 2 (two) times daily.  60 tablet  12  . losartan-hydrochlorothiazide (HYZAAR) 100-12.5 MG per tablet Take 1 tablet by mouth  every morning.       . metoprolol (TOPROL-XL) 200 MG 24 hr tablet Take 200 mg by mouth every morning.       . mometasone (ASMANEX) 220 MCG/INH inhaler Inhale 1 puff into the lungs 2 (two) times daily.  1 Inhaler  12  . Polyethyl Glycol-Propyl Glycol (SYSTANE OP) Place 1 drop into both eyes daily as needed (Dry eyes).      . potassium chloride SA (K-DUR,KLOR-CON) 20 MEQ tablet Take 1 tablet (20 mEq total) by mouth 3 (three) times daily.  30 tablet  11  . warfarin (COUMADIN) 2.5 MG tablet Take 2.5-5 mg by mouth See admin instructions. *Takes two tablets everyday except takes one tablet on Wednesdays and Sundays*       No current facility-administered medications for this visit.    Past Medical History  Diagnosis Date  . Osteoarthritis   . Mixed hyperlipidemia   . Coronary atherosclerosis of native coronary artery     Multivessel status post CABG, DES PLA March 2006  . Essential hypertension, benign   . Heart block   . Syncope   . Ventricular tachycardia     Status post ICD  . Ischemic cardiomyopathy     LVEF 55% as of March 2014  . Atrial fibrillation     Past Surgical History  Procedure Laterality Date  . Coronary artery bypass  graft      LIMA to LAD, SVG to diagonal, SVG to ramus and OM  . Cardiac defibrillator placement      Guidant device  . Yag laser application Left 12/27/2012    Procedure: YAG LASER APPLICATION;  Surgeon: Loraine Leriche T. Nile Riggs, MD;  Location: [facility];  Service: Ophthalmology;  Laterality: Left;  . Yag laser application Right 01/10/2013    Procedure: YAG LASER APPLICATION;  Surgeon: Loraine Leriche T. Nile Riggs, MD;  Location: [facility];  Service: Ophthalmology;  Laterality: Right;    RUE:AVWUJW of systems complete and found to be negative unless listed above  PHYSICAL EXAM BP 132/56  Pulse 65  Ht 5\' 1"  (1.549 m)  Wt 161 lb (73.029 kg)  BMI 30.44 kg/m2  General: Well developed, well nourished, in no acute distress Head: Eyes PERRLA, No xanthomas.   Normal cephalic and  atramatic  Lungs: Clear bilaterally to auscultation and percussion. No wheezes. Heart: HRRR S1 S2, without MRG.  Pulses are 2+ & equal.            No carotid bruit. No JVD.  No abdominal bruits. No femoral bruits. Abdomen: Bowel sounds are positive, abdomen soft and non-tender without masses or                  Hernia's noted. Msk:  Back normal, normal gait. Normal strength and tone for age. Extremities: No clubbing, cyanosis or edema.  DP +1 Neuro: Alert and oriented X 3. Psych:  Good affect, responds appropriately    ASSESSMENT AND PLAN

## 2013-04-18 LAB — BASIC METABOLIC PANEL
Chloride: 100 mEq/L (ref 96–112)
Glucose, Bld: 104 mg/dL — ABNORMAL HIGH (ref 70–99)
Potassium: 4.5 mEq/L (ref 3.5–5.3)
Sodium: 139 mEq/L (ref 135–145)

## 2013-04-21 ENCOUNTER — Other Ambulatory Visit (HOSPITAL_COMMUNITY): Payer: Medicare PPO

## 2013-04-25 ENCOUNTER — Ambulatory Visit (HOSPITAL_COMMUNITY)
Admission: RE | Admit: 2013-04-25 | Discharge: 2013-04-25 | Disposition: A | Discharge status: Home / Self Care | Payer: Medicare Other | Source: Ambulatory Visit | Attending: Adult Health | Admitting: Adult Health

## 2013-04-25 DIAGNOSIS — I359 Nonrheumatic aortic valve disorder, unspecified: Secondary | ICD-10-CM

## 2013-04-25 DIAGNOSIS — I4891 Unspecified atrial fibrillation: Secondary | ICD-10-CM | POA: Insufficient documentation

## 2013-04-25 DIAGNOSIS — I259 Chronic ischemic heart disease, unspecified: Secondary | ICD-10-CM | POA: Insufficient documentation

## 2013-04-25 DIAGNOSIS — I255 Ischemic cardiomyopathy: Secondary | ICD-10-CM

## 2013-04-25 DIAGNOSIS — I428 Other cardiomyopathies: Secondary | ICD-10-CM | POA: Insufficient documentation

## 2013-04-25 DIAGNOSIS — I10 Essential (primary) hypertension: Secondary | ICD-10-CM | POA: Insufficient documentation

## 2013-04-25 NOTE — Progress Notes (Signed)
*  PRELIMINARY RESULTS* Echocardiogram 2D Echocardiogram has been performed.  [place], [place] 04/25/2013, 11:24 AM

## 2013-04-27 ENCOUNTER — Encounter: Payer: Self-pay | Admitting: *Deleted

## 2013-04-27 ENCOUNTER — Telehealth: Payer: Self-pay | Admitting: *Deleted

## 2013-04-27 DIAGNOSIS — I10 Essential (primary) hypertension: Secondary | ICD-10-CM

## 2013-04-27 NOTE — Telephone Encounter (Signed)
appt made

## 2013-04-27 NOTE — Telephone Encounter (Signed)
Message copied by Truett Mainland on Thu Apr 27, 2013  4:12 PM ------      Message from: MCDOWELL, Aloha Gell      Created: Thu Apr 27, 2013  7:31 AM       Continue the current dose of Lasix for now. Please schedule a followup visit with me in 6 weeks from now. BMET at that time. ------

## 2013-04-28 ENCOUNTER — Other Ambulatory Visit (HOSPITAL_COMMUNITY): Payer: Medicare PPO

## 2013-05-08 ENCOUNTER — Ambulatory Visit (INDEPENDENT_AMBULATORY_CARE_PROVIDER_SITE_OTHER): Payer: Medicare Other | Admitting: *Deleted

## 2013-05-08 DIAGNOSIS — I509 Heart failure, unspecified: Secondary | ICD-10-CM

## 2013-05-08 DIAGNOSIS — Z7901 Long term (current) use of anticoagulants: Secondary | ICD-10-CM

## 2013-05-08 DIAGNOSIS — I4891 Unspecified atrial fibrillation: Secondary | ICD-10-CM

## 2013-05-08 DIAGNOSIS — I2789 Other specified pulmonary heart diseases: Secondary | ICD-10-CM

## 2013-05-08 LAB — POCT INR: INR: 2.7

## 2013-05-09 ENCOUNTER — Ambulatory Visit (INDEPENDENT_AMBULATORY_CARE_PROVIDER_SITE_OTHER): Payer: Medicare Other | Admitting: *Deleted

## 2013-05-09 DIAGNOSIS — Z95810 Presence of automatic (implantable) cardiac defibrillator: Secondary | ICD-10-CM

## 2013-05-09 DIAGNOSIS — I4729 Other ventricular tachycardia: Secondary | ICD-10-CM

## 2013-05-09 DIAGNOSIS — I472 Ventricular tachycardia: Secondary | ICD-10-CM

## 2013-05-10 LAB — MDC_IDC_ENUM_SESS_TYPE_REMOTE
Battery Voltage: 2.62 V
Brady Statistic AS VP Percent: 15.15 %
Brady Statistic AS VS Percent: 0.07 %
Brady Statistic RA Percent Paced: 84.79 %
Brady Statistic RV Percent Paced: 99.92 %
HIGH POWER IMPEDANCE MEASURED VALUE: 59 Ohm
HighPow Impedance: 78 Ohm
Lead Channel Impedance Value: 392 Ohm
Lead Channel Setting Pacing Amplitude: 2 V
Lead Channel Setting Pacing Amplitude: 2.5 V
Lead Channel Setting Pacing Pulse Width: 0.5 ms
Lead Channel Setting Sensing Sensitivity: 0.45 mV
MDC IDC MSMT LEADCHNL RA IMPEDANCE VALUE: 576 Ohm
MDC IDC MSMT LEADCHNL RA SENSING INTR AMPL: 2.7389
MDC IDC SESS DTM: 20150120152456
MDC IDC SET ZONE DETECTION INTERVAL: 300 ms
MDC IDC SET ZONE DETECTION INTERVAL: 350 ms
MDC IDC STAT BRADY AP VP PERCENT: 84.78 %
MDC IDC STAT BRADY AP VS PERCENT: 0.01 %
Zone Setting Detection Interval: 350 ms
Zone Setting Detection Interval: 450 ms

## 2013-05-16 ENCOUNTER — Encounter: Payer: Self-pay | Admitting: *Deleted

## 2013-05-19 ENCOUNTER — Encounter: Payer: Self-pay | Admitting: Internal Medicine

## 2013-06-01 LAB — BASIC METABOLIC PANEL
BUN: 31 mg/dL — ABNORMAL HIGH (ref 6–23)
CALCIUM: 9.7 mg/dL (ref 8.4–10.5)
CO2: 28 mEq/L (ref 19–32)
CREATININE: 1.16 mg/dL — AB (ref 0.50–1.10)
Chloride: 106 mEq/L (ref 96–112)
Glucose, Bld: 103 mg/dL — ABNORMAL HIGH (ref 70–99)
Potassium: 4.6 mEq/L (ref 3.5–5.3)
Sodium: 143 mEq/L (ref 135–145)

## 2013-06-07 ENCOUNTER — Telehealth: Payer: Self-pay | Admitting: Internal Medicine

## 2013-06-07 ENCOUNTER — Encounter: Payer: Self-pay | Admitting: *Deleted

## 2013-06-07 NOTE — Telephone Encounter (Signed)
New message     Question about device
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
## 2013-06-08 NOTE — Telephone Encounter (Signed)
Follow up    Sending remote transmission on Monday---have questions about her device

## 2013-06-09 ENCOUNTER — Ambulatory Visit (HOSPITAL_COMMUNITY)
Admission: RE | Admit: 2013-06-09 | Discharge: 2013-06-09 | Disposition: A | Discharge status: Home / Self Care | Payer: Medicare Other | Source: Ambulatory Visit | Attending: Pulmonary Disease | Admitting: Pulmonary Disease

## 2013-06-09 ENCOUNTER — Telehealth: Payer: Self-pay | Admitting: *Deleted

## 2013-06-09 ENCOUNTER — Other Ambulatory Visit (HOSPITAL_COMMUNITY): Payer: Self-pay | Admitting: Pulmonary Disease

## 2013-06-09 DIAGNOSIS — S1093XA Contusion of unspecified part of neck, initial encounter: Secondary | ICD-10-CM

## 2013-06-09 DIAGNOSIS — S5010XA Contusion of unspecified forearm, initial encounter: Secondary | ICD-10-CM | POA: Insufficient documentation

## 2013-06-09 DIAGNOSIS — S0083XA Contusion of other part of head, initial encounter: Secondary | ICD-10-CM

## 2013-06-09 DIAGNOSIS — S0990XA Unspecified injury of head, initial encounter: Secondary | ICD-10-CM

## 2013-06-09 DIAGNOSIS — W19XXXA Unspecified fall, initial encounter: Secondary | ICD-10-CM | POA: Insufficient documentation

## 2013-06-09 DIAGNOSIS — R413 Other amnesia: Secondary | ICD-10-CM

## 2013-06-09 DIAGNOSIS — G319 Degenerative disease of nervous system, unspecified: Secondary | ICD-10-CM | POA: Insufficient documentation

## 2013-06-09 DIAGNOSIS — S0003XA Contusion of scalp, initial encounter: Secondary | ICD-10-CM | POA: Insufficient documentation

## 2013-06-09 DIAGNOSIS — I6789 Other cerebrovascular disease: Secondary | ICD-10-CM | POA: Insufficient documentation

## 2013-06-09 DIAGNOSIS — R51 Headache: Secondary | ICD-10-CM | POA: Insufficient documentation

## 2013-06-09 DIAGNOSIS — M79609 Pain in unspecified limb: Secondary | ICD-10-CM | POA: Insufficient documentation

## 2013-06-09 NOTE — Telephone Encounter (Signed)
[facility]link reviewed, no episodes.  Patient aware.

## 2013-06-12 ENCOUNTER — Ambulatory Visit (INDEPENDENT_AMBULATORY_CARE_PROVIDER_SITE_OTHER): Payer: Medicare Other | Admitting: Cardiology

## 2013-06-12 ENCOUNTER — Ambulatory Visit (INDEPENDENT_AMBULATORY_CARE_PROVIDER_SITE_OTHER): Payer: Medicare Other | Admitting: *Deleted

## 2013-06-12 ENCOUNTER — Encounter: Payer: Self-pay | Admitting: Cardiology

## 2013-06-12 VITALS — BP 131/72 | HR 69 | Ht 61.0 in | Wt 163.0 lb

## 2013-06-12 DIAGNOSIS — Z5181 Encounter for therapeutic drug level monitoring: Secondary | ICD-10-CM

## 2013-06-12 DIAGNOSIS — I4891 Unspecified atrial fibrillation: Secondary | ICD-10-CM

## 2013-06-12 DIAGNOSIS — I2789 Other specified pulmonary heart diseases: Secondary | ICD-10-CM

## 2013-06-12 DIAGNOSIS — I509 Heart failure, unspecified: Secondary | ICD-10-CM

## 2013-06-12 DIAGNOSIS — I2510 Atherosclerotic heart disease of native coronary artery without angina pectoris: Secondary | ICD-10-CM

## 2013-06-12 DIAGNOSIS — I442 Atrioventricular block, complete: Secondary | ICD-10-CM

## 2013-06-12 DIAGNOSIS — I359 Nonrheumatic aortic valve disorder, unspecified: Secondary | ICD-10-CM

## 2013-06-12 DIAGNOSIS — I5032 Chronic diastolic (congestive) heart failure: Secondary | ICD-10-CM

## 2013-06-12 DIAGNOSIS — I350 Nonrheumatic aortic (valve) stenosis: Secondary | ICD-10-CM

## 2013-06-12 DIAGNOSIS — Z7901 Long term (current) use of anticoagulants: Secondary | ICD-10-CM

## 2013-06-12 LAB — MDC_IDC_ENUM_SESS_TYPE_REMOTE
Brady Statistic AP VP Percent: 91.66 %
Brady Statistic AP VS Percent: 0.02 %
Brady Statistic AS VS Percent: 0.01 %
Brady Statistic RA Percent Paced: 91.68 %
Date Time Interrogation Session: 20150223114323
HighPow Impedance: 56 Ohm
HighPow Impedance: 81 Ohm
Lead Channel Impedance Value: 584 Ohm
Lead Channel Sensing Intrinsic Amplitude: 2.7389
Lead Channel Setting Pacing Amplitude: 2 V
Lead Channel Setting Pacing Pulse Width: 0.5 ms
Lead Channel Setting Sensing Sensitivity: 0.45 mV
MDC IDC MSMT BATTERY VOLTAGE: 2.62 V
MDC IDC MSMT LEADCHNL RV IMPEDANCE VALUE: 448 Ohm
MDC IDC SET LEADCHNL RV PACING AMPLITUDE: 2.5 V
MDC IDC SET ZONE DETECTION INTERVAL: 300 ms
MDC IDC SET ZONE DETECTION INTERVAL: 350 ms
MDC IDC SET ZONE DETECTION INTERVAL: 350 ms
MDC IDC STAT BRADY AS VP PERCENT: 8.3 %
MDC IDC STAT BRADY RV PERCENT PACED: 99.97 %
Zone Setting Detection Interval: 450 ms

## 2013-06-12 LAB — POCT INR: INR: 3.4

## 2013-06-12 NOTE — Assessment & Plan Note (Signed)
No active angina symptoms on medical therapy.

## 2013-06-12 NOTE — Assessment & Plan Note (Signed)
Mild.

## 2013-06-12 NOTE — Assessment & Plan Note (Signed)
Continues on Coumadin, followup in the Coumadin clinic.

## 2013-06-12 NOTE — Assessment & Plan Note (Signed)
Continue current diuretic regimen with potassium supplements. Continue to check weights daily. Followup in 3 months with BMET.

## 2013-06-12 NOTE — Patient Instructions (Addendum)
Your physician recommends that you schedule a follow-up appointment in: 3 months    Your physician recommends that you continue on your current medications as directed. Please refer to the Current Medication list given to you today.    Please have blood work done [employer]) one week before your next visit with Dr.Mcdowell     Thanks for choosing [facility] !

## 2013-06-12 NOTE — Progress Notes (Signed)
Clinical Summary [name] [name] is a 80 y.o.female last seen by [name] in December 2014. She presents for a routine visit. Reports that weight has been stable, fluctuates by a few pounds but has had significant trend either up or down. She continues on diuretic regimen outlined below. Weight today is up 2 pounds by our scales.  Recent followup lab work showed sodium 143, potassium 4.6, BUN 31, creatinine 1.1. Renal function was stable.  Followup echocardiogram from January 2015 showed LVEF 60-65% with grade 2 diastolic dysfunction, mildly sclerotic to stenotic aortic valve with mean gradient 13 mm mercury, mild mitral regurgitation, mild to moderate left atrial enlargement, device wire noted in the right heart, mild tricuspid regurgitation, PASP 46 mm mercury.  She continues on Coumadin with followup through the Coumadin clinic.   Allergies  Allergen Reactions  . Codeine Nausea And Vomiting    Current Outpatient Prescriptions  Medication Sig Dispense Refill  . acetaminophen (TYLENOL) 500 MG tablet Take 500 mg by mouth every 6 (six) hours as needed for pain.      [name] amLODipine-atorvastatin (CADUET) 5-20 MG per tablet Take 1 tablet by mouth at bedtime.      [name] aspirin 81 MG tablet Take 81 mg by mouth every morning.       . Calcium Carbonate-Vitamin D (CALCIUM 600 + D PO) Take 1 tablet by mouth 2 (two) times daily.      . Cholecalciferol (VITAMIN D) 2000 UNITS CAPS Take 1 capsule by mouth at bedtime.       . folic acid (FOLVITE) 1 MG tablet Take 1 mg by mouth 2 (two) times daily.       . furosemide (LASIX) 20 MG tablet Take 1 tablet (20 mg total) by mouth 2 (two) times daily.  60 tablet  12  . losartan-hydrochlorothiazide (HYZAAR) 100-12.5 MG per tablet Take 1 tablet by mouth every morning.       . metoprolol (TOPROL-XL) 200 MG 24 hr tablet Take 200 mg by mouth every morning.       Vladimir Faster Glycol-Propyl Glycol (SYSTANE OP) Place 1 drop into both eyes daily as needed (Dry eyes).        . potassium chloride SA (K-DUR,KLOR-CON) 20 MEQ tablet Take 1 tablet (20 mEq total) by mouth 3 (three) times daily.  30 tablet  11  . warfarin (COUMADIN) 2.5 MG tablet Take 2.5-5 mg by mouth See admin instructions. *Takes two tablets everyday except takes one tablet on Wednesdays and Sundays*       No current facility-administered medications for this visit.    Past Medical History  Diagnosis Date  . Osteoarthritis   . Mixed hyperlipidemia   . Coronary atherosclerosis of native coronary artery     Multivessel status post CABG, DES PLA March 2006  . Essential hypertension, benign   . Heart block   . Syncope   . Ventricular tachycardia     Status post ICD  . Ischemic cardiomyopathy     LVEF 55% as of March 2014  . Atrial fibrillation     Social History [name] [name] reports that she has never smoked. She has never used smokeless tobacco. [name] [name] reports that she does not drink alcohol.  Review of Systems No chest pain, no recent cardiac hospitalizations. She did hit her head on a bedside table, lost her balance while leaning over to check the clock. No syncope. Head CT demonstrated no acute event, small left frontal scalp hematoma. Left arm films  showed no acute abnormalities.  Physical Examination Filed Vitals:   06/12/13 1435  BP: 131/72  Pulse: 69   Filed Weights   06/12/13 1435  Weight: 163 lb (73.936 kg)    Comfortable at rest.  HEENT: Conjunctiva and lids normal, oropharynx clear. Resolving ecchymoses left forehead and periorbital region. Neck: Supple, no elevated JVP or carotid bruits, no thyromegaly.  Lungs: Clear to auscultation, nonlabored breathing at rest.  Cardiac: Regular rate and rhythm, no S3, soft systolic murmur, no pericardial rub.  Abdomen: Soft, nontender, bowel sounds present.  Extremities: No pitting edema, distal pulses 2+.  Skin: Warm and dry.  Musculoskeletal: No kyphosis.  Neuropsychiatric: Alert and oriented x3, affect grossly  appropriate.   Problem List and Plan   Chronic diastolic heart failure Continue current diuretic regimen with potassium supplements. Continue to check weights daily. Followup in 3 months with BMET.  Coronary atherosclerosis of native coronary artery No active angina symptoms on medical therapy.  Aortic stenosis Mild.  Atrial fibrillation Continues on Coumadin, followup in the Coumadin clinic.    Satira Sark, M.D., F.A.C.C.

## 2013-06-14 ENCOUNTER — Encounter: Payer: Self-pay | Admitting: *Deleted

## 2013-06-28 ENCOUNTER — Ambulatory Visit (INDEPENDENT_AMBULATORY_CARE_PROVIDER_SITE_OTHER): Payer: Medicare Other | Admitting: *Deleted

## 2013-06-28 DIAGNOSIS — I509 Heart failure, unspecified: Secondary | ICD-10-CM

## 2013-06-28 DIAGNOSIS — Z7901 Long term (current) use of anticoagulants: Secondary | ICD-10-CM

## 2013-06-28 DIAGNOSIS — I4891 Unspecified atrial fibrillation: Secondary | ICD-10-CM

## 2013-06-28 DIAGNOSIS — Z5181 Encounter for therapeutic drug level monitoring: Secondary | ICD-10-CM

## 2013-06-28 DIAGNOSIS — I2789 Other specified pulmonary heart diseases: Secondary | ICD-10-CM

## 2013-06-28 LAB — POCT INR: INR: 2.7

## 2013-07-03 ENCOUNTER — Encounter: Payer: Self-pay | Admitting: Internal Medicine

## 2013-07-05 ENCOUNTER — Other Ambulatory Visit: Payer: Self-pay | Admitting: Cardiology

## 2013-07-10 ENCOUNTER — Ambulatory Visit: Payer: Medicare PPO | Admitting: Cardiology

## 2013-07-13 ENCOUNTER — Ambulatory Visit (INDEPENDENT_AMBULATORY_CARE_PROVIDER_SITE_OTHER): Payer: Medicare Other | Admitting: *Deleted

## 2013-07-13 DIAGNOSIS — I2589 Other forms of chronic ischemic heart disease: Secondary | ICD-10-CM

## 2013-07-13 DIAGNOSIS — I255 Ischemic cardiomyopathy: Secondary | ICD-10-CM

## 2013-07-13 DIAGNOSIS — I442 Atrioventricular block, complete: Secondary | ICD-10-CM

## 2013-07-13 DIAGNOSIS — I4891 Unspecified atrial fibrillation: Secondary | ICD-10-CM

## 2013-07-13 LAB — MDC_IDC_ENUM_SESS_TYPE_REMOTE
Brady Statistic AP VP Percent: 95.58 %
Brady Statistic AP VS Percent: 0.02 %
Brady Statistic AS VP Percent: 4.4 %
Brady Statistic AS VS Percent: 0.01 %
Brady Statistic RA Percent Paced: 95.59 %
Brady Statistic RV Percent Paced: 99.98 %
Date Time Interrogation Session: 20150330140050
HIGH POWER IMPEDANCE MEASURED VALUE: 50 Ohm
HighPow Impedance: 68 Ohm
Lead Channel Impedance Value: 584 Ohm
Lead Channel Sensing Intrinsic Amplitude: 2.8714
Lead Channel Setting Pacing Amplitude: 2 V
Lead Channel Setting Pacing Amplitude: 2.5 V
Lead Channel Setting Sensing Sensitivity: 0.45 mV
MDC IDC MSMT BATTERY VOLTAGE: 2.62 V
MDC IDC MSMT LEADCHNL RV IMPEDANCE VALUE: 368 Ohm
MDC IDC SET LEADCHNL RV PACING PULSEWIDTH: 0.5 ms
MDC IDC SET ZONE DETECTION INTERVAL: 350 ms
Zone Setting Detection Interval: 300 ms
Zone Setting Detection Interval: 350 ms
Zone Setting Detection Interval: 450 ms

## 2013-07-19 ENCOUNTER — Inpatient Hospital Stay (HOSPITAL_COMMUNITY)
Admission: EM | Admit: 2013-07-19 | Discharge: 2013-07-22 | DRG: 193 | Disposition: A | Discharge status: Home / Self Care | Payer: Medicare Other | Attending: Pulmonary Disease | Admitting: Pulmonary Disease

## 2013-07-19 ENCOUNTER — Encounter (HOSPITAL_COMMUNITY): Payer: Self-pay | Admitting: Emergency Medicine

## 2013-07-19 ENCOUNTER — Emergency Department (HOSPITAL_COMMUNITY): Payer: Medicare Other

## 2013-07-19 DIAGNOSIS — I4891 Unspecified atrial fibrillation: Secondary | ICD-10-CM | POA: Diagnosis present

## 2013-07-19 DIAGNOSIS — I10 Essential (primary) hypertension: Secondary | ICD-10-CM

## 2013-07-19 DIAGNOSIS — N179 Acute kidney failure, unspecified: Secondary | ICD-10-CM | POA: Diagnosis present

## 2013-07-19 DIAGNOSIS — Z8249 Family history of ischemic heart disease and other diseases of the circulatory system: Secondary | ICD-10-CM

## 2013-07-19 DIAGNOSIS — J9600 Acute respiratory failure, unspecified whether with hypoxia or hypercapnia: Secondary | ICD-10-CM | POA: Diagnosis present

## 2013-07-19 DIAGNOSIS — I2589 Other forms of chronic ischemic heart disease: Secondary | ICD-10-CM | POA: Diagnosis present

## 2013-07-19 DIAGNOSIS — Z951 Presence of aortocoronary bypass graft: Secondary | ICD-10-CM

## 2013-07-19 DIAGNOSIS — Z79899 Other long term (current) drug therapy: Secondary | ICD-10-CM

## 2013-07-19 DIAGNOSIS — I5033 Acute on chronic diastolic (congestive) heart failure: Secondary | ICD-10-CM | POA: Diagnosis present

## 2013-07-19 DIAGNOSIS — I359 Nonrheumatic aortic valve disorder, unspecified: Secondary | ICD-10-CM

## 2013-07-19 DIAGNOSIS — E782 Mixed hyperlipidemia: Secondary | ICD-10-CM | POA: Diagnosis present

## 2013-07-19 DIAGNOSIS — I509 Heart failure, unspecified: Secondary | ICD-10-CM | POA: Diagnosis present

## 2013-07-19 DIAGNOSIS — Z7901 Long term (current) use of anticoagulants: Secondary | ICD-10-CM

## 2013-07-19 DIAGNOSIS — J4489 Other specified chronic obstructive pulmonary disease: Secondary | ICD-10-CM | POA: Diagnosis present

## 2013-07-19 DIAGNOSIS — Z95810 Presence of automatic (implantable) cardiac defibrillator: Secondary | ICD-10-CM

## 2013-07-19 DIAGNOSIS — I350 Nonrheumatic aortic (valve) stenosis: Secondary | ICD-10-CM

## 2013-07-19 DIAGNOSIS — J189 Pneumonia, unspecified organism: Principal | ICD-10-CM | POA: Diagnosis present

## 2013-07-19 DIAGNOSIS — M1990 Unspecified osteoarthritis, unspecified site: Secondary | ICD-10-CM | POA: Diagnosis present

## 2013-07-19 DIAGNOSIS — J449 Chronic obstructive pulmonary disease, unspecified: Secondary | ICD-10-CM | POA: Diagnosis present

## 2013-07-19 DIAGNOSIS — I2510 Atherosclerotic heart disease of native coronary artery without angina pectoris: Secondary | ICD-10-CM | POA: Diagnosis present

## 2013-07-19 DIAGNOSIS — R0609 Other forms of dyspnea: Secondary | ICD-10-CM | POA: Diagnosis present

## 2013-07-19 DIAGNOSIS — R0602 Shortness of breath: Secondary | ICD-10-CM | POA: Diagnosis present

## 2013-07-19 LAB — EXPECTORATED SPUTUM ASSESSMENT W REFEX TO RESP CULTURE

## 2013-07-19 LAB — BASIC METABOLIC PANEL
BUN: 28 mg/dL — AB (ref 6–23)
CALCIUM: 9.7 mg/dL (ref 8.4–10.5)
CO2: 27 mEq/L (ref 19–32)
CREATININE: 1.22 mg/dL — AB (ref 0.50–1.10)
Chloride: 102 mEq/L (ref 96–112)
GFR, EST AFRICAN AMERICAN: 48 mL/min — AB (ref >90)
GFR, EST NON AFRICAN AMERICAN: 41 mL/min — AB (ref >90)
Glucose, Bld: 176 mg/dL — ABNORMAL HIGH (ref 70–99)
Potassium: 3.9 mEq/L (ref 3.7–5.3)
Sodium: 143 mEq/L (ref 137–147)

## 2013-07-19 LAB — PHOSPHORUS: Phosphorus: 3.7 mg/dL (ref 2.3–4.6)

## 2013-07-19 LAB — CBC WITH DIFFERENTIAL/PLATELET
BASOS ABS: 0 10*3/uL (ref 0.0–0.1)
Basophils Relative: 0 % (ref 0–1)
Eosinophils Absolute: 0.3 10*3/uL (ref 0.0–0.7)
Eosinophils Relative: 3 % (ref 0–5)
HCT: 39.9 % (ref 36.0–46.0)
Hemoglobin: 13.4 g/dL (ref 12.0–15.0)
LYMPHS PCT: 12 % (ref 12–46)
Lymphs Abs: 1.4 10*3/uL (ref 0.7–4.0)
MCH: 31.3 pg (ref 26.0–34.0)
MCHC: 33.6 g/dL (ref 30.0–36.0)
MCV: 93.2 fL (ref 78.0–100.0)
MONO ABS: 0.9 10*3/uL (ref 0.1–1.0)
Monocytes Relative: 8 % (ref 3–12)
NEUTROS ABS: 8.8 10*3/uL — AB (ref 1.7–7.7)
Neutrophils Relative %: 77 % (ref 43–77)
PLATELETS: 166 10*3/uL (ref 150–400)
RBC: 4.28 MIL/uL (ref 3.87–5.11)
RDW: 13.8 % (ref 11.5–15.5)
WBC: 11.4 10*3/uL — AB (ref 4.0–10.5)

## 2013-07-19 LAB — PRO B NATRIURETIC PEPTIDE: PRO B NATRI PEPTIDE: 2488 pg/mL — AB (ref 0–450)

## 2013-07-19 LAB — I-STAT TROPONIN, ED: Troponin i, poc: 0.02 ng/mL (ref 0.00–0.08)

## 2013-07-19 LAB — EXPECTORATED SPUTUM ASSESSMENT W GRAM STAIN, RFLX TO RESP C

## 2013-07-19 LAB — PROTIME-INR
INR: 2.09 — ABNORMAL HIGH (ref 0.00–1.49)
PROTHROMBIN TIME: 22.8 s — AB (ref 11.6–15.2)

## 2013-07-19 LAB — STREP PNEUMONIAE URINARY ANTIGEN: STREP PNEUMO URINARY ANTIGEN: NEGATIVE

## 2013-07-19 LAB — MAGNESIUM: Magnesium: 2 mg/dL (ref 1.5–2.5)

## 2013-07-19 MED ORDER — ONDANSETRON HCL 4 MG/2ML IJ SOLN: 4.0000 mg | Freq: Four times a day (QID) | INTRAMUSCULAR | Status: DC | PRN

## 2013-07-19 MED ORDER — IPRATROPIUM-ALBUTEROL 0.5-2.5 (3) MG/3ML IN SOLN
3.0000 mL | Freq: Once | RESPIRATORY_TRACT | Status: AC
  Administered 2013-07-19: 3 mL via RESPIRATORY_TRACT
  Filled 2013-07-19: qty 3

## 2013-07-19 MED ORDER — DEXTROSE 5 % IV SOLN
1.0000 g | Freq: Once | INTRAVENOUS | Status: AC
  Administered 2013-07-19: 1 g via INTRAVENOUS
  Filled 2013-07-19: qty 10

## 2013-07-19 MED ORDER — METOPROLOL SUCCINATE ER 50 MG PO TB24
200.0000 mg | ORAL_TABLET | Freq: Every morning | ORAL | Status: DC
  Administered 2013-07-19 – 2013-07-22 (×4): 200 mg via ORAL
  Filled 2013-07-19 (×4): qty 4

## 2013-07-19 MED ORDER — METHYLPREDNISOLONE SODIUM SUCC 40 MG IJ SOLR
40.0000 mg | Freq: Every day | INTRAMUSCULAR | Status: DC
  Administered 2013-07-19 – 2013-07-20 (×2): 40 mg via INTRAVENOUS
  Filled 2013-07-19 (×2): qty 1

## 2013-07-19 MED ORDER — LEVALBUTEROL HCL 0.63 MG/3ML IN NEBU
0.6300 mg | INHALATION_SOLUTION | Freq: Three times a day (TID) | RESPIRATORY_TRACT | Status: DC
  Administered 2013-07-19 – 2013-07-22 (×13): 0.63 mg via RESPIRATORY_TRACT
  Filled 2013-07-19 (×13): qty 3

## 2013-07-19 MED ORDER — DEXTROSE 5 % IV SOLN
1.0000 g | INTRAVENOUS | Status: DC
  Administered 2013-07-20 – 2013-07-22 (×3): 1 g via INTRAVENOUS
  Filled 2013-07-19 (×5): qty 10

## 2013-07-19 MED ORDER — GUAIFENESIN-DM 100-10 MG/5ML PO SYRP
5.0000 mL | ORAL_SOLUTION | ORAL | Status: DC | PRN
  Administered 2013-07-20 – 2013-07-22 (×5): 5 mL via ORAL
  Filled 2013-07-19 (×5): qty 5

## 2013-07-19 MED ORDER — LOSARTAN POTASSIUM 50 MG PO TABS
100.0000 mg | ORAL_TABLET | Freq: Every day | ORAL | Status: DC
  Administered 2013-07-19 – 2013-07-22 (×4): 100 mg via ORAL
  Filled 2013-07-19 (×4): qty 2

## 2013-07-19 MED ORDER — FOLIC ACID 1 MG PO TABS
1.0000 mg | ORAL_TABLET | Freq: Two times a day (BID) | ORAL | Status: DC
  Administered 2013-07-19 – 2013-07-22 (×7): 1 mg via ORAL
  Filled 2013-07-19 (×7): qty 1

## 2013-07-19 MED ORDER — LOSARTAN POTASSIUM-HCTZ 100-12.5 MG PO TABS: 1.0000 | ORAL_TABLET | Freq: Every morning | ORAL | Status: DC

## 2013-07-19 MED ORDER — DEXTROSE 5 % IV SOLN
500.0000 mg | INTRAVENOUS | Status: DC
  Administered 2013-07-20 – 2013-07-22 (×3): 500 mg via INTRAVENOUS
  Filled 2013-07-19 (×5): qty 500

## 2013-07-19 MED ORDER — AMLODIPINE-ATORVASTATIN 5-20 MG PO TABS: 1.0000 | ORAL_TABLET | Freq: Every day | ORAL | Status: DC

## 2013-07-19 MED ORDER — FUROSEMIDE 10 MG/ML IJ SOLN
40.0000 mg | Freq: Once | INTRAMUSCULAR | Status: AC
  Administered 2013-07-19: 40 mg via INTRAVENOUS
  Filled 2013-07-19: qty 4

## 2013-07-19 MED ORDER — POTASSIUM CHLORIDE CRYS ER 20 MEQ PO TBCR
20.0000 meq | EXTENDED_RELEASE_TABLET | Freq: Three times a day (TID) | ORAL | Status: DC
  Administered 2013-07-19 – 2013-07-22 (×10): 20 meq via ORAL
  Filled 2013-07-19 (×10): qty 1

## 2013-07-19 MED ORDER — SODIUM CHLORIDE 0.9 % IJ SOLN: 3.0000 mL | INTRAMUSCULAR | Status: DC | PRN

## 2013-07-19 MED ORDER — FUROSEMIDE 10 MG/ML IJ SOLN
40.0000 mg | Freq: Every day | INTRAMUSCULAR | Status: DC
  Administered 2013-07-19: 40 mg via INTRAVENOUS
  Filled 2013-07-19: qty 4

## 2013-07-19 MED ORDER — LEVALBUTEROL HCL 0.63 MG/3ML IN NEBU
0.6300 mg | INHALATION_SOLUTION | Freq: Four times a day (QID) | RESPIRATORY_TRACT | Status: DC | PRN
  Administered 2013-07-20: 0.63 mg via RESPIRATORY_TRACT
  Filled 2013-07-19: qty 3

## 2013-07-19 MED ORDER — ALBUTEROL SULFATE (2.5 MG/3ML) 0.083% IN NEBU
5.0000 mg | INHALATION_SOLUTION | Freq: Once | RESPIRATORY_TRACT | Status: AC
  Administered 2013-07-19: 5 mg via RESPIRATORY_TRACT
  Filled 2013-07-19: qty 6

## 2013-07-19 MED ORDER — HYDROCHLOROTHIAZIDE 12.5 MG PO CAPS
12.5000 mg | ORAL_CAPSULE | Freq: Every day | ORAL | Status: DC
  Administered 2013-07-19: 12.5 mg via ORAL
  Filled 2013-07-19: qty 1

## 2013-07-19 MED ORDER — ATORVASTATIN CALCIUM 20 MG PO TABS
20.0000 mg | ORAL_TABLET | Freq: Every day | ORAL | Status: DC
  Administered 2013-07-19 – 2013-07-21 (×3): 20 mg via ORAL
  Filled 2013-07-19 (×3): qty 1

## 2013-07-19 MED ORDER — ONDANSETRON HCL 4 MG PO TABS: 4.0000 mg | ORAL_TABLET | Freq: Four times a day (QID) | ORAL | Status: DC | PRN

## 2013-07-19 MED ORDER — SODIUM CHLORIDE 0.9 % IJ SOLN
3.0000 mL | Freq: Two times a day (BID) | INTRAMUSCULAR | Status: DC
  Administered 2013-07-19 – 2013-07-22 (×6): 3 mL via INTRAVENOUS

## 2013-07-19 MED ORDER — ACETAMINOPHEN 500 MG PO TABS: 500.0000 mg | ORAL_TABLET | Freq: Four times a day (QID) | ORAL | Status: DC | PRN

## 2013-07-19 MED ORDER — AZITHROMYCIN 500 MG IV SOLR
500.0000 mg | Freq: Once | INTRAVENOUS | Status: AC
  Administered 2013-07-19: 500 mg via INTRAVENOUS

## 2013-07-19 MED ORDER — LEVALBUTEROL HCL 0.63 MG/3ML IN NEBU: 0.6300 mg | INHALATION_SOLUTION | Freq: Four times a day (QID) | RESPIRATORY_TRACT | Status: DC

## 2013-07-19 MED ORDER — ASPIRIN EC 81 MG PO TBEC
81.0000 mg | DELAYED_RELEASE_TABLET | Freq: Every day | ORAL | Status: DC
  Administered 2013-07-19 – 2013-07-22 (×4): 81 mg via ORAL
  Filled 2013-07-19 (×4): qty 1

## 2013-07-19 MED ORDER — WARFARIN SODIUM 2.5 MG PO TABS
2.5000 mg | ORAL_TABLET | Freq: Once | ORAL | Status: AC
  Administered 2013-07-19: 2.5 mg via ORAL
  Filled 2013-07-19: qty 1

## 2013-07-19 MED ORDER — FUROSEMIDE 10 MG/ML IJ SOLN: 40.0000 mg | Freq: Two times a day (BID) | INTRAMUSCULAR | Status: DC

## 2013-07-19 MED ORDER — WARFARIN - PHARMACIST DOSING INPATIENT: Status: DC

## 2013-07-19 MED ORDER — HYDROCODONE-ACETAMINOPHEN 5-325 MG PO TABS: 1.0000 | ORAL_TABLET | ORAL | Status: DC | PRN

## 2013-07-19 MED ORDER — AMLODIPINE BESYLATE 5 MG PO TABS
5.0000 mg | ORAL_TABLET | Freq: Every day | ORAL | Status: DC
  Administered 2013-07-19 – 2013-07-21 (×3): 5 mg via ORAL
  Filled 2013-07-19 (×3): qty 1

## 2013-07-19 MED ORDER — SODIUM CHLORIDE 0.9 % IV SOLN: 250.0000 mL | INTRAVENOUS | Status: DC | PRN

## 2013-07-19 NOTE — ED Notes (Signed)
Respiratory distress, in by ems from home.  Ems reports pt sats in the 80s, placed on O2 and then c-pap with improvement of same. Pt denies chest pain

## 2013-07-19 NOTE — Consult Note (Signed)
Primary cardiologist: Dr.Sam Domenic Polite Consulting cardiologist: Dr. Carlyle Dolly  Clinical Summary [name] [name] is a 80 y.o.female history of CAD with prior CABG in 1998, prior PCI 06/2004 with DES to PLV, HTN, heart block with previous  pacemaker, VT with syncope with pacemaker replaced by ICD, and afib on coumadin.   From further cardiac history, her las cath in 2006 showed LAD diffuse 90%, patent LIMA-LAD, SVG to Diag patent with mild 40% takeoff, LCX occluded, SVG-OM patent, RCA with PL Ermias Tomeo with 75% stenosis. The PL Mearl Harewood was stented. 04/2014 Echo LVEF 60-65%, grade II diastolic dysfunction  Admitted with shortness of breath with associated productive cough. No chest pain. Denies any LE edema, no weight gain. In ER found to be hypoxic with sats in 80s, initially started on Bipap then transitioned to nasal canula.   INR 2.1, pro-BNP 2488, WBC 11.4, Cr 1.22 (baseline approx 1.1-1.2), BUN 28, GFR 41, K 3.9, Trop neg x 1, Mg 2.0. CXR with cardiomegaly, hazy right basilar atelectasis vs infiltrate, no edema. Weight at last appointment with Dr Domenic Polite 163 lbs, today 162 lbs. EKG a-sensed V-paced  She has been started on abx per primary team for CAP, as well as IV lasix for possible diastolic heart failure. She reports medication and dietary compliance.     Allergies  Allergen Reactions  . Codeine Nausea And Vomiting    Medications Scheduled Medications: . amLODipine  5 mg Oral QHS   And  . atorvastatin  20 mg Oral QHS  . aspirin EC  81 mg Oral Daily  . azithromycin  500 mg Intravenous Q24H  . cefTRIAXone (ROCEPHIN)  IV  1 g Intravenous Q24H  . folic acid  1 mg Oral BID  . furosemide  40 mg Intravenous Daily  . losartan  100 mg Oral Daily   And  . hydrochlorothiazide  12.5 mg Oral Daily  . levalbuterol  0.63 mg Nebulization TID PC & HS  . methylPREDNISolone (SOLU-MEDROL) injection  40 mg Intravenous Daily  . metoprolol  200 mg Oral q morning - 10a  . potassium chloride SA   20 mEq Oral TID  . sodium chloride  3 mL Intravenous Q12H  . warfarin  2.5 mg Oral Once  . [START ON 07/20/2013] Warfarin - Pharmacist Dosing Inpatient   Does not apply Q24H     Infusions:     PRN Medications:  sodium chloride, acetaminophen, guaiFENesin-dextromethorphan, HYDROcodone-acetaminophen, levalbuterol, ondansetron (ZOFRAN) IV, ondansetron, sodium chloride   Past Medical History  Diagnosis Date  . Osteoarthritis   . Mixed hyperlipidemia   . Coronary atherosclerosis of native coronary artery     Multivessel status post CABG, DES PLA March 2006  . Essential hypertension, benign   . Heart block   . Syncope   . Ventricular tachycardia     Status post ICD  . Ischemic cardiomyopathy     LVEF 55% as of March 2014  . Atrial fibrillation     Past Surgical History  Procedure Laterality Date  . Coronary artery bypass graft      LIMA to LAD, SVG to diagonal, SVG to ramus and OM  . Cardiac defibrillator placement      Guidant device  . Yag laser application Left 09/24/2092    Procedure: YAG LASER APPLICATION;  Surgeon: Elta Guadeloupe T. Gershon Crane, MD;  Location: [facility];  Service: Ophthalmology;  Laterality: Left;  . Yag laser application Right 10/26/6281    Procedure: YAG LASER APPLICATION;  Surgeon: Elta Guadeloupe T. Gershon Crane, MD;  Location: [facility];  Service: Ophthalmology;  Laterality: Right;    Family History  Problem Relation Age of Onset  . Heart attack Father   . Heart attack Brother     Social History [name] [name] reports that she has never smoked. She has never used smokeless tobacco. [name] [name] reports that she does not drink alcohol.  Review of Systems CONSTITUTIONAL: No weight loss, fever, chills, weakness or fatigue.  HEENT: Eyes: No visual loss, blurred vision, double vision or yellow sclerae. No hearing loss, sneezing, congestion, runny nose or sore throat.  SKIN: No rash or itching.  CARDIOVASCULAR: per HPI RESPIRATORY: per HPI GASTROINTESTINAL: No anorexia, nausea, vomiting or  diarrhea. No abdominal pain or blood.  GENITOURINARY: no polyuria, no dysuria NEUROLOGICAL: No headache, dizziness, syncope, paralysis, ataxia, numbness or tingling in the extremities. No change in bowel or bladder control.  MUSCULOSKELETAL: No muscle, back pain, joint pain or stiffness.  HEMATOLOGIC: No anemia, bleeding or bruising.  LYMPHATICS: No enlarged nodes. No history of splenectomy.  PSYCHIATRIC: No history of depression or anxiety.      Physical Examination Blood pressure 112/52, pulse 72, temperature 98.6 F (37 C), temperature source Oral, resp. rate 22, height 5\' 1"  (1.549 m), weight 162 lb 11.2 oz (73.8 kg), SpO2 96.00%.  Intake/Output Summary (Last 24 hours) at 07/19/13 1455 Last data filed at 07/19/13 0912  Gross per 24 hour  Intake    240 ml  Output    450 ml  Net   -210 ml    Cardiovascular: RRR, no m/r/g, no JVD, no carotid bruits  Respiratory: diffuse expiratory wheeze   GI: abdomen soft, NT, ND  MSK: trace bilateral edema  Neuro: no focal deficits  Psych: appropriate affect   Lab Results  Basic Metabolic Panel:  Recent Labs Lab 07/19/13 0724 07/19/13 0734  NA 143  --   K 3.9  --   CL 102  --   CO2 27  --   GLUCOSE 176*  --   BUN 28*  --   CREATININE 1.22*  --   CALCIUM 9.7  --   MG  --  2.0  PHOS  --  3.7    Liver Function Tests: No results found for this basename: AST, ALT, ALKPHOS, BILITOT, PROT, ALBUMIN,  in the last 168 hours  CBC:  Recent Labs Lab 07/19/13 0724  WBC 11.4*  NEUTROABS 8.8*  HGB 13.4  HCT 39.9  MCV 93.2  PLT 166    Cardiac Enzymes: No results found for this basename: CKTOTAL, CKMB, CKMBINDEX, TROPONINI,  in the last 168 hours  BNP: No components found with this basename: POCBNP,    ECG A-sensed v-paced  Imaging 05/2004 Cath ANGIOGRAPHIC DATA.:  1. Ventriculography was performed in the RAO projection. The ventricle  appears to be mildly dilated but cannot appreciate any significant  definite  wall motion abnormalities.  2. The LAD is segmentally and diffusely diseased with 90% narrowing in the  proximal to mid vessel. Just after this, there was competitive filling  in both the diagonal system as well as the LAD. The LAD is diffusely  diseased prior to its location of competitive filling.  3. The internal mammary to the distal left anterior descending artery is  widely patent.  4. The saphenous vein graft to the diagonal appears to be patent. There is  perhaps 40% narrowing at its insertion but this does not appear to be  tight  5. The circumflex is basically occluded after the small  AV circumflex.  6. The vein graft sequentially to the intermediate and OM is widely patent.  There is a subbranch of the OM that has about a 75% area of narrowing  but is a relatively small caliber vessel.  7. The right coronary is a moderately large vessel was fairly smooth. The  posterolateral Marcellis Frampton has a focal 75% stenosis that divides distally  into two smaller branches, but courses out laterally.  HEMODYNAMIC DATA:  1. Right atrial pressure 10.  2. RV 45/8.  3. Pulmonary artery 41/14, mean 25.  4. Pulmonary capillary wedge mean 15.  5. LV 170/70.  6. AO 175/16  7. Inferior vena cava saturation 74%.  8. Pulmonary artery saturation 73%.  9. Aortic saturation 96%  10. Fick cardiac output 4.9 liters per minute.  11. Fick cardiac index 2.7 liters per minute per meter squared.  CONCLUSION:  1. Preserved overall left ventricular function.  2. Continued patency of the internal mammary to the left anterior  descending.  3. Continued patency of saphenous vein graft to the diagonal.  4. Continued patency sequentially to the intermediate and obtuse marginal  with inferior sub Cambren Helm bifurcational disease distal to the  intermediate insertion site.  5. Progression of disease in the posterolateral artery and right coronary  artery.  DISPOSITION: The patient will be treated with fluids. We will  bring her  back to the office on Friday to discuss the options which would include  possible percutaneous intervention.   ANGIOGRAPHIC DATA:  1. The right coronary is a fairly large caliber vessel that has not been  previously grafted. There is a posterior descending Maura Braaten that has some  mild narrowing at the takeoff of the ostium.  2. The second posterolateral artery has focal 80% stenosis leading into an  area that then bifurcates distally. Following percutaneous stenting,  this is reduced to 0% residual Luminal narrowing with a good  angiographic result.   Jan 2015 Echo LVEF 60-65%, grade II diastolic dysfunction, mild AS, mild MR, mild TR, PASP 46      Impression/Recommendations 1. CAP - initially very symptomatic on bipap, weaned rather quickly to [facility] and now back on room air - abx per primary team  2. Acute on chronic diastolic heart failure - likely mild overload, exacerbating factor likely her infection. CXR, physical exam, and stable weight do not support severe fluid accumulation.   - she has received one dose of IV lasix earlier today, will transition to her oral regimen tomorrow.  - recent echo Jan 2015, I do not see a strong indication to repeat now that she has improved so quickly with treatment. Will cancel repeat echo  3. CAD - no symptoms  - continue current meds  4. Afib - denies any palpitations - EKG with sinus rhythm, normal rate - continue current meds and coumadin    Carlyle Dolly, M.D., F.A.C.C.

## 2013-07-19 NOTE — Care Management Utilization Note (Signed)
UR completed 

## 2013-07-19 NOTE — Progress Notes (Signed)
ANTICOAGULATION CONSULT NOTE - Initial Consult  Pharmacy Consult for Coumadin Indication: atrial fibrillation  Allergies  Allergen Reactions  . Codeine Nausea And Vomiting    Patient Measurements: Height: 5\' 1"  (154.9 cm) Weight: 162 lb 11.2 oz (73.8 kg) IBW/kg (Calculated) : 47.8  Vital Signs: Temp: 98.5 F (36.9 C) (04/01 0959) Temp src: Oral (04/01 0959) BP: 133/66 mmHg (04/01 0959) Pulse Rate: 78 (04/01 0959)  Labs:  Recent Labs  07/19/13 0724  HGB 13.4  HCT 39.9  PLT 166  LABPROT 22.8*  INR 2.09*  CREATININE 1.22*    Estimated Creatinine Clearance: 34.4 ml/min (by C-G formula based on Cr of 1.22).   Medical History: Past Medical History  Diagnosis Date  . Osteoarthritis   . Mixed hyperlipidemia   . Coronary atherosclerosis of native coronary artery     Multivessel status post CABG, DES PLA March 2006  . Essential hypertension, benign   . Heart block   . Syncope   . Ventricular tachycardia     Status post ICD  . Ischemic cardiomyopathy     LVEF 55% as of March 2014  . Atrial fibrillation     Medications:  Prescriptions prior to admission  Medication Sig Dispense Refill  . acetaminophen (TYLENOL) 500 MG tablet Take 500 mg by mouth every 6 (six) hours as needed for pain.      [name] amLODipine-atorvastatin (CADUET) 5-20 MG per tablet Take 1 tablet by mouth at bedtime.      [name] aspirin EC 81 MG tablet Take 81 mg by mouth daily.      . Calcium Carbonate-Vitamin D (CALCIUM 600 + D PO) Take 1 tablet by mouth 2 (two) times daily.      . Cholecalciferol (VITAMIN D) 2000 UNITS CAPS Take 1 capsule by mouth at bedtime.       . folic acid (FOLVITE) 1 MG tablet Take 1 mg by mouth 2 (two) times daily.       . furosemide (LASIX) 20 MG tablet Take 1 tablet (20 mg total) by mouth 2 (two) times daily.  60 tablet  12  . losartan-hydrochlorothiazide (HYZAAR) 100-12.5 MG per tablet Take 1 tablet by mouth every morning.       . metoprolol (TOPROL-XL) 200 MG 24 hr tablet Take  200 mg by mouth every morning.       Vladimir Faster Glycol-Propyl Glycol (SYSTANE OP) Place 1 drop into both eyes daily as needed (Dry eyes).      . potassium chloride SA (K-DUR,KLOR-CON) 20 MEQ tablet Take 1 tablet (20 mEq total) by mouth 3 (three) times daily.  30 tablet  11  . warfarin (COUMADIN) 2.5 MG tablet Take 2.5-5 mg by mouth daily. Patient takes 1 tablet(2.5mg ) on Mon.,Wed.,Fri., and 2 tablets all other days        Assessment: 80 yo F on chronic Coumadin for Afib.  Home dose listed above.  INR therapeutic on admission.   She was admitted with CAP & started on Rocephin & Zithromax which may interact to cause elevated INR.  No bleeding noted.   Goal of Therapy:  INR 2-3   Plan:  Coumadin 2.5mg  po x1 today per home regimen Daily INR  [name] [name] 07/19/2013,11:10 AM

## 2013-07-19 NOTE — ED Provider Notes (Signed)
CSN: [id number]     Arrival date & time 07/19/13  8588 History   First MD Initiated Contact with Patient 07/19/13 [phone]     Chief Complaint  Patient presents with  . Respiratory Distress     Patient is a 80 y.o. female presenting with shortness of breath. The history is provided by the patient.  Shortness of Breath Severity:  Severe Onset quality:  Gradual Duration:  2 days Timing:  Constant Progression:  Worsening Chronicity:  Recurrent Relieved by:  Rest Worsened by:  Activity Associated symptoms: cough and wheezing   Associated symptoms: no abdominal pain, no chest pain, no fever, no hemoptysis, no syncope and no vomiting   pt reports she has had increased SOB for past 2 days No hemoptysis No CP No ICD shocks She is not on home oxygen She feels she may be in heart failure   Past Medical History  Diagnosis Date  . Osteoarthritis   . Mixed hyperlipidemia   . Coronary atherosclerosis of native coronary artery     Multivessel status post CABG, DES PLA March 2006  . Essential hypertension, benign   . Heart block   . Syncope   . Ventricular tachycardia     Status post ICD  . Ischemic cardiomyopathy     LVEF 55% as of March 2014  . Atrial fibrillation    Past Surgical History  Procedure Laterality Date  . Coronary artery bypass graft      LIMA to LAD, SVG to diagonal, SVG to ramus and OM  . Cardiac defibrillator placement      Guidant device  . Yag laser application Left 10/21/1285    Procedure: YAG LASER APPLICATION;  Surgeon: Elta Guadeloupe T. Gershon Crane, MD;  Location: [facility];  Service: Ophthalmology;  Laterality: Left;  . Yag laser application Right 8/67/6720    Procedure: YAG LASER APPLICATION;  Surgeon: Elta Guadeloupe T. Gershon Crane, MD;  Location: [facility];  Service: Ophthalmology;  Laterality: Right;   Family History  Problem Relation Age of Onset  . Heart attack Father   . Heart attack Brother    History  Substance Use Topics  . Smoking status: Never Smoker   . Smokeless tobacco:  Never Used  . Alcohol Use: No   OB History   Grav Para Term Preterm Abortions TAB SAB Ect Mult Living                 Review of Systems  Constitutional: Negative for fever.  Respiratory: Positive for cough, shortness of breath and wheezing. Negative for hemoptysis.   Cardiovascular: Positive for leg swelling. Negative for chest pain and syncope.  Gastrointestinal: Negative for vomiting and abdominal pain.  Neurological: Negative for syncope.  All other systems reviewed and are negative.      Allergies  Codeine  Home Medications   Current Outpatient Rx  Name  Route  Sig  Dispense  Refill  . acetaminophen (TYLENOL) 500 MG tablet   Oral   Take 500 mg by mouth every 6 (six) hours as needed for pain.         [name] amLODipine-atorvastatin (CADUET) 5-20 MG per tablet   Oral   Take 1 tablet by mouth at bedtime.         [name] aspirin 81 MG tablet   Oral   Take 81 mg by mouth every morning.          . Calcium Carbonate-Vitamin D (CALCIUM 600 + D PO)   Oral   Take 1 tablet by  mouth 2 (two) times daily.         . Cholecalciferol (VITAMIN D) 2000 UNITS CAPS   Oral   Take 1 capsule by mouth at bedtime.          . folic acid (FOLVITE) 1 MG tablet   Oral   Take 1 mg by mouth 2 (two) times daily.          . furosemide (LASIX) 20 MG tablet   Oral   Take 1 tablet (20 mg total) by mouth 2 (two) times daily.   60 tablet   12   . losartan-hydrochlorothiazide (HYZAAR) 100-12.5 MG per tablet   Oral   Take 1 tablet by mouth every morning.          . metoprolol (TOPROL-XL) 200 MG 24 hr tablet   Oral   Take 200 mg by mouth every morning.          Vladimir Faster Glycol-Propyl Glycol (SYSTANE OP)   Both Eyes   Place 1 drop into both eyes daily as needed (Dry eyes).         . potassium chloride SA (K-DUR,KLOR-CON) 20 MEQ tablet   Oral   Take 1 tablet (20 mEq total) by mouth 3 (three) times daily.   30 tablet   11   . warfarin (COUMADIN) 2.5 MG tablet      TAKE 2  TABLETS BY MOUTH ONCE DAILY, EXCEPT TAKE 1 TABLET ON WEDNESDAYAND SUNDAY.   48 tablet   3    BP 153/78  Pulse 91  Temp(Src) 97.8 F (36.6 C) (Oral)  Resp 30  Ht 5\' 1"  (1.549 m)  Wt 160 lb (72.576 kg)  BMI 30.25 kg/m2  SpO2 95% Physical Exam CONSTITUTIONAL: Well developed/well nourished HEAD: Normocephalic/atraumatic EYES: EOMI/PERRL ENMT: Mucous membranes moist NECK: supple no meningeal signs SPINE:entire spine nontender CV: S1/S2 noted LUNGS:wheezing bilaterally, mild tachypnea noted.  Pt is able to speak to me clearly ABDOMEN: soft, nontender, no rebound or guarding GU:no cva tenderness NEURO: Pt is awake/alert, moves all extremitiesx4 EXTREMITIES: pulses normal, full ROM. Minimal pitting edema in bilateral LE SKIN: warm, color normal PSYCH: no abnormalities of mood noted  ED Course  Procedures   7:34 AM Pt improving on my arrival to room, not requiring CPAP by the time of my evaluation Labs/imaging ordered.  Nebs ordered for wheezing.  Will follow closely 8:44 AM cxr suspicious for pneumonia Will admit Pt stabilized in the ED Will give her home lasix  Labs Review Labs Reviewed  BASIC METABOLIC PANEL - Abnormal; Notable for the following:    Glucose, Bld 176 (*)    BUN 28 (*)    Creatinine, Ser 1.22 (*)    GFR calc non Af Amer 41 (*)    GFR calc Af Amer 48 (*)    All other components within normal limits  CBC WITH DIFFERENTIAL - Abnormal; Notable for the following:    WBC 11.4 (*)    Neutro Abs 8.8 (*)    All other components within normal limits  PRO B NATRIURETIC PEPTIDE - Abnormal; Notable for the following:    Pro B Natriuretic peptide (BNP) 2488.0 (*)    All other components within normal limits  PROTIME-INR - Abnormal; Notable for the following:    Prothrombin Time 22.8 (*)    INR 2.09 (*)    All other components within normal limits  I-STAT TROPOININ, ED   Imaging Review Dg Chest Portable 1 View  07/19/2013   CLINICAL DATA:  Respiratory  distress  EXAM: PORTABLE CHEST - 1 VIEW  COMPARISON:  04/11/2013  FINDINGS: Cardiomegaly again noted. Three leads cardiac pacemaker is unchanged in position. Status post CABG. Hazy right basilar atelectasis or infiltrate. No pulmonary edema.  IMPRESSION: Cardiomegaly. Stable cardiac pacemaker position. No pulmonary edema. Hazy right basilar atelectasis or infiltrate.   Electronically Signed   By: Lahoma Crocker M.D.   On: 07/19/2013 07:43     EKG Interpretation   Date/Time:  Wednesday July 19 2013 07:01:32 EDT Ventricular Rate:  89 PR Interval:  156 QRS Duration: 180 QT Interval:  434 QTC Calculation: 528 R Axis:   -89 Text Interpretation:  Atrial-sensed ventricular-paced rhythm with  occasional Premature ventricular complexes Abnormal ECG When compared with  ECG of 11-Apr-2013 19:34, Premature ventricular complexes are now Present  Vent. rate has increased BY  19 BPM Confirmed by Christy Gentles  MD, [place]  [phone]) on 07/19/2013 7:13:04 AM      MDM   Final diagnoses:  CAP (community acquired pneumonia)    Nursing notes including past medical history and social history reviewed and considered in documentation xrays reviewed and considered Labs/vital reviewed and considered Previous records reviewed and considered - h/o CHF, has ICD in place     Sharyon Cable, MD 07/19/13 [phone]

## 2013-07-19 NOTE — H&P (Signed)
[facility] History and Physical  [name] [name] BSJ:[id number] DOB: Aug 30, 1931 DOA: 07/19/2013  Referring physician: ED physician PCP: Alonza Bogus, MD   Chief Complaint: shortness of breath    HPI:  Pt is 80 yo female with history of COPD, grade 2 diastolic CHF, atrial fibrillation and on Coumadin, presenting to [facility] ED with main concern of several days duration of progressively worsening shortness of breath, present with exertion and at rest, associated with subjective fevers, chills, productive cough of yellow sputum, poor oral intake. Pt denies chest pain other than the pain present with coughing spells, also denies recent sick contacts or exposures, no abdominal or urinary concerns.   In ED, pt noted to be hypoxic with oxygen saturation in 80's, requiring BiPAP initially and with ABX Zithro/Rocephin, BD's responded well and successfully transitioned to [place]. TR[facility] asked to admit to telemetry bed for further evaluation and management of presumptive PNA and CHF.   Assessment and Plan: Active Problems: Acute hypoxic respiratory failure - this appears to be multifactorial and secondary to CAP with mild CHF exacerbation - will place on ABX Rocephin and Zithromax, sputum analysis ordered - also place on IV Lasix, monitor I's/O's and daily weights, 2 D ECHO - oxygen as needed, solumedrol daily for wheezing, BD's scheduled and as needed CAP - management with ABX as noted above - follow up on sputum analysis  Diastolic CHF, acute on chronic, grade 2 - lasix IV, follow up on 2 D ECHO - weight on admission 162 lbs  Atrial fibrillation - continue metoprolol, coumadin per pharmacy  Acute renal failure - close monitoring of renal function as now on IV Lasix  - repeat BMP in AM Leukocytosis - likely secondary to PNA - ABX as noted above and repeat CBC in AM HTN - stable on admission - continue home medical regimen   Radiological Exams on Admission: Dg Chest Portable 1 View    07/19/2013 Cardiomegaly. Stable cardiac pacemaker position. No pulmonary edema. Hazy right basilar atelectasis or infiltrate.     Code Status: Full Family Communication: Pt at bedside Disposition Plan: Admit for further evaluation    Review of Systems:  Constitutional: Negative for diaphoresis.  HENT: Negative for hearing loss, ear pain, nosebleeds, congestion, sore throat, neck pain, tinnitus and ear discharge.   Eyes: Negative for blurred vision, double vision, photophobia, pain, discharge and redness.  Respiratory: Per HPI   Cardiovascular: Per HPI Gastrointestinal: Negative for heartburn, constipation, blood in stool and melena.  Genitourinary: Negative for dysuria, urgency, frequency, hematuria and flank pain.  Musculoskeletal: Negative for myalgias, back pain, joint pain and falls.  Skin: Negative for itching and rash.  Neurological: Negative for tingling, tremors, sensory change, speech change, focal weakness, loss of consciousness and headaches.  Endo/Heme/Allergies: Negative for environmental allergies and polydipsia. Does not bruise/bleed easily.  Psychiatric/Behavioral: Negative for suicidal ideas. The patient is not nervous/anxious.      Past Medical History  Diagnosis Date  . Osteoarthritis   . Mixed hyperlipidemia   . Coronary atherosclerosis of native coronary artery     Multivessel status post CABG, DES PLA March 2006  . Essential hypertension, benign   . Heart block   . Syncope   . Ventricular tachycardia     Status post ICD  . Ischemic cardiomyopathy     LVEF 55% as of March 2014  . Atrial fibrillation     Past Surgical History  Procedure Laterality Date  . Coronary artery bypass graft      LIMA  to LAD, SVG to diagonal, SVG to ramus and OM  . Cardiac defibrillator placement      Guidant device  . Yag laser application Left 05/25/5807    Procedure: YAG LASER APPLICATION;  Surgeon: Elta Guadeloupe T. Gershon Crane, MD;  Location: [facility];  Service: Ophthalmology;  Laterality:  Left;  . Yag laser application Right 9/83/3825    Procedure: YAG LASER APPLICATION;  Surgeon: Elta Guadeloupe T. Gershon Crane, MD;  Location: [facility];  Service: Ophthalmology;  Laterality: Right;    Social History:  reports that she has never smoked. She has never used smokeless tobacco. She reports that she does not drink alcohol or use illicit drugs.  Allergies  Allergen Reactions  . Codeine Nausea And Vomiting    Family History  Problem Relation Age of Onset  . Heart attack Father   . Heart attack Brother     Prior to Admission medications   Medication Sig Start Date End Date Taking? Authorizing Provider  acetaminophen (TYLENOL) 500 MG tablet Take 500 mg by mouth every 6 (six) hours as needed for pain.   Yes Historical Provider, MD  amLODipine-atorvastatin (CADUET) 5-20 MG per tablet Take 1 tablet by mouth at bedtime.   Yes Historical Provider, MD  aspirin EC 81 MG tablet Take 81 mg by mouth daily.   Yes Historical Provider, MD  Calcium Carbonate-Vitamin D (CALCIUM 600 + D PO) Take 1 tablet by mouth 2 (two) times daily.   Yes Historical Provider, MD  Cholecalciferol (VITAMIN D) 2000 UNITS CAPS Take 1 capsule by mouth at bedtime.    Yes Historical Provider, MD  folic acid (FOLVITE) 1 MG tablet Take 1 mg by mouth 2 (two) times daily.    Yes Historical Provider, MD  furosemide (LASIX) 20 MG tablet Take 1 tablet (20 mg total) by mouth 2 (two) times daily. 04/13/13  Yes Alonza Bogus, MD  losartan-hydrochlorothiazide (HYZAAR) 100-12.5 MG per tablet Take 1 tablet by mouth every morning.  03/29/13  Yes Historical Provider, MD  metoprolol (TOPROL-XL) 200 MG 24 hr tablet Take 200 mg by mouth every morning.    Yes Historical Provider, MD  Polyethyl Glycol-Propyl Glycol (SYSTANE OP) Place 1 drop into both eyes daily as needed (Dry eyes).   Yes Historical Provider, MD  potassium chloride SA (K-DUR,KLOR-CON) 20 MEQ tablet Take 1 tablet (20 mEq total) by mouth 3 (three) times daily. 04/13/13 04/13/14 Yes Alonza Bogus, MD  warfarin (COUMADIN) 2.5 MG tablet Take 2.5-5 mg by mouth daily. Patient takes 1 tablet(2.5mg ) on Mon.,Wed.,Fri., and 2 tablets all other days   Yes Historical Provider, MD    Physical Exam: Filed Vitals:   07/19/13 0539 07/19/13 0905 07/19/13 0934 07/19/13 0959  BP:  107/83 133/63 133/66  Pulse:  91 78 78  Temp:   98.5 F (36.9 C) 98.5 F (36.9 C)  TempSrc:   Oral Oral  Resp:  21 20   Height:   5\' 1"  (1.549 m) 5\' 1"  (1.549 m)  Weight: 73.71 kg (162 lb 8 oz)  73.846 kg (162 lb 12.8 oz) 73.8 kg (162 lb 11.2 oz)  SpO2:  96% 96% 96%    Physical Exam  Constitutional: Appears well-developed and well-nourished. No distress.  HENT: Normocephalic. External right and left ear normal. Oropharynx is clear and moist.  Eyes: Conjunctivae and EOM are normal. PERRLA, no scleral icterus.  Neck: Normal ROM. Neck supple. No JVD. No tracheal deviation. No thyromegaly.  CVS: RRR, S1/S2 +, no gallops, no carotid bruit.  Pulmonary:  Somewhat course breath sounds bilaterally with mild end expiratory wheezing  Abdominal: Soft. BS +,  no distension, tenderness, rebound or guarding.  Musculoskeletal: Normal range of motion. +1 bilateral LE pitting edema  Lymphadenopathy: No lymphadenopathy noted, cervical, inguinal. Neuro: Alert. Normal reflexes, muscle tone coordination. No cranial nerve deficit. Skin: Skin is warm and dry. No rash noted. Not diaphoretic. No erythema. No pallor.  Psychiatric: Normal mood and affect. Behavior, judgment, thought content normal.   Labs on Admission:  Basic Metabolic Panel:  Recent Labs Lab 07/19/13 0724  NA 143  K 3.9  CL 102  CO2 27  GLUCOSE 176*  BUN 28*  CREATININE 1.22*  CALCIUM 9.7   CBC:  Recent Labs Lab 07/19/13 0724  WBC 11.4*  NEUTROABS 8.8*  HGB 13.4  HCT 39.9  MCV 93.2  PLT 166    EKG: Normal sinus rhythm, no ST/T wave changes  Faye Ramsay, MD  [facility] Pager [phone]  If 7PM-7AM, please contact  night-coverage [email or web address] Password [email or web address] 07/19/2013, 10:16 AM

## 2013-07-20 DIAGNOSIS — I509 Heart failure, unspecified: Secondary | ICD-10-CM

## 2013-07-20 DIAGNOSIS — I5033 Acute on chronic diastolic (congestive) heart failure: Secondary | ICD-10-CM

## 2013-07-20 LAB — LEGIONELLA ANTIGEN, URINE: LEGIONELLA ANTIGEN, URINE: NEGATIVE

## 2013-07-20 LAB — RESPIRATORY VIRUS PANEL
Adenovirus: NOT DETECTED
INFLUENZA A: NOT DETECTED
INFLUENZA B 1: NOT DETECTED
Influenza A H1: NOT DETECTED
Influenza A H3: NOT DETECTED
Metapneumovirus: NOT DETECTED
PARAINFLUENZA 1 A: NOT DETECTED
PARAINFLUENZA 2 A: NOT DETECTED
PARAINFLUENZA 3 A: DETECTED — AB
Respiratory Syncytial Virus A: NOT DETECTED
Respiratory Syncytial Virus B: NOT DETECTED
Rhinovirus: NOT DETECTED

## 2013-07-20 LAB — CBC
HEMATOCRIT: 38.3 % (ref 36.0–46.0)
HEMOGLOBIN: 13 g/dL (ref 12.0–15.0)
MCH: 31.5 pg (ref 26.0–34.0)
MCHC: 33.9 g/dL (ref 30.0–36.0)
MCV: 92.7 fL (ref 78.0–100.0)
Platelets: 177 10*3/uL (ref 150–400)
RBC: 4.13 MIL/uL (ref 3.87–5.11)
RDW: 13.9 % (ref 11.5–15.5)
WBC: 10.3 10*3/uL (ref 4.0–10.5)

## 2013-07-20 LAB — BASIC METABOLIC PANEL
BUN: 39 mg/dL — ABNORMAL HIGH (ref 6–23)
CO2: 28 meq/L (ref 19–32)
Calcium: 9.2 mg/dL (ref 8.4–10.5)
Chloride: 103 mEq/L (ref 96–112)
Creatinine, Ser: 1.32 mg/dL — ABNORMAL HIGH (ref 0.50–1.10)
GFR calc Af Amer: 43 mL/min — ABNORMAL LOW (ref >90)
GFR calc non Af Amer: 37 mL/min — ABNORMAL LOW (ref >90)
Glucose, Bld: 132 mg/dL — ABNORMAL HIGH (ref 70–99)
POTASSIUM: 4.1 meq/L (ref 3.7–5.3)
SODIUM: 144 meq/L (ref 137–147)

## 2013-07-20 LAB — PROTIME-INR
INR: 2.08 — ABNORMAL HIGH (ref 0.00–1.49)
PROTHROMBIN TIME: 22.7 s — AB (ref 11.6–15.2)

## 2013-07-20 LAB — TSH: TSH: 3.26 u[IU]/mL (ref 0.350–4.500)

## 2013-07-20 MED ORDER — METHYLPREDNISOLONE SODIUM SUCC 40 MG IJ SOLR
40.0000 mg | Freq: Two times a day (BID) | INTRAMUSCULAR | Status: DC
  Administered 2013-07-20 – 2013-07-22 (×4): 40 mg via INTRAVENOUS
  Filled 2013-07-20 (×4): qty 1

## 2013-07-20 MED ORDER — WARFARIN SODIUM 5 MG PO TABS
5.0000 mg | ORAL_TABLET | Freq: Once | ORAL | Status: AC
  Administered 2013-07-20: 5 mg via ORAL
  Filled 2013-07-20: qty 1

## 2013-07-20 NOTE — Progress Notes (Signed)
Patient ID: [name] [name], female   DOB: [birth year], 80 y.o.   MRN: [id number]  [facility] PROGRESS NOTE  [name] [name] EXB:[id number] DOB: March 08, 1932 DOA: 07/19/2013 PCP: Alonza Bogus, MD  Brief narrative: Pt is 80 yo female with history of COPD, grade 2 diastolic CHF, atrial fibrillation and on Coumadin, presented to [facility] ED with main concern of several days duration of progressively worsening shortness of breath, present with exertion and at rest, associated with subjective fevers, chills, productive cough of yellow sputum, poor oral intake. Pt denies chest pain other than the pain present with coughing spells, also denies recent sick contacts or exposures, no abdominal or urinary concerns.  In ED, pt noted to be hypoxic with oxygen saturation in 80's, requiring BiPAP initially and successfully transitioned to [place] after treatment with ABX Zithro/Rocephin, BD's. TRH asked to admit to telemetry bed for further evaluation and management of presumptive PNA and mild CHF.   Assessment and Plan:  Active Problems:  Acute hypoxic respiratory failure  - this appears to be multifactorial and secondary to CAP with mild CHF exacerbation  - pt is clinically improving and reports feeling better but still with mild expiratory wheezing on exam this AM  - sputum culture and urine legionella pending, strep pneumo ag is negative - respiratory virus panel pending  - oxygen via [place], solumedrol daily for wheezing with planned tapering as clinically indicated, BD's (bronchodilators) scheduled and as needed  CAP  - management with ABX,  as noted above  - follow up on sputum analysis and respiratory virus panel  Diastolic CHF, acute on chronic, grade 2  - lasix IV 40 mg given on admission, pt appears euvolemic this AM but weight is up from admission (162 --> 166 lbs this AM) - appreciate cardiology input, will follow up on recommendations - change to PO Lasix as per home medical regimen - discontinue telemetry  as no events noted over the past 24 hours  Atrial fibrillation  - continue metoprolol, coumadin per pharmacy  - rate controlled since admission  Acute renal failure  - Cr is slightly up after IV Lasix - plan on change to PO Lasix today and repeat BMP in AM Leukocytosis  - likely secondary to PNA - WBC trending down, continue ABX as noted above  HTN  - reasonable inpatient control  - continue Norvasc, Losartan, Metoprolol HLD - continue statin   Radiological Exams on Admission:   Dg Chest Portable 1 View 07/19/2013 Cardiomegaly. Stable cardiac pacemaker position. No pulmonary edema. Hazy right basilar atelectasis or infiltrate.   Consultants:  Cardiology  Antibiotics:  Zithromax 4/1 -->  Rocephin 4/1 -->  Code Status: Full Family Communication: Pt at bedside Disposition Plan: Home when medically stable, likely in 24 - 48 hours   HPI/Subjective: No events overnight.   Objective: Filed Vitals:   07/19/13 2318 07/20/13 0428 07/20/13 0524 07/20/13 0706  BP:   118/51   Pulse:   68   Temp:   98.6 F (37 C)   TempSrc:   Oral   Resp:   20   Height:      Weight:   75.479 kg (166 lb 6.4 oz)   SpO2: 96% 95% 97% 95%    Intake/Output Summary (Last 24 hours) at 07/20/13 1028 Last data filed at 07/20/13 0800  Gross per 24 hour  Intake    480 ml  Output   1100 ml  Net   -620 ml    Exam:   General:  Pt is alert, follows commands appropriately, not in acute distress  Cardiovascular: Regular rate and rhythm, S1/S2, no murmurs, no rubs, no gallops  Respiratory: Clear to auscultation bilaterally with mild end expiratory wheezing bilaterally, no crackles on exam   Abdomen: Soft, non tender, non distended, bowel sounds present, no guarding  Extremities: No edema, pulses DP and PT palpable bilaterally  Neuro: Grossly nonfocal  Data Reviewed: Basic Metabolic Panel:  Recent Labs Lab 07/19/13 0724 07/19/13 0734 07/20/13 0600  NA 143  --  144  K 3.9  --  4.1  CL  102  --  103  CO2 27  --  28  GLUCOSE 176*  --  132*  BUN 28*  --  39*  CREATININE 1.22*  --  1.32*  CALCIUM 9.7  --  9.2  MG  --  2.0  --   PHOS  --  3.7  --    CBC:  Recent Labs Lab 07/19/13 0724 07/20/13 0600  WBC 11.4* 10.3  NEUTROABS 8.8*  --   HGB 13.4 13.0  HCT 39.9 38.3  MCV 93.2 92.7  PLT 166 177   Recent Results (from the past 240 hour(s))  CULTURE, EXPECTORATED SPUTUM-ASSESSMENT     Status: None   Collection Time    07/19/13  8:23 PM      Result Value Ref Range Status   Specimen Description SPUTUM   Final   Special Requests NONE   Final   Sputum evaluation     Final   Value: THIS SPECIMEN IS ACCEPTABLE. RESPIRATORY CULTURE REPORT TO FOLLOW.   Report Status 07/19/2013 FINAL   Final     Scheduled Meds: . amLODipine  5 mg Oral QHS   And  . atorvastatin  20 mg Oral QHS  . aspirin EC  81 mg Oral Daily  . azithromycin  500 mg Intravenous Q24H  . cefTRIAXone (ROCEPHIN)  IV  1 g Intravenous Q24H  . folic acid  1 mg Oral BID  . levalbuterol  0.63 mg Nebulization TID PC & HS  . losartan  100 mg Oral Daily  . methylPREDNISolone (SOLU-MEDROL) injection  40 mg Intravenous Daily  . metoprolol  200 mg Oral q morning - 10a  . potassium chloride SA  20 mEq Oral TID  . sodium chloride  3 mL Intravenous Q12H  . Warfarin - Pharmacist Dosing Inpatient   Does not apply Q24H   Continuous Infusions:   Faye Ramsay, MD  [facility] Pager [phone]  If 7PM-7AM, please contact night-coverage [email or web address] Password [email or web address] 07/20/2013, 10:28 AM   LOS: 1 day

## 2013-07-20 NOTE — Progress Notes (Signed)
[place] for Coumadin Indication: atrial fibrillation  Allergies  Allergen Reactions  . Codeine Nausea And Vomiting    Patient Measurements: Height: 5\' 1"  (154.9 cm) Weight: 166 lb 6.4 oz (75.479 kg) IBW/kg (Calculated) : 47.8  Vital Signs: Temp: 98.6 F (37 C) (04/02 0524) Temp src: Oral (04/02 0524) BP: 118/51 mmHg (04/02 0524) Pulse Rate: 68 (04/02 0524)  Labs:  Recent Labs  07/19/13 0724 07/20/13 0600  HGB 13.4 13.0  HCT 39.9 38.3  PLT 166 177  LABPROT 22.8* 22.7*  INR 2.09* 2.08*  CREATININE 1.22* 1.32*    Estimated Creatinine Clearance: 32.1 ml/min (by C-G formula based on Cr of 1.32).   Medical History: Past Medical History  Diagnosis Date  . Osteoarthritis   . Mixed hyperlipidemia   . Coronary atherosclerosis of native coronary artery     Multivessel status post CABG, DES PLA March 2006  . Essential hypertension, benign   . Heart block   . Syncope   . Ventricular tachycardia     Status post ICD  . Ischemic cardiomyopathy     LVEF 55% as of March 2014  . Atrial fibrillation     Medications:  Prescriptions prior to admission  Medication Sig Dispense Refill  . acetaminophen (TYLENOL) 500 MG tablet Take 500 mg by mouth every 6 (six) hours as needed for pain.      [name] amLODipine-atorvastatin (CADUET) 5-20 MG per tablet Take 1 tablet by mouth at bedtime.      [name] aspirin EC 81 MG tablet Take 81 mg by mouth daily.      . Calcium Carbonate-Vitamin D (CALCIUM 600 + D PO) Take 1 tablet by mouth 2 (two) times daily.      . Cholecalciferol (VITAMIN D) 2000 UNITS CAPS Take 1 capsule by mouth at bedtime.       . folic acid (FOLVITE) 1 MG tablet Take 1 mg by mouth 2 (two) times daily.       . furosemide (LASIX) 20 MG tablet Take 1 tablet (20 mg total) by mouth 2 (two) times daily.  60 tablet  12  . losartan-hydrochlorothiazide (HYZAAR) 100-12.5 MG per tablet Take 1 tablet by mouth every morning.       . metoprolol  (TOPROL-XL) 200 MG 24 hr tablet Take 200 mg by mouth every morning.       Vladimir Faster Glycol-Propyl Glycol (SYSTANE OP) Place 1 drop into both eyes daily as needed (Dry eyes).      . potassium chloride SA (K-DUR,KLOR-CON) 20 MEQ tablet Take 1 tablet (20 mEq total) by mouth 3 (three) times daily.  30 tablet  11  . warfarin (COUMADIN) 2.5 MG tablet Take 2.5-5 mg by mouth daily. Patient takes 1 tablet(2.5mg ) on Mon.,Wed.,Fri., and 2 tablets all other days        Assessment: 80 yo F on chronic Coumadin for Afib.  Home dose listed above.  INR therapeutic.   She was admitted with CAP & started on Rocephin & Zithromax which may interact to cause elevated INR.  No bleeding noted.   Goal of Therapy:  INR 2-3   Plan:  Coumadin 5mg  po x1 today. Daily INR  Pricilla Larsson 07/20/2013,11:45 AM

## 2013-07-20 NOTE — Progress Notes (Signed)
Patient ID: [name] [name], female   DOB: May 19, 1931, 80 y.o.   MRN: [id number]      Subjective:   SOB improving, still with wheezing and cough overnight.    Objective:   Temp:  [98.5 F (36.9 C)-98.7 F (37.1 C)] 98.6 F (37 C) (04/02 0524) Pulse Rate:  [68-91] 68 (04/02 0524) Resp:  [20-22] 20 (04/02 0524) BP: (107-133)/(47-83) 118/51 mmHg (04/02 0524) SpO2:  [95 %-97 %] 95 % (04/02 0706) Weight:  [162 lb 11.2 oz (73.8 kg)-166 lb 6.4 oz (75.479 kg)] 166 lb 6.4 oz (75.479 kg) (04/02 0524) Last BM Date: 07/18/13  Filed Weights   07/19/13 0934 07/19/13 0959 07/20/13 0524  Weight: 162 lb 12.8 oz (73.846 kg) 162 lb 11.2 oz (73.8 kg) 166 lb 6.4 oz (75.479 kg)    Intake/Output Summary (Last 24 hours) at 07/20/13 0817 Last data filed at 07/20/13 0525  Gross per 24 hour  Intake    240 ml  Output   1550 ml  Net  -1310 ml    Telemetry: AV paced.   Exam:  General: NAD  Resp: bilateral expiratory wheezing  Cardiac: RRR, no m/r/g, no JVD  GI: abdomen soft, NT, ND  MSK: no LE edema  Neuro: no focal deficits  Psych: appropriate affect  Lab Results:  Basic Metabolic Panel:  Recent Labs Lab 07/19/13 0724 07/19/13 0734 07/20/13 0600  NA 143  --  144  K 3.9  --  4.1  CL 102  --  103  CO2 27  --  28  GLUCOSE 176*  --  132*  BUN 28*  --  39*  CREATININE 1.22*  --  1.32*  CALCIUM 9.7  --  9.2  MG  --  2.0  --     Liver Function Tests: No results found for this basename: AST, ALT, ALKPHOS, BILITOT, PROT, ALBUMIN,  in the last 168 hours  CBC:  Recent Labs Lab 07/19/13 0724 07/20/13 0600  WBC 11.4* 10.3  HGB 13.4 13.0  HCT 39.9 38.3  MCV 93.2 92.7  PLT 166 177    Cardiac Enzymes: No results found for this basename: CKTOTAL, CKMB, CKMBINDEX, TROPONINI,  in the last 168 hours  BNP:  Recent Labs  04/11/13 1940 04/14/13 1046 07/19/13 0724  PROBNP 4599.0* 780.0* 2488.0*    Coagulation:  Recent Labs Lab 07/19/13 0724 07/20/13 0600  INR 2.09*  2.08*    ECG:   Medications:   Scheduled Medications: . amLODipine  5 mg Oral QHS   And  . atorvastatin  20 mg Oral QHS  . aspirin EC  81 mg Oral Daily  . azithromycin  500 mg Intravenous Q24H  . cefTRIAXone (ROCEPHIN)  IV  1 g Intravenous Q24H  . folic acid  1 mg Oral BID  . losartan  100 mg Oral Daily   And  . hydrochlorothiazide  12.5 mg Oral Daily  . levalbuterol  0.63 mg Nebulization TID PC & HS  . methylPREDNISolone (SOLU-MEDROL) injection  40 mg Intravenous Daily  . metoprolol  200 mg Oral q morning - 10a  . potassium chloride SA  20 mEq Oral TID  . sodium chloride  3 mL Intravenous Q12H  . Warfarin - Pharmacist Dosing Inpatient   Does not apply Q24H     Infusions:     PRN Medications:  sodium chloride, acetaminophen, guaiFENesin-dextromethorphan, HYDROcodone-acetaminophen, levalbuterol, ondansetron (ZOFRAN) IV, ondansetron, sodium chloride     Assessment/Plan    1. CAP  - initially very symptomatic  on bipap, weaned rather quickly to [facility] and now back on room air  - abx per primary team   2. Acute on chronic diastolic heart failure  - likely mild overload, exacerbating factor likely her infection. CXR, physical exam, and stable weight do not support severe fluid accumulation.  - negative 1 liter yesterday, Cr and BUN trending up suggesting she is intravascularly depleted - IV lasix has been stopped, will hold oral lasix today and resume her regular home regimen tomorrow.  Will hold HCTZ as well today.  - recent echo Jan 2015, I do not see a strong indication to repeat now that she has improved so quickly with treatment. Will cancel repeat echo   3. CAD  - no symptoms  - continue current meds        Carlyle Dolly, M.D., F.A.C.C.

## 2013-07-20 NOTE — Care Management Note (Addendum)
    Page 1 of 1   07/21/2013     2:34:41 PM   CARE MANAGEMENT NOTE 07/21/2013  Patient:  [name] [name], [name] [name]   Account Number:  [id number]  Date Initiated:  07/20/2013  Documentation initiated by:  Theophilus Kinds  Subjective/Objective Assessment:   Pt admitted from home with pneumonia. Pt lives alone and will return home at discharge. Pt is independent with ADL's.     Action/Plan:   No CM needs noted.   Anticipated DC Date:  07/23/2013   Anticipated DC Plan:  [place]  CM consult      Choice offered to / List presented to:             Status of service:  Completed, signed off Medicare Important Message given?  YES (If response is "NO", the following Medicare IM given date fields will be blank) Date Medicare IM given:  07/21/2013 Date Additional Medicare IM given:    Discharge Disposition:  HOME/SELF CARE  Per UR Regulation:    If discussed at Long Length of Stay Meetings, dates discussed:    Comments:  07/21/13 [place], RN BSN CM Pt potential discharge 07/22/13. Bedside RN to ambulate pt to assess need for home O2. If pt qualifies will arrange with pts choice of agency. No other CM needs noted.  07/20/13 [place], RN BSN CM

## 2013-07-20 NOTE — Progress Notes (Signed)
Patient O2 checked on room air while sitting bed talking on the phone; 94% with some labored breathing noted. Patient was placed back on her 3lpm [place] and will attempt to possibly walk her to see what her O2 level will be.

## 2013-07-21 DIAGNOSIS — I10 Essential (primary) hypertension: Secondary | ICD-10-CM

## 2013-07-21 DIAGNOSIS — I2510 Atherosclerotic heart disease of native coronary artery without angina pectoris: Secondary | ICD-10-CM

## 2013-07-21 LAB — BASIC METABOLIC PANEL
BUN: 49 mg/dL — AB (ref 6–23)
CALCIUM: 9.2 mg/dL (ref 8.4–10.5)
CHLORIDE: 105 meq/L (ref 96–112)
CO2: 23 mEq/L (ref 19–32)
CREATININE: 1.09 mg/dL (ref 0.50–1.10)
GFR calc non Af Amer: 47 mL/min — ABNORMAL LOW (ref >90)
GFR, EST AFRICAN AMERICAN: 54 mL/min — AB (ref >90)
Glucose, Bld: 157 mg/dL — ABNORMAL HIGH (ref 70–99)
Potassium: 4.5 mEq/L (ref 3.7–5.3)
Sodium: 143 mEq/L (ref 137–147)

## 2013-07-21 LAB — PROTIME-INR
INR: 2.04 — AB (ref 0.00–1.49)
PROTHROMBIN TIME: 22.4 s — AB (ref 11.6–15.2)

## 2013-07-21 MED ORDER — FUROSEMIDE 10 MG/ML IJ SOLN
40.0000 mg | Freq: Once | INTRAMUSCULAR | Status: AC
  Administered 2013-07-21: 40 mg via INTRAVENOUS
  Filled 2013-07-21: qty 4

## 2013-07-21 MED ORDER — WARFARIN SODIUM 2.5 MG PO TABS
2.5000 mg | ORAL_TABLET | Freq: Once | ORAL | Status: AC
  Administered 2013-07-21: 2.5 mg via ORAL
  Filled 2013-07-21: qty 1

## 2013-07-21 NOTE — Progress Notes (Signed)
Subjective: Mild SOB  No CP Objective: Filed Vitals:   07/20/13 2051 07/21/13 0447 07/21/13 0735 07/21/13 0827  BP: 101/64 128/70    Pulse: 62 65    Temp: 98.7 F (37.1 C) 97.7 F (36.5 C)    TempSrc: Oral Oral    Resp: 20 20    Height:      Weight:  164 lb 6.4 oz (74.571 kg)    SpO2: 98% 99% 98% 94%   Weight change: 4 lb 6.4 oz (1.996 kg)  Intake/Output Summary (Last 24 hours) at 07/21/13 0843 Last data filed at 07/21/13 0400  Gross per 24 hour  Intake   1380 ml  Output   1300 ml  Net     80 ml   Net since admit:  -750 ml    General: Alert, awake, oriented x3, in no acute distress Neck:  JVP is mildly increased Heart: Regular rate and rhythm, without murmurs, rubs, gallops.  Lungs: Mild wheezes. Mild decreased airflow Exemities:  No edema.   Neuro: Grossly intact, nonfocal.   Lab Results: Results for orders placed during the hospital encounter of 07/19/13 (from the past 24 hour(s))  PROTIME-INR     Status: Abnormal   Collection Time    07/21/13  5:56 AM      Result Value Ref Range   Prothrombin Time 22.4 (*) 11.6 - 15.2 seconds   INR 2.04 (*) 0.00 - 7.86  BASIC METABOLIC PANEL     Status: Abnormal   Collection Time    07/21/13  5:56 AM      Result Value Ref Range   Sodium 143  137 - 147 mEq/L   Potassium 4.5  3.7 - 5.3 mEq/L   Chloride 105  96 - 112 mEq/L   CO2 23  19 - 32 mEq/L   Glucose, Bld 157 (*) 70 - 99 mg/dL   BUN 49 (*) 6 - 23 mg/dL   Creatinine, Ser 1.09  0.50 - 1.10 mg/dL   Calcium 9.2  8.4 - 10.5 mg/dL   GFR calc non Af Amer 47 (*) >90 mL/min   GFR calc Af Amer 54 (*) >90 mL/min    Studies/Results: No results found.  Medications: Reviewed   @PROBHOSP @  1.  Acute on chronic diastolic CHF  Improved from admit.  I would recomm 1 dose lasix IV  Follow I/O  Labs in AM    2.  CAP  ON ABX  3.  CAD  No symptoms to sugg angina.    LOS: 2 days   Dorris Carnes 07/21/2013, 8:43 AM

## 2013-07-21 NOTE — Progress Notes (Signed)
[place] for Coumadin Indication: atrial fibrillation  Allergies  Allergen Reactions  . Codeine Nausea And Vomiting    Patient Measurements: Height: 5\' 1"  (154.9 cm) Weight: 164 lb 6.4 oz (74.571 kg) IBW/kg (Calculated) : 47.8  Vital Signs: Temp: 97.7 F (36.5 C) (04/03 0447) Temp src: Oral (04/03 0447) BP: 128/70 mmHg (04/03 0447) Pulse Rate: 65 (04/03 0447)  Labs:  Recent Labs  07/19/13 0724 07/20/13 0600 07/21/13 0556  HGB 13.4 13.0  --   HCT 39.9 38.3  --   PLT 166 177  --   LABPROT 22.8* 22.7* 22.4*  INR 2.09* 2.08* 2.04*  CREATININE 1.22* 1.32* 1.09    Estimated Creatinine Clearance: 38.6 ml/min (by C-G formula based on Cr of 1.09).   Medical History: Past Medical History  Diagnosis Date  . Osteoarthritis   . Mixed hyperlipidemia   . Coronary atherosclerosis of native coronary artery     Multivessel status post CABG, DES PLA March 2006  . Essential hypertension, benign   . Heart block   . Syncope   . Ventricular tachycardia     Status post ICD  . Ischemic cardiomyopathy     LVEF 55% as of March 2014  . Atrial fibrillation     Medications:  Prescriptions prior to admission  Medication Sig Dispense Refill  . acetaminophen (TYLENOL) 500 MG tablet Take 500 mg by mouth every 6 (six) hours as needed for pain.      [name] amLODipine-atorvastatin (CADUET) 5-20 MG per tablet Take 1 tablet by mouth at bedtime.      [name] aspirin EC 81 MG tablet Take 81 mg by mouth daily.      . Calcium Carbonate-Vitamin D (CALCIUM 600 + D PO) Take 1 tablet by mouth 2 (two) times daily.      . Cholecalciferol (VITAMIN D) 2000 UNITS CAPS Take 1 capsule by mouth at bedtime.       . folic acid (FOLVITE) 1 MG tablet Take 1 mg by mouth 2 (two) times daily.       . furosemide (LASIX) 20 MG tablet Take 1 tablet (20 mg total) by mouth 2 (two) times daily.  60 tablet  12  . losartan-hydrochlorothiazide (HYZAAR) 100-12.5 MG per tablet Take 1 tablet by  mouth every morning.       . metoprolol (TOPROL-XL) 200 MG 24 hr tablet Take 200 mg by mouth every morning.       Vladimir Faster Glycol-Propyl Glycol (SYSTANE OP) Place 1 drop into both eyes daily as needed (Dry eyes).      . potassium chloride SA (K-DUR,KLOR-CON) 20 MEQ tablet Take 1 tablet (20 mEq total) by mouth 3 (three) times daily.  30 tablet  11  . warfarin (COUMADIN) 2.5 MG tablet Take 2.5-5 mg by mouth daily. Patient takes 1 tablet(2.5mg ) on Mon.,Wed.,Fri., and 2 tablets all other days        Assessment: 80 yo F on chronic Coumadin for Afib.  Home dose listed above.  INR therapeutic.   She was admitted with CAP & started on Rocephin & Zithromax which may interact to cause elevated INR.  No bleeding noted.   Goal of Therapy:  INR 2-3   Plan:  Coumadin 2.5mg  po x1 today. Daily INR  [name] [name] 07/21/2013,7:54 AM

## 2013-07-21 NOTE — Progress Notes (Signed)
One dose of IV Lasix 40 mg given per Dr Dorris Carnes orders,will continue to monitor intake,and output.

## 2013-07-21 NOTE — Progress Notes (Signed)
Subjective: She was admitted to with pneumonia and CHF. She seems to be improving but is still short of breath.  Objective: Vital signs in last 24 hours: Temp:  [97.7 F (36.5 C)-98.7 F (37.1 C)] 97.7 F (36.5 C) (04/03 0447) Pulse Rate:  [61-65] 65 (04/03 0447) Resp:  [20] 20 (04/03 0447) BP: (101-128)/(64-70) 128/70 mmHg (04/03 0447) SpO2:  [94 %-99 %] 94 % (04/03 0827) Weight:  [74.571 kg (164 lb 6.4 oz)] 74.571 kg (164 lb 6.4 oz) (04/03 0447) Weight change: 1.996 kg (4 lb 6.4 oz) Last BM Date: 07/18/13  Intake/Output from previous day: 04/02 0701 - 04/03 0700 In: 1620 [P.O.:1020; IV Piggyback:600] Out: 1300 [Urine:1300]  PHYSICAL EXAM General appearance: alert, cooperative and mild distress Resp: rhonchi bilaterally Cardio: regular rate and rhythm, S1, S2 normal, no murmur, click, rub or gallop GI: soft, non-tender; bowel sounds normal; no masses,  no organomegaly Extremities: extremities normal, atraumatic, no cyanosis or edema  Lab Results:    Basic Metabolic Panel:  Recent Labs  07/19/13 0724 07/19/13 0734 07/20/13 0600 07/21/13 0556  NA 143  --  144 143  K 3.9  --  4.1 4.5  CL 102  --  103 105  CO2 27  --  28 23  GLUCOSE 176*  --  132* 157*  BUN 28*  --  39* 49*  CREATININE 1.22*  --  1.32* 1.09  CALCIUM 9.7  --  9.2 9.2  MG  --  2.0  --   --   PHOS  --  3.7  --   --    Liver Function Tests: No results found for this basename: AST, ALT, ALKPHOS, BILITOT, PROT, ALBUMIN,  in the last 72 hours No results found for this basename: LIPASE, AMYLASE,  in the last 72 hours No results found for this basename: AMMONIA,  in the last 72 hours CBC:  Recent Labs  07/19/13 0724 07/20/13 0600  WBC 11.4* 10.3  NEUTROABS 8.8*  --   HGB 13.4 13.0  HCT 39.9 38.3  MCV 93.2 92.7  PLT 166 177   Cardiac Enzymes: No results found for this basename: CKTOTAL, CKMB, CKMBINDEX, TROPONINI,  in the last 72 hours BNP:  Recent Labs  07/19/13 0724  PROBNP 2488.0*    D-Dimer: No results found for this basename: DDIMER,  in the last 72 hours CBG: No results found for this basename: GLUCAP,  in the last 72 hours Hemoglobin A1C: No results found for this basename: HGBA1C,  in the last 72 hours Fasting Lipid Panel: No results found for this basename: CHOL, HDL, LDLCALC, TRIG, CHOLHDL, LDLDIRECT,  in the last 72 hours Thyroid Function Tests:  Recent Labs  07/19/13 0734  TSH 3.260   Anemia Panel: No results found for this basename: VITAMINB12, FOLATE, FERRITIN, TIBC, IRON, RETICCTPCT,  in the last 72 hours Coagulation:  Recent Labs  07/20/13 0600 07/21/13 0556  LABPROT 22.7* 22.4*  INR 2.08* 2.04*   Urine Drug Screen: Drugs of Abuse  No results found for this basename: labopia, cocainscrnur, labbenz, amphetmu, thcu, labbarb    Alcohol Level: No results found for this basename: ETH,  in the last 72 hours Urinalysis: No results found for this basename: COLORURINE, APPERANCEUR, LABSPEC, PHURINE, GLUCOSEU, HGBUR, BILIRUBINUR, KETONESUR, PROTEINUR, UROBILINOGEN, NITRITE, LEUKOCYTESUR,  in the last 72 hours Misc. Labs:  ABGS No results found for this basename: PHART, PCO2, PO2ART, TCO2, HCO3,  in the last 72 hours CULTURES Recent Results (from the past 240 hour(s))  RESPIRATORY  VIRUS PANEL     Status: Abnormal   Collection Time    07/19/13 11:55 AM      Result Value Ref Range Status   Source - RVPAN NOSE   Final   Respiratory Syncytial Virus A NOT DETECTED   Final   Respiratory Syncytial Virus B NOT DETECTED   Final   Influenza A NOT DETECTED   Final   Influenza B NOT DETECTED   Final   Parainfluenza 1 NOT DETECTED   Final   Parainfluenza 2 NOT DETECTED   Final   Parainfluenza 3 DETECTED (*)  Final   Metapneumovirus NOT DETECTED   Final   Rhinovirus NOT DETECTED   Final   Adenovirus NOT DETECTED   Final   Influenza A H1 NOT DETECTED   Final   Influenza A H3 NOT DETECTED   Final   Comment: (NOTE)           Normal Reference Range  for each Analyte: NOT DETECTED     Testing performed using the Luminex xTAG Respiratory Viral Panel test     kit.     This test was developed and its performance characteristics determined     by [facility]. It has not been cleared or approved by the [place]     Food and Drug Administration. This test is used for clinical purposes.     It should not be regarded as investigational or for research. This     laboratory is certified under the [place] (CLIA) as qualified to perform high complexity     clinical laboratory testing.     Performed at [facility], EXPECTORATED SPUTUM-ASSESSMENT     Status: None   Collection Time    07/19/13  8:23 PM      Result Value Ref Range Status   Specimen Description SPUTUM   Final   Special Requests NONE   Final   Sputum evaluation     Final   Value: THIS SPECIMEN IS ACCEPTABLE. RESPIRATORY CULTURE REPORT TO FOLLOW.   Report Status 07/19/2013 FINAL   Final  CULTURE, RESPIRATORY (NON-EXPECTORATED)     Status: None   Collection Time    07/19/13  8:23 PM      Result Value Ref Range Status   Specimen Description SPUTUM EXPECTORATED   Final   Special Requests NONE   Final   Gram Stain     Final   Value: ABUNDANT WBC PRESENT,BOTH PMN AND MONONUCLEAR     RARE SQUAMOUS EPITHELIAL CELLS PRESENT     RARE GRAM POSITIVE COCCI     IN PAIRS     Performed at [facility]   Culture     Final   Value: NORMAL OROPHARYNGEAL FLORA     Performed at [facility]   Report Status PENDING   Incomplete   Studies/Results: No results found.  Medications:  Prior to Admission:  Prescriptions prior to admission  Medication Sig Dispense Refill  . acetaminophen (TYLENOL) 500 MG tablet Take 500 mg by mouth every 6 (six) hours as needed for pain.      [name] amLODipine-atorvastatin (CADUET) 5-20 MG per tablet Take 1 tablet by mouth at bedtime.      [name] aspirin EC 81 MG tablet Take 81 mg by mouth  daily.      . Calcium Carbonate-Vitamin D (CALCIUM 600 + D PO) Take 1 tablet by mouth 2 (two) times daily.      [name]  Cholecalciferol (VITAMIN D) 2000 UNITS CAPS Take 1 capsule by mouth at bedtime.       . folic acid (FOLVITE) 1 MG tablet Take 1 mg by mouth 2 (two) times daily.       . furosemide (LASIX) 20 MG tablet Take 1 tablet (20 mg total) by mouth 2 (two) times daily.  60 tablet  12  . losartan-hydrochlorothiazide (HYZAAR) 100-12.5 MG per tablet Take 1 tablet by mouth every morning.       . metoprolol (TOPROL-XL) 200 MG 24 hr tablet Take 200 mg by mouth every morning.       Vladimir Faster Glycol-Propyl Glycol (SYSTANE OP) Place 1 drop into both eyes daily as needed (Dry eyes).      . potassium chloride SA (K-DUR,KLOR-CON) 20 MEQ tablet Take 1 tablet (20 mEq total) by mouth 3 (three) times daily.  30 tablet  11  . warfarin (COUMADIN) 2.5 MG tablet Take 2.5-5 mg by mouth daily. Patient takes 1 tablet(2.39m) on Mon.,Wed.,Fri., and 2 tablets all other days       Scheduled: . amLODipine  5 mg Oral QHS   And  . atorvastatin  20 mg Oral QHS  . aspirin EC  81 mg Oral Daily  . azithromycin  500 mg Intravenous Q24H  . cefTRIAXone (ROCEPHIN)  IV  1 g Intravenous Q24H  . folic acid  1 mg Oral BID  . furosemide  40 mg Intravenous Once  . levalbuterol  0.63 mg Nebulization TID PC & HS  . losartan  100 mg Oral Daily  . methylPREDNISolone (SOLU-MEDROL) injection  40 mg Intravenous Q12H  . metoprolol  200 mg Oral q morning - 10a  . potassium chloride SA  20 mEq Oral TID  . sodium chloride  3 mL Intravenous Q12H  . warfarin  2.5 mg Oral Once  . Warfarin - Pharmacist Dosing Inpatient   Does not apply Q24H   Continuous:  PJWT:GRMBOBchloride, acetaminophen, guaiFENesin-dextromethorphan, HYDROcodone-acetaminophen, levalbuterol, ondansetron (ZOFRAN) IV, ondansetron, sodium chloride  Assesment: She was admitted with community-acquired pneumonia. She has shortness of breath from that. She has acute on chronic  diastolic CHF but I think that seems to be pretty well controlled. Active Problems:   CAP (community acquired pneumonia)   Shortness of breath   Acute on chronic diastolic CHF (congestive heart failure)    Plan: Continue current treatments    LOS: 2 days   Mariyanna Mucha L 07/21/2013, 9:55 AM

## 2013-07-22 LAB — PROTIME-INR
INR: 3.2 — ABNORMAL HIGH (ref 0.00–1.49)
Prothrombin Time: 31.6 seconds — ABNORMAL HIGH (ref 11.6–15.2)

## 2013-07-22 LAB — CULTURE, RESPIRATORY W GRAM STAIN

## 2013-07-22 LAB — CULTURE, RESPIRATORY: CULTURE: NORMAL

## 2013-07-22 LAB — PRO B NATRIURETIC PEPTIDE: Pro B Natriuretic peptide (BNP): 1118 pg/mL — ABNORMAL HIGH (ref 0–450)

## 2013-07-22 MED ORDER — CEPHALEXIN 500 MG PO CAPS: 500.0000 mg | ORAL_CAPSULE | Freq: Three times a day (TID) | ORAL | Status: DC

## 2013-07-22 MED ORDER — AZITHROMYCIN 250 MG PO TABS: ORAL_TABLET | ORAL | Status: AC

## 2013-07-22 NOTE — Progress Notes (Signed)
Discharge instructions given on medications,and follow up visits,patient verbalized understanding.Prescriptions are to be picked up at Pharmacy of choice documented on the AVS. Patient also given instructions call [facility],to make follow up appointments for PT/INR levels,patient verbalized understanding. No c/o pain or discomfort noted. Accompanied by staff to an awaiting vehicle.

## 2013-07-22 NOTE — Progress Notes (Signed)
[place] for Coumadin Indication: atrial fibrillation  Allergies  Allergen Reactions  . Codeine Nausea And Vomiting    Patient Measurements: Height: 5\' 1"  (154.9 cm) Weight: 167 lb 12.8 oz (76.114 kg) IBW/kg (Calculated) : 47.8  Vital Signs: Temp: 97.2 F (36.2 C) (04/04 0355) Temp src: Oral (04/04 0355) BP: 140/62 mmHg (04/04 0953) Pulse Rate: 62 (04/04 0953)  Labs:  Recent Labs  07/20/13 0600 07/21/13 0556 07/22/13 0505  HGB 13.0  --   --   HCT 38.3  --   --   PLT 177  --   --   LABPROT 22.7* 22.4* 31.6*  INR 2.08* 2.04* 3.20*  CREATININE 1.32* 1.09  --     Estimated Creatinine Clearance: 39 ml/min (by C-G formula based on Cr of 1.09).   Medical History: Past Medical History  Diagnosis Date  . Osteoarthritis   . Mixed hyperlipidemia   . Coronary atherosclerosis of native coronary artery     Multivessel status post CABG, DES PLA March 2006  . Essential hypertension, benign   . Heart block   . Syncope   . Ventricular tachycardia     Status post ICD  . Ischemic cardiomyopathy     LVEF 55% as of March 2014  . Atrial fibrillation     Medications:  Prescriptions prior to admission  Medication Sig Dispense Refill  . acetaminophen (TYLENOL) 500 MG tablet Take 500 mg by mouth every 6 (six) hours as needed for pain.      [name] amLODipine-atorvastatin (CADUET) 5-20 MG per tablet Take 1 tablet by mouth at bedtime.      [name] aspirin EC 81 MG tablet Take 81 mg by mouth daily.      . Calcium Carbonate-Vitamin D (CALCIUM 600 + D PO) Take 1 tablet by mouth 2 (two) times daily.      . Cholecalciferol (VITAMIN D) 2000 UNITS CAPS Take 1 capsule by mouth at bedtime.       . folic acid (FOLVITE) 1 MG tablet Take 1 mg by mouth 2 (two) times daily.       . furosemide (LASIX) 20 MG tablet Take 1 tablet (20 mg total) by mouth 2 (two) times daily.  60 tablet  12  . losartan-hydrochlorothiazide (HYZAAR) 100-12.5 MG per tablet Take 1 tablet by  mouth every morning.       . metoprolol (TOPROL-XL) 200 MG 24 hr tablet Take 200 mg by mouth every morning.       Vladimir Faster Glycol-Propyl Glycol (SYSTANE OP) Place 1 drop into both eyes daily as needed (Dry eyes).      . potassium chloride SA (K-DUR,KLOR-CON) 20 MEQ tablet Take 1 tablet (20 mEq total) by mouth 3 (three) times daily.  30 tablet  11  . warfarin (COUMADIN) 2.5 MG tablet Take 2.5-5 mg by mouth daily. Patient takes 1 tablet(2.5mg ) on Mon.,Wed.,Fri., and 2 tablets all other days        Assessment: 80 yo F on chronic Coumadin for Afib.  Home dose listed above.  INR therapeutic on admission, but has risen above goal range today.   She was admitted with CAP & started on Rocephin & Zithromax which may interact to cause elevated INR.  No bleeding noted.   Goal of Therapy:  INR 2-3   Plan:  Hold Coumadin today. Daily INR  Raneen Jaffer, Lavonia Drafts 07/22/2013,10:25 AM

## 2013-07-22 NOTE — Progress Notes (Signed)
Ambulated patient in hallway today,without difficulty,room air saturation 93% before ambulation,heart rate 93. While ambulating patient in hallway, room air saturation was 94%,heart rate 106. No co pain or discomfort noted.

## 2013-07-24 ENCOUNTER — Telehealth: Payer: Self-pay | Admitting: *Deleted

## 2013-07-24 NOTE — Telephone Encounter (Signed)
Pt was seen in hospital and does not know when she needs to follow up again

## 2013-07-25 NOTE — Telephone Encounter (Signed)
Pt was in [facility] 07/19/13 - 07/21/13 for CAP and CHF. D/C INR was 3.2.  Was was D/C on Z pack (4/5 - 4/9) and Keflex 500mg  tid x 5 days (4/5 - 4/9).  Wants to know when to come for INR check.  Made appt to see pt tomorrow 07/26/13.  Pt in agreement.

## 2013-07-25 NOTE — Telephone Encounter (Signed)
Pt was in [facility] 07/19/13 - 07/21/13 for CAP and CHF.  D/C INR was

## 2013-07-26 ENCOUNTER — Ambulatory Visit (INDEPENDENT_AMBULATORY_CARE_PROVIDER_SITE_OTHER): Payer: Medicare Other | Admitting: *Deleted

## 2013-07-26 DIAGNOSIS — I4891 Unspecified atrial fibrillation: Secondary | ICD-10-CM

## 2013-07-26 DIAGNOSIS — Z5181 Encounter for therapeutic drug level monitoring: Secondary | ICD-10-CM

## 2013-07-26 DIAGNOSIS — I509 Heart failure, unspecified: Secondary | ICD-10-CM

## 2013-07-26 DIAGNOSIS — I2789 Other specified pulmonary heart diseases: Secondary | ICD-10-CM

## 2013-07-26 DIAGNOSIS — Z7901 Long term (current) use of anticoagulants: Secondary | ICD-10-CM

## 2013-07-26 LAB — POCT INR: INR: 2.6

## 2013-07-29 NOTE — Discharge Summary (Signed)
Physician Discharge Summary  Patient ID: [name] [name] MRN: [id number] DOB/AGE: 09-28-1931 80 y.o. Primary Care Physician:Anne Sebring L, MD Admit date: 07/19/2013 Discharge date: 07/29/2013    Discharge Diagnoses:   Active Problems:   CAP (community acquired pneumonia)   Shortness of breath   Acute on chronic diastolic CHF (congestive heart failure)  atrial fibrillation Aortic stenosis Chronic anti-coagulation Hypertension Status post defibrillator implantation Coronary artery occlusive disease    Medication List         acetaminophen 500 MG tablet  Commonly known as:  TYLENOL  Take 500 mg by mouth every 6 (six) hours as needed for pain.     amLODipine-atorvastatin 5-20 MG per tablet  Commonly known as:  CADUET  Take 1 tablet by mouth at bedtime.     aspirin EC 81 MG tablet  Take 81 mg by mouth daily.     CALCIUM 600 + D PO  Take 1 tablet by mouth 2 (two) times daily.     cephALEXin 500 MG capsule  Commonly known as:  KEFLEX  Take 1 capsule (500 mg total) by mouth 3 (three) times daily.     folic acid 1 MG tablet  Commonly known as:  FOLVITE  Take 1 mg by mouth 2 (two) times daily.     furosemide 20 MG tablet  Commonly known as:  LASIX  Take 1 tablet (20 mg total) by mouth 2 (two) times daily.     losartan-hydrochlorothiazide 100-12.5 MG per tablet  Commonly known as:  HYZAAR  Take 1 tablet by mouth every morning.     metoprolol 200 MG 24 hr tablet  Commonly known as:  TOPROL-XL  Take 200 mg by mouth every morning.     potassium chloride SA 20 MEQ tablet  Commonly known as:  K-DUR,KLOR-CON  Take 1 tablet (20 mEq total) by mouth 3 (three) times daily.     SYSTANE OP  Place 1 drop into both eyes daily as needed (Dry eyes).     Vitamin D 2000 UNITS Caps  Take 1 capsule by mouth at bedtime.     warfarin 2.5 MG tablet  Commonly known as:  COUMADIN  Take 2.5-5 mg by mouth daily. Patient takes 1 tablet(2.39m) on Mon.,Wed.,Fri., and 2 tablets all other  days        Discharged Condition: Improved    Consults: Cardiology  Significant Diagnostic Studies: Dg Chest Portable 1 View  07/19/2013   CLINICAL DATA:  Respiratory distress  EXAM: PORTABLE CHEST - 1 VIEW  COMPARISON:  04/11/2013  FINDINGS: Cardiomegaly again noted. Three leads cardiac pacemaker is unchanged in position. Status post CABG. Hazy right basilar atelectasis or infiltrate. No pulmonary edema.  IMPRESSION: Cardiomegaly. Stable cardiac pacemaker position. No pulmonary edema. Hazy right basilar atelectasis or infiltrate.   Electronically Signed   By: LLahoma CrockerM.D.   On: 07/19/2013 07:43    Lab Results: Basic Metabolic Panel: No results found for this basename: NA, K, CL, CO2, GLUCOSE, BUN, CREATININE, CALCIUM, MG, PHOS,  in the last 72 hours Liver Function Tests: No results found for this basename: AST, ALT, ALKPHOS, BILITOT, PROT, ALBUMIN,  in the last 72 hours   CBC: No results found for this basename: WBC, NEUTROABS, HGB, HCT, MCV, PLT,  in the last 72 hours  Recent Results (from the past 240 hour(s))  RESPIRATORY VIRUS PANEL     Status: Abnormal   Collection Time    07/19/13 11:55 AM      Result Value Ref  Range Status   Source - RVPAN NOSE   Final   Respiratory Syncytial Virus A NOT DETECTED   Final   Respiratory Syncytial Virus B NOT DETECTED   Final   Influenza A NOT DETECTED   Final   Influenza B NOT DETECTED   Final   Parainfluenza 1 NOT DETECTED   Final   Parainfluenza 2 NOT DETECTED   Final   Parainfluenza 3 DETECTED (*)  Final   Metapneumovirus NOT DETECTED   Final   Rhinovirus NOT DETECTED   Final   Adenovirus NOT DETECTED   Final   Influenza A H1 NOT DETECTED   Final   Influenza A H3 NOT DETECTED   Final   Comment: (NOTE)           Normal Reference Range for each Analyte: NOT DETECTED     Testing performed using the Luminex xTAG Respiratory Viral Panel test     kit.     This test was developed and its performance characteristics determined      by [facility]. It has not been cleared or approved by the [place]     Food and Drug Administration. This test is used for clinical purposes.     It should not be regarded as investigational or for research. This     laboratory is certified under the [place] (CLIA) as qualified to perform high complexity     clinical laboratory testing.     Performed at [facility], EXPECTORATED SPUTUM-ASSESSMENT     Status: None   Collection Time    07/19/13  8:23 PM      Result Value Ref Range Status   Specimen Description SPUTUM   Final   Special Requests NONE   Final   Sputum evaluation     Final   Value: THIS SPECIMEN IS ACCEPTABLE. RESPIRATORY CULTURE REPORT TO FOLLOW.   Report Status 07/19/2013 FINAL   Final  CULTURE, RESPIRATORY (NON-EXPECTORATED)     Status: None   Collection Time    07/19/13  8:23 PM      Result Value Ref Range Status   Specimen Description SPUTUM EXPECTORATED   Final   Special Requests NONE   Final   Gram Stain     Final   Value: ABUNDANT WBC PRESENT,BOTH PMN AND MONONUCLEAR     RARE SQUAMOUS EPITHELIAL CELLS PRESENT     RARE GRAM POSITIVE COCCI     IN PAIRS     Performed at [facility]   Culture     Final   Value: NORMAL OROPHARYNGEAL FLORA     Performed at [facility]   Report Status 07/22/2013 FINAL   Final     Hospital Course: This is a 80 year old who came to the emergency department with shortness of breath. She had a hospitalization about 3 months ago with acute congestive heart failure. She was concerned that this was happening again but her chest x-ray looked more like she had pneumonia. She was treated with intravenous antibiotics. She had cardiology consultation and it was felt that she was doing relatively well with her CHF. She improved over the next several days to the point that she was able to ambulate in the room without a lot of dyspnea. She was ready for  discharge.  Discharge Exam: Blood pressure 140/62, pulse 106, temperature 97.2 F (36.2 C), temperature source Oral, resp. rate 20, height '5\' 1"'  (1.549  m), weight 76.114 kg (167 lb 12.8 oz), SpO2 95.00%. She is awake and alert. She is in atrial fibrillation. Her chest is relatively clear.  Disposition: Home. We discussed home health services but she is a retired Marine scientist and did not feel that she needed that      Discharge Orders   Future Appointments Provider Department Dept Phone   08/15/2013 8:00 AM [facility] [place] [phone]   08/16/2013 11:30 AM [facility] [place] [phone]   09/13/2013 2:00 PM Satira Sark, MD [place] [phone]   Future Orders Complete By Expires   Discharge patient  As directed       Follow-up Information   Follow up with Minerva Bluett L, MD. (patient to keep schedule appointment.)    Specialty:  Pulmonary Disease   Contact information:   [address] [place] [address] [phone]       Follow up with Rozann Lesches, MD. (patient to call office,and make appointment to follow up with [facility] for PT/INR)    Specialty:  Cardiology   Contact information:   [place] [address] [phone]       Signed: Alonza Bogus   07/29/2013, 10:52 AM

## 2013-07-31 NOTE — Addendum Note (Signed)
Addended by: Shiela Mayer on: 07/31/2013 12:27 PM   Modules accepted: Level of Service

## 2013-08-03 ENCOUNTER — Encounter: Payer: Self-pay | Admitting: *Deleted

## 2013-08-09 ENCOUNTER — Other Ambulatory Visit: Payer: Self-pay | Admitting: Cardiology

## 2013-08-15 ENCOUNTER — Encounter: Payer: Self-pay | Admitting: Internal Medicine

## 2013-08-15 ENCOUNTER — Ambulatory Visit (INDEPENDENT_AMBULATORY_CARE_PROVIDER_SITE_OTHER): Payer: Medicare Other | Admitting: *Deleted

## 2013-08-15 DIAGNOSIS — Z95810 Presence of automatic (implantable) cardiac defibrillator: Secondary | ICD-10-CM

## 2013-08-15 LAB — MDC_IDC_ENUM_SESS_TYPE_REMOTE
Battery Voltage: 2.61 V
Brady Statistic AP VP Percent: 91.83 %
Brady Statistic AS VP Percent: 8.03 %
Brady Statistic RA Percent Paced: 91.9 %
HIGH POWER IMPEDANCE MEASURED VALUE: 53 Ohm
HighPow Impedance: 72 Ohm
Lead Channel Impedance Value: 400 Ohm
Lead Channel Setting Pacing Amplitude: 2 V
Lead Channel Setting Pacing Amplitude: 2.5 V
Lead Channel Setting Pacing Pulse Width: 0.5 ms
Lead Channel Setting Sensing Sensitivity: 0.45 mV
MDC IDC MSMT LEADCHNL RA IMPEDANCE VALUE: 592 Ohm
MDC IDC MSMT LEADCHNL RA SENSING INTR AMPL: 2.6947
MDC IDC SESS DTM: 20150428130545
MDC IDC STAT BRADY AP VS PERCENT: 0.07 %
MDC IDC STAT BRADY AS VS PERCENT: 0.06 %
MDC IDC STAT BRADY RV PERCENT PACED: 99.87 %
Zone Setting Detection Interval: 300 ms
Zone Setting Detection Interval: 350 ms
Zone Setting Detection Interval: 350 ms
Zone Setting Detection Interval: 450 ms

## 2013-08-16 ENCOUNTER — Ambulatory Visit (INDEPENDENT_AMBULATORY_CARE_PROVIDER_SITE_OTHER): Payer: Medicare Other | Admitting: *Deleted

## 2013-08-16 DIAGNOSIS — Z5181 Encounter for therapeutic drug level monitoring: Secondary | ICD-10-CM

## 2013-08-16 DIAGNOSIS — I4891 Unspecified atrial fibrillation: Secondary | ICD-10-CM

## 2013-08-16 DIAGNOSIS — I2789 Other specified pulmonary heart diseases: Secondary | ICD-10-CM

## 2013-08-16 DIAGNOSIS — I509 Heart failure, unspecified: Secondary | ICD-10-CM

## 2013-08-16 DIAGNOSIS — Z7901 Long term (current) use of anticoagulants: Secondary | ICD-10-CM

## 2013-08-16 LAB — POCT INR: INR: 2.5

## 2013-08-21 NOTE — Addendum Note (Signed)
Addended by: Kendell Bane on: 08/21/2013 09:16 AM   Modules accepted: Level of Service

## 2013-08-29 ENCOUNTER — Ambulatory Visit (HOSPITAL_COMMUNITY)
Admission: RE | Admit: 2013-08-29 | Discharge: 2013-08-29 | Disposition: A | Discharge status: Home / Self Care | Payer: Medicare Other | Source: Ambulatory Visit | Attending: Pulmonary Disease | Admitting: Pulmonary Disease

## 2013-08-29 ENCOUNTER — Other Ambulatory Visit (HOSPITAL_COMMUNITY): Payer: Self-pay | Admitting: Pulmonary Disease

## 2013-08-29 DIAGNOSIS — J189 Pneumonia, unspecified organism: Secondary | ICD-10-CM

## 2013-09-07 ENCOUNTER — Encounter: Payer: Self-pay | Admitting: *Deleted

## 2013-09-07 LAB — BASIC METABOLIC PANEL
BUN: 35 mg/dL — AB (ref 6–23)
CHLORIDE: 107 meq/L (ref 96–112)
CO2: 25 meq/L (ref 19–32)
CREATININE: 1.17 mg/dL — AB (ref 0.50–1.10)
Calcium: 9.4 mg/dL (ref 8.4–10.5)
GLUCOSE: 100 mg/dL — AB (ref 70–99)
POTASSIUM: 4.4 meq/L (ref 3.5–5.3)
Sodium: 142 mEq/L (ref 135–145)

## 2013-09-08 ENCOUNTER — Encounter: Payer: Self-pay | Admitting: *Deleted

## 2013-09-08 ENCOUNTER — Encounter: Payer: Self-pay | Admitting: Internal Medicine

## 2013-09-08 ENCOUNTER — Ambulatory Visit (INDEPENDENT_AMBULATORY_CARE_PROVIDER_SITE_OTHER): Payer: Medicare Other | Admitting: Internal Medicine

## 2013-09-08 VITALS — BP 140/72 | HR 63 | Ht 62.0 in | Wt 164.0 lb

## 2013-09-08 DIAGNOSIS — I4729 Other ventricular tachycardia: Secondary | ICD-10-CM

## 2013-09-08 DIAGNOSIS — I5032 Chronic diastolic (congestive) heart failure: Secondary | ICD-10-CM

## 2013-09-08 DIAGNOSIS — I4891 Unspecified atrial fibrillation: Secondary | ICD-10-CM

## 2013-09-08 DIAGNOSIS — Z01812 Encounter for preprocedural laboratory examination: Secondary | ICD-10-CM

## 2013-09-08 DIAGNOSIS — I472 Ventricular tachycardia: Secondary | ICD-10-CM

## 2013-09-08 DIAGNOSIS — I509 Heart failure, unspecified: Secondary | ICD-10-CM

## 2013-09-08 DIAGNOSIS — I10 Essential (primary) hypertension: Secondary | ICD-10-CM

## 2013-09-08 DIAGNOSIS — Z95810 Presence of automatic (implantable) cardiac defibrillator: Secondary | ICD-10-CM

## 2013-09-08 DIAGNOSIS — I5033 Acute on chronic diastolic (congestive) heart failure: Secondary | ICD-10-CM

## 2013-09-08 NOTE — Patient Instructions (Addendum)
Your physician recommends that you schedule a follow-up appointment in: 3 months with Dr Lovena Le   Your physician has recommended that you have a defibrillator inserted. An implantable cardioverter defibrillator (ICD) is a small device that is placed in your chest or, in rare cases, your abdomen. This device uses electrical pulses or shocks to help control life-threatening, irregular heartbeats that could lead the heart to suddenly stop beating (sudden cardiac arrest). Leads are attached to the ICD that goes into your heart. This is done in the hospital and usually requires an overnight stay. Please see the instruction sheet given to you today for more information.  Your physician has requested that you have an echocardiogram. Echocardiography is a painless test that uses sound waves to create images of your heart. It provides your doctor with information about the size and shape of your heart and how well your heart's chambers and valves are working. This procedure takes approximately one hour. There are no restrictions for this procedure.

## 2013-09-08 NOTE — Progress Notes (Signed)
HPI [name] [name] returns today for followup. She is a pleasant 81 yo woman with a h/o CAD, s/p CABG, CHB, s/p PPM who developed VF and syncope all documented by her PPM. She ultimately underwent insertion of an ICD and has done well. She has reached ERI on her device. No ICD shock. Over the past several months, she has had worsening of CHF. She was hospitalized with CHF in 12/14 and again in 3/15. She is now back to class 2 symptoms but she has pacing induced LBBB with a QRS duration of 180 ms. Suprisingly she has preserved LV function.  Allergies  Allergen Reactions  . Codeine Nausea And Vomiting     Current Outpatient Prescriptions  Medication Sig Dispense Refill  . acetaminophen (TYLENOL) 500 MG tablet Take 500 mg by mouth every 6 (six) hours as needed for pain.      . amLODipine-atorvastatin (CADUET) 5-20 MG per tablet Take 1 tablet by mouth at bedtime.      . aspirin EC 81 MG tablet Take 81 mg by mouth daily.      . Calcium Carbonate-Vitamin D (CALCIUM 600 + D PO) Take 1 tablet by mouth 2 (two) times daily.      . Cholecalciferol (VITAMIN D) 2000 UNITS CAPS Take 1 capsule by mouth at bedtime.       . folic acid (FOLVITE) 1 MG tablet Take 1 mg by mouth 2 (two) times daily.       . furosemide (LASIX) 20 MG tablet Take 1 tablet (20 mg total) by mouth 2 (two) times daily.  60 tablet  12  . losartan-hydrochlorothiazide (HYZAAR) 100-12.5 MG per tablet Take 1 tablet by mouth every morning.       . metoprolol (TOPROL-XL) 200 MG 24 hr tablet Take 200 mg by mouth every morning.       . Polyethyl Glycol-Propyl Glycol (SYSTANE OP) Place 1 drop into both eyes daily as needed (Dry eyes).      . potassium chloride SA (K-DUR,KLOR-CON) 20 MEQ tablet Take 1 tablet (20 mEq total) by mouth 3 (three) times daily.  30 tablet  11  . warfarin (COUMADIN) 2.5 MG tablet Take 2 tablets daily except 1 tablet on Mondays, Wednesdays and Fridays  48 tablet  6   No current facility-administered medications for this visit.      Past Medical History  Diagnosis Date  . Osteoarthritis   . Mixed hyperlipidemia   . Coronary atherosclerosis of native coronary artery     Multivessel status post CABG, DES PLA March 2006  . Essential hypertension, benign   . Heart block   . Syncope   . Ventricular tachycardia     Status post ICD  . Ischemic cardiomyopathy     LVEF 55% as of March 2014  . Atrial fibrillation     ROS:   All systems reviewed and negative except as noted in the HPI.   Past Surgical History  Procedure Laterality Date  . Coronary artery bypass graft      LIMA to LAD, SVG to diagonal, SVG to ramus and OM  . Cardiac defibrillator placement      Guidant device  . Yag laser application Left 12/27/2012    Procedure: YAG LASER APPLICATION;  Surgeon: Mark T. Shapiro, MD;  Location: [facility];  Service: Ophthalmology;  Laterality: Left;  . Yag laser application Right 01/10/2013    Procedure: YAG LASER APPLICATION;  Surgeon: Mark T. Shapiro, MD;  Location: [facility];  Service: Ophthalmology;    Laterality: Right;     Family History  Problem Relation Age of Onset  . Heart attack Father   . Heart attack Brother      History   Social History  . Marital Status: Widowed    Spouse Name: N/A    Number of Children: 2  . Years of Education: N/A   Occupational History  . Retired     Registered nurse  .     Social History Main Topics  . Smoking status: Never Smoker   . Smokeless tobacco: Never Used  . Alcohol Use: No  . Drug Use: No  . Sexual Activity: No   Other Topics Concern  . Not on file   Social History Narrative  . No narrative on file     BP 140/72  Pulse 63  Ht 5' 2" (1.575 m)  Wt 164 lb (74.39 kg)  BMI 29.99 kg/m2  Physical Exam:  Well appearing 81 yo woman, NAD HEENT: Unremarkable Neck:  7 cm JVD, no thyromegally Lungs:  Clear with no wheezes, rales, or rhonchi HEART:  Regular rate rhythm, no murmurs, no rubs, no clicks Abd:  soft, positive bowel sounds, no  organomegally, no rebound, no guarding Ext:  2 plus pulses, no edema, no cyanosis, no clubbing Skin:  No rashes no nodules Neuro:  CN II through XII intact, motor grossly intact   DEVICE  Normal device function.  See PaceArt for details.   Assess/Plan: 

## 2013-09-08 NOTE — Assessment & Plan Note (Signed)
She has reached ERI on her device. I initially thought that BiV Upgrade was indicated with her CHF but she has preserved LV function. Will schedule generator change out and I plan to repeat her echo. If EF is less than 50%, will place LV lead but if above 50%, will not.

## 2013-09-08 NOTE — Assessment & Plan Note (Signed)
She has had no recurrent sustained ventricular arrhythmias or syncope or ICD shock. No change in her medications.

## 2013-09-08 NOTE — Assessment & Plan Note (Addendum)
The patient has class 2-3 CHF symptoms. She has VT and she has CHB. Her EF is preserved however. I plan to recheck her echo as she has symptoms more consistent with systolic heart failure.

## 2013-09-12 ENCOUNTER — Encounter (HOSPITAL_COMMUNITY): Payer: Self-pay | Admitting: Pharmacy Technician

## 2013-09-13 ENCOUNTER — Encounter: Payer: Self-pay | Admitting: Cardiology

## 2013-09-13 ENCOUNTER — Ambulatory Visit (HOSPITAL_COMMUNITY)
Admission: RE | Admit: 2013-09-13 | Discharge: 2013-09-13 | Disposition: A | Discharge status: Home / Self Care | Payer: Medicare Other | Source: Ambulatory Visit | Attending: Internal Medicine | Admitting: Internal Medicine

## 2013-09-13 ENCOUNTER — Ambulatory Visit (INDEPENDENT_AMBULATORY_CARE_PROVIDER_SITE_OTHER): Payer: Medicare Other | Admitting: *Deleted

## 2013-09-13 ENCOUNTER — Ambulatory Visit (INDEPENDENT_AMBULATORY_CARE_PROVIDER_SITE_OTHER): Payer: Medicare Other | Admitting: Cardiology

## 2013-09-13 VITALS — BP 128/62 | HR 48 | Ht 62.0 in | Wt 163.0 lb

## 2013-09-13 DIAGNOSIS — I10 Essential (primary) hypertension: Secondary | ICD-10-CM | POA: Insufficient documentation

## 2013-09-13 DIAGNOSIS — I5032 Chronic diastolic (congestive) heart failure: Secondary | ICD-10-CM

## 2013-09-13 DIAGNOSIS — Z95810 Presence of automatic (implantable) cardiac defibrillator: Secondary | ICD-10-CM

## 2013-09-13 DIAGNOSIS — I4891 Unspecified atrial fibrillation: Secondary | ICD-10-CM | POA: Insufficient documentation

## 2013-09-13 DIAGNOSIS — Z5181 Encounter for therapeutic drug level monitoring: Secondary | ICD-10-CM

## 2013-09-13 DIAGNOSIS — I359 Nonrheumatic aortic valve disorder, unspecified: Secondary | ICD-10-CM

## 2013-09-13 DIAGNOSIS — E785 Hyperlipidemia, unspecified: Secondary | ICD-10-CM | POA: Insufficient documentation

## 2013-09-13 DIAGNOSIS — I509 Heart failure, unspecified: Secondary | ICD-10-CM | POA: Insufficient documentation

## 2013-09-13 DIAGNOSIS — I2510 Atherosclerotic heart disease of native coronary artery without angina pectoris: Secondary | ICD-10-CM | POA: Insufficient documentation

## 2013-09-13 DIAGNOSIS — Z7901 Long term (current) use of anticoagulants: Secondary | ICD-10-CM

## 2013-09-13 DIAGNOSIS — I2789 Other specified pulmonary heart diseases: Secondary | ICD-10-CM

## 2013-09-13 DIAGNOSIS — Z79899 Other long term (current) drug therapy: Secondary | ICD-10-CM | POA: Insufficient documentation

## 2013-09-13 LAB — POCT INR: INR: 3

## 2013-09-13 NOTE — Assessment & Plan Note (Signed)
History of paroxysmal ventricular tachycardia, none for quite some time. She has a Guidant ICD in place, will be undergoing generator change with Dr. Lovena Le in early June. Question remains based on LVEF as to whether this will be upgraded to biventricular device or not.

## 2013-09-13 NOTE — Progress Notes (Signed)
Clinical Summary Ms. Armon is a 80 y.o.female last seen in February of this year. Recent interval visit with Dr. Lovena Le noted - plan is to repeat echocardiogram in light of heart failure symptoms prior to deciding about her generator change and potential upgrade to biventricular device. I reviewed her records, she was in the hospital in April with community-acquired pneumonia, also some component of acute on chronic diastolic heart failure. She has maintained a stable diuretic regimen. Weight is down 4 pounds from April.  She is due for a followup INR today, and following this will present for her repeat echocardiogram.  Echocardiogram from January 2015 showed LVEF 60-65% with grade 2 diastolic dysfunction, mildly sclerotic to stenotic aortic valve with mean gradient 13 mm mercury, mild mitral regurgitation, mild to moderate left atrial enlargement, device wire noted in the right heart, mild tricuspid regurgitation, PASP 46 mm mercury.    Allergies  Allergen Reactions  . Codeine Nausea And Vomiting    Current Outpatient Prescriptions  Medication Sig Dispense Refill  . acetaminophen (TYLENOL) 500 MG tablet Take 500 mg by mouth every 6 (six) hours as needed for pain.      [name] amLODipine-atorvastatin (CADUET) 5-20 MG per tablet Take 1 tablet by mouth at bedtime.      [name] aspirin EC 81 MG tablet Take 81 mg by mouth daily.      . Calcium Carbonate-Vitamin D (CALCIUM 600 + D PO) Take 1 tablet by mouth 2 (two) times daily.      . Cholecalciferol (VITAMIN D) 2000 UNITS CAPS Take 1 capsule by mouth at bedtime.       . folic acid (FOLVITE) 1 MG tablet Take 1 mg by mouth 2 (two) times daily.       . furosemide (LASIX) 20 MG tablet Take 1 tablet (20 mg total) by mouth 2 (two) times daily.  60 tablet  12  . losartan-hydrochlorothiazide (HYZAAR) 100-12.5 MG per tablet Take 1 tablet by mouth every morning.       . metoprolol (TOPROL-XL) 200 MG 24 hr tablet Take 200 mg by mouth every morning.       Vladimir Faster Glycol-Propyl Glycol (SYSTANE OP) Place 1 drop into both eyes daily as needed (Dry eyes).      . potassium chloride SA (K-DUR,KLOR-CON) 20 MEQ tablet Take 1 tablet (20 mEq total) by mouth 3 (three) times daily.  30 tablet  11  . warfarin (COUMADIN) 2.5 MG tablet Take 2 tablets daily except 1 tablet on Mondays, Wednesdays and Fridays  48 tablet  6   No current facility-administered medications for this visit.    Past Medical History  Diagnosis Date  . Osteoarthritis   . Mixed hyperlipidemia   . Coronary atherosclerosis of native coronary artery     Multivessel status post CABG, DES PLA March 2006  . Essential hypertension, benign   . Heart block   . Syncope   . Ventricular tachycardia     Status post ICD  . Ischemic cardiomyopathy     LVEF 55% as of March 2014  . Atrial fibrillation     Past Surgical History  Procedure Laterality Date  . Coronary artery bypass graft      LIMA to LAD, SVG to diagonal, SVG to ramus and OM  . Cardiac defibrillator placement      Guidant device  . Yag laser application Left 04/24/7827    Procedure: YAG LASER APPLICATION;  Surgeon: Elta Guadeloupe T. Gershon Crane, MD;  Location: [facility];  Service: Ophthalmology;  Laterality: Left;  . Yag laser application Right 1/96/2229    Procedure: YAG LASER APPLICATION;  Surgeon: Elta Guadeloupe T. Gershon Crane, MD;  Location: [facility];  Service: Ophthalmology;  Laterality: Right;    Social History Ms. Gores reports that she has never smoked. She has never used smokeless tobacco. Ms. Abascal reports that she does not drink alcohol.  Review of Systems No chest pain or palpitations. Bruises easily. No major bleeding episodes. Otherwise as outlined.  Physical Examination Filed Vitals:   09/13/13 1426  BP: 128/62  Pulse: 48   Filed Weights   09/13/13 1426  Weight: 163 lb (73.936 kg)    Comfortable at rest.  HEENT: Conjunctiva and lids normal, oropharynx clear. Resolving ecchymoses left forehead and periorbital region.  Neck:  Supple, no elevated JVP or carotid bruits, no thyromegaly.  Lungs: Clear to auscultation, nonlabored breathing at rest.  Cardiac: Regular rate and rhythm, no S3, soft systolic murmur, no pericardial rub.  Abdomen: Soft, nontender, bowel sounds present.  Extremities: Trace right edema, distal pulses 2+.  Skin: Warm and dry.  Musculoskeletal: No kyphosis.  Neuropsychiatric: Alert and oriented x3, affect grossly appropriate.   Problem List and Plan   Chronic diastolic heart failure Reasonably stable at this point, weight is down. Records reviewed. She will be getting a followup echocardiogram later today for reassessment of LVEF.  No changes made to current regimen.  Atrial fibrillation Continues on Coumadin, followup in the Coumadin clinic.  Coronary atherosclerosis of native coronary artery No active angina symptoms on medical therapy.  IMPLANTATION OF DEFIBRILLATOR, HX OF History of paroxysmal ventricular tachycardia, none for quite some time. She has a Guidant ICD in place, will be undergoing generator change with Dr. Lovena Le in early June. Question remains based on LVEF as to whether this will be upgraded to biventricular device or not.    Satira Sark, M.D., F.A.C.C.

## 2013-09-13 NOTE — Progress Notes (Signed)
  Echocardiogram 2D Echocardiogram has been performed.  [name] [name] 09/13/2013, 4:03 PM

## 2013-09-13 NOTE — Assessment & Plan Note (Signed)
No active angina symptoms on medical therapy.

## 2013-09-13 NOTE — Assessment & Plan Note (Signed)
Reasonably stable at this point, weight is down. Records reviewed. She will be getting a followup echocardiogram later today for reassessment of LVEF.  No changes made to current regimen.

## 2013-09-13 NOTE — Assessment & Plan Note (Signed)
Continues on Coumadin, followup in the Coumadin clinic.

## 2013-09-13 NOTE — Patient Instructions (Signed)
Your physician recommends that you schedule a follow-up appointment in: 4 months     Your physician recommends that you continue on your current medications as directed. Please refer to the Current Medication list given to you today.      Thank you for choosing [facility] !         

## 2013-09-14 ENCOUNTER — Telehealth: Payer: Self-pay | Admitting: *Deleted

## 2013-09-14 NOTE — Telephone Encounter (Signed)
Message copied by Truett Mainland on Thu Sep 14, 2013  8:20 AM ------      Message from: Evans Lance      Created: Thu Sep 14, 2013  8:06 AM       Danelle Berry, let the patient know that her echo shows that her heart is strong and despite her shortness of breath, adding an extra pacing lead will not make her feel any better. Will plan to change out her device but not place a new lead. GT      ----- Message -----         From: [facility]         Sent: 09/13/2013   4:40 PM           To: Evans Lance, MD                   ------

## 2013-09-14 NOTE — Telephone Encounter (Signed)
Called cath lab and Told crystal Dr Lovena Le decided to plan a change out with pt device and will not place a new lead. Pt is made aware.

## 2013-09-20 ENCOUNTER — Other Ambulatory Visit: Payer: Self-pay | Admitting: Cardiology

## 2013-09-20 NOTE — Telephone Encounter (Signed)
Medication sent to pharmacy  

## 2013-09-22 LAB — PROTIME-INR
INR: 2.53 — ABNORMAL HIGH (ref <1.50)
Prothrombin Time: 26.4 seconds — ABNORMAL HIGH (ref 11.6–15.2)

## 2013-09-22 LAB — BASIC METABOLIC PANEL
BUN: 37 mg/dL — AB (ref 6–23)
CO2: 28 mEq/L (ref 19–32)
Calcium: 9.8 mg/dL (ref 8.4–10.5)
Chloride: 102 mEq/L (ref 96–112)
Creat: 1.06 mg/dL (ref 0.50–1.10)
GLUCOSE: 111 mg/dL — AB (ref 70–99)
Potassium: 4.6 mEq/L (ref 3.5–5.3)
Sodium: 141 mEq/L (ref 135–145)

## 2013-09-22 LAB — CBC WITH DIFFERENTIAL/PLATELET
BASOS ABS: 0 10*3/uL (ref 0.0–0.1)
BASOS PCT: 0 % (ref 0–1)
Eosinophils Absolute: 0.2 10*3/uL (ref 0.0–0.7)
Eosinophils Relative: 3 % (ref 0–5)
HCT: 37.7 % (ref 36.0–46.0)
Hemoglobin: 12.8 g/dL (ref 12.0–15.0)
LYMPHS PCT: 28 % (ref 12–46)
Lymphs Abs: 2.3 10*3/uL (ref 0.7–4.0)
MCH: 31.3 pg (ref 26.0–34.0)
MCHC: 34 g/dL (ref 30.0–36.0)
MCV: 92.2 fL (ref 78.0–100.0)
Monocytes Absolute: 0.8 10*3/uL (ref 0.1–1.0)
Monocytes Relative: 10 % (ref 3–12)
Neutro Abs: 4.8 10*3/uL (ref 1.7–7.7)
Neutrophils Relative %: 59 % (ref 43–77)
PLATELETS: 204 10*3/uL (ref 150–400)
RBC: 4.09 MIL/uL (ref 3.87–5.11)
RDW: 13.8 % (ref 11.5–15.5)
WBC: 8.1 10*3/uL (ref 4.0–10.5)

## 2013-09-22 LAB — APTT: aPTT: 37 seconds (ref 24–37)

## 2013-09-24 MED ORDER — SODIUM CHLORIDE 0.9 % IR SOLN
80.0000 mg | Status: DC
  Filled 2013-09-24: qty 2

## 2013-09-24 MED ORDER — CEFAZOLIN SODIUM-DEXTROSE 2-3 GM-% IV SOLR
2.0000 g | INTRAVENOUS | Status: DC
  Filled 2013-09-24: qty 50

## 2013-09-25 ENCOUNTER — Ambulatory Visit (HOSPITAL_COMMUNITY)
Admission: RE | Admit: 2013-09-25 | Discharge: 2013-09-25 | Disposition: A | Discharge status: Home / Self Care | Payer: Medicare Other | Source: Ambulatory Visit | Attending: Internal Medicine | Admitting: Internal Medicine

## 2013-09-25 ENCOUNTER — Encounter (HOSPITAL_COMMUNITY)
Admission: RE | Disposition: A | Discharge status: Home / Self Care | Payer: Self-pay | Source: Ambulatory Visit | Attending: Internal Medicine

## 2013-09-25 DIAGNOSIS — I472 Ventricular tachycardia, unspecified: Secondary | ICD-10-CM | POA: Insufficient documentation

## 2013-09-25 DIAGNOSIS — I10 Essential (primary) hypertension: Secondary | ICD-10-CM | POA: Insufficient documentation

## 2013-09-25 DIAGNOSIS — M1990 Unspecified osteoarthritis, unspecified site: Secondary | ICD-10-CM | POA: Insufficient documentation

## 2013-09-25 DIAGNOSIS — I2589 Other forms of chronic ischemic heart disease: Secondary | ICD-10-CM | POA: Insufficient documentation

## 2013-09-25 DIAGNOSIS — I4892 Unspecified atrial flutter: Secondary | ICD-10-CM | POA: Insufficient documentation

## 2013-09-25 DIAGNOSIS — E782 Mixed hyperlipidemia: Secondary | ICD-10-CM | POA: Insufficient documentation

## 2013-09-25 DIAGNOSIS — Z8674 Personal history of sudden cardiac arrest: Secondary | ICD-10-CM | POA: Insufficient documentation

## 2013-09-25 DIAGNOSIS — Z7982 Long term (current) use of aspirin: Secondary | ICD-10-CM | POA: Insufficient documentation

## 2013-09-25 DIAGNOSIS — I252 Old myocardial infarction: Secondary | ICD-10-CM | POA: Insufficient documentation

## 2013-09-25 DIAGNOSIS — Z951 Presence of aortocoronary bypass graft: Secondary | ICD-10-CM | POA: Insufficient documentation

## 2013-09-25 DIAGNOSIS — I4901 Ventricular fibrillation: Secondary | ICD-10-CM | POA: Insufficient documentation

## 2013-09-25 DIAGNOSIS — I2510 Atherosclerotic heart disease of native coronary artery without angina pectoris: Secondary | ICD-10-CM | POA: Insufficient documentation

## 2013-09-25 DIAGNOSIS — I4891 Unspecified atrial fibrillation: Secondary | ICD-10-CM | POA: Insufficient documentation

## 2013-09-25 DIAGNOSIS — Z4502 Encounter for adjustment and management of automatic implantable cardiac defibrillator: Secondary | ICD-10-CM | POA: Insufficient documentation

## 2013-09-25 DIAGNOSIS — I442 Atrioventricular block, complete: Secondary | ICD-10-CM | POA: Insufficient documentation

## 2013-09-25 DIAGNOSIS — I509 Heart failure, unspecified: Secondary | ICD-10-CM | POA: Insufficient documentation

## 2013-09-25 DIAGNOSIS — Z7901 Long term (current) use of anticoagulants: Secondary | ICD-10-CM | POA: Insufficient documentation

## 2013-09-25 DIAGNOSIS — I4729 Other ventricular tachycardia: Secondary | ICD-10-CM | POA: Insufficient documentation

## 2013-09-25 HISTORY — PX: IMPLANTABLE CARDIOVERTER DEFIBRILLATOR GENERATOR CHANGE: SHX5474

## 2013-09-25 LAB — SURGICAL PCR SCREEN
MRSA, PCR: NEGATIVE
Staphylococcus aureus: NEGATIVE

## 2013-09-25 LAB — PROTIME-INR
INR: 1.44 (ref 0.00–1.49)
Prothrombin Time: 17.2 seconds — ABNORMAL HIGH (ref 11.6–15.2)

## 2013-09-25 SURGERY — IMPLANTABLE CARDIOVERTER DEFIBRILLATOR GENERATOR CHANGE
Anesthesia: LOCAL

## 2013-09-25 MED ORDER — CHLORHEXIDINE GLUCONATE 4 % EX LIQD
60.0000 mL | Freq: Once | CUTANEOUS | Status: DC
  Filled 2013-09-25: qty 60

## 2013-09-25 MED ORDER — MUPIROCIN 2 % EX OINT
TOPICAL_OINTMENT | Freq: Two times a day (BID) | CUTANEOUS | Status: DC
  Administered 2013-09-25: 12:00:00 via NASAL
  Filled 2013-09-25: qty 22

## 2013-09-25 MED ORDER — ONDANSETRON HCL 4 MG/2ML IJ SOLN: 4.0000 mg | Freq: Four times a day (QID) | INTRAMUSCULAR | Status: DC | PRN

## 2013-09-25 MED ORDER — FENTANYL CITRATE 0.05 MG/ML IJ SOLN
INTRAMUSCULAR | Status: AC
  Filled 2013-09-25: qty 2

## 2013-09-25 MED ORDER — MUPIROCIN 2 % EX OINT
TOPICAL_OINTMENT | CUTANEOUS | Status: AC
  Filled 2013-09-25: qty 22

## 2013-09-25 MED ORDER — MIDAZOLAM HCL 5 MG/5ML IJ SOLN
INTRAMUSCULAR | Status: AC
  Filled 2013-09-25: qty 5

## 2013-09-25 MED ORDER — LIDOCAINE HCL (PF) 1 % IJ SOLN
INTRAMUSCULAR | Status: AC
  Filled 2013-09-25: qty 60

## 2013-09-25 MED ORDER — ACETAMINOPHEN 325 MG PO TABS
325.0000 mg | ORAL_TABLET | ORAL | Status: DC | PRN
  Filled 2013-09-25: qty 2

## 2013-09-25 MED ORDER — SODIUM CHLORIDE 0.9 % IV SOLN
INTRAVENOUS | Status: DC
  Administered 2013-09-25: 12:00:00 via INTRAVENOUS

## 2013-09-25 NOTE — H&P (Signed)
  ICD Criteria  Current LVEF:55% ;Obtained < 1 month ago.  NYHA Functional Classification: Class II  Heart Failure History:  Yes, Duration of heart failure since onset is > 9 months  Non-Ischemic Dilated Cardiomyopathy History: no  Atrial Fibrillation/Atrial Flutter:  yes  Ventricular Tachycardia History:  Yes, Hemodynamic instability present, VT Type:  SVT - Polymorphic.  Cardiac Arrest History:  No  History of Syndromes with Risk of Sudden Death:  No.  Previous ICD:  Yes, ICD Type:  Dual, Reason for ICD:  Secondary, reason for secondary prevention:  Ventricular Tachycardia  Electrophysiology Study: No.  Prior MI: yes  PPM: No.  OSA:  No  Patient Life Expectancy of >=1 year: Yes.  Anticoagulation Therapy:  Patient is on anticoagulation therapy, anticoagulation was held prior to procedure.   Beta Blocker Therapy:  Yes.   Ace Inhibitor/ARB Therapy:  Yes.

## 2013-09-25 NOTE — Interval H&P Note (Signed)
History and Physical Interval Note:  09/25/2013 1:12 PM  [name] [name]  has presented today for surgery, with the diagnosis of ERI  The various methods of treatment have been discussed with the patient and family. After consideration of risks, benefits and other options for treatment, the patient has consented to  Procedure(s): IMPLANTABLE CARDIOVERTER DEFIBRILLATOR GENERATOR CHANGE (N/A) as a surgical intervention .  The patient's history has been reviewed, patient examined, no change in status, stable for surgery.  I have reviewed the patient's chart and labs.  Questions were answered to the patient's satisfaction.     Norlene Duel.D.

## 2013-09-25 NOTE — Discharge Instructions (Signed)
Pacemaker Battery Change, Care After  Refer to this sheet in the next few weeks. These instructions provide you with information on caring for yourself after your procedure. Your health care provider may also give you more specific instructions. Your treatment has been planned according to current medical practices, but problems sometimes occur. Call your health care provider if you have any problems or questions after your procedure. WHAT TO EXPECT AFTER THE PROCEDURE After your procedure, it is typical to have the following sensations:  Soreness at the pacemaker site. HOME CARE INSTRUCTIONS   Keep the incision clean and dry.  Unless advised otherwise, you may shower beginning 48 hours after your procedure.  For the first week after the replacement, avoid stretching motions that pull at the incision site and avoid heavy exercise with the arm on the same side as the incision.  Only take over-the-counter or prescription medicines for pain, discomfort, or fever as directed by your health care provider.  Your health care provider will tell you when you will need to next test your pacemaker by telephone or when to return to the office for follow up for removal of stitches. SEEK MEDICAL CARE IF:   You have pain at the incision site that is not relieved by over-the-counter or prescription medicine.  There is drainage or pus from the incision site.  There is swelling larger than a lime at the incision site.  You develop red streaking that extends above or below the incision site.  You feel brief, intermittent palpitations, lightheadedness, or any symptoms that you feel might be related to your heart. SEEK IMMEDIATE MEDICAL CARE IF:   You experience chest pain that is different than the pain at the pacemaker site.  Shortness of breath.  Palpitations or irregular heart beat.  Lightheadedness that does not go away quickly.  Fainting.  You have pain that gets worse and is not relieved by  medicine. MAKE SURE YOU:   Understand these instructions.  Will watch your condition.  Will get help right away if you are not doing well or get worse. Document Released: 01/25/2013 Document Reviewed: 10/19/2012 [facility] Patient Information 2014 [place], [place].  RESUME COUMADIN TOMORROW 09/26/2013.

## 2013-09-25 NOTE — H&P (View-Only) (Signed)
HPI [name] [name] returns today for followup. She is a pleasant 80 yo woman with a h/o CAD, s/p CABG, CHB, s/p PPM who developed VF and syncope all documented by her PPM. She ultimately underwent insertion of an ICD and has done well. She has reached ERI on her device. No ICD shock. Over the past several months, she has had worsening of CHF. She was hospitalized with CHF in 12/14 and again in 3/15. She is now back to class 2 symptoms but she has pacing induced LBBB with a QRS duration of 180 ms. Suprisingly she has preserved LV function.  Allergies  Allergen Reactions  . Codeine Nausea And Vomiting     Current Outpatient Prescriptions  Medication Sig Dispense Refill  . acetaminophen (TYLENOL) 500 MG tablet Take 500 mg by mouth every 6 (six) hours as needed for pain.      [name] amLODipine-atorvastatin (CADUET) 5-20 MG per tablet Take 1 tablet by mouth at bedtime.      [name] aspirin EC 81 MG tablet Take 81 mg by mouth daily.      . Calcium Carbonate-Vitamin D (CALCIUM 600 + D PO) Take 1 tablet by mouth 2 (two) times daily.      . Cholecalciferol (VITAMIN D) 2000 UNITS CAPS Take 1 capsule by mouth at bedtime.       . folic acid (FOLVITE) 1 MG tablet Take 1 mg by mouth 2 (two) times daily.       . furosemide (LASIX) 20 MG tablet Take 1 tablet (20 mg total) by mouth 2 (two) times daily.  60 tablet  12  . losartan-hydrochlorothiazide (HYZAAR) 100-12.5 MG per tablet Take 1 tablet by mouth every morning.       . metoprolol (TOPROL-XL) 200 MG 24 hr tablet Take 200 mg by mouth every morning.       [name] Faster Glycol-Propyl Glycol (SYSTANE OP) Place 1 drop into both eyes daily as needed (Dry eyes).      . potassium chloride SA (K-DUR,KLOR-CON) 20 MEQ tablet Take 1 tablet (20 mEq total) by mouth 3 (three) times daily.  30 tablet  11  . warfarin (COUMADIN) 2.5 MG tablet Take 2 tablets daily except 1 tablet on Mondays, Wednesdays and Fridays  48 tablet  6   No current facility-administered medications for this visit.      Past Medical History  Diagnosis Date  . Osteoarthritis   . Mixed hyperlipidemia   . Coronary atherosclerosis of native coronary artery     Multivessel status post CABG, DES PLA March 2006  . Essential hypertension, benign   . Heart block   . Syncope   . Ventricular tachycardia     Status post ICD  . Ischemic cardiomyopathy     LVEF 55% as of March 2014  . Atrial fibrillation     ROS:   All systems reviewed and negative except as noted in the HPI.   Past Surgical History  Procedure Laterality Date  . Coronary artery bypass graft      LIMA to LAD, SVG to diagonal, SVG to ramus and OM  . Cardiac defibrillator placement      Guidant device  . Yag laser application Left 10/26/2954    Procedure: YAG LASER APPLICATION;  Surgeon: Elta Guadeloupe T. Gershon Crane, MD;  Location: [facility];  Service: Ophthalmology;  Laterality: Left;  . Yag laser application Right 06/03/863    Procedure: YAG LASER APPLICATION;  Surgeon: Elta Guadeloupe T. Gershon Crane, MD;  Location: [facility];  Service: Ophthalmology;  Laterality: Right;     Family History  Problem Relation Age of Onset  . Heart attack Father   . Heart attack Brother      History   Social History  . Marital Status: Widowed    Spouse Name: N/A    Number of Children: 2  . Years of Education: N/A   Occupational History  . Retired     Equities trader  .     Social History Main Topics  . Smoking status: Never Smoker   . Smokeless tobacco: Never Used  . Alcohol Use: No  . Drug Use: No  . Sexual Activity: No   Other Topics Concern  . Not on file   Social History Narrative  . No narrative on file     BP 140/72  Pulse 63  Ht 5\' 2"  (1.575 m)  Wt 164 lb (74.39 kg)  BMI 29.99 kg/m2  Physical Exam:  Well appearing 80 yo woman, NAD HEENT: Unremarkable Neck:  7 cm JVD, no thyromegally Lungs:  Clear with no wheezes, rales, or rhonchi HEART:  Regular rate rhythm, no murmurs, no rubs, no clicks Abd:  soft, positive bowel sounds, no  organomegally, no rebound, no guarding Ext:  2 plus pulses, no edema, no cyanosis, no clubbing Skin:  No rashes no nodules Neuro:  CN II through XII intact, motor grossly intact   DEVICE  Normal device function.  See PaceArt for details.   Assess/Plan:

## 2013-09-26 NOTE — CV Procedure (Signed)
Electrophysiology procedure note  Procedure: Removal of previously implanted ICD which had reached elective replacement, and insertion of a new ICD.  Indication: Status post ICD secondary to recurrent ventricular tachycardia/ventricular fibrillation cardiac arrest now with the current device at elective replacement  Description of the procedure: After informed consent was obtained, the patient was taken to the diagnostic electrophysiology laboratory in the fasting state. After the usual preparation and draping, intravenous Versed and fentanyl were utilized for sedation. 30 cc of lidocaine was infiltrated into the left infraclavicular region. A 5 cm incision was carried out and electrocautery was utilized to dissect down to the ICD pocket. The ICD was removed. The atrial and defibrillation leads were disconnected and analyzed. P waves measured 3 mV. Because of complete heart block, there were no R waves to be evaluated. The pacing impedance was 730 ohms in the atrium and 464 ohms in the ventricle. The pacing threshold was 0.5 V at 0.5 ms in the atrium, and 1 V at 0.5 ms in the ventricle. The new Medtronic dual-chamber ICD was connected to the old ICD lead and atrial lead and placed back in the subcutaneous pocket. The pocket was irrigated with antibiotic irrigation. The incision was closed with 2 layers of Vicryl suture. Benzoin and Steri-Strips for pain on the skin and a pressure dressing applied, and the patient returned to her room in satisfactory condition.   Complications: There were no immediate procedure complications  Conclusion: Successful removal of previously implanted dual chamber ICD which had reached elective replacement, and insertion of a new dual-chamber ICD in a patient with recurrent ventricular tachycardia and fibrillation.  [name], M.D.

## 2013-09-27 ENCOUNTER — Telehealth: Payer: Self-pay | Admitting: Cardiology

## 2013-09-27 ENCOUNTER — Emergency Department (HOSPITAL_COMMUNITY)
Admission: EM | Admit: 2013-09-27 | Discharge: 2013-09-27 | Disposition: A | Discharge status: Home / Self Care | Payer: Medicare Other

## 2013-09-27 NOTE — Telephone Encounter (Signed)
Dual chamber ICD placed on Monday. No reported complications. Pt calling stating that it is beeping. No shocks, no syncope, no issues. Called rep Tomi Bamberger) who was unable to remote interrogate from home. Pt needs to come to [facility] ER to ensure proper function, Tomi Bamberger to interrogate upon arrival (?able to do in waiting area) May need admission if serious malfunction.

## 2013-09-28 ENCOUNTER — Observation Stay (HOSPITAL_COMMUNITY)
Admission: RE | Admit: 2013-09-28 | Discharge: 2013-09-28 | Disposition: A | Discharge status: Home / Self Care | Payer: Medicare Other | Source: Other Acute Inpatient Hospital | Attending: Internal Medicine | Admitting: Internal Medicine

## 2013-09-28 ENCOUNTER — Other Ambulatory Visit: Payer: Self-pay | Admitting: Cardiology

## 2013-09-28 ENCOUNTER — Telehealth: Payer: Self-pay | Admitting: Internal Medicine

## 2013-09-28 ENCOUNTER — Observation Stay (HOSPITAL_COMMUNITY): Payer: Medicare Other

## 2013-09-28 ENCOUNTER — Encounter (HOSPITAL_COMMUNITY): Payer: Self-pay | Admitting: *Deleted

## 2013-09-28 ENCOUNTER — Encounter (HOSPITAL_COMMUNITY): Payer: Self-pay | Admitting: Emergency Medicine

## 2013-09-28 DIAGNOSIS — Z951 Presence of aortocoronary bypass graft: Secondary | ICD-10-CM | POA: Insufficient documentation

## 2013-09-28 DIAGNOSIS — Z95810 Presence of automatic (implantable) cardiac defibrillator: Secondary | ICD-10-CM

## 2013-09-28 DIAGNOSIS — Z7901 Long term (current) use of anticoagulants: Secondary | ICD-10-CM | POA: Insufficient documentation

## 2013-09-28 DIAGNOSIS — I5032 Chronic diastolic (congestive) heart failure: Secondary | ICD-10-CM

## 2013-09-28 DIAGNOSIS — C260 Malignant neoplasm of intestinal tract, part unspecified: Secondary | ICD-10-CM | POA: Insufficient documentation

## 2013-09-28 DIAGNOSIS — I442 Atrioventricular block, complete: Secondary | ICD-10-CM | POA: Insufficient documentation

## 2013-09-28 DIAGNOSIS — I4891 Unspecified atrial fibrillation: Secondary | ICD-10-CM | POA: Insufficient documentation

## 2013-09-28 DIAGNOSIS — Y838 Other surgical procedures as the cause of abnormal reaction of the patient, or of later complication, without mention of misadventure at the time of the procedure: Secondary | ICD-10-CM | POA: Insufficient documentation

## 2013-09-28 DIAGNOSIS — I2510 Atherosclerotic heart disease of native coronary artery without angina pectoris: Secondary | ICD-10-CM | POA: Insufficient documentation

## 2013-09-28 DIAGNOSIS — I472 Ventricular tachycardia, unspecified: Secondary | ICD-10-CM | POA: Insufficient documentation

## 2013-09-28 DIAGNOSIS — R0602 Shortness of breath: Secondary | ICD-10-CM

## 2013-09-28 DIAGNOSIS — Z950 Presence of cardiac pacemaker: Secondary | ICD-10-CM | POA: Insufficient documentation

## 2013-09-28 DIAGNOSIS — I10 Essential (primary) hypertension: Secondary | ICD-10-CM | POA: Insufficient documentation

## 2013-09-28 DIAGNOSIS — Z9861 Coronary angioplasty status: Secondary | ICD-10-CM | POA: Insufficient documentation

## 2013-09-28 DIAGNOSIS — J189 Pneumonia, unspecified organism: Secondary | ICD-10-CM

## 2013-09-28 DIAGNOSIS — E782 Mixed hyperlipidemia: Secondary | ICD-10-CM | POA: Insufficient documentation

## 2013-09-28 DIAGNOSIS — T82198A Other mechanical complication of other cardiac electronic device, initial encounter: Principal | ICD-10-CM | POA: Insufficient documentation

## 2013-09-28 DIAGNOSIS — T829XXA Unspecified complication of cardiac and vascular prosthetic device, implant and graft, initial encounter: Secondary | ICD-10-CM | POA: Diagnosis present

## 2013-09-28 DIAGNOSIS — M1990 Unspecified osteoarthritis, unspecified site: Secondary | ICD-10-CM | POA: Insufficient documentation

## 2013-09-28 DIAGNOSIS — I4729 Other ventricular tachycardia: Secondary | ICD-10-CM | POA: Insufficient documentation

## 2013-09-28 DIAGNOSIS — I509 Heart failure, unspecified: Secondary | ICD-10-CM | POA: Insufficient documentation

## 2013-09-28 DIAGNOSIS — I2589 Other forms of chronic ischemic heart disease: Secondary | ICD-10-CM | POA: Insufficient documentation

## 2013-09-28 DIAGNOSIS — Y831 Surgical operation with implant of artificial internal device as the cause of abnormal reaction of the patient, or of later complication, without mention of misadventure at the time of the procedure: Secondary | ICD-10-CM | POA: Insufficient documentation

## 2013-09-28 DIAGNOSIS — T82190A Other mechanical complication of cardiac electrode, initial encounter: Principal | ICD-10-CM | POA: Insufficient documentation

## 2013-09-28 DIAGNOSIS — Z7982 Long term (current) use of aspirin: Secondary | ICD-10-CM | POA: Insufficient documentation

## 2013-09-28 DIAGNOSIS — I350 Nonrheumatic aortic (valve) stenosis: Secondary | ICD-10-CM

## 2013-09-28 DIAGNOSIS — Z5181 Encounter for therapeutic drug level monitoring: Secondary | ICD-10-CM

## 2013-09-28 LAB — CBC
HCT: 34.2 % — ABNORMAL LOW (ref 36.0–46.0)
Hemoglobin: 11.3 g/dL — ABNORMAL LOW (ref 12.0–15.0)
MCH: 30.4 pg (ref 26.0–34.0)
MCHC: 33 g/dL (ref 30.0–36.0)
MCV: 91.9 fL (ref 78.0–100.0)
PLATELETS: 162 10*3/uL (ref 150–400)
RBC: 3.72 MIL/uL — ABNORMAL LOW (ref 3.87–5.11)
RDW: 13.8 % (ref 11.5–15.5)
WBC: 8.9 10*3/uL (ref 4.0–10.5)

## 2013-09-28 LAB — PROTIME-INR
INR: 1.26 (ref 0.00–1.49)
PROTHROMBIN TIME: 15.5 s — AB (ref 11.6–15.2)

## 2013-09-28 LAB — BASIC METABOLIC PANEL
BUN: 31 mg/dL — ABNORMAL HIGH (ref 6–23)
CHLORIDE: 102 meq/L (ref 96–112)
CO2: 24 mEq/L (ref 19–32)
Calcium: 9.1 mg/dL (ref 8.4–10.5)
Creatinine, Ser: 1.16 mg/dL — ABNORMAL HIGH (ref 0.50–1.10)
GFR calc Af Amer: 51 mL/min — ABNORMAL LOW (ref >90)
GFR calc non Af Amer: 44 mL/min — ABNORMAL LOW (ref >90)
Glucose, Bld: 97 mg/dL (ref 70–99)
POTASSIUM: 4.2 meq/L (ref 3.7–5.3)
Sodium: 142 mEq/L (ref 137–147)

## 2013-09-28 MED ORDER — POTASSIUM CHLORIDE CRYS ER 20 MEQ PO TBCR
20.0000 meq | EXTENDED_RELEASE_TABLET | Freq: Three times a day (TID) | ORAL | Status: DC
  Administered 2013-09-28: 20 meq via ORAL
  Filled 2013-09-28 (×3): qty 1

## 2013-09-28 MED ORDER — ATORVASTATIN CALCIUM 20 MG PO TABS
20.0000 mg | ORAL_TABLET | Freq: Every day | ORAL | Status: DC
  Filled 2013-09-28: qty 1

## 2013-09-28 MED ORDER — LOSARTAN POTASSIUM-HCTZ 100-12.5 MG PO TABS: 1.0000 | ORAL_TABLET | Freq: Every morning | ORAL | Status: DC

## 2013-09-28 MED ORDER — WARFARIN SODIUM 5 MG PO TABS
5.0000 mg | ORAL_TABLET | Freq: Once | ORAL | Status: DC
  Filled 2013-09-28: qty 1

## 2013-09-28 MED ORDER — SODIUM CHLORIDE 0.9 % IV SOLN: 250.0000 mL | INTRAVENOUS | Status: DC | PRN

## 2013-09-28 MED ORDER — WARFARIN - PHARMACIST DOSING INPATIENT: Freq: Every day | Status: DC

## 2013-09-28 MED ORDER — ASPIRIN EC 81 MG PO TBEC
81.0000 mg | DELAYED_RELEASE_TABLET | Freq: Every day | ORAL | Status: DC
  Administered 2013-09-28: 81 mg via ORAL
  Filled 2013-09-28: qty 1

## 2013-09-28 MED ORDER — METOPROLOL SUCCINATE ER 100 MG PO TB24
200.0000 mg | ORAL_TABLET | Freq: Every day | ORAL | Status: DC
  Administered 2013-09-28: 200 mg via ORAL
  Filled 2013-09-28: qty 2

## 2013-09-28 MED ORDER — SODIUM CHLORIDE 0.9 % IJ SOLN: 3.0000 mL | Freq: Two times a day (BID) | INTRAMUSCULAR | Status: DC

## 2013-09-28 MED ORDER — ACETAMINOPHEN 500 MG PO TABS: 500.0000 mg | ORAL_TABLET | Freq: Four times a day (QID) | ORAL | Status: DC | PRN

## 2013-09-28 MED ORDER — AMLODIPINE BESYLATE 5 MG PO TABS
5.0000 mg | ORAL_TABLET | Freq: Every day | ORAL | Status: DC
  Filled 2013-09-28: qty 1

## 2013-09-28 MED ORDER — LOSARTAN POTASSIUM 50 MG PO TABS
100.0000 mg | ORAL_TABLET | Freq: Every day | ORAL | Status: DC
  Administered 2013-09-28: 100 mg via ORAL
  Filled 2013-09-28: qty 2

## 2013-09-28 MED ORDER — AMLODIPINE-ATORVASTATIN 5-20 MG PO TABS: 1.0000 | ORAL_TABLET | Freq: Every day | ORAL | Status: DC

## 2013-09-28 MED ORDER — FUROSEMIDE 20 MG PO TABS
20.0000 mg | ORAL_TABLET | Freq: Two times a day (BID) | ORAL | Status: DC
Start: 2013-09-28 — End: 2013-09-28
  Administered 2013-09-28: 20 mg via ORAL
  Filled 2013-09-28 (×3): qty 1

## 2013-09-28 MED ORDER — HYDROCHLOROTHIAZIDE 12.5 MG PO CAPS
12.5000 mg | ORAL_CAPSULE | Freq: Every day | ORAL | Status: DC
  Administered 2013-09-28: 12.5 mg via ORAL
  Filled 2013-09-28: qty 1

## 2013-09-28 MED ORDER — SODIUM CHLORIDE 0.9 % IJ SOLN: 3.0000 mL | INTRAMUSCULAR | Status: DC | PRN

## 2013-09-28 MED ORDER — SODIUM CHLORIDE 0.9 % IJ SOLN
3.0000 mL | Freq: Two times a day (BID) | INTRAMUSCULAR | Status: DC
  Administered 2013-09-28: 3 mL via INTRAVENOUS

## 2013-09-28 NOTE — Progress Notes (Signed)
Pt discharge home. Discharge instructions reviewed. Pt informed of procedure in 1 week and dr. Visits. Pt vu. enocuraged pt to call with concerns

## 2013-09-28 NOTE — ED Notes (Addendum)
The patient was seen here yesterday due to her pacemaker making a loud noise.  It did not shock her but it made a loud noise after it was replaced on Monday.  She was admitted yesterday and they interrogated the defibrillator/pacemaker and they sent her home this afternoon.  The MD advised her to come back if it "beeped" again.  The patient said the device made a faint beep but it did not shock her or anything else.  She denies any symptoms but came in because she did not know what else to do.  The device is a MedTronic device.

## 2013-09-28 NOTE — Telephone Encounter (Signed)
See prior telephone note

## 2013-09-28 NOTE — Progress Notes (Signed)
Patient: [name] [name] Date of Encounter: 09/28/2013, 7:39 AM Admit date: 09/28/2013     Subjective  [name] [name] has no complaints this AM.   Objective  Physical Exam: Vitals: BP 114/75  Pulse 60  Temp(Src) 97.8 F (36.6 C) (Oral)  Resp 18  Ht 5\' 1"  (1.549 m)  Wt 165 lb 12.6 oz (75.2 kg)  BMI 31.34 kg/m2  SpO2 96% General: Well developed, well appearing 80 year old female in no acute distress. Neck: Supple. JVD not elevated. Lungs: Clear bilaterally to auscultation without wheezes, rales, or rhonchi. Breathing is unlabored. Heart: Regular S1 S2 without murmurs, rubs, or gallops.  Abdomen: Soft, non-distended. Extremities: No clubbing or cyanosis. No edema.  Distal pedal pulses are 2+ and equal bilaterally. Neuro: Alert and oriented X 3. Moves all extremities spontaneously. No focal deficits. Skin: Left upper chest / implant site intact without bleeding or hematoma.  Intake/Output: No intake or output data in the 24 hours ending 09/28/13 0739  Inpatient Medications:  . amLODipine  5 mg Oral Daily  . aspirin EC  81 mg Oral Daily  . atorvastatin  20 mg Oral q1800  . furosemide  20 mg Oral BID  . hydrochlorothiazide  12.5 mg Oral Daily  . losartan  100 mg Oral Daily  . metoprolol  200 mg Oral Daily  . potassium chloride SA  20 mEq Oral TID  . sodium chloride  3 mL Intravenous Q12H  . sodium chloride  3 mL Intravenous Q12H  . warfarin  5 mg Oral ONCE-1800  . Warfarin - Pharmacist Dosing Inpatient   Does not apply q1800    Labs:  Recent Labs  09/28/13 0435  NA 142  K 4.2  CL 102  CO2 24  GLUCOSE 97  BUN 31*  CREATININE 1.16*  CALCIUM 9.1    Recent Labs  09/28/13 0435  WBC 8.9  HGB 11.3*  HCT 34.2*  MCV 91.9  PLT 162    Recent Labs  09/28/13 0435  INR 1.26    Radiology/Studies: Chest x-ray pending this AM Echocardiogram 09/13/2013 Study Conclusions - Left ventricle: The cavity size was normal. Wall thickness was increased in a pattern of mild  LVH. Systolic function was normal. The estimated ejection fraction was in the range of 60% to 65%. Wall motion was normal; there were no regional wall motion abnormalities. Features are consistent with a pseudonormal left ventricular filling pattern, with concomitant abnormal relaxation and increased filling pressure (grade 2 diastolic dysfunction). Doppler parameters are consistent with elevated ventricular end-diastolic filling pressure. - Aortic valve: Mildly calcified annulus. Trileaflet; mildly calcified leaflets. There was very mild stenosis. There was no significant regurgitation. Mean gradient (S): 9 mm Hg. Valve area (VTI): 1.52 cm^2. Valve area (Vmax): 1.71 cm^2. - Mitral valve: Calcified annulus. Mildly thickened leaflets . There was mild regurgitation. - Left atrium: The atrium was mildly to moderately dilated. - Right ventricle: The cavity size was mildly dilated. Pacer wire or catheter noted in right ventricle. - Right atrium: Central venous pressure (est): 3 mm Hg. - Atrial septum: No defect or patent foramen ovale was identified. - Tricuspid valve: There was mild regurgitation. - Pulmonary arteries: PA peak pressure: 42 mm Hg (S). - Pericardium, extracardiac: There was no pericardial effusion. Impressions: - Mild LVH with LVEF 34-74%, grade 2 diastolic dysfunction with increased filling pressures. MIld to moderate left atrial enlargement. Mild mitral regurgitation. Very mildly stenotic aortic valve. Device wire noted in right heart. Mild tricuspid regurgitation with PASP  42 mmHg.  Telemetry: AV paced   Assessment and Plan  [name] [name] is a 80 year old woman with prior VT/VF arrest s/p ICD, complete heart block, CAD s/p CABG and AFib who presented overnight to [facility] with complaints of her device beeping --> interrogation in ED revealed increased impedance and threshold on RV lead.   RV lead malfunction Recent ICD generator change Prior VT/VF arrest s/p  ICD implant Complete heart block  Signed, EDMISTEN, BROOKE PA-C  EP Attending  Patient well known to me came in last night with alert and found to have an increased pacing impedence. Her threshold also up. She has CHB. Today her numbers are stable and we will let her go home with increased pacing output and schedule her to come back next week for lead revision.   Mikle Bosworth.D.

## 2013-09-28 NOTE — H&P (Signed)
History and Physical  Patient ID: [name] [name] MRN: [id number], SOB: [birth year] 80 y.o. Date of Encounter: 09/28/2013, 12:29 AM  Primary Physician: Alonza Bogus, MD Primary Cardiologist: Dr. Lovena Le  Chief Complaint: pacemaker/ICD beeping  HPI: 80 y.o. female w/ PMHx significant for Vtach s/p ICD, heart block requiring pacer, CAD s/p CABG, afib who presented to [facility] on 09/28/2013 with complaints of her device beeping. Underwent device replacement this past Monday for ERI with Medtronic dual chamber ICD. No complications per report or per patient.   Notes indidcate RV impedance of 464 though device impedance of 665 according to scanned in sheets. Threshold though of 2. V @ 0.5 ms.  Uneventful recovery at home. No chest pain, no SOB, no shocks, no syncope. This morning, heard beeping which she finally located to her device. Called and was interrogated in triage by [employer] rep, who determined alarm was for increased impedance in RV lead to 1080. Threshold also increased to 2.75.  Admitted to evaluate for lead malfunction.   EKG pending. CXR pending. Labs pending.   Past Medical History  Diagnosis Date  . Osteoarthritis   . Mixed hyperlipidemia   . Coronary atherosclerosis of native coronary artery     Multivessel status post CABG, DES PLA March 2006  . Essential hypertension, benign   . Heart block   . Syncope   . Ventricular tachycardia     Status post ICD  . Ischemic cardiomyopathy     LVEF 55% as of March 2014  . Atrial fibrillation      Surgical History:  Past Surgical History  Procedure Laterality Date  . Coronary artery bypass graft      LIMA to LAD, SVG to diagonal, SVG to ramus and OM  . Cardiac defibrillator placement      Guidant device  . Yag laser application Left 04/24/4006    Procedure: YAG LASER APPLICATION;  Surgeon: Elta Guadeloupe T. Gershon Crane, MD;  Location: [facility];  Service: Ophthalmology;  Laterality: Left;  . Yag laser application Right  6/76/1950    Procedure: YAG LASER APPLICATION;  Surgeon: Elta Guadeloupe T. Gershon Crane, MD;  Location: [facility];  Service: Ophthalmology;  Laterality: Right;     Home Meds: Prior to Admission medications   Medication Sig Start Date End Date Taking? Authorizing Provider  acetaminophen (TYLENOL) 500 MG tablet Take 500 mg by mouth every 6 (six) hours as needed for pain.    Historical Provider, MD  amLODipine-atorvastatin (CADUET) 5-20 MG per tablet TAKE ONE TABLET BY MOUTH ONCE DAILY. 09/20/13   Satira Sark, MD  aspirin EC 81 MG tablet Take 81 mg by mouth daily.    Historical Provider, MD  Calcium Carbonate-Vitamin D (CALCIUM 600 + D PO) Take 1 tablet by mouth 2 (two) times daily.    Historical Provider, MD  Cholecalciferol (VITAMIN D) 2000 UNITS CAPS Take 1 capsule by mouth at bedtime.     Historical Provider, MD  folic acid (FOLVITE) 1 MG tablet Take 1 mg by mouth 2 (two) times daily.     Historical Provider, MD  furosemide (LASIX) 20 MG tablet Take 1 tablet (20 mg total) by mouth 2 (two) times daily. 04/13/13   Alonza Bogus, MD  losartan-hydrochlorothiazide (HYZAAR) 100-12.5 MG per tablet Take 1 tablet by mouth every morning.  03/29/13   Historical Provider, MD  metoprolol (TOPROL-XL) 200 MG 24 hr tablet Take 200 mg by mouth every morning.     Historical Provider, MD  Polyethyl Glycol-Propyl Glycol (SYSTANE  OP) Place 1 drop into both eyes daily as needed (Dry eyes).    Historical Provider, MD  potassium chloride SA (K-DUR,KLOR-CON) 20 MEQ tablet Take 1 tablet (20 mEq total) by mouth 3 (three) times daily. 04/13/13 04/13/14  Alonza Bogus, MD  warfarin (COUMADIN) 2.5 MG tablet Take 2 tablets daily except 1 tablet on Mondays, Wednesdays and Fridays 08/09/13   Satira Sark, MD    Allergies:  Allergies  Allergen Reactions  . Codeine Nausea And Vomiting    History   Social History  . Marital Status: Widowed    Spouse Name: N/A    Number of Children: 2  . Years of Education: N/A    Occupational History  . Retired     Equities trader  .     Social History Main Topics  . Smoking status: Never Smoker   . Smokeless tobacco: Never Used  . Alcohol Use: No  . Drug Use: No  . Sexual Activity: No   Other Topics Concern  . Not on file   Social History Narrative  . No narrative on file     Family History  Problem Relation Age of Onset  . Heart attack Father   . Heart attack Brother     Review of Systems: General: negative for chills, fever, night sweats or weight changes.  Cardiovascular: see HPI Dermatological: negative for rash Respiratory: negative for cough or wheezing Urologic: negative for hematuria Abdominal: negative for nausea, vomiting, diarrhea, bright red blood per rectum, melena, or hematemesis Neurologic: negative for visual changes, syncope, or dizziness All other systems reviewed and are otherwise negative except as noted above.  Labs:   Lab Results  Component Value Date   WBC 8.1 09/22/2013   HGB 12.8 09/22/2013   HCT 37.7 09/22/2013   MCV 92.2 09/22/2013   PLT 204 09/22/2013    Recent Labs Lab 09/22/13 1004  NA 141  K 4.6  CL 102  CO2 28  BUN 37*  CREATININE 1.06  CALCIUM 9.8  GLUCOSE 111*   No results found for this basename: CKTOTAL, CKMB, TROPONINI,  in the last 72 hours Lab Results  Component Value Date   CHOL 111 01/24/2013   HDL 31* 01/24/2013   LDLCALC 56 01/24/2013   TRIG 119 01/24/2013   Lab Results  Component Value Date   DDIMER <0.27 04/11/2013    Radiology/Studies:  Dg Chest 2 View  08/29/2013   CLINICAL DATA:  PNEUMONIA  EXAM: CHEST  2 VIEW  COMPARISON:  Single view chest 07/19/2013  FINDINGS: Cardiac silhouette within the upper limits normal and otherwise stable. Left chest wall dual chamber cardiac pacing unit stable. Lungs are clear. No acute osseous abnormalities.  IMPRESSION: No active cardiopulmonary disease.   Electronically Signed   By: Margaree Mackintosh M.D.   On: 08/29/2013 11:40     EKG: pending.    Physical Exam: There were no vitals taken for this visit. General: Well developed, well nourished, in no acute distress. Head: Normocephalic, atraumatic, sclera non-icteric, nares are without discharge Neck: Supple. Negative for carotid bruits. JVD not elevated. Lungs: Clear bilaterally to auscultation without wheezes, rales, or rhonchi. Breathing is unlabored. Heart: RRR with S1 S2. No murmurs, rubs, or gallops appreciated. Pacer site c/d/i, no swelling Abdomen: Soft, non-tender, non-distended with normoactive bowel sounds. No rebound/guarding. No obvious abdominal masses. Msk:  Strength and tone appear normal for age. Extremities: No edema. No clubbing or cyanosis. Distal pedal pulses are 2+ and equal bilaterally. Neuro: Alert  and oriented X 3. Moves all extremities spontaneously. Psych:  Responds to questions appropriately with a normal affect.    ASSESSMENT AND PLAN:  80 y.o. female w/ PMHx significant for Vtach s/p ICD, heart block requiring pacer, CAD s/p CABG, afib who presented to [facility] on 09/28/2013 with complaints of her device beeping --> increased impedance and threshold on her RV lead.  Admitted for observation and consideration of lead adjustment. EP to see. Dr. Lovena Le aware. 2 view Cxray to evaluate lead placement. NPO in case procedure is needed. Continue current medications including anticoagulation.   Full code.  Signed, Elias Else, Myndi Wamble C. MD 09/28/2013, 12:29 AM

## 2013-09-28 NOTE — Progress Notes (Signed)
Entered in error

## 2013-09-28 NOTE — Telephone Encounter (Signed)
[name] [name] called after being discharged today. She said she was told to come back if she had further ICD alarms. She started alarming again prompting call. I reviewed chart briefly and she had elevated impedance with planned RV lead revision. Given continued beeping, recommended that [name] [name] follow plan stated at discharge to return if she continued to have alarming. She told me she was going to come into the hospital because no one was staying at her home all night.

## 2013-09-28 NOTE — Progress Notes (Signed)
ANTICOAGULATION CONSULT NOTE - Initial Consult  Pharmacy Consult for Warfarin  Indication: atrial fibrillation  Allergies  Allergen Reactions  . Codeine Nausea And Vomiting    Patient Measurements: Height: 5\' 1"  (154.9 cm) Weight: 165 lb 12.6 oz (75.2 kg) IBW/kg (Calculated) : 47.8  Vital Signs: Temp: 98.6 F (37 C) (06/11 0023) Temp src: Oral (06/11 0023) BP: 139/76 mmHg (06/11 0023) Pulse Rate: 61 (06/11 0023)  Labs:  Recent Labs  09/25/13 1150 09/28/13 0435  HGB  --  11.3*  HCT  --  34.2*  PLT  --  162  LABPROT 17.2*  --   INR 1.44  --   CREATININE  --  1.16*   Estimated Creatinine Clearance: 36.5 ml/min (by C-G formula based on Cr of 1.16).  Medical History: Past Medical History  Diagnosis Date  . Osteoarthritis   . Mixed hyperlipidemia   . Coronary atherosclerosis of native coronary artery     Multivessel status post CABG, DES PLA March 2006  . Essential hypertension, benign   . Heart block   . Syncope   . Ventricular tachycardia     Status post ICD  . Ischemic cardiomyopathy     LVEF 55% as of March 2014  . Atrial fibrillation    Medications:  Warfarin PTA dose from outpatient anti-coag visit: 2.5mg  MWF, 5mg  all other days  Assessment: 80 y/o F on warfarin PTA for afib. Warfarin was just recently re-started after holding for changing out ICD. INR this AM is 1.26. Hgb 11.3, other labs as above.   Goal of Therapy:  INR 2-3 Monitor platelets by anticoagulation protocol: Yes   Plan:  -Warfarin 5mg  PO x 1 at 1800 -Daily PT/INR -Monitor for bleeding  Narda Bonds 09/28/2013,5:44 AM

## 2013-09-28 NOTE — Discharge Instructions (Signed)
Please arrive at [facility] on Friday, October 09, 2013 at 9:30 AM for RV lead revision with Dr. [name]. DO NOT eat or drink anything after midnight the night before your procedure.  If you hear your device beeping again, please come to [facility] for evaluation.

## 2013-09-29 ENCOUNTER — Encounter (HOSPITAL_COMMUNITY)
Admission: EM | Disposition: A | Discharge status: Home / Self Care | Payer: Self-pay | Source: Home / Self Care | Attending: Emergency Medicine

## 2013-09-29 ENCOUNTER — Encounter (HOSPITAL_COMMUNITY): Payer: Self-pay | Admitting: Internal Medicine

## 2013-09-29 ENCOUNTER — Observation Stay (HOSPITAL_COMMUNITY)
Admission: EM | Admit: 2013-09-29 | Discharge: 2013-09-30 | Disposition: A | Discharge status: Home / Self Care | Payer: Medicare Other | Attending: Internal Medicine | Admitting: Internal Medicine

## 2013-09-29 DIAGNOSIS — I10 Essential (primary) hypertension: Secondary | ICD-10-CM | POA: Diagnosis present

## 2013-09-29 DIAGNOSIS — I4891 Unspecified atrial fibrillation: Secondary | ICD-10-CM | POA: Diagnosis present

## 2013-09-29 DIAGNOSIS — Z7901 Long term (current) use of anticoagulants: Secondary | ICD-10-CM

## 2013-09-29 DIAGNOSIS — I442 Atrioventricular block, complete: Secondary | ICD-10-CM

## 2013-09-29 DIAGNOSIS — I2510 Atherosclerotic heart disease of native coronary artery without angina pectoris: Secondary | ICD-10-CM | POA: Diagnosis present

## 2013-09-29 DIAGNOSIS — T82190A Other mechanical complication of cardiac electrode, initial encounter: Secondary | ICD-10-CM

## 2013-09-29 DIAGNOSIS — I5032 Chronic diastolic (congestive) heart failure: Secondary | ICD-10-CM | POA: Diagnosis present

## 2013-09-29 DIAGNOSIS — T829XXA Unspecified complication of cardiac and vascular prosthetic device, implant and graft, initial encounter: Secondary | ICD-10-CM | POA: Diagnosis present

## 2013-09-29 HISTORY — PX: LEAD REVISION: SHX5945

## 2013-09-29 LAB — BASIC METABOLIC PANEL
BUN: 36 mg/dL — ABNORMAL HIGH (ref 6–23)
CALCIUM: 9.5 mg/dL (ref 8.4–10.5)
CO2: 22 mEq/L (ref 19–32)
Chloride: 107 mEq/L (ref 96–112)
Creatinine, Ser: 1.02 mg/dL (ref 0.50–1.10)
GFR calc Af Amer: 59 mL/min — ABNORMAL LOW (ref >90)
GFR, EST NON AFRICAN AMERICAN: 51 mL/min — AB (ref >90)
Glucose, Bld: 111 mg/dL — ABNORMAL HIGH (ref 70–99)
Potassium: 4.2 mEq/L (ref 3.7–5.3)
SODIUM: 145 meq/L (ref 137–147)

## 2013-09-29 LAB — CBC
HCT: 36.4 % (ref 36.0–46.0)
Hemoglobin: 12.2 g/dL (ref 12.0–15.0)
MCH: 31.1 pg (ref 26.0–34.0)
MCHC: 33.5 g/dL (ref 30.0–36.0)
MCV: 92.9 fL (ref 78.0–100.0)
PLATELETS: 161 10*3/uL (ref 150–400)
RBC: 3.92 MIL/uL (ref 3.87–5.11)
RDW: 13.8 % (ref 11.5–15.5)
WBC: 7.8 10*3/uL (ref 4.0–10.5)

## 2013-09-29 LAB — PROTIME-INR
INR: 1.36 (ref 0.00–1.49)
Prothrombin Time: 16.4 seconds — ABNORMAL HIGH (ref 11.6–15.2)

## 2013-09-29 SURGERY — LEAD REVISION
Anesthesia: LOCAL

## 2013-09-29 MED ORDER — HEPARIN (PORCINE) IN NACL 2-0.9 UNIT/ML-% IJ SOLN
INTRAMUSCULAR | Status: AC
  Filled 2013-09-29: qty 500

## 2013-09-29 MED ORDER — ASPIRIN EC 81 MG PO TBEC
81.0000 mg | DELAYED_RELEASE_TABLET | Freq: Every day | ORAL | Status: DC
  Administered 2013-09-30: 81 mg via ORAL
  Filled 2013-09-29 (×2): qty 1

## 2013-09-29 MED ORDER — LIDOCAINE HCL (PF) 1 % IJ SOLN
INTRAMUSCULAR | Status: AC
  Filled 2013-09-29: qty 60

## 2013-09-29 MED ORDER — ONDANSETRON HCL 4 MG/2ML IJ SOLN
INTRAMUSCULAR | Status: AC
  Filled 2013-09-29: qty 2

## 2013-09-29 MED ORDER — SODIUM CHLORIDE 0.9 % IJ SOLN: 3.0000 mL | INTRAMUSCULAR | Status: DC | PRN

## 2013-09-29 MED ORDER — METOPROLOL SUCCINATE ER 100 MG PO TB24
200.0000 mg | ORAL_TABLET | Freq: Every morning | ORAL | Status: DC
  Administered 2013-09-29 – 2013-09-30 (×2): 200 mg via ORAL
  Filled 2013-09-29 (×2): qty 2

## 2013-09-29 MED ORDER — POTASSIUM CHLORIDE CRYS ER 20 MEQ PO TBCR
20.0000 meq | EXTENDED_RELEASE_TABLET | Freq: Three times a day (TID) | ORAL | Status: DC
  Administered 2013-09-29 – 2013-09-30 (×4): 20 meq via ORAL
  Filled 2013-09-29 (×4): qty 1

## 2013-09-29 MED ORDER — ATORVASTATIN CALCIUM 20 MG PO TABS
20.0000 mg | ORAL_TABLET | Freq: Every day | ORAL | Status: DC
  Administered 2013-09-29: 20 mg via ORAL
  Filled 2013-09-29 (×2): qty 1

## 2013-09-29 MED ORDER — AMLODIPINE BESYLATE 5 MG PO TABS
5.0000 mg | ORAL_TABLET | Freq: Every day | ORAL | Status: DC
  Filled 2013-09-29 (×2): qty 1

## 2013-09-29 MED ORDER — WARFARIN - PHARMACIST DOSING INPATIENT: Freq: Every day | Status: DC

## 2013-09-29 MED ORDER — FUROSEMIDE 20 MG PO TABS
20.0000 mg | ORAL_TABLET | Freq: Two times a day (BID) | ORAL | Status: DC
  Administered 2013-09-29 – 2013-09-30 (×2): 20 mg via ORAL
  Filled 2013-09-29 (×5): qty 1

## 2013-09-29 MED ORDER — SODIUM CHLORIDE 0.9 % IV SOLN
INTRAVENOUS | Status: AC
  Administered 2013-09-29: 16:00:00 via INTRAVENOUS

## 2013-09-29 MED ORDER — WARFARIN SODIUM 2.5 MG PO TABS: 2.5000 mg | ORAL_TABLET | ORAL | Status: DC

## 2013-09-29 MED ORDER — WARFARIN SODIUM 5 MG PO TABS
5.0000 mg | ORAL_TABLET | Freq: Once | ORAL | Status: AC
  Administered 2013-09-29: 5 mg via ORAL
  Filled 2013-09-29: qty 1

## 2013-09-29 MED ORDER — ACETAMINOPHEN 325 MG PO TABS: 325.0000 mg | ORAL_TABLET | ORAL | Status: DC | PRN

## 2013-09-29 MED ORDER — ONDANSETRON HCL 4 MG/2ML IJ SOLN: 4.0000 mg | Freq: Four times a day (QID) | INTRAMUSCULAR | Status: DC | PRN

## 2013-09-29 MED ORDER — SODIUM CHLORIDE 0.45 % IV BOLUS
100.0000 mL | Freq: Once | INTRAVENOUS | Status: AC
  Administered 2013-09-29: 100 mL via INTRAVENOUS

## 2013-09-29 MED ORDER — SODIUM CHLORIDE 0.9 % IR SOLN
80.0000 mg | Status: DC
  Filled 2013-09-29: qty 2

## 2013-09-29 MED ORDER — SODIUM CHLORIDE 0.9 % IV SOLN: 250.0000 mL | INTRAVENOUS | Status: DC

## 2013-09-29 MED ORDER — FENTANYL CITRATE 0.05 MG/ML IJ SOLN
INTRAMUSCULAR | Status: AC
  Filled 2013-09-29: qty 2

## 2013-09-29 MED ORDER — CEFAZOLIN SODIUM-DEXTROSE 2-3 GM-% IV SOLR
2.0000 g | INTRAVENOUS | Status: DC
  Filled 2013-09-29: qty 50

## 2013-09-29 MED ORDER — FOLIC ACID 1 MG PO TABS
1.0000 mg | ORAL_TABLET | Freq: Two times a day (BID) | ORAL | Status: DC
  Administered 2013-09-29 – 2013-09-30 (×2): 1 mg via ORAL
  Filled 2013-09-29 (×4): qty 1

## 2013-09-29 MED ORDER — HYDROCHLOROTHIAZIDE 12.5 MG PO CAPS
12.5000 mg | ORAL_CAPSULE | Freq: Every day | ORAL | Status: DC
  Administered 2013-09-29 – 2013-09-30 (×2): 12.5 mg via ORAL
  Filled 2013-09-29 (×2): qty 1

## 2013-09-29 MED ORDER — CEFAZOLIN SODIUM-DEXTROSE 2-3 GM-% IV SOLR
INTRAVENOUS | Status: AC
  Filled 2013-09-29: qty 50

## 2013-09-29 MED ORDER — LOSARTAN POTASSIUM 50 MG PO TABS
100.0000 mg | ORAL_TABLET | Freq: Every day | ORAL | Status: DC
  Administered 2013-09-29 – 2013-09-30 (×2): 100 mg via ORAL
  Filled 2013-09-29 (×2): qty 2

## 2013-09-29 MED ORDER — SODIUM CHLORIDE 0.9 % IV SOLN
INTRAVENOUS | Status: DC
  Administered 2013-09-29: 16:00:00 via INTRAVENOUS

## 2013-09-29 MED ORDER — ACETAMINOPHEN 500 MG PO TABS: 500.0000 mg | ORAL_TABLET | Freq: Four times a day (QID) | ORAL | Status: DC | PRN

## 2013-09-29 MED ORDER — LOSARTAN POTASSIUM-HCTZ 100-12.5 MG PO TABS: 1.0000 | ORAL_TABLET | Freq: Every morning | ORAL | Status: DC

## 2013-09-29 MED ORDER — MIDAZOLAM HCL 5 MG/5ML IJ SOLN
INTRAMUSCULAR | Status: AC
  Filled 2013-09-29: qty 5

## 2013-09-29 NOTE — Progress Notes (Signed)
Orthopedic Tech Progress Note Patient Details:  [name] [name] 1932/03/13 [id number]  Ortho Devices Type of Ortho Device: Arm sling Ortho Device/Splint Location: lue Ortho Device/Splint Interventions: Application   Hildred Priest 09/29/2013, 6:47 PM

## 2013-09-29 NOTE — Discharge Summary (Signed)
ELECTROPHYSIOLOGY DISCHARGE SUMMARY   Patient ID: [name] [name],  MRN: [id number], DOB/AGE: May 10, 1931 80 y.o.  Admit date: 09/28/2013 Discharge date: 09/29/2013  Primary Care Physician: Sinda Du, MD Primary EP: [name], MD  Primary Discharge Diagnosis:  RV lead malfunction   Secondary Discharge Diagnoses:   Recent ICD generator change  Prior VT/VF arrest s/p ICD implant  Complete heart block  CAD s/p CABG Paroxysmal atrial fibrillation  Procedures This Admission:  None   History and Hospital Course:  [name] [name] is a 80 year old woman with prior VF arrest s/p ICD upgrade, complete heart block, CAD s/p CABG and paroxsymal AFib who presented to [facility] on 09/28/2013 with complaint of her device beeping. She underwent ICD generator change this past Monday after her battery was found at [facility]. There were no complications.  Procedure note indidcates RV impedance 464. RV threshold 1.0 V @ 0.5 ms. Uneventful recovery at home. No chest pain or SOB. No syncope. No ICD shocks. Device interrogated revealed the alarm was for increased impedance on RV lead to 1080. Threshold also increased to 2.75. She was admitted for further evaluation of RV lead malfunction. She was seen by Dr. Lovena Le who recommended RV lead revision. Her ICD was reprogrammed with higher RV output. She remained hemodynamically stable and afebrile. She had no arrhythmias on telemetry. Due to scheduling issues, her procedure could not be done this week. Therefore she was discharged to return next Friday (10/06/2013) for RV lead revision. She has been seen, examined and deemed stable for discharge home today by Dr. [name]. She was instructed to return to [facility] ED if her device began beeping again.  Discharge Vitals: Blood pressure 123/63, pulse 60, temperature 98.3 F (36.8 C), temperature source Oral, resp. rate 20, height 5\' 1"  (1.549 m), weight 165 lb 12.6 oz (75.2 kg), SpO2 95.00%.   Labs: Lab Results    Component Value Date   WBC 8.9 09/28/2013   HGB 11.3* 09/28/2013   HCT 34.2* 09/28/2013   MCV 91.9 09/28/2013   PLT 162 09/28/2013    Recent Labs Lab 09/28/13 0435  NA 142  K 4.2  CL 102  CO2 24  BUN 31*  CREATININE 1.16*  CALCIUM 9.1  GLUCOSE 97    Recent Labs  09/28/13 0435  INR 1.26    Disposition:  The patient is being discharged in stable condition.  Follow-up: Follow-up Information   Follow up with [facility] On 10/06/2013. (Arrive to [facility] admitting / short stay at 9:30 AM for RV lead revision with Dr. [name])    Contact information:   [place] [id number] [phone]      Follow up with [facility] On 10/02/2013. (At 2:50 PM for Coumadin follow-up)    Specialty:  Cardiology   Contact information:   [place] [address] [phone]     Discharge Medications:  Medication Sig Dispense Refill  acetaminophen (TYLENOL) 500 MG tablet Take 500 mg by mouth every 6 (six) hours as needed for pain.      amlodipine 5 MG Take one tablet by mouth daily.    aspirin EC 81 MG tablet Take 81 mg by mouth every morning.       atorvastatin 20 MG Take one tablet by mouth daily.    Calcium Carbonate-Vitamin D (CALCIUM 600 + D PO) Take 1 tablet by mouth 2 (two) times daily.  Cholecalciferol (VITAMIN D) 2000 UNITS CAPS Take 1 capsule by mouth at bedtime.       folic acid (FOLVITE) 1 MG tablet Take 1 mg by mouth 2 (two) times daily.       furosemide (LASIX) 20 MG tablet Take 1 tablet (20 mg total) by mouth 2 (two) times daily.  60 tablet  12  losartan-hydrochlorothiazide (HYZAAR) 100-12.5 MG per tablet Take 1 tablet by mouth every morning.       metoprolol (TOPROL-XL) 200 MG 24 hr tablet Take 200 mg by mouth every morning.       Polyethyl Glycol-Propyl Glycol (SYSTANE OP) Place 1 drop into both eyes daily as needed (Dry eyes).      potassium chloride SA (K-DUR,KLOR-CON) 20 MEQ tablet Take 1  tablet (20 mEq total) by mouth 3 (three) times daily.  30 tablet  11  warfarin 2.5 MG Take 2.5-5 mg by mouth See admin instructions. She takes 5mg  daily except 2.5mg  on Monday, Wednesday and Friday     Duration of Discharge Encounter: Greater than 30 minutes including physician time.  Signed, Ileene Hutchinson, PA-C 09/29/2013, 2:11 AM

## 2013-09-29 NOTE — CV Procedure (Signed)
CEILI BOSHERS [id number]  [id number]  Preop Dx: ventricular lead failure Postop Dx same--pacing pin inadequately seeded; heme discoloration of the RV rate sense lead, Lead failure of the abandoned RV pacing lead  Procedure: reseeding of the RV r/s lead, repair of  HV R/S lead  Cx: None    [id number]  Virl Axe, MD 09/29/2013 2:33 PM

## 2013-09-29 NOTE — Progress Notes (Signed)
ANTICOAGULATION CONSULT NOTE - Follow Up Consult  Pharmacy Consult for coumadin Indication: atrial fibrillation  Allergies  Allergen Reactions  . Codeine Nausea And Vomiting    Patient Measurements: Weight: 163 lb 14.4 oz (74.345 kg)   Vital Signs: Temp: 96.9 F (36.1 C) (06/12 1510) Temp src: Oral (06/12 1510) BP: 121/63 mmHg (06/12 1510) Pulse Rate: 63 (06/12 1510)  Labs:  Recent Labs  09/28/13 0435 09/29/13 0745  HGB 11.3* 12.2  HCT 34.2* 36.4  PLT 162 161  LABPROT 15.5* 16.4*  INR 1.26 1.36  CREATININE 1.16* 1.02    The CrCl is unknown because both a height and weight (above a minimum accepted value) are required for this calculation.   Medications:  Scheduled:  . amLODipine  5 mg Oral QHS  . aspirin EC  81 mg Oral Daily  . atorvastatin  20 mg Oral QHS  . folic acid  1 mg Oral BID  . furosemide  20 mg Oral BID  . losartan  100 mg Oral Daily   And  . hydrochlorothiazide  12.5 mg Oral Daily  . metoprolol  200 mg Oral q morning - 10a  . potassium chloride SA  20 mEq Oral TID  . Warfarin - Pharmacist Dosing Inpatient   Does not apply q1800    Assessment: 80 yo female s/p lead revision and on coumadin PTA for afib. Pharmacy has been consulted to resume coumadin (no heparin bridge per Dr. Caryl Comes). INR today= 1.02  Home coumadin dose: 5mg  daily except 2.5mg  on MWF  Goal of Therapy:  INR 2-3 Monitor platelets by anticoagulation protocol: Yes   Plan:   -Coumadin 5mg  po today -Daily PT/INR  Hildred Laser, Pharm D 09/29/2013 7:02 PM

## 2013-09-29 NOTE — Progress Notes (Signed)
[facility] for Warfarin  Indication: atrial fibrillation  Allergies  Allergen Reactions  . Codeine Nausea And Vomiting    Patient Measurements:    Vital Signs: Temp: 97.1 F (36.2 C) (06/11 2302) Temp src: Oral (06/11 2302) BP: 144/61 mmHg (06/12 0430) Pulse Rate: 61 (06/12 0430)  Labs:  Recent Labs  09/28/13 0435  HGB 11.3*  HCT 34.2*  PLT 162  LABPROT 15.5*  INR 1.26  CREATININE 1.16*   The CrCl is unknown because both a height and weight (above a minimum accepted value) are required for this calculation.  Medical History: Past Medical History  Diagnosis Date  . Osteoarthritis   . Mixed hyperlipidemia   . Coronary atherosclerosis of native coronary artery     Multivessel status post CABG, DES PLA March 2006  . Essential hypertension, benign   . Heart block   . Syncope   . Ventricular tachycardia     Status post ICD  . Ischemic cardiomyopathy     LVEF 55% as of March 2014  . Atrial fibrillation    Medications:  Warfarin PTA dose from outpatient anti-coag visit: 2.5mg  MWF, 5mg  all other days  Assessment: 80 y/o female with h/o Afib for Coumadin  Goal of Therapy:  INR 2-3 Monitor platelets by anticoagulation protocol: Yes   Plan:  F/U plan for lead revision and redose Coumadin as needed Daily INR  Sherrise Liberto, Bronson Curb 09/29/2013,5:35 AM

## 2013-09-29 NOTE — ED Provider Notes (Signed)
CSN: [id number]     Arrival date & time 09/28/13  2252 History   First MD Initiated Contact with Patient 09/29/13 0405     Chief Complaint  Patient presents with  . Pacemaker Problem    The patient was seen here yesterday due to her pacemaker making a loud noise.  It did not shock her but it made a loud noise after it was replaced on Monday.     (Consider location/radiation/quality/duration/timing/severity/associated sxs/prior Treatment) The history is provided by the patient.   80 year old female with a pacemaker defibrillator which was replaced 3 days ago. She had an alarm sound come out of it and then she was admitted to the hospital for evaluation and similar settings changed and discharged. After going home, the alarm has gone off 3 more times. She denies being shocked by the defibrillator as she denies chest pain or palpitations.  Past Medical History  Diagnosis Date  . Osteoarthritis   . Mixed hyperlipidemia   . Coronary atherosclerosis of native coronary artery     Multivessel status post CABG, DES PLA March 2006  . Essential hypertension, benign   . Heart block   . Syncope   . Ventricular tachycardia     Status post ICD  . Ischemic cardiomyopathy     LVEF 55% as of March 2014  . Atrial fibrillation    Past Surgical History  Procedure Laterality Date  . Coronary artery bypass graft      LIMA to LAD, SVG to diagonal, SVG to ramus and OM  . Cardiac defibrillator placement      Guidant device  . Yag laser application Left 05/26/7122    Procedure: YAG LASER APPLICATION;  Surgeon: Elta Guadeloupe T. Gershon Crane, MD;  Location: [facility];  Service: Ophthalmology;  Laterality: Left;  . Yag laser application Right 5/80/9983    Procedure: YAG LASER APPLICATION;  Surgeon: Elta Guadeloupe T. Gershon Crane, MD;  Location: [facility];  Service: Ophthalmology;  Laterality: Right;   Family History  Problem Relation Age of Onset  . Heart attack Father   . Heart attack Brother    History  Substance Use Topics  .  Smoking status: Never Smoker   . Smokeless tobacco: Never Used  . Alcohol Use: No   OB History   Grav Para Term Preterm Abortions TAB SAB Ect Mult Living                 Review of Systems  All other systems reviewed and are negative.     Allergies  Codeine  Home Medications   Prior to Admission medications   Medication Sig Start Date End Date Taking? Authorizing Provider  acetaminophen (TYLENOL) 500 MG tablet Take 500 mg by mouth every 6 (six) hours as needed for pain.   Yes Historical Provider, MD  amLODipine (NORVASC) 5 MG tablet Take 5 mg by mouth at bedtime. 09/21/13  Yes Historical Provider, MD  aspirin EC 81 MG tablet Take 81 mg by mouth every morning.    Yes Historical Provider, MD  atorvastatin (LIPITOR) 20 MG tablet Take 20 mg by mouth at bedtime. 09/21/13  Yes Historical Provider, MD  Calcium Carbonate-Vitamin D (CALCIUM 600 + D PO) Take 1 tablet by mouth 2 (two) times daily.   Yes Historical Provider, MD  Cholecalciferol (VITAMIN D) 2000 UNITS CAPS Take 1 capsule by mouth at bedtime.    Yes Historical Provider, MD  folic acid (FOLVITE) 1 MG tablet Take 1 mg by mouth 2 (two) times daily.  Yes Historical Provider, MD  furosemide (LASIX) 20 MG tablet Take 1 tablet (20 mg total) by mouth 2 (two) times daily. 04/13/13  Yes Alonza Bogus, MD  losartan-hydrochlorothiazide (HYZAAR) 100-12.5 MG per tablet Take 1 tablet by mouth every morning.  03/29/13  Yes Historical Provider, MD  metoprolol (TOPROL-XL) 200 MG 24 hr tablet Take 200 mg by mouth every morning.    Yes Historical Provider, MD  Polyethyl Glycol-Propyl Glycol (SYSTANE OP) Place 1 drop into both eyes daily as needed (Dry eyes).   Yes Historical Provider, MD  potassium chloride SA (K-DUR,KLOR-CON) 20 MEQ tablet Take 1 tablet (20 mEq total) by mouth 3 (three) times daily. 04/13/13 04/13/14 Yes Alonza Bogus, MD  warfarin (COUMADIN) 2.5 MG tablet Take 2.5-5 mg by mouth See admin instructions. She takes 5mg  daily except  2.5mg  on Monday, Wednesday and Friday   Yes Historical Provider, MD   BP 111/64  Pulse 58  Temp(Src) 97.1 F (36.2 C) (Oral)  Resp 23  SpO2 97% Physical Exam  Nursing note and vitals reviewed.  80 year old female, resting comfortably and in no acute distress. Vital signs are significant for bradycardia with heart rate of 58, and tachypnea with respiratory rate of 23. Oxygen saturation is 97%, which is normal. Head is normocephalic and atraumatic. PERRLA, EOMI. Oropharynx is clear. Neck is nontender and supple without adenopathy or JVD. Back is nontender and there is no CVA tenderness. Lungs are clear without rales, wheezes, or rhonchi. Chest is nontender. Heart has regular rate and rhythm without murmur. Abdomen is soft, flat, nontender without masses or hepatosplenomegaly and peristalsis is normoactive. Extremities have 1+ edema, full range of motion is present. Skin is warm and dry without rash. Neurologic: Mental status is normal, cranial nerves are intact, there are no motor or sensory deficits.  ED Course  Procedures (including critical care time)  Imaging Review X-ray Chest Pa And Lateral   09/28/2013   CLINICAL DATA:  Cardiac pacer.  EXAM: CHEST  2 VIEW  COMPARISON:  08/29/2013.  FINDINGS: Mediastinum and hilar structures are normal. Heart size is stable. Pulmonary vascularity normal. New cardiac pacer is noted. Pacer leads are noted in the right atrium and right ventricle and appear in stable position from prior exam. Leads appear to be intact. Prior CABG. Apical pleural thickening consistent with scarring. No pleural effusion or pneumothorax. No acute bony abnormality.  IMPRESSION: 1. Cardiac pacer with lead tips in the right atrium and right ventricle. The pacer leads appear intact and in stable position . 2. Prior CABG.   Electronically Signed   By: Marcello Moores  Register   On: 09/28/2013 08:21   MDM   Final diagnoses:  Pacemaker complications    Pacemaker malfunction the.  This appears to be an RV lead issue. Old records are reviewed and she had been admitted yesterday for the same issue. Case is discussed with Dr. Claiborne Billings who is on call for cardiology agrees to admit the patient.    Delora Fuel, MD 16/10/96 0454

## 2013-09-29 NOTE — Progress Notes (Signed)
Patient Name: [name] [name]      SUBJECTIVE:no complaints  Past Medical History  Diagnosis Date  . Osteoarthritis   . Mixed hyperlipidemia   . Coronary atherosclerosis of native coronary artery     Multivessel status post CABG, DES PLA March 2006  . Essential hypertension, benign   . Heart block   . Syncope   . Ventricular tachycardia     Status post ICD  . Ischemic cardiomyopathy     LVEF 55% as of March 2014  . Atrial fibrillation     Scheduled Meds:  Scheduled Meds: . amLODipine  5 mg Oral QHS  . aspirin EC  81 mg Oral Daily  . atorvastatin  20 mg Oral QHS  . folic acid  1 mg Oral BID  . furosemide  20 mg Oral BID  . losartan  100 mg Oral Daily   And  . hydrochlorothiazide  12.5 mg Oral Daily  . metoprolol  200 mg Oral q morning - 10a  . potassium chloride SA  20 mEq Oral TID  . Warfarin - Pharmacist Dosing Inpatient   Does not apply q1800   Continuous Infusions:   PHYSICAL EXAM Filed Vitals:   09/29/13 0345 09/29/13 0430 09/29/13 0520 09/29/13 0601  BP: 111/64 144/61 147/103   Pulse:  61 63   Temp:   98.8 F (37.1 C)   TempSrc:   Oral   Resp: 23 19 16    Weight:    163 lb 14.4 oz (74.345 kg)  SpO2: 97% 100% 95%     Well developed and nourished in no acute distress HENT normal Neck supple with JVP-flat Clear Regular rate and rhythm, no murmurs or gallops Abd-soft with active BS No Clubbing cyanosis edema Skin-warm and dry A & Oriented  Grossly normal sensory and motor function   TELEMETRY: Reviewed telemetry pt in P-synchronous/ AV  pacing :   No intake or output data in the 24 hours ending 09/29/13 1045  LABS: Basic Metabolic Panel:  Recent Labs Lab 09/28/13 0435 09/29/13 0745  NA 142 145  K 4.2 4.2  CL 102 107  CO2 24 22  GLUCOSE 97 111*  BUN 31* 36*  CREATININE 1.16* 1.02  CALCIUM 9.1 9.5   Cardiac Enzymes: No results found for this basename: CKTOTAL, CKMB, CKMBINDEX, TROPONINI,  in the last 72 hours CBC:  Recent  Labs Lab 09/28/13 0435 09/29/13 0745  WBC 8.9 7.8  HGB 11.3* 12.2  HCT 34.2* 36.4  MCV 91.9 92.9  PLT 162 161   PROTIME:  Recent Labs  09/28/13 0435 09/29/13 0745  LABPROT 15.5* 16.4*  INR 1.26 1.36   Liver Function Tests: No results found for this basename: AST, ALT, ALKPHOS, BILITOT, PROT, ALBUMIN,  in the last 72 hours No results found for this basename: LIPASE, AMYLASE,  in the last 72 hours BNP: BNP (last 3 results)  Recent Labs  04/14/13 1046 07/19/13 0724 07/22/13 0505  PROBNP 780.0* 2488.0* 1118.0*   *   ASSESSMENT AND PLAN:  Principal Problem:   Pacemaker complications Active Problems:   Essential hypertension, benign   Coronary atherosclerosis of native coronary artery   Atrial fibrillation   Encounter for long-term (current) use of anticoagulants   Chronic diastolic heart failure Pacing issues from the HV lead Device dependent so willneed revision   Will plan to pirate the abandoned RV pacing lead   We have reviewed the benefits and risks of lead revision These include but  are not limited to lead fracture and infection.  The patient understands, agrees and is willing to proceed.       Signed, Virl Axe MD  09/29/2013

## 2013-09-29 NOTE — H&P (Signed)
[name] [name] is an 80 y.o. female.     Chief Complaint: Pacemaker beeping HPI: Ms. [name] [name] is a 80 yo woman with  prior VF arrest s/p ICD upgrade, complete heart block, CAD s/p CABG and paroxsymal atrial fibrillation who presented to [facility] on 09/28/2013 with complaint of her device beeping. She underwent ICD generator change this past Monday, 6/8,  after her battery was found at [employer]. She had good impedance, RV threshold and wet home; however, she had device alarms that revealed impedance to 1080 on RV lead leading to admission and plan for RV lead revision 6/19. She was told to return if she had more beeping and this evening she did, in fact, have more beeping leading to calling in, speaking to me, and then coming back to the hospital. She feels ok but anxious in the ER and we will discuss further with EP in the AM.      Past Medical History  Diagnosis Date  . Osteoarthritis   . Mixed hyperlipidemia   . Coronary atherosclerosis of native coronary artery     Multivessel status post CABG, DES PLA March 2006  . Essential hypertension, benign   . Heart block   . Syncope   . Ventricular tachycardia     Status post ICD  . Ischemic cardiomyopathy     LVEF 55% as of March 2014  . Atrial fibrillation     Past Surgical History  Procedure Laterality Date  . Coronary artery bypass graft      LIMA to LAD, SVG to diagonal, SVG to ramus and OM  . Cardiac defibrillator placement      Guidant device  . Yag laser application Left 10/19/6201    Procedure: YAG LASER APPLICATION;  Surgeon: Elta Guadeloupe T. Gershon Crane, MD;  Location: [facility];  Service: Ophthalmology;  Laterality: Left;  . Yag laser application Right 5/59/7416    Procedure: YAG LASER APPLICATION;  Surgeon: Elta Guadeloupe T. Gershon Crane, MD;  Location: [facility];  Service: Ophthalmology;  Laterality: Right;    Family History  Problem Relation Age of Onset  . Heart attack Father   . Heart attack Brother    Social History:  reports that she has never  smoked. She has never used smokeless tobacco. She reports that she does not drink alcohol or use illicit drugs.  Allergies:  Allergies  Allergen Reactions  . Codeine Nausea And Vomiting     (Not in a hospital admission)  Results for orders placed during the hospital encounter of 09/28/13 (from the past 48 hour(s))  CBC     Status: Abnormal   Collection Time    09/28/13  4:35 AM      Result Value Ref Range   WBC 8.9  4.0 - 10.5 K/uL   RBC 3.72 (*) 3.87 - 5.11 MIL/uL   Hemoglobin 11.3 (*) 12.0 - 15.0 g/dL   HCT 34.2 (*) 36.0 - 46.0 %   MCV 91.9  78.0 - 100.0 fL   MCH 30.4  26.0 - 34.0 pg   MCHC 33.0  30.0 - 36.0 g/dL   RDW 13.8  11.5 - 15.5 %   Platelets 162  150 - 400 K/uL  BASIC METABOLIC PANEL     Status: Abnormal   Collection Time    09/28/13  4:35 AM      Result Value Ref Range   Sodium 142  137 - 147 mEq/L   Potassium 4.2  3.7 - 5.3 mEq/L   Chloride 102  96 -  112 mEq/L   CO2 24  19 - 32 mEq/L   Glucose, Bld 97  70 - 99 mg/dL   BUN 31 (*) 6 - 23 mg/dL   Creatinine, Ser 1.16 (*) 0.50 - 1.10 mg/dL   Calcium 9.1  8.4 - 10.5 mg/dL   GFR calc non Af Amer 44 (*) >90 mL/min   GFR calc Af Amer 51 (*) >90 mL/min   Comment: (NOTE)     The eGFR has been calculated using the CKD EPI equation.     This calculation has not been validated in all clinical situations.     eGFR's persistently <90 mL/min signify possible Chronic Kidney     Disease.  PROTIME-INR     Status: Abnormal   Collection Time    09/28/13  4:35 AM      Result Value Ref Range   Prothrombin Time 15.5 (*) 11.6 - 15.2 seconds   INR 1.26  0.00 - 1.49   X-ray Chest Pa And Lateral   09/28/2013   CLINICAL DATA:  Cardiac pacer.  EXAM: CHEST  2 VIEW  COMPARISON:  08/29/2013.  FINDINGS: Mediastinum and hilar structures are normal. Heart size is stable. Pulmonary vascularity normal. New cardiac pacer is noted. Pacer leads are noted in the right atrium and right ventricle and appear in stable position from prior exam.  Leads appear to be intact. Prior CABG. Apical pleural thickening consistent with scarring. No pleural effusion or pneumothorax. No acute bony abnormality.  IMPRESSION: 1. Cardiac pacer with lead tips in the right atrium and right ventricle. The pacer leads appear intact and in stable position . 2. Prior CABG.   Electronically Signed   By: Marcello Moores  Register   On: 09/28/2013 08:21    Review of Systems  Constitutional: Negative for fever and chills.  HENT: Negative for ear discharge.   Eyes: Negative for blurred vision and double vision.  Respiratory: Negative for cough and hemoptysis.   Cardiovascular: Negative for chest pain and palpitations.  Gastrointestinal: Negative for heartburn and abdominal pain.  Genitourinary: Negative for dysuria and urgency.  Musculoskeletal: Negative for myalgias and neck pain.  Skin: Negative for rash.  Neurological: Negative for dizziness, tingling and headaches.  Endo/Heme/Allergies: Negative for polydipsia. Bruises/bleeds easily.  Psychiatric/Behavioral: Negative for depression and suicidal ideas. The patient is nervous/anxious.     Blood pressure 111/64, pulse 58, temperature 97.1 F (36.2 C), temperature source Oral, resp. rate 23, SpO2 97.00%. Physical Exam  Nursing note and vitals reviewed. Constitutional: She is oriented to person, place, and time. She appears well-developed and well-nourished. No distress.  HENT:  Head: Normocephalic and atraumatic.  Nose: Nose normal.  Mouth/Throat: Oropharynx is clear and moist. No oropharyngeal exudate.  Eyes: Conjunctivae and EOM are normal. Pupils are equal, round, and reactive to light. No scleral icterus.  Neck: Normal range of motion. Neck supple. No JVD present. No tracheal deviation present. No thyromegaly present.  Cardiovascular: Normal rate, regular rhythm, normal heart sounds and intact distal pulses.  Exam reveals no gallop.   No murmur heard. Respiratory: Effort normal and breath sounds normal. No  respiratory distress. She has no wheezes. She has no rales.  GI: Soft. Bowel sounds are normal. She exhibits no distension. There is no tenderness. There is no rebound.  Musculoskeletal: Normal range of motion. She exhibits no edema and no tenderness.  Neurological: She is alert and oriented to person, place, and time. She has normal reflexes. No cranial nerve deficit. Coordination normal.  Skin: Skin is  warm and dry. No rash noted. No erythema.  Psychiatric: She has a normal mood and affect. Her behavior is normal. Thought content normal.    Labs reviewed Above  Problem List Pacemaker malfunctioning/Elevated Impedance Atrial fibrillation  Hypertension Chronic diastolic HF  Assessment/Plan Ms. Rehfeldt is a 80 yo woman with above problems who re-presents with continued device beeping. We will have EP see her this AM and hopefully have RV lead revision performed sooner than next 6/19. She is currently NPO for possible but unlikely chance she can have further procedures today, Friday. Otherwise, continue home medications.  - Review interrogation in full with Rep/EP - continue home medications - NPO for potential procedure, then feed Ms. Lien if not anticipated   Roy Tokarz 09/29/2013, 4:31 AM

## 2013-09-29 NOTE — Progress Notes (Signed)
UR completed 

## 2013-09-30 LAB — PROTIME-INR
INR: 1.3 (ref 0.00–1.49)
Prothrombin Time: 15.9 s — ABNORMAL HIGH (ref 11.6–15.2)

## 2013-09-30 NOTE — Discharge Summary (Signed)
Physician Discharge Summary  Patient ID: [name] [name] MRN: [id number] DOB/AGE: 78-09-1931 78 y.o.  Admit date: 09/29/2013 Discharge date: 09/30/2013  Admission Diagnoses: Pacemaker complications  Discharge Diagnoses:  Principal Problem:   Pacemaker complications Active Problems:   Essential hypertension, benign   Coronary atherosclerosis of native coronary artery   Atrial fibrillation   Encounter for long-term (current) use of anticoagulants   Chronic diastolic heart failure   Discharged Condition: stable  Hospital Course: [name] [name] is a 80 yo female with prior VF arrest, s/p ICD upgrade, complete heart block, CAD s/p CABG and paroxsymal atrial fibrillation who presented to [facility] on 09/28/2013 with complaint of her device beeping. She underwent ICD generator change on 09/25/13, after her battery was found at [name]. She had good impedance, RV threshold was discharged home; however, she had device alarms that revealed impedance to 1080 on RV lead leading to admission and plan for RV lead revision 6/19. She was told to return sooner if she had more beeping.   On arrival to the ER, she was evaluated by EP and her device was interrogated. Due to pacing issues from the HV lead and the fact that she was pace dependent, it was decided that she would need to undergo lead revision.  The procedure was performed by Dr. Caryl Comes. The procedure involved redeployment of the RV lead and repair of the high-voltage rate sense lead.  She tolerated to procedure well. There were no post operative complications. Post-operative CXR demonstrated proper lead placement w/o evidence of pneumothorax. Device interrogation revealed normal functioning. Her wound site was stable. She was last seen and examined by Dr. Caryl Comes, who determined she was stable for discharge home. She was provided detailed instructions on proper wound care and activity restrictions. Wound check follow-up was arrange with EP service.    Consults: None  Significant Diagnostic Studies:    Treatments: See Hospital Course  Discharge Exam: Blood pressure 136/44, pulse 62, temperature 98.1 F (36.7 C), temperature source Oral, resp. rate 20, weight 163 lb 6.4 oz (74.118 kg), SpO2 96.00%.   Disposition: 01-Home or Self Care     Medication List         acetaminophen 500 MG tablet  Commonly known as:  TYLENOL  Take 500 mg by mouth every 6 (six) hours as needed for pain.     amLODipine 5 MG tablet  Commonly known as:  NORVASC  Take 5 mg by mouth at bedtime.     aspirin EC 81 MG tablet  Take 81 mg by mouth every morning.     atorvastatin 20 MG tablet  Commonly known as:  LIPITOR  Take 20 mg by mouth at bedtime.     CALCIUM 600 + D PO  Take 1 tablet by mouth 2 (two) times daily.     folic acid 1 MG tablet  Commonly known as:  FOLVITE  Take 1 mg by mouth 2 (two) times daily.     furosemide 20 MG tablet  Commonly known as:  LASIX  Take 1 tablet (20 mg total) by mouth 2 (two) times daily.     losartan-hydrochlorothiazide 100-12.5 MG per tablet  Commonly known as:  HYZAAR  Take 1 tablet by mouth every morning.     metoprolol 200 MG 24 hr tablet  Commonly known as:  TOPROL-XL  Take 200 mg by mouth every morning.     potassium chloride SA 20 MEQ tablet  Commonly known as:  K-DUR,KLOR-CON  Take 1 tablet (20  mEq total) by mouth 3 (three) times daily.     SYSTANE OP  Place 1 drop into both eyes daily as needed (Dry eyes).     Vitamin D 2000 UNITS Caps  Take 1 capsule by mouth at bedtime.     warfarin 2.5 MG tablet  Commonly known as:  COUMADIN  Take 2.5-5 mg by mouth See admin instructions. She takes 5mg  daily except 2.5mg  on Monday, Wednesday and Friday           Follow-up Information   Follow up with [facility] On 10/02/2013. (INR check 2:50 pm)    Specialty:  Cardiology   Contact information:   [place] [address] [phone]     TIME SPENT ON DISCHARGE,  INCLUDING PHYSICIAN TIME: >30 MINUTES  Signed: Lyda Jester 09/30/2013, 10:05 AM

## 2013-09-30 NOTE — Progress Notes (Signed)
Patient Care Team: Alonza Bogus, MD as PCP - General (Internal Medicine)   HPI  [name] [name] is a 80 y.o. female without complaint this am   Past Medical History  Diagnosis Date  . Osteoarthritis   . Mixed hyperlipidemia   . Coronary atherosclerosis of native coronary artery     Multivessel status post CABG, DES PLA March 2006  . Essential hypertension, benign   . Heart block   . Syncope   . Ventricular tachycardia     Status post ICD  . Ischemic cardiomyopathy     LVEF 55% as of March 2014  . Atrial fibrillation     Past Surgical History  Procedure Laterality Date  . Coronary artery bypass graft      LIMA to LAD, SVG to diagonal, SVG to ramus and OM  . Cardiac defibrillator placement      Guidant device  . Yag laser application Left 06/25/1694    Procedure: YAG LASER APPLICATION;  Surgeon: Elta Guadeloupe T. Gershon Crane, MD;  Location: [facility];  Service: Ophthalmology;  Laterality: Left;  . Yag laser application Right 7/89/3810    Procedure: YAG LASER APPLICATION;  Surgeon: Elta Guadeloupe T. Gershon Crane, MD;  Location: [facility];  Service: Ophthalmology;  Laterality: Right;    Current Facility-Administered Medications  Medication Dose Route Frequency Provider Last Rate Last Dose  . acetaminophen (TYLENOL) tablet 500 mg  500 mg Oral Q6H PRN Jules Husbands, MD      . amLODipine (NORVASC) tablet 5 mg  5 mg Oral QHS Jules Husbands, MD      . aspirin EC tablet 81 mg  81 mg Oral Daily Jules Husbands, MD      . atorvastatin (LIPITOR) tablet 20 mg  20 mg Oral QHS Jules Husbands, MD   20 mg at 09/29/13 2159  . folic acid (FOLVITE) tablet 1 mg  1 mg Oral BID Jules Husbands, MD   1 mg at 09/29/13 2159  . furosemide (LASIX) tablet 20 mg  20 mg Oral BID Jules Husbands, MD   20 mg at 09/29/13 1828  . losartan (COZAAR) tablet 100 mg  100 mg Oral Daily Jules Husbands, MD   100 mg at 09/29/13 1110   And  . hydrochlorothiazide (MICROZIDE) capsule 12.5 mg  12.5 mg Oral Daily Jules Husbands, MD   12.5 mg at 09/29/13 1109  .  metoprolol succinate (TOPROL-XL) 24 hr tablet 200 mg  200 mg Oral q morning - 10a Jules Husbands, MD   200 mg at 09/29/13 1110  . ondansetron (ZOFRAN) injection 4 mg  4 mg Intravenous Q6H PRN Deboraha Sprang, MD      . potassium chloride SA (K-DUR,KLOR-CON) CR tablet 20 mEq  20 mEq Oral TID Jules Husbands, MD   20 mEq at 09/29/13 2158  . Warfarin - Pharmacist Dosing Inpatient   Does not apply [id number] Jules Husbands, MD        Allergies  Allergen Reactions  . Codeine Nausea And Vomiting    Review of Systems negative except from HPI and PMH  Physical Exam BP 136/44  Pulse 62  Temp(Src) 98.1 F (36.7 C) (Oral)  Resp 20  Wt 163 lb 6.4 oz (74.118 kg)  SpO2 96% Well developed and well nourished in no acute distress HENT normal E scleral and icterus clear Neck Supple JVP flat; carotids brisk and full Clear to ausculation Device pocket well healed; without hematoma or erythema.  There is no tethering  Regular rate and  rhythm, no murmurs gallops or rub Soft with active bowel sounds No clubbing cyanosis Edema Alert and oriented, grossly normal motor and sensory function Skin Warm and Dry  Device interrgoation  Normal function  Assessment and  Plan  Compete heart block  Revision 2/2 inadequate positioning of R/S lead     instuctions given Wound check appt MADE No med changes

## 2013-09-30 NOTE — Progress Notes (Signed)
Patient Blood pressure 86/48. Heart rate 60's. Patient asymptomatic. MD notified. Order placed for 100 mL bolus 0.45% NS. See MAR. Will continue to monitor. Call bell within reach.   Domingo Dimes RN

## 2013-09-30 NOTE — Op Note (Signed)
NAME:  [name] [name], [name] [name]                  ACCOUNT NO.:  [id number]  MEDICAL RECORD NO.:  [id number]  LOCATION:  2W18C                        FACILITY:  [facility]  PHYSICIAN:  Deboraha Sprang, MD, FACCDATE OF BIRTH:  May 15, 1931  DATE OF PROCEDURE:  09/29/2013 DATE OF DISCHARGE:                              OPERATIVE REPORT   PREOPERATIVE DIAGNOSIS:  Ventricular lead failure in a pacemaker dependent patient following device revision earlier this week.  POSTOPERATIVE DIAGNOSIS:  Inadequacy of the rate sense pin; heme discoloration of the rate sense leads; failure of the previously abandoned RV pacing lead.  PROCEDURE:  Redeployment of the RV lead, repair of the high-voltage rate sense lead.  DESCRIPTION OF PROCEDURE:  Following obtaining informed consent, the patient was brought to the electrophysiology laboratory and placed on the fluoroscopic table in supine position.  After routine prep and drape, a fluorogram demonstrated that the pin of the rate sense portion of the lead was not visible pass the anchoring block.  It was surmised that inadequate seating of the lead was in fact a problem.  We removed the device from the pocket.  Attempts to further insert the lead failed.  As we undertook efforts to deeply see the rate sense portion, we lost our ability to capture reliably and the previously abandoned rate sense lead was uncapped; however, it turned out that there was very poor capture and unreliable capture even at 5 V.  A 10 V at 1.5 milliseconds we could capture the RV reproducibly.  We did need transient transcutaneous pacing.  With reliable back up from the abandoned lead, we then added silicone oil to the rate sense lead and deployed it and secured it.  Both visually and fluoroscopically, we could confirm that the pin was now passed at the end of the pacing block.  At this point, interrogation of the lead demonstrated repeated normal impedance measurements in the 430 range having  been 3000 prior to further manipulation.  Because of the heme discoloration, there was a medical adhesive deployed along the rate sense portion of the high-voltage lead.  The pocket was expanded just a little bit to allow for easier redeployment of the device and the pocket was closed in 3 layers in normal fashion.  The wound was washed, dried, and a Dermabond dressing was applied.  Needle counts, sponge counts, and instrument counts were correct at the end of the procedure according to staff.  The patient tolerated the procedure without apparent complication.     Deboraha Sprang, MD, [facility]     SCK/MEDQ  D:  09/29/2013  T:  09/30/2013  Job:  [id number]

## 2013-10-02 ENCOUNTER — Telehealth: Payer: Self-pay | Admitting: *Deleted

## 2013-10-02 NOTE — Telephone Encounter (Signed)
PATIENT STATES THAT SHE WOULD LIKE FOR YOU TO CALL HER.  STATES THAT SHE JUST GOT OUT OF HOSPITAL AND WOULD LIKE TO SPEAK WITH YOU. / TGS

## 2013-10-02 NOTE — Telephone Encounter (Signed)
Pt scheduled for INR appt today.  She was just D/C from [facility] Saturday after defib lead revision.  D/C INR was 1.3.  Pt is back on her regular dose of coumadin.  INR appt canceled for today and moved to 10/09/13.

## 2013-10-03 ENCOUNTER — Encounter: Payer: Self-pay | Admitting: Internal Medicine

## 2013-10-06 ENCOUNTER — Ambulatory Visit (INDEPENDENT_AMBULATORY_CARE_PROVIDER_SITE_OTHER): Payer: Medicare Other | Admitting: *Deleted

## 2013-10-06 DIAGNOSIS — I472 Ventricular tachycardia: Secondary | ICD-10-CM

## 2013-10-06 DIAGNOSIS — I4729 Other ventricular tachycardia: Secondary | ICD-10-CM

## 2013-10-06 DIAGNOSIS — I5032 Chronic diastolic (congestive) heart failure: Secondary | ICD-10-CM

## 2013-10-06 LAB — MDC_IDC_ENUM_SESS_TYPE_INCLINIC
Battery Remaining Longevity: 99 mo
Brady Statistic AP VP Percent: 96.24 %
Brady Statistic AS VP Percent: 3.74 %
Brady Statistic AS VS Percent: 0 %
HighPow Impedance: 19 Ohm
HighPow Impedance: 49 Ohm
HighPow Impedance: 71 Ohm
Lead Channel Impedance Value: 399 Ohm
Lead Channel Impedance Value: 475 Ohm
Lead Channel Pacing Threshold Amplitude: 0.75 V
Lead Channel Sensing Intrinsic Amplitude: 2.75 mV
Lead Channel Sensing Intrinsic Amplitude: 2.75 mV
Lead Channel Setting Pacing Amplitude: 2 V
Lead Channel Setting Pacing Amplitude: 2.5 V
Lead Channel Setting Pacing Pulse Width: 0.4 ms
MDC IDC MSMT BATTERY VOLTAGE: 3.06 V
MDC IDC MSMT LEADCHNL RA PACING THRESHOLD AMPLITUDE: 0.75 V
MDC IDC MSMT LEADCHNL RA PACING THRESHOLD PULSEWIDTH: 0.4 ms
MDC IDC MSMT LEADCHNL RV PACING THRESHOLD PULSEWIDTH: 0.4 ms
MDC IDC SESS DTM: 20150619142514
MDC IDC SET LEADCHNL RV SENSING SENSITIVITY: 0.3 mV
MDC IDC STAT BRADY AP VS PERCENT: 0.02 %
MDC IDC STAT BRADY RA PERCENT PACED: 96.26 %
MDC IDC STAT BRADY RV PERCENT PACED: 99.98 %
Zone Setting Detection Interval: 300 ms
Zone Setting Detection Interval: 350 ms
Zone Setting Detection Interval: 350 ms
Zone Setting Detection Interval: 450 ms

## 2013-10-06 NOTE — Progress Notes (Signed)
Wound check-ICD in office. 

## 2013-10-09 ENCOUNTER — Ambulatory Visit (INDEPENDENT_AMBULATORY_CARE_PROVIDER_SITE_OTHER): Payer: Medicare Other | Admitting: *Deleted

## 2013-10-09 ENCOUNTER — Other Ambulatory Visit: Payer: Self-pay | Admitting: Internal Medicine

## 2013-10-09 DIAGNOSIS — I482 Chronic atrial fibrillation, unspecified: Secondary | ICD-10-CM

## 2013-10-09 DIAGNOSIS — I4891 Unspecified atrial fibrillation: Secondary | ICD-10-CM

## 2013-10-09 DIAGNOSIS — Z5181 Encounter for therapeutic drug level monitoring: Secondary | ICD-10-CM

## 2013-10-09 LAB — POCT INR: INR: 2.8

## 2013-10-24 ENCOUNTER — Encounter: Payer: Self-pay | Admitting: Internal Medicine

## 2013-10-26 ENCOUNTER — Ambulatory Visit (HOSPITAL_COMMUNITY)
Admission: RE | Admit: 2013-10-26 | Discharge: 2013-10-26 | Disposition: A | Discharge status: Home / Self Care | Payer: Medicare Other | Source: Ambulatory Visit | Attending: Pulmonary Disease | Admitting: Pulmonary Disease

## 2013-10-26 ENCOUNTER — Other Ambulatory Visit (HOSPITAL_COMMUNITY): Payer: Self-pay | Admitting: Pulmonary Disease

## 2013-10-26 DIAGNOSIS — M79609 Pain in unspecified limb: Secondary | ICD-10-CM | POA: Insufficient documentation

## 2013-10-26 DIAGNOSIS — M79672 Pain in left foot: Secondary | ICD-10-CM

## 2013-10-30 ENCOUNTER — Ambulatory Visit (INDEPENDENT_AMBULATORY_CARE_PROVIDER_SITE_OTHER): Payer: Medicare Other | Admitting: *Deleted

## 2013-10-30 DIAGNOSIS — Z5181 Encounter for therapeutic drug level monitoring: Secondary | ICD-10-CM

## 2013-10-30 DIAGNOSIS — I4891 Unspecified atrial fibrillation: Secondary | ICD-10-CM

## 2013-10-30 DIAGNOSIS — I482 Chronic atrial fibrillation, unspecified: Secondary | ICD-10-CM

## 2013-10-30 LAB — POCT INR: INR: 4

## 2013-11-13 ENCOUNTER — Ambulatory Visit (INDEPENDENT_AMBULATORY_CARE_PROVIDER_SITE_OTHER): Payer: Medicare Other | Admitting: *Deleted

## 2013-11-13 DIAGNOSIS — Z5181 Encounter for therapeutic drug level monitoring: Secondary | ICD-10-CM

## 2013-11-13 DIAGNOSIS — I482 Chronic atrial fibrillation, unspecified: Secondary | ICD-10-CM

## 2013-11-13 DIAGNOSIS — I4891 Unspecified atrial fibrillation: Secondary | ICD-10-CM

## 2013-11-13 LAB — POCT INR: INR: 3.8

## 2013-11-27 ENCOUNTER — Ambulatory Visit (INDEPENDENT_AMBULATORY_CARE_PROVIDER_SITE_OTHER): Payer: Medicare Other | Admitting: *Deleted

## 2013-11-27 DIAGNOSIS — Z5181 Encounter for therapeutic drug level monitoring: Secondary | ICD-10-CM

## 2013-11-27 DIAGNOSIS — I482 Chronic atrial fibrillation, unspecified: Secondary | ICD-10-CM

## 2013-11-27 DIAGNOSIS — I4891 Unspecified atrial fibrillation: Secondary | ICD-10-CM

## 2013-11-27 LAB — POCT INR: INR: 1.9

## 2013-12-05 ENCOUNTER — Telehealth: Payer: Self-pay | Admitting: *Deleted

## 2013-12-05 NOTE — Telephone Encounter (Signed)
Pt is having LASIK by Dr Nicole Cella on 8/20.  He told her to hold her coumadin and ASA 8/18 & 8/19.  She can restart coumadin on 8/20 after procedure at her regular dose.  She has f/u INR appt on 8/27 already scheduled.

## 2013-12-05 NOTE — Telephone Encounter (Signed)
She has a question about her coumadin

## 2013-12-14 ENCOUNTER — Ambulatory Visit (INDEPENDENT_AMBULATORY_CARE_PROVIDER_SITE_OTHER): Payer: Medicare Other | Admitting: *Deleted

## 2013-12-14 DIAGNOSIS — Z5181 Encounter for therapeutic drug level monitoring: Secondary | ICD-10-CM

## 2013-12-14 DIAGNOSIS — I482 Chronic atrial fibrillation, unspecified: Secondary | ICD-10-CM

## 2013-12-14 DIAGNOSIS — I4891 Unspecified atrial fibrillation: Secondary | ICD-10-CM

## 2013-12-14 LAB — POCT INR: INR: 2

## 2014-01-02 ENCOUNTER — Encounter: Payer: Self-pay | Admitting: Cardiology

## 2014-01-02 ENCOUNTER — Ambulatory Visit (INDEPENDENT_AMBULATORY_CARE_PROVIDER_SITE_OTHER): Payer: Medicare Other | Admitting: Cardiology

## 2014-01-02 VITALS — BP 128/70 | HR 62 | Ht 61.0 in | Wt 171.0 lb

## 2014-01-02 DIAGNOSIS — I5032 Chronic diastolic (congestive) heart failure: Secondary | ICD-10-CM

## 2014-01-02 DIAGNOSIS — Z95810 Presence of automatic (implantable) cardiac defibrillator: Secondary | ICD-10-CM

## 2014-01-02 DIAGNOSIS — I10 Essential (primary) hypertension: Secondary | ICD-10-CM

## 2014-01-02 DIAGNOSIS — I482 Chronic atrial fibrillation, unspecified: Secondary | ICD-10-CM

## 2014-01-02 DIAGNOSIS — I4891 Unspecified atrial fibrillation: Secondary | ICD-10-CM

## 2014-01-02 NOTE — Progress Notes (Signed)
Clinical Summary Ms. Vernet is a 80 y.o.female last seen in May. She continues to follow with Dr. Lovena Le for device evaluation. I reviewed records regarding her device modifications in the interim. She states that she has been doing reasonably well, chronic stable shortness of breath. Her weight is up approximately 8 pounds over baseline. She admits that her diet has not been as good in the last several weeks. Doesn't seem to be experiencing any more leg edema.  Echocardiogram from May of this year showed mild LVH with LVEF 01-02%, grade 2 diastolic dysfunction with increased filling pressures, mild to moderate left atrial enlargement, mild mitral regurgitation, very mildly stenotic aortic valve, device wire in right heart, mild tricuspid regurgitation with PASP 42 mm mercury.  Lab work in June showed potassium 4.2, BUN 36, creatinine 1.0.   Allergies  Allergen Reactions  . Codeine Nausea And Vomiting    Current Outpatient Prescriptions  Medication Sig Dispense Refill  . acetaminophen (TYLENOL) 500 MG tablet Take 500 mg by mouth every 6 (six) hours as needed for pain.      [name] amLODipine (NORVASC) 5 MG tablet Take 5 mg by mouth at bedtime.      [name] aspirin EC 81 MG tablet Take 81 mg by mouth every morning.       [name] atorvastatin (LIPITOR) 20 MG tablet Take 20 mg by mouth at bedtime.      . Calcium Carbonate-Vitamin D (CALCIUM 600 + D PO) Take 1 tablet by mouth 2 (two) times daily.      . Cholecalciferol (VITAMIN D) 2000 UNITS CAPS Take 1 capsule by mouth at bedtime.       . folic acid (FOLVITE) 1 MG tablet Take 1 mg by mouth 2 (two) times daily.       . furosemide (LASIX) 20 MG tablet Take 1 tablet (20 mg total) by mouth 2 (two) times daily.  60 tablet  12  . losartan-hydrochlorothiazide (HYZAAR) 100-12.5 MG per tablet Take 1 tablet by mouth every morning.       . metoprolol (TOPROL-XL) 200 MG 24 hr tablet Take 200 mg by mouth every morning.       Vladimir Faster Glycol-Propyl Glycol (SYSTANE OP)  Place 1 drop into both eyes daily as needed (Dry eyes).      . potassium chloride SA (K-DUR,KLOR-CON) 20 MEQ tablet Take 1 tablet (20 mEq total) by mouth 3 (three) times daily.  30 tablet  11  . warfarin (COUMADIN) 2.5 MG tablet Take 2.5-5 mg by mouth See admin instructions. She takes 5mg  daily except 2.5mg  on Monday, Wednesday and Friday       No current facility-administered medications for this visit.    Past Medical History  Diagnosis Date  . Osteoarthritis   . Mixed hyperlipidemia   . Coronary atherosclerosis of native coronary artery     Multivessel status post CABG, DES PLA March 2006  . Essential hypertension, benign   . Heart block   . Syncope   . Ventricular tachycardia     Status post ICD  . Ischemic cardiomyopathy     LVEF 55% as of March 2014  . Atrial fibrillation     Past Surgical History  Procedure Laterality Date  . Coronary artery bypass graft      LIMA to LAD, SVG to diagonal, SVG to ramus and OM  . Cardiac defibrillator placement      Guidant device  . Yag laser application Left 10/19/5364    Procedure: YAG  LASER APPLICATION;  Surgeon: Elta Guadeloupe T. Gershon Crane, MD;  Location: [facility];  Service: Ophthalmology;  Laterality: Left;  . Yag laser application Right 4/58/0998    Procedure: YAG LASER APPLICATION;  Surgeon: Elta Guadeloupe T. Gershon Crane, MD;  Location: [facility];  Service: Ophthalmology;  Laterality: Right;    Social History Ms. Feijoo reports that she has never smoked. She has never used smokeless tobacco. Ms. Iglesia reports that she does not drink alcohol.  Review of Systems Other systems reviewed and negative except as outlined.  Physical Examination Filed Vitals:   01/02/14 1303  BP: 128/70  Pulse: 62   Filed Weights   01/02/14 1303  Weight: 171 lb (77.565 kg)    Comfortable at rest.  HEENT: Conjunctiva and lids normal, oropharynx clear. Resolving ecchymoses left forehead and periorbital region.  Neck: Supple, no elevated JVP or carotid bruits, no thyromegaly.    Lungs: Clear to auscultation, nonlabored breathing at rest.  Cardiac: Regular rate and rhythm, no S3, soft systolic murmur, no pericardial rub.  Thorax: Well-healed device pocket site. Abdomen: Soft, nontender, bowel sounds present.  Extremities: Mild ankle edema, distal pulses 2+.  Skin: Warm and dry.  Musculoskeletal: No kyphosis.  Neuropsychiatric: Alert and oriented x3, affect grossly appropriate.   Problem List and Plan   Chronic diastolic heart failure I have asked her to double her morning Lasix dose for the next 2-3 days to see if we can get her weight coming back down somewhat. Otherwise continue present regimen. Followup in 3 months with [facility].  IMPLANTATION OF DEFIBRILLATOR, HX OF Recent device revision, records reviewed. She has a visit with Dr. Lovena Le this week.  Essential hypertension, benign Blood pressure control is good today.  Atrial fibrillation Continues on Coumadin.    Satira Sark, M.D., F.A.C.C.

## 2014-01-02 NOTE — Assessment & Plan Note (Signed)
Recent device revision, records reviewed. She has a visit with Dr. Lovena Le this week.

## 2014-01-02 NOTE — Assessment & Plan Note (Signed)
Continues on Coumadin.

## 2014-01-02 NOTE — Assessment & Plan Note (Signed)
Blood pressure control is good today. 

## 2014-01-02 NOTE — Patient Instructions (Signed)
Your physician recommends that you schedule a follow-up appointment in: 3 months   Please double your morning lasix dose for the next three days only,then resume regular dose.We will weigh you later this week at apt with Dr.Taylor   Please have blood work done just before next visit      Thank you for choosing [facility] !

## 2014-01-02 NOTE — Assessment & Plan Note (Signed)
I have asked her to double her morning Lasix dose for the next 2-3 days to see if we can get her weight coming back down somewhat. Otherwise continue present regimen. Followup in 3 months with [facility].

## 2014-01-04 ENCOUNTER — Ambulatory Visit (INDEPENDENT_AMBULATORY_CARE_PROVIDER_SITE_OTHER): Payer: Medicare Other | Admitting: Internal Medicine

## 2014-01-04 ENCOUNTER — Encounter: Payer: Self-pay | Admitting: Internal Medicine

## 2014-01-04 ENCOUNTER — Ambulatory Visit (INDEPENDENT_AMBULATORY_CARE_PROVIDER_SITE_OTHER): Payer: Medicare Other | Admitting: *Deleted

## 2014-01-04 VITALS — BP 142/70 | HR 83 | Wt 168.0 lb

## 2014-01-04 DIAGNOSIS — I442 Atrioventricular block, complete: Secondary | ICD-10-CM

## 2014-01-04 DIAGNOSIS — Z95810 Presence of automatic (implantable) cardiac defibrillator: Secondary | ICD-10-CM

## 2014-01-04 DIAGNOSIS — Z5181 Encounter for therapeutic drug level monitoring: Secondary | ICD-10-CM

## 2014-01-04 DIAGNOSIS — I4891 Unspecified atrial fibrillation: Secondary | ICD-10-CM

## 2014-01-04 DIAGNOSIS — I482 Chronic atrial fibrillation, unspecified: Secondary | ICD-10-CM

## 2014-01-04 DIAGNOSIS — I5032 Chronic diastolic (congestive) heart failure: Secondary | ICD-10-CM

## 2014-01-04 LAB — MDC_IDC_ENUM_SESS_TYPE_REMOTE
Battery Voltage: 3.02 V
Brady Statistic AP VP Percent: 96.85 %
Brady Statistic AP VS Percent: 0.04 %
Brady Statistic AS VS Percent: 0.01 %
Brady Statistic RV Percent Paced: 99.95 %
Date Time Interrogation Session: 20150917202620
HighPow Impedance: 19 Ohm
HighPow Impedance: 57 Ohm
HighPow Impedance: 82 Ohm
Lead Channel Pacing Threshold Pulse Width: 0.4 ms
Lead Channel Sensing Intrinsic Amplitude: 2.75 mV
Lead Channel Setting Pacing Amplitude: 2 V
Lead Channel Setting Pacing Pulse Width: 0.4 ms
Lead Channel Setting Sensing Sensitivity: 0.3 mV
MDC IDC MSMT BATTERY REMAINING LONGEVITY: 95 mo
MDC IDC MSMT LEADCHNL RA IMPEDANCE VALUE: 513 Ohm
MDC IDC MSMT LEADCHNL RA SENSING INTR AMPL: 2.75 mV
MDC IDC MSMT LEADCHNL RV IMPEDANCE VALUE: 399 Ohm
MDC IDC MSMT LEADCHNL RV PACING THRESHOLD AMPLITUDE: 0.75 V
MDC IDC SET LEADCHNL RV PACING AMPLITUDE: 2.5 V
MDC IDC SET ZONE DETECTION INTERVAL: 300 ms
MDC IDC SET ZONE DETECTION INTERVAL: 350 ms
MDC IDC STAT BRADY AS VP PERCENT: 3.1 %
MDC IDC STAT BRADY RA PERCENT PACED: 96.89 %
Zone Setting Detection Interval: 350 ms
Zone Setting Detection Interval: 450 ms

## 2014-01-04 LAB — POCT INR: INR: 2.9

## 2014-01-04 NOTE — Patient Instructions (Signed)
Your physician wants you to follow-up in: 9 months with Dr. Taylor. You will receive a reminder letter in the mail two months in advance. If you don't receive a letter, please call our office to schedule the follow-up appointment.  Remote monitoring is used to monitor your Pacemaker of ICD from home. This monitoring reduces the number of office visits required to check your device to one time per year. It allows us to keep an eye on the functioning of your device to ensure it is working properly. You are scheduled for a device check from home on December 17th. You may send your transmission at any time that day. If you have a wireless device, the transmission will be sent automatically. After your physician reviews your transmission, you will receive a postcard with your next transmission date.  Your physician recommends that you continue on your current medications as directed. Please refer to the Current Medication list given to you today.  Thank you for choosing [facility]!!     

## 2014-01-05 ENCOUNTER — Other Ambulatory Visit: Payer: Self-pay | Admitting: Internal Medicine

## 2014-01-05 ENCOUNTER — Encounter: Payer: Self-pay | Admitting: Internal Medicine

## 2014-01-05 NOTE — Assessment & Plan Note (Signed)
Her medtronic ICD is working normally. Will recheck in several months.

## 2014-01-05 NOTE — Progress Notes (Signed)
HPI [name] [name] returns today for followup. She is a pleasant 80 yo woman with a h/o VF, CHB, s/p ICD implant who underwent ICD generator change several months ago. In the interim, she has done well. No chest pain or sob. No syncope. Allergies  Allergen Reactions  . Codeine Nausea And Vomiting     Current Outpatient Prescriptions  Medication Sig Dispense Refill  . acetaminophen (TYLENOL) 500 MG tablet Take 500 mg by mouth every 6 (six) hours as needed for pain.      [name] amLODipine (NORVASC) 5 MG tablet Take 5 mg by mouth at bedtime.      [name] aspirin EC 81 MG tablet Take 81 mg by mouth every morning.       [name] atorvastatin (LIPITOR) 20 MG tablet Take 20 mg by mouth at bedtime.      . Calcium Carbonate-Vitamin D (CALCIUM 600 + D PO) Take 1 tablet by mouth 2 (two) times daily.      . Cholecalciferol (VITAMIN D) 2000 UNITS CAPS Take 1 capsule by mouth at bedtime.       . folic acid (FOLVITE) 1 MG tablet Take 1 mg by mouth 2 (two) times daily.       . furosemide (LASIX) 20 MG tablet Take 1 tablet (20 mg total) by mouth 2 (two) times daily.  60 tablet  12  . losartan-hydrochlorothiazide (HYZAAR) 100-12.5 MG per tablet Take 1 tablet by mouth every morning.       . metoprolol (TOPROL-XL) 200 MG 24 hr tablet Take 200 mg by mouth every morning.       Vladimir Faster Glycol-Propyl Glycol (SYSTANE OP) Place 1 drop into both eyes daily as needed (Dry eyes).      . potassium chloride SA (K-DUR,KLOR-CON) 20 MEQ tablet Take 1 tablet (20 mEq total) by mouth 3 (three) times daily.  30 tablet  11  . warfarin (COUMADIN) 2.5 MG tablet Take 2.5-5 mg by mouth See admin instructions. She takes 5mg  daily except 2.5mg  on Monday, Wednesday and Friday       No current facility-administered medications for this visit.     Past Medical History  Diagnosis Date  . Osteoarthritis   . Mixed hyperlipidemia   . Coronary atherosclerosis of native coronary artery     Multivessel status post CABG, DES PLA March 2006  .  Essential hypertension, benign   . Heart block   . Syncope   . Ventricular tachycardia     Status post ICD  . Ischemic cardiomyopathy     LVEF 55% as of March 2014  . Atrial fibrillation     ROS:   All systems reviewed and negative except as noted in the HPI.   Past Surgical History  Procedure Laterality Date  . Coronary artery bypass graft      LIMA to LAD, SVG to diagonal, SVG to ramus and OM  . Cardiac defibrillator placement      Guidant device  . Yag laser application Left 09/19/8313    Procedure: YAG LASER APPLICATION;  Surgeon: Elta Guadeloupe T. Gershon Crane, MD;  Location: [facility];  Service: Ophthalmology;  Laterality: Left;  . Yag laser application Right 1/76/1607    Procedure: YAG LASER APPLICATION;  Surgeon: Elta Guadeloupe T. Gershon Crane, MD;  Location: [facility];  Service: Ophthalmology;  Laterality: Right;     Family History  Problem Relation Age of Onset  . Heart attack Father   . Heart attack Brother      History  Social History  . Marital Status: Widowed    Spouse Name: N/A    Number of Children: 2  . Years of Education: N/A   Occupational History  . Retired     Equities trader  .     Social History Main Topics  . Smoking status: Never Smoker   . Smokeless tobacco: Never Used  . Alcohol Use: No  . Drug Use: No  . Sexual Activity: No   Other Topics Concern  . Not on file   Social History Narrative  . No narrative on file     BP 142/70  Pulse 83  Wt 168 lb (76.204 kg)  Physical Exam:  Well appearing 80 yo woman,NAD HEENT: Unremarkable Neck:  No JVD, no thyromegally Back:  No CVA tenderness Lungs:  Clear with no wheezes HEART:  Regular rate rhythm, no murmurs, no rubs, no clicks Abd:  soft, positive bowel sounds, no organomegally, no rebound, no guarding Ext:  2 plus pulses, no edema, no cyanosis, no clubbing Skin:  No rashes no nodules Neuro:  CN II through XII intact, motor grossly intact    DEVICE  Normal device function.  See PaceArt for details.    Assess/Plan:

## 2014-01-11 ENCOUNTER — Ambulatory Visit: Payer: Medicare Other | Admitting: Cardiology

## 2014-01-12 ENCOUNTER — Other Ambulatory Visit (HOSPITAL_COMMUNITY): Payer: Self-pay | Admitting: Pulmonary Disease

## 2014-01-12 DIAGNOSIS — Z1231 Encounter for screening mammogram for malignant neoplasm of breast: Secondary | ICD-10-CM

## 2014-01-17 ENCOUNTER — Ambulatory Visit (HOSPITAL_COMMUNITY)
Admission: RE | Admit: 2014-01-17 | Discharge: 2014-01-17 | Disposition: A | Discharge status: Home / Self Care | Payer: Medicare Other | Source: Ambulatory Visit | Attending: Pulmonary Disease | Admitting: Pulmonary Disease

## 2014-01-17 DIAGNOSIS — Z1231 Encounter for screening mammogram for malignant neoplasm of breast: Secondary | ICD-10-CM | POA: Insufficient documentation

## 2014-01-18 ENCOUNTER — Other Ambulatory Visit: Payer: Self-pay | Admitting: Pulmonary Disease

## 2014-01-18 DIAGNOSIS — R928 Other abnormal and inconclusive findings on diagnostic imaging of breast: Secondary | ICD-10-CM

## 2014-01-31 ENCOUNTER — Ambulatory Visit (INDEPENDENT_AMBULATORY_CARE_PROVIDER_SITE_OTHER): Payer: Medicare Other | Admitting: *Deleted

## 2014-01-31 DIAGNOSIS — I482 Chronic atrial fibrillation, unspecified: Secondary | ICD-10-CM

## 2014-01-31 DIAGNOSIS — Z5181 Encounter for therapeutic drug level monitoring: Secondary | ICD-10-CM

## 2014-01-31 DIAGNOSIS — I4891 Unspecified atrial fibrillation: Secondary | ICD-10-CM

## 2014-01-31 LAB — POCT INR: INR: 2.7

## 2014-02-06 ENCOUNTER — Other Ambulatory Visit (HOSPITAL_COMMUNITY): Payer: Self-pay | Admitting: Pulmonary Disease

## 2014-02-06 ENCOUNTER — Ambulatory Visit (HOSPITAL_COMMUNITY)
Admission: RE | Admit: 2014-02-06 | Discharge: 2014-02-06 | Disposition: A | Discharge status: Home / Self Care | Payer: Medicare Other | Source: Ambulatory Visit | Attending: Pulmonary Disease | Admitting: Pulmonary Disease

## 2014-02-06 ENCOUNTER — Telehealth: Payer: Self-pay | Admitting: *Deleted

## 2014-02-06 DIAGNOSIS — R928 Other abnormal and inconclusive findings on diagnostic imaging of breast: Secondary | ICD-10-CM | POA: Diagnosis present

## 2014-02-06 DIAGNOSIS — N6310 Unspecified lump in the right breast, unspecified quadrant: Secondary | ICD-10-CM

## 2014-02-06 NOTE — Telephone Encounter (Signed)
PT IS SCHEDULED TO HAVE ANOTHER BIOPSY ON BREST 02/13/14 AND NEEDS TO KNOW WHAT TO DO ABOUT HER COUMADIN

## 2014-02-06 NOTE — Telephone Encounter (Signed)
PT IS SCHEDULED TO HAVE A BIOPSY 02/13/14 AND NEEDS TO KNOW WHAT TO DO ABOUT HER COUMADIN

## 2014-02-07 NOTE — Telephone Encounter (Signed)
Pt told to hold coumadin 3 days prior to procedure  She will take last dose of coumadin on 10/23 and restart the night of the procedure.  She will take 5mg  x 2 days then resume 3.75mg  daily except 2.5mg  on Sundays and Thursdays and come for INR check on 02/28/14.  She verbalized understanding.

## 2014-02-07 NOTE — Telephone Encounter (Signed)
Can pt hold coumadin 3 days prior to breast biopsy per the [facility] recommendation?  Procedure is to be performed at [facility] by Dr Glennon Mac.

## 2014-02-07 NOTE — Telephone Encounter (Signed)
Yes. Three to four days prior should be adequate. No clear need to bridge.

## 2014-02-13 ENCOUNTER — Other Ambulatory Visit (HOSPITAL_COMMUNITY): Payer: Medicare Other

## 2014-02-13 ENCOUNTER — Other Ambulatory Visit (HOSPITAL_COMMUNITY): Payer: Self-pay | Admitting: Pulmonary Disease

## 2014-02-13 ENCOUNTER — Ambulatory Visit (HOSPITAL_COMMUNITY)
Admission: RE | Admit: 2014-02-13 | Discharge: 2014-02-13 | Disposition: A | Discharge status: Home / Self Care | Payer: Medicare Other | Source: Ambulatory Visit | Attending: Pulmonary Disease | Admitting: Pulmonary Disease

## 2014-02-13 DIAGNOSIS — N6310 Unspecified lump in the right breast, unspecified quadrant: Secondary | ICD-10-CM

## 2014-02-28 ENCOUNTER — Ambulatory Visit (INDEPENDENT_AMBULATORY_CARE_PROVIDER_SITE_OTHER): Payer: Medicare Other | Admitting: *Deleted

## 2014-02-28 DIAGNOSIS — I482 Chronic atrial fibrillation, unspecified: Secondary | ICD-10-CM

## 2014-02-28 DIAGNOSIS — Z5181 Encounter for therapeutic drug level monitoring: Secondary | ICD-10-CM

## 2014-02-28 LAB — POCT INR: INR: 2

## 2014-03-28 ENCOUNTER — Ambulatory Visit (INDEPENDENT_AMBULATORY_CARE_PROVIDER_SITE_OTHER): Payer: Medicare Other | Admitting: *Deleted

## 2014-03-28 DIAGNOSIS — Z5181 Encounter for therapeutic drug level monitoring: Secondary | ICD-10-CM

## 2014-03-28 DIAGNOSIS — I482 Chronic atrial fibrillation, unspecified: Secondary | ICD-10-CM

## 2014-03-28 DIAGNOSIS — I4891 Unspecified atrial fibrillation: Secondary | ICD-10-CM

## 2014-03-28 LAB — POCT INR: INR: 2.8

## 2014-03-29 ENCOUNTER — Encounter (HOSPITAL_COMMUNITY): Payer: Self-pay | Admitting: Internal Medicine

## 2014-04-04 ENCOUNTER — Telehealth: Payer: Self-pay | Admitting: *Deleted

## 2014-04-04 ENCOUNTER — Encounter: Payer: Self-pay | Admitting: Cardiology

## 2014-04-04 ENCOUNTER — Ambulatory Visit (INDEPENDENT_AMBULATORY_CARE_PROVIDER_SITE_OTHER): Payer: Medicare Other | Admitting: Cardiology

## 2014-04-04 VITALS — BP 130/72 | HR 68 | Ht 62.0 in | Wt 168.0 lb

## 2014-04-04 DIAGNOSIS — I4891 Unspecified atrial fibrillation: Secondary | ICD-10-CM

## 2014-04-04 DIAGNOSIS — I5032 Chronic diastolic (congestive) heart failure: Secondary | ICD-10-CM

## 2014-04-04 DIAGNOSIS — I10 Essential (primary) hypertension: Secondary | ICD-10-CM

## 2014-04-04 DIAGNOSIS — I2510 Atherosclerotic heart disease of native coronary artery without angina pectoris: Secondary | ICD-10-CM

## 2014-04-04 LAB — BASIC METABOLIC PANEL
BUN: 29 mg/dL — ABNORMAL HIGH (ref 6–23)
CO2: 30 mEq/L (ref 19–32)
Calcium: 9.4 mg/dL (ref 8.4–10.5)
Chloride: 104 mEq/L (ref 96–112)
Creat: 1.19 mg/dL — ABNORMAL HIGH (ref 0.50–1.10)
Glucose, Bld: 106 mg/dL — ABNORMAL HIGH (ref 70–99)
Potassium: 4.5 mEq/L (ref 3.5–5.3)
SODIUM: 141 meq/L (ref 135–145)

## 2014-04-04 NOTE — Telephone Encounter (Signed)
Pt seen today by Dr. Domenic Polite, forwarded to Dr. Luan Pulling

## 2014-04-04 NOTE — Assessment & Plan Note (Signed)
No active angina symptoms status post CABG in 2006.

## 2014-04-04 NOTE — Assessment & Plan Note (Signed)
Weight is stable, no progressive leg edema on current diuretic regimen. Renal function also stable. No change to current treatment, did recommend going back to her walking regimen. Follow-up in 3 months.

## 2014-04-04 NOTE — Patient Instructions (Addendum)
Your physician recommends that you schedule a follow-up appointment in: 3 Months with Dr. Domenic Polite  We will request lab results from Dr. Luan Pulling  Your physician recommends that you continue on your current medications as directed. Please refer to the Current Medication list given to you today.   Thank you for choosing [facility]!

## 2014-04-04 NOTE — Assessment & Plan Note (Signed)
Blood pressure control is reasonable today. 

## 2014-04-04 NOTE — Assessment & Plan Note (Signed)
Continues on Coumadin.

## 2014-04-04 NOTE — Progress Notes (Signed)
Reason for visit: CAD, atrial fibrillation  Clinical Summary [name] [name] is an 80 y.o.female 80 years old last seen in September. She continues to follow with Dr. Lovena Le in the device clinic. She comes in for a routine visit. We reviewed her medications, outlined below. She reports no changes. Still with NYHA class 2 to 3 dyspnea, no chest pain. She admits that she has not been exercising regularly. We talked about getting back to her walking regimen.  Recent follow-up lab work showed potassium 4.5, BUN 29, creatinine 1.1. Renal function stable overall. She did have additional lab work per Dr. Luan Pulling, to be requested.  Weight is stable. Reports only mild right ankle edema at times. No orthopnea or PND.  Echocardiogram from May of this year showed mild LVH with LVEF 58-59%, grade 2 diastolic dysfunction with increased filling pressures, mild to moderate left atrial enlargement, mild mitral regurgitation, very mildly stenotic aortic valve, device wire in right heart, mild tricuspid regurgitation with PASP 42 mm mercury.  Allergies  Allergen Reactions  . Codeine Nausea And Vomiting    Current Outpatient Prescriptions  Medication Sig Dispense Refill  . acetaminophen (TYLENOL) 500 MG tablet Take 500 mg by mouth every 6 (six) hours as needed for pain.    [name] amLODipine (NORVASC) 5 MG tablet Take 5 mg by mouth at bedtime.    [name] aspirin EC 81 MG tablet Take 81 mg by mouth every morning.     [name] atorvastatin (LIPITOR) 20 MG tablet Take 20 mg by mouth at bedtime.    . Calcium Carbonate-Vitamin D (CALCIUM 600 + D PO) Take 1 tablet by mouth 2 (two) times daily.    . Cholecalciferol (VITAMIN D) 2000 UNITS CAPS Take 1 capsule by mouth at bedtime.     . folic acid (FOLVITE) 1 MG tablet Take 1 mg by mouth 2 (two) times daily.     . furosemide (LASIX) 20 MG tablet Take 1 tablet (20 mg total) by mouth 2 (two) times daily. 60 tablet 12  . losartan-hydrochlorothiazide (HYZAAR) 100-12.5 MG per tablet Take 1 tablet by mouth  every morning.     . metoprolol (TOPROL-XL) 200 MG 24 hr tablet Take 200 mg by mouth every morning.     Vladimir Faster Glycol-Propyl Glycol (SYSTANE OP) Place 1 drop into both eyes daily as needed (Dry eyes).    . potassium chloride SA (K-DUR,KLOR-CON) 20 MEQ tablet Take 1 tablet (20 mEq total) by mouth 3 (three) times daily. 30 tablet 11  . warfarin (COUMADIN) 2.5 MG tablet Take 2.5-5 mg by mouth See admin instructions. She takes 5mg  daily except 2.5mg  on Monday, Wednesday and Friday     No current facility-administered medications for this visit.    Past Medical History  Diagnosis Date  . Osteoarthritis   . Mixed hyperlipidemia   . Coronary atherosclerosis of native coronary artery     Multivessel status post CABG, DES PLA March 2006  . Essential hypertension, benign   . Heart block   . Syncope   . Ventricular tachycardia     Status post ICD  . Ischemic cardiomyopathy     LVEF 55% as of March 2014  . Atrial fibrillation     Past Surgical History  Procedure Laterality Date  . Coronary artery bypass graft      LIMA to LAD, SVG to diagonal, SVG to ramus and OM  . Cardiac defibrillator placement      Guidant device  . Yag laser application Left 05/29/2444    Procedure:  YAG LASER APPLICATION;  Surgeon: Elta Guadeloupe T. Gershon Crane, MD;  Location: [facility];  Service: Ophthalmology;  Laterality: Left;  . Yag laser application Right 8/75/6433    Procedure: YAG LASER APPLICATION;  Surgeon: Elta Guadeloupe T. Gershon Crane, MD;  Location: [facility];  Service: Ophthalmology;  Laterality: Right;  . Implantable cardioverter defibrillator generator change N/A 09/25/2013    Procedure: IMPLANTABLE CARDIOVERTER DEFIBRILLATOR GENERATOR CHANGE;  Surgeon: Evans Lance, MD;  Location: [facility] CATH LAB;  Service: Cardiovascular;  Laterality: N/A;  . Lead revision N/A 09/29/2013    Procedure: LEAD REVISION;  Surgeon: Deboraha Sprang, MD;  Location: [facility] CATH LAB;  Service: Cardiovascular;  Laterality: N/A;    Social History [name] [name] reports  that she has never smoked. She has never used smokeless tobacco. [name] [name] reports that she does not drink alcohol.  Review of Systems Complete review of systems negative except as otherwise outlined in the clinical summary.  Physical Examination Filed Vitals:   04/04/14 1041  BP: 130/72  Pulse: 68   Filed Weights   04/04/14 1041  Weight: 168 lb (76.204 kg)    Comfortable at rest.  HEENT: Conjunctiva and lids normal, oropharynx clear. Resolving ecchymoses left forehead and periorbital region.  Neck: Supple, no elevated JVP or carotid bruits, no thyromegaly.  Lungs: Clear to auscultation, nonlabored breathing at rest.  Cardiac: Regular rate and rhythm, no S3, soft systolic murmur, no pericardial rub.  Thorax: Well-healed device pocket site. Abdomen: Soft, nontender, bowel sounds present.  Extremities: Trace ankle edema, distal pulses 2+.  Skin: Warm and dry.  Musculoskeletal: No kyphosis.  Neuropsychiatric: Alert and oriented x3, affect grossly appropriate.   Problem List and Plan   Chronic diastolic heart failure Weight is stable, no progressive leg edema on current diuretic regimen. Renal function also stable. No change to current treatment, did recommend going back to her walking regimen. Follow-up in 3 months.  Essential hypertension, benign Blood pressure control is reasonable today.  Coronary atherosclerosis of native coronary artery No active angina symptoms status post CABG in 2006.  Atrial fibrillation Continues on Coumadin.    Satira Sark, M.D., F.A.C.C.

## 2014-04-04 NOTE — Telephone Encounter (Signed)
Lab results received and placed in Dr. Myles Gip folder.

## 2014-04-04 NOTE — Telephone Encounter (Signed)
-----   Message from Satira Sark, MD sent at 04/04/2014 10:06 AM EST ----- Reviewed. Normal potassium, creatinine 1.1 up from 1.0. Will discuss an office visit.

## 2014-04-05 ENCOUNTER — Encounter: Payer: Self-pay | Admitting: Cardiology

## 2014-04-05 ENCOUNTER — Encounter: Payer: Self-pay | Admitting: Internal Medicine

## 2014-04-05 ENCOUNTER — Ambulatory Visit (INDEPENDENT_AMBULATORY_CARE_PROVIDER_SITE_OTHER): Payer: Medicare Other | Admitting: *Deleted

## 2014-04-05 DIAGNOSIS — I442 Atrioventricular block, complete: Secondary | ICD-10-CM

## 2014-04-05 DIAGNOSIS — I4729 Other ventricular tachycardia: Secondary | ICD-10-CM

## 2014-04-05 DIAGNOSIS — I472 Ventricular tachycardia: Secondary | ICD-10-CM

## 2014-04-05 NOTE — Progress Notes (Signed)
Remote ICD transmission.   

## 2014-04-06 LAB — MDC_IDC_ENUM_SESS_TYPE_REMOTE
Brady Statistic AP VP Percent: 99.2 %
Brady Statistic AP VS Percent: 0 %
Brady Statistic AS VP Percent: 0.8 %
Brady Statistic AS VS Percent: 0 %
Brady Statistic RA Percent Paced: 99.2 %
Brady Statistic RV Percent Paced: 100 %
Date Time Interrogation Session: 20151217144057
HIGH POWER IMPEDANCE MEASURED VALUE: 53 Ohm
HIGH POWER IMPEDANCE MEASURED VALUE: 71 Ohm
Lead Channel Impedance Value: 361 Ohm
Lead Channel Impedance Value: 399 Ohm
Lead Channel Impedance Value: 456 Ohm
Lead Channel Pacing Threshold Amplitude: 0.75 V
Lead Channel Pacing Threshold Pulse Width: 0.4 ms
Lead Channel Sensing Intrinsic Amplitude: 9.5 mV
Lead Channel Sensing Intrinsic Amplitude: 9.5 mV
Lead Channel Setting Pacing Amplitude: 2 V
Lead Channel Setting Sensing Sensitivity: 0.3 mV
MDC IDC MSMT BATTERY REMAINING LONGEVITY: 92 mo
MDC IDC MSMT BATTERY VOLTAGE: 3.01 V
MDC IDC MSMT LEADCHNL RA SENSING INTR AMPL: 3.125 mV
MDC IDC MSMT LEADCHNL RA SENSING INTR AMPL: 3.125 mV
MDC IDC SET LEADCHNL RV PACING AMPLITUDE: 2.5 V
MDC IDC SET LEADCHNL RV PACING PULSEWIDTH: 0.4 ms
MDC IDC SET ZONE DETECTION INTERVAL: 300 ms
MDC IDC SET ZONE DETECTION INTERVAL: 350 ms
Zone Setting Detection Interval: 350 ms
Zone Setting Detection Interval: 450 ms

## 2014-04-17 ENCOUNTER — Encounter: Payer: Self-pay | Admitting: Cardiology

## 2014-04-26 ENCOUNTER — Telehealth: Payer: Self-pay | Admitting: *Deleted

## 2014-04-26 NOTE — Telephone Encounter (Signed)
Pt was started on Keflex bid on Monday 04/23/14.  She will finish 05/02/14.  Told pt to continue current dose of coumadin. Told her she could eat extra greens while on Keflex.  Has appt for INR check on 05/09/14.  Pt verbalized understanding.

## 2014-05-03 ENCOUNTER — Other Ambulatory Visit: Payer: Self-pay | Admitting: Cardiology

## 2014-05-03 MED ORDER — ATORVASTATIN CALCIUM 20 MG PO TABS: 20.0000 mg | ORAL_TABLET | Freq: Every day | ORAL | Status: DC

## 2014-05-03 NOTE — Telephone Encounter (Signed)
Received fax refill request  Rx # [id number] Medication:  Atorvastatin 20 mg tablets Qty 30 Sig:  Take one tablet by mouth once daily Physician:  Domenic Polite

## 2014-05-04 ENCOUNTER — Telehealth: Payer: Self-pay | Admitting: Cardiology

## 2014-05-04 NOTE — Telephone Encounter (Signed)
Has medication question about her coumadin. She was taking antibiotic and had a reaction, had to get a cortisone shoot today and start new meds. She would like to know if she needs to change her ccr dosage

## 2014-05-07 NOTE — Telephone Encounter (Signed)
Spoke with pt.  Had an allergic reaction to Keflex last week (rash, itching).  Saw Dr Luan Pulling.  Was given a cortisone shot and a prednisone taper. (40mg  x 3, 30mg  x 3, 20mg  x 3, 10mg  x 3)   Is starting her first day of 30mg  today.  Has an INR appt already scheduled for 05/09/14.  Pt has already taken coumadin today.  Told pt to hold coumadin tomorrow and come for INR check on 1/20.  Pt verbalized understanding.

## 2014-05-09 ENCOUNTER — Ambulatory Visit (INDEPENDENT_AMBULATORY_CARE_PROVIDER_SITE_OTHER): Payer: Medicare Other | Admitting: *Deleted

## 2014-05-09 DIAGNOSIS — I4891 Unspecified atrial fibrillation: Secondary | ICD-10-CM

## 2014-05-09 DIAGNOSIS — I482 Chronic atrial fibrillation, unspecified: Secondary | ICD-10-CM

## 2014-05-09 DIAGNOSIS — Z5181 Encounter for therapeutic drug level monitoring: Secondary | ICD-10-CM

## 2014-05-09 LAB — POCT INR: INR: 2.5

## 2014-05-21 ENCOUNTER — Other Ambulatory Visit (HOSPITAL_COMMUNITY): Payer: Self-pay | Admitting: Pulmonary Disease

## 2014-05-21 DIAGNOSIS — N63 Unspecified lump in unspecified breast: Secondary | ICD-10-CM

## 2014-05-23 ENCOUNTER — Ambulatory Visit (INDEPENDENT_AMBULATORY_CARE_PROVIDER_SITE_OTHER): Payer: Medicare Other | Admitting: *Deleted

## 2014-05-23 DIAGNOSIS — I482 Chronic atrial fibrillation, unspecified: Secondary | ICD-10-CM

## 2014-05-23 DIAGNOSIS — Z5181 Encounter for therapeutic drug level monitoring: Secondary | ICD-10-CM

## 2014-05-23 DIAGNOSIS — I4891 Unspecified atrial fibrillation: Secondary | ICD-10-CM

## 2014-05-23 LAB — POCT INR: INR: 4.7

## 2014-05-30 ENCOUNTER — Other Ambulatory Visit (HOSPITAL_COMMUNITY): Payer: Self-pay | Admitting: Pulmonary Disease

## 2014-05-30 ENCOUNTER — Ambulatory Visit (HOSPITAL_COMMUNITY)
Admission: RE | Admit: 2014-05-30 | Discharge: 2014-05-30 | Disposition: A | Discharge status: Home / Self Care | Payer: Medicare Other | Source: Ambulatory Visit | Attending: Pulmonary Disease | Admitting: Pulmonary Disease

## 2014-05-30 DIAGNOSIS — R0602 Shortness of breath: Secondary | ICD-10-CM | POA: Diagnosis not present

## 2014-05-30 DIAGNOSIS — I517 Cardiomegaly: Secondary | ICD-10-CM | POA: Insufficient documentation

## 2014-06-06 ENCOUNTER — Ambulatory Visit (INDEPENDENT_AMBULATORY_CARE_PROVIDER_SITE_OTHER): Payer: Medicare Other | Admitting: *Deleted

## 2014-06-06 DIAGNOSIS — Z5181 Encounter for therapeutic drug level monitoring: Secondary | ICD-10-CM

## 2014-06-06 DIAGNOSIS — I480 Paroxysmal atrial fibrillation: Secondary | ICD-10-CM

## 2014-06-06 DIAGNOSIS — I482 Chronic atrial fibrillation, unspecified: Secondary | ICD-10-CM

## 2014-06-06 LAB — POCT INR: INR: 4.3

## 2014-06-09 ENCOUNTER — Encounter (HOSPITAL_COMMUNITY): Payer: Self-pay | Admitting: Emergency Medicine

## 2014-06-09 ENCOUNTER — Emergency Department (HOSPITAL_COMMUNITY): Payer: Medicare Other

## 2014-06-09 ENCOUNTER — Emergency Department (HOSPITAL_COMMUNITY)
Admission: EM | Admit: 2014-06-09 | Discharge: 2014-06-09 | Disposition: A | Discharge status: Home / Self Care | Payer: Medicare Other | Attending: Emergency Medicine | Admitting: Emergency Medicine

## 2014-06-09 DIAGNOSIS — I10 Essential (primary) hypertension: Secondary | ICD-10-CM | POA: Diagnosis not present

## 2014-06-09 DIAGNOSIS — I472 Ventricular tachycardia: Secondary | ICD-10-CM | POA: Diagnosis not present

## 2014-06-09 DIAGNOSIS — I2510 Atherosclerotic heart disease of native coronary artery without angina pectoris: Secondary | ICD-10-CM | POA: Insufficient documentation

## 2014-06-09 DIAGNOSIS — R609 Edema, unspecified: Secondary | ICD-10-CM | POA: Insufficient documentation

## 2014-06-09 DIAGNOSIS — R0609 Other forms of dyspnea: Secondary | ICD-10-CM

## 2014-06-09 DIAGNOSIS — I4891 Unspecified atrial fibrillation: Secondary | ICD-10-CM | POA: Diagnosis not present

## 2014-06-09 DIAGNOSIS — Z79899 Other long term (current) drug therapy: Secondary | ICD-10-CM | POA: Insufficient documentation

## 2014-06-09 DIAGNOSIS — R0600 Dyspnea, unspecified: Secondary | ICD-10-CM | POA: Insufficient documentation

## 2014-06-09 DIAGNOSIS — R0602 Shortness of breath: Secondary | ICD-10-CM | POA: Diagnosis present

## 2014-06-09 DIAGNOSIS — Z7982 Long term (current) use of aspirin: Secondary | ICD-10-CM | POA: Diagnosis not present

## 2014-06-09 DIAGNOSIS — Z7901 Long term (current) use of anticoagulants: Secondary | ICD-10-CM | POA: Diagnosis not present

## 2014-06-09 DIAGNOSIS — E782 Mixed hyperlipidemia: Secondary | ICD-10-CM | POA: Insufficient documentation

## 2014-06-09 DIAGNOSIS — M1990 Unspecified osteoarthritis, unspecified site: Secondary | ICD-10-CM | POA: Insufficient documentation

## 2014-06-09 DIAGNOSIS — Z951 Presence of aortocoronary bypass graft: Secondary | ICD-10-CM | POA: Diagnosis not present

## 2014-06-09 LAB — COMPREHENSIVE METABOLIC PANEL
ALT: 18 U/L (ref 0–35)
ANION GAP: 8 (ref 5–15)
AST: 18 U/L (ref 0–37)
Albumin: 4.3 g/dL (ref 3.5–5.2)
Alkaline Phosphatase: 51 U/L (ref 39–117)
BUN: 36 mg/dL — ABNORMAL HIGH (ref 6–23)
CALCIUM: 9.8 mg/dL (ref 8.4–10.5)
CO2: 27 mmol/L (ref 19–32)
Chloride: 108 mmol/L (ref 96–112)
Creatinine, Ser: 1.4 mg/dL — ABNORMAL HIGH (ref 0.50–1.10)
GFR calc non Af Amer: 34 mL/min — ABNORMAL LOW (ref >90)
GFR, EST AFRICAN AMERICAN: 40 mL/min — AB (ref >90)
GLUCOSE: 85 mg/dL (ref 70–99)
POTASSIUM: 4.4 mmol/L (ref 3.5–5.1)
SODIUM: 143 mmol/L (ref 135–145)
Total Bilirubin: 1.6 mg/dL — ABNORMAL HIGH (ref 0.3–1.2)
Total Protein: 6.9 g/dL (ref 6.0–8.3)

## 2014-06-09 LAB — CBC WITH DIFFERENTIAL/PLATELET
BASOS PCT: 0 % (ref 0–1)
Basophils Absolute: 0 10*3/uL (ref 0.0–0.1)
Eosinophils Absolute: 0.1 10*3/uL (ref 0.0–0.7)
Eosinophils Relative: 1 % (ref 0–5)
HEMATOCRIT: 35.5 % — AB (ref 36.0–46.0)
Hemoglobin: 11.3 g/dL — ABNORMAL LOW (ref 12.0–15.0)
LYMPHS ABS: 2.2 10*3/uL (ref 0.7–4.0)
Lymphocytes Relative: 25 % (ref 12–46)
MCH: 29.7 pg (ref 26.0–34.0)
MCHC: 31.8 g/dL (ref 30.0–36.0)
MCV: 93.4 fL (ref 78.0–100.0)
MONO ABS: 1.2 10*3/uL — AB (ref 0.1–1.0)
MONOS PCT: 13 % — AB (ref 3–12)
NEUTROS PCT: 61 % (ref 43–77)
Neutro Abs: 5.5 10*3/uL (ref 1.7–7.7)
PLATELETS: 242 10*3/uL (ref 150–400)
RBC: 3.8 MIL/uL — AB (ref 3.87–5.11)
RDW: 16.1 % — ABNORMAL HIGH (ref 11.5–15.5)
WBC: 9.1 10*3/uL (ref 4.0–10.5)

## 2014-06-09 LAB — PROTIME-INR
INR: 2.24 — ABNORMAL HIGH (ref 0.00–1.49)
PROTHROMBIN TIME: 25 s — AB (ref 11.6–15.2)

## 2014-06-09 LAB — TROPONIN I

## 2014-06-09 LAB — BRAIN NATRIURETIC PEPTIDE: B Natriuretic Peptide: 348 pg/mL — ABNORMAL HIGH (ref 0.0–100.0)

## 2014-06-09 NOTE — ED Notes (Signed)
Ambulated in hallway. HR stable. Maintained Spo2 of 100% throughout. Complained of shortness of breath with walking.

## 2014-06-09 NOTE — ED Notes (Signed)
MD Pollina at bedside discussing plan with patient

## 2014-06-09 NOTE — ED Provider Notes (Signed)
CSN: [id number]     Arrival date & time 06/09/14  1306 History  This chart was scribed for [name] [name], * by Jeanell Sparrow, ED Scribe. This patient was seen in room [facility] and the patient's care was started at 1:24 PM.   Chief Complaint  Patient presents with  . Shortness of Breath   The history is provided by the patient. No language interpreter was used.   HPI Comments: [name] [name] is a 81 y.o. female with a hx of CHF who presents to the Emergency Department complaining of moderate intermittent SOB that started about 2 weeks ago. She reports that she was seen about a week ago by Dr. Luan Pulling and was put increased lasix for 4 days. She states that her SOB has been getting worse. She reports that the SOB is mainly present during exertion. She denies any leg swelling or chest pain.   Past Medical History  Diagnosis Date  . Osteoarthritis   . Mixed hyperlipidemia   . Coronary atherosclerosis of native coronary artery     Multivessel status post CABG, DES PLA March 2006  . Essential hypertension, benign   . Heart block   . Syncope   . Ventricular tachycardia     Status post ICD  . Ischemic cardiomyopathy     LVEF 55% as of March 2014  . Atrial fibrillation    Past Surgical History  Procedure Laterality Date  . Coronary artery bypass graft      LIMA to LAD, SVG to diagonal, SVG to ramus and OM  . Cardiac defibrillator placement      Guidant device  . Yag laser application Left 0/09/3014    Procedure: YAG LASER APPLICATION;  Surgeon: Elta Guadeloupe T. Gershon Crane, MD;  Location: [facility];  Service: Ophthalmology;  Laterality: Left;  . Yag laser application Right 0/01/9322    Procedure: YAG LASER APPLICATION;  Surgeon: Elta Guadeloupe T. Gershon Crane, MD;  Location: [facility];  Service: Ophthalmology;  Laterality: Right;  . Implantable cardioverter defibrillator generator change N/A 09/25/2013    Procedure: IMPLANTABLE CARDIOVERTER DEFIBRILLATOR GENERATOR CHANGE;  Surgeon: Evans Lance, MD;  Location: [facility]  CATH LAB;  Service: Cardiovascular;  Laterality: N/A;  . Lead revision N/A 09/29/2013    Procedure: LEAD REVISION;  Surgeon: Deboraha Sprang, MD;  Location: [facility] CATH LAB;  Service: Cardiovascular;  Laterality: N/A;   Family History  Problem Relation Age of Onset  . Heart attack Father   . Heart attack Brother    History  Substance Use Topics  . Smoking status: Never Smoker   . Smokeless tobacco: Never Used  . Alcohol Use: No   OB History    Gravida Para Term Preterm AB TAB SAB Ectopic Multiple Living   2 2 2       2      Review of Systems  Respiratory: Positive for shortness of breath.   Cardiovascular: Negative for chest pain and leg swelling.  All other systems reviewed and are negative.  Allergies  Codeine and Keflex  Home Medications   Prior to Admission medications   Medication Sig Start Date End Date Taking? Authorizing Provider  acetaminophen (TYLENOL) 500 MG tablet Take 500 mg by mouth daily.    Yes Historical Provider, MD  amLODipine (NORVASC) 5 MG tablet TAKE ONE TABLET BY MOUTH ONCE DAILY. 05/03/14  Yes Satira Sark, MD  aspirin EC 81 MG tablet Take 81 mg by mouth every morning.    Yes Historical Provider, MD  atorvastatin (LIPITOR)  20 MG tablet Take 1 tablet (20 mg total) by mouth at bedtime. 05/03/14  Yes Satira Sark, MD  Calcium Carbonate-Vitamin D (CALCIUM 600 + D PO) Take 1 tablet by mouth 2 (two) times daily.   Yes Historical Provider, MD  Cholecalciferol (VITAMIN D) 2000 UNITS CAPS Take 1 capsule by mouth at bedtime.    Yes Historical Provider, MD  folic acid (FOLVITE) 1 MG tablet Take 1 mg by mouth 2 (two) times daily.    Yes Historical Provider, MD  furosemide (LASIX) 20 MG tablet Take 1 tablet (20 mg total) by mouth 2 (two) times daily. 04/13/13  Yes Alonza Bogus, MD  losartan-hydrochlorothiazide (HYZAAR) 100-12.5 MG per tablet Take 1 tablet by mouth every morning.  03/29/13  Yes Historical Provider, MD  metoprolol (TOPROL-XL) 200 MG 24 hr tablet  Take 200 mg by mouth every morning.    Yes Historical Provider, MD  potassium chloride SA (K-DUR,KLOR-CON) 20 MEQ tablet Take 20 mEq by mouth 3 (three) times daily.   Yes Historical Provider, MD  warfarin (COUMADIN) 2.5 MG tablet Take 2.5-3.75 mg by mouth daily. Patient takes 2.5mg  daily except On Tues.,Thurs.,Sat.,patient takes 3.75   Yes Historical Provider, MD  Polyethyl Glycol-Propyl Glycol (SYSTANE OP) Place 1 drop into both eyes daily as needed (Dry eyes).    Historical Provider, MD  potassium chloride SA (K-DUR,KLOR-CON) 20 MEQ tablet Take 1 tablet (20 mEq total) by mouth 3 (three) times daily. Patient not taking: Reported on 06/09/2014 04/13/13 04/13/14  Alonza Bogus, MD   BP 147/60 mmHg  Pulse 81  Temp(Src) 98.4 F (36.9 C) (Oral)  Resp 22  Ht 5\' 1"  (1.549 m)  Wt 169 lb (76.658 kg)  BMI 31.95 kg/m2  SpO2 97% Physical Exam  Constitutional: She is oriented to person, place, and time. She appears well-developed and well-nourished. No distress.  HENT:  Head: Normocephalic and atraumatic.  Right Ear: Hearing normal.  Left Ear: Hearing normal.  Nose: Nose normal.  Mouth/Throat: Oropharynx is clear and moist and mucous membranes are normal.  Eyes: Conjunctivae and EOM are normal. Pupils are equal, round, and reactive to light.  Neck: Normal range of motion. Neck supple.  Cardiovascular: Regular rhythm, S1 normal and S2 normal.  Exam reveals no gallop and no friction rub.   No murmur heard. JVD present.   Pulmonary/Chest: Effort normal and breath sounds normal. No respiratory distress. She exhibits no tenderness.  Faijnt crackles at the bases.   Abdominal: Soft. Normal appearance and bowel sounds are normal. There is no hepatosplenomegaly. There is no tenderness. There is no rebound, no guarding, no tenderness at McBurney's point and negative Murphy's sign. No hernia.  Musculoskeletal: Normal range of motion. She exhibits edema.  Edema on LE.  Neurological: She is alert and  oriented to person, place, and time. She has normal strength. No cranial nerve deficit or sensory deficit. Coordination normal. GCS eye subscore is 4. GCS verbal subscore is 5. GCS motor subscore is 6.  Skin: Skin is warm, dry and intact. No rash noted. No cyanosis.  Psychiatric: She has a normal mood and affect. Her speech is normal and behavior is normal. Thought content normal.  Nursing note and vitals reviewed.   ED Course  Procedures (including critical care time) DIAGNOSTIC STUDIES: Oxygen Saturation is 97% on RA, normal by my interpretation.    COORDINATION OF CARE: 1:28 PM- Pt advised of plan for treatment which includes radiology and labs and pt agrees.  Labs Review Labs Reviewed  CBC WITH DIFFERENTIAL/PLATELET - Abnormal; Notable for the following:    RBC 3.80 (*)    Hemoglobin 11.3 (*)    HCT 35.5 (*)    RDW 16.1 (*)    Monocytes Relative 13 (*)    Monocytes Absolute 1.2 (*)    All other components within normal limits  COMPREHENSIVE METABOLIC PANEL - Abnormal; Notable for the following:    BUN 36 (*)    Creatinine, Ser 1.40 (*)    Total Bilirubin 1.6 (*)    GFR calc non Af Amer 34 (*)    GFR calc Af Amer 40 (*)    All other components within normal limits  BRAIN NATRIURETIC PEPTIDE - Abnormal; Notable for the following:    B Natriuretic Peptide 348.0 (*)    All other components within normal limits  PROTIME-INR - Abnormal; Notable for the following:    Prothrombin Time 25.0 (*)    INR 2.24 (*)    All other components within normal limits  TROPONIN I    Imaging Review Dg Chest 2 View  06/09/2014   CLINICAL DATA:  Shortness of breath since last week, initial evaluation  EXAM: CHEST  2 VIEW  COMPARISON:  05/30/2014  FINDINGS: Mild to moderate cardiac enlargement is stable. Status post previous CABG. Cardiac pacer in unchanged position. Vascular pattern normal. Minimal blunting left costophrenic angle stable. Minimal blunting right costophrenic angle new from prior  study.  IMPRESSION: Extremely tiny right pleural effusion new from prior study. Stable cardiac enlargement without evidence of pulmonary edema.   Electronically Signed   By: Skipper Cliche M.D.   On: 06/09/2014 15:10     EKG Interpretation   Date/Time:  Saturday June 09 2014 13:25:13 EST Ventricular Rate:  67 PR Interval:    QRS Duration: 186 QT Interval:  483 QTC Calculation: 510 R Axis:   -85 Text Interpretation:  VENTRICULAR PACING Confirmed by Betsey Holiday  MD,  CHRISTOPHER [phone]) on 06/09/2014 1:32:16 PM      MDM   Final diagnoses:  Dyspnea on exertion   Patient presents to the ER for evaluation of 2 weeks of difficulty breathing. Patient reports that she gets very short of breath when she walks or exerts herself. She has no concomitant chest pain. Patient reports a history of heart disease, status post bypass surgery. She reports that she was primarily short of breath with her previous heart disease, never had chest pain. Patient did see her primary care doctor, Dr. Luan Pulling this past week. She had her Lasix increased for 4 days but did not have any improvement.  Patient's workup here in the ER is unremarkable. She has normal vital signs. Chest x-ray does not suggest congestive heart failure or edema. Her BNP is very slightly elevated at 348. Her BUN and creatinine are elevated over her baseline from last month as well, indicating the increased Lasix. I do not feel that this shortness of breath secondary to decompensated congestive heart failure at this time. Recurrent cardiac blockage and anginal equivalent is considered. She was able to ambulate here in the ER. There is no hypoxia associated with shortness of breath.  Patient is on Coumadin for atrial fibrillation. Her INR has actually been supratherapeutic over the past 2 weeks. Because of this, I significantly doubt PE as a cause of her symptoms. She does not have tachycardia or hypoxia. Her symptoms are not present at rest, only  with exertion. This does not support PE.  Case was discussed with cardiology, Dr. Haroldine Laws. He  did agree that the workup here in the ER was appropriate that the patient could safely be discharged to follow-up with Dr. Domenic Polite in the office this week. She has follow-up scheduled on Thursday, they will attempt to move her appointment earlier in the week. Patient was counseled to return to the ER called 911 for any chest pain or worsening shortness of breath.  I personally performed the services described in this documentation, which was scribed in my presence. The recorded information has been reviewed and is accurate.      [name] Greek, MD 06/09/14 [phone]

## 2014-06-09 NOTE — Discharge Instructions (Signed)

## 2014-06-09 NOTE — ED Notes (Signed)
Patient c/o shortness of breath that started 2 weeks ago and has progressively gotten worse. Per patient seen by Dr Luan Pulling 1 week ago, had chest x-ray. Patient told to increase Lasix for 4 days but denies any improvement. Denies any COPD but has hx of CHF. Denies any chest pain.

## 2014-06-12 ENCOUNTER — Ambulatory Visit (INDEPENDENT_AMBULATORY_CARE_PROVIDER_SITE_OTHER): Payer: Medicare Other | Admitting: Cardiology

## 2014-06-12 ENCOUNTER — Encounter: Payer: Self-pay | Admitting: Cardiology

## 2014-06-12 ENCOUNTER — Ambulatory Visit (INDEPENDENT_AMBULATORY_CARE_PROVIDER_SITE_OTHER): Payer: Medicare Other | Admitting: *Deleted

## 2014-06-12 ENCOUNTER — Ambulatory Visit (HOSPITAL_COMMUNITY): Payer: Medicare Other

## 2014-06-12 ENCOUNTER — Other Ambulatory Visit (HOSPITAL_COMMUNITY): Payer: Medicare Other

## 2014-06-12 ENCOUNTER — Encounter: Payer: Self-pay | Admitting: *Deleted

## 2014-06-12 ENCOUNTER — Telehealth: Payer: Self-pay | Admitting: Cardiology

## 2014-06-12 VITALS — BP 135/77 | HR 75 | Ht 61.0 in | Wt 169.8 lb

## 2014-06-12 DIAGNOSIS — I482 Chronic atrial fibrillation, unspecified: Secondary | ICD-10-CM

## 2014-06-12 DIAGNOSIS — I2510 Atherosclerotic heart disease of native coronary artery without angina pectoris: Secondary | ICD-10-CM

## 2014-06-12 DIAGNOSIS — I480 Paroxysmal atrial fibrillation: Secondary | ICD-10-CM

## 2014-06-12 DIAGNOSIS — R0602 Shortness of breath: Secondary | ICD-10-CM

## 2014-06-12 DIAGNOSIS — Z8679 Personal history of other diseases of the circulatory system: Secondary | ICD-10-CM

## 2014-06-12 DIAGNOSIS — Z5181 Encounter for therapeutic drug level monitoring: Secondary | ICD-10-CM

## 2014-06-12 LAB — POCT INR: INR: 2.5

## 2014-06-12 NOTE — Telephone Encounter (Signed)
Lexiscan Cardiolite on medications (ASAP-this week as add on per [facility]) dx:SOB & CAD Schedule at [place] feb 26 , 2016  Register at 6:45 at radiology

## 2014-06-12 NOTE — Progress Notes (Signed)
Cardiology Office Note  Date: 06/12/2014   ID: Anwar, Crill [birth year], MRN [id number]  PCP: Alonza Bogus, MD  Primary Cardiologist: Rozann Lesches, MD   Chief Complaint  Patient presents with  . ER follow-up  . Coronary Artery Disease  . Atrial Fibrillation    History of Present Illness: [name] [name] is an 81 y.o. female last seen in December 2015 for a routine visit. Interval records reviewed including recent ER visit on February 20. She presented with increasing shortness of breath over a period of a few weeks, Lasix had been increased as an outpatient by Dr. Luan Pulling. Workup in the ER did not reveal clear evidence of ACS or necessarily decompensated heart failure, studies outlined below. Office visit was scheduled with me.  She is here with her son today. She indicates progressive dyspnea and fatigue on exertion over the last several weeks, worst in the last few weeks. No specific exertional chest pain or nitroglycerin use.  She has multivessel CAD status post CABG in 1998 with bypass graft anatomy noted below, previously followed long-term by Dr. Lia Foyer, and status post DES to the posterolateral system in 2006. She has not had any interval ischemic testing in the last few years. Today we discussed the possibility that she has manifested progressive CAD/graft disease as a cause for her exertional shortness of breath. We discussed options for follow-up testing including both noninvasive and invasive techniques. At this point she was most comfortable with a repeat stress test evaluation.  Past Medical History  Diagnosis Date  . Osteoarthritis   . Mixed hyperlipidemia   . Coronary atherosclerosis of native coronary artery     Multivessel status post CABG, DES PLA March 2006  . Essential hypertension, benign   . Heart block   . Syncope   . Ventricular tachycardia     Status post ICD  . Ischemic cardiomyopathy     LVEF 55% as of March 2014  . Atrial fibrillation      Past Surgical History  Procedure Laterality Date  . Coronary artery bypass graft      LIMA to LAD, SVG to diagonal, SVG to ramus and OM  . Cardiac defibrillator placement      Guidant device  . Yag laser application Left 12/28/3714    Procedure: YAG LASER APPLICATION;  Surgeon: Elta Guadeloupe T. Gershon Crane, MD;  Location: [facility];  Service: Ophthalmology;  Laterality: Left;  . Yag laser application Right 9/67/8938    Procedure: YAG LASER APPLICATION;  Surgeon: Elta Guadeloupe T. Gershon Crane, MD;  Location: [facility];  Service: Ophthalmology;  Laterality: Right;  . Implantable cardioverter defibrillator generator change N/A 09/25/2013    Procedure: IMPLANTABLE CARDIOVERTER DEFIBRILLATOR GENERATOR CHANGE;  Surgeon: Evans Lance, MD;  Location: [facility] CATH LAB;  Service: Cardiovascular;  Laterality: N/A;  . Lead revision N/A 09/29/2013    Procedure: LEAD REVISION;  Surgeon: Deboraha Sprang, MD;  Location: [facility] CATH LAB;  Service: Cardiovascular;  Laterality: N/A;    Current Outpatient Prescriptions  Medication Sig Dispense Refill  . acetaminophen (TYLENOL) 500 MG tablet Take 500 mg by mouth daily.     [name] amLODipine (NORVASC) 5 MG tablet TAKE ONE TABLET BY MOUTH ONCE DAILY. 30 tablet 6  . aspirin EC 81 MG tablet Take 81 mg by mouth every morning.     [name] atorvastatin (LIPITOR) 20 MG tablet Take 1 tablet (20 mg total) by mouth at bedtime. 90 tablet 3  . Calcium Carbonate-Vitamin D (CALCIUM 600 + D PO)  Take 1 tablet by mouth 2 (two) times daily.    . Cholecalciferol (VITAMIN D) 2000 UNITS CAPS Take 1 capsule by mouth at bedtime.     . folic acid (FOLVITE) 1 MG tablet Take 1 mg by mouth 2 (two) times daily.     . furosemide (LASIX) 20 MG tablet Take 1 tablet (20 mg total) by mouth 2 (two) times daily. 60 tablet 12  . losartan-hydrochlorothiazide (HYZAAR) 100-12.5 MG per tablet Take 1 tablet by mouth every morning.     . metoprolol (TOPROL-XL) 200 MG 24 hr tablet Take 200 mg by mouth every morning.     Vladimir Faster Glycol-Propyl Glycol  (SYSTANE OP) Place 1 drop into both eyes daily as needed (Dry eyes).    . potassium chloride SA (K-DUR,KLOR-CON) 20 MEQ tablet Take 20 mEq by mouth 3 (three) times daily.    [name] warfarin (COUMADIN) 2.5 MG tablet Take 2.5-3.75 mg by mouth daily. Patient takes 2.5mg  daily except On Tues.,Thurs.,Sat.,patient takes 3.75 LISA MANAGES     No current facility-administered medications for this visit.    Allergies:  Codeine and Keflex   Social History: The patient  reports that she has never smoked. She has never used smokeless tobacco. She reports that she does not drink alcohol or use illicit drugs.   ROS:  Please see the history of present illness. Otherwise, complete review of systems is positive for fatigue.  All other systems are reviewed and negative.    Physical Exam: VS:  BP 135/77 mmHg  Pulse 75  Ht 5\' 1"  (1.549 m)  Wt 169 lb 12.8 oz (77.021 kg)  BMI 32.10 kg/m2  SpO2 99%, BMI Body mass index is 32.1 kg/(m^2).  Wt Readings from Last 3 Encounters:  06/12/14 169 lb 12.8 oz (77.021 kg)  06/09/14 169 lb (76.658 kg)  04/04/14 168 lb (76.204 kg)     Appears comfortable at rest.  HEENT: Conjunctiva and lids normal, oropharynx clear. Neck: Supple, no elevated JVP or carotid bruits, no thyromegaly.  Lungs: Clear to auscultation, nonlabored breathing at rest.  Cardiac: Regular rate and rhythm, no S3, soft systolic murmur, no pericardial rub.  Thorax: Well-healed device pocket site. Abdomen: Soft, nontender, bowel sounds present.  Extremities: Trace ankle edema, distal pulses 2+.  Skin: Warm and dry.  Musculoskeletal: No kyphosis.  Neuropsychiatric: Alert and oriented x3, affect grossly appropriate.   ECG: ECG is not ordered today. Tracing from February 20 reviewed showing ventricular paced rhythm.   Recent Labwork:  Lab work done on February 20 showed INR 2.2, hemoglobin 11.3, platelets 242, potassium 4.4, BUN 36, creatinine 1.4, AST 18, ALT 18, troponin I less than 0.03,  BNP 348.  Lipid panel from December 2015 showed cholesterol 123, triglycerides 140, HDL 34, and LDL 61.  Other Studies Reviewed Today:  1. Chest x-ray 06/09/2014: EXAM: CHEST 2 VIEW  COMPARISON: 05/30/2014  FINDINGS: Mild to moderate cardiac enlargement is stable. Status post previous CABG. Cardiac pacer in unchanged position. Vascular pattern normal. Minimal blunting left costophrenic angle stable. Minimal blunting right costophrenic angle new from prior study.  IMPRESSION: Extremely tiny right pleural effusion new from prior study. Stable cardiac enlargement without evidence of pulmonary edema.  2. Echocardiogram from May 2015 showed mild LVH with LVEF 44-31%, grade 2 diastolic dysfunction with increased filling pressures, mild to moderate left atrial enlargement, mild mitral regurgitation, very mildly stenotic aortic valve, device wire in right heart, mild tricuspid regurgitation with PASP 42 mm mercury.  Assessment and Plan:  1.  Progressive exertional fatigue and shortness of breath, possibly anginal equivalent with history of multivessel CAD status post previous CABG and subsequent percutaneous intervention as outlined above. Recent ER visit and evaluation noted, no clear evidence of ACS by enzymes at that time. I have discussed the situation and reviewed records with the patient and her son today. Plan is to proceed with a Lexiscan Cardiolite on medical therapy to assess ischemic burden and help guide the next step. I have discussed proceeding to cardiac catheterization with her as well, and she is tentatively in agreement if necessary. Office follow-up arranged.  2. Multivessel CAD status post CABG in 1998 and DES to the posterolateral system in 2006.  3. Atrial fibrillation, on Coumadin.  4. History of ischemic cardiomyopathy and previous VT status post ICD placement. LVEF now normal range.  Current medicines are reviewed at length with the patient today.  The patient  does not have concerns regarding medicines.    Orders Placed This Encounter  Procedures  . NM Myocar Multi W/Spect W/Wall Motion / EF  . Myocardial Perfusion Imaging    Disposition: FU with me in 1-2 weeks.   Signed, Satira Sark, MD, [facility] 06/12/2014 2:43 PM    [facility] [place], [place], [place] [address] Phone: [phone]; Fax: [phone]

## 2014-06-12 NOTE — Patient Instructions (Signed)
Your physician recommends that you schedule a follow-up appointment in: 1-2 weeks. Your physician recommends that you continue on your current medications as directed. Please refer to the Current Medication list given to you today. Your physician has requested that you have a lexiscan myoview. For further information please visit [email or web address]. Please follow instruction sheet, as given.  Thank you for choosing [facility]!

## 2014-06-13 NOTE — Telephone Encounter (Signed)
Approved see referral

## 2014-06-14 ENCOUNTER — Ambulatory Visit: Payer: Medicare Other | Admitting: Cardiology

## 2014-06-15 ENCOUNTER — Encounter (HOSPITAL_COMMUNITY)
Admission: RE | Admit: 2014-06-15 | Discharge: 2014-06-15 | Disposition: A | Discharge status: Home / Self Care | Payer: Medicare Other | Source: Ambulatory Visit | Attending: Cardiology | Admitting: Cardiology

## 2014-06-15 ENCOUNTER — Ambulatory Visit (HOSPITAL_COMMUNITY)
Admission: RE | Admit: 2014-06-15 | Discharge: 2014-06-15 | Disposition: A | Discharge status: Home / Self Care | Payer: Medicare Other | Source: Ambulatory Visit | Attending: Cardiology | Admitting: Cardiology

## 2014-06-15 ENCOUNTER — Encounter (HOSPITAL_COMMUNITY): Payer: Self-pay

## 2014-06-15 DIAGNOSIS — R0600 Dyspnea, unspecified: Secondary | ICD-10-CM | POA: Diagnosis not present

## 2014-06-15 DIAGNOSIS — I2510 Atherosclerotic heart disease of native coronary artery without angina pectoris: Secondary | ICD-10-CM | POA: Insufficient documentation

## 2014-06-15 DIAGNOSIS — R0609 Other forms of dyspnea: Secondary | ICD-10-CM | POA: Diagnosis not present

## 2014-06-15 DIAGNOSIS — R0602 Shortness of breath: Secondary | ICD-10-CM

## 2014-06-15 DIAGNOSIS — R5383 Other fatigue: Secondary | ICD-10-CM | POA: Insufficient documentation

## 2014-06-15 MED ORDER — SODIUM CHLORIDE 0.9 % IJ SOLN
10.0000 mL | INTRAMUSCULAR | Status: DC | PRN
  Administered 2014-06-15: 10 mL via INTRAVENOUS
  Filled 2014-06-15: qty 10

## 2014-06-15 MED ORDER — TECHNETIUM TC 99M SESTAMIBI - CARDIOLITE
10.0000 | Freq: Once | INTRAVENOUS | Status: AC | PRN
  Administered 2014-06-15: 10 via INTRAVENOUS

## 2014-06-15 MED ORDER — TECHNETIUM TC 99M SESTAMIBI GENERIC - CARDIOLITE
30.0000 | Freq: Once | INTRAVENOUS | Status: AC | PRN
  Administered 2014-06-15: 30 via INTRAVENOUS

## 2014-06-15 MED ORDER — REGADENOSON 0.4 MG/5ML IV SOLN
INTRAVENOUS | Status: AC
  Administered 2014-06-15: 0.4 mg via INTRAVENOUS
  Filled 2014-06-15: qty 5

## 2014-06-15 MED ORDER — REGADENOSON 0.4 MG/5ML IV SOLN
0.4000 mg | Freq: Once | INTRAVENOUS | Status: AC | PRN
  Administered 2014-06-15: 0.4 mg via INTRAVENOUS

## 2014-06-15 MED ORDER — SODIUM CHLORIDE 0.9 % IJ SOLN
INTRAMUSCULAR | Status: AC
  Administered 2014-06-15: 10 mL via INTRAVENOUS
  Filled 2014-06-15: qty 3

## 2014-06-15 NOTE — Progress Notes (Signed)
Stress Lab Nurses Notes - [name] [name]  [name] [name] 06/15/2014 Reason for doing test: CAD, Dyspnea and Fatigue Type of test: [name] Nurse performing test: Gerrit Halls, RN Nuclear Medicine Tech: Melburn Hake Echo Tech: Not Applicable MD performing test: Branch/K.Purcell Nails NP Family MD: Luan Pulling Test explained and consent signed: Yes.   IV started: Saline lock flushed, No redness or edema and Saline lock started in radiology Symptoms: SOB & nausea Treatment/Intervention: None Reason test stopped: protocol completed After recovery IV was: Discontinued via X-ray tech and No redness or edema Patient to return to Nuc. Med at : 9:30 Patient discharged: Home Patient's Condition upon discharge was: stable Comments: During test BP 113/66 & HR 74.  Recovery BP 126/64 & HR 60.  Symptoms resolved in recovery. [name] T

## 2014-06-18 ENCOUNTER — Telehealth: Payer: Self-pay | Admitting: *Deleted

## 2014-06-18 NOTE — Telephone Encounter (Signed)
Patient informed. 

## 2014-06-18 NOTE — Telephone Encounter (Signed)
-----   Message from Satira Sark, MD sent at 06/18/2014  7:45 AM EST ----- Reviewed report. Please let her know that the stress test looked good overall without obvious areas of ischemia to suggest progression in CAD or graft disease as a clear expiration for her symptoms. LVEF also normal. Overall low risk study. We can discuss further follow-up.

## 2014-06-19 ENCOUNTER — Ambulatory Visit (HOSPITAL_COMMUNITY)
Admission: RE | Admit: 2014-06-19 | Discharge: 2014-06-19 | Disposition: A | Discharge status: Home / Self Care | Payer: Medicare Other | Source: Ambulatory Visit | Attending: Pulmonary Disease | Admitting: Pulmonary Disease

## 2014-06-19 DIAGNOSIS — N63 Unspecified lump in unspecified breast: Secondary | ICD-10-CM

## 2014-06-20 ENCOUNTER — Ambulatory Visit (INDEPENDENT_AMBULATORY_CARE_PROVIDER_SITE_OTHER): Payer: Medicare Other | Admitting: *Deleted

## 2014-06-20 DIAGNOSIS — Z5181 Encounter for therapeutic drug level monitoring: Secondary | ICD-10-CM

## 2014-06-20 DIAGNOSIS — I480 Paroxysmal atrial fibrillation: Secondary | ICD-10-CM

## 2014-06-20 DIAGNOSIS — I482 Chronic atrial fibrillation, unspecified: Secondary | ICD-10-CM

## 2014-06-20 LAB — POCT INR: INR: 2.4

## 2014-06-26 ENCOUNTER — Ambulatory Visit (INDEPENDENT_AMBULATORY_CARE_PROVIDER_SITE_OTHER): Payer: Medicare Other | Admitting: Adult Health

## 2014-06-26 ENCOUNTER — Ambulatory Visit: Payer: Medicare Other | Admitting: Cardiology

## 2014-06-26 ENCOUNTER — Other Ambulatory Visit: Payer: Self-pay | Admitting: Cardiology

## 2014-06-26 ENCOUNTER — Encounter: Payer: Self-pay | Admitting: Adult Health

## 2014-06-26 VITALS — BP 124/72 | HR 82 | Ht 61.0 in | Wt 172.6 lb

## 2014-06-26 DIAGNOSIS — R0602 Shortness of breath: Secondary | ICD-10-CM

## 2014-06-26 DIAGNOSIS — I482 Chronic atrial fibrillation, unspecified: Secondary | ICD-10-CM

## 2014-06-26 DIAGNOSIS — E7800 Pure hypercholesterolemia, unspecified: Secondary | ICD-10-CM

## 2014-06-26 NOTE — Patient Instructions (Signed)
Your physician recommends that you schedule a follow-up appointment in: 3 weeks with Dr. Domenic Polite.  Your physician recommends that you continue on your current medications as directed. Please refer to the Current Medication list given to you today.  Your physician has recommended that you have a pulmonary function test. Pulmonary Function Tests are a group of tests that measure how well air moves in and out of your lungs.   Thank you for choosing [facility]!

## 2014-06-26 NOTE — Progress Notes (Signed)
Cardiology Office Note   Date:  06/26/2014   ID:  Jisele, Price 01-Aug-1931, MRN [id number]  PCP:  Alonza Bogus, MD  Cardiologist:  [place]/ Jory Sims, NP   Chief Complaint  Patient presents with  . Coronary Artery Disease  . Shortness of Breath      History of Present Illness: [name] [name] is a 81 y.o. female who presents for ongoing assessment and management of known history of CAD, status post CABG in 1998, status post drug-eluting stent to the posterior lateral system, and 2006, who was last seen by Dr. Domenic Polite on 06/12/2014, with complaints of worsening dyspnea and fatigue on exertion.  Dr. Domenic Polite, ordered a stress Myoview.  This was completed on 06/15/2014.  She was found to have no reversible ischemia or infarction, normal left ventricular wall motion, left ventricular ejection fraction 77%, overall low risk findings.   She comes today still symptomatic.  Today, her breathing status has not improved.  She is often very dyspneic with exertion.I explained the results of her stress test, and she seems unconvinced that her heart is not contributing to her symptoms.  She denies any chest discomfort.  She is doing a lot of personal at breathing here in the office.  Her oxygen saturation on triage was 97%.  Past Medical History  Diagnosis Date  . Osteoarthritis   . Mixed hyperlipidemia   . Coronary atherosclerosis of native coronary artery     Multivessel status post CABG, DES PLA March 2006  . Essential hypertension, benign   . Heart block   . Syncope   . Ventricular tachycardia     Status post ICD  . Ischemic cardiomyopathy     LVEF 55% as of March 2014  . Atrial fibrillation   . CHF (congestive heart failure)     Past Surgical History  Procedure Laterality Date  . Coronary artery bypass graft      LIMA to LAD, SVG to diagonal, SVG to ramus and OM  . Cardiac defibrillator placement      Guidant device  . Yag laser application Left 09/21/4032    Procedure:  YAG LASER APPLICATION;  Surgeon: Elta Guadeloupe T. Gershon Crane, MD;  Location: [facility];  Service: Ophthalmology;  Laterality: Left;  . Yag laser application Right 7/42/5956    Procedure: YAG LASER APPLICATION;  Surgeon: Elta Guadeloupe T. Gershon Crane, MD;  Location: [facility];  Service: Ophthalmology;  Laterality: Right;  . Implantable cardioverter defibrillator generator change N/A 09/25/2013    Procedure: IMPLANTABLE CARDIOVERTER DEFIBRILLATOR GENERATOR CHANGE;  Surgeon: Evans Lance, MD;  Location: [facility] CATH LAB;  Service: Cardiovascular;  Laterality: N/A;  . Lead revision N/A 09/29/2013    Procedure: LEAD REVISION;  Surgeon: Deboraha Sprang, MD;  Location: [facility] CATH LAB;  Service: Cardiovascular;  Laterality: N/A;     Current Outpatient Prescriptions  Medication Sig Dispense Refill  . acetaminophen (TYLENOL) 500 MG tablet Take 500 mg by mouth daily.     [name] amLODipine (NORVASC) 5 MG tablet TAKE ONE TABLET BY MOUTH ONCE DAILY. 30 tablet 6  . aspirin EC 81 MG tablet Take 81 mg by mouth every morning.     [name] atorvastatin (LIPITOR) 20 MG tablet Take 1 tablet (20 mg total) by mouth at bedtime. 90 tablet 3  . Calcium Carbonate-Vitamin D (CALCIUM 600 + D PO) Take 1 tablet by mouth 2 (two) times daily.    . Cholecalciferol (VITAMIN D) 2000 UNITS CAPS Take 1 capsule by mouth at bedtime.     [name]  folic acid (FOLVITE) 1 MG tablet Take 1 mg by mouth 2 (two) times daily.     . furosemide (LASIX) 20 MG tablet Take 1 tablet (20 mg total) by mouth 2 (two) times daily. 60 tablet 12  . losartan-hydrochlorothiazide (HYZAAR) 100-12.5 MG per tablet Take 1 tablet by mouth every morning.     . metoprolol (TOPROL-XL) 200 MG 24 hr tablet Take 200 mg by mouth every morning.     Vladimir Faster Glycol-Propyl Glycol (SYSTANE OP) Place 1 drop into both eyes daily as needed (Dry eyes).    . potassium chloride SA (K-DUR,KLOR-CON) 20 MEQ tablet Take 20 mEq by mouth 3 (three) times daily.    [name] warfarin (COUMADIN) 2.5 MG tablet Take 2.5-3.75 mg by mouth daily. Patient  takes 2.5mg  daily except On Tues.,Thurs.,Sat.,patient takes 3.75 LISA MANAGES     No current facility-administered medications for this visit.    Allergies:   Codeine and Keflex    Social History:  The patient  reports that she has never smoked. She has never used smokeless tobacco. She reports that she does not drink alcohol or use illicit drugs.   Family History:  The patient's family history includes Heart attack in her brother and father.    ROS: .   All other systems are reviewed and negative.Unless otherwise mentioned in  H&P above.   PHYSICAL EXAM: VS:  There were no vitals taken for this visit. , BMI There is no weight on file to calculate BMI. GEN: Well nourished, well developed, in no acute distress HEENT: normal Neck: no JVD, carotid bruits, or masses Cardiac:RRR; soft 1/6 systolic murmur, , rubs, or gallops,no edema  Respiratory:  Clear to auscultation bilaterally, Appears dyspneic GI: soft, nontender, nondistended, + BS MS: no deformity or atrophy Skin: warm and dry, no rash Neuro:  Strength and sensation are intact Psych: euthymic mood, full affect   Recent Labs: 07/19/2013: Magnesium 2.0; TSH 3.260 07/22/2013: Pro B Natriuretic peptide (BNP) 1118.0* 06/09/2014: ALT 18; B Natriuretic Peptide 348.0*; BUN 36*; Creatinine 1.40*; Hemoglobin 11.3*; Platelets 242; Potassium 4.4; Sodium 143    Lipid Panel    Component Value Date/Time   CHOL 111 01/24/2013 0946   TRIG 119 01/24/2013 0946   HDL 31* 01/24/2013 0946   CHOLHDL 3.6 01/24/2013 0946   VLDL 24 01/24/2013 0946   LDLCALC 56 01/24/2013 0946      Wt Readings from Last 3 Encounters:  06/12/14 169 lb 12.8 oz (77.021 kg)  06/09/14 169 lb (76.658 kg)  04/04/14 168 lb (76.204 kg)      Other studies Reviewed: Additional studies/ records that were reviewed today include: chest x-ray, echocardiogram, nuclear medicine study. Review of the above records demonstrates: chest x-ray demonstrated atelectasis,  echocardiogram demonstrated mild aortic valve stenosis, nuclear medicine study was low risk.   ASSESSMENT AND PLAN:  1.  Atrial fibrillation: Heart rate is currently well-controlled.She is not having symptoms of palpitations, racing heart rate,or dizziness.  She will continue on metoprolol 20 mg every morning.  She will continue on anticoagulation therapy with Coumadin.  She is followed in our [place] office.  Coumadin clinic.  2.Dyspnea on Exertion:the patient still states she is having shortness of breath with minimal exertion.  I have done a walking oxygen saturation here in the office.  Her O2 sat ranged from 94% to 97%, although she was short of breath at the end of walking.  I reviewed her most recent CBC and she is not found to be anemic, hemoglobin  was 11.3, hematocrit was 35.5.  I discussed her echocardiogram, given her a copy of her chest x-ray, and nuclear medicine study.  She still seems unconvinced.  My plan is to schedule her for PFTs to evaluate for lung function.  If these come back and are found to be normal.  The next step may be to proceed with cardiac catheterization.  She will discuss this further with Dr. Domenic Polite, who she wishes to see, specifically.   Current medicines are reviewed at length with the patient today.    Labs/ tests ordered today include: PFTs No orders of the defined types were placed in this encounter.     Disposition:   FU with Dr. Domenic Polite in 3-4 weeks.  Signed, Jory Sims, NP  06/26/2014 1:07 PM    [facility] [address] [place], [place] [address] Phone: [phone]; Fax: [phone]

## 2014-06-26 NOTE — Progress Notes (Deleted)
Name: [name] [name]    DOB: 1931/10/09  Age: 81 y.o.  MR#: [id number]       PCP:  Alonza Bogus, MD      Insurance: Payor: [employer] MEDICARE / Plan: [facility] MEDICARE / Product Type: *No Product type* /   CC:    Chief Complaint  Patient presents with  . Coronary Artery Disease  . Shortness of Breath    VS Filed Vitals:   06/26/14 1306  BP: 124/72  Pulse: 82  Height: 5\' 1"  (1.549 m)  Weight: 172 lb 9.6 oz (78.291 kg)  SpO2: 97%    Weights Current Weight  06/26/14 172 lb 9.6 oz (78.291 kg)  06/12/14 169 lb 12.8 oz (77.021 kg)  06/09/14 169 lb (76.658 kg)    Blood Pressure  BP Readings from Last 3 Encounters:  06/26/14 124/72  06/12/14 135/77  06/09/14 111/84     Admit date:  (Not on file) Last encounter with RMR:  Visit date not found   Allergy Codeine and Keflex  Current Outpatient Prescriptions  Medication Sig Dispense Refill  . acetaminophen (TYLENOL) 500 MG tablet Take 500 mg by mouth daily.     [name] amLODipine (NORVASC) 5 MG tablet TAKE ONE TABLET BY MOUTH ONCE DAILY. 30 tablet 6  . aspirin EC 81 MG tablet Take 81 mg by mouth every morning.     [name] atorvastatin (LIPITOR) 20 MG tablet Take 1 tablet (20 mg total) by mouth at bedtime. 90 tablet 3  . Calcium Carbonate-Vitamin D (CALCIUM 600 + D PO) Take 1 tablet by mouth 2 (two) times daily.    . Cholecalciferol (VITAMIN D) 2000 UNITS CAPS Take 1 capsule by mouth at bedtime.     . folic acid (FOLVITE) 1 MG tablet Take 1 mg by mouth 2 (two) times daily.     . furosemide (LASIX) 20 MG tablet Take 1 tablet (20 mg total) by mouth 2 (two) times daily. 60 tablet 12  . losartan-hydrochlorothiazide (HYZAAR) 100-12.5 MG per tablet Take 1 tablet by mouth every morning.     . metoprolol (TOPROL-XL) 200 MG 24 hr tablet Take 200 mg by mouth every morning.     Vladimir Faster Glycol-Propyl Glycol (SYSTANE OP) Place 1 drop into both eyes daily as needed (Dry eyes).    . potassium chloride SA (K-DUR,KLOR-CON) 20 MEQ tablet Take 20 mEq  by mouth 3 (three) times daily.    [name] warfarin (COUMADIN) 2.5 MG tablet Take 2.5-3.75 mg by mouth daily. Patient takes 2.5mg  daily except On Tues.,Thurs.,Sat.,patient takes 3.75 LISA MANAGES     No current facility-administered medications for this visit.    Discontinued Meds:   There are no discontinued medications.  Patient Active Problem List   Diagnosis Date Noted  . Pacemaker complications 75/01/2584  . CAP (community acquired pneumonia) 07/19/2013  . Shortness of breath 07/19/2013  . Encounter for therapeutic drug monitoring 06/12/2013  . Chronic diastolic heart failure 27/78/2423  . Aortic stenosis 10/18/2012  . Encounter for long-term (current) use of anticoagulants 10/08/2011  . Atrial fibrillation 09/21/2011  . Mixed hyperlipidemia 08/25/2008  . Essential hypertension, benign 08/25/2008  . Coronary atherosclerosis of native coronary artery 08/25/2008  . VENTRICULAR TACHYCARDIA 08/25/2008  . IMPLANTATION OF DEFIBRILLATOR, HX OF 08/25/2008    LABS    Component Value Date/Time   NA 143 06/09/2014 1328   NA 141 04/03/2014 1051   NA 145 09/29/2013 0745   K 4.4 06/09/2014 1328   K 4.5 04/03/2014 1051  K 4.2 09/29/2013 0745   CL 108 06/09/2014 1328   CL 104 04/03/2014 1051   CL 107 09/29/2013 0745   CO2 27 06/09/2014 1328   CO2 30 04/03/2014 1051   CO2 22 09/29/2013 0745   GLUCOSE 85 06/09/2014 1328   GLUCOSE 106* 04/03/2014 1051   GLUCOSE 111* 09/29/2013 0745   BUN 36* 06/09/2014 1328   BUN 29* 04/03/2014 1051   BUN 36* 09/29/2013 0745   CREATININE 1.40* 06/09/2014 1328   CREATININE 1.19* 04/03/2014 1051   CREATININE 1.02 09/29/2013 0745   CREATININE 1.16* 09/28/2013 0435   CREATININE 1.06 09/22/2013 1004   CREATININE 1.17* 09/06/2013 0952   CALCIUM 9.8 06/09/2014 1328   CALCIUM 9.4 04/03/2014 1051   CALCIUM 9.5 09/29/2013 0745   GFRNONAA 34* 06/09/2014 1328   GFRNONAA 51* 09/29/2013 0745   GFRNONAA 44* 09/28/2013 0435   GFRAA 40* 06/09/2014 1328    GFRAA 59* 09/29/2013 0745   GFRAA 51* 09/28/2013 0435   CMP     Component Value Date/Time   NA 143 06/09/2014 1328   K 4.4 06/09/2014 1328   CL 108 06/09/2014 1328   CO2 27 06/09/2014 1328   GLUCOSE 85 06/09/2014 1328   BUN 36* 06/09/2014 1328   CREATININE 1.40* 06/09/2014 1328   CREATININE 1.19* 04/03/2014 1051   CALCIUM 9.8 06/09/2014 1328   PROT 6.9 06/09/2014 1328   ALBUMIN 4.3 06/09/2014 1328   AST 18 06/09/2014 1328   ALT 18 06/09/2014 1328   ALKPHOS 51 06/09/2014 1328   BILITOT 1.6* 06/09/2014 1328   GFRNONAA 34* 06/09/2014 1328   GFRAA 40* 06/09/2014 1328       Component Value Date/Time   WBC 9.1 06/09/2014 1328   WBC 7.8 09/29/2013 0745   WBC 8.9 09/28/2013 0435   HGB 11.3* 06/09/2014 1328   HGB 12.2 09/29/2013 0745   HGB 11.3* 09/28/2013 0435   HCT 35.5* 06/09/2014 1328   HCT 36.4 09/29/2013 0745   HCT 34.2* 09/28/2013 0435   MCV 93.4 06/09/2014 1328   MCV 92.9 09/29/2013 0745   MCV 91.9 09/28/2013 0435    Lipid Panel     Component Value Date/Time   CHOL 111 01/24/2013 0946   TRIG 119 01/24/2013 0946   HDL 31* 01/24/2013 0946   CHOLHDL 3.6 01/24/2013 0946   VLDL 24 01/24/2013 0946   LDLCALC 56 01/24/2013 0946    ABG No results found for: PHART, PCO2ART, PO2ART, HCO3, TCO2, ACIDBASEDEF, O2SAT   Lab Results  Component Value Date   TSH 3.260 07/19/2013   BNP (last 3 results)  Recent Labs  06/09/14 1328  BNP 348.0*    ProBNP (last 3 results)  Recent Labs  07/19/13 0724 07/22/13 0505  PROBNP 2488.0* 1118.0*    Cardiac Panel (last 3 results) No results for input(s): CKTOTAL, CKMB, TROPONINI, RELINDX in the last 72 hours.  Iron/TIBC/Ferritin/ %Sat No results found for: IRON, TIBC, FERRITIN, IRONPCTSAT   EKG Orders placed or performed during the hospital encounter of 06/09/14  . ED EKG  . ED EKG  . EKG 12-Lead  . EKG 12-Lead  . EKG     Prior Assessment and Plan Problem List as of 06/26/2014      Cardiovascular and Mediastinum    Essential hypertension, benign   Last Assessment & Plan 04/04/2014 Office Visit Written 04/04/2014 11:05 AM by Satira Sark, MD    Blood pressure control is reasonable today.      Coronary atherosclerosis of native coronary artery  Last Assessment & Plan 04/04/2014 Office Visit Written 04/04/2014 11:05 AM by Satira Sark, MD    No active angina symptoms status post CABG in 2006.      VENTRICULAR TACHYCARDIA   Last Assessment & Plan 09/08/2013 Office Visit Written 09/08/2013 12:17 PM by Evans Lance, MD    She has had no recurrent sustained ventricular arrhythmias or syncope or ICD shock. No change in her medications.      Atrial fibrillation   Last Assessment & Plan 04/04/2014 Office Visit Written 04/04/2014 11:05 AM by Satira Sark, MD    Continues on Coumadin.      Aortic stenosis   Last Assessment & Plan 06/12/2013 Office Visit Written 06/12/2013  2:52 PM by Satira Sark, MD    Mild.      Chronic diastolic heart failure   Last Assessment & Plan 04/04/2014 Office Visit Written 04/04/2014 11:04 AM by Satira Sark, MD    Weight is stable, no progressive leg edema on current diuretic regimen. Renal function also stable. No change to current treatment, did recommend going back to her walking regimen. Follow-up in 3 months.        Respiratory   CAP (community acquired pneumonia)     Other   Mixed hyperlipidemia   Last Assessment & Plan 03/01/2013 Office Visit Edited 03/01/2013 10:40 AM by Satira Sark, MD    Recent LDL 56, mild increase in ALT. We went ahead and reduced Lipitor dose as I suspect she will still have optimal control over time and less risk for side effects. Recheck FLP and LFT for next visit.      IMPLANTATION OF DEFIBRILLATOR, HX OF   Last Assessment & Plan 01/04/2014 Office Visit Written 01/05/2014  2:58 PM by Evans Lance, MD    Her medtronic ICD is working normally. Will recheck in several months.      Encounter for  long-term (current) use of anticoagulants   Last Assessment & Plan 10/20/2011 Office Visit Written 10/20/2011 12:56 PM by Hillary Bow, MD    Her continue her for followup.      Encounter for therapeutic drug monitoring   Shortness of breath   Pacemaker complications       Imaging: Dg Chest 2 View  06/09/2014   CLINICAL DATA:  Shortness of breath since last week, initial evaluation  EXAM: CHEST  2 VIEW  COMPARISON:  05/30/2014  FINDINGS: Mild to moderate cardiac enlargement is stable. Status post previous CABG. Cardiac pacer in unchanged position. Vascular pattern normal. Minimal blunting left costophrenic angle stable. Minimal blunting right costophrenic angle new from prior study.  IMPRESSION: Extremely tiny right pleural effusion new from prior study. Stable cardiac enlargement without evidence of pulmonary edema.   Electronically Signed   By: Skipper Cliche M.D.   On: 06/09/2014 15:10   Dg Chest 2 View  05/30/2014   CLINICAL DATA:  Acute shortness of breath for 2 weeks.  EXAM: CHEST  2 VIEW  COMPARISON:  09/28/2013  FINDINGS: Left subclavian AICD/ pacer noted. Prior coronal bypass changes. Mild cardiomegaly and hyperinflation. Suspect COPD/emphysema. No superimposed pneumonia, collapse or consolidation. No current edema, significant effusion, or pneumothorax. Minor left base atelectasis suspected. Atherosclerosis evident of the aorta.  IMPRESSION: Cardiomegaly without CHF.  Left base atelectasis  Stable mild hyperinflation   Electronically Signed   By: Jerilynn Mages.  Shick M.D.   On: 05/30/2014 16:45   Nm Myocar Multi W/spect W/wall Motion / Ef  06/15/2014  CLINICAL DATA:  81 year old female with a known history of coronary artery disease referred for dyspnea on exertion.  EXAM: MYOCARDIAL IMAGING WITH SPECT (REST AND PHARMACOLOGIC-STRESS)  GATED LEFT VENTRICULAR WALL MOTION STUDY  LEFT VENTRICULAR EJECTION FRACTION  TECHNIQUE: Standard myocardial SPECT imaging was performed after resting intravenous  injection of 10 mCi Tc-74m sestamibi. Subsequently, intravenous infusion of Lexiscan was performed under the supervision of the Cardiology staff. At peak effect of the drug, 30 mCi Tc-60m sestamibi was injected intravenously and standard myocardial SPECT imaging was performed. Quantitative gated imaging was also performed to evaluate left ventricular wall motion, and estimate left ventricular ejection fraction.  COMPARISON:  None.  FINDINGS: Pharmacological stress  Baseline EKG shows ventricular pacing. After injection heart rate increased from 61 beats per min up to 75 beats per min, and blood pressure decreased from 139/76 and 113/66. The test was stopped after injection was complete, the patient did not experience any chest pain. Post-injection EKG showed no significant arrhythmias, findings not interpretable for ischemia because she was ventricular paced throughout the study.  Perfusion: No decreased activity in the left ventricle on stress imaging to suggest reversible ischemia or infarction.  Wall Motion: Normal left ventricular wall motion. No left ventricular dilation.  Left Ventricular Ejection Fraction: 77 %  End diastolic volume 55 ml  End systolic volume 12 ml  IMPRESSION: 1. No reversible ischemia or infarction.  2. Normal left ventricular wall motion.  3. Left ventricular ejection fraction 77%  4. Low-risk stress test findings*.  *2012 Appropriate Use Criteria for Coronary Revascularization Focused Update: J Am Coll Cardiol. 0940;76(8):088-110. http://content.airportbarriers.com.aspx?articleid=[email or web address]   Electronically Signed   By: Carlyle Dolly   On: 06/15/2014 14:19   US Breast Ltd Uni Right Inc Axilla  06/19/2014   CLINICAL DATA:  Six month re-evaluation of probably benign finding within the right breast.  EXAM: ULTRASOUND OF THE right BREAST  COMPARISON:  02/13/2014.  FINDINGS: On physical exam, there is no discrete palpable abnormality within the lateral right breast.  Targeted ultrasound  is performed, showing resolution of the previously seen area of echogenicity with central subtle lucency consistent with resolving fat necrosis. There are no additional findings.  IMPRESSION: Resolution of the previously seen area of fat necrosis located within the lateral right breast at 9 o'clock position 4 cm from the nipple. Recommend bilateral screening mammography in September, 2016.  RECOMMENDATION: Bilateral screening mammography in September, 2016.  I have discussed the findings and recommendations with the patient. Results were also provided in writing at the conclusion of the visit. If applicable, a reminder letter will be sent to the patient regarding the next appointment.  BI-RADS CATEGORY  1: Negative.   Electronically Signed   By: Altamese Cabal M.D.   On: 06/19/2014 11:29

## 2014-07-04 ENCOUNTER — Ambulatory Visit (HOSPITAL_COMMUNITY)
Admission: RE | Admit: 2014-07-04 | Discharge: 2014-07-04 | Disposition: A | Discharge status: Home / Self Care | Payer: Medicare Other | Source: Ambulatory Visit | Attending: Adult Health | Admitting: Adult Health

## 2014-07-04 DIAGNOSIS — R0602 Shortness of breath: Secondary | ICD-10-CM | POA: Diagnosis not present

## 2014-07-04 LAB — PULMONARY FUNCTION TEST
DL/VA % PRED: 91 %
DL/VA: 4.03 ml/min/mmHg/L
DLCO COR: 8.96 ml/min/mmHg
DLCO cor % pred: 44 %
DLCO unc % pred: 44 %
DLCO unc: 8.96 ml/min/mmHg
FEF 25-75 PRE: 0.45 L/s
FEF 25-75 Post: 0.89 L/sec
FEF2575-%Change-Post: 98 %
FEF2575-%PRED-POST: 73 %
FEF2575-%Pred-Pre: 37 %
FEV1-%Change-Post: 26 %
FEV1-%PRED-PRE: 49 %
FEV1-%Pred-Post: 62 %
FEV1-PRE: 0.81 L
FEV1-Post: 1.03 L
FEV1FVC-%Change-Post: 6 %
FEV1FVC-%PRED-PRE: 87 %
FEV6-%CHANGE-POST: 20 %
FEV6-%PRED-POST: 70 %
FEV6-%Pred-Pre: 58 %
FEV6-POST: 1.48 L
FEV6-Pre: 1.23 L
FEV6FVC-%Pred-Post: 106 %
FEV6FVC-%Pred-Pre: 106 %
FVC-%CHANGE-POST: 18 %
FVC-%PRED-POST: 66 %
FVC-%PRED-PRE: 55 %
FVC-POST: 1.48 L
FVC-Pre: 1.25 L
POST FEV1/FVC RATIO: 69 %
PRE FEV1/FVC RATIO: 65 %
Post FEV6/FVC ratio: 100 %
Pre FEV6/FVC Ratio: 100 %
RV % PRED: 119 %
RV: 2.68 L
TLC % PRED: 84 %
TLC: 3.89 L

## 2014-07-04 MED ORDER — ALBUTEROL SULFATE (2.5 MG/3ML) 0.083% IN NEBU
2.5000 mg | INHALATION_SOLUTION | Freq: Once | RESPIRATORY_TRACT | Status: AC
  Administered 2014-07-04: 2.5 mg via RESPIRATORY_TRACT

## 2014-07-05 ENCOUNTER — Telehealth: Payer: Self-pay | Admitting: Cardiology

## 2014-07-05 DIAGNOSIS — J439 Emphysema, unspecified: Secondary | ICD-10-CM

## 2014-07-05 MED ORDER — ALBUTEROL SULFATE HFA 108 (90 BASE) MCG/ACT IN AERS: 2.0000 | INHALATION_SPRAY | Freq: Four times a day (QID) | RESPIRATORY_TRACT | Status: DC | PRN

## 2014-07-05 NOTE — Telephone Encounter (Signed)
-----   Message from Lendon Colonel, NP sent at 07/05/2014 12:23 PM EDT ----- Severe COPD found on PFT's. Please refer to pulmonary either [facility] or Dr. Luan Pulling for further treatment. Give Rx of Provenitl inhaler 2 puffs PRN for dyspnea.

## 2014-07-05 NOTE — Telephone Encounter (Signed)
Returning call from [facility] for test results / tg

## 2014-07-05 NOTE — Telephone Encounter (Signed)
Ref placed to pulmonary ,escribed rx,lm for pt to call back

## 2014-07-09 ENCOUNTER — Telehealth: Payer: Self-pay | Admitting: Cardiology

## 2014-07-09 ENCOUNTER — Ambulatory Visit (INDEPENDENT_AMBULATORY_CARE_PROVIDER_SITE_OTHER): Payer: Medicare Other | Admitting: *Deleted

## 2014-07-09 DIAGNOSIS — I472 Ventricular tachycardia: Secondary | ICD-10-CM

## 2014-07-09 DIAGNOSIS — I4729 Other ventricular tachycardia: Secondary | ICD-10-CM

## 2014-07-09 NOTE — Progress Notes (Signed)
Remote ICD transmission.   

## 2014-07-09 NOTE — Telephone Encounter (Signed)
LMOVM reminding pt to send remote transmission.   

## 2014-07-10 ENCOUNTER — Telehealth: Payer: Self-pay | Admitting: *Deleted

## 2014-07-10 ENCOUNTER — Ambulatory Visit: Payer: Medicare Other | Admitting: Cardiology

## 2014-07-10 LAB — MDC_IDC_ENUM_SESS_TYPE_REMOTE
Battery Remaining Longevity: 88 mo
Battery Voltage: 3 V
Brady Statistic AP VP Percent: 39.27 %
Brady Statistic AP VS Percent: 0.02 %
Brady Statistic AS VP Percent: 60.28 %
Brady Statistic RA Percent Paced: 39.29 %
Brady Statistic RV Percent Paced: 99.55 %
Date Time Interrogation Session: 20160321181841
HIGH POWER IMPEDANCE MEASURED VALUE: 69 Ohm
HighPow Impedance: 52 Ohm
Lead Channel Impedance Value: 342 Ohm
Lead Channel Impedance Value: 418 Ohm
Lead Channel Pacing Threshold Amplitude: 0.75 V
Lead Channel Pacing Threshold Amplitude: 0.875 V
Lead Channel Pacing Threshold Pulse Width: 0.4 ms
Lead Channel Sensing Intrinsic Amplitude: 0.375 mV
Lead Channel Sensing Intrinsic Amplitude: 11.875 mV
Lead Channel Sensing Intrinsic Amplitude: 11.875 mV
Lead Channel Setting Pacing Pulse Width: 0.4 ms
MDC IDC MSMT LEADCHNL RA SENSING INTR AMPL: 0.375 mV
MDC IDC MSMT LEADCHNL RV IMPEDANCE VALUE: 361 Ohm
MDC IDC MSMT LEADCHNL RV PACING THRESHOLD PULSEWIDTH: 0.4 ms
MDC IDC SET LEADCHNL RA PACING AMPLITUDE: 2 V
MDC IDC SET LEADCHNL RV PACING AMPLITUDE: 2.5 V
MDC IDC SET LEADCHNL RV SENSING SENSITIVITY: 0.3 mV
MDC IDC SET ZONE DETECTION INTERVAL: 350 ms
MDC IDC STAT BRADY AS VS PERCENT: 0.43 %
Zone Setting Detection Interval: 300 ms
Zone Setting Detection Interval: 350 ms
Zone Setting Detection Interval: 450 ms

## 2014-07-10 MED ORDER — FUROSEMIDE 20 MG PO TABS
ORAL_TABLET | ORAL | Status: DC
Start: 2014-07-10 — End: 2014-08-01

## 2014-07-10 NOTE — Telephone Encounter (Signed)
Spoke to patient about abnormal Optivol reading since beginning of February (persistent AF also began on 05/19/14). Patient states that she has noticed worsening shortness of breath and mild LE edema since beginning of February. She denies noticeable wt gain and excessive sodium intake. Patient states that she's had a series of tests performed, including an ECHO, NUC, and PFT. Her dx per PFT was emphysema---RX'd inhaler.  Will inform Otto Herb, RN of findings.

## 2014-07-10 NOTE — Addendum Note (Signed)
Addended by: Janan Halter F on: 07/10/2014 02:57 PM   Modules accepted: Orders

## 2014-07-10 NOTE — Telephone Encounter (Addendum)
Discussed with Dr Lovena Le and he recommends she increase her Furosemide to 40mg  in the morning and 20 mg at lunch time.  Then will need a BMP in 2 weeks 07/26/14 at Dr McDowell's office visit

## 2014-07-18 ENCOUNTER — Ambulatory Visit (INDEPENDENT_AMBULATORY_CARE_PROVIDER_SITE_OTHER): Payer: Medicare Other | Admitting: *Deleted

## 2014-07-18 ENCOUNTER — Other Ambulatory Visit (HOSPITAL_COMMUNITY): Payer: Self-pay | Admitting: Pulmonary Disease

## 2014-07-18 DIAGNOSIS — I482 Chronic atrial fibrillation, unspecified: Secondary | ICD-10-CM

## 2014-07-18 DIAGNOSIS — Z5181 Encounter for therapeutic drug level monitoring: Secondary | ICD-10-CM | POA: Diagnosis not present

## 2014-07-18 DIAGNOSIS — I480 Paroxysmal atrial fibrillation: Secondary | ICD-10-CM | POA: Diagnosis not present

## 2014-07-18 DIAGNOSIS — R0602 Shortness of breath: Secondary | ICD-10-CM

## 2014-07-18 LAB — POCT INR: INR: 3

## 2014-07-19 ENCOUNTER — Encounter: Payer: Self-pay | Admitting: Internal Medicine

## 2014-07-24 ENCOUNTER — Encounter (HOSPITAL_COMMUNITY): Payer: Self-pay

## 2014-07-24 ENCOUNTER — Ambulatory Visit (HOSPITAL_COMMUNITY)
Admission: RE | Admit: 2014-07-24 | Discharge: 2014-07-24 | Disposition: A | Discharge status: Home / Self Care | Payer: Medicare Other | Source: Ambulatory Visit | Attending: Pulmonary Disease | Admitting: Pulmonary Disease

## 2014-07-24 DIAGNOSIS — R0602 Shortness of breath: Secondary | ICD-10-CM

## 2014-07-24 DIAGNOSIS — J90 Pleural effusion, not elsewhere classified: Secondary | ICD-10-CM | POA: Insufficient documentation

## 2014-07-24 DIAGNOSIS — R911 Solitary pulmonary nodule: Secondary | ICD-10-CM | POA: Diagnosis not present

## 2014-07-24 MED ORDER — IOHEXOL 300 MG/ML  SOLN
64.0000 mL | Freq: Once | INTRAMUSCULAR | Status: AC | PRN
  Administered 2014-07-24: 64 mL via INTRAVENOUS

## 2014-07-26 ENCOUNTER — Encounter: Payer: Self-pay | Admitting: Cardiology

## 2014-07-26 ENCOUNTER — Ambulatory Visit (INDEPENDENT_AMBULATORY_CARE_PROVIDER_SITE_OTHER): Payer: Medicare Other | Admitting: Cardiology

## 2014-07-26 VITALS — BP 126/68 | HR 80 | Ht 61.0 in | Wt 172.0 lb

## 2014-07-26 DIAGNOSIS — I4819 Other persistent atrial fibrillation: Secondary | ICD-10-CM

## 2014-07-26 DIAGNOSIS — R0602 Shortness of breath: Secondary | ICD-10-CM

## 2014-07-26 DIAGNOSIS — I5033 Acute on chronic diastolic (congestive) heart failure: Secondary | ICD-10-CM | POA: Diagnosis not present

## 2014-07-26 DIAGNOSIS — I481 Persistent atrial fibrillation: Secondary | ICD-10-CM | POA: Diagnosis not present

## 2014-07-26 DIAGNOSIS — I2510 Atherosclerotic heart disease of native coronary artery without angina pectoris: Secondary | ICD-10-CM

## 2014-07-26 DIAGNOSIS — Z95810 Presence of automatic (implantable) cardiac defibrillator: Secondary | ICD-10-CM

## 2014-07-26 LAB — BASIC METABOLIC PANEL
BUN: 33 mg/dL — ABNORMAL HIGH (ref 6–23)
CO2: 25 mEq/L (ref 19–32)
CREATININE: 1.26 mg/dL — AB (ref 0.50–1.10)
Calcium: 9.5 mg/dL (ref 8.4–10.5)
Chloride: 104 mEq/L (ref 96–112)
Glucose, Bld: 91 mg/dL (ref 70–99)
POTASSIUM: 4.4 meq/L (ref 3.5–5.3)
Sodium: 144 mEq/L (ref 135–145)

## 2014-07-26 NOTE — Patient Instructions (Addendum)
Your physician recommends that you schedule a follow-up appointment in: Dr Lovena Le on Wed 4/13 at 45 noon   Your physician recommends that you continue on your current medications as directed. Please refer to the Current Medication list given to you today.   Please get lab work [employer]

## 2014-07-26 NOTE — Progress Notes (Signed)
Cardiology Office Note  Date: 07/26/2014   ID: [name] [name], DOB [birth year], MRN [id number]  PCP: Alonza Bogus, MD  Primary Cardiologist: Rozann Lesches, MD   Chief Complaint  Patient presents with  . Shortness of Breath    History of Present Illness: [name] [name] is an 81 y.o. female that I last saw back in February, interval visit with [name] noted in March. She has complained of persistent shortness of breath, no documented hypoxia with ambulation, and with overall reassuring cardiac noninvasive workup so far as noted below. She has had persistent although rate-controlled atrial fibrillation since January, some abnormal Optivol readings as well. Lasix was increased by Dr. Lovena Le within the last few weeks.  She has also had pulmonary workup with PFTs and 6 minute walk test. Dr. Kathaleen Grinder report indicates a moderate ventilatory defect with significant bronchodilator improvement, severely reduced DLCO. I see that she also had a chest CT arranged by Dr. Luan Pulling as outlined below, showing small bilateral pleural effusions, solitary groundglass attenuation with right middle lobe pulmonary nodule, hyperinflation suggesting emphysema changes, and coronary atherosclerosis with evidence of previous CABG.  She comes in today for a follow-up visit. We had an extensive discussion about her recent testing. She has not noticed any major improvement with use of MDI or after the increase in Lasix. Her weight is relatively stable since March. I think it is unlikely that she actually has true emphysema since she has no history of tobacco use, although does seem to have some ventilatory limitation by her PFTs. She has further follow-up planned with Dr. Luan Pulling. I actually wonder whether some of her symptoms could be explained by persistent atrial fibrillation resulting in a worsening of her diastolic function. This would fit more temporally with her onset of symptoms. Today we discussed the  possibility of pursuing elective cardioversion to see if getting her back in sinus rhythm would lead to symptom improvement. She was somewhat hesitant to consider this today, but agreed to consider the matter and discuss it further with Dr. Lovena Le.   Past Medical History  Diagnosis Date  . Osteoarthritis   . Mixed hyperlipidemia   . Coronary atherosclerosis of native coronary artery     Multivessel status post CABG, DES PLA March 2006  . Essential hypertension, benign   . Heart block   . Syncope   . Ventricular tachycardia     Status post ICD  . Ischemic cardiomyopathy     LVEF 55% as of March 2014  . Atrial fibrillation     Past Surgical History  Procedure Laterality Date  . Coronary artery bypass graft      LIMA to LAD, SVG to diagonal, SVG to ramus and OM  . Cardiac defibrillator placement      Guidant device  . Yag laser application Left 0/07/8887    Procedure: YAG LASER APPLICATION;  Surgeon: Elta Guadeloupe T. Gershon Crane, MD;  Location: [facility];  Service: Ophthalmology;  Laterality: Left;  . Yag laser application Right 1/69/4503    Procedure: YAG LASER APPLICATION;  Surgeon: Elta Guadeloupe T. Gershon Crane, MD;  Location: [facility];  Service: Ophthalmology;  Laterality: Right;  . Implantable cardioverter defibrillator generator change N/A 09/25/2013    Procedure: IMPLANTABLE CARDIOVERTER DEFIBRILLATOR GENERATOR CHANGE;  Surgeon: Evans Lance, MD;  Location: [facility] CATH LAB;  Service: Cardiovascular;  Laterality: N/A;  . Lead revision N/A 09/29/2013    Procedure: LEAD REVISION;  Surgeon: Deboraha Sprang, MD;  Location: [facility] CATH LAB;  Service:  Cardiovascular;  Laterality: N/A;    Current Outpatient Prescriptions  Medication Sig Dispense Refill  . acetaminophen (TYLENOL) 500 MG tablet Take 500 mg by mouth daily.     [name] albuterol (PROVENTIL HFA;VENTOLIN HFA) 108 (90 BASE) MCG/ACT inhaler Inhale 2 puffs into the lungs every 6 (six) hours as needed for wheezing or shortness of breath. 1 Inhaler 2  . amLODipine (NORVASC)  5 MG tablet TAKE ONE TABLET BY MOUTH ONCE DAILY. 30 tablet 6  . aspirin EC 81 MG tablet Take 81 mg by mouth every morning.     [name] atorvastatin (LIPITOR) 20 MG tablet Take 1 tablet (20 mg total) by mouth at bedtime. 90 tablet 3  . Calcium Carbonate-Vitamin D (CALCIUM 600 + D PO) Take 1 tablet by mouth 2 (two) times daily.    . Cholecalciferol (VITAMIN D) 2000 UNITS CAPS Take 1 capsule by mouth at bedtime.     . folic acid (FOLVITE) 1 MG tablet Take 1 mg by mouth 2 (two) times daily.     . furosemide (LASIX) 20 MG tablet Take 2 tablets by mouth in the morning and 1 by mouth at lunch 270 tablet 3  . losartan-hydrochlorothiazide (HYZAAR) 100-12.5 MG per tablet Take 1 tablet by mouth every morning.     . metoprolol (TOPROL-XL) 200 MG 24 hr tablet Take 200 mg by mouth every morning.     Vladimir Faster Glycol-Propyl Glycol (SYSTANE OP) Place 1 drop into both eyes daily as needed (Dry eyes).    . potassium chloride SA (K-DUR,KLOR-CON) 20 MEQ tablet Take 20 mEq by mouth 3 (three) times daily.    [name] warfarin (COUMADIN) 2.5 MG tablet Take 1 tablet daily except 1 1/2 tablets on Tuesdays, Thursdays and Saturdays 48 tablet 6   No current facility-administered medications for this visit.    Allergies:  Codeine and Keflex   Social History: The patient  reports that she has never smoked. She has never used smokeless tobacco. She reports that she does not drink alcohol or use illicit drugs.   ROS:  Please see the history of present illness. Otherwise, complete review of systems is positive for none.  All other systems are reviewed and negative.   Physical Exam: VS:  BP 126/68 mmHg  Pulse 80  Ht 5\' 1"  (1.549 m)  Wt 172 lb (78.019 kg)  BMI 32.52 kg/m2  SpO2 97%, BMI Body mass index is 32.52 kg/(m^2).  Wt Readings from Last 3 Encounters:  07/26/14 172 lb (78.019 kg)  06/26/14 172 lb 9.6 oz (78.291 kg)  06/12/14 169 lb 12.8 oz (77.021 kg)     Appears comfortable at rest.  HEENT: Conjunctiva and lids  normal, oropharynx clear. Neck: Supple, no elevated JVP or carotid bruits, no thyromegaly.  Lungs: Clear to auscultation, nonlabored breathing at rest.  Cardiac: Regular rate and rhythm, no S3, soft systolic murmur, no pericardial rub.  Thorax: Well-healed device pocket site. Abdomen: Soft, nontender, bowel sounds present.  Extremities: Trace ankle edema, distal pulses 2+.  Skin: Warm and dry.  Musculoskeletal: No kyphosis.  Neuropsychiatric: Alert and oriented x3, affect grossly appropriate.   ECG: ECG is not ordered today.  Recent Labwork: 06/09/2014: ALT 18; AST 18; B Natriuretic Peptide 348.0*; BUN 36*; Creatinine 1.40*; Hemoglobin 11.3*; Platelets 242; Potassium 4.4; Sodium 143   Other Studies Reviewed Today:  1. Echocardiogram 09/13/2013: Study Conclusions  - Left ventricle: The cavity size was normal. Wall thickness was increased in a pattern of mild LVH. Systolic function was normal. The  estimated ejection fraction was in the range of 60% to 65%. Wall motion was normal; there were no regional wall motion abnormalities. Features are consistent with a pseudonormal left ventricular filling pattern, with concomitant abnormal relaxation and increased filling pressure (grade 2 diastolic dysfunction). Doppler parameters are consistent with elevated ventricular end-diastolic filling pressure. - Aortic valve: Mildly calcified annulus. Trileaflet; mildly calcified leaflets. There was very mild stenosis. There was no significant regurgitation. Mean gradient (S): 9 mm Hg. Valve area (VTI): 1.52 cm^2. Valve area (Vmax): 1.71 cm^2. - Mitral valve: Calcified annulus. Mildly thickened leaflets . There was mild regurgitation. - Left atrium: The atrium was mildly to moderately dilated. - Right ventricle: The cavity size was mildly dilated. Pacer wire or catheter noted in right ventricle. - Right atrium: Central venous pressure (est): 3 mm Hg. - Atrial  septum: No defect or patent foramen ovale was identified. - Tricuspid valve: There was mild regurgitation. - Pulmonary arteries: PA peak pressure: 42 mm Hg (S). - Pericardium, extracardiac: There was no pericardial effusion.  Impressions:  - Mild LVH with LVEF 87-56%, grade 2 diastolic dysfunction with increased filling pressures. MIld to moderate left atrial enlargement. Mild mitral regurgitation. Very mildly stenotic aortic valve. Device wire noted in right heart. Mild tricuspid regurgitation with PASP 42 mmHg.  2. Lexiscan Cardiolite 06/15/2014: FINDINGS: Pharmacological stress  Baseline EKG shows ventricular pacing. After injection heart rate increased from 61 beats per min up to 75 beats per min, and blood pressure decreased from 139/76 and 113/66. The test was stopped after injection was complete, the patient did not experience any chest pain. Post-injection EKG showed no significant arrhythmias, findings not interpretable for ischemia because she was ventricular paced throughout the study.  Perfusion: No decreased activity in the left ventricle on stress imaging to suggest reversible ischemia or infarction.  Wall Motion: Normal left ventricular wall motion. No left ventricular dilation.  Left Ventricular Ejection Fraction: 77 %  End diastolic volume 55 ml  End systolic volume 12 ml  IMPRESSION: 1. No reversible ischemia or infarction.  2. Normal left ventricular wall motion.  3. Left ventricular ejection fraction 77%  4. Low-risk stress test findings*.  3. Chest CT 07/24/2014: FINDINGS: Musculoskeletal: Degenerative disc disease is present. No aggressive osseous lesions. Thoracic vertebral body height is preserved. Median sternotomy.  Lungs: Dependent atelectasis is present. There is no airspace consolidation. LEFT upper lobe scarring is present extending into the lingula. This is linear and has a typical appearance for scarring.  Ground-glass attenuation nodule is present in the RIGHT middle lobe adjacent to the RIGHT mediastinal border. This probably represents scarring however follow-up is required. This solitary ground-glass attenuation pulmonary nodule measures 6 mm x 7 mm.  On reconstructed images, there is flattening of the hemidiaphragms consistent with hyperinflation and emphysema.  Central airways: Patent.  Vasculature: CABG. Aortic arch atherosclerosis and tortuosity. No acute vascular abnormality. The heart appears mildly enlarged. Aortic diameter is within normal limits.  Effusions: Small bilateral, LEFT-greater-than-RIGHT.  Lymphadenopathy: No axillary adenopathy. No mediastinal or hilar adenopathy.  Esophagus: Small hiatal hernia and patulous gastroesophageal junction.  Upper abdomen: Small hyper attenuating lesion in the LEFT hepatic dome statistically likely represents a flash fill hemangioma. This is best seen on the coronal images.  Other: LEFT subclavian pacemaker.  IMPRESSION: 1. Small bilateral LEFT-greater-than- RIGHT pleural effusions. 2. Solitary ground-glass attenuation RIGHT middle lobe pulmonary nodule. Initial follow-up by chest CT without contrast is recommended in 3 months to confirm persistence. This recommendation follows the consensus statement:  Recommendations for the Management of Subsolid Pulmonary Nodules Detected at CT: A Statement from the [place] as published in Radiology 2013; 266:304-317. 3. CABG and atherosclerosis. 4. Hyperinflation of the chest, suggesting emphysema.   ASSESSMENT AND PLAN:  1. Persistent shortness of breath, mainly exertional. Suspect overall multifactorial, however the presence of persistent atrial fibrillation since late January would coincide better with her symptoms, particularly if contributing to worsening diastolic heart failure. Other noninvasive cardiac testing has been reassuring including ischemic workup as  outlined above. PFT results are noted, although I doubt that she has true COPD with no history of tobacco use. She has continuing follow-up planned with Dr. Luan Pulling since there is a moderate ventilatory defect. I do not think that we need to pursue a cardiac catheterization at this time, but I did discuss with Ms. Giampietro the possibility of pursuing elective cardioversion to see if her storing sinus rhythm might help improve her symptoms. She was somewhat hesitant to consider this, but agreed to see Dr. Lovena Le at his next available visit to get his opinion. She has been on Coumadin and her recent INRs have been therapeutic.  2. Persistent atrial fibrillation since earlier in the year. Rhythm is paced at baseline, no rapid ventricular response, she continues on Coumadin and Toprol-XL.  3. Diastolic heart failure, possibly exacerbated by atrial fibrillation. Lasix was advanced by Dr. Lovena Le, we plan to follow-up with BMET today to see if the dose needs to be adjusted.  4. Multivessel CAD status post previous CABG and percutaneous intervention. Recent Cardiolite was low risk without obvious ischemic territories.  5. ICD in situ with prior history of ventricular tachycardia, also heart block. He is followed by Dr. Lovena Le.  Current medicines were reviewed at length with the patient today.   Orders Placed This Encounter  Procedures  . Basic Metabolic Panel (BMET)    Disposition: FU with Dr. Lovena Le and then can determine whether we proceed with elective cardioversion and follow-up thereafter.   Signed, Satira Sark, MD, [facility] 07/26/2014 10:02 AM    [facility] [address] [place], [place] [address] Phone: [phone]; Fax: [phone]

## 2014-08-01 ENCOUNTER — Encounter: Payer: Self-pay | Admitting: Internal Medicine

## 2014-08-01 ENCOUNTER — Ambulatory Visit (INDEPENDENT_AMBULATORY_CARE_PROVIDER_SITE_OTHER): Payer: Medicare Other | Admitting: Internal Medicine

## 2014-08-01 ENCOUNTER — Ambulatory Visit (INDEPENDENT_AMBULATORY_CARE_PROVIDER_SITE_OTHER): Payer: Medicare Other | Admitting: *Deleted

## 2014-08-01 VITALS — BP 138/60 | HR 72 | Ht 61.0 in | Wt 170.2 lb

## 2014-08-01 DIAGNOSIS — Z5181 Encounter for therapeutic drug level monitoring: Secondary | ICD-10-CM

## 2014-08-01 DIAGNOSIS — I480 Paroxysmal atrial fibrillation: Secondary | ICD-10-CM

## 2014-08-01 DIAGNOSIS — I5032 Chronic diastolic (congestive) heart failure: Secondary | ICD-10-CM | POA: Diagnosis not present

## 2014-08-01 DIAGNOSIS — I10 Essential (primary) hypertension: Secondary | ICD-10-CM

## 2014-08-01 DIAGNOSIS — I482 Chronic atrial fibrillation, unspecified: Secondary | ICD-10-CM

## 2014-08-01 DIAGNOSIS — I472 Ventricular tachycardia, unspecified: Secondary | ICD-10-CM

## 2014-08-01 DIAGNOSIS — I4729 Other ventricular tachycardia: Secondary | ICD-10-CM

## 2014-08-01 LAB — MDC_IDC_ENUM_SESS_TYPE_INCLINIC
Brady Statistic AP VS Percent: 0.02 %
Brady Statistic AS VP Percent: 40.84 %
Brady Statistic AS VS Percent: 0.3 %
Brady Statistic RA Percent Paced: 58.87 %
Brady Statistic RV Percent Paced: 99.68 %
Date Time Interrogation Session: 20160413121720
HIGH POWER IMPEDANCE MEASURED VALUE: 49 Ohm
HighPow Impedance: 19 Ohm
HighPow Impedance: 66 Ohm
Lead Channel Impedance Value: 361 Ohm
Lead Channel Pacing Threshold Amplitude: 0.75 V
Lead Channel Pacing Threshold Pulse Width: 0.4 ms
Lead Channel Setting Pacing Amplitude: 2 V
Lead Channel Setting Sensing Sensitivity: 0.3 mV
MDC IDC MSMT BATTERY REMAINING LONGEVITY: 89 mo
MDC IDC MSMT BATTERY VOLTAGE: 2.99 V
MDC IDC MSMT LEADCHNL RA IMPEDANCE VALUE: 418 Ohm
MDC IDC MSMT LEADCHNL RA SENSING INTR AMPL: 0.375 mV
MDC IDC MSMT LEADCHNL RV SENSING INTR AMPL: 10.625 mV
MDC IDC SET LEADCHNL RV PACING AMPLITUDE: 2.5 V
MDC IDC SET LEADCHNL RV PACING PULSEWIDTH: 0.4 ms
MDC IDC SET ZONE DETECTION INTERVAL: 350 ms
MDC IDC SET ZONE DETECTION INTERVAL: 350 ms
MDC IDC STAT BRADY AP VP PERCENT: 58.85 %
Zone Setting Detection Interval: 300 ms
Zone Setting Detection Interval: 450 ms

## 2014-08-01 LAB — POCT INR: INR: 2.5

## 2014-08-01 NOTE — Assessment & Plan Note (Signed)
We discussed the treatment options in detail. I have recommended admit for Tikosyn. Her INR's have been therapeutic. Will schedule in the next few days.

## 2014-08-01 NOTE — Progress Notes (Signed)
HPI [name] returns today for followup. She is a pleasant 81 yo woman with a h/o VF, CHB, s/p ICD implant who underwent ICD generator change several months ago. In the interim, she has developed atrial fibrillation and worsening sob. While her CXR was not too bad, she has had an elevation in her optivol fluid index which coincides with her going into atrial fib. Her heart failure symptoms are class 3. No/minimal peripheral edema. Allergies  Allergen Reactions  . Codeine Nausea And Vomiting  . Keflex [Cephalexin] Itching     Current Outpatient Prescriptions  Medication Sig Dispense Refill  . acetaminophen (TYLENOL) 500 MG tablet Take 500 mg by mouth daily.     [name] amLODipine (NORVASC) 5 MG tablet TAKE ONE TABLET BY MOUTH ONCE DAILY. 30 tablet 6  . aspirin EC 81 MG tablet Take 81 mg by mouth every morning.     [name] atorvastatin (LIPITOR) 20 MG tablet Take 1 tablet (20 mg total) by mouth at bedtime. 90 tablet 3  . Calcium Carbonate-Vitamin D (CALCIUM 600 + D PO) Take 1 tablet by mouth 2 (two) times daily.    . Cholecalciferol (VITAMIN D) 2000 UNITS CAPS Take 1 capsule by mouth at bedtime.     . folic acid (FOLVITE) 1 MG tablet Take 1 mg by mouth 2 (two) times daily.     . furosemide (LASIX) 20 MG tablet Take one tablet by mouth at noon    . furosemide (LASIX) 40 MG tablet Take one tablet by mouth every morning    . losartan-hydrochlorothiazide (HYZAAR) 100-12.5 MG per tablet Take 1 tablet by mouth every morning.     . metoprolol (TOPROL-XL) 200 MG 24 hr tablet Take 200 mg by mouth every morning.     Vladimir Faster Glycol-Propyl Glycol (SYSTANE OP) Place 1 drop into both eyes daily as needed (Dry eyes).    . potassium chloride SA (K-DUR,KLOR-CON) 20 MEQ tablet Take 20 mEq by mouth 3 (three) times daily.    [name] warfarin (COUMADIN) 2.5 MG tablet Take 1 tablet daily except 1 1/2 tablets on Tuesdays, Thursdays and Saturdays 48 tablet 6  . albuterol (PROVENTIL HFA;VENTOLIN HFA) 108 (90 BASE)  MCG/ACT inhaler Inhale 2 puffs into the lungs every 6 (six) hours as needed for wheezing or shortness of breath. 1 Inhaler 2   No current facility-administered medications for this visit.     Past Medical History  Diagnosis Date  . Osteoarthritis   . Mixed hyperlipidemia   . Coronary atherosclerosis of native coronary artery     Multivessel status post CABG, DES PLA March 2006  . Essential hypertension, benign   . Heart block   . Syncope   . Ventricular tachycardia     Status post ICD  . Ischemic cardiomyopathy     LVEF 55% as of March 2014  . Atrial fibrillation     ROS:   All systems reviewed and negative except as noted in the HPI.   Past Surgical History  Procedure Laterality Date  . Coronary artery bypass graft      LIMA to LAD, SVG to diagonal, SVG to ramus and OM  . Cardiac defibrillator placement      Guidant device  . Yag laser application Left 11/21/1515    Procedure: YAG LASER APPLICATION;  Surgeon: Elta Guadeloupe T. Gershon Crane, MD;  Location: [facility];  Service: Ophthalmology;  Laterality: Left;  . Yag laser application Right 10/04/735    Procedure: YAG LASER APPLICATION;  Surgeon:  Mark T. Gershon Crane, MD;  Location: [facility];  Service: Ophthalmology;  Laterality: Right;  . Implantable cardioverter defibrillator generator change N/A 09/25/2013    Procedure: IMPLANTABLE CARDIOVERTER DEFIBRILLATOR GENERATOR CHANGE;  Surgeon: Evans Lance, MD;  Location: [facility] CATH LAB;  Service: Cardiovascular;  Laterality: N/A;  . Lead revision N/A 09/29/2013    Procedure: LEAD REVISION;  Surgeon: Deboraha Sprang, MD;  Location: [facility] CATH LAB;  Service: Cardiovascular;  Laterality: N/A;     Family History  Problem Relation Age of Onset  . Heart attack Father   . Heart attack Brother      History   Social History  . Marital Status: Widowed    Spouse Name: N/A  . Number of Children: 2  . Years of Education: N/A   Occupational History  . Retired     Equities trader  .     Social History  Main Topics  . Smoking status: Never Smoker   . Smokeless tobacco: Never Used  . Alcohol Use: No  . Drug Use: No  . Sexual Activity: No   Other Topics Concern  . Not on file   Social History Narrative     BP 138/60 mmHg  Pulse 72  Ht 5\' 1"  (1.549 m)  Wt 170 lb 4 oz (77.225 kg)  BMI 32.19 kg/m2  Physical Exam:  stable appearing 81 yo woman,NAD HEENT: Unremarkable Neck:  7 cm JVD, no thyromegally Back:  No CVA tenderness Lungs:  Clear with no wheezes HEART:  Regular rate rhythm, no murmurs, no rubs, no clicks Abd:  soft, positive bowel sounds, no organomegally, no rebound, no guarding Ext:  2 plus pulses, no edema, no cyanosis, no clubbing Skin:  No rashes no nodules Neuro:  CN II through XII intact, motor grossly intact    DEVICE  Normal device function.  See PaceArt for details.   Assess/Plan:

## 2014-08-01 NOTE — Assessment & Plan Note (Signed)
Her blood pressure has been fairly well controlled. She is encouraged to maintain a low sodium diet.

## 2014-08-01 NOTE — Assessment & Plan Note (Signed)
Her symptoms have worsened and are almost certainly due to atrial fib. She will continue her current meds and I have encouraged her to maintain a low sodiu diet and to take her diuretic.

## 2014-08-01 NOTE — Assessment & Plan Note (Signed)
She has had no recurrent ventricular arrhythmias. Will follow.

## 2014-08-01 NOTE — Patient Instructions (Signed)
Your physician wants you to follow-up in: 1 year with Dr.Taylor You will receive a reminder letter in the mail two months in advance. If you don't receive a letter, please call our office to schedule the follow-up appointment.   Remote monitoring is used to monitor your Pacemaker of ICD from home. This monitoring reduces the number of office visits required to check your device to one time per year. It allows [place] to keep an eye on the functioning of your device to ensure it is working properly. You are scheduled for a device check from home on 10/31/14. You may send your transmission at any time that day. If you have a wireless device, the transmission will be sent automatically. After your physician reviews your transmission, you will receive a postcard with your next transmission date.   Elberta Leatherwood Pharm-D will call you from [facility] office to schedule your hospital admission    Thank you for choosing [facility] !

## 2014-08-06 ENCOUNTER — Telehealth: Payer: Self-pay | Admitting: Pharmacist

## 2014-08-06 NOTE — Telephone Encounter (Signed)
New message       Pt would like to know if she needs to take her meds in the morning before seeing Gay Filler

## 2014-08-07 ENCOUNTER — Ambulatory Visit (INDEPENDENT_AMBULATORY_CARE_PROVIDER_SITE_OTHER): Payer: Medicare Other | Admitting: Pharmacist Clinician (PhC)/ Clinical Pharmacy Specialist

## 2014-08-07 ENCOUNTER — Inpatient Hospital Stay (HOSPITAL_COMMUNITY)
Admission: AD | Admit: 2014-08-07 | Discharge: 2014-08-10 | DRG: 309 | Disposition: A | Discharge status: Home / Self Care | Payer: Medicare Other | Source: Ambulatory Visit | Attending: Internal Medicine | Admitting: Internal Medicine

## 2014-08-07 ENCOUNTER — Encounter: Payer: Self-pay | Admitting: Pharmacist

## 2014-08-07 ENCOUNTER — Encounter: Payer: Self-pay | Admitting: Internal Medicine

## 2014-08-07 ENCOUNTER — Encounter (HOSPITAL_COMMUNITY): Payer: Self-pay | Admitting: Nurse Practitioner

## 2014-08-07 ENCOUNTER — Other Ambulatory Visit (INDEPENDENT_AMBULATORY_CARE_PROVIDER_SITE_OTHER): Payer: Medicare Other | Admitting: *Deleted

## 2014-08-07 VITALS — BP 126/72 | HR 61 | Ht 61.0 in | Wt 170.0 lb

## 2014-08-07 DIAGNOSIS — I10 Essential (primary) hypertension: Secondary | ICD-10-CM

## 2014-08-07 DIAGNOSIS — I482 Chronic atrial fibrillation, unspecified: Secondary | ICD-10-CM

## 2014-08-07 DIAGNOSIS — Z8674 Personal history of sudden cardiac arrest: Secondary | ICD-10-CM | POA: Diagnosis not present

## 2014-08-07 DIAGNOSIS — I442 Atrioventricular block, complete: Secondary | ICD-10-CM | POA: Diagnosis present

## 2014-08-07 DIAGNOSIS — Z6831 Body mass index (BMI) 31.0-31.9, adult: Secondary | ICD-10-CM

## 2014-08-07 DIAGNOSIS — Z951 Presence of aortocoronary bypass graft: Secondary | ICD-10-CM

## 2014-08-07 DIAGNOSIS — I4891 Unspecified atrial fibrillation: Secondary | ICD-10-CM | POA: Diagnosis not present

## 2014-08-07 DIAGNOSIS — I2510 Atherosclerotic heart disease of native coronary artery without angina pectoris: Secondary | ICD-10-CM | POA: Diagnosis present

## 2014-08-07 DIAGNOSIS — I25810 Atherosclerosis of coronary artery bypass graft(s) without angina pectoris: Secondary | ICD-10-CM | POA: Diagnosis not present

## 2014-08-07 DIAGNOSIS — I480 Paroxysmal atrial fibrillation: Secondary | ICD-10-CM

## 2014-08-07 DIAGNOSIS — Z8249 Family history of ischemic heart disease and other diseases of the circulatory system: Secondary | ICD-10-CM

## 2014-08-07 DIAGNOSIS — Z7901 Long term (current) use of anticoagulants: Secondary | ICD-10-CM | POA: Diagnosis not present

## 2014-08-07 DIAGNOSIS — E782 Mixed hyperlipidemia: Secondary | ICD-10-CM | POA: Diagnosis present

## 2014-08-07 DIAGNOSIS — Z5181 Encounter for therapeutic drug level monitoring: Secondary | ICD-10-CM

## 2014-08-07 DIAGNOSIS — I5032 Chronic diastolic (congestive) heart failure: Secondary | ICD-10-CM | POA: Diagnosis present

## 2014-08-07 DIAGNOSIS — I481 Persistent atrial fibrillation: Secondary | ICD-10-CM | POA: Diagnosis present

## 2014-08-07 DIAGNOSIS — Z7982 Long term (current) use of aspirin: Secondary | ICD-10-CM | POA: Diagnosis not present

## 2014-08-07 DIAGNOSIS — Z95810 Presence of automatic (implantable) cardiac defibrillator: Secondary | ICD-10-CM | POA: Diagnosis not present

## 2014-08-07 DIAGNOSIS — I255 Ischemic cardiomyopathy: Secondary | ICD-10-CM | POA: Diagnosis present

## 2014-08-07 DIAGNOSIS — Z955 Presence of coronary angioplasty implant and graft: Secondary | ICD-10-CM | POA: Diagnosis not present

## 2014-08-07 HISTORY — DX: Atrioventricular block, complete: I44.2

## 2014-08-07 HISTORY — DX: Ventricular fibrillation: I49.01

## 2014-08-07 HISTORY — DX: Chronic diastolic (congestive) heart failure: I50.32

## 2014-08-07 LAB — BASIC METABOLIC PANEL
BUN: 29 mg/dL — AB (ref 6–23)
CALCIUM: 10 mg/dL (ref 8.4–10.5)
CO2: 33 meq/L — AB (ref 19–32)
Chloride: 104 mEq/L (ref 96–112)
Creatinine, Ser: 1.52 mg/dL — ABNORMAL HIGH (ref 0.40–1.20)
GFR: 34.93 mL/min — ABNORMAL LOW (ref >60.00)
Glucose, Bld: 89 mg/dL (ref 70–99)
Potassium: 4.3 mEq/L (ref 3.5–5.1)
Sodium: 142 mEq/L (ref 135–145)

## 2014-08-07 LAB — POCT INR: INR: 2.5

## 2014-08-07 LAB — MAGNESIUM: Magnesium: 2.1 mg/dL (ref 1.5–2.5)

## 2014-08-07 MED ORDER — METOPROLOL SUCCINATE ER 100 MG PO TB24
100.0000 mg | ORAL_TABLET | Freq: Every morning | ORAL | Status: DC
  Administered 2014-08-08 – 2014-08-10 (×3): 100 mg via ORAL
  Filled 2014-08-07 (×3): qty 1

## 2014-08-07 MED ORDER — WARFARIN SODIUM 2.5 MG PO TABS
2.5000 mg | ORAL_TABLET | ORAL | Status: DC
  Administered 2014-08-08: 2.5 mg via ORAL
  Filled 2014-08-07 (×2): qty 1

## 2014-08-07 MED ORDER — FUROSEMIDE 40 MG PO TABS
40.0000 mg | ORAL_TABLET | Freq: Every day | ORAL | Status: DC
Start: 2014-08-08 — End: 2014-08-10
  Administered 2014-08-08 – 2014-08-10 (×3): 40 mg via ORAL
  Filled 2014-08-07 (×3): qty 1

## 2014-08-07 MED ORDER — POTASSIUM CHLORIDE CRYS ER 20 MEQ PO TBCR
20.0000 meq | EXTENDED_RELEASE_TABLET | Freq: Three times a day (TID) | ORAL | Status: DC
  Administered 2014-08-07 – 2014-08-10 (×9): 20 meq via ORAL
  Filled 2014-08-07 (×11): qty 1

## 2014-08-07 MED ORDER — WARFARIN 1.25 MG HALF TABLET
3.7500 mg | ORAL_TABLET | ORAL | Status: DC
  Administered 2014-08-07 – 2014-08-09 (×2): 3.75 mg via ORAL
  Filled 2014-08-07 (×4): qty 1

## 2014-08-07 MED ORDER — SODIUM CHLORIDE 0.9 % IJ SOLN
3.0000 mL | Freq: Two times a day (BID) | INTRAMUSCULAR | Status: DC
  Administered 2014-08-07 – 2014-08-10 (×6): 3 mL via INTRAVENOUS

## 2014-08-07 MED ORDER — ATORVASTATIN CALCIUM 20 MG PO TABS
20.0000 mg | ORAL_TABLET | Freq: Every day | ORAL | Status: DC
  Administered 2014-08-07 – 2014-08-09 (×3): 20 mg via ORAL
  Filled 2014-08-07 (×4): qty 1

## 2014-08-07 MED ORDER — METOPROLOL SUCCINATE ER 100 MG PO TB24: 200.0000 mg | ORAL_TABLET | Freq: Every morning | ORAL | Status: DC

## 2014-08-07 MED ORDER — WARFARIN - PHARMACIST DOSING INPATIENT
Freq: Every day | Status: DC
  Administered 2014-08-08 – 2014-08-09 (×2)

## 2014-08-07 MED ORDER — AMLODIPINE BESYLATE 5 MG PO TABS
5.0000 mg | ORAL_TABLET | Freq: Every day | ORAL | Status: DC
  Administered 2014-08-08 – 2014-08-10 (×3): 5 mg via ORAL
  Filled 2014-08-07 (×4): qty 1

## 2014-08-07 MED ORDER — FOLIC ACID 1 MG PO TABS
1.0000 mg | ORAL_TABLET | Freq: Two times a day (BID) | ORAL | Status: DC
  Administered 2014-08-07 – 2014-08-10 (×6): 1 mg via ORAL
  Filled 2014-08-07 (×7): qty 1

## 2014-08-07 MED ORDER — ACETAMINOPHEN 500 MG PO TABS
500.0000 mg | ORAL_TABLET | Freq: Every day | ORAL | Status: DC
  Administered 2014-08-08 – 2014-08-10 (×3): 500 mg via ORAL
  Filled 2014-08-07 (×3): qty 1

## 2014-08-07 MED ORDER — ASPIRIN EC 81 MG PO TBEC: 81.0000 mg | DELAYED_RELEASE_TABLET | Freq: Every day | ORAL | Status: DC

## 2014-08-07 MED ORDER — ALBUTEROL SULFATE (2.5 MG/3ML) 0.083% IN NEBU: 2.5000 mg | INHALATION_SOLUTION | Freq: Four times a day (QID) | RESPIRATORY_TRACT | Status: DC | PRN

## 2014-08-07 MED ORDER — DOFETILIDE 125 MCG PO CAPS
125.0000 ug | ORAL_CAPSULE | Freq: Two times a day (BID) | ORAL | Status: DC
  Administered 2014-08-07 – 2014-08-10 (×6): 125 ug via ORAL
  Filled 2014-08-07 (×9): qty 1

## 2014-08-07 MED ORDER — LOSARTAN POTASSIUM 100 MG PO TABS: 100.0000 mg | ORAL_TABLET | Freq: Every day | ORAL | Status: DC

## 2014-08-07 MED ORDER — SODIUM CHLORIDE 0.9 % IJ SOLN: 3.0000 mL | INTRAMUSCULAR | Status: DC | PRN

## 2014-08-07 MED ORDER — SODIUM CHLORIDE 0.9 % IV SOLN
250.0000 mL | INTRAVENOUS | Status: DC | PRN
  Administered 2014-08-09: 10:00:00 via INTRAVENOUS

## 2014-08-07 MED ORDER — LOSARTAN POTASSIUM 50 MG PO TABS
100.0000 mg | ORAL_TABLET | Freq: Every day | ORAL | Status: DC
  Administered 2014-08-08 – 2014-08-10 (×3): 100 mg via ORAL
  Filled 2014-08-07 (×3): qty 2

## 2014-08-07 NOTE — Progress Notes (Signed)
ANTICOAGULATION CONSULT NOTE - Initial Consult  Pharmacy Consult for warfarin and Tikosyn Indication: atrial fibrillation  Allergies  Allergen Reactions  . Codeine Nausea And Vomiting  . Keflex [Cephalexin] Itching    Patient Measurements: Height: 5\' 1"  (154.9 cm) Weight: 173 lb 4.5 oz (78.6 kg) IBW/kg (Calculated) : 47.8  Vital Signs: Temp: 97.5 F (36.4 C) (04/19 1452) Temp Source: Oral (04/19 1452) BP: 112/55 mmHg (04/19 1452) Pulse Rate: 67 (04/19 1452)  Labs:  Recent Labs  08/07/14 1037 08/07/14 1101  INR  --  2.5  CREATININE 1.52*  --    CMP Latest Ref Rng 08/07/2014 07/26/2014 06/09/2014  Glucose 70 - 99 mg/dL 89 91 85  BUN 6 - 23 mg/dL 29(H) 33(H) 36(H)  Creatinine 0.40 - 1.20 mg/dL 1.52(H) 1.26(H) 1.40(H)  Sodium 135 - 145 mEq/L 142 144 143  Potassium 3.5 - 5.1 mEq/L 4.3 4.4 4.4  Chloride 96 - 112 mEq/L 104 104 108  CO2 19 - 32 mEq/L 33(H) 25 27  Calcium 8.4 - 10.5 mg/dL 10.0 9.5 9.8  Total Protein 6.0 - 8.3 g/dL - - 6.9  Total Bilirubin 0.3 - 1.2 mg/dL - - 1.6(H)  Alkaline Phos 39 - 117 U/L - - 51  AST 0 - 37 U/L - - 18  ALT 0 - 35 U/L - - 18   Mg 2.1  Estimated Creatinine Clearance: 28 mL/min (by C-G formula based on Cr of 1.52).   Medical History: Past Medical History  Diagnosis Date  . Osteoarthritis   . Mixed hyperlipidemia   . Coronary atherosclerosis of native coronary artery     Multivessel status post CABG, DES PLA March 2006  . Essential hypertension, benign   . Complete heart block   . Ventricular fibrillation 2003    a. seen on PPM interrogation a/w syncope  . Atrial fibrillation     a. anticoagulated with Warfarin  . Chronic diastolic heart failure     Medications:  Prescriptions prior to admission  Medication Sig Dispense Refill Last Dose  . acetaminophen (TYLENOL) 500 MG tablet Take 500 mg by mouth daily.    Taking  . albuterol (PROVENTIL HFA;VENTOLIN HFA) 108 (90 BASE) MCG/ACT inhaler Inhale 2 puffs into the lungs every 6  (six) hours as needed for wheezing or shortness of breath. 1 Inhaler 2 Taking  . amLODipine (NORVASC) 5 MG tablet TAKE ONE TABLET BY MOUTH ONCE DAILY. 30 tablet 6 Taking  . aspirin EC 81 MG tablet Take 81 mg by mouth every morning.    Taking  . atorvastatin (LIPITOR) 20 MG tablet Take 1 tablet (20 mg total) by mouth at bedtime. 90 tablet 3 Taking  . Calcium Carbonate-Vitamin D (CALCIUM 600 + D PO) Take 1 tablet by mouth 2 (two) times daily.   Taking  . Cholecalciferol (VITAMIN D) 2000 UNITS CAPS Take 1 capsule by mouth at bedtime.    Taking  . folic acid (FOLVITE) 1 MG tablet Take 1 mg by mouth 2 (two) times daily.    Taking  . furosemide (LASIX) 20 MG tablet Take one tablet by mouth at noon     . furosemide (LASIX) 40 MG tablet Take one tablet by mouth every morning     . losartan (COZAAR) 100 MG tablet Take 1 tablet (100 mg total) by mouth daily. 90 tablet 1   . losartan-hydrochlorothiazide (HYZAAR) 100-12.5 MG per tablet Take by mouth daily.     . metoprolol (TOPROL-XL) 200 MG 24 hr tablet Take 200 mg by  mouth every morning.    Taking  . Polyethyl Glycol-Propyl Glycol (SYSTANE OP) Place 1 drop into both eyes daily as needed (Dry eyes).   Taking  . potassium chloride SA (K-DUR,KLOR-CON) 20 MEQ tablet Take 20 mEq by mouth 3 (three) times daily.   Taking  . warfarin (COUMADIN) 2.5 MG tablet Take 1 tablet daily except 1 1/2 tablets on Tuesdays, Thursdays and Saturdays 48 tablet 6 Taking    Assessment: 81 year old woman on chronic warfarin for AFIB admitted to the hospital today for Tikosyn initiation.  Mg and K in acceptable range.  CrCl 28 mL/min.  Tikosyn dose is 180mcg BID.  INR 2.5 today on 2.5mg  daily except 3.75mg  on Tues, Thurs, and Sat.  She has not taken her warfarin dose today.  Goal of Therapy:  INR 2-3  Plan:  Continue home dosage of warfarin Daily protimes Bmet and Mg ordered for tomorrow  Candie Mile 08/07/2014,5:04 PM

## 2014-08-07 NOTE — Telephone Encounter (Signed)
Returned call, patient ok to take all meds except losartan/hctz.  Pt voiced understanding.

## 2014-08-07 NOTE — H&P (Signed)
ELECTROPHYSIOLOGY HISTORY AND PHYSICAL    Patient ID: [name] [name] MRN: [id number], DOB/AGE: Jun 04, 1931 81 y.o.  Admit date: 08/07/2014 Date of Consult: 08/07/2014  Primary Physician: Alonza Bogus, MD Primary Cardiologist: Domenic Polite Electrophysiologist: Lovena Le  CC: Here for tikosyn admission  HPI:  [name] is a 81 y.o. female with a past medical history significant for complete heart block s/p PPM implant, VF arrest s/p upgrade to ICD, CAD s/p CABG, and persistent atrial fibrillation. Her VF arrest was in 2003.  At that time she had a frank syncopal spell and interrogation of her device demonstrated VF.  She underwent ICD upgrade at that time.    She has had worsening diastolic heart failure associated with atrial fibrillation.  ICD interrogation has demonstrated decreased activity level and increased Optivol since onset of AF.  Dr Lovena Le discussed options with the patient and she is being admitted today for Tikosyn initiation.  She was on Losartan/HCT at home with last dose yesterday.   Last echo 08/2013 demonstrated EF 72-53%, grade 2 diastolic dysfunction, LA 40.   She currently states that she has shortness of breath with exertion, some RLE edema.  She denies chest pain, orthopnea, recent fevers, chills, nausea or vomiting.   Past Medical History  Diagnosis Date  . Osteoarthritis   . Mixed hyperlipidemia   . Coronary atherosclerosis of native coronary artery     Multivessel status post CABG, DES PLA March 2006  . Essential hypertension, benign   . Heart block   . Syncope   . Ventricular tachycardia     Status post ICD  . Ischemic cardiomyopathy     LVEF 55% as of March 2014  . Atrial fibrillation      Surgical History:  Past Surgical History  Procedure Laterality Date  . Coronary artery bypass graft      LIMA to LAD, SVG to diagonal, SVG to ramus and OM  . Cardiac defibrillator placement      Guidant device  . Yag laser application Left 09/23/4401   Procedure: YAG LASER APPLICATION;  Surgeon: Elta Guadeloupe T. Gershon Crane, MD;  Location: [facility];  Service: Ophthalmology;  Laterality: Left;  . Yag laser application Right 4/74/2595    Procedure: YAG LASER APPLICATION;  Surgeon: Elta Guadeloupe T. Gershon Crane, MD;  Location: [facility];  Service: Ophthalmology;  Laterality: Right;  . Implantable cardioverter defibrillator generator change N/A 09/25/2013    Procedure: IMPLANTABLE CARDIOVERTER DEFIBRILLATOR GENERATOR CHANGE;  Surgeon: Evans Lance, MD;  Location: [facility] CATH LAB;  Service: Cardiovascular;  Laterality: N/A;  . Lead revision N/A 09/29/2013    Procedure: LEAD REVISION;  Surgeon: Deboraha Sprang, MD;  Location: [facility] CATH LAB;  Service: Cardiovascular;  Laterality: N/A;     Prescriptions prior to admission  Medication Sig Dispense Refill Last Dose  . acetaminophen (TYLENOL) 500 MG tablet Take 500 mg by mouth daily.    Taking  . albuterol (PROVENTIL HFA;VENTOLIN HFA) 108 (90 BASE) MCG/ACT inhaler Inhale 2 puffs into the lungs every 6 (six) hours as needed for wheezing or shortness of breath. 1 Inhaler 2 Taking  . amLODipine (NORVASC) 5 MG tablet TAKE ONE TABLET BY MOUTH ONCE DAILY. 30 tablet 6 Taking  . aspirin EC 81 MG tablet Take 81 mg by mouth every morning.    Taking  . atorvastatin (LIPITOR) 20 MG tablet Take 1 tablet (20 mg total) by mouth at bedtime. 90 tablet 3 Taking  . Calcium Carbonate-Vitamin D (CALCIUM 600 + D PO) Take 1 tablet by  mouth 2 (two) times daily.   Taking  . Cholecalciferol (VITAMIN D) 2000 UNITS CAPS Take 1 capsule by mouth at bedtime.    Taking  . folic acid (FOLVITE) 1 MG tablet Take 1 mg by mouth 2 (two) times daily.    Taking  . furosemide (LASIX) 20 MG tablet Take one tablet by mouth at noon     . furosemide (LASIX) 40 MG tablet Take one tablet by mouth every morning     . losartan (COZAAR) 100 MG tablet Take 1 tablet (100 mg total) by mouth daily. 90 tablet 1   . losartan-hydrochlorothiazide (HYZAAR) 100-12.5 MG per tablet Take by mouth daily.      . metoprolol (TOPROL-XL) 200 MG 24 hr tablet Take 200 mg by mouth every morning.    Taking  . Polyethyl Glycol-Propyl Glycol (SYSTANE OP) Place 1 drop into both eyes daily as needed (Dry eyes).   Taking  . potassium chloride SA (K-DUR,KLOR-CON) 20 MEQ tablet Take 20 mEq by mouth 3 (three) times daily.   Taking  . warfarin (COUMADIN) 2.5 MG tablet Take 1 tablet daily except 1 1/2 tablets on Tuesdays, Thursdays and Saturdays 48 tablet 6 Taking    Inpatient Medications:   Allergies:  Allergies  Allergen Reactions  . Codeine Nausea And Vomiting  . Keflex [Cephalexin] Itching    History   Social History  . Marital Status: Widowed    Spouse Name: N/A  . Number of Children: 2  . Years of Education: N/A   Occupational History  . Retired     Equities trader  .     Social History Main Topics  . Smoking status: Never Smoker   . Smokeless tobacco: Never Used  . Alcohol Use: No  . Drug Use: No  . Sexual Activity: No   Other Topics Concern  . Not on file   Social History Narrative     Family History  Problem Relation Age of Onset  . Heart attack Father   . Heart attack Brother      Review of Systems: All other systems reviewed and are otherwise negative except as noted above.  Physical Exam: Filed Vitals:   08/07/14 1452  BP: 112/55  Pulse: 67  Temp: 97.5 F (36.4 C)  TempSrc: Oral  Resp: 18  Height: 5\' 1"  (1.549 m)  Weight: 173 lb 4.5 oz (78.6 kg)  SpO2: 97%    GEN- The patient is elderly appearing, alert and oriented x 3 today.   HEENT: normocephalic, atraumatic; sclera clear, conjunctiva pink; hearing intact; oropharynx clear; neck supple  Lungs- Clear to ausculation bilaterally, normal work of breathing.  No wheezes, rales, rhonchi Heart- Regular rate and rhythm (paced) GI- soft, non-tender, non-distended, bowel sounds present  Extremities- no clubbing, cyanosis, or edema; DP/PT/radial pulses 1+ bilaterally MS- no significant deformity or  atrophy Skin- warm and dry, no rash or lesion Psych- euthymic mood, full affect Neuro- strength and sensation are intact  Labs:   Lab Results  Component Value Date   WBC 9.1 06/09/2014   HGB 11.3* 06/09/2014   HCT 35.5* 06/09/2014   MCV 93.4 06/09/2014   PLT 242 06/09/2014    Recent Labs Lab 08/07/14 1037  NA 142  K 4.3  CL 104  CO2 33*  BUN 29*  CREATININE 1.52*  CALCIUM 10.0  GLUCOSE 89      Radiology/Studies: Ct Chest W Contrast 07/24/2014   CLINICAL DATA:  Short of breath.  Onset of symptoms 05/21/2014.  EXAM:  CT CHEST WITH CONTRAST  TECHNIQUE: Multidetector CT imaging of the chest was performed during intravenous contrast administration.  CONTRAST:  66mL OMNIPAQUE IOHEXOL 300 MG/ML SOLN reduced dose administered secondary to renal insufficiency.  COMPARISON:  Chest radiographs dating back to 2014.  FINDINGS: Musculoskeletal: Degenerative disc disease is present. No aggressive osseous lesions. Thoracic vertebral body height is preserved. Median sternotomy.  Lungs: Dependent atelectasis is present. There is no airspace consolidation. LEFT upper lobe scarring is present extending into the lingula. This is linear and has a typical appearance for scarring. Ground-glass attenuation nodule is present in the RIGHT middle lobe adjacent to the RIGHT mediastinal border. This probably represents scarring however follow-up is required. This solitary ground-glass attenuation pulmonary nodule measures 6 mm x 7 mm.  On reconstructed images, there is flattening of the hemidiaphragms consistent with hyperinflation and emphysema.  Central airways: Patent.  Vasculature: CABG. Aortic arch atherosclerosis and tortuosity. No acute vascular abnormality. The heart appears mildly enlarged. Aortic diameter is within normal limits.  Effusions: Small bilateral, LEFT-greater-than-RIGHT.  Lymphadenopathy: No axillary adenopathy. No mediastinal or hilar adenopathy.  Esophagus: Small hiatal hernia and patulous  gastroesophageal junction.  Upper abdomen: Small hyper attenuating lesion in the LEFT hepatic dome statistically likely represents a flash fill hemangioma. This is best seen on the coronal images.  Other: LEFT subclavian pacemaker.  IMPRESSION: 1. Small bilateral LEFT-greater-than- RIGHT pleural effusions. 2. Solitary ground-glass attenuation RIGHT middle lobe pulmonary nodule. Initial follow-up by chest CT without contrast is recommended in 3 months to confirm persistence. This recommendation follows the consensus statement: Recommendations for the Management of Subsolid Pulmonary Nodules Detected at CT: A Statement from the [place] as published in Radiology 2013; 266:304-317. 3. CABG and atherosclerosis. 4. Hyperinflation of the chest, suggesting emphysema.   Electronically Signed   By: Dereck Ligas M.D.   On: 07/24/2014 11:39    KGM:[id number] by Dr Lovena Le (not scanned in currently), QTc 493  TELEMETRY: atrial fibrillation with ventricular pacing  DEVICE HISTORY: BSX dual chamber pacemaker implanted 2002; upgrade to MDT ICD 2003 2/2 VF; ICD gen change 2009; ICD gen change 09/2013; RV lead repositioned in header 09/2013  Assessment/Plan: 1. Persistent atrial fibrillation Plan for Tikosyn initiation as per Dr Tanna Furry plan Due to CrCl, will need to start at 129mcg twice daily.  Keep K >3.9, Mg >1.9 Monitor QTc Continue Warfarin for CHADS2VASC score of at least 5  2.  Chronic diastolic heart failure Felt to be exacerbated by AF  Stop HCTZ Decrease Lasix to 40mg  daily  3.  CAD No recent ischemic symptoms  4.  HTN Stable No change required today   Signed, Chanetta Marshall, NP 08/07/2014 4:02 PM   I have seen, examined the patient, and reviewed the above assessment and plan.  Changes to above are made where necessary.  Admit for tikosyn load.  GIven CrCl, will start tikosyn 125 mcg BID.  Stop ASA and decrease toprol to 100mg  daily. Dr Lovena Le to follow while here.  Co Sign:  Thompson Grayer, MD 08/07/2014 5:15 PM

## 2014-08-07 NOTE — Progress Notes (Signed)
08/07/2014 [name] [name] 1932-02-25 [id number]   HPI:  [name] [name] is a 81 y.o. female patient of Dr Lovena Le, with a [place] below who presents today for Tikosyn initiation.  [name] [name] is an 81 yo F pt of Dr. Lovena Le who was seen for Tikosyn initiation. She was last seen by Dr. Lovena Le on 08/01/14. She has a PMH significant for VF, CHB, s/p ICD implant who underwent ICD generator change several months ago. In the interim, she has developed atrial fibrillation and worsening sob.  Pt is more symptomatic with CHF when in afib. Dr. Aundra Dubin discussed options with patient and given her other comorbidities, plan was to start Tikosyn.   Reviewed pt's chart. Her medication list includes losartan/hctz 100/12.5 mg.  She did not take it this am.  Will change her prescription to losartan 100 mg and monitor BP.  She has never taken any other antiarrhythmics. She is adequately anticoagulated with warfarin and her INR today was 2.5.  Last INR check was on 4/13 in [place] and was also 2.5.  Patient reports compliance over the last 30 days.   Spoke with pharmacy regarding cost. Tikosyn is covered with no PA required. Her copay will be approximately $62 per month (percentage not set copay).   EKG reviewed by Dr. Lovena Le: Ventricualr paced rhythm, rate of 61 bpm. QTc 493 msec.    Current Outpatient Prescriptions  Medication Sig Dispense Refill  . acetaminophen (TYLENOL) 500 MG tablet Take 500 mg by mouth daily.     [name] albuterol (PROVENTIL HFA;VENTOLIN HFA) 108 (90 BASE) MCG/ACT inhaler Inhale 2 puffs into the lungs every 6 (six) hours as needed for wheezing or shortness of breath. 1 Inhaler 2  . amLODipine (NORVASC) 5 MG tablet TAKE ONE TABLET BY MOUTH ONCE DAILY. 30 tablet 6  . aspirin EC 81 MG tablet Take 81 mg by mouth every morning.     [name] atorvastatin (LIPITOR) 20 MG tablet Take 1 tablet (20 mg total) by mouth at bedtime. 90 tablet 3  . Calcium Carbonate-Vitamin D (CALCIUM 600 + D PO) Take 1 tablet  by mouth 2 (two) times daily.    . Cholecalciferol (VITAMIN D) 2000 UNITS CAPS Take 1 capsule by mouth at bedtime.     . folic acid (FOLVITE) 1 MG tablet Take 1 mg by mouth 2 (two) times daily.     . furosemide (LASIX) 20 MG tablet Take one tablet by mouth at noon    . furosemide (LASIX) 40 MG tablet Take one tablet by mouth every morning    . losartan-hydrochlorothiazide (HYZAAR) 100-12.5 MG per tablet Take 1 tablet by mouth every morning.     . metoprolol (TOPROL-XL) 200 MG 24 hr tablet Take 200 mg by mouth every morning.     [name] [name] Glycol-Propyl Glycol (SYSTANE OP) Place 1 drop into both eyes daily as needed (Dry eyes).    . potassium chloride SA (K-DUR,KLOR-CON) 20 MEQ tablet Take 20 mEq by mouth 3 (three) times daily.    [name] warfarin (COUMADIN) 2.5 MG tablet Take 1 tablet daily except 1 1/2 tablets on Tuesdays, Thursdays and Saturdays 48 tablet 6   No current facility-administered medications for this visit.    Allergies  Allergen Reactions  . Codeine Nausea And Vomiting  . Keflex [Cephalexin] Itching    Past Medical History  Diagnosis Date  . Osteoarthritis   . Mixed hyperlipidemia   . Coronary atherosclerosis of native coronary artery     Multivessel status post  CABG, DES PLA March 2006  . Essential hypertension, benign   . Heart block   . Syncope   . Ventricular tachycardia     Status post ICD  . Ischemic cardiomyopathy     LVEF 55% as of March 2014  . Atrial fibrillation     Blood pressure 126/72, pulse 61, height 5\' 1"  (1.549 m), weight 170 lb (77.111 kg).   Reviewed pt's labs. K 4.3, Mg 2.1. QTc was acceptable on today's EKG. Okay to start Tikosyn. CrCl- 49mL/min. Would start at 125 mcg BID. Pt is aware of results and is at the hospital awaiting admission.   Tommy Medal PharmD CPP [facility]

## 2014-08-08 ENCOUNTER — Encounter (HOSPITAL_COMMUNITY): Payer: Self-pay | Admitting: General Practice

## 2014-08-08 LAB — BASIC METABOLIC PANEL
Anion gap: 11 (ref 5–15)
BUN: 30 mg/dL — ABNORMAL HIGH (ref 6–23)
CO2: 26 mmol/L (ref 19–32)
Calcium: 9.1 mg/dL (ref 8.4–10.5)
Chloride: 107 mmol/L (ref 96–112)
Creatinine, Ser: 1.61 mg/dL — ABNORMAL HIGH (ref 0.50–1.10)
GFR calc Af Amer: 34 mL/min — ABNORMAL LOW (ref >90)
GFR calc non Af Amer: 29 mL/min — ABNORMAL LOW (ref >90)
Glucose, Bld: 122 mg/dL — ABNORMAL HIGH (ref 70–99)
Potassium: 3.9 mmol/L (ref 3.5–5.1)
Sodium: 144 mmol/L (ref 135–145)

## 2014-08-08 LAB — MAGNESIUM: Magnesium: 2.1 mg/dL (ref 1.5–2.5)

## 2014-08-08 LAB — PROTIME-INR
INR: 2.33 — ABNORMAL HIGH (ref 0.00–1.49)
PROTHROMBIN TIME: 25.8 s — AB (ref 11.6–15.2)

## 2014-08-08 NOTE — Care Management Note (Signed)
    Page 1 of 1   08/10/2014     5:31:27 PM CARE MANAGEMENT NOTE 08/10/2014  Patient:  [name] [name], [name] [name]   Account Number:  [id number]  Date Initiated:  08/08/2014  Documentation initiated by:  Zaniya Mcaulay  Subjective/Objective Assessment:   Pt adm on 08/07/14 with Afib for Tikosyn initiation.  PTA, pt resides at home alone and is independent.     Action/Plan:   Will follow for discharge needs as pt progresses.   Anticipated DC Date:  08/11/2014   Anticipated DC Plan:  HOME/SELF CARE      DC Planning Services  CM consult  Medication Assistance      Choice offered to / List presented to:             Status of service:  Completed, signed off Medicare Important Message given?  YES (If response is "NO", the following Medicare IM given date fields will be blank) Date Medicare IM given:  08/10/2014 Medicare IM given by:  Babette Stum Date Additional Medicare IM given:   Additional Medicare IM given by:    Discharge Disposition:  HOME/SELF CARE  Per UR Regulation:  Reviewed for med. necessity/level of care/duration of stay  If discussed at [place] of Stay Meetings, dates discussed:    Comments:  08/10/14 Ellan Lambert, RN, BSN [phone] Spoke with pt's pharmacy, [facility]: they can dispense Tikosyn at dc.  Pt provided with one week's supply of med at dc.  Copay will be $62/month.

## 2014-08-08 NOTE — Progress Notes (Signed)
ELECTROPHYSIOLOGY ROUNDING NOTE    SUBJECTIVE: The patient is doing well today.  She has slightly increased shortness of breath.  No chest pain  CURRENT MEDICATIONS: . acetaminophen  500 mg Oral Daily  . amLODipine  5 mg Oral Daily  . atorvastatin  20 mg Oral QHS  . dofetilide  125 mcg Oral BID  . folic acid  1 mg Oral BID  . furosemide  40 mg Oral Daily  . losartan  100 mg Oral Daily  . metoprolol  100 mg Oral q morning - 10a  . potassium chloride SA  20 mEq Oral TID  . sodium chloride  3 mL Intravenous Q12H  . warfarin  2.5 mg Oral Once per day on Sun Mon Wed Fri  . warfarin  3.75 mg Oral Once per day on Tue Thu Sat  . Warfarin - Pharmacist Dosing Inpatient   Does not apply q1800      OBJECTIVE: Telemetry reveals atrial fibrillation with ventricular pacing  Physical Exam: Filed Vitals:   08/07/14 1452 08/07/14 2140 08/08/14 0109 08/08/14 0534  BP: 112/55 115/59 117/54 142/61  Pulse: 67 64 59 63  Temp: 97.5 F (36.4 C) 98.4 F (36.9 C) 97.5 F (36.4 C) 97.9 F (36.6 C)  TempSrc: Oral Oral Oral Oral  Resp: 18 18 18 18   Height: 5\' 1"  (1.549 m)     Weight: 173 lb 4.5 oz (78.6 kg)   168 lb 11.2 oz (76.522 kg)  SpO2: 97% 99% 95% 97%    Intake/Output Summary (Last 24 hours) at 08/08/14 0802 Last data filed at 08/08/14 0300  Gross per 24 hour  Intake    240 ml  Output    240 ml  Net      0 ml    GEN- The patient is well appearing, alert and oriented x 3 today.   HEENT: normocephalic, atraumatic; sclera clear, conjunctiva pink; hearing intact; oropharynx clear; neck supple, no JVP Lymph- no cervical lymphadenopathy Lungs- Slightly increased WOB, crackles at the bases Heart- Regular rate and rhythm (paced) GI- soft, non-tender, non-distended, bowel sounds present  Extremities- no clubbing, cyanosis, or edema; DP/PT/radial pulses 1+ bilaterally MS- no significant deformity or atrophy Skin- warm and dry, no rash or lesion Neuro- strength and sensation are  intact   LABS: Basic Metabolic Panel:  Recent Labs  08/07/14 1037 08/08/14 0344  NA 142 144  K 4.3 3.9  CL 104 107  CO2 33* 26  GLUCOSE 89 122*  BUN 29* 30*  CREATININE 1.52* 1.61*  CALCIUM 10.0 9.1  MG 2.1 2.1    RADIOLOGY: Ct Chest W Contrast 07/24/2014   CLINICAL DATA:  Short of breath.  Onset of symptoms 05/21/2014.  EXAM: CT CHEST WITH CONTRAST  TECHNIQUE: Multidetector CT imaging of the chest was performed during intravenous contrast administration.  CONTRAST:  27mL OMNIPAQUE IOHEXOL 300 MG/ML SOLN reduced dose administered secondary to renal insufficiency.  COMPARISON:  Chest radiographs dating back to 2014.  FINDINGS: Musculoskeletal: Degenerative disc disease is present. No aggressive osseous lesions. Thoracic vertebral body height is preserved. Median sternotomy.  Lungs: Dependent atelectasis is present. There is no airspace consolidation. LEFT upper lobe scarring is present extending into the lingula. This is linear and has a typical appearance for scarring. Ground-glass attenuation nodule is present in the RIGHT middle lobe adjacent to the RIGHT mediastinal border. This probably represents scarring however follow-up is required. This solitary ground-glass attenuation pulmonary nodule measures 6 mm x 7 mm.  On reconstructed images,  there is flattening of the hemidiaphragms consistent with hyperinflation and emphysema.  Central airways: Patent.  Vasculature: CABG. Aortic arch atherosclerosis and tortuosity. No acute vascular abnormality. The heart appears mildly enlarged. Aortic diameter is within normal limits.  Effusions: Small bilateral, LEFT-greater-than-RIGHT.  Lymphadenopathy: No axillary adenopathy. No mediastinal or hilar adenopathy.  Esophagus: Small hiatal hernia and patulous gastroesophageal junction.  Upper abdomen: Small hyper attenuating lesion in the LEFT hepatic dome statistically likely represents a flash fill hemangioma. This is best seen on the coronal images.  Other:  LEFT subclavian pacemaker.  IMPRESSION: 1. Small bilateral LEFT-greater-than- RIGHT pleural effusions. 2. Solitary ground-glass attenuation RIGHT middle lobe pulmonary nodule. Initial follow-up by chest CT without contrast is recommended in 3 months to confirm persistence. This recommendation follows the consensus statement: Recommendations for the Management of Subsolid Pulmonary Nodules Detected at CT: A Statement from the [place] as published in Radiology 2013; 266:304-317. 3. CABG and atherosclerosis. 4. Hyperinflation of the chest, suggesting emphysema.   Electronically Signed   By: Dereck Ligas M.D.   On: 07/24/2014 11:39    ASSESSMENT AND PLAN:  Active Problems:   Atrial fibrillation  1.  Persistent atrial fibrillation Admitted 08-07-14 for Tikosyn initiation CrCl 33.5 - on 151mcg twice daily QTc, K, Mg stable Plan DCCV tomorrow if not converted spontaneously Continue Warfarin for CHADS2VASC score of 5  2.  Chronic diastolic heart failure Increased shortness of breath this morning Will give lasix early today SOB will likely improve with restoration of SR  3.  CAD No recent ischemic symptoms  4.  HTN Stable No change required today   Chanetta Marshall, NP 08/08/2014 8:02 AM  EP Attending  Patient seen and examined. Agree with above. The patient has received a single dose of Tikosyn. She has an ICD. Her kidney function is on low side but I suspect she will be ok to continue tikosyn, especially with ICD as back up. She has gone from class 2A to 3B with regard to CHF in atrial fib. Agree with IV lasix today. Follow QT and renal function.  Mikle Bosworth.D.

## 2014-08-08 NOTE — Progress Notes (Signed)
[place] for warfarin Indication: atrial fibrillation  Allergies  Allergen Reactions  . Codeine Nausea And Vomiting  . Keflex [Cephalexin] Itching and Rash    Patient Measurements: Height: 5\' 1"  (154.9 cm) Weight: 168 lb 11.2 oz (76.522 kg) IBW/kg (Calculated) : 47.8  Vital Signs: Temp: 97.9 F (36.6 C) (04/20 0534) Temp Source: Oral (04/20 0534) BP: 119/68 mmHg (04/20 0842) Pulse Rate: 63 (04/20 0842)  Labs:  Recent Labs  08/07/14 1037 08/07/14 1101 08/08/14 0344  LABPROT  --   --  25.8*  INR  --  2.5 2.33*  CREATININE 1.52*  --  1.61*   CMP Latest Ref Rng 08/08/2014 08/07/2014 07/26/2014  Glucose 70 - 99 mg/dL 122(H) 89 91  BUN 6 - 23 mg/dL 30(H) 29(H) 33(H)  Creatinine 0.50 - 1.10 mg/dL 1.61(H) 1.52(H) 1.26(H)  Sodium 135 - 145 mmol/L 144 142 144  Potassium 3.5 - 5.1 mmol/L 3.9 4.3 4.4  Chloride 96 - 112 mmol/L 107 104 104  CO2 19 - 32 mmol/L 26 33(H) 25  Calcium 8.4 - 10.5 mg/dL 9.1 10.0 9.5  Total Protein 6.0 - 8.3 g/dL - - -  Total Bilirubin 0.3 - 1.2 mg/dL - - -  Alkaline Phos 39 - 117 U/L - - -  AST 0 - 37 U/L - - -  ALT 0 - 35 U/L - - -   Mg 2.1  Estimated Creatinine Clearance: 26.1 mL/min (by C-G formula based on Cr of 1.61).   Medical History: Past Medical History  Diagnosis Date  . Osteoarthritis   . Mixed hyperlipidemia   . Coronary atherosclerosis of native coronary artery     Multivessel status post CABG, DES PLA March 2006  . Essential hypertension, benign   . Complete heart block   . Ventricular fibrillation 2003    a. seen on PPM interrogation a/w syncope  . Atrial fibrillation     a. anticoagulated with Warfarin  . Chronic diastolic heart failure     Medications:  Prescriptions prior to admission  Medication Sig Dispense Refill Last Dose  . acetaminophen (TYLENOL) 500 MG tablet Take 500 mg by mouth daily.    08/06/2014 at Unknown time  . amLODipine (NORVASC) 5 MG tablet TAKE ONE TABLET BY  MOUTH ONCE DAILY. 30 tablet 6 08/07/2014 at Unknown time  . aspirin EC 81 MG tablet Take 81 mg by mouth every morning.    08/07/2014 at Unknown time  . atorvastatin (LIPITOR) 20 MG tablet Take 1 tablet (20 mg total) by mouth at bedtime. 90 tablet 3 08/06/2014 at Unknown time  . Calcium Carbonate-Vitamin D (CALCIUM 600 + D PO) Take 1 tablet by mouth 2 (two) times daily.   08/07/2014 at Unknown time  . Cholecalciferol (VITAMIN D) 2000 UNITS CAPS Take 1 capsule by mouth at bedtime.    08/06/2014 at Unknown time  . folic acid (FOLVITE) 1 MG tablet Take 1 mg by mouth 2 (two) times daily.    08/07/2014 at Unknown time  . furosemide (LASIX) 20 MG tablet Take one tablet by mouth at noon   08/06/2014 at Unknown time  . furosemide (LASIX) 40 MG tablet Take one tablet by mouth every morning   08/07/2014 at Unknown time  . losartan-hydrochlorothiazide (HYZAAR) 100-12.5 MG per tablet Take by mouth daily.   08/06/2014 at Unknown time  . metoprolol (TOPROL-XL) 200 MG 24 hr tablet Take 200 mg by mouth every morning.    08/07/2014 at 0800  . Polyethyl  Glycol-Propyl Glycol (SYSTANE OP) Place 1 drop into both eyes daily as needed (Dry eyes).   Past Week at Unknown time  . potassium chloride SA (K-DUR,KLOR-CON) 20 MEQ tablet Take 20 mEq by mouth 3 (three) times daily.   08/07/2014 at Unknown time  . warfarin (COUMADIN) 2.5 MG tablet Take 1 tablet daily except 1 1/2 tablets on Tuesdays, Thursdays and Saturdays 48 tablet 6 08/06/2014 at Unknown time  . albuterol (PROVENTIL HFA;VENTOLIN HFA) 108 (90 BASE) MCG/ACT inhaler Inhale 2 puffs into the lungs every 6 (six) hours as needed for wheezing or shortness of breath. (Patient not taking: Reported on 08/07/2014) 1 Inhaler 2 Not Taking at Unknown time  . losartan (COZAAR) 100 MG tablet Take 1 tablet (100 mg total) by mouth daily. (Patient not taking: Reported on 08/07/2014) 90 tablet 1 Not Taking at Unknown time    Assessment: 81 year old woman on chronic warfarin for AFIB admitted to  the hospital 4/19 for Tikosyn initiation.  Mg and K remain in acceptable range.   INR remains therapeutic on 2.5mg  daily except 3.75mg  on Tues, Thurs, and Sat.    Goal of Therapy:  INR 2-3  Plan:  Continue home dosage of warfarin Daily protimes CBC in am  [name] [name] 08/08/2014,10:15 AM

## 2014-08-09 ENCOUNTER — Inpatient Hospital Stay (HOSPITAL_COMMUNITY): Payer: Medicare Other | Admitting: Certified Registered Nurse Anesthetist

## 2014-08-09 ENCOUNTER — Encounter (HOSPITAL_COMMUNITY)
Admission: AD | Disposition: A | Discharge status: Home / Self Care | Payer: Medicare Other | Source: Ambulatory Visit | Attending: Internal Medicine

## 2014-08-09 HISTORY — PX: CARDIOVERSION: SHX1299

## 2014-08-09 LAB — BASIC METABOLIC PANEL
Anion gap: 7 (ref 5–15)
BUN: 27 mg/dL — ABNORMAL HIGH (ref 6–23)
CO2: 27 mmol/L (ref 19–32)
CREATININE: 1.23 mg/dL — AB (ref 0.50–1.10)
Calcium: 8.7 mg/dL (ref 8.4–10.5)
Chloride: 110 mmol/L (ref 96–112)
GFR calc Af Amer: 47 mL/min — ABNORMAL LOW (ref >90)
GFR calc non Af Amer: 40 mL/min — ABNORMAL LOW (ref >90)
Glucose, Bld: 107 mg/dL — ABNORMAL HIGH (ref 70–99)
Potassium: 4.5 mmol/L (ref 3.5–5.1)
SODIUM: 144 mmol/L (ref 135–145)

## 2014-08-09 LAB — CBC
HEMATOCRIT: 35 % — AB (ref 36.0–46.0)
HEMOGLOBIN: 10.9 g/dL — AB (ref 12.0–15.0)
MCH: 28 pg (ref 26.0–34.0)
MCHC: 31.1 g/dL (ref 30.0–36.0)
MCV: 90 fL (ref 78.0–100.0)
Platelets: 167 10*3/uL (ref 150–400)
RBC: 3.89 MIL/uL (ref 3.87–5.11)
RDW: 15.2 % (ref 11.5–15.5)
WBC: 6.9 10*3/uL (ref 4.0–10.5)

## 2014-08-09 LAB — PROTIME-INR
INR: 2.4 — ABNORMAL HIGH (ref 0.00–1.49)
Prothrombin Time: 26.4 seconds — ABNORMAL HIGH (ref 11.6–15.2)

## 2014-08-09 LAB — MAGNESIUM: Magnesium: 2.2 mg/dL (ref 1.5–2.5)

## 2014-08-09 SURGERY — CARDIOVERSION
Anesthesia: General

## 2014-08-09 MED ORDER — PROPOFOL 10 MG/ML IV BOLUS
INTRAVENOUS | Status: AC
  Filled 2014-08-09: qty 20

## 2014-08-09 MED ORDER — LIDOCAINE HCL (CARDIAC) 20 MG/ML IV SOLN
INTRAVENOUS | Status: DC | PRN
  Administered 2014-08-09: 60 mg via INTRAVENOUS

## 2014-08-09 MED ORDER — PROPOFOL 10 MG/ML IV BOLUS
INTRAVENOUS | Status: DC | PRN
  Administered 2014-08-09: 80 mg via INTRAVENOUS

## 2014-08-09 NOTE — Progress Notes (Signed)
ELECTROPHYSIOLOGY ROUNDING NOTE    SUBJECTIVE: The patient is doing well today.  Shortness of breath slightly improved.  No chest pain  CURRENT MEDICATIONS: . acetaminophen  500 mg Oral Daily  . amLODipine  5 mg Oral Daily  . atorvastatin  20 mg Oral QHS  . dofetilide  125 mcg Oral BID  . folic acid  1 mg Oral BID  . furosemide  40 mg Oral Daily  . losartan  100 mg Oral Daily  . metoprolol  100 mg Oral q morning - 10a  . potassium chloride SA  20 mEq Oral TID  . sodium chloride  3 mL Intravenous Q12H  . warfarin  2.5 mg Oral Once per day on Sun Mon Wed Fri  . warfarin  3.75 mg Oral Once per day on Tue Thu Sat  . Warfarin - Pharmacist Dosing Inpatient   Does not apply q1800      OBJECTIVE: Telemetry reveals atrial fibrillation with ventricular pacing  Physical Exam: Filed Vitals:   08/08/14 1441 08/08/14 2057 08/08/14 2128 08/09/14 0558  BP: 112/49 119/59  117/58  Pulse: 60 63  64  Temp: 98.3 F (36.8 C)  99.1 F (37.3 C) 98.5 F (36.9 C)  TempSrc: Oral  Oral Oral  Resp: 20 18  17   Height:      Weight:    168 lb 10.4 oz (76.5 kg)  SpO2: 96% 96%  94%    Intake/Output Summary (Last 24 hours) at 08/09/14 0920 Last data filed at 08/09/14 0559  Gross per 24 hour  Intake   1020 ml  Output    550 ml  Net    470 ml    GEN- The patient is well appearing, alert and oriented x 3 today.   HEENT: normocephalic, atraumatic; sclera clear, conjunctiva pink; hearing intact; oropharynx clear; neck supple, no JVP Lymph- no cervical lymphadenopathy Lungs- Slightly increased WOB, crackles at the bases Heart- Regular rate and rhythm (paced) GI- soft, non-tender, non-distended, bowel sounds present  Extremities- no clubbing, cyanosis, or edema; DP/PT/radial pulses 1+ bilaterally MS- no significant deformity or atrophy Skin- warm and dry, no rash or lesion Neuro- strength and sensation are intact   LABS: Basic Metabolic Panel:  Recent Labs  08/08/14 0344 08/09/14 0610    NA 144 144  K 3.9 4.5  CL 107 110  CO2 26 27  GLUCOSE 122* 107*  BUN 30* 27*  CREATININE 1.61* 1.23*  CALCIUM 9.1 8.7  MG 2.1 2.2    RADIOLOGY: Ct Chest W Contrast 07/24/2014   CLINICAL DATA:  Short of breath.  Onset of symptoms 05/21/2014.  EXAM: CT CHEST WITH CONTRAST  TECHNIQUE: Multidetector CT imaging of the chest was performed during intravenous contrast administration.  CONTRAST:  68mL OMNIPAQUE IOHEXOL 300 MG/ML SOLN reduced dose administered secondary to renal insufficiency.  COMPARISON:  Chest radiographs dating back to 2014.  FINDINGS: Musculoskeletal: Degenerative disc disease is present. No aggressive osseous lesions. Thoracic vertebral body height is preserved. Median sternotomy.  Lungs: Dependent atelectasis is present. There is no airspace consolidation. LEFT upper lobe scarring is present extending into the lingula. This is linear and has a typical appearance for scarring. Ground-glass attenuation nodule is present in the RIGHT middle lobe adjacent to the RIGHT mediastinal border. This probably represents scarring however follow-up is required. This solitary ground-glass attenuation pulmonary nodule measures 6 mm x 7 mm.  On reconstructed images, there is flattening of the hemidiaphragms consistent with hyperinflation and emphysema.  Central airways: Patent.  Vasculature: CABG. Aortic arch atherosclerosis and tortuosity. No acute vascular abnormality. The heart appears mildly enlarged. Aortic diameter is within normal limits.  Effusions: Small bilateral, LEFT-greater-than-RIGHT.  Lymphadenopathy: No axillary adenopathy. No mediastinal or hilar adenopathy.  Esophagus: Small hiatal hernia and patulous gastroesophageal junction.  Upper abdomen: Small hyper attenuating lesion in the LEFT hepatic dome statistically likely represents a flash fill hemangioma. This is best seen on the coronal images.  Other: LEFT subclavian pacemaker.  IMPRESSION: 1. Small bilateral LEFT-greater-than- RIGHT  pleural effusions. 2. Solitary ground-glass attenuation RIGHT middle lobe pulmonary nodule. Initial follow-up by chest CT without contrast is recommended in 3 months to confirm persistence. This recommendation follows the consensus statement: Recommendations for the Management of Subsolid Pulmonary Nodules Detected at CT: A Statement from the [place] as published in Radiology 2013; 266:304-317. 3. CABG and atherosclerosis. 4. Hyperinflation of the chest, suggesting emphysema.   Electronically Signed   By: Dereck Ligas M.D.   On: 07/24/2014 11:39    ASSESSMENT AND PLAN:  Active Problems:   Atrial fibrillation  1.  Persistent atrial fibrillation Admitted 08-07-14 for Tikosyn initiation CrCl 33.5 - on 150mcg twice daily QTc, K, Mg stable DCCV this morning Continue Warfarin for CHADS2VASC score of 5  2.  Chronic diastolic heart failure Shortness of breath improved this morning.   3.  CAD No recent ischemic symptoms  4.  HTN Stable No change required today   Chanetta Marshall, NP 08/09/2014 9:20 AM    EP Attending  Patient seen and examined. Agree with above history, physical exam, assessment and plan. [place] with DCCV. Will follow PPM and QT. Expect DC home tomorrow if stable.   Mikle Bosworth.D.

## 2014-08-09 NOTE — Op Note (Signed)
Procedure: Electrical Cardioversion Indications:  Atrial Fibrillation  Procedure Details:  Consent: Risks of procedure as well as the alternatives and risks of each were explained to the (patient/caregiver).  Consent for procedure obtained.  Time Out: Verified patient identification, verified procedure, site/side was marked, verified correct patient position, special equipment/implants available, medications/allergies/relevent history reviewed, required imaging and test results available.  Performed  Patient placed on cardiac monitor, pulse oximetry, supplemental oxygen as necessary.  Sedation given: IV propofol, Dr. Ola Spurr Pacer pads placed anterior and posterior chest.  Cardioverted 1 time(s).  Cardioverted synchronized biphasic 120J.  Evaluation: Findings: Post procedure ICD check shows: A-paced, V-paced rhythm with frequent PACs and normal device function. Lower rate limit increased to 80 bpm to reduce PAC burden temporarily. Complications: None Patient did tolerate procedure well.  Time Spent Directly with the Patient:  30 minutes   Lynnell Fiumara 08/09/2014, 10:02 AM

## 2014-08-09 NOTE — Interval H&P Note (Signed)
History and Physical Interval Note:  08/09/2014 9:46 AM  [name] [name]  has presented today for surgery, with the diagnosis of afib  The various methods of treatment have been discussed with the patient and family. After consideration of risks, benefits and other options for treatment, the patient has consented to  Procedure(s): CARDIOVERSION (N/A) as a surgical intervention .  The patient's history has been reviewed, patient examined, no change in status, stable for surgery.  I have reviewed the patient's chart and labs.  Questions were answered to the patient's satisfaction.     Bona Hubbard

## 2014-08-09 NOTE — Plan of Care (Signed)
Heart Care note:  RN called to asking whether to give dofetilide or not as QTC on ECG read 518 ms.  Reviewing current ECG, QT/QTC 444/518 ms with HR of 82 bpm.  This is in contrast with most of the previous ECG that have a QT/QTc of ~ 470/480 ms with HR 62.  I believe the QT has not changed significantly and the reason the QTc is longer is because of the HR being faster. Furthermore, the pt is V-paced with a QRS of 152 ms on most recent ECG.  Will plan on giving tonight's dose.   Yareliz Thorstenson 08/09/2014 9:55 PM

## 2014-08-09 NOTE — Anesthesia Postprocedure Evaluation (Signed)
  Anesthesia Post-op Note  Patient: [name] [name]  Procedure(s) Performed: Procedure(s): CARDIOVERSION (N/A)  Patient Location: PACU  Anesthesia Type:General  Level of Consciousness: awake and alert   Airway and Oxygen Therapy: Patient Spontanous Breathing  Post-op Pain: none  Post-op Assessment: Post-op Vital signs reviewed  Post-op Vital Signs: Reviewed  Last Vitals:  Filed Vitals:   08/09/14 1010  BP:   Pulse: 79  Temp:   Resp: 26    Complications: No apparent anesthesia complications

## 2014-08-09 NOTE — Transfer of Care (Signed)
Immediate Anesthesia Transfer of Care Note  Patient: [name] [name]  Procedure(s) Performed: Procedure(s): CARDIOVERSION (N/A)  Patient Location: Endoscopy Unit  Anesthesia Type:General  Level of Consciousness: awake, alert , oriented and patient cooperative  Airway & Oxygen Therapy: Patient Spontanous Breathing and Patient connected to nasal cannula oxygen  Post-op Assessment: Report given to RN, Post -op Vital signs reviewed and stable, Patient moving all extremities, Patient moving all extremities X 4 and Patient able to stick tongue midline  Post vital signs: Reviewed and stable  Last Vitals:  Filed Vitals:   08/09/14 1005  BP: 110/46  Pulse: 80  Temp:   Resp: 24    Complications: No apparent anesthesia complications

## 2014-08-09 NOTE — Progress Notes (Signed)
[place] for warfarin Indication: atrial fibrillation  Allergies  Allergen Reactions  . Codeine Nausea And Vomiting  . Keflex [Cephalexin] Itching and Rash    Patient Measurements: Height: 5\' 1"  (154.9 cm) Weight: 168 lb 10.4 oz (76.5 kg) IBW/kg (Calculated) : 47.8  Vital Signs: Temp: 97.9 F (36.6 C) (04/21 0936) Temp Source: Oral (04/21 0936) BP: 152/70 mmHg (04/21 0945) Pulse Rate: 59 (04/21 0955)  Labs:  Recent Labs  08/07/14 1037 08/07/14 1101 08/08/14 0344 08/09/14 0610  HGB  --   --   --  10.9*  HCT  --   --   --  35.0*  PLT  --   --   --  167  LABPROT  --   --  25.8* 26.4*  INR  --  2.5 2.33* 2.40*  CREATININE 1.52*  --  1.61* 1.23*   CMP Latest Ref Rng 08/09/2014 08/08/2014 08/07/2014  Glucose 70 - 99 mg/dL 107(H) 122(H) 89  BUN 6 - 23 mg/dL 27(H) 30(H) 29(H)  Creatinine 0.50 - 1.10 mg/dL 1.23(H) 1.61(H) 1.52(H)  Sodium 135 - 145 mmol/L 144 144 142  Potassium 3.5 - 5.1 mmol/L 4.5 3.9 4.3  Chloride 96 - 112 mmol/L 110 107 104  CO2 19 - 32 mmol/L 27 26 33(H)  Calcium 8.4 - 10.5 mg/dL 8.7 9.1 10.0  Total Protein 6.0 - 8.3 g/dL - - -  Total Bilirubin 0.3 - 1.2 mg/dL - - -  Alkaline Phos 39 - 117 U/L - - -  AST 0 - 37 U/L - - -  ALT 0 - 35 U/L - - -   Mg 2.1  Estimated Creatinine Clearance: 34.1 mL/min (by C-G formula based on Cr of 1.23).   Medical History: Past Medical History  Diagnosis Date  . Osteoarthritis   . Mixed hyperlipidemia   . Coronary atherosclerosis of native coronary artery     Multivessel status post CABG, DES PLA March 2006  . Essential hypertension, benign   . Complete heart block   . Ventricular fibrillation 2003    a. seen on PPM interrogation a/w syncope  . Atrial fibrillation     a. anticoagulated with Warfarin  . Chronic diastolic heart failure   . Shortness of breath dyspnea     Medications:  Prescriptions prior to admission  Medication Sig Dispense Refill Last Dose  .  acetaminophen (TYLENOL) 500 MG tablet Take 500 mg by mouth daily.    08/06/2014 at Unknown time  . amLODipine (NORVASC) 5 MG tablet TAKE ONE TABLET BY MOUTH ONCE DAILY. 30 tablet 6 08/07/2014 at Unknown time  . aspirin EC 81 MG tablet Take 81 mg by mouth every morning.    08/07/2014 at Unknown time  . atorvastatin (LIPITOR) 20 MG tablet Take 1 tablet (20 mg total) by mouth at bedtime. 90 tablet 3 08/06/2014 at Unknown time  . Calcium Carbonate-Vitamin D (CALCIUM 600 + D PO) Take 1 tablet by mouth 2 (two) times daily.   08/07/2014 at Unknown time  . Cholecalciferol (VITAMIN D) 2000 UNITS CAPS Take 1 capsule by mouth at bedtime.    08/06/2014 at Unknown time  . folic acid (FOLVITE) 1 MG tablet Take 1 mg by mouth 2 (two) times daily.    08/07/2014 at Unknown time  . furosemide (LASIX) 20 MG tablet Take one tablet by mouth at noon   08/06/2014 at Unknown time  . furosemide (LASIX) 40 MG tablet Take one tablet by mouth every morning  08/07/2014 at Unknown time  . losartan-hydrochlorothiazide (HYZAAR) 100-12.5 MG per tablet Take by mouth daily.   08/06/2014 at Unknown time  . metoprolol (TOPROL-XL) 200 MG 24 hr tablet Take 200 mg by mouth every morning.    08/07/2014 at 0800  . Polyethyl Glycol-Propyl Glycol (SYSTANE OP) Place 1 drop into both eyes daily as needed (Dry eyes).   Past Week at Unknown time  . potassium chloride SA (K-DUR,KLOR-CON) 20 MEQ tablet Take 20 mEq by mouth 3 (three) times daily.   08/07/2014 at Unknown time  . warfarin (COUMADIN) 2.5 MG tablet Take 1 tablet daily except 1 1/2 tablets on Tuesdays, Thursdays and Saturdays 48 tablet 6 08/06/2014 at Unknown time  . albuterol (PROVENTIL HFA;VENTOLIN HFA) 108 (90 BASE) MCG/ACT inhaler Inhale 2 puffs into the lungs every 6 (six) hours as needed for wheezing or shortness of breath. (Patient not taking: Reported on 08/07/2014) 1 Inhaler 2 Not Taking at Unknown time  . losartan (COZAAR) 100 MG tablet Take 1 tablet (100 mg total) by mouth daily. (Patient not  taking: Reported on 08/07/2014) 90 tablet 1 Not Taking at Unknown time    Assessment: 81 year old woman on chronic warfarin for AFIB admitted to the hospital 4/19 for Tikosyn initiation.  Mg and K remain in acceptable range.   INR remains therapeutic on 2.5mg  daily except 3.75mg  on Tues, Thurs, and Sat.    Goal of Therapy:  INR 2-3  Plan:  Continue home dosage of warfarin Daily protimes  Sheng Pritz Poteet 08/09/2014,9:57 AM

## 2014-08-09 NOTE — Anesthesia Preprocedure Evaluation (Signed)
Anesthesia Evaluation  Patient identified by MRN, date of birth, ID band Patient awake    Reviewed: Allergy & Precautions, NPO status , Patient's Chart, lab work & pertinent test results  Airway Mallampati: III  TM Distance: >3 FB Neck ROM: Full    Dental  (+) Teeth Intact   Pulmonary shortness of breath,  breath sounds clear to auscultation        Cardiovascular hypertension, + CAD + dysrhythmias + pacemaker Rhythm:Regular Rate:Normal     Neuro/Psych negative neurological ROS     GI/Hepatic negative GI ROS, Neg liver ROS,   Endo/Other  Morbid obesity  Renal/GU negative Renal ROS     Musculoskeletal  (+) Arthritis -,   Abdominal   Peds  Hematology negative hematology ROS (+)   Anesthesia Other Findings   Reproductive/Obstetrics                             Anesthesia Physical Anesthesia Plan  ASA: III  Anesthesia Plan: General   Post-op Pain Management:    Induction: Intravenous  Airway Management Planned: Mask  Additional Equipment:   Intra-op Plan:   Post-operative Plan:   Informed Consent: I have reviewed the patients History and Physical, chart, labs and discussed the procedure including the risks, benefits and alternatives for the proposed anesthesia with the patient or authorized representative who has indicated his/her understanding and acceptance.     Plan Discussed with: CRNA  Anesthesia Plan Comments:         Anesthesia Quick Evaluation

## 2014-08-10 ENCOUNTER — Encounter (HOSPITAL_COMMUNITY): Payer: Self-pay | Admitting: Cardiovascular Disease

## 2014-08-10 LAB — BASIC METABOLIC PANEL
Anion gap: 9 (ref 5–15)
BUN: 22 mg/dL (ref 6–23)
CO2: 24 mmol/L (ref 19–32)
Calcium: 8.4 mg/dL (ref 8.4–10.5)
Chloride: 111 mmol/L (ref 96–112)
Creatinine, Ser: 1.1 mg/dL (ref 0.50–1.10)
GFR calc non Af Amer: 46 mL/min — ABNORMAL LOW (ref >90)
GFR, EST AFRICAN AMERICAN: 53 mL/min — AB (ref >90)
Glucose, Bld: 106 mg/dL — ABNORMAL HIGH (ref 70–99)
Potassium: 4.6 mmol/L (ref 3.5–5.1)
Sodium: 144 mmol/L (ref 135–145)

## 2014-08-10 LAB — PROTIME-INR
INR: 2.72 — ABNORMAL HIGH (ref 0.00–1.49)
Prothrombin Time: 29 seconds — ABNORMAL HIGH (ref 11.6–15.2)

## 2014-08-10 LAB — MAGNESIUM: Magnesium: 2.2 mg/dL (ref 1.5–2.5)

## 2014-08-10 MED ORDER — DOFETILIDE 125 MCG PO CAPS: 125.0000 ug | ORAL_CAPSULE | Freq: Two times a day (BID) | ORAL | Status: DC

## 2014-08-10 MED ORDER — METOPROLOL SUCCINATE ER 100 MG PO TB24: 100.0000 mg | ORAL_TABLET | Freq: Every morning | ORAL | Status: DC

## 2014-08-10 NOTE — Progress Notes (Signed)
Chaplain met with patient.  She reports feeling pretty well, and hoping she will be able to go home tomorrow.  She is hoping her heart rhythm will soon be regulated.  We had a pleasant discussion about her church (she is a [employer]) and her family.  Chaplain shared a prayer. Clent Demark 08/10/2014 12:03 PM

## 2014-08-10 NOTE — Progress Notes (Signed)
Patient is being discharged home. Pt has been provided with discharge instructions. RN answered all questions the patient had. Patient is being transported home by her daughter who is at the bedside

## 2014-08-10 NOTE — Discharge Summary (Signed)
ELECTROPHYSIOLOGY PROCEDURE DISCHARGE SUMMARY    Patient ID: [name] [name],  MRN: [id number], DOB/AGE: Nov 22, 1931 81 y.o.  Admit date: 08/07/2014 Discharge date: 08/10/2014  Primary Care Physician: Alonza Bogus, MD Primary Cardiologist: Domenic Polite Electrophysiologist: Lovena Le  Primary Discharge Diagnosis:  1.  Persistent atrial fibrillation status post Tikosyn loading this admission  Secondary Discharge Diagnosis:  1.  Complete heart block 2.  Prior VF arrest 3.  Diastolic heart failure 4.  Hypertension 5.  Hyperlipidemia 6.  CAD s/p CABG  Allergies  Allergen Reactions  . Codeine Nausea And Vomiting  . Keflex [Cephalexin] Itching and Rash     Procedures This Admission:  1.  Tikosyn loading 2.  Direct current cardioversion on 08/09/14 by Dr Sallyanne Kuster which successfully restored SR.  There were no early apparent complications.   Brief HPI: [name] [name] is a 81 y.o. female with a past medical history as noted above.  They were referred to EP in the outpatient setting for treatment options of atrial fibrillation.  Risks, benefits, and alternatives to Tikosyn were reviewed with the patient who wished to proceed.    Hospital Course:  The patient was admitted and Tikosyn was initiated.  Renal function and electrolytes were followed during the hospitalization.  Their QTc remained stable.  On 08/09/14 they underwent direct current cardioversion which restored sinus rhythm.  They were monitored until discharge on telemetry which demonstrated AV pacing.  On the day of discharge, they were examined by Dr Lovena Le who considered them stable for discharge to home.  Follow-up has been arranged with [facility] pharmacists in 1 week and with Dr Lovena Le in 4 weeks.   Physical Exam: Filed Vitals:   08/09/14 1500 08/09/14 2051 08/10/14 0634 08/10/14 0851  BP: 133/59 131/61 157/71 123/58  Pulse: 83 84 80 81  Temp: 97.6 F (36.4 C) 98.9 F (37.2 C) 98.6 F (37 C)   TempSrc: Oral Oral Oral     Resp: 18 22 22 23   Height:      Weight:   167 lb 12.8 oz (76.114 kg)   SpO2: 98% 94% 95% 97%    GEN- The patient is well appearing, alert and oriented x 3 today.   HEENT: normocephalic, atraumatic; sclera clear, conjunctiva pink; hearing intact; oropharynx clear; neck supple, no JVP Lymph- no cervical lymphadenopathy Lungs- Clear to ausculation bilaterally, normal work of breathing.  No wheezes, rales, rhonchi Heart- Regular rate and rhythm, no murmurs, rubs or gallops  GI- soft, non-tender, non-distended, bowel sounds present  Extremities- no clubbing, cyanosis, or edema; DP/PT/radial pulses 1+ bilaterally MS- no significant deformity or atrophy Skin- warm and dry, no rash or lesion Psych- euthymic mood, full affect Neuro- strength and sensation are intact   Labs:   Lab Results  Component Value Date   WBC 6.9 08/09/2014   HGB 10.9* 08/09/2014   HCT 35.0* 08/09/2014   MCV 90.0 08/09/2014   PLT 167 08/09/2014     Recent Labs Lab 08/10/14 0540  NA 144  K 4.6  CL 111  CO2 24  BUN 22  CREATININE 1.10  CALCIUM 8.4  GLUCOSE 106*     Discharge Medications:    Medication List    STOP taking these medications        losartan-hydrochlorothiazide 100-12.5 MG per tablet  Commonly known as:  HYZAAR      TAKE these medications        acetaminophen 500 MG tablet  Commonly known as:  TYLENOL  Take 500 mg  by mouth daily.     albuterol 108 (90 BASE) MCG/ACT inhaler  Commonly known as:  PROVENTIL HFA;VENTOLIN HFA  Inhale 2 puffs into the lungs every 6 (six) hours as needed for wheezing or shortness of breath.     amLODipine 5 MG tablet  Commonly known as:  NORVASC  TAKE ONE TABLET BY MOUTH ONCE DAILY.     aspirin EC 81 MG tablet  Take 81 mg by mouth every morning.     atorvastatin 20 MG tablet  Commonly known as:  LIPITOR  Take 1 tablet (20 mg total) by mouth at bedtime.     CALCIUM 600 + D PO  Take 1 tablet by mouth 2 (two) times daily.     dofetilide 125  MCG capsule  Commonly known as:  TIKOSYN  Take 1 capsule (125 mcg total) by mouth 2 (two) times daily.     folic acid 1 MG tablet  Commonly known as:  FOLVITE  Take 1 mg by mouth 2 (two) times daily.     furosemide 40 MG tablet  Commonly known as:  LASIX  Take one tablet by mouth every morning     losartan 100 MG tablet  Commonly known as:  COZAAR  Take 1 tablet (100 mg total) by mouth daily.     metoprolol succinate 100 MG 24 hr tablet  Commonly known as:  TOPROL-XL  Take 1 tablet (100 mg total) by mouth every morning. Take with or immediately following a meal.     potassium chloride SA 20 MEQ tablet  Commonly known as:  K-DUR,KLOR-CON  Take 20 mEq by mouth 3 (three) times daily.     SYSTANE OP  Place 1 drop into both eyes daily as needed (Dry eyes).     Vitamin D 2000 UNITS Caps  Take 1 capsule by mouth at bedtime.     warfarin 2.5 MG tablet  Commonly known as:  COUMADIN  Take 1 tablet daily except 1 1/2 tablets on Tuesdays, Thursdays and Saturdays        Disposition:  Discharge Instructions    Diet - low sodium heart healthy    Complete by:  As directed      Increase activity slowly    Complete by:  As directed           Follow-up Information    Follow up with [facility] On 08/15/2014.   Specialty:  Cardiology   Why:  at 10:50 for Coumadin check   Contact information:   [place] [phone]      Follow up with [facility] On 08/17/2014.   Why:  at [facility] for pharmacist visit   Contact information:   [place] [phone]       Follow up with [name], MD On 09/07/2014.   Specialty:  Cardiology   Why:  at 8:45AM   Contact information:   [place]  [id number] [phone]       Duration of Discharge Encounter: Greater than 30 minutes including physician time.  Signed, Chanetta Marshall, NP 08/10/2014 9:08 AM  EP Attending  Patient  seen and examined. Agree with the history, exam, and findings as noted by Chanetta Marshall, NP. [place] for discharge home. We have reduced her dose of metoprolol.   Mikle Bosworth.D.

## 2014-08-15 ENCOUNTER — Ambulatory Visit (INDEPENDENT_AMBULATORY_CARE_PROVIDER_SITE_OTHER): Payer: Medicare Other | Admitting: *Deleted

## 2014-08-15 DIAGNOSIS — I482 Chronic atrial fibrillation, unspecified: Secondary | ICD-10-CM

## 2014-08-15 DIAGNOSIS — Z5181 Encounter for therapeutic drug level monitoring: Secondary | ICD-10-CM

## 2014-08-15 DIAGNOSIS — I480 Paroxysmal atrial fibrillation: Secondary | ICD-10-CM

## 2014-08-15 LAB — POCT INR: INR: 2

## 2014-08-17 ENCOUNTER — Ambulatory Visit (INDEPENDENT_AMBULATORY_CARE_PROVIDER_SITE_OTHER): Payer: Medicare Other | Admitting: Pharmacist

## 2014-08-17 DIAGNOSIS — I480 Paroxysmal atrial fibrillation: Secondary | ICD-10-CM

## 2014-08-17 LAB — BASIC METABOLIC PANEL
BUN: 27 mg/dL — AB (ref 6–23)
CALCIUM: 10.1 mg/dL (ref 8.4–10.5)
CO2: 29 mEq/L (ref 19–32)
CREATININE: 1.15 mg/dL (ref 0.40–1.20)
Chloride: 107 mEq/L (ref 96–112)
GFR: 48.19 mL/min — ABNORMAL LOW (ref >60.00)
Glucose, Bld: 98 mg/dL (ref 70–99)
POTASSIUM: 4.8 meq/L (ref 3.5–5.1)
Sodium: 142 mEq/L (ref 135–145)

## 2014-08-17 LAB — MAGNESIUM: Magnesium: 2.1 mg/dL (ref 1.5–2.5)

## 2014-08-17 NOTE — Progress Notes (Signed)
08/17/2014 [name] [name] [birth year] [id number]   HPI:  [name] [name] is a 81 y.o. female patient of Dr Lovena Le, who was admitted from 4/19-4/22 for Tikosyn initiation.  She was started on Tikosyn 135mcg BID.  Her QTc remained stable throughout hospitalization.  Pt did require DCCV on 4/21.  This restored NSR and she was in normal rhythm at discharge.  QTc at discharge was 501 msec.  Pt does have a defibrillator as well  Pt states she is doing well since discharge.  She does seem to be less SOB but hasn't notice a big change in her energy level yet. She is concerned that she has developed and increase in lower extremity edema after having to stop her HCTZ. She was able to get her Rx from the pharmacy with no issues and has not missed any doses.  Reinforced Tikosyn education.   EKG reviewed by Dr. Lovena Le.  Ventricular paced rhythm.  Vent rate 70 bpm.  QTc 518 msec.     Current Outpatient Prescriptions  Medication Sig Dispense Refill  . acetaminophen (TYLENOL) 500 MG tablet Take 500 mg by mouth daily.     [name] albuterol (PROVENTIL HFA;VENTOLIN HFA) 108 (90 BASE) MCG/ACT inhaler Inhale 2 puffs into the lungs every 6 (six) hours as needed for wheezing or shortness of breath. (Patient not taking: Reported on 08/07/2014) 1 Inhaler 2  . amLODipine (NORVASC) 5 MG tablet TAKE ONE TABLET BY MOUTH ONCE DAILY. 30 tablet 6  . aspirin EC 81 MG tablet Take 81 mg by mouth every morning.     [name] atorvastatin (LIPITOR) 20 MG tablet Take 1 tablet (20 mg total) by mouth at bedtime. 90 tablet 3  . Calcium Carbonate-Vitamin D (CALCIUM 600 + D PO) Take 1 tablet by mouth 2 (two) times daily.    . Cholecalciferol (VITAMIN D) 2000 UNITS CAPS Take 1 capsule by mouth at bedtime.     . dofetilide (TIKOSYN) 125 MCG capsule Take 1 capsule (125 mcg total) by mouth 2 (two) times daily. 076 capsule 1  . folic acid (FOLVITE) 1 MG tablet Take 1 mg by mouth 2 (two) times daily.     . furosemide (LASIX) 40 MG tablet Take one tablet  by mouth every morning    . losartan (COZAAR) 100 MG tablet Take 1 tablet (100 mg total) by mouth daily. (Patient not taking: Reported on 08/07/2014) 90 tablet 1  . metoprolol succinate (TOPROL-XL) 100 MG 24 hr tablet Take 1 tablet (100 mg total) by mouth every morning. Take with or immediately following a meal. 30 tablet 1  . Polyethyl Glycol-Propyl Glycol (SYSTANE OP) Place 1 drop into both eyes daily as needed (Dry eyes).    . potassium chloride SA (K-DUR,KLOR-CON) 20 MEQ tablet Take 20 mEq by mouth 3 (three) times daily.    [name] warfarin (COUMADIN) 2.5 MG tablet Take 1 tablet daily except 1 1/2 tablets on Tuesdays, Thursdays and Saturdays 48 tablet 6   No current facility-administered medications for this visit.    Allergies  Allergen Reactions  . Codeine Nausea And Vomiting  . Keflex [Cephalexin] Itching and Rash    Assessment and Plan  1.  Atrial Fibrillation- Pt is feeling better since starting Tikosyn.  She still has some SOB but states this has improved.  She is maintaining NSR and her QTc is still acceptable given she has an ICD.  Will follow up lab results and SCr to see if she may be able to increase furosemide  as needed for the extra swelling related to d/c HCTZ.

## 2014-08-22 ENCOUNTER — Ambulatory Visit (INDEPENDENT_AMBULATORY_CARE_PROVIDER_SITE_OTHER): Payer: Medicare Other | Admitting: *Deleted

## 2014-08-22 DIAGNOSIS — I4891 Unspecified atrial fibrillation: Secondary | ICD-10-CM

## 2014-08-22 DIAGNOSIS — Z5181 Encounter for therapeutic drug level monitoring: Secondary | ICD-10-CM | POA: Diagnosis not present

## 2014-08-22 DIAGNOSIS — I482 Chronic atrial fibrillation, unspecified: Secondary | ICD-10-CM

## 2014-08-22 LAB — POCT INR: INR: 1.5

## 2014-08-29 ENCOUNTER — Ambulatory Visit (INDEPENDENT_AMBULATORY_CARE_PROVIDER_SITE_OTHER): Payer: Medicare Other | Admitting: *Deleted

## 2014-08-29 DIAGNOSIS — Z5181 Encounter for therapeutic drug level monitoring: Secondary | ICD-10-CM | POA: Diagnosis not present

## 2014-08-29 DIAGNOSIS — I482 Chronic atrial fibrillation, unspecified: Secondary | ICD-10-CM

## 2014-08-29 DIAGNOSIS — I4891 Unspecified atrial fibrillation: Secondary | ICD-10-CM | POA: Diagnosis not present

## 2014-08-29 LAB — POCT INR: INR: 2

## 2014-09-05 ENCOUNTER — Ambulatory Visit (INDEPENDENT_AMBULATORY_CARE_PROVIDER_SITE_OTHER): Payer: Medicare Other | Admitting: *Deleted

## 2014-09-05 DIAGNOSIS — I4891 Unspecified atrial fibrillation: Secondary | ICD-10-CM | POA: Diagnosis not present

## 2014-09-05 DIAGNOSIS — I482 Chronic atrial fibrillation, unspecified: Secondary | ICD-10-CM

## 2014-09-05 DIAGNOSIS — Z5181 Encounter for therapeutic drug level monitoring: Secondary | ICD-10-CM | POA: Diagnosis not present

## 2014-09-05 LAB — POCT INR: INR: 2.1

## 2014-09-07 ENCOUNTER — Encounter: Payer: Self-pay | Admitting: Internal Medicine

## 2014-09-07 ENCOUNTER — Ambulatory Visit (INDEPENDENT_AMBULATORY_CARE_PROVIDER_SITE_OTHER): Payer: Medicare Other | Admitting: Internal Medicine

## 2014-09-07 VITALS — BP 140/70 | HR 74 | Ht 61.0 in | Wt 167.4 lb

## 2014-09-07 DIAGNOSIS — I480 Paroxysmal atrial fibrillation: Secondary | ICD-10-CM | POA: Diagnosis not present

## 2014-09-07 DIAGNOSIS — Z95810 Presence of automatic (implantable) cardiac defibrillator: Secondary | ICD-10-CM

## 2014-09-07 DIAGNOSIS — I4729 Other ventricular tachycardia: Secondary | ICD-10-CM

## 2014-09-07 DIAGNOSIS — I5032 Chronic diastolic (congestive) heart failure: Secondary | ICD-10-CM

## 2014-09-07 DIAGNOSIS — I472 Ventricular tachycardia: Secondary | ICD-10-CM

## 2014-09-07 LAB — CUP PACEART INCLINIC DEVICE CHECK
Battery Remaining Longevity: 89 mo
Battery Voltage: 3 V
Brady Statistic AS VP Percent: 2.87 %
Brady Statistic RA Percent Paced: 97.13 %
HIGH POWER IMPEDANCE MEASURED VALUE: 19 Ohm
HighPow Impedance: 50 Ohm
HighPow Impedance: 74 Ohm
Lead Channel Impedance Value: 456 Ohm
Lead Channel Pacing Threshold Amplitude: 0.75 V
Lead Channel Pacing Threshold Amplitude: 0.75 V
Lead Channel Pacing Threshold Pulse Width: 0.4 ms
Lead Channel Pacing Threshold Pulse Width: 0.4 ms
Lead Channel Sensing Intrinsic Amplitude: 2.25 mV
Lead Channel Setting Pacing Amplitude: 2 V
Lead Channel Setting Pacing Amplitude: 2.5 V
Lead Channel Setting Pacing Pulse Width: 0.4 ms
Lead Channel Setting Sensing Sensitivity: 0.3 mV
MDC IDC MSMT LEADCHNL RV IMPEDANCE VALUE: 361 Ohm
MDC IDC MSMT LEADCHNL RV SENSING INTR AMPL: 10.625 mV
MDC IDC SESS DTM: 20160520093339
MDC IDC SET ZONE DETECTION INTERVAL: 300 ms
MDC IDC SET ZONE DETECTION INTERVAL: 350 ms
MDC IDC STAT BRADY AP VP PERCENT: 97.08 %
MDC IDC STAT BRADY AP VS PERCENT: 0.04 %
MDC IDC STAT BRADY AS VS PERCENT: 0 %
MDC IDC STAT BRADY RV PERCENT PACED: 99.95 %
Zone Setting Detection Interval: 350 ms
Zone Setting Detection Interval: 450 ms

## 2014-09-07 MED ORDER — WARFARIN SODIUM 2.5 MG PO TABS: ORAL_TABLET | ORAL | Status: DC

## 2014-09-07 NOTE — Progress Notes (Signed)
HPI [name] [name] returns today for followup. She is a pleasant 81 yo woman with a h/o VF, CHB, s/p ICD implant who underwent ICD generator change several months ago. She developed worsening CHF and this coincided with the development of atrial fib. She was hospitalized and underwent Tikosyn initiation several weeks ago. She returns today for followup. Overall she is better but not yet back to baseline.  Allergies  Allergen Reactions  . Codeine Nausea And Vomiting  . Keflex [Cephalexin] Itching and Rash     Current Outpatient Prescriptions  Medication Sig Dispense Refill  . acetaminophen (TYLENOL) 500 MG tablet Take 500 mg by mouth daily.     [name] albuterol (PROVENTIL HFA;VENTOLIN HFA) 108 (90 BASE) MCG/ACT inhaler Inhale 2 puffs into the lungs every 6 (six) hours as needed for wheezing or shortness of breath. 1 Inhaler 2  . amLODipine (NORVASC) 5 MG tablet TAKE ONE TABLET BY MOUTH ONCE DAILY. 30 tablet 6  . aspirin EC 81 MG tablet Take 81 mg by mouth every morning.     [name] atorvastatin (LIPITOR) 20 MG tablet Take 1 tablet (20 mg total) by mouth at bedtime. 90 tablet 3  . Calcium Carbonate-Vitamin D (CALCIUM 600 + D PO) Take 1 tablet by mouth 2 (two) times daily.    . Cholecalciferol (VITAMIN D) 2000 UNITS CAPS Take 1 capsule by mouth at bedtime.     . dofetilide (TIKOSYN) 125 MCG capsule Take 1 capsule (125 mcg total) by mouth 2 (two) times daily. 099 capsule 1  . folic acid (FOLVITE) 1 MG tablet Take 1 mg by mouth 2 (two) times daily.     . furosemide (LASIX) 40 MG tablet Take one tablet by mouth every morning    . losartan (COZAAR) 100 MG tablet Take 1 tablet (100 mg total) by mouth daily. 90 tablet 1  . metoprolol succinate (TOPROL-XL) 100 MG 24 hr tablet Take 1 tablet (100 mg total) by mouth every morning. Take with or immediately following a meal. 30 tablet 1  . Polyethyl Glycol-Propyl Glycol (SYSTANE OP) Place 1 drop into both eyes daily as needed (Dry eyes).    . potassium chloride  SA (K-DUR,KLOR-CON) 20 MEQ tablet Take 20 mEq by mouth 3 (three) times daily.    [name] warfarin (COUMADIN) 2.5 MG tablet Take 1 tablet daily except 1 1/2 tablets on Tuesdays, Thursdays and Saturdays 48 tablet 6   No current facility-administered medications for this visit.     Past Medical History  Diagnosis Date  . Osteoarthritis   . Mixed hyperlipidemia   . Coronary atherosclerosis of native coronary artery     Multivessel status post CABG, DES PLA March 2006  . Essential hypertension, benign   . Complete heart block   . Ventricular fibrillation 2003    a. seen on PPM interrogation a/w syncope  . Atrial fibrillation     a. anticoagulated with Warfarin  . Chronic diastolic heart failure     ROS:   All systems reviewed and negative except as noted in the HPI.   Past Surgical History  Procedure Laterality Date  . Coronary artery bypass graft      LIMA to LAD, SVG to diagonal, SVG to ramus and OM  . Cardiac defibrillator placement      MDT dual chamber ICD  . Yag laser application Left 11/21/3823    Procedure: YAG LASER APPLICATION;  Surgeon: Elta Guadeloupe T. Gershon Crane, MD;  Location: [facility];  Service: Ophthalmology;  Laterality: Left;  .  Yag laser application Right 4/96/7591    Procedure: YAG LASER APPLICATION;  Surgeon: Elta Guadeloupe T. Gershon Crane, MD;  Location: [facility];  Service: Ophthalmology;  Laterality: Right;  . Implantable cardioverter defibrillator generator change N/A 09/25/2013    Procedure: IMPLANTABLE CARDIOVERTER DEFIBRILLATOR GENERATOR CHANGE;  Surgeon: Evans Lance, MD;  Location: [facility] CATH LAB;  Service: Cardiovascular;  Laterality: N/A;  . Lead revision N/A 09/29/2013    Procedure: LEAD REVISION;  Surgeon: Deboraha Sprang, MD;  Location: [facility] CATH LAB;  Service: Cardiovascular;  Laterality: N/A;  . Tonsillectomy    . Cardioversion N/A 08/09/2014    Procedure: CARDIOVERSION;  Surgeon: Sanda Klein, MD;  Location: [facility] ENDOSCOPY;  Service: Cardiovascular;  Laterality: N/A;     Family  History  Problem Relation Age of Onset  . Heart attack Father   . Heart attack Brother      History   Social History  . Marital Status: Widowed    Spouse Name: N/A  . Number of Children: 2  . Years of Education: N/A   Occupational History  . Retired     Equities trader  .     Social History Main Topics  . Smoking status: Never Smoker   . Smokeless tobacco: Never Used  . Alcohol Use: No  . Drug Use: No  . Sexual Activity: No   Other Topics Concern  . Not on file   Social History Narrative     BP 140/70 mmHg  Pulse 74  Ht 5\' 1"  (1.549 m)  Wt 167 lb 6.4 oz (75.932 kg)  BMI 31.65 kg/m2  SpO2 97%  Physical Exam:  stable appearing 81 yo woman,NAD HEENT: Unremarkable Neck:  7 cm JVD, no thyromegally Back:  No CVA tenderness Lungs:  Clear with no wheezes HEART:  Regular rate rhythm, no murmurs, no rubs, no clicks Abd:  soft, positive bowel sounds, no organomegally, no rebound, no guarding Ext:  2 plus pulses, no edema, no cyanosis, no clubbing Skin:  No rashes no nodules Neuro:  CN II through XII intact, motor grossly intact    DEVICE  Normal device function.  See PaceArt for details.   Assess/Plan:

## 2014-09-07 NOTE — Patient Instructions (Addendum)
Your physician recommends that you schedule a follow-up appointment in: 3 months with Dr Lovena Le     INCREASE Lasix take 1 extra tablet  Today,tomorrow, and Sunday.Then resume regular dose   No other medication changes   Remote monitoring is used to monitor your Pacemaker of ICD from home. This monitoring reduces the number of office visits required to check your device to one time per year. It allows [place] to keep an eye on the functioning of your device to ensure it is working properly. You are scheduled for a device check from home on 12/10/14. You may send your transmission at any time that day. If you have a wireless device, the transmission will be sent automatically. After your physician reviews your transmission, you will receive a postcard with your next transmission date.

## 2014-09-07 NOTE — Assessment & Plan Note (Signed)
Her symptoms are improved but still not back to baseline. I have asked her to take an extra lasix for 2 or 3 days then go back to 40 mg daily. I have encouraged her to reduce her sodium intake.

## 2014-09-07 NOTE — Assessment & Plan Note (Signed)
ICD interogation demonstrates that her medtronic device is working normally and she is maintaining NSR.

## 2014-09-07 NOTE — Addendum Note (Signed)
Addended by: Barbarann Ehlers A on: 09/07/2014 09:45 AM   Modules accepted: Orders

## 2014-09-07 NOTE — Assessment & Plan Note (Signed)
She has had no recurrent VT since her last visit. No change in meds.

## 2014-09-07 NOTE — Assessment & Plan Note (Signed)
She is maintaining NSR. She will contiue her current dose of Tikosyn.

## 2014-09-19 ENCOUNTER — Ambulatory Visit (INDEPENDENT_AMBULATORY_CARE_PROVIDER_SITE_OTHER): Payer: Medicare Other | Admitting: *Deleted

## 2014-09-19 DIAGNOSIS — Z5181 Encounter for therapeutic drug level monitoring: Secondary | ICD-10-CM | POA: Diagnosis not present

## 2014-09-19 DIAGNOSIS — I4891 Unspecified atrial fibrillation: Secondary | ICD-10-CM

## 2014-09-19 DIAGNOSIS — I482 Chronic atrial fibrillation, unspecified: Secondary | ICD-10-CM

## 2014-09-19 LAB — POCT INR: INR: 2.1

## 2014-09-24 ENCOUNTER — Ambulatory Visit (INDEPENDENT_AMBULATORY_CARE_PROVIDER_SITE_OTHER): Payer: Medicare Other | Admitting: Cardiology

## 2014-09-24 ENCOUNTER — Encounter: Payer: Self-pay | Admitting: Cardiology

## 2014-09-24 VITALS — BP 134/78 | HR 86 | Ht 61.0 in | Wt 168.0 lb

## 2014-09-24 DIAGNOSIS — I5032 Chronic diastolic (congestive) heart failure: Secondary | ICD-10-CM

## 2014-09-24 DIAGNOSIS — I480 Paroxysmal atrial fibrillation: Secondary | ICD-10-CM | POA: Diagnosis not present

## 2014-09-24 DIAGNOSIS — Z95810 Presence of automatic (implantable) cardiac defibrillator: Secondary | ICD-10-CM

## 2014-09-24 DIAGNOSIS — I2510 Atherosclerotic heart disease of native coronary artery without angina pectoris: Secondary | ICD-10-CM

## 2014-09-24 NOTE — Progress Notes (Signed)
Cardiology Office Note  Date: 09/24/2014   [name] [name], DOB 02/24/1932, MRN [id number]  PCP: Alonza Bogus, MD  Primary Cardiologist: Rozann Lesches, MD   Chief Complaint  Patient presents with  . Atrial Fibrillation  . Coronary Artery Disease  . Diastolic heart failure    History of Present Illness: [name] [name] is an 81 y.o. female last seen in April. At that time a referred her to see Dr. Lovena Le to further discuss possibility of elective cardioversion and potential antiarrhythmics therapy for management of atrial fibrillation. Decision was made to admit her for Tikosyn initiation. He was hospitalized in April, did ultimately have to undergo DCCV for restoration of sinus rhythm. Her most recent visit with Dr. Lovena Le in late May noted that she was maintaining sinus rhythm, although heart failure symptoms had not completely resolved to baseline. Lasix was temporarily increased.  She tells me today that she does feel better, not completely back to baseline, but much better than before. She does not endorse any chest pain symptoms or palpitations. She reports compliance with her medications. Remote device follow-up also continues through the device clinic.  Her weight has been relatively stable. Discussed her current medications.   Past Medical History  Diagnosis Date  . Osteoarthritis   . Mixed hyperlipidemia   . Coronary atherosclerosis of native coronary artery     Multivessel status post CABG, DES PLA March 2006  . Essential hypertension, benign   . Complete heart block   . Ventricular fibrillation 2003    a. seen on PPM interrogation a/w syncope  . Atrial fibrillation     a. anticoagulated with Warfarin  . Chronic diastolic heart failure     Past Surgical History  Procedure Laterality Date  . Coronary artery bypass graft      LIMA to LAD, SVG to diagonal, SVG to ramus and OM  . Cardiac defibrillator placement      MDT dual chamber ICD  . Yag laser  application Left 09/24/6193    Procedure: YAG LASER APPLICATION;  Surgeon: Elta Guadeloupe T. Gershon Crane, MD;  Location: [facility];  Service: Ophthalmology;  Laterality: Left;  . Yag laser application Right 0/93/2671    Procedure: YAG LASER APPLICATION;  Surgeon: Elta Guadeloupe T. Gershon Crane, MD;  Location: [facility];  Service: Ophthalmology;  Laterality: Right;  . Implantable cardioverter defibrillator generator change N/A 09/25/2013    Procedure: IMPLANTABLE CARDIOVERTER DEFIBRILLATOR GENERATOR CHANGE;  Surgeon: Evans Lance, MD;  Location: [facility] CATH LAB;  Service: Cardiovascular;  Laterality: N/A;  . Lead revision N/A 09/29/2013    Procedure: LEAD REVISION;  Surgeon: Deboraha Sprang, MD;  Location: [facility] CATH LAB;  Service: Cardiovascular;  Laterality: N/A;  . Tonsillectomy    . Cardioversion N/A 08/09/2014    Procedure: CARDIOVERSION;  Surgeon: Sanda Klein, MD;  Location: [facility]OS[facility];  Service: Cardiovascular;  Laterality: N/A;    Current Outpatient Prescriptions  Medication Sig Dispense Refill  . acetaminophen (TYLENOL) 500 MG tablet Take 500 mg by mouth daily.     [name] albuterol (PROVENTIL HFA;VENTOLIN HFA) 108 (90 BASE) MCG/ACT inhaler Inhale 2 puffs into the lungs every 6 (six) hours as needed for wheezing or shortness of breath. 1 Inhaler 2  . amLODipine (NORVASC) 5 MG tablet TAKE ONE TABLET BY MOUTH ONCE DAILY. 30 tablet 6  . aspirin EC 81 MG tablet Take 81 mg by mouth every morning.     [name] atorvastatin (LIPITOR) 20 MG tablet Take 1 tablet (20 mg total) by mouth  at bedtime. 90 tablet 3  . Calcium Carbonate-Vitamin D (CALCIUM 600 + D PO) Take 1 tablet by mouth 2 (two) times daily.    . Cholecalciferol (VITAMIN D) 2000 UNITS CAPS Take 1 capsule by mouth at bedtime.     . dofetilide (TIKOSYN) 125 MCG capsule Take 1 capsule (125 mcg total) by mouth 2 (two) times daily. 175 capsule 1  . folic acid (FOLVITE) 1 MG tablet Take 1 mg by mouth 2 (two) times daily.     . furosemide (LASIX) 40 MG tablet Take one tablet by mouth every  morning    . losartan (COZAAR) 100 MG tablet Take 1 tablet (100 mg total) by mouth daily. 90 tablet 1  . metoprolol succinate (TOPROL-XL) 100 MG 24 hr tablet Take 1 tablet (100 mg total) by mouth every morning. Take with or immediately following a meal. 30 tablet 1  . Polyethyl Glycol-Propyl Glycol (SYSTANE OP) Place 1 drop into both eyes daily as needed (Dry eyes).    . potassium chloride SA (K-DUR,KLOR-CON) 20 MEQ tablet Take 20 mEq by mouth 3 (three) times daily.    [name] warfarin (COUMADIN) 2.5 MG tablet Take 1 tablet daily except 1 1/2 tablets on Tuesdays, Thursdays and Saturdays 48 tablet 6   No current facility-administered medications for this visit.    Allergies:  Codeine and Keflex   Social History: The patient  reports that she has never smoked. She has never used smokeless tobacco. She reports that she does not drink alcohol or use illicit drugs.   ROS:  Please see the history of present illness. Otherwise, complete review of systems is positive for easy bruising.  All other systems are reviewed and negative.   Physical Exam: VS:  BP 134/78 mmHg  Pulse 86  Ht 5\' 1"  (1.549 m)  Wt 168 lb (76.204 kg)  BMI 31.76 kg/m2  SpO2 98%, BMI Body mass index is 31.76 kg/(m^2).  Wt Readings from Last 3 Encounters:  09/24/14 168 lb (76.204 kg)  09/07/14 167 lb 6.4 oz (75.932 kg)  08/10/14 167 lb 12.8 oz (76.114 kg)     Appears comfortable at rest.  HEENT: Conjunctiva and lids normal, oropharynx clear. Neck: Supple, no elevated JVP or carotid bruits, no thyromegaly.  Lungs: Clear to auscultation, nonlabored breathing at rest.  Cardiac: Regular rate and rhythm, no S3, soft systolic murmur, no pericardial rub.  Thorax: Well-healed device pocket site. Abdomen: Soft, nontender, bowel sounds present.  Extremities: Trace ankle edema, distal pulses 2+.  Skin: Warm and dry.  Musculoskeletal: No kyphosis.  Neuropsychiatric: Alert and oriented x3, affect grossly appropriate.   ECG:  ECG is not ordered today.  Recent Labwork: 06/09/2014: ALT 18; AST 18; B Natriuretic Peptide 348.0* 08/09/2014: Hemoglobin 10.9*; Platelets 167 08/17/2014: BUN 27*; Creatinine 1.15; Magnesium 2.1; Potassium 4.8; Sodium 142   Other Studies Reviewed Today:  1. Echocardiogram 09/13/2013: Study Conclusions  - Left ventricle: The cavity size was normal. Wall thickness was increased in a pattern of mild LVH. Systolic function was normal. The estimated ejection fraction was in the range of 60% to 65%. Wall motion was normal; there were no regional wall motion abnormalities. Features are consistent with a pseudonormal left ventricular filling pattern, with concomitant abnormal relaxation and increased filling pressure (grade 2 diastolic dysfunction). Doppler parameters are consistent with elevated ventricular end-diastolic filling pressure. - Aortic valve: Mildly calcified annulus. Trileaflet; mildly calcified leaflets. There was very mild stenosis. There was no significant regurgitation. Mean gradient (S): 9 mm Hg. Valve  area (VTI): 1.52 cm^2. Valve area (Vmax): 1.71 cm^2. - Mitral valve: Calcified annulus. Mildly thickened leaflets . There was mild regurgitation. - Left atrium: The atrium was mildly to moderately dilated. - Right ventricle: The cavity size was mildly dilated. Pacer wire or catheter noted in right ventricle. - Right atrium: Central venous pressure (est): 3 mm Hg. - Atrial septum: No defect or patent foramen ovale was identified. - Tricuspid valve: There was mild regurgitation. - Pulmonary arteries: PA peak pressure: 42 mm Hg (S). - Pericardium, extracardiac: There was no pericardial effusion.  Impressions:  - Mild LVH with LVEF 63-14%, grade 2 diastolic dysfunction with increased filling pressures. MIld to moderate left atrial enlargement. Mild mitral regurgitation. Very mildly stenotic aortic valve. Device wire noted in right heart. Mild  tricuspid regurgitation with PASP 42 mmHg.  2. Lexiscan Cardiolite 06/15/2014: FINDINGS: Pharmacological stress  Baseline EKG shows ventricular pacing. After injection heart rate increased from 61 beats per min up to 75 beats per min, and blood pressure decreased from 139/76 and 113/66. The test was stopped after injection was complete, the patient did not experience any chest pain. Post-injection EKG showed no significant arrhythmias, findings not interpretable for ischemia because she was ventricular paced throughout the study.  Perfusion: No decreased activity in the left ventricle on stress imaging to suggest reversible ischemia or infarction.  Wall Motion: Normal left ventricular wall motion. No left ventricular dilation.  Left Ventricular Ejection Fraction: 77 %  End diastolic volume 55 ml  End systolic volume 12 ml  IMPRESSION: 1. No reversible ischemia or infarction.  2. Normal left ventricular wall motion.  3. Left ventricular ejection fraction 77%  4. Low-risk stress test findings*.   ASSESSMENT AND PLAN:  1. Symptomatic, paroxysmal atrial fibrillation now status post initiation of Tikosyn and elective cardioversion as outlined above. Patient does feel better, plan will be to continue current regimen and observation.  2. CAD status post CABG, negative ischemic workup done recently as outlined above. No active angina symptoms.  3. Chronic diastolic heart failure. Continue current diuretic dosing.  4. Medtronic ICD in place, keep follow-up in the device clinic.  Current medicines were reviewed at length with the patient today.   Disposition: FU with me in 3 months.   Signed, Satira Sark, MD, [facility] 09/24/2014 4:39 PM    [facility] at [facility] [address], [place], [place] [address] Phone: [phone]; Fax: [phone]

## 2014-09-24 NOTE — Patient Instructions (Signed)
Your physician recommends that you schedule a follow-up appointment in:  3 weeks with DR. McDowell\  Your physician recommends that you continue on your current medications as directed. Please refer to the Current Medication list given to you today.  Thanks for choosing [facility]!!!

## 2014-10-03 ENCOUNTER — Other Ambulatory Visit: Payer: Self-pay | Admitting: Nurse Practitioner

## 2014-10-10 ENCOUNTER — Ambulatory Visit (INDEPENDENT_AMBULATORY_CARE_PROVIDER_SITE_OTHER): Payer: Medicare Other | Admitting: *Deleted

## 2014-10-10 DIAGNOSIS — I482 Chronic atrial fibrillation, unspecified: Secondary | ICD-10-CM

## 2014-10-10 DIAGNOSIS — I4891 Unspecified atrial fibrillation: Secondary | ICD-10-CM

## 2014-10-10 DIAGNOSIS — Z5181 Encounter for therapeutic drug level monitoring: Secondary | ICD-10-CM | POA: Diagnosis not present

## 2014-10-10 LAB — POCT INR: INR: 2.1

## 2014-10-31 ENCOUNTER — Ambulatory Visit (INDEPENDENT_AMBULATORY_CARE_PROVIDER_SITE_OTHER): Payer: Medicare Other | Admitting: *Deleted

## 2014-10-31 DIAGNOSIS — I482 Chronic atrial fibrillation, unspecified: Secondary | ICD-10-CM

## 2014-10-31 DIAGNOSIS — Z5181 Encounter for therapeutic drug level monitoring: Secondary | ICD-10-CM

## 2014-10-31 DIAGNOSIS — I4891 Unspecified atrial fibrillation: Secondary | ICD-10-CM

## 2014-10-31 LAB — POCT INR: INR: 2.5

## 2014-11-07 ENCOUNTER — Other Ambulatory Visit (HOSPITAL_COMMUNITY): Payer: Self-pay | Admitting: Respiratory Therapy

## 2014-11-07 DIAGNOSIS — R0602 Shortness of breath: Secondary | ICD-10-CM

## 2014-11-09 ENCOUNTER — Other Ambulatory Visit: Payer: Self-pay | Admitting: Internal Medicine

## 2014-11-09 ENCOUNTER — Other Ambulatory Visit: Payer: Self-pay | Admitting: Nurse Practitioner

## 2014-11-22 ENCOUNTER — Other Ambulatory Visit (HOSPITAL_COMMUNITY): Payer: Self-pay | Admitting: Pulmonary Disease

## 2014-11-23 ENCOUNTER — Ambulatory Visit (HOSPITAL_COMMUNITY)
Admission: RE | Admit: 2014-11-23 | Discharge: 2014-11-23 | Disposition: A | Discharge status: Home / Self Care | Payer: Medicare Other | Source: Ambulatory Visit | Attending: Pulmonary Disease | Admitting: Pulmonary Disease

## 2014-11-23 DIAGNOSIS — R0602 Shortness of breath: Secondary | ICD-10-CM | POA: Insufficient documentation

## 2014-11-23 MED ORDER — ALBUTEROL SULFATE (2.5 MG/3ML) 0.083% IN NEBU
2.5000 mg | INHALATION_SOLUTION | Freq: Once | RESPIRATORY_TRACT | Status: AC
  Administered 2014-11-23: 2.5 mg via RESPIRATORY_TRACT

## 2014-11-26 ENCOUNTER — Other Ambulatory Visit (HOSPITAL_COMMUNITY): Payer: Self-pay | Admitting: Pulmonary Disease

## 2014-11-26 DIAGNOSIS — R911 Solitary pulmonary nodule: Secondary | ICD-10-CM

## 2014-11-27 ENCOUNTER — Ambulatory Visit (HOSPITAL_COMMUNITY)
Admission: RE | Admit: 2014-11-27 | Discharge: 2014-11-27 | Disposition: A | Discharge status: Home / Self Care | Payer: Medicare Other | Source: Ambulatory Visit | Attending: Pulmonary Disease | Admitting: Pulmonary Disease

## 2014-11-27 ENCOUNTER — Encounter: Payer: Self-pay | Admitting: Cardiology

## 2014-11-27 DIAGNOSIS — R911 Solitary pulmonary nodule: Secondary | ICD-10-CM | POA: Diagnosis not present

## 2014-11-27 DIAGNOSIS — R0602 Shortness of breath: Secondary | ICD-10-CM | POA: Diagnosis not present

## 2014-11-28 ENCOUNTER — Ambulatory Visit (INDEPENDENT_AMBULATORY_CARE_PROVIDER_SITE_OTHER): Payer: Medicare Other | Admitting: *Deleted

## 2014-11-28 ENCOUNTER — Other Ambulatory Visit (HOSPITAL_COMMUNITY): Payer: Self-pay | Admitting: Pulmonary Disease

## 2014-11-28 DIAGNOSIS — I482 Chronic atrial fibrillation, unspecified: Secondary | ICD-10-CM

## 2014-11-28 DIAGNOSIS — I4891 Unspecified atrial fibrillation: Secondary | ICD-10-CM | POA: Diagnosis not present

## 2014-11-28 DIAGNOSIS — Z5181 Encounter for therapeutic drug level monitoring: Secondary | ICD-10-CM

## 2014-11-28 DIAGNOSIS — Z1231 Encounter for screening mammogram for malignant neoplasm of breast: Secondary | ICD-10-CM

## 2014-11-28 LAB — POCT INR: INR: 2.4

## 2014-11-29 LAB — PULMONARY FUNCTION TEST
DL/VA % pred: 78 %
DL/VA: 3.47 ml/min/mmHg/L
DLCO unc % pred: 53 %
DLCO unc: 10.72 ml/min/mmHg
FEF 25-75 POST: 0.74 L/s
FEF 25-75 Pre: 0.57 L/sec
FEF2575-%Change-Post: 30 %
FEF2575-%PRED-PRE: 47 %
FEF2575-%Pred-Post: 61 %
FEV1-%Change-Post: 8 %
FEV1-%PRED-POST: 69 %
FEV1-%Pred-Pre: 64 %
FEV1-POST: 1.14 L
FEV1-Pre: 1.06 L
FEV1FVC-%Change-Post: 5 %
FEV1FVC-%PRED-PRE: 87 %
FEV6-%Change-Post: 3 %
FEV6-%PRED-PRE: 78 %
FEV6-%Pred-Post: 80 %
FEV6-POST: 1.68 L
FEV6-Pre: 1.64 L
FEV6FVC-%Pred-Post: 106 %
FEV6FVC-%Pred-Pre: 106 %
FVC-%CHANGE-POST: 3 %
FVC-%PRED-POST: 75 %
FVC-%Pred-Pre: 73 %
FVC-Post: 1.68 L
FVC-Pre: 1.64 L
POST FEV6/FVC RATIO: 100 %
PRE FEV1/FVC RATIO: 65 %
Post FEV1/FVC ratio: 68 %
Pre FEV6/FVC Ratio: 100 %
RV % PRED: 113 %
RV: 2.56 L
TLC % pred: 91 %
TLC: 4.22 L

## 2014-12-04 ENCOUNTER — Other Ambulatory Visit: Payer: Self-pay | Admitting: Cardiology

## 2014-12-05 ENCOUNTER — Telehealth: Payer: Self-pay | Admitting: *Deleted

## 2014-12-05 NOTE — Telephone Encounter (Signed)
Pt has started taking Doxycycline

## 2014-12-05 NOTE — Telephone Encounter (Signed)
Has wound on leg for stick.  Saw Dr Luan Pulling last Friday.  Was started on Doxycycline then.  Made appt for INR check on 12/10/14.

## 2014-12-10 ENCOUNTER — Ambulatory Visit (INDEPENDENT_AMBULATORY_CARE_PROVIDER_SITE_OTHER): Payer: Medicare Other | Admitting: *Deleted

## 2014-12-10 DIAGNOSIS — I482 Chronic atrial fibrillation, unspecified: Secondary | ICD-10-CM

## 2014-12-10 DIAGNOSIS — I4891 Unspecified atrial fibrillation: Secondary | ICD-10-CM

## 2014-12-10 DIAGNOSIS — Z5181 Encounter for therapeutic drug level monitoring: Secondary | ICD-10-CM | POA: Diagnosis not present

## 2014-12-10 LAB — POCT INR: INR: 3.9

## 2014-12-13 ENCOUNTER — Encounter: Payer: Self-pay | Admitting: Internal Medicine

## 2014-12-13 ENCOUNTER — Ambulatory Visit (INDEPENDENT_AMBULATORY_CARE_PROVIDER_SITE_OTHER): Payer: Medicare Other | Admitting: Internal Medicine

## 2014-12-13 VITALS — BP 140/82 | HR 84 | Ht 61.0 in | Wt 166.0 lb

## 2014-12-13 DIAGNOSIS — I472 Ventricular tachycardia: Secondary | ICD-10-CM | POA: Diagnosis not present

## 2014-12-13 DIAGNOSIS — I10 Essential (primary) hypertension: Secondary | ICD-10-CM

## 2014-12-13 DIAGNOSIS — Z95810 Presence of automatic (implantable) cardiac defibrillator: Secondary | ICD-10-CM

## 2014-12-13 DIAGNOSIS — I480 Paroxysmal atrial fibrillation: Secondary | ICD-10-CM | POA: Diagnosis not present

## 2014-12-13 DIAGNOSIS — I4729 Other ventricular tachycardia: Secondary | ICD-10-CM

## 2014-12-13 DIAGNOSIS — I5032 Chronic diastolic (congestive) heart failure: Secondary | ICD-10-CM | POA: Diagnosis not present

## 2014-12-13 LAB — CUP PACEART INCLINIC DEVICE CHECK
Battery Remaining Longevity: 7
Brady Statistic AP VS Percent: 0.1 % — CL
Brady Statistic AS VS Percent: 0.1 % — CL
Date Time Interrogation Session: 20160825140843
Lead Channel Impedance Value: 361 Ohm
Lead Channel Impedance Value: 475 Ohm
Lead Channel Pacing Threshold Amplitude: 0.5 V
Lead Channel Pacing Threshold Amplitude: 0.75 V
Lead Channel Pacing Threshold Pulse Width: 0.4 ms
Lead Channel Sensing Intrinsic Amplitude: 3.8 mV
Lead Channel Setting Pacing Amplitude: 2 V
Lead Channel Setting Pacing Amplitude: 2.5 V
Lead Channel Setting Pacing Pulse Width: 0.4 ms
Lead Channel Setting Sensing Sensitivity: 0.3 mV
MDC IDC MSMT LEADCHNL RV PACING THRESHOLD PULSEWIDTH: 0.4 ms
MDC IDC SET ZONE DETECTION INTERVAL: 350 ms
MDC IDC STAT BRADY AP VP PERCENT: 94.7 %
MDC IDC STAT BRADY AS VP PERCENT: 5.3 %
Zone Setting Detection Interval: 300 ms
Zone Setting Detection Interval: 350 ms
Zone Setting Detection Interval: 450 ms

## 2014-12-13 NOTE — Patient Instructions (Addendum)
Your physician wants you to follow-up in: 6 months Dr. Lovena Le. You will receive a reminder letter in the mail two months in advance. If you don't receive a letter, please call our office to schedule the follow-up appointment.  Remote monitoring is used to monitor your Pacemaker of ICD from home. This monitoring reduces the number of office visits required to check your device to one time per year. It allows [place] to keep an eye on the functioning of your device to ensure it is working properly. You are scheduled for a device check from home on 03/18/15. You may send your transmission at any time that day. If you have a wireless device, the transmission will be sent automatically. After your physician reviews your transmission, you will receive a postcard with your next transmission date.  Your physician recommends that you continue on your current medications as directed. Please refer to the Current Medication list given to you today.  Thank you for choosing [facility]!

## 2014-12-13 NOTE — Assessment & Plan Note (Signed)
Her medtronic ICD is working normally. Will recheck in several months. 

## 2014-12-13 NOTE — Assessment & Plan Note (Signed)
She is maintaining NSR 99% of the time. She will continue Tikosyn. Will follow.

## 2014-12-13 NOTE — Assessment & Plan Note (Signed)
Her blood pressure is minimally elevated. I have asked her to try and lose weight and reduce her salt intake.

## 2014-12-13 NOTE — Assessment & Plan Note (Signed)
She has had no recurrent VT. She will continue Tikosyn and her beta blocker. Will follow.

## 2014-12-13 NOTE — Progress Notes (Signed)
HPI [name] [name] returns today for followup. She is a pleasant 81 yo woman with a h/o VF, CHB, s/p ICD implant who underwent ICD generator change several months ago. She developed worsening CHF and this coincided with the development of atrial fib. She was hospitalized and underwent Tikosyn initiation several months ago. She returns today for followup. Overall she is better. She notes that she bumped her leg on a bush and had a sore but was treated with anti-biotics.  Allergies  Allergen Reactions  . Codeine Nausea And Vomiting  . Keflex [Cephalexin] Itching and Rash     Current Outpatient Prescriptions  Medication Sig Dispense Refill  . acetaminophen (TYLENOL) 500 MG tablet Take 500 mg by mouth daily.     [name] albuterol (PROVENTIL HFA;VENTOLIN HFA) 108 (90 BASE) MCG/ACT inhaler Inhale 2 puffs into the lungs every 6 (six) hours as needed for wheezing or shortness of breath. 1 Inhaler 2  . amLODipine (NORVASC) 5 MG tablet TAKE ONE TABLET BY MOUTH ONCE DAILY. 30 tablet 6  . aspirin EC 81 MG tablet Take 81 mg by mouth every morning.     [name] atorvastatin (LIPITOR) 20 MG tablet Take 1 tablet (20 mg total) by mouth at bedtime. 90 tablet 3  . Calcium Carbonate-Vitamin D (CALCIUM 600 + D PO) Take 1 tablet by mouth 2 (two) times daily.    . Cholecalciferol (VITAMIN D) 2000 UNITS CAPS Take 1 capsule by mouth at bedtime.     . folic acid (FOLVITE) 1 MG tablet Take 1 mg by mouth 2 (two) times daily.     . furosemide (LASIX) 40 MG tablet Take one tablet by mouth every morning    . losartan (COZAAR) 100 MG tablet TAKE ONE TABLET BY MOUTH DAILY. 90 tablet 0  . metoprolol succinate (TOPROL-XL) 100 MG 24 hr tablet TAKE ONE TABLET BY MOUTH EVERY MORNING. TAKE WITH OR IMMEDIATELY FOLLOWING A MEAL. 30 tablet 6  . Polyethyl Glycol-Propyl Glycol (SYSTANE OP) Place 1 drop into both eyes daily as needed (Dry eyes).    . potassium chloride SA (K-DUR,KLOR-CON) 20 MEQ tablet Take 20 mEq by mouth 3 (three) times daily.     [name] TIKOSYN 125 MCG capsule TAKE (1) CAPSULE BY MOUTH TWICE DAILY. 180 capsule 0  . warfarin (COUMADIN) 2.5 MG tablet Take 1 tablet daily except 1 1/2 tablets on Tuesdays, Thursdays and Saturdays 48 tablet 6   No current facility-administered medications for this visit.     Past Medical History  Diagnosis Date  . Osteoarthritis   . Mixed hyperlipidemia   . Coronary atherosclerosis of native coronary artery     Multivessel status post CABG, DES PLA March 2006  . Essential hypertension, benign   . Complete heart block   . Ventricular fibrillation 2003    a. seen on PPM interrogation a/w syncope  . Atrial fibrillation     a. anticoagulated with Warfarin  . Chronic diastolic heart failure     ROS:   All systems reviewed and negative except as noted in the HPI.   Past Surgical History  Procedure Laterality Date  . Coronary artery bypass graft      LIMA to LAD, SVG to diagonal, SVG to ramus and OM  . Cardiac defibrillator placement      MDT dual chamber ICD  . Yag laser application Left 05/23/6331    Procedure: YAG LASER APPLICATION;  Surgeon: Elta Guadeloupe T. Gershon Crane, MD;  Location: [facility];  Service: Ophthalmology;  Laterality:  Left;  . Yag laser application Right 5/00/9381    Procedure: YAG LASER APPLICATION;  Surgeon: Elta Guadeloupe T. Gershon Crane, MD;  Location: [facility];  Service: Ophthalmology;  Laterality: Right;  . Implantable cardioverter defibrillator generator change N/A 09/25/2013    Procedure: IMPLANTABLE CARDIOVERTER DEFIBRILLATOR GENERATOR CHANGE;  Surgeon: Evans Lance, MD;  Location: [facility] CATH LAB;  Service: Cardiovascular;  Laterality: N/A;  . Lead revision N/A 09/29/2013    Procedure: LEAD REVISION;  Surgeon: Deboraha Sprang, MD;  Location: [facility] CATH LAB;  Service: Cardiovascular;  Laterality: N/A;  . Tonsillectomy    . Cardioversion N/A 08/09/2014    Procedure: CARDIOVERSION;  Surgeon: Sanda Klein, MD;  Location: [facility] ENDOSCOPY;  Service: Cardiovascular;  Laterality: N/A;     Family  History  Problem Relation Age of Onset  . Heart attack Father   . Heart attack Brother      Social History   Social History  . Marital Status: Widowed    Spouse Name: N/A  . Number of Children: 2  . Years of Education: N/A   Occupational History  . Retired     Equities trader  .     Social History Main Topics  . Smoking status: Never Smoker   . Smokeless tobacco: Never Used  . Alcohol Use: No  . Drug Use: No  . Sexual Activity: No   Other Topics Concern  . Not on file   Social History Narrative     BP 140/82 mmHg  Pulse 84  Ht 5\' 1"  (1.549 m)  Wt 166 lb (75.297 kg)  BMI 31.38 kg/m2  SpO2 98%  Physical Exam:  stable appearing 81 yo woman,NAD HEENT: Unremarkable Neck:  7 cm JVD, no thyromegally Back:  No CVA tenderness Lungs:  Clear with no wheezes HEART:  Regular rate rhythm, no murmurs, no rubs, no clicks Abd:  soft, positive bowel sounds, no organomegally, no rebound, no guarding Ext:  2 plus pulses, right leg with some edema, no cyanosis, no clubbing Skin:  No rashes no nodules Neuro:  CN II through XII intact, motor grossly intact    DEVICE  Normal device function.  See PaceArt for details.   Assess/Plan:

## 2014-12-25 ENCOUNTER — Ambulatory Visit: Payer: Medicare Other | Admitting: Cardiology

## 2015-01-02 ENCOUNTER — Ambulatory Visit (INDEPENDENT_AMBULATORY_CARE_PROVIDER_SITE_OTHER): Payer: Medicare Other | Admitting: *Deleted

## 2015-01-02 DIAGNOSIS — I4891 Unspecified atrial fibrillation: Secondary | ICD-10-CM | POA: Diagnosis not present

## 2015-01-02 DIAGNOSIS — I482 Chronic atrial fibrillation, unspecified: Secondary | ICD-10-CM

## 2015-01-02 DIAGNOSIS — Z5181 Encounter for therapeutic drug level monitoring: Secondary | ICD-10-CM

## 2015-01-02 LAB — POCT INR: INR: 2.9

## 2015-01-15 ENCOUNTER — Ambulatory Visit (INDEPENDENT_AMBULATORY_CARE_PROVIDER_SITE_OTHER): Payer: Medicare Other | Admitting: Cardiology

## 2015-01-15 ENCOUNTER — Encounter: Payer: Self-pay | Admitting: Cardiology

## 2015-01-15 VITALS — BP 128/80 | HR 89 | Ht 61.0 in | Wt 167.2 lb

## 2015-01-15 DIAGNOSIS — I480 Paroxysmal atrial fibrillation: Secondary | ICD-10-CM | POA: Diagnosis not present

## 2015-01-15 DIAGNOSIS — I10 Essential (primary) hypertension: Secondary | ICD-10-CM

## 2015-01-15 DIAGNOSIS — I2510 Atherosclerotic heart disease of native coronary artery without angina pectoris: Secondary | ICD-10-CM | POA: Diagnosis not present

## 2015-01-15 NOTE — Progress Notes (Signed)
Cardiology Office Note  Date: 01/15/2015   ID: Suhailah, Kwan 04/24/1931, MRN [id number]  PCP: Alonza Bogus, MD  Primary Cardiologist: Rozann Lesches, MD   Chief Complaint  Patient presents with  . PAF  . Coronary Artery Disease    History of Present Illness: YOSHI VICENCIO is an 81 y.o. female last seen in June. Interval visit with Dr. Lovena Le noted in August. Device interrogation found that she was in sinus rhythm 99% of the time on Tikosyn. She presents for a routine visit today. States that she is feeling better in terms of previous shortness of breath, does not endorse any chest pain or palpitations.  We reviewed her medications which are outlined below. Weight is stable. She has not gotten back to her regular exercise plan yet.  Lab work from April is reviewed below.   Past Medical History  Diagnosis Date  . Osteoarthritis   . Mixed hyperlipidemia   . Coronary atherosclerosis of native coronary artery     Multivessel status post CABG, DES PLA March 2006  . Essential hypertension, benign   . Complete heart block   . Ventricular fibrillation 2003    a. seen on PPM interrogation a/w syncope  . Atrial fibrillation     a. anticoagulated with Warfarin  . Chronic diastolic heart failure     Current Outpatient Prescriptions  Medication Sig Dispense Refill  . acetaminophen (TYLENOL) 500 MG tablet Take 500 mg by mouth daily.     [name] albuterol (PROVENTIL HFA;VENTOLIN HFA) 108 (90 BASE) MCG/ACT inhaler Inhale 2 puffs into the lungs every 6 (six) hours as needed for wheezing or shortness of breath. 1 Inhaler 2  . amLODipine (NORVASC) 5 MG tablet TAKE ONE TABLET BY MOUTH ONCE DAILY. 30 tablet 6  . atorvastatin (LIPITOR) 20 MG tablet Take 1 tablet (20 mg total) by mouth at bedtime. 90 tablet 3  . Calcium Carbonate-Vitamin D (CALCIUM 600 + D PO) Take 1 tablet by mouth 2 (two) times daily.    . Cholecalciferol (VITAMIN D) 2000 UNITS CAPS Take 1 capsule by mouth at bedtime.      . folic acid (FOLVITE) 1 MG tablet Take 1 mg by mouth 2 (two) times daily.     . furosemide (LASIX) 40 MG tablet Take one tablet by mouth every morning    . losartan (COZAAR) 100 MG tablet TAKE ONE TABLET BY MOUTH DAILY. 90 tablet 0  . metoprolol succinate (TOPROL-XL) 100 MG 24 hr tablet TAKE ONE TABLET BY MOUTH EVERY MORNING. TAKE WITH OR IMMEDIATELY FOLLOWING A MEAL. 30 tablet 6  . Polyethyl Glycol-Propyl Glycol (SYSTANE OP) Place 1 drop into both eyes daily as needed (Dry eyes).    . potassium chloride SA (K-DUR,KLOR-CON) 20 MEQ tablet Take 20 mEq by mouth 3 (three) times daily.    [name] TIKOSYN 125 MCG capsule TAKE (1) CAPSULE BY MOUTH TWICE DAILY. 180 capsule 0  . warfarin (COUMADIN) 2.5 MG tablet Take 1 tablet daily except 1 1/2 tablets on Tuesdays, Thursdays and Saturdays 48 tablet 6   No current facility-administered medications for this visit.    Allergies:  Codeine and Keflex   Social History: The patient  reports that she has never smoked. She has never used smokeless tobacco. She reports that she does not drink alcohol or use illicit drugs.   ROS:  Please see the history of present illness. Otherwise, complete review of systems is positive for none.  All other systems are reviewed and negative.  Physical Exam: VS:  BP 128/80 mmHg  Pulse 89  Ht 5\' 1"  (1.549 m)  Wt 167 lb 3.2 oz (75.841 kg)  BMI 31.61 kg/m2  SpO2 98%, BMI Body mass index is 31.61 kg/(m^2).  Wt Readings from Last 3 Encounters:  01/15/15 167 lb 3.2 oz (75.841 kg)  12/13/14 166 lb (75.297 kg)  09/24/14 168 lb (76.204 kg)     Appears comfortable at rest.  HEENT: Conjunctiva and lids normal, oropharynx clear. Neck: Supple, no elevated JVP or carotid bruits, no thyromegaly.  Lungs: Clear to auscultation, nonlabored breathing at rest.  Cardiac: Regular rate and rhythm, no S3, soft systolic murmur, no pericardial rub.  Thorax: Well-healed device pocket site. Abdomen: Soft, nontender, bowel sounds present.   Extremities: Trace ankle edema, distal pulses 2+.  Skin: Warm and dry.   ECG: Recent tracing from 12/13/2014 showed sinus rhythm with ventricular pacing.  Recent Labwork: 06/09/2014: ALT 18; AST 18; B Natriuretic Peptide 348.0* 08/09/2014: Hemoglobin 10.9*; Platelets 167 08/17/2014: BUN 27*; Creatinine, Ser 1.15; Magnesium 2.1; Potassium 4.8; Sodium 142    ASSESSMENT AND PLAN:  1. Paroxysmal atrial fibrillation, well-controlled on current regimen. No changes were made today. Follow-up BMET and magnesium on Tikosyn.  2. CAD status post CABG and PCI as outlined above, no active angina symptoms. Low risk Cardiolite noted in February 2016.  3. History of diastolic heart failure, complicated by atrial fibrillation, currently stable.  Current medicines were reviewed at length with the patient today.   Orders Placed This Encounter  Procedures  . Basic Metabolic Panel (BMET)  . Magnesium    Disposition: FU with me in 3 months.   Signed, Satira Sark, MD, [facility] 01/15/2015 12:10 PM    [facility] at [facility] [address], [place], [place] [address] Phone: [phone]; Fax: [phone]

## 2015-01-15 NOTE — Patient Instructions (Signed)
Your physician recommends that you schedule a follow-up appointment in: 3 months with Dr Domenic Polite    STOP Aspirin   Lab work: BMET and magnesium     Thank you for choosing [facility] !

## 2015-01-23 ENCOUNTER — Ambulatory Visit (HOSPITAL_COMMUNITY)
Admission: RE | Admit: 2015-01-23 | Discharge: 2015-01-23 | Disposition: A | Discharge status: Home / Self Care | Payer: Medicare Other | Source: Ambulatory Visit | Attending: Pulmonary Disease | Admitting: Pulmonary Disease

## 2015-01-23 DIAGNOSIS — Z1231 Encounter for screening mammogram for malignant neoplasm of breast: Secondary | ICD-10-CM | POA: Diagnosis present

## 2015-01-30 ENCOUNTER — Ambulatory Visit (INDEPENDENT_AMBULATORY_CARE_PROVIDER_SITE_OTHER): Payer: Medicare Other | Admitting: *Deleted

## 2015-01-30 DIAGNOSIS — I482 Chronic atrial fibrillation, unspecified: Secondary | ICD-10-CM

## 2015-01-30 DIAGNOSIS — Z5181 Encounter for therapeutic drug level monitoring: Secondary | ICD-10-CM | POA: Diagnosis not present

## 2015-01-30 DIAGNOSIS — I4891 Unspecified atrial fibrillation: Secondary | ICD-10-CM

## 2015-01-30 LAB — POCT INR: INR: 3.5

## 2015-02-07 LAB — MAGNESIUM: MAGNESIUM: 2 mg/dL (ref 1.5–2.5)

## 2015-02-07 LAB — BASIC METABOLIC PANEL
BUN: 24 mg/dL (ref 7–25)
CALCIUM: 9.3 mg/dL (ref 8.6–10.4)
CO2: 28 mmol/L (ref 20–31)
Chloride: 105 mmol/L (ref 98–110)
Creat: 0.99 mg/dL — ABNORMAL HIGH (ref 0.60–0.88)
GLUCOSE: 92 mg/dL (ref 65–99)
Potassium: 4.4 mmol/L (ref 3.5–5.3)
SODIUM: 140 mmol/L (ref 135–146)

## 2015-02-18 ENCOUNTER — Ambulatory Visit (INDEPENDENT_AMBULATORY_CARE_PROVIDER_SITE_OTHER): Payer: Medicare Other | Admitting: *Deleted

## 2015-02-18 DIAGNOSIS — I482 Chronic atrial fibrillation, unspecified: Secondary | ICD-10-CM

## 2015-02-18 DIAGNOSIS — Z5181 Encounter for therapeutic drug level monitoring: Secondary | ICD-10-CM

## 2015-02-18 DIAGNOSIS — I4891 Unspecified atrial fibrillation: Secondary | ICD-10-CM | POA: Diagnosis not present

## 2015-02-18 LAB — POCT INR: INR: 2.3

## 2015-03-11 ENCOUNTER — Ambulatory Visit (INDEPENDENT_AMBULATORY_CARE_PROVIDER_SITE_OTHER): Payer: Medicare Other | Admitting: *Deleted

## 2015-03-11 DIAGNOSIS — I4819 Other persistent atrial fibrillation: Secondary | ICD-10-CM

## 2015-03-11 DIAGNOSIS — I481 Persistent atrial fibrillation: Secondary | ICD-10-CM

## 2015-03-11 DIAGNOSIS — I482 Chronic atrial fibrillation, unspecified: Secondary | ICD-10-CM

## 2015-03-11 DIAGNOSIS — Z5181 Encounter for therapeutic drug level monitoring: Secondary | ICD-10-CM

## 2015-03-11 LAB — POCT INR: INR: 2.5

## 2015-03-18 ENCOUNTER — Ambulatory Visit (INDEPENDENT_AMBULATORY_CARE_PROVIDER_SITE_OTHER): Payer: Medicare Other | Admitting: *Deleted

## 2015-03-18 DIAGNOSIS — I472 Ventricular tachycardia: Secondary | ICD-10-CM | POA: Diagnosis not present

## 2015-03-19 NOTE — Progress Notes (Signed)
Remote ICD transmission.   

## 2015-03-27 ENCOUNTER — Telehealth: Payer: Self-pay | Admitting: *Deleted

## 2015-03-27 NOTE — Telephone Encounter (Signed)
Pt is on Doxycycline 100mg  bid and Prednisone taper 10mg  tablet (40mg  x 3, 30mg  x 3, 20mg  x 3, 10mg  x 3)  Started Saturday for bronchitis.  Told pt to decrease coumadin to 2.5mg  daily today thru Saturday, take 3.75mg  on Sunday and come for INR check on Monday.  Pt verbalized understanding.

## 2015-03-27 NOTE — Telephone Encounter (Signed)
pls call pt, she would like to speak w/ you

## 2015-03-28 LAB — CUP PACEART REMOTE DEVICE CHECK
Battery Remaining Longevity: 81 mo
Battery Voltage: 3 V
Brady Statistic AP VP Percent: 95.2 %
Brady Statistic AP VS Percent: 0.01 %
Brady Statistic AS VS Percent: 0 %
Date Time Interrogation Session: 20161128141805
HIGH POWER IMPEDANCE MEASURED VALUE: 68 Ohm
HighPow Impedance: 53 Ohm
Implantable Lead Implant Date: 20020823
Implantable Lead Location: 753860
Implantable Lead Serial Number: 112190
Lead Channel Impedance Value: 342 Ohm
Lead Channel Impedance Value: 475 Ohm
Lead Channel Pacing Threshold Amplitude: 0.75 V
Lead Channel Pacing Threshold Pulse Width: 0.4 ms
Lead Channel Pacing Threshold Pulse Width: 0.4 ms
Lead Channel Sensing Intrinsic Amplitude: 10.625 mV
Lead Channel Sensing Intrinsic Amplitude: 10.625 mV
Lead Channel Sensing Intrinsic Amplitude: 2.625 mV
Lead Channel Setting Pacing Amplitude: 2 V
Lead Channel Setting Pacing Pulse Width: 0.4 ms
MDC IDC LEAD IMPLANT DT: 20030823
MDC IDC LEAD LOCATION: 753859
MDC IDC LEAD MODEL: 157
MDC IDC LEAD SERIAL: 108215
MDC IDC MSMT LEADCHNL RA SENSING INTR AMPL: 2.625 mV
MDC IDC MSMT LEADCHNL RV IMPEDANCE VALUE: 361 Ohm
MDC IDC MSMT LEADCHNL RV PACING THRESHOLD AMPLITUDE: 0.625 V
MDC IDC SET LEADCHNL RV PACING AMPLITUDE: 2.5 V
MDC IDC SET LEADCHNL RV SENSING SENSITIVITY: 0.3 mV
MDC IDC STAT BRADY AS VP PERCENT: 4.78 %
MDC IDC STAT BRADY RA PERCENT PACED: 95.22 %
MDC IDC STAT BRADY RV PERCENT PACED: 99.98 %

## 2015-03-29 ENCOUNTER — Encounter: Payer: Self-pay | Admitting: Cardiology

## 2015-04-01 ENCOUNTER — Ambulatory Visit (INDEPENDENT_AMBULATORY_CARE_PROVIDER_SITE_OTHER): Payer: Medicare Other | Admitting: *Deleted

## 2015-04-01 DIAGNOSIS — I4891 Unspecified atrial fibrillation: Secondary | ICD-10-CM

## 2015-04-01 DIAGNOSIS — I482 Chronic atrial fibrillation, unspecified: Secondary | ICD-10-CM

## 2015-04-01 DIAGNOSIS — Z5181 Encounter for therapeutic drug level monitoring: Secondary | ICD-10-CM | POA: Diagnosis not present

## 2015-04-01 LAB — POCT INR: INR: 2

## 2015-04-03 ENCOUNTER — Other Ambulatory Visit: Payer: Self-pay | Admitting: Cardiology

## 2015-04-03 ENCOUNTER — Other Ambulatory Visit: Payer: Self-pay | Admitting: Internal Medicine

## 2015-04-04 ENCOUNTER — Ambulatory Visit: Payer: Medicare Other | Admitting: Cardiology

## 2015-04-25 ENCOUNTER — Encounter: Payer: Self-pay | Admitting: Cardiology

## 2015-04-25 ENCOUNTER — Ambulatory Visit (INDEPENDENT_AMBULATORY_CARE_PROVIDER_SITE_OTHER): Payer: Medicare Other | Admitting: Cardiology

## 2015-04-25 VITALS — BP 132/66 | HR 81 | Ht 61.0 in | Wt 169.6 lb

## 2015-04-25 DIAGNOSIS — I2510 Atherosclerotic heart disease of native coronary artery without angina pectoris: Secondary | ICD-10-CM

## 2015-04-25 DIAGNOSIS — I480 Paroxysmal atrial fibrillation: Secondary | ICD-10-CM

## 2015-04-25 NOTE — Progress Notes (Signed)
Cardiology Office Note  Date: 04/25/2015   ID: [name] [name], DOB 18-Nov-1931, MRN [id number]  PCP: Alonza Bogus, MD  Primary Cardiologist: Rozann Lesches, MD   Chief Complaint  Patient presents with  . PAF  . Coronary Artery Disease    History of Present Illness: [name] [name] is an 82 y.o. female last seen in September 2016. She presents for a routine follow-up visit today. Reports no significant change in chronic mild shortness of breath, no sense of palpitations.  We reviewed her medications today which are unchanged from a cardiac perspective, and outlined below. She continues on Coumadin with follow-up in the anticoagulation clinic. She does not report any spontaneous bleeding problems.  ECG today confirms dual-chamber paced rhythm.  Past Medical History  Diagnosis Date  . Osteoarthritis   . Mixed hyperlipidemia   . Coronary atherosclerosis of native coronary artery     Multivessel status post CABG, DES PLA March 2006  . Essential hypertension, benign   . Complete heart block ([place])   . Ventricular fibrillation ([place]) 2003    a. seen on PPM interrogation a/w syncope  . Atrial fibrillation ([place])     a. anticoagulated with Warfarin  . Chronic diastolic heart failure (HCC)     Current Outpatient Prescriptions  Medication Sig Dispense Refill  . acetaminophen (TYLENOL) 500 MG tablet Take 500 mg by mouth daily.     [name] albuterol (PROVENTIL HFA;VENTOLIN HFA) 108 (90 BASE) MCG/ACT inhaler Inhale 2 puffs into the lungs every 6 (six) hours as needed for wheezing or shortness of breath. 1 Inhaler 2  . amLODipine (NORVASC) 5 MG tablet TAKE ONE TABLET BY MOUTH ONCE DAILY. 30 tablet 6  . atorvastatin (LIPITOR) 20 MG tablet TAKE ONE TABLET BY MOUTH ONCE DAILY. 30 tablet 6  . Calcium Carbonate-Vitamin D (CALCIUM 600 + D PO) Take 1 tablet by mouth 2 (two) times daily.    . Cholecalciferol (VITAMIN D) 2000 UNITS CAPS Take 1 capsule by mouth at bedtime.     . folic acid (FOLVITE) 1  MG tablet Take 1 mg by mouth 2 (two) times daily.     . furosemide (LASIX) 40 MG tablet Take one tablet by mouth every morning    . losartan (COZAAR) 100 MG tablet TAKE ONE TABLET BY MOUTH DAILY. 90 tablet 0  . metoprolol succinate (TOPROL-XL) 100 MG 24 hr tablet TAKE ONE TABLET BY MOUTH EVERY MORNING. TAKE WITH OR IMMEDIATELY FOLLOWING A MEAL. 30 tablet 6  . Polyethyl Glycol-Propyl Glycol (SYSTANE OP) Place 1 drop into both eyes daily as needed (Dry eyes).    . potassium chloride SA (K-DUR,KLOR-CON) 20 MEQ tablet Take 20 mEq by mouth 3 (three) times daily.    [name] TIKOSYN 125 MCG capsule TAKE (1) CAPSULE BY MOUTH TWICE DAILY. 180 capsule 0  . warfarin (COUMADIN) 2.5 MG tablet Take 1 1/2 tablets daily except 1 tablet on Tuesdays and Fridays 48 tablet 6   No current facility-administered medications for this visit.   Allergies:  Codeine and Keflex   Social History: The patient  reports that she has never smoked. She has never used smokeless tobacco. She reports that she does not drink alcohol or use illicit drugs.   ROS:  Please see the history of present illness. Otherwise, complete review of systems is positive for chronic fatigue.  All other systems are reviewed and negative.   Physical Exam: VS:  BP 132/66 mmHg  Pulse 81  Ht 5\' 1"  (1.549 m)  Wt  169 lb 9.6 oz (76.93 kg)  BMI 32.06 kg/m2  SpO2 94%, BMI Body mass index is 32.06 kg/(m^2).  Wt Readings from Last 3 Encounters:  04/25/15 169 lb 9.6 oz (76.93 kg)  01/15/15 167 lb 3.2 oz (75.841 kg)  12/13/14 166 lb (75.297 kg)    Appears comfortable at rest.  HEENT: Conjunctiva and lids normal, oropharynx clear. Neck: Supple, no elevated JVP or carotid bruits, no thyromegaly.  Lungs: Clear to auscultation, nonlabored breathing at rest.  Cardiac: Regular rate and rhythm, no S3, soft systolic murmur, no pericardial rub.  Thorax: Well-healed device pocket site. Abdomen: Soft, nontender, bowel sounds present.  Extremities: Trace ankle  edema, distal pulses 2+.  Skin: Warm and dry.   ECG: ECG is ordered today.  Recent Labwork: 06/09/2014: ALT 18; AST 18; B Natriuretic Peptide 348.0* 08/09/2014: Hemoglobin 10.9*; Platelets 167 02/06/2015: BUN 24; Creat 0.99*; Magnesium 2.0; Potassium 4.4; Sodium 140   Assessment and Plan:  1. Paroxysmal atrial fibrillation, maintaining sinus rhythm, follow-up ECG reviewed. Continue current regimen including Coumadin, Toprol-XL, and Tikosyn.  2. Multivessel CAD status post CABG as well as DES to the PLA in 2006. No active angina symptoms. Follow-up Cardiolite in February 2016 was low risk.  3. Prior history of VF, status post ICD placement. Continue follow with Dr. Lovena Le.  Current medicines were reviewed with the patient today.   Orders Placed This Encounter  Procedures  . EKG 12-Lead    Disposition: FU with me in 4 months.   Signed, Satira Sark, MD, [facility] 04/25/2015 11:51 AM    [place] [place]. [address] [place], [place] [address] Phone: [phone]; Fax: [phone]

## 2015-04-25 NOTE — Patient Instructions (Signed)
Your physician wants you to follow-up in: 4 months  (May)You will receive a reminder letter in the mail two months in advance. If you don't receive a letter, please call our office to schedule the follow-up appointment.     Your physician recommends that you continue on your current medications as directed. Please refer to the Current Medication list given to you today.     If you need a refill on your cardiac medications before your next appointment, please call your pharmacy.       Thank you for choosing [facility] !

## 2015-05-03 ENCOUNTER — Other Ambulatory Visit: Payer: Self-pay | Admitting: Internal Medicine

## 2015-05-06 ENCOUNTER — Ambulatory Visit (INDEPENDENT_AMBULATORY_CARE_PROVIDER_SITE_OTHER): Payer: Medicare Other | Admitting: *Deleted

## 2015-05-06 DIAGNOSIS — I482 Chronic atrial fibrillation, unspecified: Secondary | ICD-10-CM

## 2015-05-06 DIAGNOSIS — I4891 Unspecified atrial fibrillation: Secondary | ICD-10-CM | POA: Diagnosis not present

## 2015-05-06 DIAGNOSIS — Z5181 Encounter for therapeutic drug level monitoring: Secondary | ICD-10-CM

## 2015-05-06 LAB — POCT INR: INR: 1.8

## 2015-05-17 ENCOUNTER — Other Ambulatory Visit: Payer: Self-pay | Admitting: Internal Medicine

## 2015-05-20 ENCOUNTER — Ambulatory Visit (INDEPENDENT_AMBULATORY_CARE_PROVIDER_SITE_OTHER): Payer: Medicare Other | Admitting: *Deleted

## 2015-05-20 DIAGNOSIS — I482 Chronic atrial fibrillation, unspecified: Secondary | ICD-10-CM

## 2015-05-20 DIAGNOSIS — I4891 Unspecified atrial fibrillation: Secondary | ICD-10-CM

## 2015-05-20 DIAGNOSIS — Z5181 Encounter for therapeutic drug level monitoring: Secondary | ICD-10-CM

## 2015-05-20 LAB — POCT INR: INR: 2.2

## 2015-05-31 ENCOUNTER — Telehealth: Payer: Self-pay | Admitting: Cardiology

## 2015-05-31 NOTE — Telephone Encounter (Signed)
Pt called in and stated that she thinks she heard her device alert tone go off. Instructed pt to send a manual transmission w/ home monitor. Pt verbalized understanding.

## 2015-05-31 NOTE — Telephone Encounter (Signed)
I informed patient that her remote was received and that her audible alert did not go off. Patient voiced understanding.

## 2015-06-12 IMAGING — MG MM DIGITAL DIAGNOSTIC UNILAT R
4 series · 4 of 4 positions shown · non-contrast
Comparison: Priors

ADDENDUM:
I have reviewed the mammogram and ultrasound findings and have
examined the patient prior to possible right breast
ultrasound-guided core biopsy. The patient is on Coumadin for atrial
fibrillation and does have multiple areas of bruising throughout her
body and focally within the right breast in the area of the
mammographic and ultrasound findings. Given the appearance on
ultrasound, the history, and the changes on physical examination,
the findings are very likely to be due to evolving fat necrosis. As
a result, I recommend followup right breast ultrasound in 3 months.
Following discussion with the patient, she prefers this as well.
CLINICAL DATA: Patient recalled from screening for right breast
mass.

EXAM:
DIGITAL DIAGNOSTIC  RIGHT MAMMOGRAM WITH CAD
ULTRASOUND RIGHT BREAST

[R CC (1 of 2)]
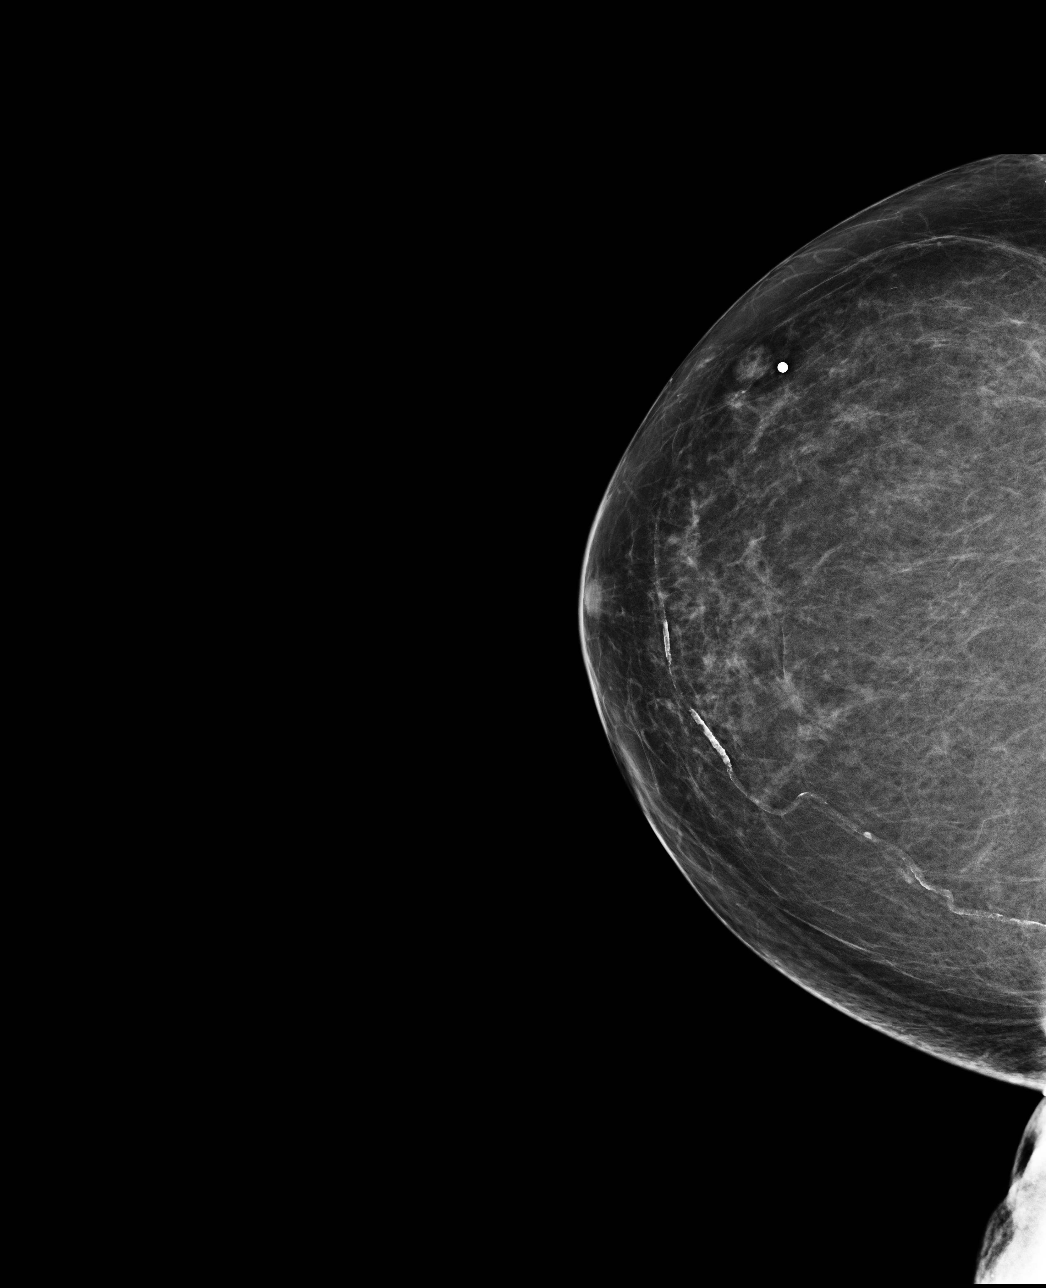

[R MLO (1 of 2)]
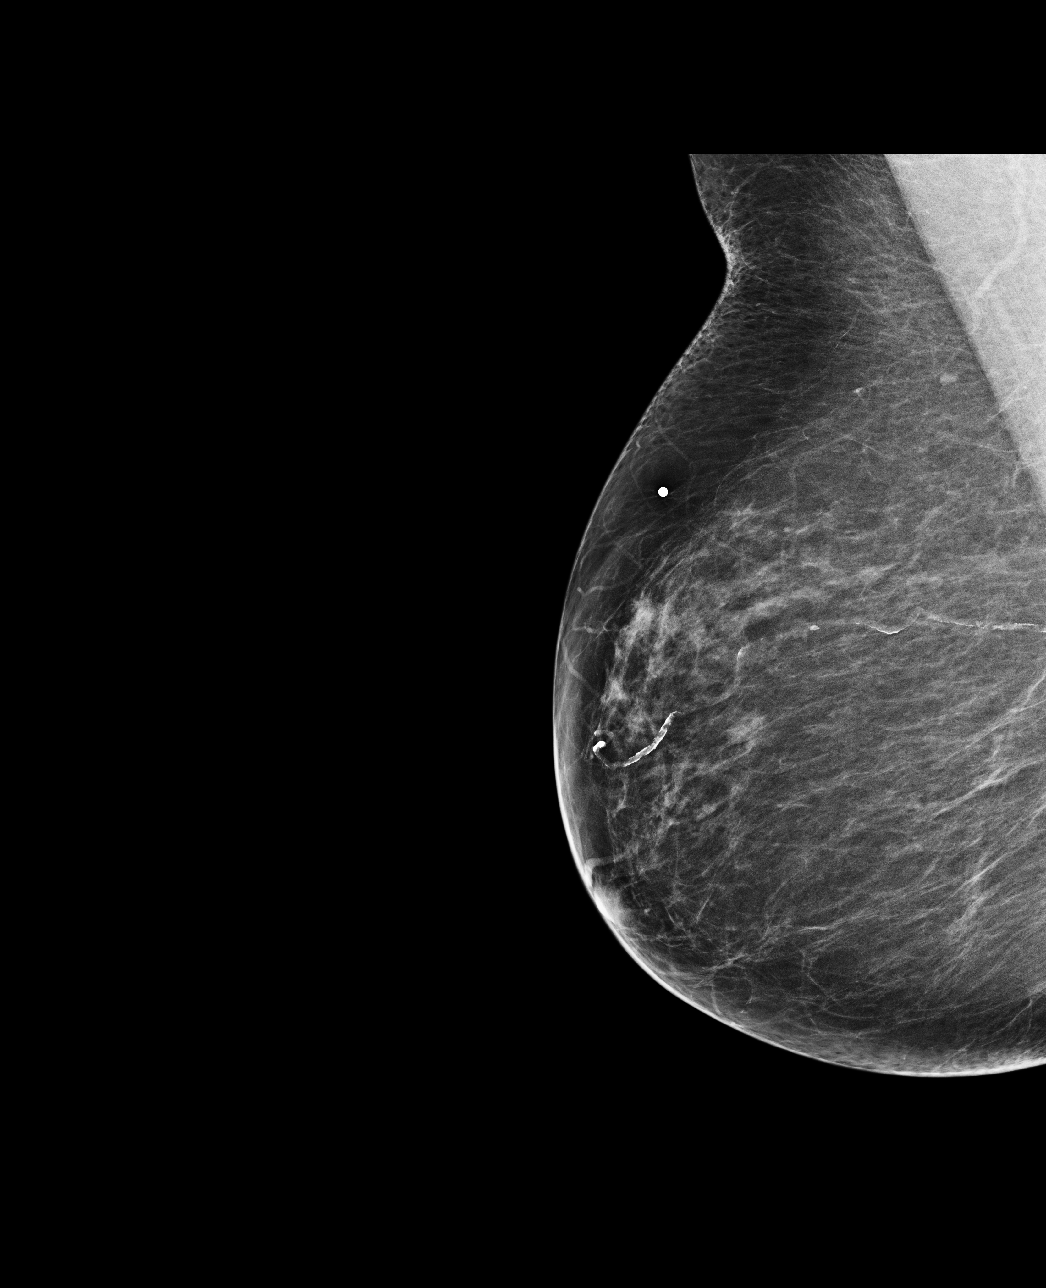

[R CC (2 of 2)]
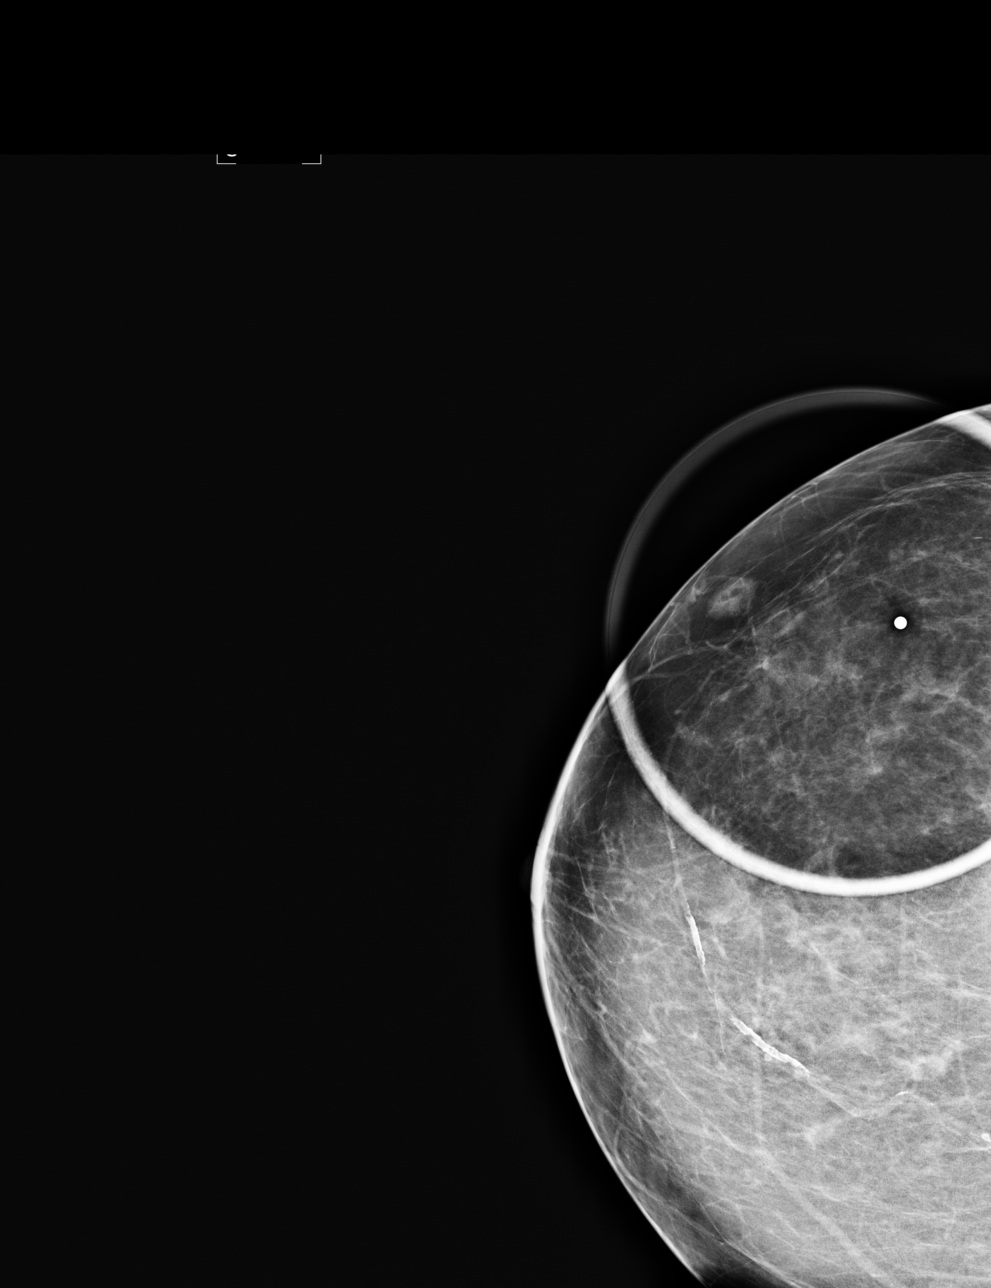

[R MLO (2 of 2)]
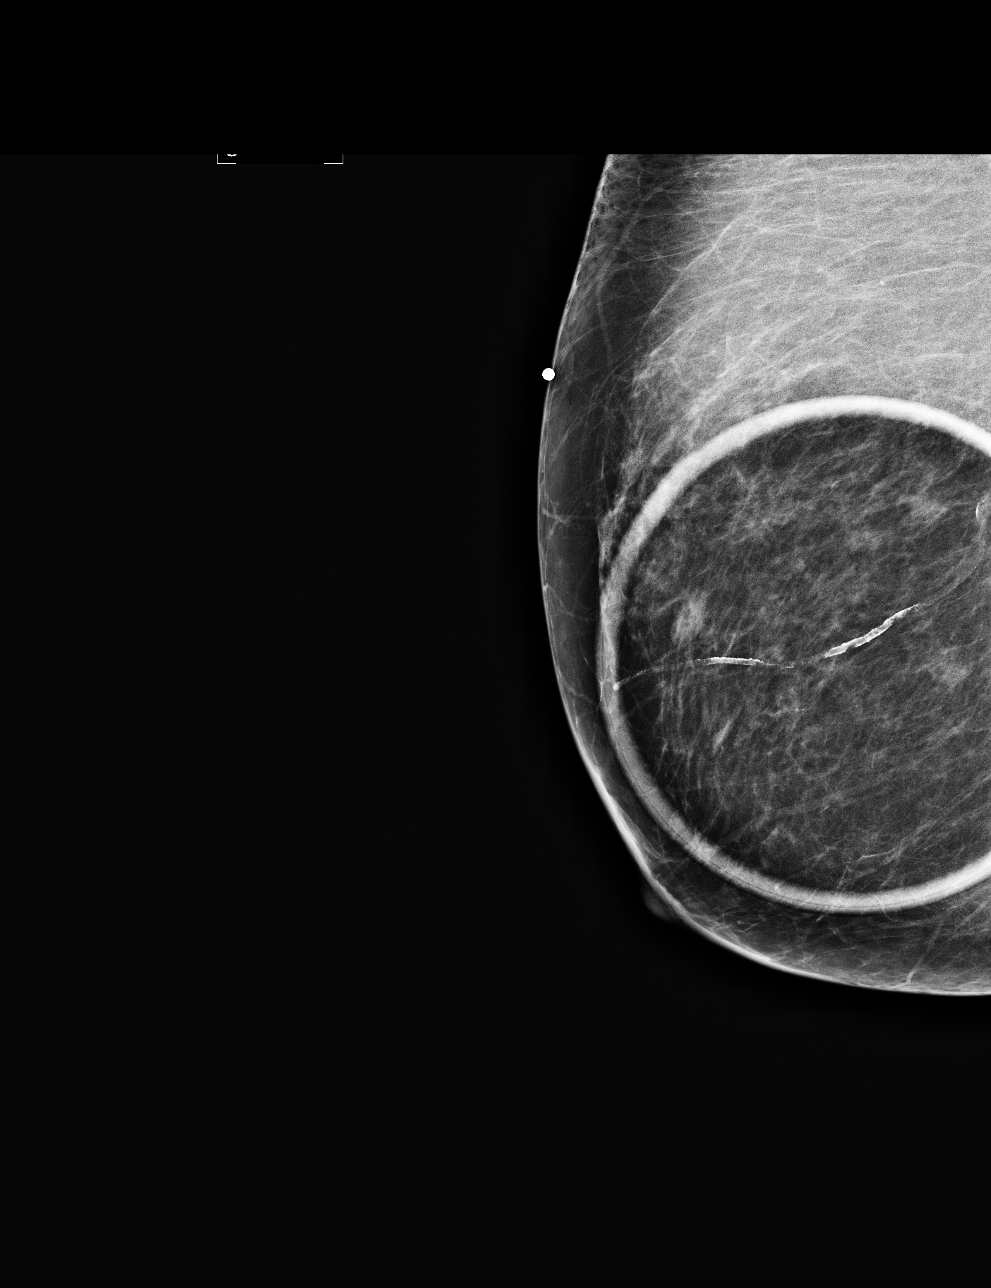

[4 of 4 positions shown; findings below may reference images not displayed]

IMPRESSION: Findings located within the right breast at the 9 o'clock position
are likely due to fat necrosis. I recommend followup right breast
ultrasound in 3 months.

Recommendation: Right breast ultrasound in 3 months.

3: Probably benign.
ACR Breast Density Category b: There are scattered areas of
fibroglandular density.
FINDINGS: Spot compression CC and MLO views of the right breast demonstrate a
10 mm oval mass within the upper-outer right breast.

Mammographic images were processed with CAD.

On physical exam, I palpate no discrete mass within the upper-outer
right breast.

Ultrasound is performed, showing a 10 x 5 x 12 mm oval lobular
predominantly echogenic mass with central hypoechogenicity within
the right breast 9 o'clock position 4 cm nipple, corresponding with
mammographic abnormality.
IMPRESSION: Mixed echogenicity mass within the right breast 9 o'clock position
may represent an area fat necrosis however malignancy can have a
similar appearance. Ultrasound-guided core needle biopsy is
recommended.

RECOMMENDATION:
Ultrasound-guided core needle biopsy right breast mass.

Coumadin however the recent INR was 2.7.

I have discussed the findings and recommendations with the patient.
Results were also provided in writing at the conclusion of the
visit. If applicable, a reminder letter will be sent to the patient
regarding the next appointment.

BI-RADS CATEGORY  4: Suspicious.

## 2015-06-17 ENCOUNTER — Ambulatory Visit (INDEPENDENT_AMBULATORY_CARE_PROVIDER_SITE_OTHER): Payer: Medicare Other | Admitting: *Deleted

## 2015-06-17 DIAGNOSIS — I4891 Unspecified atrial fibrillation: Secondary | ICD-10-CM

## 2015-06-17 DIAGNOSIS — I482 Chronic atrial fibrillation, unspecified: Secondary | ICD-10-CM

## 2015-06-17 DIAGNOSIS — Z5181 Encounter for therapeutic drug level monitoring: Secondary | ICD-10-CM | POA: Diagnosis not present

## 2015-06-17 LAB — POCT INR: INR: 2

## 2015-06-28 ENCOUNTER — Encounter: Payer: Self-pay | Admitting: Internal Medicine

## 2015-06-28 ENCOUNTER — Ambulatory Visit (INDEPENDENT_AMBULATORY_CARE_PROVIDER_SITE_OTHER): Payer: Medicare Other | Admitting: Internal Medicine

## 2015-06-28 VITALS — BP 118/62 | HR 91 | Ht 60.0 in | Wt 169.0 lb

## 2015-06-28 DIAGNOSIS — I5032 Chronic diastolic (congestive) heart failure: Secondary | ICD-10-CM | POA: Diagnosis not present

## 2015-06-28 NOTE — Progress Notes (Signed)
HPI [name] [name] returns today for followup. She is a pleasant 82 yo woman with a h/o VF, CHB, s/p ICD implant who underwent ICD generator change several months ago. She developed worsening CHF and this coincided with the development of atrial fib. She was hospitalized and underwent Tikosyn initiation several months ago. She returns today for followup. She has done well in the interim, and for the most part maintained NSR. She admits to dietary indiscretion but denies medical non-compliance.  Allergies  Allergen Reactions  . Codeine Nausea And Vomiting  . Keflex [Cephalexin] Itching and Rash     Current Outpatient Prescriptions  Medication Sig Dispense Refill  . acetaminophen (TYLENOL) 500 MG tablet Take 500 mg by mouth daily.     [name] albuterol (PROVENTIL HFA;VENTOLIN HFA) 108 (90 BASE) MCG/ACT inhaler Inhale 2 puffs into the lungs every 6 (six) hours as needed for wheezing or shortness of breath. 1 Inhaler 2  . amLODipine (NORVASC) 5 MG tablet TAKE ONE TABLET BY MOUTH ONCE DAILY. 30 tablet 6  . atorvastatin (LIPITOR) 20 MG tablet TAKE ONE TABLET BY MOUTH ONCE DAILY. 30 tablet 6  . Calcium Carbonate-Vitamin D (CALCIUM 600 + D PO) Take 1 tablet by mouth 2 (two) times daily.    . Cholecalciferol (VITAMIN D) 2000 UNITS CAPS Take 1 capsule by mouth at bedtime.     . folic acid (FOLVITE) 1 MG tablet Take 1 mg by mouth 2 (two) times daily.     . furosemide (LASIX) 40 MG tablet Take one tablet by mouth every morning    . losartan (COZAAR) 100 MG tablet TAKE ONE TABLET BY MOUTH DAILY. 90 tablet 1  . metoprolol succinate (TOPROL-XL) 100 MG 24 hr tablet TAKE ONE TABLET BY MOUTH EVERY MORNING. TAKE WITH OR IMMEDIATELY FOLLOWING A MEAL. 30 tablet 6  . Polyethyl Glycol-Propyl Glycol (SYSTANE OP) Place 1 drop into both eyes daily as needed (Dry eyes).    . potassium chloride SA (K-DUR,KLOR-CON) 20 MEQ tablet Take 20 mEq by mouth 3 (three) times daily.    [name] TIKOSYN 125 MCG capsule TAKE (1) CAPSULE BY  MOUTH TWICE DAILY. 180 capsule 3  . warfarin (COUMADIN) 2.5 MG tablet Take 1 1/2 tablets daily except 1 tablet on Tuesdays and Fridays 48 tablet 6   No current facility-administered medications for this visit.     Past Medical History  Diagnosis Date  . Osteoarthritis   . Mixed hyperlipidemia   . Coronary atherosclerosis of native coronary artery     Multivessel status post CABG, DES PLA March 2006  . Essential hypertension, benign   . Complete heart block ([place])   . Ventricular fibrillation ([place]) 2003    a. seen on PPM interrogation a/w syncope  . Atrial fibrillation ([place])     a. anticoagulated with Warfarin  . Chronic diastolic heart failure (HCC)     ROS:   All systems reviewed and negative except as noted in the HPI.   Past Surgical History  Procedure Laterality Date  . Coronary artery bypass graft      LIMA to LAD, SVG to diagonal, SVG to ramus and OM  . Cardiac defibrillator placement      MDT dual chamber ICD  . Yag laser application Left [id number]    Procedure: YAG LASER APPLICATION;  Surgeon: Elta Guadeloupe T. Gershon Crane, MD;  Location: [facility];  Service: Ophthalmology;  Laterality: Left;  . Yag laser application Right [id number]    Procedure: YAG LASER APPLICATION;  Surgeon:  Mark T. Gershon Crane, MD;  Location: [facility];  Service: Ophthalmology;  Laterality: Right;  . Implantable cardioverter defibrillator generator change N/A 09/25/2013    Procedure: IMPLANTABLE CARDIOVERTER DEFIBRILLATOR GENERATOR CHANGE;  Surgeon: Evans Lance, MD;  Location: [facility] CATH LAB;  Service: Cardiovascular;  Laterality: N/A;  . Lead revision N/A 09/29/2013    Procedure: LEAD REVISION;  Surgeon: Deboraha Sprang, MD;  Location: [facility] CATH LAB;  Service: Cardiovascular;  Laterality: N/A;  . Tonsillectomy    . Cardioversion N/A 08/09/2014    Procedure: CARDIOVERSION;  Surgeon: Sanda Klein, MD;  Location: [facility] ENDOSCOPY;  Service: Cardiovascular;  Laterality: N/A;     Family History  Problem Relation Age of Onset    . Heart attack Father   . Heart attack Brother      Social History   Social History  . Marital Status: Widowed    Spouse Name: N/A  . Number of Children: 2  . Years of Education: N/A   Occupational History  . Retired     Equities trader  .     Social History Main Topics  . Smoking status: Never Smoker   . Smokeless tobacco: Never Used  . Alcohol Use: No  . Drug Use: No  . Sexual Activity: No   Other Topics Concern  . Not on file   Social History Narrative     BP 118/62 mmHg  Pulse 91  Ht 5' (1.524 m)  Wt 169 lb (76.658 kg)  BMI 33.01 kg/m2  SpO2 96%  Physical Exam:  stable appearing 82 yo woman,NAD HEENT: Unremarkable Neck:  7 cm JVD, no thyromegally Back:  No CVA tenderness Lungs:  Clear with no wheezes HEART:  Regular rate rhythm, no murmurs, no rubs, no clicks Abd:  soft, positive bowel sounds, no organomegally, no rebound, no guarding Ext:  2 plus pulses, right leg with some edema, no cyanosis, no clubbing Skin:  No rashes no nodules Neuro:  CN II through XII intact, motor grossly intact    DEVICE  Normal device function.  See PaceArt for details.   Assess/Plan: 1. Atrial fib - she is in atrial fib today but has been maintaining NSR since her DCCV 11 months ago almost 99% of the time. No change in her meds. 2. Chronic diastolic heart failure - she is class 2. She is trying to do better on her salt intake.  3. HTN - her blood pressure is well controlled today.  4. VT/VF - she had a single episode of very fast PMVT back in November which stopped just before being shocked.   [name], M.D.

## 2015-06-28 NOTE — Patient Instructions (Addendum)

## 2015-07-02 ENCOUNTER — Other Ambulatory Visit: Payer: Self-pay | Admitting: Cardiology

## 2015-07-15 ENCOUNTER — Ambulatory Visit (INDEPENDENT_AMBULATORY_CARE_PROVIDER_SITE_OTHER): Payer: Medicare Other | Admitting: *Deleted

## 2015-07-15 DIAGNOSIS — I482 Chronic atrial fibrillation, unspecified: Secondary | ICD-10-CM

## 2015-07-15 DIAGNOSIS — Z5181 Encounter for therapeutic drug level monitoring: Secondary | ICD-10-CM | POA: Diagnosis not present

## 2015-07-15 DIAGNOSIS — I4891 Unspecified atrial fibrillation: Secondary | ICD-10-CM | POA: Diagnosis not present

## 2015-07-15 LAB — POCT INR: INR: 2.9

## 2015-07-15 MED ORDER — WARFARIN SODIUM 2.5 MG PO TABS: ORAL_TABLET | ORAL | Status: DC

## 2015-07-16 LAB — CUP PACEART INCLINIC DEVICE CHECK
Implantable Lead Implant Date: 20020823
Implantable Lead Location: 753859
Implantable Lead Model: 157
Implantable Lead Serial Number: 108215
Implantable Lead Serial Number: 112190
MDC IDC LEAD IMPLANT DT: 20030823
MDC IDC LEAD LOCATION: 753860
MDC IDC SESS DTM: 20170328115630

## 2015-08-07 ENCOUNTER — Other Ambulatory Visit: Payer: Self-pay | Admitting: Internal Medicine

## 2015-08-19 ENCOUNTER — Ambulatory Visit (INDEPENDENT_AMBULATORY_CARE_PROVIDER_SITE_OTHER): Payer: Medicare Other | Admitting: *Deleted

## 2015-08-19 DIAGNOSIS — I4891 Unspecified atrial fibrillation: Secondary | ICD-10-CM

## 2015-08-19 DIAGNOSIS — Z5181 Encounter for therapeutic drug level monitoring: Secondary | ICD-10-CM

## 2015-08-19 DIAGNOSIS — I482 Chronic atrial fibrillation, unspecified: Secondary | ICD-10-CM

## 2015-08-19 LAB — POCT INR: INR: 2.6

## 2015-08-21 ENCOUNTER — Ambulatory Visit (HOSPITAL_COMMUNITY)
Admission: RE | Admit: 2015-08-21 | Discharge: 2015-08-21 | Disposition: A | Discharge status: Home / Self Care | Payer: Medicare Other | Source: Ambulatory Visit | Attending: Pulmonary Disease | Admitting: Pulmonary Disease

## 2015-08-21 ENCOUNTER — Other Ambulatory Visit (HOSPITAL_COMMUNITY): Payer: Self-pay | Admitting: Pulmonary Disease

## 2015-08-21 DIAGNOSIS — M25542 Pain in joints of left hand: Secondary | ICD-10-CM | POA: Insufficient documentation

## 2015-08-21 DIAGNOSIS — R52 Pain, unspecified: Secondary | ICD-10-CM

## 2015-08-21 DIAGNOSIS — M19042 Primary osteoarthritis, left hand: Secondary | ICD-10-CM | POA: Insufficient documentation

## 2015-09-20 ENCOUNTER — Emergency Department (HOSPITAL_COMMUNITY): Payer: Medicare Other

## 2015-09-20 ENCOUNTER — Emergency Department (HOSPITAL_COMMUNITY)
Admission: EM | Admit: 2015-09-20 | Discharge: 2015-09-20 | Disposition: A | Discharge status: Home / Self Care | Payer: Medicare Other | Attending: Emergency Medicine | Admitting: Emergency Medicine

## 2015-09-20 ENCOUNTER — Encounter (HOSPITAL_COMMUNITY): Payer: Self-pay | Admitting: *Deleted

## 2015-09-20 DIAGNOSIS — Z79899 Other long term (current) drug therapy: Secondary | ICD-10-CM | POA: Insufficient documentation

## 2015-09-20 DIAGNOSIS — Z7901 Long term (current) use of anticoagulants: Secondary | ICD-10-CM | POA: Insufficient documentation

## 2015-09-20 DIAGNOSIS — E782 Mixed hyperlipidemia: Secondary | ICD-10-CM | POA: Diagnosis not present

## 2015-09-20 DIAGNOSIS — I110 Hypertensive heart disease with heart failure: Secondary | ICD-10-CM | POA: Diagnosis not present

## 2015-09-20 DIAGNOSIS — Y999 Unspecified external cause status: Secondary | ICD-10-CM | POA: Diagnosis not present

## 2015-09-20 DIAGNOSIS — I4891 Unspecified atrial fibrillation: Secondary | ICD-10-CM | POA: Insufficient documentation

## 2015-09-20 DIAGNOSIS — Y929 Unspecified place or not applicable: Secondary | ICD-10-CM | POA: Insufficient documentation

## 2015-09-20 DIAGNOSIS — S01311A Laceration without foreign body of right ear, initial encounter: Secondary | ICD-10-CM

## 2015-09-20 DIAGNOSIS — W1809XA Striking against other object with subsequent fall, initial encounter: Secondary | ICD-10-CM | POA: Insufficient documentation

## 2015-09-20 DIAGNOSIS — Y939 Activity, unspecified: Secondary | ICD-10-CM | POA: Diagnosis not present

## 2015-09-20 DIAGNOSIS — Y92009 Unspecified place in unspecified non-institutional (private) residence as the place of occurrence of the external cause: Secondary | ICD-10-CM

## 2015-09-20 DIAGNOSIS — I4901 Ventricular fibrillation: Secondary | ICD-10-CM | POA: Diagnosis not present

## 2015-09-20 DIAGNOSIS — I2510 Atherosclerotic heart disease of native coronary artery without angina pectoris: Secondary | ICD-10-CM | POA: Insufficient documentation

## 2015-09-20 DIAGNOSIS — I5032 Chronic diastolic (congestive) heart failure: Secondary | ICD-10-CM | POA: Diagnosis not present

## 2015-09-20 DIAGNOSIS — W19XXXA Unspecified fall, initial encounter: Secondary | ICD-10-CM

## 2015-09-20 DIAGNOSIS — M1990 Unspecified osteoarthritis, unspecified site: Secondary | ICD-10-CM | POA: Insufficient documentation

## 2015-09-20 DIAGNOSIS — S0990XA Unspecified injury of head, initial encounter: Secondary | ICD-10-CM | POA: Insufficient documentation

## 2015-09-20 LAB — CBC WITH DIFFERENTIAL/PLATELET
BASOS ABS: 0 10*3/uL (ref 0.0–0.1)
BASOS PCT: 0 %
EOS PCT: 0 %
Eosinophils Absolute: 0 10*3/uL (ref 0.0–0.7)
HCT: 40.6 % (ref 36.0–46.0)
Hemoglobin: 13.2 g/dL (ref 12.0–15.0)
Lymphocytes Relative: 20 %
Lymphs Abs: 1.9 10*3/uL (ref 0.7–4.0)
MCH: 29.1 pg (ref 26.0–34.0)
MCHC: 32.5 g/dL (ref 30.0–36.0)
MCV: 89.6 fL (ref 78.0–100.0)
MONO ABS: 0.6 10*3/uL (ref 0.1–1.0)
MONOS PCT: 7 %
Neutro Abs: 7 10*3/uL (ref 1.7–7.7)
Neutrophils Relative %: 73 %
PLATELETS: 186 10*3/uL (ref 150–400)
RBC: 4.53 MIL/uL (ref 3.87–5.11)
RDW: 14.4 % (ref 11.5–15.5)
WBC: 9.6 10*3/uL (ref 4.0–10.5)

## 2015-09-20 LAB — BASIC METABOLIC PANEL
ANION GAP: 8 (ref 5–15)
BUN: 29 mg/dL — ABNORMAL HIGH (ref 6–20)
CALCIUM: 9.6 mg/dL (ref 8.9–10.3)
CO2: 27 mmol/L (ref 22–32)
CREATININE: 1.24 mg/dL — AB (ref 0.44–1.00)
Chloride: 106 mmol/L (ref 101–111)
GFR, EST AFRICAN AMERICAN: 46 mL/min — AB (ref >60)
GFR, EST NON AFRICAN AMERICAN: 40 mL/min — AB (ref >60)
GLUCOSE: 127 mg/dL — AB (ref 65–99)
Potassium: 4.3 mmol/L (ref 3.5–5.1)
Sodium: 141 mmol/L (ref 135–145)

## 2015-09-20 LAB — PROTIME-INR
INR: 1.98 — AB (ref 0.00–1.49)
PROTHROMBIN TIME: 22.4 s — AB (ref 11.6–15.2)

## 2015-09-20 NOTE — Discharge Instructions (Signed)
Take your usual prescriptions as previously directed. The tissue adhesive will "flake off" on it's own; do not pick at, scratch, rub or soak the area. Call your regular medical doctor today to schedule a follow up appointment within the next 3 days.  Return to the Emergency Department immediately sooner if worsening.

## 2015-09-20 NOTE — ED Provider Notes (Signed)
CSN: [id number]     Arrival date & time 09/20/15  [id number] History   First MD Initiated Contact with Patient 09/20/15 1302     Chief Complaint  Patient presents with  . Fall     HPI Pt was seen at 1300. Per pt, c/o sudden onset and resolution of one episode of fall that occurred this morning approximately 0530. Pt states she had gotten out of bed and "just fell," hitting her right ear against a small table. Pt states she has been "very nervous" since falling and is "worried about bleeding in my head and my pacemaker." Denies prodromal symptoms before falling. Denies defib discharge. Denies syncope, no AMS, no CP/palpitations, no SOB/cough, no abd pain, no N/V/D, no focal motor weakness, no tingling/numbness in extremities, no neck or back pain.     Past Medical History  Diagnosis Date  . Osteoarthritis   . Mixed hyperlipidemia   . Coronary atherosclerosis of native coronary artery     Multivessel status post CABG, DES PLA March 2006  . Essential hypertension, benign   . Complete heart block ([place])   . Ventricular fibrillation ([place]) 2003    a. seen on PPM interrogation a/w syncope  . Atrial fibrillation ([place])     a. anticoagulated with Warfarin  . Chronic diastolic heart failure [facility])    Past Surgical History  Procedure Laterality Date  . Coronary artery bypass graft      LIMA to LAD, SVG to diagonal, SVG to ramus and OM  . Cardiac defibrillator placement      MDT dual chamber ICD  . Yag laser application Left [id number]    Procedure: YAG LASER APPLICATION;  Surgeon: Elta Guadeloupe T. Gershon Crane, MD;  Location: [facility] ORS;  Service: Ophthalmology;  Laterality: Left;  . Yag laser application Right [id number]    Procedure: YAG LASER APPLICATION;  Surgeon: Elta Guadeloupe T. Gershon Crane, MD;  Location: [facility] ORS;  Service: Ophthalmology;  Laterality: Right;  . Implantable cardioverter defibrillator generator change N/A 09/25/2013    Procedure: IMPLANTABLE CARDIOVERTER DEFIBRILLATOR GENERATOR CHANGE;  Surgeon: Evans Lance, MD;   Location: [facility] CATH LAB;  Service: Cardiovascular;  Laterality: N/A;  . Lead revision N/A 09/29/2013    Procedure: LEAD REVISION;  Surgeon: Deboraha Sprang, MD;  Location: [facility] CATH LAB;  Service: Cardiovascular;  Laterality: N/A;  . Tonsillectomy    . Cardioversion N/A 08/09/2014    Procedure: CARDIOVERSION;  Surgeon: Sanda Klein, MD;  Location: [facility] ENDOSCOPY;  Service: Cardiovascular;  Laterality: N/A;   Family History  Problem Relation Age of Onset  . Heart attack Father   . Heart attack Brother    Social History  Substance Use Topics  . Smoking status: Never Smoker   . Smokeless tobacco: Never Used  . Alcohol Use: No   OB History    Gravida Para Term Preterm AB TAB SAB Ectopic Multiple Living   2 2 2       2      Review of Systems ROS: Statement: All systems negative except as marked or noted in the HPI; Constitutional: Negative for fever and chills. ; ; Eyes: Negative for eye pain, redness and discharge. ; ; ENMT: Negative for ear pain, hoarseness, nasal congestion, sinus pressure and sore throat. ; ; Cardiovascular: Negative for chest pain, palpitations, diaphoresis, dyspnea and peripheral edema. ; ; Respiratory: Negative for cough, wheezing and stridor. ; ; Gastrointestinal: Negative for nausea, vomiting, diarrhea, abdominal pain, blood in stool, hematemesis, jaundice and rectal bleeding. . ; ; Genitourinary: Negative  for dysuria, flank pain and hematuria. ; ; Musculoskeletal: +head injury. Negative for back pain and neck pain. Negative for swelling and deformity..; ; Skin: +superficial lac, bruising. Negative for pruritus, rash, blisters, and skin lesion.; ; Neuro: Negative for headache, lightheadedness and neck stiffness. Negative for weakness, altered level of consciousness, altered mental status, extremity weakness, paresthesias, involuntary movement, seizure and syncope.      Allergies  Codeine and Keflex  Home Medications   Prior to Admission medications   Medication Sig  Start Date End Date Taking? Authorizing Provider  acetaminophen (TYLENOL) 500 MG tablet Take 500 mg by mouth daily.     Historical Provider, MD  albuterol (PROVENTIL HFA;VENTOLIN HFA) 108 (90 BASE) MCG/ACT inhaler Inhale 2 puffs into the lungs every 6 (six) hours as needed for wheezing or shortness of breath. 07/05/14   Lendon Colonel, NP  amLODipine (NORVASC) 5 MG tablet TAKE ONE TABLET BY MOUTH ONCE DAILY. 07/02/15   Satira Sark, MD  atorvastatin (LIPITOR) 20 MG tablet TAKE ONE TABLET BY MOUTH ONCE DAILY. 04/03/15   Satira Sark, MD  Calcium Carbonate-Vitamin D (CALCIUM 600 + D PO) Take 1 tablet by mouth 2 (two) times daily.    Historical Provider, MD  Cholecalciferol (VITAMIN D) 2000 UNITS CAPS Take 1 capsule by mouth at bedtime.     Historical Provider, MD  folic acid (FOLVITE) 1 MG tablet Take 1 mg by mouth 2 (two) times daily.     Historical Provider, MD  furosemide (LASIX) 40 MG tablet Take one tablet by mouth every morning    Historical Provider, MD  losartan (COZAAR) 100 MG tablet TAKE ONE TABLET BY MOUTH DAILY. 05/03/15   Evans Lance, MD  metoprolol succinate (TOPROL-XL) 100 MG 24 hr tablet TAKE ONE TABLET BY MOUTH EVERY MORNING. TAKE WITH OR IMMEDIATELY FOLLOWING A MEAL. 04/03/15   Satira Sark, MD  Polyethyl Glycol-Propyl Glycol (SYSTANE OP) Place 1 drop into both eyes daily as needed (Dry eyes).    Historical Provider, MD  potassium chloride SA (K-DUR,KLOR-CON) 20 MEQ tablet Take 20 mEq by mouth 3 (three) times daily.    Historical Provider, MD  TIKOSYN 125 MCG capsule TAKE (1) CAPSULE BY MOUTH TWICE DAILY. 05/17/15   Satira Sark, MD  warfarin (COUMADIN) 2.5 MG tablet Take 1 1/2 tablets daily except 1 tablet on Fridays 07/15/15   Satira Sark, MD   BP 165/61 mmHg  Pulse 63  Temp(Src) 98.8 F (37.1 C) (Oral)  Resp 18  Ht 5\' 1"  (1.549 m)  Wt 169 lb (76.658 kg)  BMI 31.95 kg/m2  SpO2 99% Physical Exam  1305: Physical examination:  Nursing notes  reviewed; Vital signs and O2 SAT reviewed;  Constitutional: Well developed, Well nourished, Well hydrated, In no acute distress; Head:  Normocephalic, atraumatic; Eyes: EOMI, PERRL, No scleral icterus; ENMT: No EAC injury. TM's clear bilat. +"L" shaped superficial lac to right helix/helix crus. Mouth and pharynx normal, Mucous membranes moist; Neck: Supple, Full range of motion, No lymphadenopathy; Cardiovascular: Regular rate and rhythm, No gallop; Respiratory: Breath sounds clear & equal bilaterally, No wheezes.  Speaking full sentences with ease, Normal respiratory effort/excursion; Chest: Nontender, Movement normal; Abdomen: Soft, Nontender, Nondistended, Normal bowel sounds; Genitourinary: No CVA tenderness; Spine:  No midline CS, TS, LS tenderness.;; Extremities: Pulses normal, +ecchymosis in various stages to RUE. NT right shoulder/elbow/wrist/hand. Pelvis stable. No tenderness, No edema, No calf edema or asymmetry.; Neuro: AA&Ox3, Major CN grossly intact.  Speech clear. No gross  focal motor or sensory deficits in extremities. Climbs on and off stretcher easily by herself. Gait steady.; Skin: Color normal, Warm, Dry.; Psych:  Very anxious.    ED Course  Procedures (including critical care time)   LACERATION REPAIR Performed by: Alfonzo Feller Authorized by: Alfonzo Feller Consent: Verbal consent obtained. Risks and benefits: risks, benefits and alternatives were discussed Consent given by: patient Patient identity confirmed: provided demographic data Prepped and Draped in normal sterile fashion Wound explored  Laceration Location: right ear  Laceration Length: 3cm, "L" shaped, flap-like  No Foreign Bodies seen or palpated  Irrigation method: NS Amount of cleaning: standard  Skin closure: dermabond  Patient tolerance: Patient tolerated the procedure well with no immediate complications.      Labs Review Imaging Review I have personally reviewed and evaluated these  images and lab results as part of my medical decision-making.   EKG Interpretation None      MDM  MDM Reviewed: previous chart, nursing note and vitals Reviewed previous: labs Interpretation: labs and CT scan      Results for orders placed or performed during the hospital encounter of 09/20/15  Protime-INR  Result Value Ref Range   Prothrombin Time 22.4 (H) 11.6 - 15.2 seconds   INR 1.98 (H) 0.00 - 1.49  CBC with Differential  Result Value Ref Range   WBC 9.6 4.0 - 10.5 K/uL   RBC 4.53 3.87 - 5.11 MIL/uL   Hemoglobin 13.2 12.0 - 15.0 g/dL   HCT 40.6 36.0 - 46.0 %   MCV 89.6 78.0 - 100.0 fL   MCH 29.1 26.0 - 34.0 pg   MCHC 32.5 30.0 - 36.0 g/dL   RDW 14.4 11.5 - 15.5 %   Platelets 186 150 - 400 K/uL   Neutrophils Relative % 73 %   Neutro Abs 7.0 1.7 - 7.7 K/uL   Lymphocytes Relative 20 %   Lymphs Abs 1.9 0.7 - 4.0 K/uL   Monocytes Relative 7 %   Monocytes Absolute 0.6 0.1 - 1.0 K/uL   Eosinophils Relative 0 %   Eosinophils Absolute 0.0 0.0 - 0.7 K/uL   Basophils Relative 0 %   Basophils Absolute 0.0 0.0 - 0.1 K/uL  Basic metabolic panel  Result Value Ref Range   Sodium 141 135 - 145 mmol/L   Potassium 4.3 3.5 - 5.1 mmol/L   Chloride 106 101 - 111 mmol/L   CO2 27 22 - 32 mmol/L   Glucose, Bld 127 (H) 65 - 99 mg/dL   BUN 29 (H) 6 - 20 mg/dL   Creatinine, Ser 1.24 (H) 0.44 - 1.00 mg/dL   Calcium 9.6 8.9 - 10.3 mg/dL   GFR calc non Af Amer 40 (L) >60 mL/min   GFR calc Af Amer 46 (L) >60 mL/min   Anion gap 8 5 - 15   Ct Head Wo Contrast 09/20/2015  CLINICAL DATA:  Fall out of bed, right head injury EXAM: CT HEAD WITHOUT CONTRAST CT CERVICAL SPINE WITHOUT CONTRAST TECHNIQUE: Multidetector CT imaging of the head and cervical spine was performed following the standard protocol without intravenous contrast. Multiplanar CT image reconstructions of the cervical spine were also generated. COMPARISON:  CT head dated 06/09/2013. FINDINGS: CT HEAD FINDINGS No evidence of  parenchymal hemorrhage or extra-axial fluid collection. No mass lesion, mass effect, or midline shift. No CT evidence of acute infarction. Mild subcortical white matter and periventricular small vessel ischemic changes. Intracranial atherosclerosis. Global cortical atrophy.  No ventriculomegaly. The visualized paranasal sinuses are  essentially clear. The mastoid air cells are unopacified. No evidence of calvarial fracture. CT CERVICAL SPINE FINDINGS Reversal of the normal upper cervical lordosis. No evidence of fracture or dislocation. Vertebral body heights are maintained. Dens appears intact. No prevertebral soft tissue swelling. Mild to moderate degenerative changes, most prominent at C4-5. 3 mm anterolisthesis of C3 on C4. Visualized thyroid is unremarkable. Visualized lung apices are essentially clear. IMPRESSION: No evidence of acute intracranial abnormality. Atrophy with small vessel ischemic changes. No evidence of traumatic injury to the cervical spine. Mild to moderate degenerative changes. Electronically Signed   By: Julian Hy M.D.   On: 09/20/2015 12:33   Ct Cervical Spine Wo Contrast 09/20/2015  CLINICAL DATA:  Fall out of bed, right head injury EXAM: CT HEAD WITHOUT CONTRAST CT CERVICAL SPINE WITHOUT CONTRAST TECHNIQUE: Multidetector CT imaging of the head and cervical spine was performed following the standard protocol without intravenous contrast. Multiplanar CT image reconstructions of the cervical spine were also generated. COMPARISON:  CT head dated 06/09/2013. FINDINGS: CT HEAD FINDINGS No evidence of parenchymal hemorrhage or extra-axial fluid collection. No mass lesion, mass effect, or midline shift. No CT evidence of acute infarction. Mild subcortical white matter and periventricular small vessel ischemic changes. Intracranial atherosclerosis. Global cortical atrophy.  No ventriculomegaly. The visualized paranasal sinuses are essentially clear. The mastoid air cells are unopacified.  No evidence of calvarial fracture. CT CERVICAL SPINE FINDINGS Reversal of the normal upper cervical lordosis. No evidence of fracture or dislocation. Vertebral body heights are maintained. Dens appears intact. No prevertebral soft tissue swelling. Mild to moderate degenerative changes, most prominent at C4-5. 3 mm anterolisthesis of C3 on C4. Visualized thyroid is unremarkable. Visualized lung apices are essentially clear. IMPRESSION: No evidence of acute intracranial abnormality. Atrophy with small vessel ischemic changes. No evidence of traumatic injury to the cervical spine. Mild to moderate degenerative changes. Electronically Signed   By: Julian Hy M.D.   On: 09/20/2015 12:33    1350:   [employer] pacer interrogated; waiting for c/b from rep with report. Superficial "L" shaped lac to right ear helix and helicis crus closed with dermabond. Pt made aware lac was superficial and the corner of the "L" shape had very very thin skin and abraded; wound edges approximated as best as able and wound was hemostatic after dermabond applied. Pt verb understanding; though often is seen by ED staff touching and fussing with her right ear after dermabond applied. ED RN and I made pt aware to not touch the area; pt verb understanding.   1450:  [employer] paperwork received: pt apparently had atrial arrhythmia with ectopy for 10 minutes at 0942 this morning.  T/C to Cards Dr. Harl Bowie, case discussed, including:  HPI, pertinent PM/SHx, VS/PE, dx testing, ED course and treatment:  Agreeable to come to ED to look at report.   1605:  Cards MD has reviewed [employer] report: no concerning events that contributed to fall this morning. Will d/c pt. Dx and testing d/w pt and family.  Questions answered.  Verb understanding, agreeable to d/c home with outpt f/u.   Francine Graven, DO 09/21/15 2103

## 2015-09-20 NOTE — Progress Notes (Signed)
Asked to interpret ICD interrogation. No significant events noted. Patient with occasional short episodes of elevated atrial rates with regular intervals, during these episodes her ventricular rates are within normal limits. Overall benign findings, nothing to have contributed to her fall based on device interrogation.    Zandra Abts MD

## 2015-09-20 NOTE — ED Notes (Signed)
Pt states she was sitting at the edge of her bed when she fell. Pt has laceration to her outer right ear. Pt is on coumadin. Denies any loss of consciousness. Pt is not sure what made her fall. Pt denies any pain but states she is nervous. Pt is alert and oriented.

## 2015-09-20 NOTE — ED Notes (Signed)
Pt reports unsure how she fell out of bed at 0530 this am,but corner of bedside table causing laceration to left ear. Noted to also have extensive bruising to right upper and lower arm. Currently on coumadin for arrythmias. Has a Statistician.

## 2015-09-20 NOTE — ED Notes (Signed)
Completed [employer] pacemaker interagation

## 2015-09-30 ENCOUNTER — Ambulatory Visit (INDEPENDENT_AMBULATORY_CARE_PROVIDER_SITE_OTHER): Payer: Medicare Other | Admitting: *Deleted

## 2015-09-30 DIAGNOSIS — I482 Chronic atrial fibrillation, unspecified: Secondary | ICD-10-CM

## 2015-09-30 DIAGNOSIS — I4729 Other ventricular tachycardia: Secondary | ICD-10-CM

## 2015-09-30 DIAGNOSIS — Z5181 Encounter for therapeutic drug level monitoring: Secondary | ICD-10-CM

## 2015-09-30 DIAGNOSIS — I472 Ventricular tachycardia: Secondary | ICD-10-CM | POA: Diagnosis not present

## 2015-09-30 DIAGNOSIS — I4891 Unspecified atrial fibrillation: Secondary | ICD-10-CM | POA: Diagnosis not present

## 2015-09-30 LAB — POCT INR: INR: 2.6

## 2015-09-30 NOTE — Progress Notes (Signed)
Remote ICD transmission.   

## 2015-10-03 LAB — CUP PACEART REMOTE DEVICE CHECK
Battery Remaining Longevity: 75 mo
Brady Statistic AS VP Percent: 19.4 %
Brady Statistic RA Percent Paced: 80.55 %
HIGH POWER IMPEDANCE MEASURED VALUE: 58 Ohm
HighPow Impedance: 79 Ohm
Implantable Lead Implant Date: 20030823
Implantable Lead Location: 753859
Implantable Lead Model: 4086
Lead Channel Impedance Value: 399 Ohm
Lead Channel Impedance Value: 399 Ohm
Lead Channel Pacing Threshold Amplitude: 0.875 V
Lead Channel Pacing Threshold Amplitude: 0.875 V
Lead Channel Pacing Threshold Pulse Width: 0.4 ms
Lead Channel Sensing Intrinsic Amplitude: 3 mV
Lead Channel Setting Pacing Amplitude: 2 V
Lead Channel Setting Pacing Pulse Width: 0.4 ms
MDC IDC LEAD IMPLANT DT: 20020823
MDC IDC LEAD LOCATION: 753860
MDC IDC LEAD MODEL: 157
MDC IDC LEAD SERIAL: 108215
MDC IDC LEAD SERIAL: 112190
MDC IDC MSMT BATTERY VOLTAGE: 2.97 V
MDC IDC MSMT LEADCHNL RA IMPEDANCE VALUE: 475 Ohm
MDC IDC MSMT LEADCHNL RA SENSING INTR AMPL: 3 mV
MDC IDC MSMT LEADCHNL RV PACING THRESHOLD PULSEWIDTH: 0.4 ms
MDC IDC MSMT LEADCHNL RV SENSING INTR AMPL: 10.625 mV
MDC IDC MSMT LEADCHNL RV SENSING INTR AMPL: 10.625 mV
MDC IDC SESS DTM: 20170612062604
MDC IDC SET LEADCHNL RV PACING AMPLITUDE: 2.5 V
MDC IDC SET LEADCHNL RV SENSING SENSITIVITY: 0.3 mV
MDC IDC STAT BRADY AP VP PERCENT: 80.49 %
MDC IDC STAT BRADY AP VS PERCENT: 0.05 %
MDC IDC STAT BRADY AS VS PERCENT: 0.05 %
MDC IDC STAT BRADY RV PERCENT PACED: 99.89 %

## 2015-10-10 ENCOUNTER — Encounter: Payer: Self-pay | Admitting: Cardiology

## 2015-11-06 ENCOUNTER — Ambulatory Visit (HOSPITAL_COMMUNITY)
Admission: RE | Admit: 2015-11-06 | Discharge: 2015-11-06 | Disposition: A | Discharge status: Home / Self Care | Payer: Medicare Other | Source: Ambulatory Visit | Attending: Pulmonary Disease | Admitting: Pulmonary Disease

## 2015-11-06 ENCOUNTER — Other Ambulatory Visit (HOSPITAL_COMMUNITY): Payer: Self-pay | Admitting: Pulmonary Disease

## 2015-11-06 DIAGNOSIS — R059 Cough, unspecified: Secondary | ICD-10-CM

## 2015-11-06 DIAGNOSIS — R05 Cough: Secondary | ICD-10-CM | POA: Diagnosis not present

## 2015-11-11 ENCOUNTER — Ambulatory Visit (INDEPENDENT_AMBULATORY_CARE_PROVIDER_SITE_OTHER): Payer: Medicare Other | Admitting: *Deleted

## 2015-11-11 DIAGNOSIS — Z5181 Encounter for therapeutic drug level monitoring: Secondary | ICD-10-CM | POA: Diagnosis not present

## 2015-11-11 DIAGNOSIS — I482 Chronic atrial fibrillation, unspecified: Secondary | ICD-10-CM

## 2015-11-11 DIAGNOSIS — I4891 Unspecified atrial fibrillation: Secondary | ICD-10-CM | POA: Diagnosis not present

## 2015-11-11 LAB — POCT INR: INR: 2.7

## 2015-11-27 IMAGING — CT CT CHEST W/ CM
2 of 3 series · 15 of 36 positions shown, 18 images · IV contrast (Omnipaque 300)
Comparison: Chest radiographs dating back to 6853.

CLINICAL DATA: Short of breath.  Onset of symptoms 05/21/2014.

EXAM:
CT CHEST WITH CONTRAST
TECHNIQUE: Multidetector CT imaging of the chest was performed during
intravenous contrast administration.
CONTRAST:  64mL OMNIPAQUE IOHEXOL 300 MG/ML SOLN reduced dose
administered secondary to renal insufficiency.

[Series 2: chestroutine 5.0 b40f · axial · 0.62mm/px · z∈[-364,-88]mm · 12 of 65 slices shown, 15 images]
[im 5/65  mediastinal]
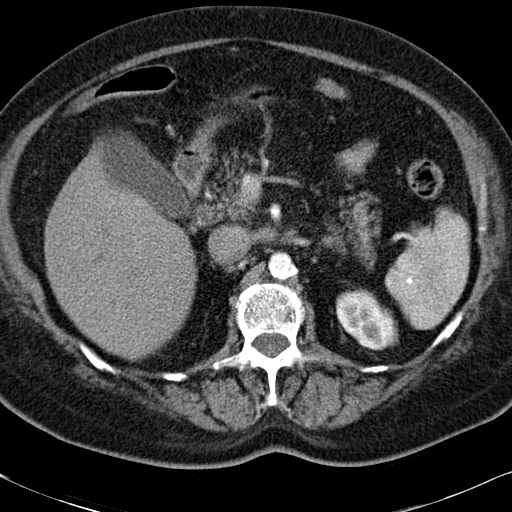
[im 5/65  lung]
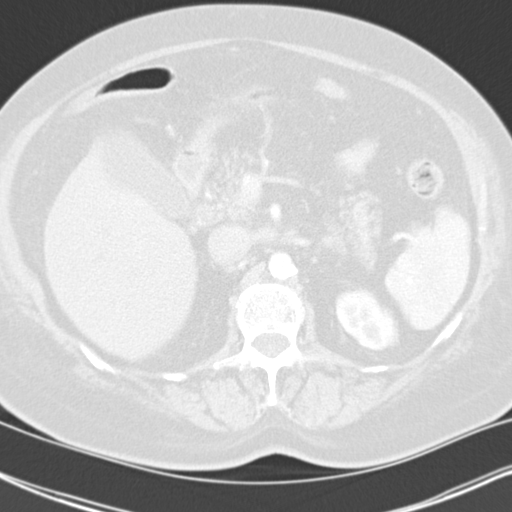
[im 10/65  lung]
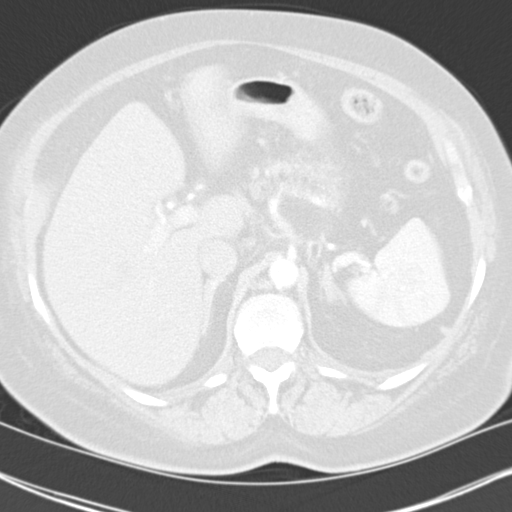
[im 15/65  lung]
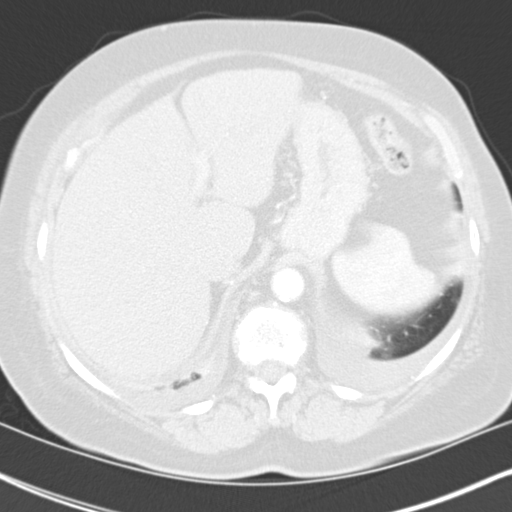
[im 19/65  lung]
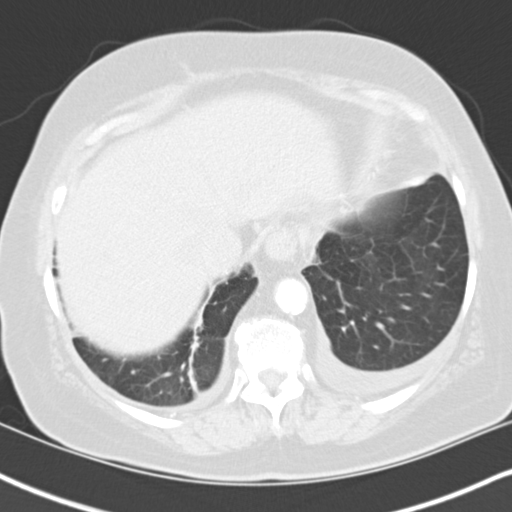
[im 24/65  mediastinal]
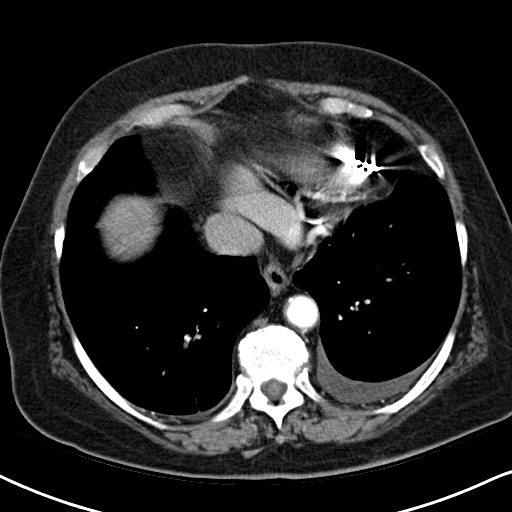
[im 24/65  lung]
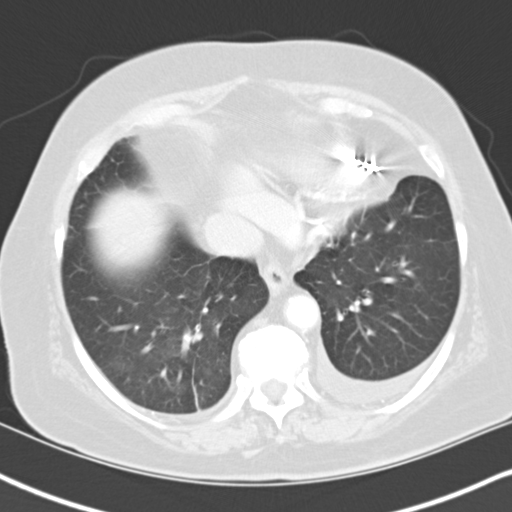
[im 29/65  lung]
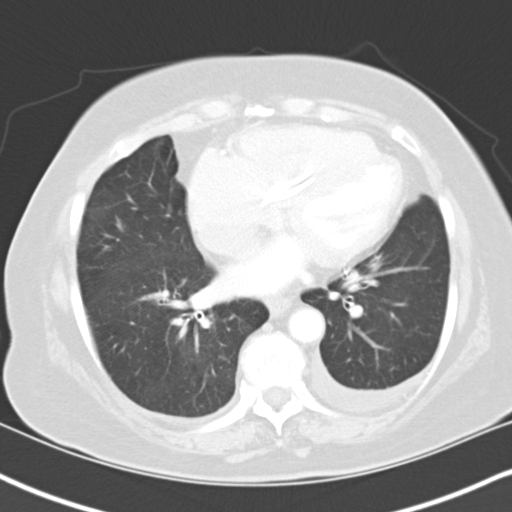
[im 36/65  lung]
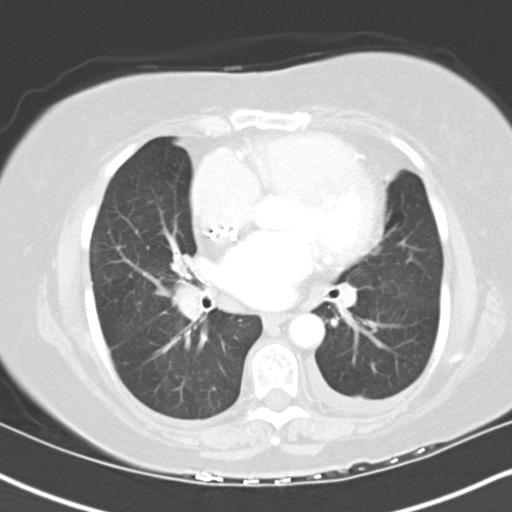
[im 41/65  lung]
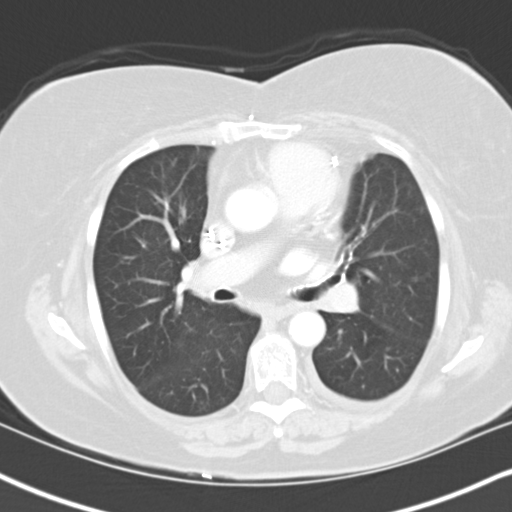
[im 46/65  mediastinal]
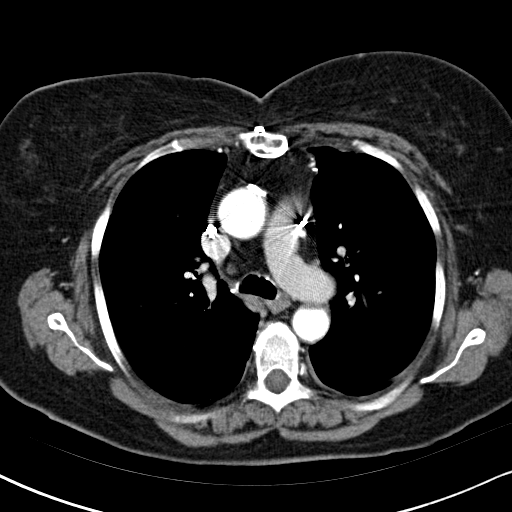
[im 46/65  lung]
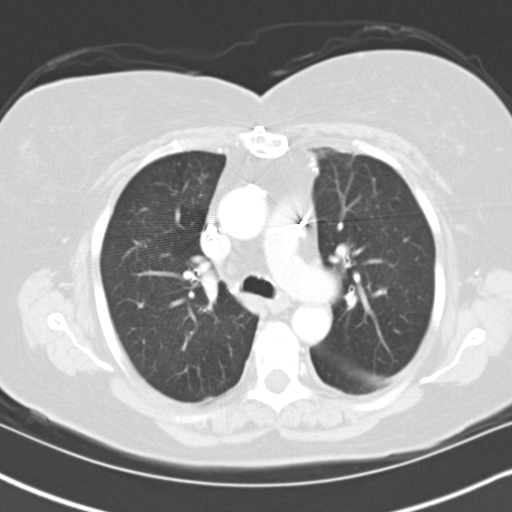
[im 50/65  lung]
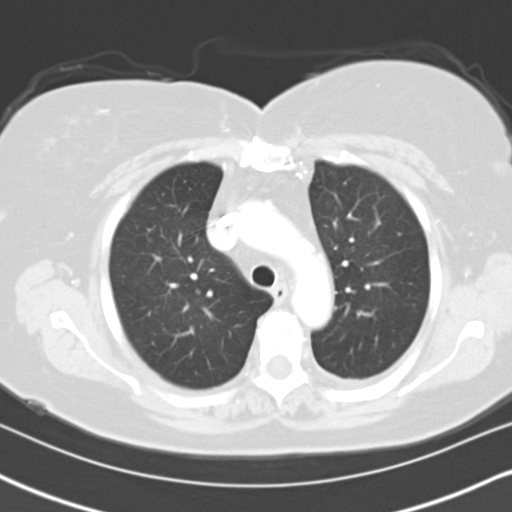
[im 55/65  lung]
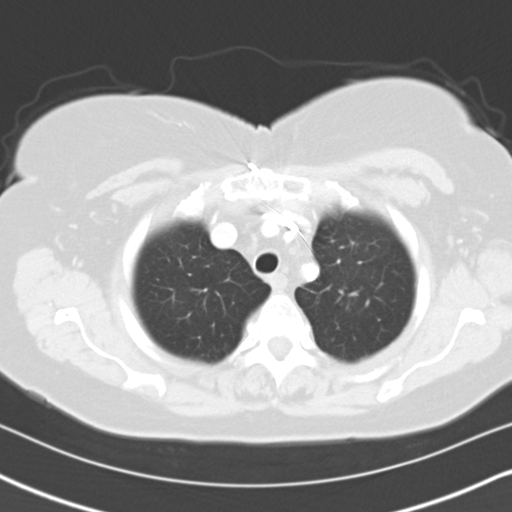
[im 60/65  lung]
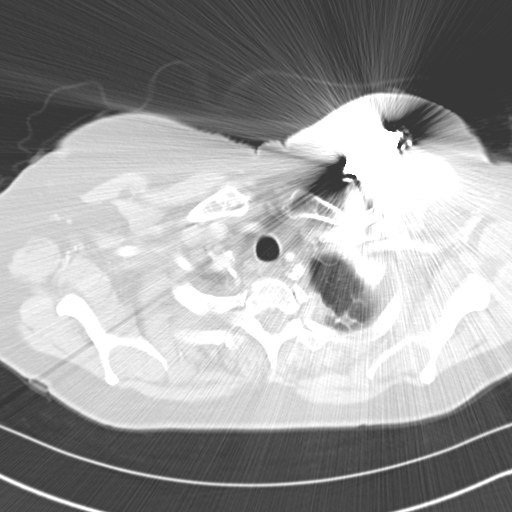

[Series 4: mpr coronal chest 3mm · coronal · 0.63mm/px · 3 of 96 slices shown]
[im 20/96  lung]
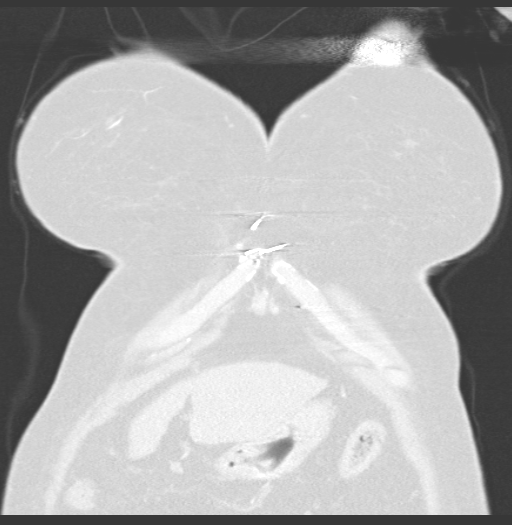
[im 39/96  lung]
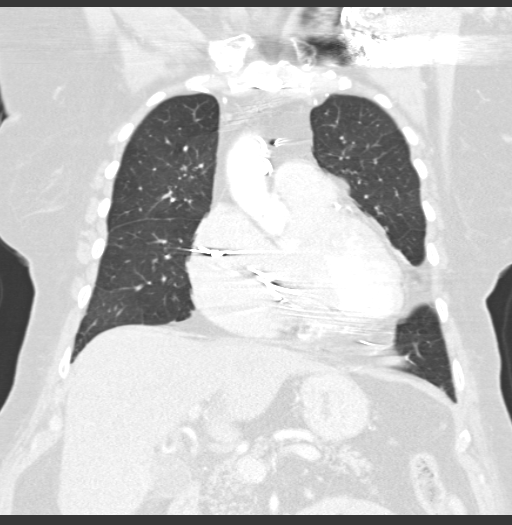
[im 58/96  lung]
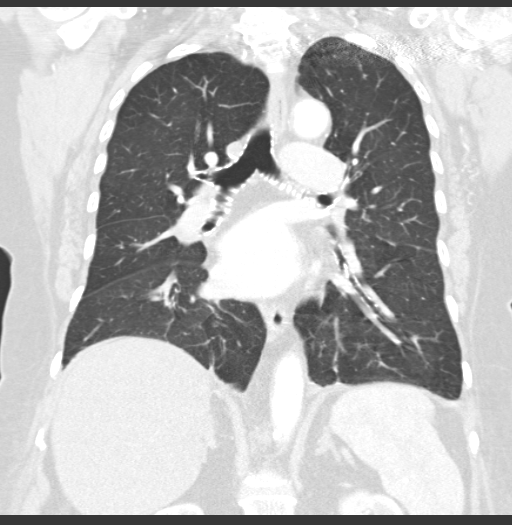

[15 of 36 positions shown; findings below may reference images not displayed]

FINDINGS: Musculoskeletal: Degenerative disc disease is present. No aggressive
osseous lesions. Thoracic vertebral body height is preserved. Median
sternotomy.

Lungs: Dependent atelectasis is present. There is no airspace
consolidation. LEFT upper lobe scarring is present extending into
the lingula. This is linear and has a typical appearance for
scarring. Ground-glass attenuation nodule is present in the RIGHT
middle lobe adjacent to the RIGHT mediastinal border. This probably
represents scarring however follow-up is required. This solitary
ground-glass attenuation pulmonary nodule measures 6 mm x 7 mm.

On reconstructed images, there is flattening of the hemidiaphragms
consistent with hyperinflation and emphysema.

Central airways: Patent.

Vasculature: CABG. Aortic arch atherosclerosis and tortuosity. No
acute vascular abnormality. The heart appears mildly enlarged.
Aortic diameter is within normal limits.

Effusions: Small bilateral, LEFT-greater-than-RIGHT.

Lymphadenopathy: No axillary adenopathy. No mediastinal or hilar
adenopathy.

Esophagus: Small hiatal hernia and patulous gastroesophageal
junction.

Upper abdomen: Small hyper attenuating lesion in the LEFT hepatic
dome statistically likely represents a flash fill hemangioma. This
is best seen on the coronal images.

Other: LEFT subclavian pacemaker.
IMPRESSION: 1. Small bilateral LEFT-greater-than- RIGHT pleural effusions.
2. Solitary ground-glass attenuation RIGHT middle lobe pulmonary
nodule. Initial follow-up by chest CT without contrast is
recommended in 3 months to confirm persistence. This recommendation
follows the consensus statement: Recommendations for the Management
of Subsolid Pulmonary Nodules Detected at CT: A Statement from the
3. CABG and atherosclerosis.
4. Hyperinflation of the chest, suggesting emphysema.

## 2015-11-28 ENCOUNTER — Other Ambulatory Visit: Payer: Self-pay | Admitting: Cardiology

## 2015-12-11 NOTE — Progress Notes (Signed)
Cardiology Office Note  Date: 12/12/2015   ID: [name], [name] 09/21/1931, MRN [id number]  PCP: Alonza Bogus, MD  Primary Cardiologist: Rozann Lesches, MD   Chief Complaint  Patient presents with  . Atrial firillaion    History of Present Illness: [name] [name] is an 82 y.o. female last seen in January. She presents for a routine follow-up visit. She does not report any significant palpitations or chest pain. Describes chronic fatigue as before, no significant change.   She continues on Coumadin with follow-up in the anticoagulation clinic. Reports no spontaneous bleeding problems, easy bruising. She also had an accidental fall back in June, was seen in the ER with head CT showing no acute events. She had her device interrogated at that time as well without any significant events of concern. Brief episode of atrial arrhythmia with ectopy for about 10 minutes.  I reviewed her lab work from June.  Past Medical History:  Diagnosis Date  . Atrial fibrillation ([place])    a. anticoagulated with Warfarin  . Chronic diastolic heart failure ([place])   . Complete heart block ([place])   . Coronary atherosclerosis of native coronary artery    Multivessel status post CABG, DES PLA March 2006  . Essential hypertension, benign   . Mixed hyperlipidemia   . Osteoarthritis   . Ventricular fibrillation ([place]) 2003   a. seen on PPM interrogation a/w syncope    Past Surgical History:  Procedure Laterality Date  . CARDIAC DEFIBRILLATOR PLACEMENT     MDT dual chamber ICD  . CARDIOVERSION N/A 08/09/2014   Procedure: CARDIOVERSION;  Surgeon: Sanda Klein, MD;  Location: [facility];  Service: Cardiovascular;  Laterality: N/A;  . CORONARY ARTERY BYPASS GRAFT     LIMA to LAD, SVG to diagonal, SVG to ramus and OM  . IMPLANTABLE CARDIOVERTER DEFIBRILLATOR GENERATOR CHANGE N/A 09/25/2013   Procedure: IMPLANTABLE CARDIOVERTER DEFIBRILLATOR GENERATOR CHANGE;  Surgeon: Evans Lance, MD;  Location: [facility]  CATH LAB;  Service: Cardiovascular;  Laterality: N/A;  . LEAD REVISION N/A 09/29/2013   Procedure: LEAD REVISION;  Surgeon: Deboraha Sprang, MD;  Location: [facility] CATH LAB;  Service: Cardiovascular;  Laterality: N/A;  . TONSILLECTOMY    . YAG LASER APPLICATION Left [id number]   Procedure: YAG LASER APPLICATION;  Surgeon: Elta Guadeloupe T. Gershon Crane, MD;  Location: [facility] ORS;  Service: Ophthalmology;  Laterality: Left;  . YAG LASER APPLICATION Right [id number]   Procedure: YAG LASER APPLICATION;  Surgeon: Elta Guadeloupe T. Gershon Crane, MD;  Location: [facility] ORS;  Service: Ophthalmology;  Laterality: Right;    Current Outpatient Prescriptions  Medication Sig Dispense Refill  . acetaminophen (TYLENOL) 500 MG tablet Take 500 mg by mouth daily.     [name] albuterol (PROVENTIL HFA;VENTOLIN HFA) 108 (90 BASE) MCG/ACT inhaler Inhale 2 puffs into the lungs every 6 (six) hours as needed for wheezing or shortness of breath. 1 Inhaler 2  . amLODipine (NORVASC) 5 MG tablet TAKE ONE TABLET BY MOUTH ONCE DAILY. 30 tablet 11  . atorvastatin (LIPITOR) 20 MG tablet TAKE ONE TABLET BY MOUTH ONCE DAILY. 30 tablet 6  . Calcium Carbonate-Vitamin D (CALCIUM 600 + D PO) Take 1 tablet by mouth 2 (two) times daily.    . Cholecalciferol (VITAMIN D) 2000 UNITS CAPS Take 1 capsule by mouth at bedtime.     . folic acid (FOLVITE) 1 MG tablet Take 1 mg by mouth 2 (two) times daily.     . furosemide (LASIX) 40 MG tablet Take one tablet by  mouth every morning    . losartan (COZAAR) 100 MG tablet TAKE ONE TABLET BY MOUTH DAILY. 90 tablet 1  . metoprolol succinate (TOPROL-XL) 100 MG 24 hr tablet TAKE ONE TABLET BY MOUTH EVERY MORNING. TAKE WITH OR IMMEDIATELY FOLLOWING A MEAL. 30 tablet 6  . Polyethyl Glycol-Propyl Glycol (SYSTANE OP) Place 1 drop into both eyes daily as needed (Dry eyes).    . potassium chloride SA (K-DUR,KLOR-CON) 20 MEQ tablet Take 20 mEq by mouth 3 (three) times daily.    [name] TIKOSYN 125 MCG capsule TAKE (1) CAPSULE BY MOUTH TWICE DAILY. 180 capsule 3  .  warfarin (COUMADIN) 2.5 MG tablet Take 1 1/2 tablets daily except 1 tablet on Fridays 54 tablet 6   No current facility-administered medications for this visit.    Allergies:  Codeine and Keflex [cephalexin]   Social History: The patient  reports that she has never smoked. She has never used smokeless tobacco. She reports that she does not drink alcohol or use drugs.   ROS:  Please see the history of present illness. Otherwise, complete review of systems is positive for chronic fatigue.  All other systems are reviewed and negative.   Physical Exam: VS:  BP 122/68   Pulse 91   Ht 5\' 1"  (1.549 m)   Wt 168 lb (76.2 kg)   SpO2 97%   BMI 31.74 kg/m , BMI Body mass index is 31.74 kg/m.  Wt Readings from Last 3 Encounters:  12/12/15 168 lb (76.2 kg)  09/20/15 169 lb (76.7 kg)  06/28/15 169 lb (76.7 kg)    Appears comfortable at rest.  HEENT: Conjunctiva and lids normal, oropharynx clear. Neck: Supple, no elevated JVP or carotid bruits, no thyromegaly.  Lungs: Clear to auscultation, nonlabored breathing at rest.  Cardiac: Regular rate and rhythm, no S3, soft systolic murmur, no pericardial rub.  Thorax: Well-healed device pocket site. Abdomen: Soft, nontender, bowel sounds present.  Extremities: Trace ankle edema, distal pulses 2+.  Skin: Warm and dry.Scattered ecchymoses on the arms. Musculoskeletal: No kyphosis. Neuropsychiatric: Alert and oriented 3, affect appropriate.  ECG: I personally reviewed the tracing from 04/25/2015 which showed dual-chamber pacing.  Recent Labwork: 02/06/2015: Magnesium 2.0 09/20/2015: BUN 29; Creatinine, Ser 1.24; Hemoglobin 13.2; Platelets 186; Potassium 4.3; Sodium 141     Component Value Date/Time   CHOL 111 01/24/2013 0946   TRIG 119 01/24/2013 0946   HDL 31 (L) 01/24/2013 0946   CHOLHDL 3.6 01/24/2013 0946   VLDL 24 01/24/2013 0946   LDLCALC 56 01/24/2013 0946    Other Studies Reviewed Today:  Echocardiogram 09/13/2013: Study  Conclusions  - Left ventricle: The cavity size was normal. Wall thickness was increased in a pattern of mild LVH. Systolic function was normal. The estimated ejection fraction was in the range of 60% to 65%. Wall motion was normal; there were no regional wall motion abnormalities. Features are consistent with a pseudonormal left ventricular filling pattern, with concomitant abnormal relaxation and increased filling pressure (grade 2 diastolic dysfunction). Doppler parameters are consistent with elevated ventricular end-diastolic filling pressure. - Aortic valve: Mildly calcified annulus. Trileaflet; mildly calcified leaflets. There was very mild stenosis. There was no significant regurgitation. Mean gradient (S): 9 mm Hg. Valve area (VTI): 1.52 cm^2. Valve area (Vmax): 1.71 cm^2. - Mitral valve: Calcified annulus. Mildly thickened leaflets . There was mild regurgitation. - Left atrium: The atrium was mildly to moderately dilated. - Right ventricle: The cavity size was mildly dilated. Pacer wire or catheter noted in right  ventricle. - Right atrium: Central venous pressure (est): 3 mm Hg. - Atrial septum: No defect or patent foramen ovale was identified. - Tricuspid valve: There was mild regurgitation. - Pulmonary arteries: PA peak pressure: 42 mm Hg (S). - Pericardium, extracardiac: There was no pericardial effusion.  Impressions:  - Mild LVH with LVEF 123456, grade 2 diastolic dysfunction with increased filling pressures. MIld to moderate left atrial enlargement. Mild mitral regurgitation. Very mildly stenotic aortic valve. Device wire noted in right heart. Mild tricuspid regurgitation with PASP 42 mmHg.  Lexiscan Cardiolite 06/15/2014: FINDINGS: Pharmacological stress  Baseline EKG shows ventricular pacing. After injection heart rate increased from 61 beats per min up to 75 beats per min, and blood pressure decreased from 139/76 and 113/66.  The test was stopped after injection was complete, the patient did not experience any chest pain. Post-injection EKG showed no significant arrhythmias, findings not interpretable for ischemia because she was ventricular paced throughout the study.  Perfusion: No decreased activity in the left ventricle on stress imaging to suggest reversible ischemia or infarction.  Wall Motion: Normal left ventricular wall motion. No left ventricular dilation.  Left Ventricular Ejection Fraction: 77 %  End diastolic volume 55 ml  End systolic volume 12 ml  IMPRESSION: 1. No reversible ischemia or infarction.  2. Normal left ventricular wall motion.  3. Left ventricular ejection fraction 77%  4. Low-risk stress test findings*.  Assessment and Plan:  1. Paroxysmal atrial fibrillation, symptomatically stable on current regimen including Tikosyn, Coumadin, and Toprol-XL. Continue current medications with follow-up in 6 months. BMET and magnesium at that time.  2. Chronic diastolic heart failure. LVEF 60-65% with moderate diastolic dysfunction. Reports no change in baseline dyspnea exertion. Weight is stable, down 1 pound from January.  3. Multivessel CAD status post CABG as well as DES to the PLA. She has had no active angina symptoms with low risk study from last year. Continue observation.  4. Essential hypertension, blood pressure is well controlled today.  Current medicines were reviewed with the patient today.  Disposition:   Signed, Satira Sark, MD, [facility] 12/12/2015 8:48 AM    [facility]. [address] [place], [place] [address] Phone: [phone]; Fax: [phone]

## 2015-12-12 ENCOUNTER — Encounter: Payer: Self-pay | Admitting: Cardiology

## 2015-12-12 ENCOUNTER — Ambulatory Visit (INDEPENDENT_AMBULATORY_CARE_PROVIDER_SITE_OTHER): Payer: Medicare Other | Admitting: Cardiology

## 2015-12-12 VITALS — BP 122/68 | HR 91 | Ht 61.0 in | Wt 168.0 lb

## 2015-12-12 DIAGNOSIS — Z79899 Other long term (current) drug therapy: Secondary | ICD-10-CM

## 2015-12-12 DIAGNOSIS — I480 Paroxysmal atrial fibrillation: Secondary | ICD-10-CM | POA: Diagnosis not present

## 2015-12-12 DIAGNOSIS — I2510 Atherosclerotic heart disease of native coronary artery without angina pectoris: Secondary | ICD-10-CM

## 2015-12-12 DIAGNOSIS — I5032 Chronic diastolic (congestive) heart failure: Secondary | ICD-10-CM | POA: Diagnosis not present

## 2015-12-12 DIAGNOSIS — I10 Essential (primary) hypertension: Secondary | ICD-10-CM

## 2015-12-12 NOTE — Patient Instructions (Signed)
Medication Instructions:  Your physician recommends that you continue on your current medications as directed. Please refer to the Current Medication list given to you today.   Labwork: Your physician recommends that you return for lab work in:  Just before next visit in 6 months    Testing/Procedures: none  Follow-Up: Your physician wants you to follow-up in: 6 months .  You will receive a reminder letter in the mail two months in advance. If you don't receive a letter, please call our office to schedule the follow-up appointment.   Any Other Special Instructions Will Be Listed Below (If Applicable).     If you need a refill on your cardiac medications before your next appointment, please call your pharmacy.

## 2015-12-16 ENCOUNTER — Other Ambulatory Visit: Payer: Self-pay | Admitting: Internal Medicine

## 2015-12-25 ENCOUNTER — Ambulatory Visit (INDEPENDENT_AMBULATORY_CARE_PROVIDER_SITE_OTHER): Payer: Medicare Other | Admitting: *Deleted

## 2015-12-25 DIAGNOSIS — Z5181 Encounter for therapeutic drug level monitoring: Secondary | ICD-10-CM | POA: Diagnosis not present

## 2015-12-25 DIAGNOSIS — I4891 Unspecified atrial fibrillation: Secondary | ICD-10-CM | POA: Diagnosis not present

## 2015-12-25 DIAGNOSIS — I482 Chronic atrial fibrillation, unspecified: Secondary | ICD-10-CM

## 2015-12-25 LAB — POCT INR: INR: 3.2

## 2015-12-30 ENCOUNTER — Ambulatory Visit (INDEPENDENT_AMBULATORY_CARE_PROVIDER_SITE_OTHER): Payer: Medicare Other | Admitting: *Deleted

## 2015-12-30 DIAGNOSIS — I4729 Other ventricular tachycardia: Secondary | ICD-10-CM

## 2015-12-30 DIAGNOSIS — I472 Ventricular tachycardia: Secondary | ICD-10-CM | POA: Diagnosis not present

## 2015-12-30 NOTE — Progress Notes (Signed)
Remote ICD transmission.   

## 2016-01-01 LAB — CUP PACEART REMOTE DEVICE CHECK
Battery Remaining Longevity: 68 mo
Battery Voltage: 2.95 V
Brady Statistic AP VP Percent: 95.63 %
Brady Statistic AP VS Percent: 0.05 %
Brady Statistic RA Percent Paced: 95.69 %
Brady Statistic RV Percent Paced: 99.95 %
HIGH POWER IMPEDANCE MEASURED VALUE: 55 Ohm
HIGH POWER IMPEDANCE MEASURED VALUE: 74 Ohm
Implantable Lead Implant Date: 20020823
Implantable Lead Location: 753859
Implantable Lead Location: 753860
Implantable Lead Serial Number: 108215
Implantable Lead Serial Number: 112190
Lead Channel Impedance Value: 342 Ohm
Lead Channel Impedance Value: 361 Ohm
Lead Channel Pacing Threshold Amplitude: 0.875 V
Lead Channel Pacing Threshold Pulse Width: 0.4 ms
Lead Channel Pacing Threshold Pulse Width: 0.4 ms
Lead Channel Sensing Intrinsic Amplitude: 10.625 mV
Lead Channel Sensing Intrinsic Amplitude: 10.625 mV
Lead Channel Setting Pacing Amplitude: 2 V
Lead Channel Setting Pacing Pulse Width: 0.4 ms
Lead Channel Setting Sensing Sensitivity: 0.3 mV
MDC IDC LEAD IMPLANT DT: 20030823
MDC IDC LEAD MODEL: 157
MDC IDC MSMT LEADCHNL RA IMPEDANCE VALUE: 418 Ohm
MDC IDC MSMT LEADCHNL RA SENSING INTR AMPL: 3 mV
MDC IDC MSMT LEADCHNL RA SENSING INTR AMPL: 3 mV
MDC IDC MSMT LEADCHNL RV PACING THRESHOLD AMPLITUDE: 1 V
MDC IDC SESS DTM: 20170911183732
MDC IDC SET LEADCHNL RV PACING AMPLITUDE: 2.5 V
MDC IDC STAT BRADY AS VP PERCENT: 4.31 %
MDC IDC STAT BRADY AS VS PERCENT: 0 %

## 2016-01-02 ENCOUNTER — Encounter: Payer: Self-pay | Admitting: Cardiology

## 2016-01-08 ENCOUNTER — Ambulatory Visit (HOSPITAL_COMMUNITY)
Admission: RE | Admit: 2016-01-08 | Discharge: 2016-01-08 | Disposition: A | Discharge status: Home / Self Care | Payer: Medicare Other | Source: Ambulatory Visit | Attending: Pulmonary Disease | Admitting: Pulmonary Disease

## 2016-01-08 ENCOUNTER — Other Ambulatory Visit (HOSPITAL_COMMUNITY): Payer: Self-pay | Admitting: Pulmonary Disease

## 2016-01-08 DIAGNOSIS — M545 Low back pain: Secondary | ICD-10-CM | POA: Diagnosis present

## 2016-01-08 DIAGNOSIS — I700 Atherosclerosis of aorta: Secondary | ICD-10-CM | POA: Diagnosis not present

## 2016-01-08 DIAGNOSIS — M4186 Other forms of scoliosis, lumbar region: Secondary | ICD-10-CM | POA: Insufficient documentation

## 2016-01-08 DIAGNOSIS — M11261 Other chondrocalcinosis, right knee: Secondary | ICD-10-CM | POA: Diagnosis not present

## 2016-01-08 DIAGNOSIS — M25561 Pain in right knee: Secondary | ICD-10-CM | POA: Diagnosis present

## 2016-01-08 DIAGNOSIS — M5136 Other intervertebral disc degeneration, lumbar region: Secondary | ICD-10-CM | POA: Insufficient documentation

## 2016-01-29 ENCOUNTER — Other Ambulatory Visit: Payer: Self-pay | Admitting: Internal Medicine

## 2016-01-29 ENCOUNTER — Ambulatory Visit (INDEPENDENT_AMBULATORY_CARE_PROVIDER_SITE_OTHER): Payer: Medicare Other | Admitting: *Deleted

## 2016-01-29 DIAGNOSIS — Z5181 Encounter for therapeutic drug level monitoring: Secondary | ICD-10-CM

## 2016-01-29 DIAGNOSIS — I4891 Unspecified atrial fibrillation: Secondary | ICD-10-CM | POA: Diagnosis not present

## 2016-01-29 DIAGNOSIS — I482 Chronic atrial fibrillation, unspecified: Secondary | ICD-10-CM

## 2016-01-29 LAB — POCT INR: INR: 2.9

## 2016-02-06 ENCOUNTER — Other Ambulatory Visit: Payer: Self-pay | Admitting: Cardiology

## 2016-02-25 ENCOUNTER — Other Ambulatory Visit (HOSPITAL_COMMUNITY): Payer: Self-pay | Admitting: Pulmonary Disease

## 2016-02-25 DIAGNOSIS — Z1231 Encounter for screening mammogram for malignant neoplasm of breast: Secondary | ICD-10-CM

## 2016-02-26 ENCOUNTER — Ambulatory Visit (HOSPITAL_COMMUNITY)
Admission: RE | Admit: 2016-02-26 | Discharge: 2016-02-26 | Disposition: A | Discharge status: Home / Self Care | Payer: Medicare Other | Source: Ambulatory Visit | Attending: Pulmonary Disease | Admitting: Pulmonary Disease

## 2016-02-26 DIAGNOSIS — Z1231 Encounter for screening mammogram for malignant neoplasm of breast: Secondary | ICD-10-CM

## 2016-03-09 ENCOUNTER — Ambulatory Visit (INDEPENDENT_AMBULATORY_CARE_PROVIDER_SITE_OTHER): Payer: Medicare Other | Admitting: *Deleted

## 2016-03-09 DIAGNOSIS — I4891 Unspecified atrial fibrillation: Secondary | ICD-10-CM

## 2016-03-09 DIAGNOSIS — I482 Chronic atrial fibrillation, unspecified: Secondary | ICD-10-CM

## 2016-03-09 DIAGNOSIS — Z5181 Encounter for therapeutic drug level monitoring: Secondary | ICD-10-CM | POA: Diagnosis not present

## 2016-03-09 LAB — POCT INR: INR: 3.7

## 2016-03-30 ENCOUNTER — Ambulatory Visit (INDEPENDENT_AMBULATORY_CARE_PROVIDER_SITE_OTHER): Payer: Medicare Other | Admitting: *Deleted

## 2016-03-30 DIAGNOSIS — Z5181 Encounter for therapeutic drug level monitoring: Secondary | ICD-10-CM | POA: Diagnosis not present

## 2016-03-30 DIAGNOSIS — I4891 Unspecified atrial fibrillation: Secondary | ICD-10-CM

## 2016-03-30 DIAGNOSIS — I4729 Other ventricular tachycardia: Secondary | ICD-10-CM

## 2016-03-30 DIAGNOSIS — I472 Ventricular tachycardia: Secondary | ICD-10-CM

## 2016-03-30 DIAGNOSIS — I482 Chronic atrial fibrillation, unspecified: Secondary | ICD-10-CM

## 2016-03-30 LAB — POCT INR: INR: 2.3

## 2016-03-31 NOTE — Progress Notes (Signed)
Remote ICD transmission.   

## 2016-04-03 ENCOUNTER — Encounter: Payer: Self-pay | Admitting: Cardiology

## 2016-04-03 LAB — CUP PACEART REMOTE DEVICE CHECK
Brady Statistic AP VP Percent: 90.4 %
Brady Statistic AP VS Percent: 0.01 %
Brady Statistic AS VP Percent: 9.58 %
Brady Statistic RA Percent Paced: 90.15 %
Brady Statistic RV Percent Paced: 99.88 %
HIGH POWER IMPEDANCE MEASURED VALUE: 59 Ohm
HIGH POWER IMPEDANCE MEASURED VALUE: 74 Ohm
Implantable Lead Implant Date: 20020823
Implantable Lead Model: 157
Implantable Lead Model: 4086
Lead Channel Impedance Value: 342 Ohm
Lead Channel Impedance Value: 456 Ohm
Lead Channel Pacing Threshold Amplitude: 0.875 V
Lead Channel Pacing Threshold Amplitude: 1.125 V
Lead Channel Pacing Threshold Pulse Width: 0.4 ms
Lead Channel Sensing Intrinsic Amplitude: 10.625 mV
Lead Channel Setting Pacing Amplitude: 2.75 V
Lead Channel Setting Sensing Sensitivity: 0.3 mV
MDC IDC LEAD IMPLANT DT: 20030823
MDC IDC LEAD LOCATION: 753859
MDC IDC LEAD LOCATION: 753860
MDC IDC LEAD SERIAL: 108215
MDC IDC LEAD SERIAL: 112190
MDC IDC MSMT BATTERY REMAINING LONGEVITY: 57 mo
MDC IDC MSMT BATTERY VOLTAGE: 2.95 V
MDC IDC MSMT LEADCHNL RA PACING THRESHOLD PULSEWIDTH: 0.4 ms
MDC IDC MSMT LEADCHNL RA SENSING INTR AMPL: 2.25 mV
MDC IDC MSMT LEADCHNL RA SENSING INTR AMPL: 2.25 mV
MDC IDC MSMT LEADCHNL RV IMPEDANCE VALUE: 342 Ohm
MDC IDC MSMT LEADCHNL RV SENSING INTR AMPL: 10.625 mV
MDC IDC PG IMPLANT DT: 20150608
MDC IDC SESS DTM: 20171211083525
MDC IDC SET LEADCHNL RA PACING AMPLITUDE: 2 V
MDC IDC SET LEADCHNL RV PACING PULSEWIDTH: 0.4 ms
MDC IDC STAT BRADY AS VS PERCENT: 0 %

## 2016-04-27 ENCOUNTER — Ambulatory Visit (INDEPENDENT_AMBULATORY_CARE_PROVIDER_SITE_OTHER): Payer: Medicare Other | Admitting: *Deleted

## 2016-04-27 DIAGNOSIS — Z5181 Encounter for therapeutic drug level monitoring: Secondary | ICD-10-CM

## 2016-04-27 DIAGNOSIS — I482 Chronic atrial fibrillation, unspecified: Secondary | ICD-10-CM

## 2016-04-27 DIAGNOSIS — I4891 Unspecified atrial fibrillation: Secondary | ICD-10-CM

## 2016-04-27 LAB — POCT INR: INR: 2.1

## 2016-05-01 ENCOUNTER — Other Ambulatory Visit: Payer: Self-pay | Admitting: Cardiology

## 2016-05-25 ENCOUNTER — Ambulatory Visit (INDEPENDENT_AMBULATORY_CARE_PROVIDER_SITE_OTHER): Payer: Medicare Other | Admitting: *Deleted

## 2016-05-25 DIAGNOSIS — I482 Chronic atrial fibrillation, unspecified: Secondary | ICD-10-CM

## 2016-05-25 DIAGNOSIS — Z5181 Encounter for therapeutic drug level monitoring: Secondary | ICD-10-CM

## 2016-05-25 LAB — POCT INR: INR: 2.4

## 2016-06-17 ENCOUNTER — Encounter: Payer: Self-pay | Admitting: Cardiology

## 2016-06-24 ENCOUNTER — Other Ambulatory Visit: Payer: Self-pay | Admitting: Cardiology

## 2016-07-06 ENCOUNTER — Ambulatory Visit (INDEPENDENT_AMBULATORY_CARE_PROVIDER_SITE_OTHER): Payer: Medicare Other | Admitting: *Deleted

## 2016-07-06 DIAGNOSIS — I4891 Unspecified atrial fibrillation: Secondary | ICD-10-CM

## 2016-07-06 DIAGNOSIS — I482 Chronic atrial fibrillation, unspecified: Secondary | ICD-10-CM

## 2016-07-06 DIAGNOSIS — Z5181 Encounter for therapeutic drug level monitoring: Secondary | ICD-10-CM | POA: Diagnosis not present

## 2016-07-06 LAB — POCT INR: INR: 2.4

## 2016-07-10 ENCOUNTER — Ambulatory Visit (INDEPENDENT_AMBULATORY_CARE_PROVIDER_SITE_OTHER): Payer: Medicare Other | Admitting: Internal Medicine

## 2016-07-10 ENCOUNTER — Encounter: Payer: Self-pay | Admitting: Internal Medicine

## 2016-07-10 DIAGNOSIS — I4729 Other ventricular tachycardia: Secondary | ICD-10-CM

## 2016-07-10 DIAGNOSIS — I5032 Chronic diastolic (congestive) heart failure: Secondary | ICD-10-CM

## 2016-07-10 DIAGNOSIS — I480 Paroxysmal atrial fibrillation: Secondary | ICD-10-CM

## 2016-07-10 DIAGNOSIS — I472 Ventricular tachycardia: Secondary | ICD-10-CM

## 2016-07-10 LAB — CUP PACEART INCLINIC DEVICE CHECK
Battery Remaining Longevity: 59 mo
Battery Voltage: 2.97 V
Brady Statistic AP VP Percent: 90.51 %
Brady Statistic AP VS Percent: 0.01 %
Brady Statistic RA Percent Paced: 90.2 %
Date Time Interrogation Session: 20180323101254
HIGH POWER IMPEDANCE MEASURED VALUE: 54 Ohm
HIGH POWER IMPEDANCE MEASURED VALUE: 74 Ohm
Implantable Lead Implant Date: 20030823
Implantable Lead Location: 753859
Implantable Lead Model: 157
Implantable Lead Serial Number: 112190
Lead Channel Impedance Value: 304 Ohm
Lead Channel Impedance Value: 342 Ohm
Lead Channel Pacing Threshold Amplitude: 0.875 V
Lead Channel Pacing Threshold Pulse Width: 0.4 ms
Lead Channel Pacing Threshold Pulse Width: 0.4 ms
Lead Channel Setting Sensing Sensitivity: 0.3 mV
MDC IDC LEAD IMPLANT DT: 20030823
MDC IDC LEAD LOCATION: 753860
MDC IDC LEAD SERIAL: 108215
MDC IDC MSMT LEADCHNL RA IMPEDANCE VALUE: 456 Ohm
MDC IDC MSMT LEADCHNL RA SENSING INTR AMPL: 2.5 mV
MDC IDC MSMT LEADCHNL RA SENSING INTR AMPL: 3.75 mV
MDC IDC MSMT LEADCHNL RV PACING THRESHOLD AMPLITUDE: 1 V
MDC IDC MSMT LEADCHNL RV SENSING INTR AMPL: 10.625 mV
MDC IDC MSMT LEADCHNL RV SENSING INTR AMPL: 10.625 mV
MDC IDC PG IMPLANT DT: 20150608
MDC IDC SET LEADCHNL RA PACING AMPLITUDE: 2 V
MDC IDC SET LEADCHNL RV PACING AMPLITUDE: 2.5 V
MDC IDC SET LEADCHNL RV PACING PULSEWIDTH: 0.4 ms
MDC IDC STAT BRADY AS VP PERCENT: 9.47 %
MDC IDC STAT BRADY AS VS PERCENT: 0.01 %
MDC IDC STAT BRADY RV PERCENT PACED: 99.85 %

## 2016-07-10 NOTE — Patient Instructions (Signed)
Your physician wants you to follow-up in: 1 year Dr Knox Saliva will receive a reminder letter in the mail two months in advance. If you don't receive a letter, please call our office to schedule the follow-up appointment.    Your physician recommends that you continue on your current medications as directed. Please refer to the Current Medication list given to you today.    If you need a refill on your cardiac medications before your next appointment, please call your pharmacy.    Remote monitoring is used to monitor your Pacemaker of ICD from home. This monitoring reduces the number of office visits required to check your device to one time per year. It allows [place] to keep an eye on the functioning of your device to ensure it is working properly. You are scheduled for a device check from home on 3 MONTHS You may send your transmission at any time that day. If you have a wireless device, the transmission will be sent automatically. After your physician reviews your transmission, you will receive a postcard with your next transmission date.     Thank you for choosing [facility] !

## 2016-07-10 NOTE — Progress Notes (Signed)
HPI [name] [name] returns today for followup. She is a pleasant 83 yo woman with a h/o VF, PAF, chronic diastolic heart failure, CHB, s/p ICD implant who underwent ICD generator change a couple of years ago.  She has done well in the interim, and for the most part maintained NSR. She admits to dietary indiscretion but denies medical non-compliance. She has class 2B heart failure symptoms. Allergies  Allergen Reactions  . Codeine Nausea And Vomiting  . Keflex [Cephalexin] Itching and Rash     Current Outpatient Prescriptions  Medication Sig Dispense Refill  . acetaminophen (TYLENOL) 500 MG tablet Take 500 mg by mouth daily.     [name] albuterol (PROVENTIL HFA;VENTOLIN HFA) 108 (90 BASE) MCG/ACT inhaler Inhale 2 puffs into the lungs every 6 (six) hours as needed for wheezing or shortness of breath. 1 Inhaler 2  . amLODipine (NORVASC) 5 MG tablet TAKE ONE TABLET BY MOUTH ONCE DAILY. 90 tablet 3  . atorvastatin (LIPITOR) 20 MG tablet TAKE ONE TABLET BY MOUTH ONCE DAILY. 90 tablet 3  . Calcium Carbonate-Vitamin D (CALCIUM 600 + D PO) Take 1 tablet by mouth 2 (two) times daily.    . Cholecalciferol (VITAMIN D) 2000 UNITS CAPS Take 1 capsule by mouth at bedtime.     . dofetilide (TIKOSYN) 125 MCG capsule TAKE (1) CAPSULE BY MOUTH TWICE DAILY. 035 capsule 1  . folic acid (FOLVITE) 1 MG tablet Take 1 mg by mouth 2 (two) times daily.     . furosemide (LASIX) 20 MG tablet TAKE 2 TABLETS BY MOUTH IN THE MORNING AND 1 AT LUNCH. 270 tablet 3  . furosemide (LASIX) 40 MG tablet Take one tablet by mouth every morning    . losartan (COZAAR) 100 MG tablet TAKE ONE TABLET BY MOUTH DAILY. 90 tablet 3  . metoprolol succinate (TOPROL-XL) 100 MG 24 hr tablet TAKE ONE TABLET BY MOUTH EVERY MORNING. TAKE WITH OR IMMEDIATELY FOLLOWING A MEAL. 90 tablet 3  . Polyethyl Glycol-Propyl Glycol (SYSTANE OP) Place 1 drop into both eyes daily as needed (Dry eyes).    . potassium chloride SA (K-DUR,KLOR-CON) 20 MEQ tablet Take  20 mEq by mouth 3 (three) times daily.    [name] warfarin (COUMADIN) 2.5 MG tablet Take as directed by Coumadin Clinic 45 tablet 3   No current facility-administered medications for this visit.      Past Medical History:  Diagnosis Date  . Atrial fibrillation ([place])    a. anticoagulated with Warfarin  . Chronic diastolic heart failure ([place])   . Complete heart block ([place])   . Coronary atherosclerosis of native coronary artery    Multivessel status post CABG, DES PLA March 2006  . Essential hypertension, benign   . Mixed hyperlipidemia   . Osteoarthritis   . Ventricular fibrillation ([place]) 2003   a. seen on PPM interrogation a/w syncope    ROS:   All systems reviewed and negative except as noted in the HPI.   Past Surgical History:  Procedure Laterality Date  . CARDIAC DEFIBRILLATOR PLACEMENT     MDT dual chamber ICD  . CARDIOVERSION N/A 08/09/2014   Procedure: CARDIOVERSION;  Surgeon: Sanda Klein, MD;  Location: [facility];  Service: Cardiovascular;  Laterality: N/A;  . CORONARY ARTERY BYPASS GRAFT     LIMA to LAD, SVG to diagonal, SVG to ramus and OM  . IMPLANTABLE CARDIOVERTER DEFIBRILLATOR GENERATOR CHANGE N/A 09/25/2013   Procedure: IMPLANTABLE CARDIOVERTER DEFIBRILLATOR GENERATOR CHANGE;  Surgeon: Evans Lance, MD;  Location: [facility] CATH LAB;  Service: Cardiovascular;  Laterality: N/A;  . LEAD REVISION N/A 09/29/2013   Procedure: LEAD REVISION;  Surgeon: Deboraha Sprang, MD;  Location: [facility] CATH LAB;  Service: Cardiovascular;  Laterality: N/A;  . TONSILLECTOMY    . YAG LASER APPLICATION Left 12/22/8544   Procedure: YAG LASER APPLICATION;  Surgeon: Elta Guadeloupe T. Gershon Crane, MD;  Location: [facility];  Service: Ophthalmology;  Laterality: Left;  . YAG LASER APPLICATION Right 2/70/3500   Procedure: YAG LASER APPLICATION;  Surgeon: Elta Guadeloupe T. Gershon Crane, MD;  Location: [facility];  Service: Ophthalmology;  Laterality: Right;     Family History  Problem Relation Age of Onset  . Heart attack Father   .  Heart attack Brother      Social History   Social History  . Marital status: Widowed    Spouse name: N/A  . Number of children: 2  . Years of education: N/A   Occupational History  . Retired     Equities trader  .  Retired   Social History Main Topics  . Smoking status: Never Smoker  . Smokeless tobacco: Never Used  . Alcohol use No  . Drug use: No  . Sexual activity: No   Other Topics Concern  . Not on file   Social History Narrative  . No narrative on file     BP 134/68   Pulse 81   Ht 5' (1.524 m)   Wt 170 lb (77.1 kg)   SpO2 98%   BMI 33.20 kg/m   Physical Exam:  stable appearing 83 yo woman,NAD HEENT: Unremarkable Neck:  7 cm JVD, no thyromegally Back:  No CVA tenderness Lungs:  Clear with no wheezes HEART:  Regular rate rhythm, no murmurs, no rubs, no clicks Abd:  soft, positive bowel sounds, no organomegally, no rebound, no guarding Ext:  2 plus pulses, right leg with some edema, no cyanosis, no clubbing Skin:  No rashes no nodules Neuro:  CN II through XII intact, motor grossly intact    DEVICE  Normal device function.  See PaceArt for details.   Assess/Plan: 1. Atrial fib - she is in atrial fib today but has been maintaining NSR since her DCCV 11 months ago almost 99% of the time. No change in her meds. 2. Chronic diastolic heart failure - she is class 2. She is trying to do better on her salt intake. I have encouraged her to take additional lasix if she starts to swell or gets sob. 3. HTN - her blood pressure is well controlled today.  4. VT/VF - she has had none. Will follow.   [name] [name], M.D.

## 2016-07-15 ENCOUNTER — Ambulatory Visit: Payer: Medicare Other | Admitting: Cardiology

## 2016-08-07 ENCOUNTER — Ambulatory Visit: Payer: Medicare Other | Admitting: Cardiology

## 2016-08-17 ENCOUNTER — Other Ambulatory Visit (HOSPITAL_COMMUNITY)
Admission: RE | Admit: 2016-08-17 | Discharge: 2016-08-17 | Disposition: A | Discharge status: Home / Self Care | Payer: Medicare Other | Source: Ambulatory Visit | Attending: Cardiology | Admitting: Cardiology

## 2016-08-17 ENCOUNTER — Ambulatory Visit (INDEPENDENT_AMBULATORY_CARE_PROVIDER_SITE_OTHER): Payer: Medicare Other | Admitting: *Deleted

## 2016-08-17 DIAGNOSIS — Z5181 Encounter for therapeutic drug level monitoring: Secondary | ICD-10-CM | POA: Diagnosis not present

## 2016-08-17 DIAGNOSIS — I482 Chronic atrial fibrillation, unspecified: Secondary | ICD-10-CM

## 2016-08-17 DIAGNOSIS — I4891 Unspecified atrial fibrillation: Secondary | ICD-10-CM

## 2016-08-17 LAB — PROTIME-INR
INR: 4.38
PROTHROMBIN TIME: 43 s — AB (ref 11.4–15.2)

## 2016-08-17 LAB — POCT INR: INR: 6.4

## 2016-08-24 ENCOUNTER — Ambulatory Visit (INDEPENDENT_AMBULATORY_CARE_PROVIDER_SITE_OTHER): Payer: Medicare Other | Admitting: *Deleted

## 2016-08-24 ENCOUNTER — Other Ambulatory Visit (HOSPITAL_COMMUNITY)
Admission: RE | Admit: 2016-08-24 | Discharge: 2016-08-24 | Disposition: A | Discharge status: Home / Self Care | Payer: Medicare Other | Source: Ambulatory Visit | Attending: Cardiology | Admitting: Cardiology

## 2016-08-24 DIAGNOSIS — I482 Chronic atrial fibrillation, unspecified: Secondary | ICD-10-CM

## 2016-08-24 DIAGNOSIS — I4891 Unspecified atrial fibrillation: Secondary | ICD-10-CM | POA: Diagnosis not present

## 2016-08-24 DIAGNOSIS — Z79899 Other long term (current) drug therapy: Secondary | ICD-10-CM | POA: Insufficient documentation

## 2016-08-24 DIAGNOSIS — Z5181 Encounter for therapeutic drug level monitoring: Secondary | ICD-10-CM

## 2016-08-24 LAB — BASIC METABOLIC PANEL
ANION GAP: 9 (ref 5–15)
BUN: 23 mg/dL — ABNORMAL HIGH (ref 6–20)
CALCIUM: 9.8 mg/dL (ref 8.9–10.3)
CO2: 28 mmol/L (ref 22–32)
CREATININE: 1.18 mg/dL — AB (ref 0.44–1.00)
Chloride: 106 mmol/L (ref 101–111)
GFR calc non Af Amer: 42 mL/min — ABNORMAL LOW (ref >60)
GFR, EST AFRICAN AMERICAN: 48 mL/min — AB (ref >60)
Glucose, Bld: 89 mg/dL (ref 65–99)
Potassium: 4.4 mmol/L (ref 3.5–5.1)
SODIUM: 143 mmol/L (ref 135–145)

## 2016-08-24 LAB — POCT INR: INR: 2

## 2016-08-24 LAB — MAGNESIUM: MAGNESIUM: 2 mg/dL (ref 1.7–2.4)

## 2016-08-27 NOTE — Progress Notes (Signed)
Cardiology Office Note  Date: 08/28/2016   ID: Katlynn, Naser Sep 18, 1931, MRN [id number]  PCP: Sinda Du, MD  Primary Cardiologist: Rozann Lesches, MD   Chief Complaint  Patient presents with  . Coronary Artery Disease  . PAF    History of Present Illness: [name] [name] is an 83 y.o. female last seen in August 2017. She presents overdue for a routine follow-up visit. Tells me that she has been doing well from a cardiac perspective. No significant palpitations or progressive shortness of breath. She has had no angina symptoms.  She continues to follow in the anticoagulation on Coumadin. Recent INR was 2.0. She has had no spontaneous bleeding problems.  She follows in the device clinic with Dr. Lovena Le, Medtronic ICD in place. I personally reviewed her ECG today which shows dual-chamber pacing.  Her last echocardiogram was in 2015 as outlined below.  Past Medical History:  Diagnosis Date  . Atrial fibrillation ([place])    a. anticoagulated with Warfarin  . Chronic diastolic heart failure ([place])   . Complete heart block ([place])   . Coronary atherosclerosis of native coronary artery    Multivessel status post CABG, DES PLA March 2006  . Essential hypertension, benign   . Mixed hyperlipidemia   . Osteoarthritis   . Ventricular fibrillation ([place]) 2003   a. seen on PPM interrogation a/w syncope    Past Surgical History:  Procedure Laterality Date  . CARDIAC DEFIBRILLATOR PLACEMENT     MDT dual chamber ICD  . CARDIOVERSION N/A 08/09/2014   Procedure: CARDIOVERSION;  Surgeon: Sanda Klein, MD;  Location: [facility];  Service: Cardiovascular;  Laterality: N/A;  . CORONARY ARTERY BYPASS GRAFT     LIMA to LAD, SVG to diagonal, SVG to ramus and OM  . IMPLANTABLE CARDIOVERTER DEFIBRILLATOR GENERATOR CHANGE N/A 09/25/2013   Procedure: IMPLANTABLE CARDIOVERTER DEFIBRILLATOR GENERATOR CHANGE;  Surgeon: Evans Lance, MD;  Location: [facility] CATH LAB;  Service: Cardiovascular;   Laterality: N/A;  . LEAD REVISION N/A 09/29/2013   Procedure: LEAD REVISION;  Surgeon: Deboraha Sprang, MD;  Location: [facility] CATH LAB;  Service: Cardiovascular;  Laterality: N/A;  . TONSILLECTOMY    . YAG LASER APPLICATION Left 06/24/6281   Procedure: YAG LASER APPLICATION;  Surgeon: Elta Guadeloupe T. Gershon Crane, MD;  Location: [facility] ORS;  Service: Ophthalmology;  Laterality: Left;  . YAG LASER APPLICATION Right 1/51/7616   Procedure: YAG LASER APPLICATION;  Surgeon: Elta Guadeloupe T. Gershon Crane, MD;  Location: [facility] ORS;  Service: Ophthalmology;  Laterality: Right;    Current Outpatient Prescriptions  Medication Sig Dispense Refill  . acetaminophen (TYLENOL) 500 MG tablet Take 500 mg by mouth daily.     [name] albuterol (PROVENTIL HFA;VENTOLIN HFA) 108 (90 BASE) MCG/ACT inhaler Inhale 2 puffs into the lungs every 6 (six) hours as needed for wheezing or shortness of breath. 1 Inhaler 2  . amLODipine (NORVASC) 5 MG tablet TAKE ONE TABLET BY MOUTH ONCE DAILY. 90 tablet 3  . atorvastatin (LIPITOR) 20 MG tablet TAKE ONE TABLET BY MOUTH ONCE DAILY. 90 tablet 3  . Calcium Carbonate-Vitamin D (CALCIUM 600 + D PO) Take 1 tablet by mouth 2 (two) times daily.    . Cholecalciferol (VITAMIN D) 2000 UNITS CAPS Take 1 capsule by mouth at bedtime.     . dofetilide (TIKOSYN) 125 MCG capsule TAKE (1) CAPSULE BY MOUTH TWICE DAILY. 073 capsule 1  . folic acid (FOLVITE) 1 MG tablet Take 1 mg by mouth 2 (two) times daily.     [name]  furosemide (LASIX) 20 MG tablet TAKE 2 TABLETS BY MOUTH IN THE MORNING AND 1 AT LUNCH. 270 tablet 3  . furosemide (LASIX) 40 MG tablet Take one tablet by mouth every morning    . losartan (COZAAR) 100 MG tablet TAKE ONE TABLET BY MOUTH DAILY. 90 tablet 3  . metoprolol succinate (TOPROL-XL) 100 MG 24 hr tablet TAKE ONE TABLET BY MOUTH EVERY MORNING. TAKE WITH OR IMMEDIATELY FOLLOWING A MEAL. 90 tablet 3  . Polyethyl Glycol-Propyl Glycol (SYSTANE OP) Place 1 drop into both eyes daily as needed (Dry eyes).    . potassium chloride SA  (K-DUR,KLOR-CON) 20 MEQ tablet Take 20 mEq by mouth 3 (three) times daily.    [name] warfarin (COUMADIN) 2.5 MG tablet Take as directed by Coumadin Clinic 45 tablet 3   No current facility-administered medications for this visit.    Allergies:  Codeine and Keflex [cephalexin]   Social History: The patient  reports that she has never smoked. She has never used smokeless tobacco. She reports that she does not drink alcohol or use drugs.   ROS:  Please see the history of present illness. Otherwise, complete review of systems is positive for NYHA class II dyspnea.  All other systems are reviewed and negative.   Physical Exam: VS:  BP (!) 130/58   Pulse 85   Ht 5' (1.524 m)   Wt 169 lb (76.7 kg)   SpO2 92%   BMI 33.01 kg/m , BMI Body mass index is 33.01 kg/m.  Wt Readings from Last 3 Encounters:  08/28/16 169 lb (76.7 kg)  07/10/16 170 lb (77.1 kg)  12/12/15 168 lb (76.2 kg)    Elderly woman, appears comfortable at rest.  HEENT: Conjunctiva and lids normal, oropharynx clear. Neck: Supple, no elevated JVP or carotid bruits, no thyromegaly.  Lungs: Clear to auscultation, nonlabored breathing at rest.  Cardiac: Regular rate and rhythm, no S3, soft systolic murmur, no pericardial rub.  Thorax: Well-healed device pocket site. Abdomen: Soft, nontender, bowel sounds present.  Extremities: Trace ankle edema, distal pulses 2+.  Skin: Warm and dry.Scattered ecchymoses on the arms. Musculoskeletal: No kyphosis. Neuropsychiatric: Alert and oriented 3, affect appropriate.  ECG: I personally reviewed the tracing from 04/25/2015 which showed dual-chamber pacing.  Recent Labwork: 09/20/2015: Hemoglobin 13.2; Platelets 186 08/24/2016: BUN 23; Creatinine, Ser 1.18; Magnesium 2.0; Potassium 4.4; Sodium 143   Other Studies Reviewed Today:  Echocardiogram 09/13/2013: Study Conclusions  - Left ventricle: The cavity size was normal. Wall thickness was increased in a pattern of mild LVH. Systolic  function was normal. The estimated ejection fraction was in the range of 60% to 65%. Wall motion was normal; there were no regional wall motion abnormalities. Features are consistent with a pseudonormal left ventricular filling pattern, with concomitant abnormal relaxation and increased filling pressure (grade 2 diastolic dysfunction). Doppler parameters are consistent with elevated ventricular end-diastolic filling pressure. - Aortic valve: Mildly calcified annulus. Trileaflet; mildly calcified leaflets. There was very mild stenosis. There was no significant regurgitation. Mean gradient (S): 9 mm Hg. Valve area (VTI): 1.52 cm^2. Valve area (Vmax): 1.71 cm^2. - Mitral valve: Calcified annulus. Mildly thickened leaflets . There was mild regurgitation. - Left atrium: The atrium was mildly to moderately dilated. - Right ventricle: The cavity size was mildly dilated. Pacer wire or catheter noted in right ventricle. - Right atrium: Central venous pressure (est): 3 mm Hg. - Atrial septum: No defect or patent foramen ovale was identified. - Tricuspid valve: There was mild regurgitation. -  Pulmonary arteries: PA peak pressure: 42 mm Hg (S). - Pericardium, extracardiac: There was no pericardial effusion.  Impressions:  - Mild LVH with LVEF 93-23%, grade 2 diastolic dysfunction with increased filling pressures. MIld to moderate left atrial enlargement. Mild mitral regurgitation. Very mildly stenotic aortic valve. Device wire noted in right heart. Mild tricuspid regurgitation with PASP 42 mmHg.  Lexiscan Cardiolite 06/15/2014: FINDINGS: Pharmacological stress  Baseline EKG shows ventricular pacing. After injection heart rate increased from 61 beats per min up to 75 beats per min, and blood pressure decreased from 139/76 and 113/66. The test was stopped after injection was complete, the patient did not experience any chest pain. Post-injection EKG showed no  significant arrhythmias, findings not interpretable for ischemia because she was ventricular paced throughout the study.  Perfusion: No decreased activity in the left ventricle on stress imaging to suggest reversible ischemia or infarction.  Wall Motion: Normal left ventricular wall motion. No left ventricular dilation.  Left Ventricular Ejection Fraction: 77 %  End diastolic volume 55 ml  End systolic volume 12 ml  IMPRESSION: 1. No reversible ischemia or infarction.  2. Normal left ventricular wall motion.  3. Left ventricular ejection fraction 77%  4. Low-risk stress test findings*.  Assessment and Plan:  1. Paroxysmal atrial fibrillation, well-controlled on Tikosyn and Toprol-XL. She continues on Coumadin for stroke prophylaxis. I reviewed her recent lab work which is outlined above and stable.  2. Multivessel CAD status post CABG and DES to the PLA. She has no active angina symptoms at this time and had a low risk Myoview in 2016. Continue observation on medical therapy.  3. Essential hypertension, blood pressure is adequately controlled today.  4. Medtronic ICD in place, followed by Dr. Lovena Le. No device shocks.  5. Chronic diastolic heart failure, weight is stable. No significant volume overload.  Current medicines were reviewed with the patient today.   Orders Placed This Encounter  Procedures  . EKG 12-Lead    Disposition: Follow-up in 6 months.  Signed, Satira Sark, MD, [facility] 08/28/2016 11:34 AM    [place] [facility] [place]. [address], [place], [place] [address] Phone: [phone]; Fax: [phone]

## 2016-08-28 ENCOUNTER — Ambulatory Visit (INDEPENDENT_AMBULATORY_CARE_PROVIDER_SITE_OTHER): Payer: Medicare Other | Admitting: Cardiology

## 2016-08-28 ENCOUNTER — Encounter: Payer: Self-pay | Admitting: Cardiology

## 2016-08-28 VITALS — BP 130/58 | HR 85 | Ht 60.0 in | Wt 169.0 lb

## 2016-08-28 DIAGNOSIS — I5032 Chronic diastolic (congestive) heart failure: Secondary | ICD-10-CM | POA: Diagnosis not present

## 2016-08-28 DIAGNOSIS — Z95810 Presence of automatic (implantable) cardiac defibrillator: Secondary | ICD-10-CM | POA: Diagnosis not present

## 2016-08-28 DIAGNOSIS — I2510 Atherosclerotic heart disease of native coronary artery without angina pectoris: Secondary | ICD-10-CM | POA: Diagnosis not present

## 2016-08-28 DIAGNOSIS — I10 Essential (primary) hypertension: Secondary | ICD-10-CM | POA: Diagnosis not present

## 2016-08-28 DIAGNOSIS — I480 Paroxysmal atrial fibrillation: Secondary | ICD-10-CM

## 2016-08-28 NOTE — Patient Instructions (Signed)

## 2016-09-21 ENCOUNTER — Ambulatory Visit (INDEPENDENT_AMBULATORY_CARE_PROVIDER_SITE_OTHER): Payer: Medicare Other | Admitting: *Deleted

## 2016-09-21 DIAGNOSIS — I482 Chronic atrial fibrillation, unspecified: Secondary | ICD-10-CM

## 2016-09-21 DIAGNOSIS — Z5181 Encounter for therapeutic drug level monitoring: Secondary | ICD-10-CM

## 2016-09-21 LAB — POCT INR: INR: 2.2

## 2016-09-26 ENCOUNTER — Other Ambulatory Visit: Payer: Self-pay | Admitting: Cardiology

## 2016-10-12 ENCOUNTER — Encounter: Payer: Medicare Other | Admitting: *Deleted

## 2016-10-16 ENCOUNTER — Encounter: Payer: Self-pay | Admitting: Cardiology

## 2016-10-19 ENCOUNTER — Ambulatory Visit (INDEPENDENT_AMBULATORY_CARE_PROVIDER_SITE_OTHER): Payer: Medicare Other | Admitting: *Deleted

## 2016-10-19 DIAGNOSIS — I472 Ventricular tachycardia: Secondary | ICD-10-CM

## 2016-10-19 DIAGNOSIS — I5032 Chronic diastolic (congestive) heart failure: Secondary | ICD-10-CM

## 2016-10-19 DIAGNOSIS — Z5181 Encounter for therapeutic drug level monitoring: Secondary | ICD-10-CM | POA: Diagnosis not present

## 2016-10-19 DIAGNOSIS — I4729 Other ventricular tachycardia: Secondary | ICD-10-CM

## 2016-10-19 DIAGNOSIS — I480 Paroxysmal atrial fibrillation: Secondary | ICD-10-CM | POA: Diagnosis not present

## 2016-10-19 LAB — POCT INR: INR: 2.4

## 2016-10-19 NOTE — Progress Notes (Signed)
Remote ICD transmission.   

## 2016-10-20 ENCOUNTER — Encounter: Payer: Self-pay | Admitting: Cardiology

## 2016-10-20 LAB — CUP PACEART REMOTE DEVICE CHECK
Battery Remaining Longevity: 47 mo
Brady Statistic AP VS Percent: 0 %
Brady Statistic AS VS Percent: 0.02 %
Brady Statistic RA Percent Paced: 81.34 %
Brady Statistic RV Percent Paced: 99.9 %
Date Time Interrogation Session: 20180702162034
HIGH POWER IMPEDANCE MEASURED VALUE: 56 Ohm
HIGH POWER IMPEDANCE MEASURED VALUE: 73 Ohm
Implantable Lead Implant Date: 20030823
Implantable Lead Location: 753859
Implantable Lead Location: 753860
Implantable Lead Model: 4086
Implantable Lead Serial Number: 112190
Implantable Pulse Generator Implant Date: 20150608
Lead Channel Impedance Value: 304 Ohm
Lead Channel Impedance Value: 342 Ohm
Lead Channel Impedance Value: 475 Ohm
Lead Channel Pacing Threshold Amplitude: 0.875 V
Lead Channel Pacing Threshold Pulse Width: 0.4 ms
Lead Channel Pacing Threshold Pulse Width: 0.4 ms
Lead Channel Sensing Intrinsic Amplitude: 10.625 mV
Lead Channel Setting Pacing Amplitude: 2 V
Lead Channel Setting Pacing Pulse Width: 0.4 ms
Lead Channel Setting Sensing Sensitivity: 0.3 mV
MDC IDC LEAD IMPLANT DT: 20030823
MDC IDC LEAD SERIAL: 108215
MDC IDC MSMT BATTERY VOLTAGE: 2.97 V
MDC IDC MSMT LEADCHNL RA SENSING INTR AMPL: 3.125 mV
MDC IDC MSMT LEADCHNL RA SENSING INTR AMPL: 3.125 mV
MDC IDC MSMT LEADCHNL RV PACING THRESHOLD AMPLITUDE: 1.125 V
MDC IDC MSMT LEADCHNL RV SENSING INTR AMPL: 10.625 mV
MDC IDC SET LEADCHNL RV PACING AMPLITUDE: 2.75 V
MDC IDC STAT BRADY AP VP PERCENT: 81.89 %
MDC IDC STAT BRADY AS VP PERCENT: 18.09 %

## 2016-10-30 ENCOUNTER — Other Ambulatory Visit: Payer: Self-pay | Admitting: Cardiology

## 2016-12-07 ENCOUNTER — Ambulatory Visit (INDEPENDENT_AMBULATORY_CARE_PROVIDER_SITE_OTHER): Payer: Medicare Other | Admitting: *Deleted

## 2016-12-07 DIAGNOSIS — Z5181 Encounter for therapeutic drug level monitoring: Secondary | ICD-10-CM

## 2016-12-07 DIAGNOSIS — I480 Paroxysmal atrial fibrillation: Secondary | ICD-10-CM

## 2016-12-07 LAB — POCT INR: INR: 2.6

## 2017-01-18 ENCOUNTER — Ambulatory Visit (INDEPENDENT_AMBULATORY_CARE_PROVIDER_SITE_OTHER): Payer: Medicare Other | Admitting: *Deleted

## 2017-01-18 DIAGNOSIS — I4891 Unspecified atrial fibrillation: Secondary | ICD-10-CM | POA: Diagnosis not present

## 2017-01-18 DIAGNOSIS — I480 Paroxysmal atrial fibrillation: Secondary | ICD-10-CM

## 2017-01-18 DIAGNOSIS — I472 Ventricular tachycardia: Secondary | ICD-10-CM

## 2017-01-18 DIAGNOSIS — Z5181 Encounter for therapeutic drug level monitoring: Secondary | ICD-10-CM | POA: Diagnosis not present

## 2017-01-18 DIAGNOSIS — I5032 Chronic diastolic (congestive) heart failure: Secondary | ICD-10-CM | POA: Diagnosis not present

## 2017-01-18 DIAGNOSIS — I4729 Other ventricular tachycardia: Secondary | ICD-10-CM

## 2017-01-18 LAB — POCT INR: INR: 2.2

## 2017-01-19 NOTE — Progress Notes (Signed)
Remote ICD transmission.   

## 2017-01-20 ENCOUNTER — Other Ambulatory Visit: Payer: Self-pay | Admitting: Cardiology

## 2017-01-20 ENCOUNTER — Encounter: Payer: Self-pay | Admitting: Cardiology

## 2017-01-21 LAB — CUP PACEART REMOTE DEVICE CHECK
Battery Remaining Longevity: 48 mo
Brady Statistic AP VP Percent: 85.04 %
Brady Statistic AP VS Percent: 0 %
Brady Statistic AS VS Percent: 0.02 %
Brady Statistic RV Percent Paced: 99.87 %
HIGH POWER IMPEDANCE MEASURED VALUE: 72 Ohm
HighPow Impedance: 58 Ohm
Implantable Lead Location: 753860
Implantable Lead Model: 4086
Implantable Lead Serial Number: 108215
Implantable Pulse Generator Implant Date: 20150608
Lead Channel Impedance Value: 475 Ohm
Lead Channel Pacing Threshold Pulse Width: 0.4 ms
Lead Channel Sensing Intrinsic Amplitude: 10.625 mV
Lead Channel Sensing Intrinsic Amplitude: 10.625 mV
Lead Channel Sensing Intrinsic Amplitude: 3.5 mV
Lead Channel Setting Pacing Amplitude: 2 V
Lead Channel Setting Pacing Amplitude: 2.5 V
Lead Channel Setting Pacing Pulse Width: 0.4 ms
MDC IDC LEAD IMPLANT DT: 20030823
MDC IDC LEAD IMPLANT DT: 20030823
MDC IDC LEAD LOCATION: 753859
MDC IDC LEAD SERIAL: 112190
MDC IDC MSMT BATTERY VOLTAGE: 2.97 V
MDC IDC MSMT LEADCHNL RA PACING THRESHOLD AMPLITUDE: 0.875 V
MDC IDC MSMT LEADCHNL RA PACING THRESHOLD PULSEWIDTH: 0.4 ms
MDC IDC MSMT LEADCHNL RA SENSING INTR AMPL: 3.5 mV
MDC IDC MSMT LEADCHNL RV IMPEDANCE VALUE: 342 Ohm
MDC IDC MSMT LEADCHNL RV IMPEDANCE VALUE: 342 Ohm
MDC IDC MSMT LEADCHNL RV PACING THRESHOLD AMPLITUDE: 1 V
MDC IDC SESS DTM: 20181001102206
MDC IDC SET LEADCHNL RV SENSING SENSITIVITY: 0.3 mV
MDC IDC STAT BRADY AS VP PERCENT: 14.94 %
MDC IDC STAT BRADY RA PERCENT PACED: 83.97 %

## 2017-03-02 NOTE — Progress Notes (Signed)
Cardiology Office Note  Date: 03/03/2017   ID: [name] [name], [name] [name] 02-Nov-1931, MRN [id number]  PCP: Sinda Du, MD  Primary Cardiologist: Rozann Lesches, MD   Chief Complaint  Patient presents with  . Atrial Fibrillation    History of Present Illness: [name] [name] is an 83 y.o. female last seen in May.  She presents for a routine follow-up visit.  She reports stable, chronic dyspnea on exertion, no progression in symptoms as she was experiencing with more frequent atrial fibrillation in the past.  She denies chest pain.  She continues on Coumadin with follow-up in the anticoagulation clinic.  She has had no spontaneous bleeding problems.  Last INR was 2.2.  Follow-up pending today.  She follows with Dr. Lovena Le in the device clinic, Medtronic ICD in place.  Recent device interrogation showed normal function with 11.4% AT/AF burden.  She continues on Tikosyn.  She is also following with Dr. Luan Pulling.  She reports no other major health changes.  I reviewed her medications.  Past Medical History:  Diagnosis Date  . Atrial fibrillation ([place])    a. anticoagulated with Warfarin  . Chronic diastolic heart failure ([place])   . Complete heart block ([place])   . Coronary atherosclerosis of native coronary artery    Multivessel status post CABG, DES PLA March 2006  . Essential hypertension, benign   . Mixed hyperlipidemia   . Osteoarthritis   . Ventricular fibrillation ([place]) 2003   a. seen on PPM interrogation a/w syncope    Past Surgical History:  Procedure Laterality Date  . CARDIAC DEFIBRILLATOR PLACEMENT     MDT dual chamber ICD  . CORONARY ARTERY BYPASS GRAFT     LIMA to LAD, SVG to diagonal, SVG to ramus and OM  . TONSILLECTOMY      Current Outpatient Medications  Medication Sig Dispense Refill  . acetaminophen (TYLENOL) 500 MG tablet Take 500 mg by mouth daily.     [name] albuterol (PROVENTIL HFA;VENTOLIN HFA) 108 (90 BASE) MCG/ACT inhaler Inhale 2 puffs into the lungs every  6 (six) hours as needed for wheezing or shortness of breath. 1 Inhaler 2  . amLODipine (NORVASC) 5 MG tablet TAKE ONE TABLET BY MOUTH ONCE DAILY. 90 tablet 3  . atorvastatin (LIPITOR) 20 MG tablet TAKE ONE TABLET BY MOUTH ONCE DAILY. 90 tablet 3  . Calcium Carbonate-Vitamin D (CALCIUM 600 + D PO) Take 1 tablet by mouth 2 (two) times daily.    . Cholecalciferol (VITAMIN D) 2000 UNITS CAPS Take 1 capsule by mouth at bedtime.     . dofetilide (TIKOSYN) 125 MCG capsule TAKE (1) CAPSULE BY MOUTH TWICE DAILY. 517 capsule 1  . folic acid (FOLVITE) 1 MG tablet Take 1 mg by mouth 2 (two) times daily.     . furosemide (LASIX) 40 MG tablet Take one tablet by mouth every morning    . losartan (COZAAR) 100 MG tablet TAKE ONE TABLET BY MOUTH DAILY. 90 tablet 0  . metoprolol succinate (TOPROL-XL) 100 MG 24 hr tablet TAKE ONE TABLET BY MOUTH EVERY MORNING. TAKE WITH OR IMMEDIATELY FOLLOWING A MEAL. 90 tablet 3  . Polyethyl Glycol-Propyl Glycol (SYSTANE OP) Place 1 drop into both eyes daily as needed (Dry eyes).    . potassium chloride SA (K-DUR,KLOR-CON) 20 MEQ tablet Take 20 mEq by mouth 3 (three) times daily.    [name] warfarin (COUMADIN) 2.5 MG tablet TAKE AS DIRECTED PER MD. 45 tablet 6   No current facility-administered medications for this  visit.    Allergies:  Codeine and Keflex [cephalexin]   Social History: The patient  reports that  has never smoked. she has never used smokeless tobacco. She reports that she does not drink alcohol or use drugs.   ROS:  Please see the history of present illness. Otherwise, complete review of systems is positive for fatigue.  All other systems are reviewed and negative.   Physical Exam: VS:  BP (!) 164/74 (BP Location: Right Arm)   Pulse 86   Ht 5\' 2"  (1.575 m)   Wt 172 lb (78 kg)   SpO2 93%   BMI 31.46 kg/m , BMI Body mass index is 31.46 kg/m.  Wt Readings from Last 3 Encounters:  03/03/17 172 lb (78 kg)  08/28/16 169 lb (76.7 kg)  07/10/16 170 lb (77.1 kg)     General: Elderly woman, appears comfortable at rest. HEENT: Conjunctiva and lids normal, oropharynx clear. Neck: Supple, no elevated JVP or carotid bruits, no thyromegaly. Lungs: Clear to auscultation, nonlabored breathing at rest. Cardiac: Regular rate and rhythm, no S3, 2/6 systolic murmur, no pericardial rub. Abdomen: Soft, nontender, bowel sounds present, no guarding or rebound. Extremities: Mild ankle edema, distal pulses 2+. Skin: Warm and dry. Musculoskeletal: No kyphosis. Neuropsychiatric: Alert and oriented x3, affect grossly appropriate.  ECG: I personally reviewed the tracing from 08/28/2016 which showed a dual-chamber paced rhythm.  Recent Labwork: 08/24/2016: BUN 23; Creatinine, Ser 1.18; Magnesium 2.0; Potassium 4.4; Sodium 143     Component Value Date/Time   CHOL 111 01/24/2013 0946   TRIG 119 01/24/2013 0946   HDL 31 (L) 01/24/2013 0946   CHOLHDL 3.6 01/24/2013 0946   VLDL 24 01/24/2013 0946   LDLCALC 56 01/24/2013 0946    Other Studies Reviewed Today:  Echocardiogram 09/13/2013: Study Conclusions  - Left ventricle: The cavity size was normal. Wall thickness was increased in a pattern of mild LVH. Systolic function was normal. The estimated ejection fraction was in the range of 60% to 65%. Wall motion was normal; there were no regional wall motion abnormalities. Features are consistent with a pseudonormal left ventricular filling pattern, with concomitant abnormal relaxation and increased filling pressure (grade 2 diastolic dysfunction). Doppler parameters are consistent with elevated ventricular end-diastolic filling pressure. - Aortic valve: Mildly calcified annulus. Trileaflet; mildly calcified leaflets. There was very mild stenosis. There was no significant regurgitation. Mean gradient (S): 9 mm Hg. Valve area (VTI): 1.52 cm^2. Valve area (Vmax): 1.71 cm^2. - Mitral valve: Calcified annulus. Mildly thickened leaflets . There was  mild regurgitation. - Left atrium: The atrium was mildly to moderately dilated. - Right ventricle: The cavity size was mildly dilated. Pacer wire or catheter noted in right ventricle. - Right atrium: Central venous pressure (est): 3 mm Hg. - Atrial septum: No defect or patent foramen ovale was identified. - Tricuspid valve: There was mild regurgitation. - Pulmonary arteries: PA peak pressure: 42 mm Hg (S). - Pericardium, extracardiac: There was no pericardial effusion.  Impressions:  - Mild LVH with LVEF 34-74%, grade 2 diastolic dysfunction with increased filling pressures. MIld to moderate left atrial enlargement. Mild mitral regurgitation. Very mildly stenotic aortic valve. Device wire noted in right heart. Mild tricuspid regurgitation with PASP 42 mmHg.  Lexiscan Cardiolite 06/15/2014: FINDINGS: Pharmacological stress  Baseline EKG shows ventricular pacing. After injection heart rate increased from 61 beats per min up to 75 beats per min, and blood pressure decreased from 139/76 and 113/66. The test was stopped after injection was complete, the patient  did not experience any chest pain. Post-injection EKG showed no significant arrhythmias, findings not interpretable for ischemia because she was ventricular paced throughout the study.  Perfusion: No decreased activity in the left ventricle on stress imaging to suggest reversible ischemia or infarction.  Wall Motion: Normal left ventricular wall motion. No left ventricular dilation.  Left Ventricular Ejection Fraction: 77 %  End diastolic volume 55 ml  End systolic volume 12 ml  IMPRESSION: 1. No reversible ischemia or infarction.  2. Normal left ventricular wall motion.  3. Left ventricular ejection fraction 77%  4. Low-risk stress test findings*.  Assessment and Plan:  1.  Paroxysmal atrial fibrillation with relatively good symptom control, approximately 11% AF/AT burden based on device  interrogation.  She continues on Tikosyn and Toprol-XL.  Maintaining follow-up in the anticoagulation clinic on Coumadin.  2.  CABG and a DES to the PLA.  Ischemic testing from 2016 was low risk as outlined above.  No obvious angina symptoms.  3.  Medtronic ICD in place, continues to follow with Dr. Lovena Le.  4.  Essential hypertension, no changes made to present regimen.  Keep follow-up with Dr. Luan Pulling.  Current medicines were reviewed with the patient today.  Disposition: Follow-up in 6 months.  Signed, Satira Sark, MD, [facility] 03/03/2017 2:11 PM    [facility] at [facility] [address], [place], [place] [address] Phone: [phone]; Fax: [phone]

## 2017-03-03 ENCOUNTER — Ambulatory Visit: Payer: Medicare Other | Admitting: Cardiology

## 2017-03-03 ENCOUNTER — Ambulatory Visit (INDEPENDENT_AMBULATORY_CARE_PROVIDER_SITE_OTHER): Payer: Medicare Other | Admitting: *Deleted

## 2017-03-03 ENCOUNTER — Encounter: Payer: Self-pay | Admitting: Cardiology

## 2017-03-03 VITALS — BP 164/74 | HR 86 | Ht 62.0 in | Wt 172.0 lb

## 2017-03-03 DIAGNOSIS — I25119 Atherosclerotic heart disease of native coronary artery with unspecified angina pectoris: Secondary | ICD-10-CM | POA: Diagnosis not present

## 2017-03-03 DIAGNOSIS — Z5181 Encounter for therapeutic drug level monitoring: Secondary | ICD-10-CM

## 2017-03-03 DIAGNOSIS — I480 Paroxysmal atrial fibrillation: Secondary | ICD-10-CM

## 2017-03-03 DIAGNOSIS — Z95810 Presence of automatic (implantable) cardiac defibrillator: Secondary | ICD-10-CM | POA: Diagnosis not present

## 2017-03-03 DIAGNOSIS — I10 Essential (primary) hypertension: Secondary | ICD-10-CM

## 2017-03-03 LAB — POCT INR: INR: 2.5

## 2017-03-03 NOTE — Patient Instructions (Signed)

## 2017-04-16 ENCOUNTER — Other Ambulatory Visit: Payer: Self-pay | Admitting: Cardiology

## 2017-04-19 ENCOUNTER — Encounter: Payer: Self-pay | Admitting: *Deleted

## 2017-04-19 ENCOUNTER — Ambulatory Visit (INDEPENDENT_AMBULATORY_CARE_PROVIDER_SITE_OTHER): Payer: Medicare Other | Admitting: *Deleted

## 2017-04-19 DIAGNOSIS — I472 Ventricular tachycardia: Secondary | ICD-10-CM | POA: Diagnosis not present

## 2017-04-19 DIAGNOSIS — I5032 Chronic diastolic (congestive) heart failure: Secondary | ICD-10-CM

## 2017-04-19 DIAGNOSIS — I4729 Other ventricular tachycardia: Secondary | ICD-10-CM

## 2017-04-21 NOTE — Progress Notes (Signed)
Remote ICD transmission.   

## 2017-04-22 LAB — CUP PACEART REMOTE DEVICE CHECK
Brady Statistic AP VP Percent: 86.55 %
Brady Statistic AP VS Percent: 0.01 %
Brady Statistic AS VP Percent: 13.43 %
Brady Statistic AS VS Percent: 0.01 %
Brady Statistic RV Percent Paced: 99.85 %
Date Time Interrogation Session: 20181231092404
HIGH POWER IMPEDANCE MEASURED VALUE: 74 Ohm
HighPow Impedance: 58 Ohm
Implantable Lead Location: 753860
Implantable Lead Model: 4086
Implantable Lead Serial Number: 108215
Implantable Pulse Generator Implant Date: 20150608
Lead Channel Impedance Value: 475 Ohm
Lead Channel Pacing Threshold Amplitude: 1.125 V
Lead Channel Pacing Threshold Pulse Width: 0.4 ms
Lead Channel Sensing Intrinsic Amplitude: 3.625 mV
Lead Channel Sensing Intrinsic Amplitude: 3.625 mV
Lead Channel Sensing Intrinsic Amplitude: 3.625 mV
Lead Channel Setting Pacing Amplitude: 2 V
Lead Channel Setting Pacing Pulse Width: 0.4 ms
MDC IDC LEAD IMPLANT DT: 20030823
MDC IDC LEAD IMPLANT DT: 20030823
MDC IDC LEAD LOCATION: 753859
MDC IDC LEAD SERIAL: 112190
MDC IDC MSMT BATTERY REMAINING LONGEVITY: 42 mo
MDC IDC MSMT BATTERY VOLTAGE: 2.97 V
MDC IDC MSMT LEADCHNL RA PACING THRESHOLD AMPLITUDE: 0.875 V
MDC IDC MSMT LEADCHNL RA PACING THRESHOLD PULSEWIDTH: 0.4 ms
MDC IDC MSMT LEADCHNL RA SENSING INTR AMPL: 3.625 mV
MDC IDC MSMT LEADCHNL RV IMPEDANCE VALUE: 304 Ohm
MDC IDC MSMT LEADCHNL RV IMPEDANCE VALUE: 342 Ohm
MDC IDC SET LEADCHNL RV PACING AMPLITUDE: 2.75 V
MDC IDC SET LEADCHNL RV SENSING SENSITIVITY: 0.3 mV
MDC IDC STAT BRADY RA PERCENT PACED: 85.4 %

## 2017-04-23 ENCOUNTER — Encounter: Payer: Self-pay | Admitting: Cardiology

## 2017-04-26 ENCOUNTER — Other Ambulatory Visit: Payer: Self-pay | Admitting: Cardiology

## 2017-04-26 ENCOUNTER — Ambulatory Visit (INDEPENDENT_AMBULATORY_CARE_PROVIDER_SITE_OTHER): Payer: Medicare Other | Admitting: *Deleted

## 2017-04-26 ENCOUNTER — Other Ambulatory Visit (HOSPITAL_COMMUNITY): Payer: Self-pay | Admitting: Pulmonary Disease

## 2017-04-26 DIAGNOSIS — Z5181 Encounter for therapeutic drug level monitoring: Secondary | ICD-10-CM

## 2017-04-26 DIAGNOSIS — R519 Headache, unspecified: Secondary | ICD-10-CM

## 2017-04-26 DIAGNOSIS — I480 Paroxysmal atrial fibrillation: Secondary | ICD-10-CM | POA: Diagnosis not present

## 2017-04-26 DIAGNOSIS — I4891 Unspecified atrial fibrillation: Secondary | ICD-10-CM

## 2017-04-26 DIAGNOSIS — R51 Headache: Principal | ICD-10-CM

## 2017-04-26 LAB — POCT INR: INR: 2

## 2017-04-26 NOTE — Patient Instructions (Signed)
Continue coumadin 1 1/2 tablets daily except 1 tablet on Mondays, Wednesdays and Fridays  Recheck in 1 weeks On prednisone and doxycycline

## 2017-04-30 ENCOUNTER — Ambulatory Visit (HOSPITAL_COMMUNITY)
Admission: RE | Admit: 2017-04-30 | Discharge: 2017-04-30 | Disposition: A | Discharge status: Home / Self Care | Payer: Medicare Other | Source: Ambulatory Visit | Attending: Pulmonary Disease | Admitting: Pulmonary Disease

## 2017-04-30 DIAGNOSIS — R519 Headache, unspecified: Secondary | ICD-10-CM

## 2017-04-30 DIAGNOSIS — R51 Headache: Secondary | ICD-10-CM | POA: Insufficient documentation

## 2017-05-03 ENCOUNTER — Ambulatory Visit (INDEPENDENT_AMBULATORY_CARE_PROVIDER_SITE_OTHER): Payer: Medicare Other | Admitting: *Deleted

## 2017-05-03 DIAGNOSIS — Z5181 Encounter for therapeutic drug level monitoring: Secondary | ICD-10-CM

## 2017-05-03 DIAGNOSIS — I4891 Unspecified atrial fibrillation: Secondary | ICD-10-CM | POA: Diagnosis not present

## 2017-05-03 DIAGNOSIS — I480 Paroxysmal atrial fibrillation: Secondary | ICD-10-CM

## 2017-05-03 LAB — POCT INR: INR: 2

## 2017-05-03 NOTE — Patient Instructions (Signed)
Increase coumadin to 1 1/2 tablets daily except 1 tablet on Mondays and Thursdays  Recheck in 3 weeks Finishes prednisone tomorrow  Finished doxycycline yesterday

## 2017-05-04 ENCOUNTER — Encounter (HOSPITAL_COMMUNITY): Payer: Self-pay | Admitting: Emergency Medicine

## 2017-05-04 ENCOUNTER — Emergency Department (HOSPITAL_COMMUNITY): Payer: Medicare Other

## 2017-05-04 ENCOUNTER — Other Ambulatory Visit: Payer: Self-pay

## 2017-05-04 ENCOUNTER — Inpatient Hospital Stay (HOSPITAL_COMMUNITY)
Admission: EM | Admit: 2017-05-04 | Discharge: 2017-05-07 | DRG: 190 | Disposition: A | Discharge status: Home Health | Payer: Medicare Other | Attending: Pulmonary Disease | Admitting: Pulmonary Disease

## 2017-05-04 DIAGNOSIS — N183 Chronic kidney disease, stage 3 unspecified: Secondary | ICD-10-CM | POA: Diagnosis present

## 2017-05-04 DIAGNOSIS — J4521 Mild intermittent asthma with (acute) exacerbation: Secondary | ICD-10-CM

## 2017-05-04 DIAGNOSIS — Z885 Allergy status to narcotic agent status: Secondary | ICD-10-CM

## 2017-05-04 DIAGNOSIS — M1990 Unspecified osteoarthritis, unspecified site: Secondary | ICD-10-CM | POA: Diagnosis present

## 2017-05-04 DIAGNOSIS — R748 Abnormal levels of other serum enzymes: Secondary | ICD-10-CM | POA: Diagnosis present

## 2017-05-04 DIAGNOSIS — I4891 Unspecified atrial fibrillation: Secondary | ICD-10-CM | POA: Diagnosis present

## 2017-05-04 DIAGNOSIS — I442 Atrioventricular block, complete: Secondary | ICD-10-CM | POA: Diagnosis present

## 2017-05-04 DIAGNOSIS — I130 Hypertensive heart and chronic kidney disease with heart failure and stage 1 through stage 4 chronic kidney disease, or unspecified chronic kidney disease: Secondary | ICD-10-CM | POA: Diagnosis present

## 2017-05-04 DIAGNOSIS — I5032 Chronic diastolic (congestive) heart failure: Secondary | ICD-10-CM | POA: Diagnosis present

## 2017-05-04 DIAGNOSIS — Z7901 Long term (current) use of anticoagulants: Secondary | ICD-10-CM

## 2017-05-04 DIAGNOSIS — I2510 Atherosclerotic heart disease of native coronary artery without angina pectoris: Secondary | ICD-10-CM | POA: Diagnosis present

## 2017-05-04 DIAGNOSIS — J441 Chronic obstructive pulmonary disease with (acute) exacerbation: Secondary | ICD-10-CM | POA: Diagnosis not present

## 2017-05-04 DIAGNOSIS — Z951 Presence of aortocoronary bypass graft: Secondary | ICD-10-CM

## 2017-05-04 DIAGNOSIS — I482 Chronic atrial fibrillation: Secondary | ICD-10-CM | POA: Diagnosis present

## 2017-05-04 DIAGNOSIS — J9601 Acute respiratory failure with hypoxia: Secondary | ICD-10-CM | POA: Diagnosis present

## 2017-05-04 DIAGNOSIS — I10 Essential (primary) hypertension: Secondary | ICD-10-CM | POA: Diagnosis present

## 2017-05-04 DIAGNOSIS — E782 Mixed hyperlipidemia: Secondary | ICD-10-CM | POA: Diagnosis present

## 2017-05-04 DIAGNOSIS — Z95810 Presence of automatic (implantable) cardiac defibrillator: Secondary | ICD-10-CM

## 2017-05-04 DIAGNOSIS — J45901 Unspecified asthma with (acute) exacerbation: Secondary | ICD-10-CM | POA: Diagnosis present

## 2017-05-04 DIAGNOSIS — N184 Chronic kidney disease, stage 4 (severe): Secondary | ICD-10-CM | POA: Diagnosis present

## 2017-05-04 DIAGNOSIS — D631 Anemia in chronic kidney disease: Secondary | ICD-10-CM | POA: Diagnosis present

## 2017-05-04 DIAGNOSIS — Z888 Allergy status to other drugs, medicaments and biological substances status: Secondary | ICD-10-CM

## 2017-05-04 DIAGNOSIS — Z79899 Other long term (current) drug therapy: Secondary | ICD-10-CM

## 2017-05-04 DIAGNOSIS — F419 Anxiety disorder, unspecified: Secondary | ICD-10-CM | POA: Diagnosis present

## 2017-05-04 DIAGNOSIS — Z955 Presence of coronary angioplasty implant and graft: Secondary | ICD-10-CM

## 2017-05-04 DIAGNOSIS — Z8249 Family history of ischemic heart disease and other diseases of the circulatory system: Secondary | ICD-10-CM

## 2017-05-04 LAB — PROTIME-INR
INR: 1.71
Prothrombin Time: 19.9 seconds — ABNORMAL HIGH (ref 11.4–15.2)

## 2017-05-04 LAB — COMPREHENSIVE METABOLIC PANEL
ALK PHOS: 44 U/L (ref 38–126)
ALT: 24 U/L (ref 14–54)
ANION GAP: 14 (ref 5–15)
AST: 26 U/L (ref 15–41)
Albumin: 4.5 g/dL (ref 3.5–5.0)
BILIRUBIN TOTAL: 1.2 mg/dL (ref 0.3–1.2)
BUN: 31 mg/dL — ABNORMAL HIGH (ref 6–20)
CALCIUM: 9.5 mg/dL (ref 8.9–10.3)
CO2: 27 mmol/L (ref 22–32)
Chloride: 100 mmol/L — ABNORMAL LOW (ref 101–111)
Creatinine, Ser: 1.36 mg/dL — ABNORMAL HIGH (ref 0.44–1.00)
GFR calc non Af Amer: 35 mL/min — ABNORMAL LOW (ref >60)
GFR, EST AFRICAN AMERICAN: 40 mL/min — AB (ref >60)
Glucose, Bld: 118 mg/dL — ABNORMAL HIGH (ref 65–99)
Potassium: 4.2 mmol/L (ref 3.5–5.1)
Sodium: 141 mmol/L (ref 135–145)
TOTAL PROTEIN: 7.9 g/dL (ref 6.5–8.1)

## 2017-05-04 LAB — CBC WITH DIFFERENTIAL/PLATELET
Basophils Absolute: 0 10*3/uL (ref 0.0–0.1)
Basophils Relative: 0 %
EOS ABS: 0.1 10*3/uL (ref 0.0–0.7)
Eosinophils Relative: 1 %
HEMATOCRIT: 47.7 % — AB (ref 36.0–46.0)
HEMOGLOBIN: 15.6 g/dL — AB (ref 12.0–15.0)
LYMPHS ABS: 1.6 10*3/uL (ref 0.7–4.0)
Lymphocytes Relative: 14 %
MCH: 31.1 pg (ref 26.0–34.0)
MCHC: 32.7 g/dL (ref 30.0–36.0)
MCV: 95 fL (ref 78.0–100.0)
MONO ABS: 2.2 10*3/uL — AB (ref 0.1–1.0)
Monocytes Relative: 20 %
NEUTROS ABS: 7.1 10*3/uL (ref 1.7–7.7)
NEUTROS PCT: 65 %
Platelets: 165 10*3/uL (ref 150–400)
RBC: 5.02 MIL/uL (ref 3.87–5.11)
RDW: 13.8 % (ref 11.5–15.5)
WBC: 11.1 10*3/uL — ABNORMAL HIGH (ref 4.0–10.5)

## 2017-05-04 LAB — TROPONIN I
TROPONIN I: 0.03 ng/mL — AB (ref <0.03)
TROPONIN I: 0.06 ng/mL — AB (ref <0.03)
Troponin I: 0.05 ng/mL (ref <0.03)
Troponin I: 0.05 ng/mL (ref <0.03)

## 2017-05-04 LAB — BRAIN NATRIURETIC PEPTIDE: B Natriuretic Peptide: 254 pg/mL — ABNORMAL HIGH (ref 0.0–100.0)

## 2017-05-04 MED ORDER — AMLODIPINE BESYLATE 5 MG PO TABS
5.0000 mg | ORAL_TABLET | Freq: Every day | ORAL | Status: DC
  Administered 2017-05-04 – 2017-05-07 (×4): 5 mg via ORAL
  Filled 2017-05-04 (×4): qty 1

## 2017-05-04 MED ORDER — ALBUTEROL SULFATE (2.5 MG/3ML) 0.083% IN NEBU: 2.5000 mg | INHALATION_SOLUTION | Freq: Three times a day (TID) | RESPIRATORY_TRACT | Status: DC

## 2017-05-04 MED ORDER — IPRATROPIUM BROMIDE 0.02 % IN SOLN: 0.5000 mg | Freq: Three times a day (TID) | RESPIRATORY_TRACT | Status: DC

## 2017-05-04 MED ORDER — IPRATROPIUM-ALBUTEROL 0.5-2.5 (3) MG/3ML IN SOLN
3.0000 mL | Freq: Three times a day (TID) | RESPIRATORY_TRACT | Status: DC
  Administered 2017-05-04 – 2017-05-05 (×3): 3 mL via RESPIRATORY_TRACT
  Filled 2017-05-04 (×3): qty 3

## 2017-05-04 MED ORDER — METOPROLOL SUCCINATE ER 50 MG PO TB24
100.0000 mg | ORAL_TABLET | Freq: Every day | ORAL | Status: DC
  Administered 2017-05-04 – 2017-05-07 (×4): 100 mg via ORAL
  Filled 2017-05-04 (×4): qty 2

## 2017-05-04 MED ORDER — SODIUM CHLORIDE 0.9% FLUSH
3.0000 mL | Freq: Two times a day (BID) | INTRAVENOUS | Status: DC
  Administered 2017-05-04 – 2017-05-06 (×6): 3 mL via INTRAVENOUS

## 2017-05-04 MED ORDER — IPRATROPIUM BROMIDE 0.02 % IN SOLN
0.5000 mg | Freq: Once | RESPIRATORY_TRACT | Status: AC
  Administered 2017-05-04: 0.5 mg via RESPIRATORY_TRACT
  Filled 2017-05-04: qty 2.5

## 2017-05-04 MED ORDER — SODIUM CHLORIDE 0.9% FLUSH: 3.0000 mL | INTRAVENOUS | Status: DC | PRN

## 2017-05-04 MED ORDER — FUROSEMIDE 40 MG PO TABS
40.0000 mg | ORAL_TABLET | Freq: Every day | ORAL | Status: DC
Start: 2017-05-04 — End: 2017-05-07
  Administered 2017-05-04 – 2017-05-07 (×4): 40 mg via ORAL
  Filled 2017-05-04 (×4): qty 1

## 2017-05-04 MED ORDER — ONDANSETRON HCL 4 MG PO TABS: 4.0000 mg | ORAL_TABLET | Freq: Four times a day (QID) | ORAL | Status: DC | PRN

## 2017-05-04 MED ORDER — POTASSIUM CHLORIDE CRYS ER 20 MEQ PO TBCR
20.0000 meq | EXTENDED_RELEASE_TABLET | Freq: Three times a day (TID) | ORAL | Status: DC
  Administered 2017-05-04 – 2017-05-07 (×10): 20 meq via ORAL
  Filled 2017-05-04 (×11): qty 1

## 2017-05-04 MED ORDER — ONDANSETRON HCL 4 MG/2ML IJ SOLN: 4.0000 mg | Freq: Four times a day (QID) | INTRAMUSCULAR | Status: DC | PRN

## 2017-05-04 MED ORDER — DOFETILIDE 125 MCG PO CAPS
125.0000 ug | ORAL_CAPSULE | Freq: Two times a day (BID) | ORAL | Status: DC
  Administered 2017-05-04 – 2017-05-07 (×7): 125 ug via ORAL
  Filled 2017-05-04 (×14): qty 1

## 2017-05-04 MED ORDER — ACETAMINOPHEN 500 MG PO TABS
500.0000 mg | ORAL_TABLET | Freq: Every day | ORAL | Status: DC
  Administered 2017-05-04 – 2017-05-07 (×4): 500 mg via ORAL
  Filled 2017-05-04 (×4): qty 1

## 2017-05-04 MED ORDER — GUAIFENESIN ER 600 MG PO TB12
600.0000 mg | ORAL_TABLET | Freq: Two times a day (BID) | ORAL | Status: DC
  Administered 2017-05-04 – 2017-05-07 (×7): 600 mg via ORAL
  Filled 2017-05-04 (×7): qty 1

## 2017-05-04 MED ORDER — WARFARIN - PHARMACIST DOSING INPATIENT
Status: DC
  Administered 2017-05-04 – 2017-05-06 (×3)

## 2017-05-04 MED ORDER — ALBUTEROL SULFATE (2.5 MG/3ML) 0.083% IN NEBU
5.0000 mg | INHALATION_SOLUTION | Freq: Once | RESPIRATORY_TRACT | Status: AC
  Administered 2017-05-04: 5 mg via RESPIRATORY_TRACT
  Filled 2017-05-04: qty 6

## 2017-05-04 MED ORDER — ALBUTEROL SULFATE (2.5 MG/3ML) 0.083% IN NEBU: 2.5000 mg | INHALATION_SOLUTION | Freq: Four times a day (QID) | RESPIRATORY_TRACT | Status: DC

## 2017-05-04 MED ORDER — SODIUM CHLORIDE 0.9 % IV SOLN: 250.0000 mL | INTRAVENOUS | Status: DC | PRN

## 2017-05-04 MED ORDER — ALBUTEROL (5 MG/ML) CONTINUOUS INHALATION SOLN
10.0000 mg/h | INHALATION_SOLUTION | Freq: Once | RESPIRATORY_TRACT | Status: AC
  Administered 2017-05-04: 10 mg/h via RESPIRATORY_TRACT
  Filled 2017-05-04: qty 20

## 2017-05-04 MED ORDER — IPRATROPIUM BROMIDE 0.02 % IN SOLN: 0.5000 mg | Freq: Four times a day (QID) | RESPIRATORY_TRACT | Status: DC

## 2017-05-04 MED ORDER — ATORVASTATIN CALCIUM 20 MG PO TABS
20.0000 mg | ORAL_TABLET | Freq: Every day | ORAL | Status: DC
  Administered 2017-05-04 – 2017-05-07 (×4): 20 mg via ORAL
  Filled 2017-05-04 (×4): qty 1

## 2017-05-04 MED ORDER — METHYLPREDNISOLONE SODIUM SUCC 125 MG IJ SOLR
125.0000 mg | Freq: Once | INTRAMUSCULAR | Status: AC
  Administered 2017-05-04: 125 mg via INTRAVENOUS
  Filled 2017-05-04: qty 2

## 2017-05-04 MED ORDER — LOSARTAN POTASSIUM 50 MG PO TABS
100.0000 mg | ORAL_TABLET | Freq: Every day | ORAL | Status: DC
  Administered 2017-05-04 – 2017-05-07 (×4): 100 mg via ORAL
  Filled 2017-05-04 (×4): qty 2

## 2017-05-04 MED ORDER — METHYLPREDNISOLONE SODIUM SUCC 125 MG IJ SOLR
60.0000 mg | Freq: Four times a day (QID) | INTRAMUSCULAR | Status: DC
  Administered 2017-05-04 – 2017-05-07 (×12): 60 mg via INTRAVENOUS
  Filled 2017-05-04 (×13): qty 2

## 2017-05-04 MED ORDER — WARFARIN SODIUM 2 MG PO TABS
4.0000 mg | ORAL_TABLET | Freq: Once | ORAL | Status: AC
  Administered 2017-05-04: 4 mg via ORAL
  Filled 2017-05-04: qty 2

## 2017-05-04 NOTE — H&P (Signed)
TRH H&P    Patient Demographics:    [name] [name], is a 84 y.o. female  MRN: [id number]  DOB - [birth year]  Admit Date - 05/04/2017  Referring MD/NP/PA: Dr. Tomi Bamberger  Outpatient Primary MD for the patient is Sinda Du, MD  Patient coming from: home  Chief Complaint  Patient presents with  . Shortness of Breath      HPI:    [name] [name]  is a 84 y.o. female, with history of atrial fibrillation on chronic anticoagulation with warfarin, chronic diastolic CHF, CAD, complete heart block status post pacemaker/defibrillator, came to hospital with worsening shortness of breath.So she was seen by PCP as outpatient, and prescribed doxycycline along with prednisone. Patient says that she was initially feeling better but again started having shortness of breath. She denies chest pain. Complains of coughing but unable to cough up any phlegm. Patient said that she had PFTs done as outpatient which showed she may have COPD. She never smoked cigarettes denies nausea vomiting or diarrhea.. Denies dysuria urgency or frequency of urination.  In the ED, patient was given Solu-Medrol, albuterol and  Atrovent nebulizer.    Review of systems:     All other systems reviewed and are negative.   With Past History of the following :    Past Medical History:  Diagnosis Date  . Atrial fibrillation ([place])    a. anticoagulated with Warfarin  . CHF (congestive heart failure) ([place])   . Chronic diastolic heart failure ([place])   . Complete heart block ([place])   . Coronary atherosclerosis of native coronary artery    Multivessel status post CABG, DES PLA March 2006  . Essential hypertension, benign   . Mixed hyperlipidemia   . Osteoarthritis   . Ventricular fibrillation ([place]) 2003   a. seen on PPM interrogation a/w syncope      Past Surgical History:  Procedure Laterality Date  . CARDIAC DEFIBRILLATOR PLACEMENT     MDT dual  chamber ICD  . CARDIOVERSION N/A 08/09/2014   Procedure: CARDIOVERSION;  Surgeon: Sanda Klein, MD;  Location: [facility];  Service: Cardiovascular;  Laterality: N/A;  . CORONARY ARTERY BYPASS GRAFT     LIMA to LAD, SVG to diagonal, SVG to ramus and OM  . IMPLANTABLE CARDIOVERTER DEFIBRILLATOR GENERATOR CHANGE N/A 09/25/2013   Procedure: IMPLANTABLE CARDIOVERTER DEFIBRILLATOR GENERATOR CHANGE;  Surgeon: Evans Lance, MD;  Location: [facility] CATH LAB;  Service: Cardiovascular;  Laterality: N/A;  . LEAD REVISION N/A 09/29/2013   Procedure: LEAD REVISION;  Surgeon: Deboraha Sprang, MD;  Location: [facility] CATH LAB;  Service: Cardiovascular;  Laterality: N/A;  . TONSILLECTOMY    . YAG LASER APPLICATION Left 07/25/9627   Procedure: YAG LASER APPLICATION;  Surgeon: Elta Guadeloupe T. Gershon Crane, MD;  Location: [facility] ORS;  Service: Ophthalmology;  Laterality: Left;  . YAG LASER APPLICATION Right 09/15/4130   Procedure: YAG LASER APPLICATION;  Surgeon: Elta Guadeloupe T. Gershon Crane, MD;  Location: [facility] OR[facility];  Service: Ophthalmology;  Laterality: Right;      Social History:      Social History  Tobacco Use  . Smoking status: Never Smoker  . Smokeless tobacco: Never Used  Substance Use Topics  . Alcohol use: No    Alcohol/week: 0.0 oz       Family History :     Family History  Problem Relation Age of Onset  . Heart attack Father   . Heart attack Brother       Home Medications:   Prior to Admission medications   Medication Sig Start Date End Date Taking? Authorizing Provider  acetaminophen (TYLENOL) 500 MG tablet Take 500 mg by mouth daily.     [provider]  albuterol (PROVENTIL HFA;VENTOLIN HFA) 108 (90 BASE) MCG/ACT inhaler Inhale 2 puffs into the lungs every 6 (six) hours as needed for wheezing or shortness of breath. 07/05/14   Lendon Colonel, NP  amLODipine (NORVASC) 5 MG tablet TAKE (1) TABLET BY MOUTH ONCE DAILY. 04/16/17   Satira Sark, MD  atorvastatin (LIPITOR) 20 MG tablet TAKE ONE TABLET BY  MOUTH ONCE DAILY. 04/26/17   Satira Sark, MD  Calcium Carbonate-Vitamin D (CALCIUM 600 + D PO) Take 1 tablet by mouth 2 (two) times daily.    [provider]  Cholecalciferol (VITAMIN D) 2000 UNITS CAPS Take 1 capsule by mouth at bedtime.     [provider]  dofetilide (TIKOSYN) 125 MCG capsule TAKE (1) CAPSULE BY MOUTH TWICE DAILY. 10/30/16   Evans Lance, MD  folic acid (FOLVITE) 1 MG tablet Take 1 mg by mouth 2 (two) times daily.     [provider]  furosemide (LASIX) 40 MG tablet Take one tablet by mouth every morning    [provider]  losartan (COZAAR) 100 MG tablet TAKE ONE TABLET BY MOUTH DAILY. 04/16/17   Satira Sark, MD  metoprolol succinate (TOPROL-XL) 100 MG 24 hr tablet TAKE ONE TABLET BY MOUTH EVERY MORNING. TAKE WITH OR IMMEDIATELY FOLLOWING A MEAL. 04/16/17   Satira Sark, MD  Polyethyl Glycol-Propyl Glycol (SYSTANE OP) Place 1 drop into both eyes daily as needed (Dry eyes).    [provider]  potassium chloride SA (K-DUR,KLOR-CON) 20 MEQ tablet Take 20 mEq by mouth 3 (three) times daily.    [provider]  warfarin (COUMADIN) 2.5 MG tablet Take 1 1/2 tablets daily except 1 tablet on Mondays, Wednesdays and Fridays 04/26/17   Satira Sark, MD     Allergies:     Allergies  Allergen Reactions  . Codeine Nausea And Vomiting  . Keflex [Cephalexin] Itching and Rash     Physical Exam:   Vitals  Blood pressure (!) 152/41, pulse (!) 35, temperature 99.5 F (37.5 C), temperature source Oral, resp. rate (!) 29, height 5' (1.524 m), weight 75.3 kg (166 lb), SpO2 95 %.  1.  General: appears in mild respiratory distress  2. Psychiatric:  Intact judgement and  insight, awake alert, oriented x 3.  3. Neurologic: No focal neurological deficits, all cranial nerves intact.Strength 5/5 all 4 extremities, sensation intact all 4 extremities, plantars down going.  4. Eyes :  anicteric sclerae, moist  conjunctivae with no lid lag. PERRLA.  5. ENMT:  Oropharynx clear with moist mucous membranes and good dentition  6. Neck:  supple, no cervical lymphadenopathy appriciated, No thyromegaly  7. Respiratory : Normal respiratory effort, bilateral wheezing  8. Cardiovascular : RRR, no gallops, rubs or murmurs, no leg edema  9. Gastrointestinal:  Positive bowel sounds, abdomen soft, non-tender to palpation,no hepatosplenomegaly, no rigidity or guarding  10. Skin:  No cyanosis, normal texture and turgor, no rash, lesions or ulcers  11.Musculoskeletal:  Good muscle tone,  joints appear normal , no effusions,  normal range of motion    Data Review:    CBC Recent Labs  Lab 05/04/17 0021  WBC 11.1*  HGB 15.6*  HCT 47.7*  PLT 165  MCV 95.0  MCH 31.1  MCHC 32.7  RDW 13.8  LYMPHSABS 1.6  MONOABS 2.2*  EOSABS 0.1  BASOSABS 0.0   ------------------------------------------------------------------------------------------------------------------  Chemistries  Recent Labs  Lab 05/04/17 0021  NA 141  K 4.2  CL 100*  CO2 27  GLUCOSE 118*  BUN 31*  CREATININE 1.36*  CALCIUM 9.5  AST 26  ALT 24  ALKPHOS 44  BILITOT 1.2   ------------------------------------------------------------------------------------------------------------------  ------------------------------------------------------------------------------------------------------------------ GFR: Estimated Creatinine Clearance: 28.4 mL/min (A) (by C-G formula based on SCr of 1.36 mg/dL (H)). Liver Function Tests: Recent Labs  Lab 05/04/17 0021  AST 26  ALT 24  ALKPHOS 44  BILITOT 1.2  PROT 7.9  ALBUMIN 4.5   No results for input(s): LIPASE, AMYLASE in the last 168 hours. No results for input(s): AMMONIA in the last 168 hours. Coagulation Profile: Recent Labs  Lab 05/03/17 1555  INR 2.0   Cardiac Enzymes: Recent Labs  Lab 05/04/17 0027  TROPONINI 0.03*   BNP (last 3 results) No results  for input(s): PROBNP in the last 8760 hours. HbA1C: No results for input(s): HGBA1C in the last 72 hours. CBG: No results for input(s): GLUCAP in the last 168 hours. Lipid Profile: No results for input(s): CHOL, HDL, LDLCALC, TRIG, CHOLHDL, LDLDIRECT in the last 72 hours. Thyroid Function Tests: No results for input(s): TSH, T4TOTAL, FREET4, T3FREE, THYROIDAB in the last 72 hours. Anemia Panel: No results for input(s): VITAMINB12, FOLATE, FERRITIN, TIBC, IRON, RETICCTPCT in the last 72 hours.  --------------------------------------------------------------------------------------------------------------- Urine analysis:    Component Value Date/Time   COLORURINE YELLOW 04/11/2013 2216   APPEARANCEUR CLEAR 04/11/2013 2216   LABSPEC 1.020 04/11/2013 2216   PHURINE 5.5 04/11/2013 2216   GLUCOSEU NEGATIVE 04/11/2013 2216   HGBUR SMALL (A) 04/11/2013 2216   BILIRUBINUR NEGATIVE 04/11/2013 2216   KETONESUR NEGATIVE 04/11/2013 2216   PROTEINUR NEGATIVE 04/11/2013 2216   UROBILINOGEN 0.2 04/11/2013 2216   NITRITE NEGATIVE 04/11/2013 2216   LEUKOCYTESUR NEGATIVE 04/11/2013 2216      Imaging Results:    Dg Chest 2 View  Result Date: 05/04/2017 CLINICAL DATA:  Dyspnea EXAM: CHEST  2 VIEW COMPARISON:  11/06/2015 chest radiograph. FINDINGS: Stable configuration of 3 lead left subclavian pacemaker. Intact sternotomy wires. CABG clips overlie the mediastinum. Stable cardiomediastinal silhouette with normal heart size. No pneumothorax. No pleural effusion. Lungs appear clear, with no acute consolidative airspace disease and no pulmonary edema. IMPRESSION: No active cardiopulmonary disease. Electronically Signed   By: Ilona Sorrel M.D.   On: 05/04/2017 02:11       Assessment & Plan:    Active Problems:   Essential hypertension, benign   Atrial fibrillation (HCC)   Chronic diastolic heart failure (HCC)   COPD exacerbation ([place])   1. COPD exacerbation-will start Solu-Medrol 60 mg IV Q6  hours, albuterol and Atrovent nebulizers Q6 hours, Mucinex one tablet PO BID. 2. Chronic diastolic CHF-continue Lasix 40 mg PO daily, euvolemic. 3. Atrial fibrillation-continue to question, Coumadin per pharmacy. Will obtain EKG. 4. Hypertension-continue home medications including metoprolol, Cozaar, amlodipine   DVT Prophylaxis-   Coumadin   AM Labs Ordered, also please review Full Orders  Family Communication: Admission, patients condition and plan of care including tests being ordered have been discussed with the patient and her husband at bedside who indicate understanding and agree with the plan and Code Status.  Code Status: full code  Admission status: observation  Time spent in minutes : 60 minutes   Oswald Hillock M.D on 05/04/2017 at 5:58 AM  Between 7am to 7pm - Pager - [phone]. After 7pm go to [email or web address] - password [facility]  [facility] - Office  [phone]

## 2017-05-04 NOTE — Progress Notes (Signed)
ANTICOAGULATION CONSULT NOTE - Initial Consult  Pharmacy Consult for Coumadin (home med) Indication: atrial fibrillation  Allergies  Allergen Reactions  . Codeine Nausea And Vomiting  . Keflex [Cephalexin] Itching and Rash    Patient Measurements: Height: 5' (152.4 cm) Weight: 166 lb (75.3 kg) IBW/kg (Calculated) : 45.5  Vital Signs: Temp: 99.5 F (37.5 C) (01/15 0019) Temp Source: Oral (01/15 0019) BP: 127/48 (01/15 0830) Pulse Rate: 70 (01/15 0900)  Labs: Recent Labs    05/03/17 1555 05/04/17 0021 05/04/17 0022 05/04/17 0027  HGB  --  15.6*  --   --   HCT  --  47.7*  --   --   PLT  --  165  --   --   LABPROT  --   --  19.9*  --   INR 2.0  --  1.71  --   CREATININE  --  1.36*  --   --   TROPONINI  --   --   --  0.03*    Estimated Creatinine Clearance: 28.4 mL/min (A) (by C-G formula based on SCr of 1.36 mg/dL (H)).   Medical History: Past Medical History:  Diagnosis Date  . Atrial fibrillation ([place])    a. anticoagulated with Warfarin  . CHF (congestive heart failure) ([place])   . Chronic diastolic heart failure ([place])   . Complete heart block ([place])   . Coronary atherosclerosis of native coronary artery    Multivessel status post CABG, DES PLA March 2006  . Essential hypertension, benign   . Mixed hyperlipidemia   . Osteoarthritis   . Ventricular fibrillation ([place]) 2003   a. seen on PPM interrogation a/w syncope    Medications:  Medications Prior to Admission  Medication Sig Dispense Refill Last Dose  . acetaminophen (TYLENOL) 500 MG tablet Take 500 mg by mouth daily.    05/03/2017 at Unknown time  . albuterol (PROVENTIL HFA;VENTOLIN HFA) 108 (90 BASE) MCG/ACT inhaler Inhale 2 puffs into the lungs every 6 (six) hours as needed for wheezing or shortness of breath. 1 Inhaler 2 unknown  . amLODipine (NORVASC) 5 MG tablet TAKE (1) TABLET BY MOUTH ONCE DAILY. 90 tablet 3 05/03/2017 at Unknown time  . atorvastatin (LIPITOR) 20 MG tablet TAKE ONE TABLET BY MOUTH  ONCE DAILY. 90 tablet 3 05/03/2017 at Unknown time  . Calcium Carbonate-Vitamin D (CALCIUM 600 + D PO) Take 1 tablet by mouth 2 (two) times daily.   05/03/2017 at Unknown time  . Cholecalciferol (VITAMIN D) 2000 UNITS CAPS Take 1 capsule by mouth at bedtime.    05/03/2017 at Unknown time  . dofetilide (TIKOSYN) 125 MCG capsule TAKE (1) CAPSULE BY MOUTH TWICE DAILY. (Patient taking differently: TAKE (1) CAPSULE BY MOUTH EVERY 12 HOURS.) 180 capsule 1 05/03/2017 at 2235  . folic acid (FOLVITE) 1 MG tablet Take 1 mg by mouth 2 (two) times daily.    05/03/2017 at Unknown time  . furosemide (LASIX) 40 MG tablet Take one tablet by mouth every morning   05/03/2017 at Unknown time  . losartan (COZAAR) 100 MG tablet TAKE ONE TABLET BY MOUTH DAILY. 90 tablet 3 05/03/2017 at Unknown time  . metoprolol succinate (TOPROL-XL) 100 MG 24 hr tablet TAKE ONE TABLET BY MOUTH EVERY MORNING. TAKE WITH OR IMMEDIATELY FOLLOWING A MEAL. 90 tablet 3 05/03/2017 at 0900  . Polyethyl Glycol-Propyl Glycol (SYSTANE OP) Place 1 drop into both eyes daily as needed (Dry eyes).   Past Month at Unknown time  . potassium chloride SA (  K-DUR,KLOR-CON) 20 MEQ tablet Take 20 mEq by mouth 3 (three) times daily.   05/03/2017 at Unknown time  . predniSONE (DELTASONE) 10 MG tablet Take 10-40 mg by mouth as directed. Take 4 tabs daily for 3 days, 3 tabs daily for 3 days, 2 tabs daily for 3 days,, 1 tab daily for 3 days. Patient on the last day.   05/03/2017 at Unknown time  . warfarin (COUMADIN) 2.5 MG tablet Take 1 1/2 tablets daily except 1 tablet on Mondays, Wednesdays and Fridays (Patient taking differently: Take 2.5-3.75 mg by mouth as directed. Take 1 1/2 tablets daily except 1 tablet on Mondays and Thursdays.) 45 tablet 6 05/03/2017 at Unknown time  . doxycycline (VIBRA-TABS) 100 MG tablet Take 100 mg by mouth 2 (two) times daily. 10 day course. Finished on Sunday 05/02/17.       Assessment: 84yo female on chronic coumadin for h/o afib.  INR has  trended down to SUBtherapeutic level today.  Home dose listed above.   Goal of Therapy:  INR 2-3 Monitor platelets by anticoagulation protocol: Yes   Plan:  Coumadin 4mg  today x 1 (to boost INR) INR daily  [place] Crane, Jazell Rosenau A 05/04/2017,10:26 AM

## 2017-05-04 NOTE — ED Triage Notes (Signed)
Pt with hx of CHF here for c/o SOB. States she has had a cough since Saturday.

## 2017-05-04 NOTE — ED Notes (Signed)
Critical result. Troponin 0.03. EDP [place] notified.

## 2017-05-04 NOTE — Progress Notes (Signed)
CRITICAL VALUE ALERT  Critical Value:  Troponin 0.06  Date & Time Notied:  05/04/2017 1135  Provider Notified: Dr. Luan Pulling  Orders Received/Actions taken: paged awaiting call back

## 2017-05-04 NOTE — Care Management Obs Status (Signed)
[place] NOTIFICATION   Patient Details  Name: [name] [name] MRN: [id number] Date of Birth: 1931-10-26   Medicare Observation Status Notification Given:  Yes    Sherald Barge, RN 05/04/2017, 2:09 PM

## 2017-05-04 NOTE — ED Provider Notes (Addendum)
[facility] EMERGENCY DEPARTMENT Provider Note   CSN: [id number] Arrival date & time: 05/04/17  0015  Time seen 01:55 AM   History   Chief Complaint Chief Complaint  Patient presents with  . Shortness of Breath    HPI [name] [name] is a 84 y.o. female.  HPI patient states she started feeling short of breath on January 12 and also had a cough.  She states that she has been on doxycycline for 10 days and finished it yesterday and prednisone which she will finish tomorrow.  She states that is for a possible sinus infection.  She states she had a CT of her head done by her PCP and she is going to get that test results tomorrow.  She states she has had shortness of breath like this before with congestive heart failure but that was several years ago.  She denies PND and states she has chronic dyspnea on exertion but it is worse than her baseline.  She denies any abdominal distention or swelling or change in swelling of her lower extremities.  She states she usually has some swelling of her right lower extremity where she had some prior surgery.  She also states she has been wheezing but is never been diagnosed with reactive airway disease.  She states her PCP thought she may have had COPD on a pulmonary function test but she states she never smoked and her husband never smoked significantly other than cigars now and then.  She states she has atrial fib and she has a defibrillator pacemaker inserted.  She states she is used inhalers and nebulizers in the past but has not used them recently.  PCP Sinda Du, MD  Past Medical History:  Diagnosis Date  . Atrial fibrillation ([place])    a. anticoagulated with Warfarin  . CHF (congestive heart failure) ([place])   . Chronic diastolic heart failure ([place])   . Complete heart block ([place])   . Coronary atherosclerosis of native coronary artery    Multivessel status post CABG, DES PLA March 2006  . Essential hypertension, benign   . Mixed hyperlipidemia     . Osteoarthritis   . Ventricular fibrillation ([place]) 2003   a. seen on PPM interrogation a/w syncope    Patient Active Problem List   Diagnosis Date Noted  . COPD exacerbation ([place]) 05/04/2017  . Pacemaker complications 73/71/0626  . CAP (community acquired pneumonia) 07/19/2013  . Shortness of breath 07/19/2013  . Encounter for therapeutic drug monitoring 06/12/2013  . Chronic diastolic heart failure ([place]) 04/11/2013  . Aortic stenosis 10/18/2012  . Encounter for long-term (current) use of anticoagulants 10/08/2011  . Atrial fibrillation ([place]) 09/21/2011  . Mixed hyperlipidemia 08/25/2008  . Essential hypertension, benign 08/25/2008  . Coronary atherosclerosis of native coronary artery 08/25/2008  . VENTRICULAR TACHYCARDIA 08/25/2008  . Automatic implantable cardioverter-defibrillator in situ 08/25/2008    Past Surgical History:  Procedure Laterality Date  . CARDIAC DEFIBRILLATOR PLACEMENT     MDT dual chamber ICD  . CARDIOVERSION N/A 08/09/2014   Procedure: CARDIOVERSION;  Surgeon: Sanda Klein, MD;  Location: [facility] ENDOSCOPY;  Service: Cardiovascular;  Laterality: N/A;  . CORONARY ARTERY BYPASS GRAFT     LIMA to LAD, SVG to diagonal, SVG to ramus and OM  . IMPLANTABLE CARDIOVERTER DEFIBRILLATOR GENERATOR CHANGE N/A 09/25/2013   Procedure: IMPLANTABLE CARDIOVERTER DEFIBRILLATOR GENERATOR CHANGE;  Surgeon: Evans Lance, MD;  Location: [facility] CATH LAB;  Service: Cardiovascular;  Laterality: N/A;  . LEAD REVISION N/A 09/29/2013  Procedure: LEAD REVISION;  Surgeon: Deboraha Sprang, MD;  Location: [facility] CATH LAB;  Service: Cardiovascular;  Laterality: N/A;  . TONSILLECTOMY    . YAG LASER APPLICATION Left 06/18/5398   Procedure: YAG LASER APPLICATION;  Surgeon: Elta Guadeloupe T. Gershon Crane, MD;  Location: [facility] ORS;  Service: Ophthalmology;  Laterality: Left;  . YAG LASER APPLICATION Right 8/67/6195   Procedure: YAG LASER APPLICATION;  Surgeon: Elta Guadeloupe T. Gershon Crane, MD;  Location: [facility] ORS;  Service: Ophthalmology;   Laterality: Right;    OB History    Gravida Para Term Preterm AB Living   2 2 2     2    SAB TAB Ectopic Multiple Live Births                   Home Medications    Prior to Admission medications   Medication Sig Start Date End Date Taking? Authorizing Provider  acetaminophen (TYLENOL) 500 MG tablet Take 500 mg by mouth daily.     [provider]  albuterol (PROVENTIL HFA;VENTOLIN HFA) 108 (90 BASE) MCG/ACT inhaler Inhale 2 puffs into the lungs every 6 (six) hours as needed for wheezing or shortness of breath. 07/05/14   Lendon Colonel, NP  amLODipine (NORVASC) 5 MG tablet TAKE (1) TABLET BY MOUTH ONCE DAILY. 04/16/17   Satira Sark, MD  atorvastatin (LIPITOR) 20 MG tablet TAKE ONE TABLET BY MOUTH ONCE DAILY. 04/26/17   Satira Sark, MD  Calcium Carbonate-Vitamin D (CALCIUM 600 + D PO) Take 1 tablet by mouth 2 (two) times daily.    [provider]  Cholecalciferol (VITAMIN D) 2000 UNITS CAPS Take 1 capsule by mouth at bedtime.     [provider]  dofetilide (TIKOSYN) 125 MCG capsule TAKE (1) CAPSULE BY MOUTH TWICE DAILY. 10/30/16   Evans Lance, MD  folic acid (FOLVITE) 1 MG tablet Take 1 mg by mouth 2 (two) times daily.     [provider]  furosemide (LASIX) 40 MG tablet Take one tablet by mouth every morning    [provider]  losartan (COZAAR) 100 MG tablet TAKE ONE TABLET BY MOUTH DAILY. 04/16/17   Satira Sark, MD  metoprolol succinate (TOPROL-XL) 100 MG 24 hr tablet TAKE ONE TABLET BY MOUTH EVERY MORNING. TAKE WITH OR IMMEDIATELY FOLLOWING A MEAL. 04/16/17   Satira Sark, MD  Polyethyl Glycol-Propyl Glycol (SYSTANE OP) Place 1 drop into both eyes daily as needed (Dry eyes).    [provider]  potassium chloride SA (K-DUR,KLOR-CON) 20 MEQ tablet Take 20 mEq by mouth 3 (three) times daily.    [provider]  warfarin (COUMADIN) 2.5 MG tablet Take 1 1/2 tablets daily except 1 tablet on  Mondays, Wednesdays and Fridays 04/26/17   Satira Sark, MD    Family History Family History  Problem Relation Age of Onset  . Heart attack Father   . Heart attack Brother     Social History Social History   Tobacco Use  . Smoking status: Never Smoker  . Smokeless tobacco: Never Used  Substance Use Topics  . Alcohol use: No    Alcohol/week: 0.0 oz  . Drug use: No  Retired Therapist, sports   Allergies   Codeine and Keflex [cephalexin]   Review of Systems Review of Systems  All other systems reviewed and are negative.    Physical Exam Updated Vital Signs BP (!) 150/68   Pulse 80   Temp 99.5 F (37.5 C) (Oral)   Resp [name])  23   Ht 5' (1.524 m)   Wt 75.3 kg (166 lb)   SpO2 95%   BMI 32.42 kg/m   Vital signs normal except hypertension and low-grade temp   Physical Exam  Constitutional: She is oriented to person, place, and time. She appears well-developed and well-nourished.  Non-toxic appearance. She does not appear ill. No distress.  HENT:  Head: Normocephalic and atraumatic.  Right Ear: External ear normal.  Left Ear: External ear normal.  Nose: Nose normal. No mucosal edema or rhinorrhea.  Mouth/Throat: Oropharynx is clear and moist and mucous membranes are normal. No dental abscesses or uvula swelling.  Eyes: Conjunctivae and EOM are normal. Pupils are equal, round, and reactive to light.  Neck: Normal range of motion and full passive range of motion without pain. Neck supple.  Cardiovascular: Normal rate, regular rhythm and normal heart sounds. Exam reveals no gallop and no friction rub.  No murmur heard. Pulmonary/Chest: Effort normal. No respiratory distress. She has decreased breath sounds. She has wheezes. She has no rhonchi. She has no rales. She exhibits no tenderness and no crepitus.  Wheezing is audible, at the time of my exam she had already had one breathing treatment  Abdominal: Soft. Normal appearance and bowel sounds are normal. She exhibits no  distension. There is no tenderness. There is no rebound and no guarding.  Musculoskeletal: Normal range of motion. She exhibits no edema or tenderness.  Moves all extremities well.   Neurological: She is alert and oriented to person, place, and time. She has normal strength. No cranial nerve deficit.  Skin: Skin is warm, dry and intact. No rash noted. No erythema. No pallor.  Psychiatric: She has a normal mood and affect. Her speech is normal and behavior is normal. Her mood appears not anxious.  Nursing note and vitals reviewed.    ED Treatments / Results  Labs (all labs ordered are listed, but only abnormal results are displayed) Results for orders placed or performed during the hospital encounter of 05/04/17  CBC with Differential  Result Value Ref Range   WBC 11.1 (H) 4.0 - 10.5 K/uL   RBC 5.02 3.87 - 5.11 MIL/uL   Hemoglobin 15.6 (H) 12.0 - 15.0 g/dL   HCT 47.7 (H) 36.0 - 46.0 %   MCV 95.0 78.0 - 100.0 fL   MCH 31.1 26.0 - 34.0 pg   MCHC 32.7 30.0 - 36.0 g/dL   RDW 13.8 11.5 - 15.5 %   Platelets 165 150 - 400 K/uL   Neutrophils Relative % 65 %   Neutro Abs 7.1 1.7 - 7.7 K/uL   Lymphocytes Relative 14 %   Lymphs Abs 1.6 0.7 - 4.0 K/uL   Monocytes Relative 20 %   Monocytes Absolute 2.2 (H) 0.1 - 1.0 K/uL   Eosinophils Relative 1 %   Eosinophils Absolute 0.1 0.0 - 0.7 K/uL   Basophils Relative 0 %   Basophils Absolute 0.0 0.0 - 0.1 K/uL  Comprehensive metabolic panel  Result Value Ref Range   Sodium 141 135 - 145 mmol/L   Potassium 4.2 3.5 - 5.1 mmol/L   Chloride 100 (L) 101 - 111 mmol/L   CO2 27 22 - 32 mmol/L   Glucose, Bld 118 (H) 65 - 99 mg/dL   BUN 31 (H) 6 - 20 mg/dL   Creatinine, Ser 1.36 (H) 0.44 - 1.00 mg/dL   Calcium 9.5 8.9 - 10.3 mg/dL   Total Protein 7.9 6.5 - 8.1 g/dL   Albumin 4.5 3.5 -  5.0 g/dL   AST 26 15 - 41 U/L   ALT 24 14 - 54 U/L   Alkaline Phosphatase 44 38 - 126 U/L   Total Bilirubin 1.2 0.3 - 1.2 mg/dL   GFR calc non Af Amer 35 (L) >60  mL/min   GFR calc Af Amer 40 (L) >60 mL/min   Anion gap 14 5 - 15  Brain natriuretic peptide  Result Value Ref Range   B Natriuretic Peptide 254.0 (H) 0.0 - 100.0 pg/mL  Troponin I  Result Value Ref Range   Troponin I 0.03 (HH) <0.03 ng/mL       EKG  EKG Interpretation  Date/Time:  Tuesday May 04 2017 06:28:42 EST Ventricular Rate:  71 PR Interval:    QRS Duration: 160 QT Interval:  485 QTC Calculation: 520 R Axis:   -89 Text Interpretation:  A-V dual-paced complexes w/ some inhibition No further analysis attempted due to paced rhythm No significant change since last tracing 10 Aug 2014 Confirmed by Rolland Porter [phone]) on 05/04/2017 8:03:54 AM       Radiology Dg Chest 2 View  Result Date: 05/04/2017 CLINICAL DATA:  Dyspnea EXAM: CHEST  2 VIEW COMPARISON:  11/06/2015 chest radiograph. FINDINGS: Stable configuration of 3 lead left subclavian pacemaker. Intact sternotomy wires. CABG clips overlie the mediastinum. Stable cardiomediastinal silhouette with normal heart size. No pneumothorax. No pleural effusion. Lungs appear clear, with no acute consolidative airspace disease and no pulmonary edema. IMPRESSION: No active cardiopulmonary disease. Electronically Signed   By: Ilona Sorrel M.D.   On: 05/04/2017 02:11    Ct Head Wo Contrast  Result Date: 05/01/2017 CLINICAL DATA:  Intractable headaches.  IMPRESSION: No focal acute intracranial abnormality is identified. Electronically Signed   By: Abelardo Diesel M.D.   On: 05/01/2017 19:30    Procedures .Critical Care Performed by: Rolland Porter, MD Authorized by: Rolland Porter, MD   Critical care provider statement:    Critical care time (minutes):  31   Critical care was necessary to treat or prevent imminent or life-threatening deterioration of the following conditions:  Respiratory failure   Critical care was time spent personally by me on the following activities:  Discussions with consultants, evaluation of patient's response to  treatment, examination of patient, obtaining history from patient or surrogate, ordering and performing treatments and interventions, ordering and review of laboratory studies, ordering and review of radiographic studies, pulse oximetry and re-evaluation of patient's condition   (including critical care time)  Medications Ordered in ED Medications  albuterol (PROVENTIL) (2.5 MG/3ML) 0.083% nebulizer solution 5 mg (5 mg Nebulization Given 05/04/17 0036)  methylPREDNISolone sodium succinate (SOLU-MEDROL) 125 mg/2 mL injection 125 mg (125 mg Intravenous Given 05/04/17 0215)  albuterol (PROVENTIL,VENTOLIN) solution continuous neb (10 mg/hr Nebulization Given 05/04/17 0231)  ipratropium (ATROVENT) nebulizer solution 0.5 mg (0.5 mg Nebulization Given 05/04/17 0231)  albuterol (PROVENTIL) (2.5 MG/3ML) 0.083% nebulizer solution 5 mg (5 mg Nebulization Given 05/04/17 0548)  ipratropium (ATROVENT) nebulizer solution 0.5 mg (0.5 mg Nebulization Given 05/04/17 0548)     Initial Impression / Assessment and Plan / ED Course  I have reviewed the triage vital signs and the nursing notes.  Pertinent labs & imaging results that were available during my care of the patient were reviewed by me and considered in my medical decision making (see chart for details).    Patient had already received a nebulizer prior to my exam.  She was given a continuous nebulizer and also given a dose IV  Solu-Medrol.  We discussed her test results which only showed a mild leukocytosis which would be expected after being on steroids.  I looked at her chest x-ray did not see anything obvious as far as infiltrate.  Recheck 330, patient still has diffuse wheezing or rhonchi.  However she still very shaky from her nebulizer treatment.  She is noted to have some irregular beats on the monitor.  We decided to let her wait before we give another nebulizer.  Recheck 5 AM patient still has diffuse wheezing and rhonchi.  Her shaking is gone.  The  monitor now shows a regular  rhythm pacemaker induced.  The next nebulizer was ordered and I discussed admission.  05:18 AM Dr Darrick Meigs, will admit  Final Clinical Impressions(s) / ED Diagnoses   Final diagnoses:  Mild intermittent asthma with exacerbation    Plan admission  Rolland Porter, MD, Barbette Or, MD 05/04/17 [place], [place], MD 05/04/17 [phone]

## 2017-05-05 DIAGNOSIS — Z888 Allergy status to other drugs, medicaments and biological substances status: Secondary | ICD-10-CM | POA: Diagnosis not present

## 2017-05-05 DIAGNOSIS — F419 Anxiety disorder, unspecified: Secondary | ICD-10-CM | POA: Diagnosis present

## 2017-05-05 DIAGNOSIS — I482 Chronic atrial fibrillation: Secondary | ICD-10-CM | POA: Diagnosis present

## 2017-05-05 DIAGNOSIS — Z885 Allergy status to narcotic agent status: Secondary | ICD-10-CM | POA: Diagnosis not present

## 2017-05-05 DIAGNOSIS — E782 Mixed hyperlipidemia: Secondary | ICD-10-CM | POA: Diagnosis present

## 2017-05-05 DIAGNOSIS — R748 Abnormal levels of other serum enzymes: Secondary | ICD-10-CM | POA: Diagnosis present

## 2017-05-05 DIAGNOSIS — J45901 Unspecified asthma with (acute) exacerbation: Secondary | ICD-10-CM | POA: Diagnosis present

## 2017-05-05 DIAGNOSIS — Z951 Presence of aortocoronary bypass graft: Secondary | ICD-10-CM | POA: Diagnosis not present

## 2017-05-05 DIAGNOSIS — J441 Chronic obstructive pulmonary disease with (acute) exacerbation: Secondary | ICD-10-CM | POA: Diagnosis present

## 2017-05-05 DIAGNOSIS — Z955 Presence of coronary angioplasty implant and graft: Secondary | ICD-10-CM | POA: Diagnosis not present

## 2017-05-05 DIAGNOSIS — Z79899 Other long term (current) drug therapy: Secondary | ICD-10-CM | POA: Diagnosis not present

## 2017-05-05 DIAGNOSIS — Z8249 Family history of ischemic heart disease and other diseases of the circulatory system: Secondary | ICD-10-CM | POA: Diagnosis not present

## 2017-05-05 DIAGNOSIS — M1990 Unspecified osteoarthritis, unspecified site: Secondary | ICD-10-CM | POA: Diagnosis present

## 2017-05-05 DIAGNOSIS — J9601 Acute respiratory failure with hypoxia: Secondary | ICD-10-CM | POA: Diagnosis present

## 2017-05-05 DIAGNOSIS — N183 Chronic kidney disease, stage 3 (moderate): Secondary | ICD-10-CM | POA: Diagnosis present

## 2017-05-05 DIAGNOSIS — I442 Atrioventricular block, complete: Secondary | ICD-10-CM | POA: Diagnosis present

## 2017-05-05 DIAGNOSIS — Z95810 Presence of automatic (implantable) cardiac defibrillator: Secondary | ICD-10-CM | POA: Diagnosis not present

## 2017-05-05 DIAGNOSIS — I5032 Chronic diastolic (congestive) heart failure: Secondary | ICD-10-CM | POA: Diagnosis present

## 2017-05-05 DIAGNOSIS — I130 Hypertensive heart and chronic kidney disease with heart failure and stage 1 through stage 4 chronic kidney disease, or unspecified chronic kidney disease: Secondary | ICD-10-CM | POA: Diagnosis present

## 2017-05-05 DIAGNOSIS — Z7901 Long term (current) use of anticoagulants: Secondary | ICD-10-CM | POA: Diagnosis not present

## 2017-05-05 DIAGNOSIS — I2510 Atherosclerotic heart disease of native coronary artery without angina pectoris: Secondary | ICD-10-CM | POA: Diagnosis present

## 2017-05-05 LAB — CBC
HCT: 46 % (ref 36.0–46.0)
HEMOGLOBIN: 14.9 g/dL (ref 12.0–15.0)
MCH: 31.3 pg (ref 26.0–34.0)
MCHC: 32.4 g/dL (ref 30.0–36.0)
MCV: 96.6 fL (ref 78.0–100.0)
PLATELETS: 169 10*3/uL (ref 150–400)
RBC: 4.76 MIL/uL (ref 3.87–5.11)
RDW: 13.9 % (ref 11.5–15.5)
WBC: 15.6 10*3/uL — ABNORMAL HIGH (ref 4.0–10.5)

## 2017-05-05 LAB — PROTIME-INR
INR: 2.09
PROTHROMBIN TIME: 23.3 s — AB (ref 11.4–15.2)

## 2017-05-05 LAB — COMPREHENSIVE METABOLIC PANEL
ALT: 24 U/L (ref 14–54)
AST: 28 U/L (ref 15–41)
Albumin: 4 g/dL (ref 3.5–5.0)
Alkaline Phosphatase: 36 U/L — ABNORMAL LOW (ref 38–126)
Anion gap: 13 (ref 5–15)
BUN: 46 mg/dL — ABNORMAL HIGH (ref 6–20)
CO2: 24 mmol/L (ref 22–32)
Calcium: 9.1 mg/dL (ref 8.9–10.3)
Chloride: 105 mmol/L (ref 101–111)
Creatinine, Ser: 1.26 mg/dL — ABNORMAL HIGH (ref 0.44–1.00)
GFR, EST AFRICAN AMERICAN: 44 mL/min — AB (ref >60)
GFR, EST NON AFRICAN AMERICAN: 38 mL/min — AB (ref >60)
Glucose, Bld: 164 mg/dL — ABNORMAL HIGH (ref 65–99)
POTASSIUM: 4.7 mmol/L (ref 3.5–5.1)
SODIUM: 142 mmol/L (ref 135–145)
Total Bilirubin: 0.8 mg/dL (ref 0.3–1.2)
Total Protein: 7.1 g/dL (ref 6.5–8.1)

## 2017-05-05 MED ORDER — IPRATROPIUM-ALBUTEROL 0.5-2.5 (3) MG/3ML IN SOLN
3.0000 mL | Freq: Four times a day (QID) | RESPIRATORY_TRACT | Status: DC
  Administered 2017-05-05 – 2017-05-07 (×8): 3 mL via RESPIRATORY_TRACT
  Filled 2017-05-05 (×8): qty 3

## 2017-05-05 MED ORDER — WARFARIN SODIUM 2.5 MG PO TABS
2.5000 mg | ORAL_TABLET | Freq: Once | ORAL | Status: AC
  Administered 2017-05-05: 2.5 mg via ORAL
  Filled 2017-05-05: qty 1

## 2017-05-05 MED ORDER — LEVOFLOXACIN IN D5W 500 MG/100ML IV SOLN
500.0000 mg | INTRAVENOUS | Status: DC
  Administered 2017-05-05: 500 mg via INTRAVENOUS
  Filled 2017-05-05 (×2): qty 100

## 2017-05-05 NOTE — Progress Notes (Signed)
Subjective: She says she feels better.  She is still more short of breath than usual.  She had been on outpatient antibiotics and steroids and failed that.  She is still wheezing audibly without the use of a stethoscope.  Her troponin level has been elevated but I do not think she is having any sort of problem like an acute coronary syndrome.  She wants to have me let her cardiologist know that she has had elevated troponin level and I will do that  Objective: Vital signs in last 24 hours: Temp:  [97.6 F (36.4 C)-98.5 F (36.9 C)] 97.6 F (36.4 C) (01/16 0620) Pulse Rate:  [54-75] 75 (01/16 0620) Resp:  [21-25] 22 (01/16 0620) BP: (132-155)/(52-77) 154/77 (01/16 0620) SpO2:  [89 %-97 %] 97 % (01/16 0747) Weight change:  Last BM Date: 05/04/17  Intake/Output from previous day: 01/15 0701 - 01/16 0700 In: 720 [P.O.:720] Out: -   PHYSICAL EXAM General appearance: alert, cooperative and mild distress Resp: wheezes bilaterally and Prolonged expiratory phase Cardio: irregularly irregular rhythm GI: soft, non-tender; bowel sounds normal; no masses,  no organomegaly Extremities: extremities normal, atraumatic, no cyanosis or edema She is anxious  Lab Results:  Results for orders placed or performed during the hospital encounter of 05/04/17 (from the past 48 hour(s))  CBC with Differential     Status: Abnormal   Collection Time: 05/04/17 12:21 AM  Result Value Ref Range   WBC 11.1 (H) 4.0 - 10.5 K/uL   RBC 5.02 3.87 - 5.11 MIL/uL   Hemoglobin 15.6 (H) 12.0 - 15.0 g/dL   HCT 47.7 (H) 36.0 - 46.0 %   MCV 95.0 78.0 - 100.0 fL   MCH 31.1 26.0 - 34.0 pg   MCHC 32.7 30.0 - 36.0 g/dL   RDW 13.8 11.5 - 15.5 %   Platelets 165 150 - 400 K/uL   Neutrophils Relative % 65 %   Neutro Abs 7.1 1.7 - 7.7 K/uL   Lymphocytes Relative 14 %   Lymphs Abs 1.6 0.7 - 4.0 K/uL   Monocytes Relative 20 %   Monocytes Absolute 2.2 (H) 0.1 - 1.0 K/uL   Eosinophils Relative 1 %   Eosinophils Absolute 0.1  0.0 - 0.7 K/uL   Basophils Relative 0 %   Basophils Absolute 0.0 0.0 - 0.1 K/uL  Comprehensive metabolic panel     Status: Abnormal   Collection Time: 05/04/17 12:21 AM  Result Value Ref Range   Sodium 141 135 - 145 mmol/L   Potassium 4.2 3.5 - 5.1 mmol/L   Chloride 100 (L) 101 - 111 mmol/L   CO2 27 22 - 32 mmol/L   Glucose, Bld 118 (H) 65 - 99 mg/dL   BUN 31 (H) 6 - 20 mg/dL   Creatinine, Ser 1.36 (H) 0.44 - 1.00 mg/dL   Calcium 9.5 8.9 - 10.3 mg/dL   Total Protein 7.9 6.5 - 8.1 g/dL   Albumin 4.5 3.5 - 5.0 g/dL   AST 26 15 - 41 U/L   ALT 24 14 - 54 U/L   Alkaline Phosphatase 44 38 - 126 U/L   Total Bilirubin 1.2 0.3 - 1.2 mg/dL   GFR calc non Af Amer 35 (L) >60 mL/min   GFR calc Af Amer 40 (L) >60 mL/min    Comment: (NOTE) The eGFR has been calculated using the CKD EPI equation. This calculation has not been validated in all clinical situations. eGFR's persistently <60 mL/min signify possible Chronic Kidney Disease.    Anion  gap 14 5 - 15  Protime-INR     Status: Abnormal   Collection Time: 05/04/17 12:22 AM  Result Value Ref Range   Prothrombin Time 19.9 (H) 11.4 - 15.2 seconds   INR 1.71   Brain natriuretic peptide     Status: Abnormal   Collection Time: 05/04/17 12:27 AM  Result Value Ref Range   B Natriuretic Peptide 254.0 (H) 0.0 - 100.0 pg/mL  Troponin I     Status: Abnormal   Collection Time: 05/04/17 12:27 AM  Result Value Ref Range   Troponin I 0.03 (HH) <0.03 ng/mL    Comment: CRITICAL RESULT CALLED TO, READ BACK BY AND VERIFIED WITH: DOSS,M @ 0255 ON 01.15.19 BY BOWMAN,L   Troponin I (q 6hr x 3)     Status: Abnormal   Collection Time: 05/04/17 10:25 AM  Result Value Ref Range   Troponin I 0.06 (HH) <0.03 ng/mL    Comment: CRITICAL RESULT CALLED TO, READ BACK BY AND VERIFIED WITH: MARTIN,D AT 11:25AM ON 05/04/17 BY FESTERMAN,C   Troponin I (q 6hr x 3)     Status: Abnormal   Collection Time: 05/04/17  3:58 PM  Result Value Ref Range   Troponin I 0.05  (HH) <0.03 ng/mL    Comment: CRITICAL VALUE NOTED.  VALUE IS CONSISTENT WITH PREVIOUSLY REPORTED AND CALLED VALUE.  Troponin I (q 6hr x 3)     Status: Abnormal   Collection Time: 05/04/17  9:31 PM  Result Value Ref Range   Troponin I 0.05 (HH) <0.03 ng/mL    Comment: CRITICAL VALUE NOTED.  VALUE IS CONSISTENT WITH PREVIOUSLY REPORTED AND CALLED VALUE.  CBC     Status: Abnormal   Collection Time: 05/05/17  5:28 AM  Result Value Ref Range   WBC 15.6 (H) 4.0 - 10.5 K/uL   RBC 4.76 3.87 - 5.11 MIL/uL   Hemoglobin 14.9 12.0 - 15.0 g/dL   HCT 46.0 36.0 - 46.0 %   MCV 96.6 78.0 - 100.0 fL   MCH 31.3 26.0 - 34.0 pg   MCHC 32.4 30.0 - 36.0 g/dL   RDW 13.9 11.5 - 15.5 %   Platelets 169 150 - 400 K/uL  Comprehensive metabolic panel     Status: Abnormal   Collection Time: 05/05/17  5:28 AM  Result Value Ref Range   Sodium 142 135 - 145 mmol/L   Potassium 4.7 3.5 - 5.1 mmol/L   Chloride 105 101 - 111 mmol/L   CO2 24 22 - 32 mmol/L   Glucose, Bld 164 (H) 65 - 99 mg/dL   BUN 46 (H) 6 - 20 mg/dL   Creatinine, Ser 1.26 (H) 0.44 - 1.00 mg/dL   Calcium 9.1 8.9 - 10.3 mg/dL   Total Protein 7.1 6.5 - 8.1 g/dL   Albumin 4.0 3.5 - 5.0 g/dL   AST 28 15 - 41 U/L   ALT 24 14 - 54 U/L   Alkaline Phosphatase 36 (L) 38 - 126 U/L   Total Bilirubin 0.8 0.3 - 1.2 mg/dL   GFR calc non Af Amer 38 (L) >60 mL/min   GFR calc Af Amer 44 (L) >60 mL/min    Comment: (NOTE) The eGFR has been calculated using the CKD EPI equation. This calculation has not been validated in all clinical situations. eGFR's persistently <60 mL/min signify possible Chronic Kidney Disease.    Anion gap 13 5 - 15  Protime-INR     Status: Abnormal   Collection Time: 05/05/17  5:28 AM  Result Value Ref Range   Prothrombin Time 23.3 (H) 11.4 - 15.2 seconds   INR 2.09     ABGS No results for input(s): PHART, PO2ART, TCO2, HCO3 in the last 72 hours.  Invalid input(s): PCO2 CULTURES No results found for this or any previous visit  (from the past 240 hour(s)). Studies/Results: Dg Chest 2 View  Result Date: 05/04/2017 CLINICAL DATA:  Dyspnea EXAM: CHEST  2 VIEW COMPARISON:  11/06/2015 chest radiograph. FINDINGS: Stable configuration of 3 lead left subclavian pacemaker. Intact sternotomy wires. CABG clips overlie the mediastinum. Stable cardiomediastinal silhouette with normal heart size. No pneumothorax. No pleural effusion. Lungs appear clear, with no acute consolidative airspace disease and no pulmonary edema. IMPRESSION: No active cardiopulmonary disease. Electronically Signed   By: Ilona Sorrel M.D.   On: 05/04/2017 02:11    Medications:  Prior to Admission:  Medications Prior to Admission  Medication Sig Dispense Refill Last Dose  . acetaminophen (TYLENOL) 500 MG tablet Take 500 mg by mouth daily.    05/03/2017 at Unknown time  . albuterol (PROVENTIL HFA;VENTOLIN HFA) 108 (90 BASE) MCG/ACT inhaler Inhale 2 puffs into the lungs every 6 (six) hours as needed for wheezing or shortness of breath. 1 Inhaler 2 unknown  . amLODipine (NORVASC) 5 MG tablet TAKE (1) TABLET BY MOUTH ONCE DAILY. 90 tablet 3 05/03/2017 at Unknown time  . atorvastatin (LIPITOR) 20 MG tablet TAKE ONE TABLET BY MOUTH ONCE DAILY. 90 tablet 3 05/03/2017 at Unknown time  . Calcium Carbonate-Vitamin D (CALCIUM 600 + D PO) Take 1 tablet by mouth 2 (two) times daily.   05/03/2017 at Unknown time  . Cholecalciferol (VITAMIN D) 2000 UNITS CAPS Take 1 capsule by mouth at bedtime.    05/03/2017 at Unknown time  . dofetilide (TIKOSYN) 125 MCG capsule TAKE (1) CAPSULE BY MOUTH TWICE DAILY. (Patient taking differently: TAKE (1) CAPSULE BY MOUTH EVERY 12 HOURS.) 180 capsule 1 05/03/2017 at 2235  . folic acid (FOLVITE) 1 MG tablet Take 1 mg by mouth 2 (two) times daily.    05/03/2017 at Unknown time  . furosemide (LASIX) 40 MG tablet Take one tablet by mouth every morning   05/03/2017 at Unknown time  . losartan (COZAAR) 100 MG tablet TAKE ONE TABLET BY MOUTH DAILY. 90  tablet 3 05/03/2017 at Unknown time  . metoprolol succinate (TOPROL-XL) 100 MG 24 hr tablet TAKE ONE TABLET BY MOUTH EVERY MORNING. TAKE WITH OR IMMEDIATELY FOLLOWING A MEAL. 90 tablet 3 05/03/2017 at 0900  . Polyethyl Glycol-Propyl Glycol (SYSTANE OP) Place 1 drop into both eyes daily as needed (Dry eyes).   Past Month at Unknown time  . potassium chloride SA (K-DUR,KLOR-CON) 20 MEQ tablet Take 20 mEq by mouth 3 (three) times daily.   05/03/2017 at Unknown time  . predniSONE (DELTASONE) 10 MG tablet Take 10-40 mg by mouth as directed. Take 4 tabs daily for 3 days, 3 tabs daily for 3 days, 2 tabs daily for 3 days,, 1 tab daily for 3 days. Patient on the last day.   05/03/2017 at Unknown time  . warfarin (COUMADIN) 2.5 MG tablet Take 1 1/2 tablets daily except 1 tablet on Mondays, Wednesdays and Fridays (Patient taking differently: Take 2.5-3.75 mg by mouth as directed. Take 1 1/2 tablets daily except 1 tablet on Mondays and Thursdays.) 45 tablet 6 05/03/2017 at Unknown time  . doxycycline (VIBRA-TABS) 100 MG tablet Take 100 mg by mouth 2 (two) times daily. 10 day course. Finished on Sunday 05/02/17.  Scheduled: . acetaminophen  500 mg Oral Daily  . amLODipine  5 mg Oral Daily  . atorvastatin  20 mg Oral Daily  . dofetilide  125 mcg Oral BID  . furosemide  40 mg Oral Daily  . guaiFENesin  600 mg Oral BID  . ipratropium-albuterol  3 mL Nebulization QID  . losartan  100 mg Oral Daily  . methylPREDNISolone (SOLU-MEDROL) injection  60 mg Intravenous Q6H  . metoprolol succinate  100 mg Oral Daily  . potassium chloride SA  20 mEq Oral TID  . sodium chloride flush  3 mL Intravenous Q12H  . Warfarin - Pharmacist Dosing Inpatient   Does not apply Q24H   Continuous: . sodium chloride     [id number] chloride, ondansetron **OR** ondansetron (ZOFRAN) IV, sodium chloride flush  Assesment: She was admitted with COPD exacerbation.  She is better but not ready for discharge.  She failed outpatient  therapy.  She has chronic diastolic heart failure but it does not look like she is having an active issue with that right now.  She has elevated troponin level which is flat and now actually trending downward and I think is related to her other issues that are occurring now  She has chronic atrial fib and she is chronically anticoagulated  She has coronary disease which is stable.  Her EKG did not show any significant changes but it was a paced rhythm. Active Problems:   Essential hypertension, benign   Atrial fibrillation (HCC)   Chronic diastolic heart failure (HCC)   COPD exacerbation (HCC)   Chronic obstructive asthma with acute exacerbation ([place])    Plan: Continue current treatments.  We will plan to continue steroids inhaled bronchodilators antibiotics and I will add a flutter valve.  I am going to have her admitted.    LOS: 0 days   Dayden Viverette L 05/05/2017, 8:31 AM

## 2017-05-05 NOTE — Progress Notes (Signed)
Pharmacy Antibiotic Note  [name] [name] is a 84 y.o. female admitted on 05/04/2017 with COPD exacerbation.  Pharmacy has been consulted for Levaquin dosing. Patient will also continue on coumadin, INR today is therapuetic at 2.09.  Plan: Levaquin 500 mg IV Q 48 hours.  Monitor labs, symptoms, and C/S Warfarin 2.5 mg today Monitor daily INR, s/s of bleeding, and CBC every 72 hours  Height: 5' (152.4 cm) Weight: 166 lb (75.3 kg) IBW/kg (Calculated) : 45.5  Temp (24hrs), Avg:97.9 F (36.6 C), Min:97.6 F (36.4 C), Max:98.5 F (36.9 C)  Recent Labs  Lab 05/04/17 0021 05/05/17 0528  WBC 11.1* 15.6*  CREATININE 1.36* 1.26*    Estimated Creatinine Clearance: 30.7 mL/min (A) (by C-G formula based on SCr of 1.26 mg/dL (H)).    Allergies  Allergen Reactions  . Codeine Nausea And Vomiting  . Keflex [Cephalexin] Itching and Rash    Antimicrobials this admission: Levaqin 01/16 >>    Dose adjustments this admission:   Microbiology results: N/A  Thank you for allowing pharmacy to be a part of this patient's care.   Margot Ables, PharmD Clinical Pharmacist 05/05/2017 9:30 AM

## 2017-05-06 LAB — PROTIME-INR
INR: 2.6
PROTHROMBIN TIME: 27.6 s — AB (ref 11.4–15.2)

## 2017-05-06 MED ORDER — DOXYCYCLINE HYCLATE 100 MG PO TABS
100.0000 mg | ORAL_TABLET | Freq: Two times a day (BID) | ORAL | Status: DC
  Administered 2017-05-06 – 2017-05-07 (×3): 100 mg via ORAL
  Filled 2017-05-06 (×3): qty 1

## 2017-05-06 MED ORDER — WARFARIN SODIUM 1 MG PO TABS
1.0000 mg | ORAL_TABLET | Freq: Once | ORAL | Status: AC
  Administered 2017-05-06: 1 mg via ORAL
  Filled 2017-05-06: qty 1

## 2017-05-06 NOTE — Progress Notes (Signed)
[facility] for Coumadin (home med) Indication: atrial fibrillation  Allergies  Allergen Reactions  . Codeine Nausea And Vomiting  . Keflex [Cephalexin] Itching and Rash    Patient Measurements: Height: 5' (152.4 cm) Weight: 166 lb (75.3 kg) IBW/kg (Calculated) : 45.5  Vital Signs: Temp: 98 F (36.7 C) (01/17 0615) Temp Source: Oral (01/17 0615) BP: 136/56 (01/17 0615) Pulse Rate: 67 (01/17 0615)  Labs: Recent Labs    05/04/17 0021 05/04/17 0022  05/04/17 1025 05/04/17 1558 05/04/17 2131 05/05/17 0528 05/06/17 0546  HGB 15.6*  --   --   --   --   --  14.9  --   HCT 47.7*  --   --   --   --   --  46.0  --   PLT 165  --   --   --   --   --  169  --   LABPROT  --  19.9*  --   --   --   --  23.3* 27.6*  INR  --  1.71  --   --   --   --  2.09 2.60  CREATININE 1.36*  --   --   --   --   --  1.26*  --   TROPONINI  --   --    < > 0.06* 0.05* 0.05*  --   --    < > = values in this interval not displayed.    Estimated Creatinine Clearance: 30.7 mL/min (A) (by C-G formula based on SCr of 1.26 mg/dL (H)).  Assessment: 84yo female on chronic coumadin for h/o afib.  INR remains therapeutic, trending up now. No bleeding noted.  Goal of Therapy:  INR 2-3 Monitor platelets by anticoagulation protocol: Yes   Plan:  Coumadin 1mg  today x 1 INR daily Monitor for signs and symptoms of bleeding.   Pricilla Larsson 05/06/2017,11:07 AM

## 2017-05-06 NOTE — Progress Notes (Signed)
Subjective: She says she feels better.  She has improved as far as her breathing is concerned.  She is still short of breath with exertion.  No chest pain.  No swelling of her legs.  Objective: Vital signs in last 24 hours: Temp:  [97.9 F (36.6 C)-98.1 F (36.7 C)] 98 F (36.7 C) (01/17 0615) Pulse Rate:  [61-67] 67 (01/17 0615) Resp:  [20] 20 (01/17 0615) BP: (134-139)/(49-60) 136/56 (01/17 0615) SpO2:  [95 %-98 %] 96 % (01/17 0749) Weight change:  Last BM Date: 05/04/17  Intake/Output from previous day: 01/16 0701 - 01/17 0700 In: 103 [I.V.:3; IV Piggyback:100] Out: -   PHYSICAL EXAM General appearance: alert, cooperative and mild distress Resp: She still has end expiratory wheezing but is moving air much better Cardio: irregularly irregular rhythm GI: soft, non-tender; bowel sounds normal; no masses,  no organomegaly Extremities: extremities normal, atraumatic, no cyanosis or edema Very anxious  Lab Results:  Results for orders placed or performed during the hospital encounter of 05/04/17 (from the past 48 hour(s))  Troponin I (q 6hr x 3)     Status: Abnormal   Collection Time: 05/04/17 10:25 AM  Result Value Ref Range   Troponin I 0.06 (HH) <0.03 ng/mL    Comment: CRITICAL RESULT CALLED TO, READ BACK BY AND VERIFIED WITH: MARTIN,D AT 11:25AM ON 05/04/17 BY FESTERMAN,C   Troponin I (q 6hr x 3)     Status: Abnormal   Collection Time: 05/04/17  3:58 PM  Result Value Ref Range   Troponin I 0.05 (HH) <0.03 ng/mL    Comment: CRITICAL VALUE NOTED.  VALUE IS CONSISTENT WITH PREVIOUSLY REPORTED AND CALLED VALUE.  Troponin I (q 6hr x 3)     Status: Abnormal   Collection Time: 05/04/17  9:31 PM  Result Value Ref Range   Troponin I 0.05 (HH) <0.03 ng/mL    Comment: CRITICAL VALUE NOTED.  VALUE IS CONSISTENT WITH PREVIOUSLY REPORTED AND CALLED VALUE.  CBC     Status: Abnormal   Collection Time: 05/05/17  5:28 AM  Result Value Ref Range   WBC 15.6 (H) 4.0 - 10.5 K/uL   RBC  4.76 3.87 - 5.11 MIL/uL   Hemoglobin 14.9 12.0 - 15.0 g/dL   HCT 46.0 36.0 - 46.0 %   MCV 96.6 78.0 - 100.0 fL   MCH 31.3 26.0 - 34.0 pg   MCHC 32.4 30.0 - 36.0 g/dL   RDW 13.9 11.5 - 15.5 %   Platelets 169 150 - 400 K/uL  Comprehensive metabolic panel     Status: Abnormal   Collection Time: 05/05/17  5:28 AM  Result Value Ref Range   Sodium 142 135 - 145 mmol/L   Potassium 4.7 3.5 - 5.1 mmol/L   Chloride 105 101 - 111 mmol/L   CO2 24 22 - 32 mmol/L   Glucose, Bld 164 (H) 65 - 99 mg/dL   BUN 46 (H) 6 - 20 mg/dL   Creatinine, Ser 1.26 (H) 0.44 - 1.00 mg/dL   Calcium 9.1 8.9 - 10.3 mg/dL   Total Protein 7.1 6.5 - 8.1 g/dL   Albumin 4.0 3.5 - 5.0 g/dL   AST 28 15 - 41 U/L   ALT 24 14 - 54 U/L   Alkaline Phosphatase 36 (L) 38 - 126 U/L   Total Bilirubin 0.8 0.3 - 1.2 mg/dL   GFR calc non Af Amer 38 (L) >60 mL/min   GFR calc Af Amer 44 (L) >60 mL/min  Comment: (NOTE) The eGFR has been calculated using the CKD EPI equation. This calculation has not been validated in all clinical situations. eGFR's persistently <60 mL/min signify possible Chronic Kidney Disease.    Anion gap 13 5 - 15  Protime-INR     Status: Abnormal   Collection Time: 05/05/17  5:28 AM  Result Value Ref Range   Prothrombin Time 23.3 (H) 11.4 - 15.2 seconds   INR 2.09   Protime-INR     Status: Abnormal   Collection Time: 05/06/17  5:46 AM  Result Value Ref Range   Prothrombin Time 27.6 (H) 11.4 - 15.2 seconds   INR 2.60     ABGS No results for input(s): PHART, PO2ART, TCO2, HCO3 in the last 72 hours.  Invalid input(s): PCO2 CULTURES No results found for this or any previous visit (from the past 240 hour(s)). Studies/Results: No results found.  Medications:  Prior to Admission:  Medications Prior to Admission  Medication Sig Dispense Refill Last Dose  . acetaminophen (TYLENOL) 500 MG tablet Take 500 mg by mouth daily.    05/03/2017 at Unknown time  . albuterol (PROVENTIL HFA;VENTOLIN HFA) 108 (90  BASE) MCG/ACT inhaler Inhale 2 puffs into the lungs every 6 (six) hours as needed for wheezing or shortness of breath. 1 Inhaler 2 unknown  . amLODipine (NORVASC) 5 MG tablet TAKE (1) TABLET BY MOUTH ONCE DAILY. 90 tablet 3 05/03/2017 at Unknown time  . atorvastatin (LIPITOR) 20 MG tablet TAKE ONE TABLET BY MOUTH ONCE DAILY. 90 tablet 3 05/03/2017 at Unknown time  . Calcium Carbonate-Vitamin D (CALCIUM 600 + D PO) Take 1 tablet by mouth 2 (two) times daily.   05/03/2017 at Unknown time  . Cholecalciferol (VITAMIN D) 2000 UNITS CAPS Take 1 capsule by mouth at bedtime.    05/03/2017 at Unknown time  . dofetilide (TIKOSYN) 125 MCG capsule TAKE (1) CAPSULE BY MOUTH TWICE DAILY. (Patient taking differently: TAKE (1) CAPSULE BY MOUTH EVERY 12 HOURS.) 180 capsule 1 05/03/2017 at 2235  . folic acid (FOLVITE) 1 MG tablet Take 1 mg by mouth 2 (two) times daily.    05/03/2017 at Unknown time  . furosemide (LASIX) 40 MG tablet Take one tablet by mouth every morning   05/03/2017 at Unknown time  . losartan (COZAAR) 100 MG tablet TAKE ONE TABLET BY MOUTH DAILY. 90 tablet 3 05/03/2017 at Unknown time  . metoprolol succinate (TOPROL-XL) 100 MG 24 hr tablet TAKE ONE TABLET BY MOUTH EVERY MORNING. TAKE WITH OR IMMEDIATELY FOLLOWING A MEAL. 90 tablet 3 05/03/2017 at 0900  . Polyethyl Glycol-Propyl Glycol (SYSTANE OP) Place 1 drop into both eyes daily as needed (Dry eyes).   Past Month at Unknown time  . potassium chloride SA (K-DUR,KLOR-CON) 20 MEQ tablet Take 20 mEq by mouth 3 (three) times daily.   05/03/2017 at Unknown time  . predniSONE (DELTASONE) 10 MG tablet Take 10-40 mg by mouth as directed. Take 4 tabs daily for 3 days, 3 tabs daily for 3 days, 2 tabs daily for 3 days,, 1 tab daily for 3 days. Patient on the last day.   05/03/2017 at Unknown time  . warfarin (COUMADIN) 2.5 MG tablet Take 1 1/2 tablets daily except 1 tablet on Mondays, Wednesdays and Fridays (Patient taking differently: Take 2.5-3.75 mg by mouth as  directed. Take 1 1/2 tablets daily except 1 tablet on Mondays and Thursdays.) 45 tablet 6 05/03/2017 at Unknown time  . doxycycline (VIBRA-TABS) 100 MG tablet Take 100 mg by mouth 2 (  two) times daily. 10 day course. Finished on Sunday 05/02/17.      Scheduled: . acetaminophen  500 mg Oral Daily  . amLODipine  5 mg Oral Daily  . atorvastatin  20 mg Oral Daily  . dofetilide  125 mcg Oral BID  . furosemide  40 mg Oral Daily  . guaiFENesin  600 mg Oral BID  . ipratropium-albuterol  3 mL Nebulization QID  . losartan  100 mg Oral Daily  . methylPREDNISolone (SOLU-MEDROL) injection  60 mg Intravenous Q6H  . metoprolol succinate  100 mg Oral Daily  . potassium chloride SA  20 mEq Oral TID  . sodium chloride flush  3 mL Intravenous Q12H  . Warfarin - Pharmacist Dosing Inpatient   Does not apply Q24H   Continuous: . sodium chloride    . levofloxacin (LEVAQUIN) IV Stopped (05/05/17 1135)   [id number] chloride, ondansetron **OR** ondansetron (ZOFRAN) IV, sodium chloride flush  Assesment: She was admitted with COPD exacerbation.  She had failed outpatient treatment.  She is on IV steroids and on IV Levaquin.  She is improving.  She has hypertension which is well controlled.  She has chronic atrial fib and she is chronically anticoagulated  She has chronic diastolic heart failure but that appears to be well controlled at this point  She has had a pacemaker implanted.  She has anxiety and is on medication for that Active Problems:   Essential hypertension, benign   Atrial fibrillation (HCC)   Chronic diastolic heart failure (HCC)   COPD exacerbation (HCC)   Chronic obstructive asthma with acute exacerbation ([place])    Plan: Continue treatments.  Potential discharge tomorrow    LOS: 1 day   Salsabeel Gorelick L 05/06/2017, 8:39 AM

## 2017-05-07 DIAGNOSIS — N184 Chronic kidney disease, stage 4 (severe): Secondary | ICD-10-CM | POA: Diagnosis present

## 2017-05-07 DIAGNOSIS — D631 Anemia in chronic kidney disease: Secondary | ICD-10-CM | POA: Diagnosis present

## 2017-05-07 DIAGNOSIS — N183 Chronic kidney disease, stage 3 unspecified: Secondary | ICD-10-CM | POA: Diagnosis present

## 2017-05-07 DIAGNOSIS — J9601 Acute respiratory failure with hypoxia: Secondary | ICD-10-CM | POA: Diagnosis present

## 2017-05-07 LAB — PROTIME-INR
INR: 2.4
Prothrombin Time: 25.9 seconds — ABNORMAL HIGH (ref 11.4–15.2)

## 2017-05-07 MED ORDER — WARFARIN SODIUM 2.5 MG PO TABS: 2.5000 mg | ORAL_TABLET | ORAL | 5 refills | Status: DC

## 2017-05-07 MED ORDER — PREDNISONE 10 MG PO TABS: ORAL_TABLET | ORAL | 0 refills | Status: DC

## 2017-05-07 MED ORDER — DOXYCYCLINE HYCLATE 100 MG PO TABS: 100.0000 mg | ORAL_TABLET | Freq: Two times a day (BID) | ORAL | 0 refills | Status: DC

## 2017-05-07 MED ORDER — WARFARIN SODIUM 2.5 MG PO TABS: 2.5000 mg | ORAL_TABLET | Freq: Once | ORAL | Status: DC

## 2017-05-07 NOTE — Progress Notes (Signed)
Subjective: She says she feels much better.  She is still more short of breath than baseline but she has substantial shortness of breath even at her best.  She has no other new complaints.  She is coughing nonproductively.  Objective: Vital signs in last 24 hours: Temp:  [97.5 F (36.4 C)-97.9 F (36.6 C)] 97.5 F (36.4 C) (01/18 0533) Pulse Rate:  [64-73] 71 (01/18 0533) Resp:  [18-20] 18 (01/18 0533) BP: (117-151)/(67-80) 151/80 (01/18 0533) SpO2:  [95 %-98 %] 96 % (01/18 0533) Weight change:  Last BM Date: 05/06/17  Intake/Output from previous day: 01/17 0701 - 01/18 0700 In: 480 [P.O.:480] Out: -   PHYSICAL EXAM General appearance: alert, cooperative and mild distress Resp: Her chest is clear.  Her respiratory effort is still increased over baseline Cardio: irregularly irregular rhythm GI: soft, non-tender; bowel sounds normal; no masses,  no organomegaly Extremities: extremities normal, atraumatic, no cyanosis or edema She remains anxious  Lab Results:  Results for orders placed or performed during the hospital encounter of 05/04/17 (from the past 48 hour(s))  Protime-INR     Status: Abnormal   Collection Time: 05/06/17  5:46 AM  Result Value Ref Range   Prothrombin Time 27.6 (H) 11.4 - 15.2 seconds   INR 2.60   Protime-INR     Status: Abnormal   Collection Time: 05/07/17  4:54 AM  Result Value Ref Range   Prothrombin Time 25.9 (H) 11.4 - 15.2 seconds   INR 2.40     ABGS No results for input(s): PHART, PO2ART, TCO2, HCO3 in the last 72 hours.  Invalid input(s): PCO2 CULTURES No results found for this or any previous visit (from the past 240 hour(s)). Studies/Results: No results found.  Medications:  Prior to Admission:  Medications Prior to Admission  Medication Sig Dispense Refill Last Dose  . acetaminophen (TYLENOL) 500 MG tablet Take 500 mg by mouth daily.    05/03/2017 at Unknown time  . albuterol (PROVENTIL HFA;VENTOLIN HFA) 108 (90 BASE) MCG/ACT  inhaler Inhale 2 puffs into the lungs every 6 (six) hours as needed for wheezing or shortness of breath. 1 Inhaler 2 unknown  . amLODipine (NORVASC) 5 MG tablet TAKE (1) TABLET BY MOUTH ONCE DAILY. 90 tablet 3 05/03/2017 at Unknown time  . atorvastatin (LIPITOR) 20 MG tablet TAKE ONE TABLET BY MOUTH ONCE DAILY. 90 tablet 3 05/03/2017 at Unknown time  . Calcium Carbonate-Vitamin D (CALCIUM 600 + D PO) Take 1 tablet by mouth 2 (two) times daily.   05/03/2017 at Unknown time  . Cholecalciferol (VITAMIN D) 2000 UNITS CAPS Take 1 capsule by mouth at bedtime.    05/03/2017 at Unknown time  . dofetilide (TIKOSYN) 125 MCG capsule TAKE (1) CAPSULE BY MOUTH TWICE DAILY. (Patient taking differently: TAKE (1) CAPSULE BY MOUTH EVERY 12 HOURS.) 180 capsule 1 05/03/2017 at 2235  . folic acid (FOLVITE) 1 MG tablet Take 1 mg by mouth 2 (two) times daily.    05/03/2017 at Unknown time  . furosemide (LASIX) 40 MG tablet Take 40-80 mg by mouth daily. Take one tablet by mouth every morning.  May take additional tablet as needed if wheezing due to fluid.   05/03/2017 at Unknown time  . losartan (COZAAR) 100 MG tablet TAKE ONE TABLET BY MOUTH DAILY. 90 tablet 3 05/03/2017 at Unknown time  . metoprolol succinate (TOPROL-XL) 100 MG 24 hr tablet TAKE ONE TABLET BY MOUTH EVERY MORNING. TAKE WITH OR IMMEDIATELY FOLLOWING A MEAL. 90 tablet 3 05/03/2017 at 0900  .  Polyethyl Glycol-Propyl Glycol (SYSTANE OP) Place 1 drop into both eyes daily as needed (Dry eyes).   Past Month at Unknown time  . potassium chloride SA (K-DUR,KLOR-CON) 20 MEQ tablet Take 20 mEq by mouth 3 (three) times daily.   05/03/2017 at Unknown time  . predniSONE (DELTASONE) 10 MG tablet Take 10-40 mg by mouth as directed. Take 4 tabs daily for 3 days, 3 tabs daily for 3 days, 2 tabs daily for 3 days,, 1 tab daily for 3 days. Patient on the last day.   05/03/2017 at Unknown time  . warfarin (COUMADIN) 2.5 MG tablet Take 1 1/2 tablets daily except 1 tablet on Mondays,  Wednesdays and Fridays (Patient taking differently: Take 2.5-3.75 mg by mouth as directed. Take 1 1/2 tablets daily except 1 tablet on Mondays and Thursdays.) 45 tablet 6 05/03/2017 at Unknown time  . doxycycline (VIBRA-TABS) 100 MG tablet Take 100 mg by mouth 2 (two) times daily. 10 day course. Finished on Sunday 05/02/17.      Scheduled: . acetaminophen  500 mg Oral Daily  . amLODipine  5 mg Oral Daily  . atorvastatin  20 mg Oral Daily  . dofetilide  125 mcg Oral BID  . doxycycline  100 mg Oral BID  . furosemide  40 mg Oral Daily  . guaiFENesin  600 mg Oral BID  . ipratropium-albuterol  3 mL Nebulization QID  . losartan  100 mg Oral Daily  . methylPREDNISolone (SOLU-MEDROL) injection  60 mg Intravenous Q6H  . metoprolol succinate  100 mg Oral Daily  . potassium chloride SA  20 mEq Oral TID  . sodium chloride flush  3 mL Intravenous Q12H  . Warfarin - Pharmacist Dosing Inpatient   Does not apply Q24H   Continuous: . sodium chloride     NOM:VEHMCN chloride, ondansetron **OR** ondansetron (ZOFRAN) IV, sodium chloride flush  Assesment: She was admitted with COPD exacerbation.  She is substantially better.  She has acute hypoxic respiratory failure and may need to go home on oxygen.  She will be evaluated to see if she needs to go home on oxygen.  She has chronic diastolic heart failure and I think her problem is just from her lung disease as I do not see any evidence of acute exacerbation of her chronic heart failure.  She has hypertension which is stable  She has anxiety which is stable  She has chronic atrial fib and she is chronically anticoagulated and that is being managed by pharmacy  She has stage III chronic kidney disease which is stable  She has a pacer defibrillator in place   Active Problems:   Essential hypertension, benign   Atrial fibrillation (HCC)   Chronic diastolic heart failure (HCC)   COPD exacerbation ([place])   Chronic obstructive asthma with acute  exacerbation ([place])    Plan: I think she is okay for discharge.  She will need home health.  She may need oxygen.    LOS: 2 days   Jasmond River L 05/07/2017, 8:18 AM

## 2017-05-07 NOTE — Progress Notes (Addendum)
Physician Discharge Summary  Patient ID: [name] [name] MRN: [id number] DOB/AGE: Oct 14, 1931 84 y.o. Primary Care Physician:Saintclair Schroader, Percell Miller, MD Admit date: 05/04/2017 Discharge date: 05/07/2017    Discharge Diagnoses:   Active Problems:   Essential hypertension, benign   Coronary atherosclerosis of native coronary artery   Automatic implantable cardioverter-defibrillator in situ   Atrial fibrillation (HCC)   Chronic diastolic heart failure (HCC)   COPD exacerbation (HCC)   Chronic obstructive asthma with acute exacerbation (HCC)   Acute respiratory failure with hypoxia (HCC)   Chronic kidney disease, stage III (moderate) (HCC)   Allergies as of 05/07/2017      Reactions   Codeine Nausea And Vomiting   Keflex [cephalexin] Itching, Rash      Medication List    TAKE these medications   acetaminophen 500 MG tablet Commonly known as:  TYLENOL Take 500 mg by mouth daily.   albuterol 108 (90 Base) MCG/ACT inhaler Commonly known as:  PROVENTIL HFA;VENTOLIN HFA Inhale 2 puffs into the lungs every 6 (six) hours as needed for wheezing or shortness of breath.   amLODipine 5 MG tablet Commonly known as:  NORVASC TAKE (1) TABLET BY MOUTH ONCE DAILY.   atorvastatin 20 MG tablet Commonly known as:  LIPITOR TAKE ONE TABLET BY MOUTH ONCE DAILY.   CALCIUM 600 + D PO Take 1 tablet by mouth 2 (two) times daily.   dofetilide 125 MCG capsule Commonly known as:  TIKOSYN TAKE (1) CAPSULE BY MOUTH TWICE DAILY. What changed:  See the new instructions.   doxycycline 100 MG tablet Commonly known as:  VIBRA-TABS Take 1 tablet (100 mg total) by mouth 2 (two) times daily. What changed:  additional instructions   folic acid 1 MG tablet Commonly known as:  FOLVITE Take 1 mg by mouth 2 (two) times daily.   furosemide 40 MG tablet Commonly known as:  LASIX Take 40-80 mg by mouth daily. Take one tablet by mouth every morning.  May take additional tablet as needed if wheezing due to fluid.    losartan 100 MG tablet Commonly known as:  COZAAR TAKE ONE TABLET BY MOUTH DAILY.   metoprolol succinate 100 MG 24 hr tablet Commonly known as:  TOPROL-XL TAKE ONE TABLET BY MOUTH EVERY MORNING. TAKE WITH OR IMMEDIATELY FOLLOWING A MEAL.   potassium chloride SA 20 MEQ tablet Commonly known as:  K-DUR,KLOR-CON Take 20 mEq by mouth 3 (three) times daily.   predniSONE 10 MG tablet Commonly known as:  DELTASONE 4 daily for 3 days, 3 daily for 3 days, 2 daily for 3 days, 1 daily for 3 days What changed:    how much to take  how to take this  when to take this  additional instructions   SYSTANE OP Place 1 drop into both eyes daily as needed (Dry eyes).   Vitamin D 2000 units Caps Take 1 capsule by mouth at bedtime.   warfarin 2.5 MG tablet Commonly known as:  COUMADIN Take as directed. If you are unsure how to take this medication, talk to your nurse or doctor. Original instructions:  Take 1-1.5 tablets (2.5-3.75 mg total) by mouth as directed. Take 1 1/2 tablets daily except 1 tablet on Mondays and Thursdays. What changed:    how much to take  how to take this  when to take this  additional instructions       Discharged Condition: Improved    Consults: None  Significant Diagnostic Studies: Dg Chest 2 View  Result Date: 05/04/2017 CLINICAL  DATA:  Dyspnea EXAM: CHEST  2 VIEW COMPARISON:  11/06/2015 chest radiograph. FINDINGS: Stable configuration of 3 lead left subclavian pacemaker. Intact sternotomy wires. CABG clips overlie the mediastinum. Stable cardiomediastinal silhouette with normal heart size. No pneumothorax. No pleural effusion. Lungs appear clear, with no acute consolidative airspace disease and no pulmonary edema. IMPRESSION: No active cardiopulmonary disease. Electronically Signed   By: Ilona Sorrel M.D.   On: 05/04/2017 02:11   Ct Head Wo Contrast  Result Date: 05/01/2017 CLINICAL DATA:  Intractable headaches. EXAM: CT HEAD WITHOUT CONTRAST  TECHNIQUE: Contiguous axial images were obtained from the base of the skull through the vertex without intravenous contrast. COMPARISON:  September 20, 2015 FINDINGS: Brain: No evidence of acute infarction, hemorrhage, hydrocephalus, extra-axial collection or mass lesion/mass effect. There is chronic diffuse atrophy. Vascular: No hyperdense vessel or unexpected calcification. Skull: Normal. Negative for fracture or focal lesion. Sinuses/Orbits: No acute finding. Other: None. IMPRESSION: No focal acute intracranial abnormality is identified. Electronically Signed   By: Abelardo Diesel M.D.   On: 05/01/2017 19:30    Lab Results: Basic Metabolic Panel: Recent Labs    05/05/17 0528  NA 142  K 4.7  CL 105  CO2 24  GLUCOSE 164*  BUN 46*  CREATININE 1.26*  CALCIUM 9.1   Liver Function Tests: Recent Labs    05/05/17 0528  AST 28  ALT 24  ALKPHOS 36*  BILITOT 0.8  PROT 7.1  ALBUMIN 4.0     CBC: Recent Labs    05/05/17 0528  WBC 15.6*  HGB 14.9  HCT 46.0  MCV 96.6  PLT 169    No results found for this or any previous visit (from the past 240 hour(s)).   Hospital Course: This is an 84 year old who became increasingly short of breath despite having taken an antibiotic and steroids.  She eventually presented to the emergency department and was found to have what seemed to be a severe COPD exacerbation.  Chest x-ray did not show any infiltrate.  She was started on IV steroids given bronchodilators and oxygen and improved.  By the time of discharge she was approaching baseline although she was still more short of breath than her baseline.  She feels like she is going to be able to manage at home.  She will have home health services.  She may need oxygen at home and she is being tested for that now.  Discharge Exam: Blood pressure (!) 151/80, pulse 71, temperature (!) 97.5 F (36.4 C), temperature source Oral, resp. rate 18, height 5' (1.524 m), weight 75.3 kg (166 lb), SpO2 96 %. She is  awake and alert.  Her chest is clear.  Her heart is irregular as always.  She does not have any edema.  Disposition: Home with home health services  Discharge Instructions    Face-to-face encounter (required for Medicare/Medicaid patients)   Complete by:  As directed    I Tarence Searcy L certify that this patient is under my care and that I, or a nurse practitioner or physician's assistant working with me, had a face-to-face encounter that meets the physician face-to-face encounter requirements with this patient on 05/07/2017. The encounter with the patient was in whole, or in part for the following medical condition(s) which is the primary reason for home health care (List medical condition): COPD exacerbation   The encounter with the patient was in whole, or in part, for the following medical condition, which is the primary reason for home health care:  COPD  exacerbation   I certify that, based on my findings, the following services are medically necessary home health services:  Nursing   Reason for Medically Necessary Home Health Services:  Skilled Nursing- Change/Decline in Patient Status   My clinical findings support the need for the above services:  Shortness of breath with activity   Further, I certify that my clinical findings support that this patient is homebound due to:  Shortness of Breath with activity   Home Health   Complete by:  As directed    To provide the following care/treatments:  RN   Please check PT/INR on 05/10/2017 and report to the Coumadin clinic at [facility] in [place] than 30 minutes were spent on this discharge activity   Signed: Junelle Hashemi L   05/07/2017, 8:31 AM

## 2017-05-07 NOTE — Progress Notes (Signed)
Ambulated pt in hall. O2 stats were between 92-96 on RA. Notified Dr. Luan Pulling.

## 2017-05-07 NOTE — Progress Notes (Signed)
Removed IV-clean, dry, intact. Reviewed d/c paperwork with pt and son. Reviewed new medications. Lyndsay the NT, wheeled stable pt to main entrance where she got into her son's car.

## 2017-05-07 NOTE — Care Management Important Message (Signed)
Important Message  Patient Details  Name: [name] [name] MRN: [id number] Date of Birth: 05/04/1931   Medicare Important Message Given:  Yes    Sherald Barge, RN 05/07/2017, 10:14 AM

## 2017-05-07 NOTE — Progress Notes (Signed)
[facility] for Coumadin (home med) Indication: atrial fibrillation  Allergies  Allergen Reactions  . Codeine Nausea And Vomiting  . Keflex [Cephalexin] Itching and Rash    Patient Measurements: Height: 5' (152.4 cm) Weight: 166 lb (75.3 kg) IBW/kg (Calculated) : 45.5  Vital Signs: Temp: 97.5 F (36.4 C) (01/18 0533) Temp Source: Oral (01/18 0533) BP: 151/80 (01/18 0533) Pulse Rate: 71 (01/18 0533)  Labs: Recent Labs    05/04/17 1025 05/04/17 1558 05/04/17 2131 05/05/17 0528 05/06/17 0546 05/07/17 0454  HGB  --   --   --  14.9  --   --   HCT  --   --   --  46.0  --   --   PLT  --   --   --  169  --   --   LABPROT  --   --   --  23.3* 27.6* 25.9*  INR  --   --   --  2.09 2.60 2.40  CREATININE  --   --   --  1.26*  --   --   TROPONINI 0.06* 0.05* 0.05*  --   --   --     Estimated Creatinine Clearance: 30.7 mL/min (A) (by C-G formula based on SCr of 1.26 mg/dL (H)).  Assessment: 84yo female on chronic coumadin for h/o afib.  INR remains therapeutic,  No bleeding noted.  Goal of Therapy:  INR 2-3 Monitor platelets by anticoagulation protocol: Yes   Plan:  Coumadin 2.5 mg today x 1 INR daily Monitor for signs and symptoms of bleeding.   Margot Ables, PharmD Clinical Pharmacist 05/07/2017 10:14 AM

## 2017-05-07 NOTE — Care Management Note (Signed)
Case Management Note  Patient Details  Name: [name] [name] MRN: [id number] Date of Birth: August 07, 1931  Subjective/Objective:       Admitted with COPD. From home, ind with ADL's. has insurance with drug coverage, PCP and supportive family. Pt in denial about COPD diagnosis, family says it will take some time for her to accept dx. Pt ordered [facility] at DC. Pt agreeable and has chosen [facility] from provider options. Pt aware HH has 48 hrs to make first visit.  Family at bedside for DC planning.             Action/Plan: Vaughan Basta, [facility] rep, aware of referral and will pull pt info from chart.   Expected Discharge Date:  05/07/17               Expected Discharge Plan:  [place]  In-House Referral:  NA  Discharge planning Services  CM Consult  Post Acute Care Choice:  Home Health Choice offered to:  Patient  HH Arranged:  RN [facility] Agency:  [facility]  Status of Service:  Completed, signed off  If discussed at [place] of Stay Meetings, dates discussed:    Additional Comments:  Sherald Barge, RN 05/07/2017, 10:15 AM

## 2017-05-08 ENCOUNTER — Other Ambulatory Visit: Payer: Self-pay | Admitting: Internal Medicine

## 2017-05-10 ENCOUNTER — Ambulatory Visit (INDEPENDENT_AMBULATORY_CARE_PROVIDER_SITE_OTHER): Payer: Medicare Other | Admitting: *Deleted

## 2017-05-10 DIAGNOSIS — I4891 Unspecified atrial fibrillation: Secondary | ICD-10-CM

## 2017-05-10 DIAGNOSIS — Z5181 Encounter for therapeutic drug level monitoring: Secondary | ICD-10-CM | POA: Diagnosis not present

## 2017-05-10 LAB — POCT INR: INR: 3.6

## 2017-05-12 NOTE — Discharge Summary (Signed)
Physician Discharge Summary  Patient ID: [name] [name] MRN: [id number] DOB/AGE: January 11, 1932 84 y.o. Primary Care Physician:Sariyah Corcino, Percell Miller, MD Admit date: 05/04/2017 Discharge date: 05/07/2017       Discharge Diagnoses:    Active Problems:   Essential hypertension, benign   Coronary atherosclerosis of native coronary artery   Automatic implantable cardioverter-defibrillator in situ   Atrial fibrillation (HCC)   Chronic diastolic heart failure (HCC)   COPD exacerbation (HCC)   Chronic obstructive asthma with acute exacerbation (HCC)   Acute respiratory failure with hypoxia (HCC)   Chronic kidney disease, stage III (moderate) (HCC)         Allergies as of 05/07/2017       Reactions    Codeine Nausea And Vomiting    Keflex [cephalexin] Itching, Rash                Medication List      TAKE these medications   acetaminophen 500 MG tablet Commonly known as:  TYLENOL Take 500 mg by mouth daily.    albuterol 108 (90 Base) MCG/ACT inhaler Commonly known as:  PROVENTIL HFA;VENTOLIN HFA Inhale 2 puffs into the lungs every 6 (six) hours as needed for wheezing or shortness of breath.    amLODipine 5 MG tablet Commonly known as:  NORVASC TAKE (1) TABLET BY MOUTH ONCE DAILY.    atorvastatin 20 MG tablet Commonly known as:  LIPITOR TAKE ONE TABLET BY MOUTH ONCE DAILY.    CALCIUM 600 + D PO Take 1 tablet by mouth 2 (two) times daily.    dofetilide 125 MCG capsule Commonly known as:  TIKOSYN TAKE (1) CAPSULE BY MOUTH TWICE DAILY. What changed:  See the new instructions.    doxycycline 100 MG tablet Commonly known as:  VIBRA-TABS Take 1 tablet (100 mg total) by mouth 2 (two) times daily. What changed:  additional instructions    folic acid 1 MG tablet Commonly known as:  FOLVITE Take 1 mg by mouth 2 (two) times daily.    furosemide 40 MG tablet Commonly known as:  LASIX Take 40-80 mg by mouth daily. Take one tablet by mouth every morning.  May take additional tablet as  needed if wheezing due to fluid.    losartan 100 MG tablet Commonly known as:  COZAAR TAKE ONE TABLET BY MOUTH DAILY.    metoprolol succinate 100 MG 24 hr tablet Commonly known as:  TOPROL-XL TAKE ONE TABLET BY MOUTH EVERY MORNING. TAKE WITH OR IMMEDIATELY FOLLOWING A MEAL.    potassium chloride SA 20 MEQ tablet Commonly known as:  K-DUR,KLOR-CON Take 20 mEq by mouth 3 (three) times daily.    predniSONE 10 MG tablet Commonly known as:  DELTASONE 4 daily for 3 days, 3 daily for 3 days, 2 daily for 3 days, 1 daily for 3 days What changed:    how much to take  how to take this  when to take this  additional instructions    SYSTANE OP Place 1 drop into both eyes daily as needed (Dry eyes).    Vitamin D 2000 units Caps Take 1 capsule by mouth at bedtime.    warfarin 2.5 MG tablet Commonly known as:  COUMADIN Take as directed. If you are unsure how to take this medication, talk to your nurse or doctor. Original instructions:  Take 1-1.5 tablets (2.5-3.75 mg total) by mouth as directed. Take 1 1/2 tablets daily except 1 tablet on Mondays and Thursdays. What changed:    how much to take  how to take this  when to take this  additional instructions           Discharged Condition: Improved       Consults: None   Significant Diagnostic Studies:  Imaging Results  Dg Chest 2 View   Result Date: 05/04/2017 CLINICAL DATA:  Dyspnea EXAM: CHEST  2 VIEW COMPARISON:  11/06/2015 chest radiograph. FINDINGS: Stable configuration of 3 lead left subclavian pacemaker. Intact sternotomy wires. CABG clips overlie the mediastinum. Stable cardiomediastinal silhouette with normal heart size. No pneumothorax. No pleural effusion. Lungs appear clear, with no acute consolidative airspace disease and no pulmonary edema. IMPRESSION: No active cardiopulmonary disease. Electronically Signed   By: Ilona Sorrel M.D.   On: 05/04/2017 02:11    Ct Head Wo Contrast   Result Date:  05/01/2017 CLINICAL DATA:  Intractable headaches. EXAM: CT HEAD WITHOUT CONTRAST TECHNIQUE: Contiguous axial images were obtained from the base of the skull through the vertex without intravenous contrast. COMPARISON:  September 20, 2015 FINDINGS: Brain: No evidence of acute infarction, hemorrhage, hydrocephalus, extra-axial collection or mass lesion/mass effect. There is chronic diffuse atrophy. Vascular: No hyperdense vessel or unexpected calcification. Skull: Normal. Negative for fracture or focal lesion. Sinuses/Orbits: No acute finding. Other: None. IMPRESSION: No focal acute intracranial abnormality is identified. Electronically Signed   By: Abelardo Diesel M.D.   On: 05/01/2017 19:30       Lab Results: Basic Metabolic Panel: Recent Labs (last 2 labs)      Recent Labs    05/05/17 0528  NA 142  K 4.7  CL 105  CO2 24  GLUCOSE 164*  BUN 46*  CREATININE 1.26*  CALCIUM 9.1      Liver Function Tests: Recent Labs (last 2 labs)      Recent Labs    05/05/17 0528  AST 28  ALT 24  ALKPHOS 36*  BILITOT 0.8  PROT 7.1  ALBUMIN 4.0          CBC: Recent Labs (last 2 labs)      Recent Labs    05/05/17 0528  WBC 15.6*  HGB 14.9  HCT 46.0  MCV 96.6  PLT 169        No results found for this or any previous visit (from the past 240 hour(s)).    Hospital Course: This is an 84 year old who became increasingly short of breath despite having taken an antibiotic and steroids.  She eventually presented to the emergency department and was found to have what seemed to be a severe COPD exacerbation.  Chest x-ray did not show any infiltrate.  She was started on IV steroids given bronchodilators and oxygen and improved.  By the time of discharge she was approaching baseline although she was still more short of breath than her baseline.  She feels like she is going to be able to manage at home.  She will have home health services.  She may need oxygen at home and she is being tested for that now.    Discharge Exam: Blood pressure (!) 151/80, pulse 71, temperature (!) 97.5 F (36.4 C), temperature source Oral, resp. rate 18, height 5' (1.524 m), weight 75.3 kg (166 lb), SpO2 96 %. She is awake and alert.  Her chest is clear.  Her heart is irregular as always.  She does not have any edema.   Disposition: Home with home health services       Discharge Instructions     Face-to-face encounter (required for Medicare/Medicaid patients)  Complete by:  As directed      I Evangeline Utley L certify that this patient is under my care and that I, or a nurse practitioner or physician's assistant working with me, had a face-to-face encounter that meets the physician face-to-face encounter requirements with this patient on 05/07/2017. The encounter with the patient was in whole, or in part for the following medical condition(s) which is the primary reason for home health care (List medical condition): COPD exacerbation    The encounter with the patient was in whole, or in part, for the following medical condition, which is the primary reason for home health care:  COPD exacerbation    I certify that, based on my findings, the following services are medically necessary home health services:  Nursing    Reason for Medically Necessary Home Health Services:  Skilled Nursing- Change/Decline in Patient Status    My clinical findings support the need for the above services:  Shortness of breath with activity    Further, I certify that my clinical findings support that this patient is homebound due to:  Shortness of Breath with activity    Home Health   Complete by:  As directed      To provide the following care/treatments:  RN    Please check PT/INR on 05/10/2017 and report to the Coumadin clinic at [facility] in [place] than 30 minutes were spent on this discharge activity   Signed: Carmello Cabiness L     05/07/2017, 8:31 AM        Electronically signed by  Sinda Du, MD at 05/07/2017  8:33 AM Electronically signed by Sinda Du, MD at 05/07/2017  8:43 AM    ED to Hosp-Admission (Discharged) on 05/04/2017       Revision History       Detailed Report

## 2017-05-13 ENCOUNTER — Ambulatory Visit (INDEPENDENT_AMBULATORY_CARE_PROVIDER_SITE_OTHER): Payer: Medicare Other | Admitting: *Deleted

## 2017-05-13 DIAGNOSIS — I4891 Unspecified atrial fibrillation: Secondary | ICD-10-CM | POA: Diagnosis not present

## 2017-05-13 DIAGNOSIS — Z5181 Encounter for therapeutic drug level monitoring: Secondary | ICD-10-CM

## 2017-05-13 LAB — POCT INR: INR: 2.2

## 2017-05-13 NOTE — Patient Instructions (Signed)
Take coumadin 2.5mg  daily except 3.75mg  on Mondays and Thursdays and recheck on 05/19/17. On prednisone taper and doxycycline. Will finish prednisone 1/30 and doxy on 1/28 Order given to [facility]

## 2017-05-19 ENCOUNTER — Telehealth: Payer: Self-pay | Admitting: *Deleted

## 2017-05-19 ENCOUNTER — Ambulatory Visit (INDEPENDENT_AMBULATORY_CARE_PROVIDER_SITE_OTHER): Payer: Medicare Other | Admitting: *Deleted

## 2017-05-19 DIAGNOSIS — Z5181 Encounter for therapeutic drug level monitoring: Secondary | ICD-10-CM

## 2017-05-19 DIAGNOSIS — I4891 Unspecified atrial fibrillation: Secondary | ICD-10-CM

## 2017-05-19 LAB — POCT INR: INR: 2.2

## 2017-05-19 NOTE — Telephone Encounter (Signed)
Done.  See coumadin note. 

## 2017-05-19 NOTE — Patient Instructions (Signed)
Restart coumadin 1 1/2 tablets daily except 1 tablet on Mondays, Wednesdays and Fridays Recheck INR 05/26/17. Order given to [facility]

## 2017-05-19 NOTE — Telephone Encounter (Signed)
INR 2.2 per Tammy w/ [facility] [phone]

## 2017-05-26 ENCOUNTER — Telehealth: Payer: Self-pay | Admitting: *Deleted

## 2017-05-26 ENCOUNTER — Ambulatory Visit (INDEPENDENT_AMBULATORY_CARE_PROVIDER_SITE_OTHER): Payer: Medicare Other | Admitting: *Deleted

## 2017-05-26 DIAGNOSIS — I4891 Unspecified atrial fibrillation: Secondary | ICD-10-CM | POA: Diagnosis not present

## 2017-05-26 DIAGNOSIS — Z5181 Encounter for therapeutic drug level monitoring: Secondary | ICD-10-CM

## 2017-05-26 LAB — POCT INR: INR: 2.7

## 2017-05-26 NOTE — Telephone Encounter (Signed)
INR 2.7 per [facility] [phone]

## 2017-05-26 NOTE — Patient Instructions (Signed)
Continue coumadin 1 1/2 tablets daily except 1 tablet on Mondays, Wednesdays and Fridays Recheck INR 06/07/17. Order given to [facility]

## 2017-05-26 NOTE — Telephone Encounter (Signed)
Done.  See coumadin note. 

## 2017-06-04 ENCOUNTER — Telehealth: Payer: Self-pay | Admitting: Cardiology

## 2017-06-04 NOTE — Telephone Encounter (Signed)
Spoke with patient who reports a history of ShOB with hospitalization in January for asthma and COPD. She is reporting persistent ShOB that she felt never fully improved from her hospitalization and is requesting to send a remote transmission. I walked patient though how to send a remote transmission. Upon review of her transmission her thoracic impedence is notable for fluid accumulation coinciding with patient reporting that she has noted a 10lb weight gain over the past couple of days. Patient has a history of afib however presents today atrially and ventricularly paced. Patient is audibly short of breath during conversation and activity. I encouraged patient to take a second lasix pill as ordered for such symptoms and will forward this information for additional recommendations. Patient is agreeable to this plan, requesting a call back today if possible.

## 2017-06-04 NOTE — Telephone Encounter (Signed)
Patient calling   1. Has your device fired? no  2. Is you device beeping? no  3. Are you experiencing draining or swelling at device site?no = 4. Are you calling to see if we received your device transmission?  no  5. Have you passed out? No  Patient needs some assistance sending transmission. Patient states that ICD was not checked in the hospital.    Please route to [place]

## 2017-06-04 NOTE — Telephone Encounter (Signed)
Patient made apt with Melina Copa PA-C for 06/17/17 at 3 pm.  She will call Monday 06/07/17 with weight.Weight today 176 lbs

## 2017-06-07 ENCOUNTER — Ambulatory Visit (INDEPENDENT_AMBULATORY_CARE_PROVIDER_SITE_OTHER): Payer: Medicare Other | Admitting: *Deleted

## 2017-06-07 DIAGNOSIS — I4891 Unspecified atrial fibrillation: Secondary | ICD-10-CM

## 2017-06-07 DIAGNOSIS — Z5181 Encounter for therapeutic drug level monitoring: Secondary | ICD-10-CM | POA: Diagnosis not present

## 2017-06-07 DIAGNOSIS — I480 Paroxysmal atrial fibrillation: Secondary | ICD-10-CM

## 2017-06-07 LAB — POCT INR: INR: 3

## 2017-06-07 NOTE — Patient Instructions (Signed)
Continue coumadin 1 1/2 tablets daily except 1 tablet on Mondays, Wednesdays and Fridays Recheck on 06/23/17

## 2017-06-07 NOTE — Telephone Encounter (Signed)
Wt today 174 lbs

## 2017-06-09 ENCOUNTER — Other Ambulatory Visit: Payer: Self-pay | Admitting: Cardiology

## 2017-06-09 DIAGNOSIS — I5032 Chronic diastolic (congestive) heart failure: Secondary | ICD-10-CM

## 2017-06-09 MED ORDER — FUROSEMIDE 40 MG PO TABS: 40.0000 mg | ORAL_TABLET | Freq: Every day | ORAL | 11 refills | Status: DC

## 2017-06-17 ENCOUNTER — Ambulatory Visit: Payer: Medicare Other | Admitting: Physician Assistant

## 2017-06-23 ENCOUNTER — Ambulatory Visit (INDEPENDENT_AMBULATORY_CARE_PROVIDER_SITE_OTHER): Payer: Medicare Other | Admitting: *Deleted

## 2017-06-23 DIAGNOSIS — I480 Paroxysmal atrial fibrillation: Secondary | ICD-10-CM

## 2017-06-23 DIAGNOSIS — I4891 Unspecified atrial fibrillation: Secondary | ICD-10-CM

## 2017-06-23 DIAGNOSIS — Z5181 Encounter for therapeutic drug level monitoring: Secondary | ICD-10-CM | POA: Diagnosis not present

## 2017-06-23 LAB — POCT INR: INR: 1.8

## 2017-06-23 NOTE — Patient Instructions (Signed)
Take coumadin 2 tablets tonight then resume 1 1/2 tablets daily except 1 tablet on Mondays, Wednesdays and Fridays Recheck on 07/14/17

## 2017-06-30 ENCOUNTER — Ambulatory Visit: Payer: Medicare Other | Admitting: Cardiology

## 2017-07-12 ENCOUNTER — Ambulatory Visit (HOSPITAL_COMMUNITY)
Admission: RE | Admit: 2017-07-12 | Discharge: 2017-07-12 | Disposition: A | Discharge status: Home / Self Care | Payer: Medicare Other | Source: Ambulatory Visit | Attending: Pulmonary Disease | Admitting: Pulmonary Disease

## 2017-07-12 ENCOUNTER — Other Ambulatory Visit (HOSPITAL_COMMUNITY): Payer: Self-pay | Admitting: Pulmonary Disease

## 2017-07-12 ENCOUNTER — Other Ambulatory Visit: Payer: Self-pay | Admitting: Pulmonary Disease

## 2017-07-12 DIAGNOSIS — M79604 Pain in right leg: Secondary | ICD-10-CM | POA: Diagnosis present

## 2017-07-19 ENCOUNTER — Ambulatory Visit (INDEPENDENT_AMBULATORY_CARE_PROVIDER_SITE_OTHER): Payer: Medicare Other | Admitting: *Deleted

## 2017-07-19 DIAGNOSIS — I4891 Unspecified atrial fibrillation: Secondary | ICD-10-CM | POA: Diagnosis not present

## 2017-07-19 DIAGNOSIS — I472 Ventricular tachycardia: Secondary | ICD-10-CM | POA: Diagnosis not present

## 2017-07-19 DIAGNOSIS — I480 Paroxysmal atrial fibrillation: Secondary | ICD-10-CM | POA: Diagnosis not present

## 2017-07-19 DIAGNOSIS — I4729 Other ventricular tachycardia: Secondary | ICD-10-CM

## 2017-07-19 DIAGNOSIS — Z5181 Encounter for therapeutic drug level monitoring: Secondary | ICD-10-CM | POA: Diagnosis not present

## 2017-07-19 DIAGNOSIS — I5032 Chronic diastolic (congestive) heart failure: Secondary | ICD-10-CM

## 2017-07-19 LAB — POCT INR: INR: 2.4

## 2017-07-19 NOTE — Progress Notes (Signed)
Cardiology Office Note  Date: 07/20/2017   ID: [name] [name], [name] [name] [birth year], MRN [id number]  PCP: Sinda Du, MD  Primary Cardiologist: Rozann Lesches, MD   Chief Complaint  Patient presents with  . PAF    History of Present Illness: [name] [name] is an 84 y.o. female last seen in November 2018.  She presents today for a follow-up visit. Chart review finds that she call the office back in February reporting shortness of breath and a 10 pound weight gain although was in sinus rhythm at that time based on device interrogation.  She states that she has improved since that time, did have a recent episode of right leg swelling and cellulitis that was treated by Dr. Luan Pulling.  Treatment included antibiotics and also a temporary intensification of her Lasix with which I agree.  Her weight is up relative to January but she feels like she is doing better.  She admits to me that she has been very hesitant to use extra Lasix when her weight goes up, although we have discussed this.  She continues on Coumadin with follow-up in the anticoagulation clinic.  Most recent INR was 2.4.  She denies any bleeding problems.  She follows in the device clinic with Dr. Lovena Le, Medtronic ICD in place.  Device interrogation from January demonstrated 5.6% AT/AF and 2 episodes of NSVT.  Past Medical History:  Diagnosis Date  . Atrial fibrillation ([place])    a. anticoagulated with Warfarin  . CHF (congestive heart failure) ([place])   . Chronic diastolic heart failure ([place])   . Complete heart block ([place])   . Coronary atherosclerosis of native coronary artery    Multivessel status post CABG, DES PLA March 2006  . Essential hypertension, benign   . Mixed hyperlipidemia   . Osteoarthritis   . Ventricular fibrillation ([place]) 2003   a. seen on PPM interrogation a/w syncope    Past Surgical History:  Procedure Laterality Date  . CARDIAC DEFIBRILLATOR PLACEMENT     MDT dual chamber ICD  . CARDIOVERSION N/A  08/09/2014   Procedure: CARDIOVERSION;  Surgeon: Sanda Klein, MD;  Location: [facility] ENDOSCOPY;  Service: Cardiovascular;  Laterality: N/A;  . CORONARY ARTERY BYPASS GRAFT     LIMA to LAD, SVG to diagonal, SVG to ramus and OM  . IMPLANTABLE CARDIOVERTER DEFIBRILLATOR GENERATOR CHANGE N/A 09/25/2013   Procedure: IMPLANTABLE CARDIOVERTER DEFIBRILLATOR GENERATOR CHANGE;  Surgeon: Evans Lance, MD;  Location: [facility] CATH LAB;  Service: Cardiovascular;  Laterality: N/A;  . LEAD REVISION N/A 09/29/2013   Procedure: LEAD REVISION;  Surgeon: Deboraha Sprang, MD;  Location: [facility] CATH LAB;  Service: Cardiovascular;  Laterality: N/A;  . TONSILLECTOMY    . YAG LASER APPLICATION Left 12/24/930   Procedure: YAG LASER APPLICATION;  Surgeon: Elta Guadeloupe T. Gershon Crane, MD;  Location: [facility] ORS;  Service: Ophthalmology;  Laterality: Left;  . YAG LASER APPLICATION Right 6/71/2458   Procedure: YAG LASER APPLICATION;  Surgeon: Elta Guadeloupe T. Gershon Crane, MD;  Location: [facility] ORS;  Service: Ophthalmology;  Laterality: Right;    Current Outpatient Medications  Medication Sig Dispense Refill  . acetaminophen (TYLENOL) 500 MG tablet Take 500 mg by mouth daily.     [name] albuterol (PROVENTIL HFA;VENTOLIN HFA) 108 (90 BASE) MCG/ACT inhaler Inhale 2 puffs into the lungs every 6 (six) hours as needed for wheezing or shortness of breath. 1 Inhaler 2  . amLODipine (NORVASC) 5 MG tablet TAKE (1) TABLET BY MOUTH ONCE DAILY. 90 tablet 3  . atorvastatin (LIPITOR)  20 MG tablet TAKE ONE TABLET BY MOUTH ONCE DAILY. 90 tablet 3  . Calcium Carbonate-Vitamin D (CALCIUM 600 + D PO) Take 1 tablet by mouth 2 (two) times daily.    . Cholecalciferol (VITAMIN D) 2000 UNITS CAPS Take 1 capsule by mouth at bedtime.     . dofetilide (TIKOSYN) 125 MCG capsule TAKE (1) CAPSULE BY MOUTH TWICE DAILY. 180 capsule 1  . doxycycline (VIBRA-TABS) 100 MG tablet Take 1 tablet (100 mg total) by mouth 2 (two) times daily. 14 tablet 0  . folic acid (FOLVITE) 1 MG tablet Take 1 mg by mouth 2 (two)  times daily.     . furosemide (LASIX) 40 MG tablet Take 1-2 tablets (40-80 mg total) by mouth daily. Take one tablet by mouth every morning.  May take additional tablet as needed if wheezing due to fluid. 30 tablet 11  . losartan (COZAAR) 100 MG tablet TAKE ONE TABLET BY MOUTH DAILY. 90 tablet 3  . metoprolol succinate (TOPROL-XL) 100 MG 24 hr tablet TAKE ONE TABLET BY MOUTH EVERY MORNING. TAKE WITH OR IMMEDIATELY FOLLOWING A MEAL. 90 tablet 3  . Polyethyl Glycol-Propyl Glycol (SYSTANE OP) Place 1 drop into both eyes daily as needed (Dry eyes).    . potassium chloride SA (K-DUR,KLOR-CON) 20 MEQ tablet Take 20 mEq by mouth 3 (three) times daily.    [name] warfarin (COUMADIN) 2.5 MG tablet Take 1-1.5 tablets (2.5-3.75 mg total) by mouth as directed. Take 1 1/2 tablets daily except 1 tablet on Mondays and Thursdays. 60 tablet 5   No current facility-administered medications for this visit.    Allergies:  Codeine and Keflex [cephalexin]   Social History: The patient  reports that she has never smoked. She has never used smokeless tobacco. She reports that she does not drink alcohol or use drugs.   ROS:  Please see the history of present illness. Otherwise, complete review of systems is positive for none.  All other systems are reviewed and negative.   Physical Exam: VS:  BP (!) 149/64   Pulse 63   Ht 5' (1.524 m)   Wt 173 lb (78.5 kg)   SpO2 97%   BMI 33.79 kg/m , BMI Body mass index is 33.79 kg/m.  Wt Readings from Last 3 Encounters:  07/20/17 173 lb (78.5 kg)  05/04/17 166 lb (75.3 kg)  03/03/17 172 lb (78 kg)    General: Elderly woman, using a cane today. HEENT: Conjunctiva and lids normal, oropharynx clear. Neck: Supple, no elevated JVP or carotid bruits, no thyromegaly. Lungs: Clear to auscultation, nonlabored breathing at rest. Cardiac: Regular rate and rhythm, no S3, 2/6 systolic murmur. Abdomen: Soft, nontender, bowel sounds present. Extremities: Mild lower leg edema with  resolving erythema and skin dryness, distal pulses 2+. Skin: Warm and dry. Musculoskeletal: No kyphosis. Neuropsychiatric: Alert and oriented x3, affect grossly appropriate.  ECG: I personally reviewed the tracing from 05/04/2017 which showed dual-chamber pacing.  Recent Labwork: 08/24/2016: Magnesium 2.0 05/04/2017: B Natriuretic Peptide 254.0 05/05/2017: ALT 24; AST 28; BUN 46; Creatinine, Ser 1.26; Hemoglobin 14.9; Platelets 169; Potassium 4.7; Sodium 142   Other Studies Reviewed Today:  Lexiscan Cardiolite 06/15/2014: FINDINGS: Pharmacological stress  Baseline EKG shows ventricular pacing. After injection heart rate increased from 61 beats per min up to 75 beats per min, and blood pressure decreased from 139/76 and 113/66. The test was stopped after injection was complete, the patient did not experience any chest pain. Post-injection EKG showed no significant arrhythmias, findings not interpretable  for ischemia because she was ventricular paced throughout the study.  Perfusion: No decreased activity in the left ventricle on stress imaging to suggest reversible ischemia or infarction.  Wall Motion: Normal left ventricular wall motion. No left ventricular dilation.  Left Ventricular Ejection Fraction: 77 %  End diastolic volume 55 ml  End systolic volume 12 ml  IMPRESSION: 1. No reversible ischemia or infarction.  2. Normal left ventricular wall motion.  3. Left ventricular ejection fraction 77%  4. Low-risk stress test findings*.  Echocardiogram 09/13/2013: Study Conclusions  - Left ventricle: The cavity size was normal. Wall thickness was increased in a pattern of mild LVH. Systolic function was normal. The estimated ejection fraction was in the range of 60% to 65%. Wall motion was normal; there were no regional wall motion abnormalities. Features are consistent with a pseudonormal left ventricular filling pattern, with concomitant abnormal  relaxation and increased filling pressure (grade 2 diastolic dysfunction). Doppler parameters are consistent with elevated ventricular end-diastolic filling pressure. - Aortic valve: Mildly calcified annulus. Trileaflet; mildly calcified leaflets. There was very mild stenosis. There was no significant regurgitation. Mean gradient (S): 9 mm Hg. Valve area (VTI): 1.52 cm^2. Valve area (Vmax): 1.71 cm^2. - Mitral valve: Calcified annulus. Mildly thickened leaflets . There was mild regurgitation. - Left atrium: The atrium was mildly to moderately dilated. - Right ventricle: The cavity size was mildly dilated. Pacer wire or catheter noted in right ventricle. - Right atrium: Central venous pressure (est): 3 mm Hg. - Atrial septum: No defect or patent foramen ovale was identified. - Tricuspid valve: There was mild regurgitation. - Pulmonary arteries: PA peak pressure: 42 mm Hg (S). - Pericardium, extracardiac: There was no pericardial effusion.  Impressions:  - Mild LVH with LVEF 24-23%, grade 2 diastolic dysfunction with increased filling pressures. MIld to moderate left atrial enlargement. Mild mitral regurgitation. Very mildly stenotic aortic valve. Device wire noted in right heart. Mild tricuspid regurgitation with PASP 42 mmHg.  Assessment and Plan:  1.  Paroxysmal atrial fibrillation.  She had only 5% AF/AT burden by last device interrogation.  She continues on Tikosyn, Toprol-XL, and Coumadin.  2.  Recent weight gain and leg swelling, exacerbation of diastolic heart failure.  She has had clinical improvement after recent up titration of Lasix by Dr. Luan Pulling.  I talked with her today about being very careful to follow daily weights and use additional Lasix if she gains 2 pounds in 24 hours or 5 pounds in a week.  She has a general been somewhat hesitant to do this.  Also discussed low-salt diet.  Will check a follow-up BMET for her next visit.  3.  CAD with  history of CABG and DES to the PLA.  She reports no active angina.  4.  Medtronic ICD in place.  She continues to follow with Dr. Lovena Le.  Current medicines were reviewed with the patient today.   Orders Placed This Encounter  Procedures  . Basic Metabolic Panel (BMET)    Disposition: Follow-up in 3 months.  Signed, Satira Sark, MD, [facility] 07/20/2017 1:52 PM    [facility] at [facility] [address] [place], [place] [address] Phone: [phone]; Fax: [phone]

## 2017-07-19 NOTE — Patient Instructions (Signed)
Continue coumadin 1 1/2 tablets daily except 1 tablet on Mondays, Wednesdays and Fridays Recheck in 4 weeks 

## 2017-07-19 NOTE — Progress Notes (Signed)
Remote ICD transmission.   

## 2017-07-20 ENCOUNTER — Encounter: Payer: Self-pay | Admitting: Cardiology

## 2017-07-20 ENCOUNTER — Ambulatory Visit: Payer: Medicare Other | Admitting: Cardiology

## 2017-07-20 VITALS — BP 149/64 | HR 63 | Ht 60.0 in | Wt 173.0 lb

## 2017-07-20 DIAGNOSIS — I480 Paroxysmal atrial fibrillation: Secondary | ICD-10-CM | POA: Diagnosis not present

## 2017-07-20 DIAGNOSIS — I25119 Atherosclerotic heart disease of native coronary artery with unspecified angina pectoris: Secondary | ICD-10-CM

## 2017-07-20 DIAGNOSIS — I5032 Chronic diastolic (congestive) heart failure: Secondary | ICD-10-CM

## 2017-07-20 DIAGNOSIS — Z95810 Presence of automatic (implantable) cardiac defibrillator: Secondary | ICD-10-CM

## 2017-07-20 NOTE — Patient Instructions (Addendum)
Your physician wants you to follow-up in:3 months with Dr.McDowell      Get Lab work BMET JUST BEFORE THAT VISIT      Your physician recommends that you continue on your current medications as directed. Please refer to the Current Medication list given to you today.    You may take an extra 20 mg Lasix if you gain more than 3 labs in one day of 5 lbs in one week       Thank you for choosing [facility] !

## 2017-07-23 LAB — CUP PACEART REMOTE DEVICE CHECK
Battery Remaining Longevity: 37 mo
Brady Statistic AP VP Percent: 91.65 %
Brady Statistic AS VS Percent: 0.01 %
Brady Statistic RA Percent Paced: 91.1 %
Brady Statistic RV Percent Paced: 99.84 %
HighPow Impedance: 53 Ohm
HighPow Impedance: 67 Ohm
Implantable Lead Location: 753860
Implantable Lead Model: 157
Implantable Lead Model: 4086
Implantable Lead Serial Number: 108215
Implantable Pulse Generator Implant Date: 20150608
Lead Channel Impedance Value: 304 Ohm
Lead Channel Impedance Value: 418 Ohm
Lead Channel Pacing Threshold Amplitude: 1 V
Lead Channel Pacing Threshold Pulse Width: 0.4 ms
Lead Channel Pacing Threshold Pulse Width: 0.4 ms
Lead Channel Sensing Intrinsic Amplitude: 10 mV
Lead Channel Sensing Intrinsic Amplitude: 10 mV
Lead Channel Sensing Intrinsic Amplitude: 2.625 mV
Lead Channel Setting Pacing Amplitude: 2 V
Lead Channel Setting Pacing Pulse Width: 0.4 ms
Lead Channel Setting Sensing Sensitivity: 0.3 mV
MDC IDC LEAD IMPLANT DT: 20030823
MDC IDC LEAD IMPLANT DT: 20030823
MDC IDC LEAD LOCATION: 753859
MDC IDC LEAD SERIAL: 112190
MDC IDC MSMT BATTERY VOLTAGE: 2.96 V
MDC IDC MSMT LEADCHNL RA SENSING INTR AMPL: 2.625 mV
MDC IDC MSMT LEADCHNL RV IMPEDANCE VALUE: 304 Ohm
MDC IDC MSMT LEADCHNL RV PACING THRESHOLD AMPLITUDE: 1.25 V
MDC IDC SESS DTM: 20190401083825
MDC IDC SET LEADCHNL RV PACING AMPLITUDE: 3.25 V
MDC IDC STAT BRADY AP VS PERCENT: 0.01 %
MDC IDC STAT BRADY AS VP PERCENT: 8.33 %

## 2017-08-05 ENCOUNTER — Ambulatory Visit (HOSPITAL_COMMUNITY)
Admission: RE | Admit: 2017-08-05 | Discharge: 2017-08-05 | Disposition: A | Discharge status: Home / Self Care | Payer: Medicare Other | Source: Ambulatory Visit | Attending: Internal Medicine | Admitting: Internal Medicine

## 2017-08-05 ENCOUNTER — Other Ambulatory Visit (HOSPITAL_COMMUNITY): Payer: Self-pay | Admitting: Internal Medicine

## 2017-08-05 DIAGNOSIS — M1711 Unilateral primary osteoarthritis, right knee: Secondary | ICD-10-CM | POA: Insufficient documentation

## 2017-08-05 DIAGNOSIS — R52 Pain, unspecified: Secondary | ICD-10-CM

## 2017-08-05 DIAGNOSIS — M11261 Other chondrocalcinosis, right knee: Secondary | ICD-10-CM | POA: Diagnosis not present

## 2017-08-05 DIAGNOSIS — M25561 Pain in right knee: Secondary | ICD-10-CM | POA: Diagnosis not present

## 2017-08-12 ENCOUNTER — Encounter: Payer: Self-pay | Admitting: Internal Medicine

## 2017-08-12 ENCOUNTER — Ambulatory Visit: Payer: Medicare Other | Admitting: Internal Medicine

## 2017-08-12 VITALS — BP 146/70 | HR 74 | Ht 60.0 in | Wt 175.0 lb

## 2017-08-12 DIAGNOSIS — Z79899 Other long term (current) drug therapy: Secondary | ICD-10-CM | POA: Diagnosis not present

## 2017-08-12 MED ORDER — FUROSEMIDE 80 MG PO TABS: 80.0000 mg | ORAL_TABLET | Freq: Every day | ORAL | 3 refills | Status: DC

## 2017-08-12 NOTE — Progress Notes (Signed)
HPI [name] [name] returns today for followup. She is a pleasant 84 yo woman with a h/o VF, PAF, chronic diastolic heart failure, CHB, s/p ICD implant who underwent ICD generator change a couple of years ago.  She has done well in the interim, and for the most part maintained NSR. She admits to dietary indiscretion but denies medical non-compliance. She has class 2B heart failure symptoms. She has had some worsening sob. Minimal peripheral edema.     Allergies    Allergies  Allergen Reactions  . Codeine Nausea And Vomiting  . Keflex [Cephalexin] Itching and Rash     Current Outpatient Medications  Medication Sig Dispense Refill  . acetaminophen (TYLENOL) 500 MG tablet Take 500 mg by mouth daily.     [name] albuterol (PROVENTIL HFA;VENTOLIN HFA) 108 (90 BASE) MCG/ACT inhaler Inhale 2 puffs into the lungs every 6 (six) hours as needed for wheezing or shortness of breath. 1 Inhaler 2  . amLODipine (NORVASC) 5 MG tablet TAKE (1) TABLET BY MOUTH ONCE DAILY. 90 tablet 3  . atorvastatin (LIPITOR) 20 MG tablet TAKE ONE TABLET BY MOUTH ONCE DAILY. 90 tablet 3  . Calcium Carbonate-Vitamin D (CALCIUM 600 + D PO) Take 1 tablet by mouth 2 (two) times daily.    . Cholecalciferol (VITAMIN D) 2000 UNITS CAPS Take 1 capsule by mouth at bedtime.     . dofetilide (TIKOSYN) 125 MCG capsule TAKE (1) CAPSULE BY MOUTH TWICE DAILY. 841 capsule 1  . folic acid (FOLVITE) 1 MG tablet Take 1 mg by mouth 2 (two) times daily.     . furosemide (LASIX) 40 MG tablet Take 1-2 tablets (40-80 mg total) by mouth daily. Take one tablet by mouth every morning.  May take additional tablet as needed if wheezing due to fluid. (Patient taking differently: Take 40 mg by mouth daily. May take extra 20mg  tablet as needed) 30 tablet 11  . losartan (COZAAR) 100 MG tablet TAKE ONE TABLET BY MOUTH DAILY. 90 tablet 3  . metoprolol succinate (TOPROL-XL) 100 MG 24 hr tablet TAKE ONE TABLET BY MOUTH EVERY MORNING. TAKE WITH OR IMMEDIATELY  FOLLOWING A MEAL. 90 tablet 3  . Polyethyl Glycol-Propyl Glycol (SYSTANE OP) Place 1 drop into both eyes daily as needed (Dry eyes).    . potassium chloride SA (K-DUR,KLOR-CON) 20 MEQ tablet Take 20 mEq by mouth 3 (three) times daily.    [name] warfarin (COUMADIN) 2.5 MG tablet Take 1-1.5 tablets (2.5-3.75 mg total) by mouth as directed. Take 1 1/2 tablets daily except 1 tablet on Mondays and Thursdays. (Patient taking differently: Take 2.5-3.75 mg by mouth as directed. Take 1 1/2 tablets daily except 1 tablet on Mondays, Wednesday and Friday.) 60 tablet 5   No current facility-administered medications for this visit.      Past Medical History:  Diagnosis Date  . Atrial fibrillation ([place])    a. anticoagulated with Warfarin  . CHF (congestive heart failure) ([place])   . Chronic diastolic heart failure ([place])   . Complete heart block ([place])   . Coronary atherosclerosis of native coronary artery    Multivessel status post CABG, DES PLA March 2006  . Essential hypertension, benign   . Mixed hyperlipidemia   . Osteoarthritis   . Ventricular fibrillation ([place]) 2003   a. seen on PPM interrogation a/w syncope    ROS:   All systems reviewed and negative except as noted in the HPI.   Past Surgical History:  Procedure Laterality Date  .  CARDIAC DEFIBRILLATOR PLACEMENT     MDT dual chamber ICD  . CARDIOVERSION N/A 08/09/2014   Procedure: CARDIOVERSION;  Surgeon: Sanda Klein, MD;  [facility];  Service: Cardiovascular;  Laterality: N/A;  . CORONARY ARTERY BYPASS GRAFT     LIMA to LAD, SVG to diagonal, SVG to ramus and OM  . IMPLANTABLE CARDIOVERTER DEFIBRILLATOR GENERATOR CHANGE N/A 09/25/2013   Procedure: IMPLANTABLE CARDIOVERTER DEFIBRILLATOR GENERATOR CHANGE;  Surgeon: Evans Lance, MD;  Location[facility] CATH LAB;  Service: Cardiovascular;  Laterality: N/A;  . LEAD REVISION N/A 09/29/2013   Procedure: LEAD REVISION;  Surgeon: Deboraha Sprang, MD;  Location: [facility] CATH LAB;  Service:  Cardiovascular;  Laterality: N/A;  . TONSILLECTOMY    . YAG LASER APPLICATION Left 04/25/1094   Procedure: YAG LASER APPLICATION;  Surgeon: Elta Guadeloupe T. Gershon Crane, MD;  Location: [facility] ORS;  Service: Ophthalmology;  Laterality: Left;  . YAG LASER APPLICATION Right 0/45/4098   Procedure: YAG LASER APPLICATION;  Surgeon: Elta Guadeloupe T. Gershon Crane, MD;  Location: [facility] ORS;  Service: Ophthalmology;  Laterality: Right;     Family History  Problem Relation Age of Onset  . Heart attack Father   . Heart attack Brother      Social History   Socioeconomic History  . Marital status: Widowed    Spouse name: Not on file  . Number of children: 2  . Years of education: Not on file  . Highest education level: Not on file  Occupational History  . Occupation: Retired    Comment: Surveyor, quantity: RETIRED  Social Needs  . Financial resource strain: Not on file  . Food insecurity:    Worry: Not on file    Inability: Not on file  . Transportation needs:    Medical: Not on file    Non-medical: Not on file  Tobacco Use  . Smoking status: Never Smoker  . Smokeless tobacco: Never Used  Substance and Sexual Activity  . Alcohol use: No    Alcohol/week: 0.0 oz  . Drug use: No  . Sexual activity: Never  Lifestyle  . Physical activity:    Days per week: Not on file    Minutes per session: Not on file  . Stress: Not on file  Relationships  . Social connections:    Talks on phone: Not on file    Gets together: Not on file    Attends religious service: Not on file    Active member of club or organization: Not on file    Attends meetings of clubs or organizations: Not on file    Relationship status: Not on file  . Intimate partner violence:    Fear of current or ex partner: Not on file    Emotionally abused: Not on file    Physically abused: Not on file    Forced sexual activity: Not on file  Other Topics Concern  . Not on file  Social History Narrative  . Not on file     BP (!) 146/70    Pulse 74   Ht 5' (1.524 m)   Wt 175 lb (79.4 kg)   BMI 34.18 kg/m   Physical Exam:  Well appearing elderly woman, NAD HEENT: Unremarkable Neck:  7 cm JVD, no thyromegally Lymphatics:  No adenopathy Back:  No CVA tenderness Lungs:  Clear with no wheezes HEART:  Regular rate rhythm, no murmurs, no rubs, no clicks Abd:  soft, positive bowel sounds, no organomegally, no rebound, no guarding Ext:  2 plus pulses, no edema, no cyanosis, no clubbing Skin:  No rashes no nodules Neuro:  CN II through XII intact, motor grossly intact  EKG - none  DEVICE  Normal device function.  See PaceArt for details.   Assess/Plan:  1. Chronic diastolic heart failure - Her optivol is increased and I suspect her dyspnea is due to worsening diastolic heart failure. I have asked her to increase her lasix to 80 mg daily. She will return in 2 weeks for a bmp. 2. ICD - her medtronic device is working normally. 3. PAF - she is on low dose dofetilide and doing well. 4. VT - she has had only NSVT. She will continue her current meds.  Mikle Bosworth.D.

## 2017-08-12 NOTE — Patient Instructions (Signed)
Medication Instructions:  Your physician has recommended you make the following change in your medication:  Increase Lasix to 80 mg Daily    Labwork: Your physician recommends that you return for lab work in: 50 -14 Days (08/23/17 - 08/27/17)   Testing/Procedures: NONE   Follow-Up: Your physician wants you to follow-up in: 1 Years with Dr. Lovena Le. You will receive a reminder letter in the mail two months in advance. If you don't receive a letter, please call our office to schedule the follow-up appointment.   Any Other Special Instructions Will Be Listed Below (If Applicable).     If you need a refill on your cardiac medications before your next appointment, please call your pharmacy.  Thank you for choosing [facility]!

## 2017-08-16 ENCOUNTER — Ambulatory Visit (INDEPENDENT_AMBULATORY_CARE_PROVIDER_SITE_OTHER): Payer: Medicare Other | Admitting: *Deleted

## 2017-08-16 DIAGNOSIS — Z5181 Encounter for therapeutic drug level monitoring: Secondary | ICD-10-CM

## 2017-08-16 DIAGNOSIS — I4891 Unspecified atrial fibrillation: Secondary | ICD-10-CM

## 2017-08-16 DIAGNOSIS — I480 Paroxysmal atrial fibrillation: Secondary | ICD-10-CM

## 2017-08-16 LAB — POCT INR: INR: 2.3

## 2017-08-16 NOTE — Patient Instructions (Signed)
Continue coumadin 1 1/2 tablets daily except 1 tablet on Mondays, Wednesdays and Fridays Recheck in 4 weeks 

## 2017-08-27 ENCOUNTER — Other Ambulatory Visit (HOSPITAL_COMMUNITY)
Admission: RE | Admit: 2017-08-27 | Discharge: 2017-08-27 | Disposition: A | Discharge status: Home / Self Care | Payer: Medicare Other | Source: Ambulatory Visit | Attending: Internal Medicine | Admitting: Internal Medicine

## 2017-08-27 DIAGNOSIS — Z79899 Other long term (current) drug therapy: Secondary | ICD-10-CM | POA: Insufficient documentation

## 2017-08-27 LAB — BASIC METABOLIC PANEL
Anion gap: 11 (ref 5–15)
BUN: 33 mg/dL — ABNORMAL HIGH (ref 6–20)
CALCIUM: 9.6 mg/dL (ref 8.9–10.3)
CO2: 27 mmol/L (ref 22–32)
Chloride: 102 mmol/L (ref 101–111)
Creatinine, Ser: 1.28 mg/dL — ABNORMAL HIGH (ref 0.44–1.00)
GFR calc Af Amer: 44 mL/min — ABNORMAL LOW (ref >60)
GFR, EST NON AFRICAN AMERICAN: 38 mL/min — AB (ref >60)
Glucose, Bld: 115 mg/dL — ABNORMAL HIGH (ref 65–99)
POTASSIUM: 4 mmol/L (ref 3.5–5.1)
Sodium: 140 mmol/L (ref 135–145)

## 2017-08-31 ENCOUNTER — Telehealth: Payer: Self-pay | Admitting: *Deleted

## 2017-08-31 NOTE — Telephone Encounter (Signed)
-----   Message from Damian Leavell, RN sent at 08/30/2017 11:04 AM EDT ----- Labs.

## 2017-08-31 NOTE — Telephone Encounter (Signed)
Called patient with test results. No answer. Left message to call back.  

## 2017-09-07 ENCOUNTER — Ambulatory Visit: Payer: Self-pay | Admitting: Orthopedic Surgery

## 2017-09-08 ENCOUNTER — Other Ambulatory Visit (HOSPITAL_COMMUNITY): Payer: Self-pay | Admitting: Pulmonary Disease

## 2017-09-08 DIAGNOSIS — Z1231 Encounter for screening mammogram for malignant neoplasm of breast: Secondary | ICD-10-CM

## 2017-09-10 ENCOUNTER — Ambulatory Visit (HOSPITAL_COMMUNITY)
Admission: RE | Admit: 2017-09-10 | Discharge: 2017-09-10 | Disposition: A | Discharge status: Home / Self Care | Payer: Medicare Other | Source: Ambulatory Visit | Attending: Pulmonary Disease | Admitting: Pulmonary Disease

## 2017-09-10 ENCOUNTER — Encounter (HOSPITAL_COMMUNITY): Payer: Self-pay

## 2017-09-10 DIAGNOSIS — Z1231 Encounter for screening mammogram for malignant neoplasm of breast: Secondary | ICD-10-CM | POA: Diagnosis present

## 2017-09-10 LAB — HM MAMMOGRAPHY

## 2017-09-15 ENCOUNTER — Ambulatory Visit (INDEPENDENT_AMBULATORY_CARE_PROVIDER_SITE_OTHER): Payer: Medicare Other | Admitting: *Deleted

## 2017-09-15 DIAGNOSIS — I480 Paroxysmal atrial fibrillation: Secondary | ICD-10-CM

## 2017-09-15 DIAGNOSIS — I4891 Unspecified atrial fibrillation: Secondary | ICD-10-CM | POA: Diagnosis not present

## 2017-09-15 DIAGNOSIS — Z5181 Encounter for therapeutic drug level monitoring: Secondary | ICD-10-CM

## 2017-09-15 LAB — POCT INR: INR: 2.6 (ref 2.0–3.0)

## 2017-09-15 NOTE — Patient Instructions (Signed)
Continue coumadin 1 1/2 tablets daily except 1 tablet on Mondays, Wednesdays and Fridays Recheck in 5 weeks 

## 2017-09-22 ENCOUNTER — Encounter: Payer: Self-pay | Admitting: Orthopedic Surgery

## 2017-09-22 ENCOUNTER — Ambulatory Visit: Payer: Medicare Other | Admitting: Orthopedic Surgery

## 2017-09-22 VITALS — BP 156/73 | HR 80 | Ht 60.0 in | Wt 168.0 lb

## 2017-09-22 DIAGNOSIS — I4891 Unspecified atrial fibrillation: Secondary | ICD-10-CM

## 2017-09-22 DIAGNOSIS — M1711 Unilateral primary osteoarthritis, right knee: Secondary | ICD-10-CM | POA: Diagnosis not present

## 2017-09-22 NOTE — Progress Notes (Signed)
NEW PATIENT OFFICE VISIT   Chief Complaint  Patient presents with  . Knee Pain    right knee pain for several years getting worse      MEDICAL DECISION SECTION  xrays ordered? no, prior x-rays were done dated August 05, 2017 and then we have a 2017 lumbar x-ray    My independent reading of xrays:   X-ray #1 right knee shows mild degenerative changes multiple vascular clips normal alignment  X-ray #2 lumbar spine shows degenerative disc disease facet arthritis L1-2 through S1   Encounter Diagnoses  Name Primary?  . Atrial fibrillation, unspecified type ([place]) Yes  . Primary osteoarthritis of right knee      PLAN:  Inject right knee joint Procedure note right knee injection verbal consent was obtained to inject right knee joint  Timeout was completed to confirm the site of injection  The medications used were 40 mg of Depo-Medrol and 1% lidocaine 3 cc  Anesthesia was provided by ethyl chloride and the skin was prepped with alcohol.  After cleaning the skin with alcohol a 20-gauge needle was used to inject the right knee joint. There were no complications. A sterile bandage was applied.  At this point I think she is having a lot of referred pain to the knee as the knee x-ray looks reasonably good for her age of 84 there was no trauma to the  She has a history of degenerative disc disease  We injected nonetheless to see if we can get a therapeutic and diagnostic results  Follow-up 3 weeks    INJECTION  yes No orders of the defined types were placed in this encounter.   FURTHER WORK UP PLANNED no     Chief Complaint  Patient presents with  . Knee Pain    right knee pain for several years getting worse     84 year old female presents for evaluation of right knee pain  The patient reports that she started having some dull aching pain in the right knee progressed to include the entire right leg  She has a history of lumbar degenerative disc disease  She  denies any trauma.  Pain is been present for about 6 weeks now associated with trouble weightbearing she had to go to a cane and a walker at times.   Review of Systems  Respiratory: Positive for shortness of breath.   Cardiovascular: Positive for palpitations.  Musculoskeletal: Positive for back pain.  Neurological: Negative for tingling, tremors, sensory change, focal weakness and weakness.     Past Medical History:  Diagnosis Date  . Atrial fibrillation ([place])    a. anticoagulated with Warfarin  . CHF (congestive heart failure) ([place])   . Chronic diastolic heart failure ([place])   . Complete heart block ([place])   . Coronary atherosclerosis of native coronary artery    Multivessel status post CABG, DES PLA March 2006  . Essential hypertension, benign   . Mixed hyperlipidemia   . Osteoarthritis   . Ventricular fibrillation ([place]) 2003   a. seen on PPM interrogation a/w syncope    Past Surgical History:  Procedure Laterality Date  . CARDIAC DEFIBRILLATOR PLACEMENT     MDT dual chamber ICD  . CARDIOVERSION N/A 08/09/2014   Procedure: CARDIOVERSION;  Surgeon: Sanda Klein, MD;  Location: [facility];  Service: Cardiovascular;  Laterality: N/A;  . CORONARY ARTERY BYPASS GRAFT     LIMA to LAD, SVG to diagonal, SVG to ramus and OM  . IMPLANTABLE CARDIOVERTER DEFIBRILLATOR GENERATOR CHANGE N/A  09/25/2013   Procedure: IMPLANTABLE CARDIOVERTER DEFIBRILLATOR GENERATOR CHANGE;  Surgeon: Evans Lance, MD;  Location: [facility] CATH LAB;  Service: Cardiovascular;  Laterality: N/A;  . LEAD REVISION N/A 09/29/2013   Procedure: LEAD REVISION;  Surgeon: Deboraha Sprang, MD;  Location: [facility] CATH LAB;  Service: Cardiovascular;  Laterality: N/A;  . TONSILLECTOMY    . YAG LASER APPLICATION Left 09/26/4852   Procedure: YAG LASER APPLICATION;  Surgeon: Elta Guadeloupe T. Gershon Crane, MD;  Location: [facility];  Service: Ophthalmology;  Laterality: Left;  . YAG LASER APPLICATION Right 10/14/348   Procedure: YAG LASER APPLICATION;   Surgeon: Elta Guadeloupe T. Gershon Crane, MD;  Location: [facility];  Service: Ophthalmology;  Laterality: Right;    Family History  Problem Relation Age of Onset  . Heart attack Father   . Heart attack Brother    Social History   Tobacco Use  . Smoking status: Never Smoker  . Smokeless tobacco: Never Used  Substance Use Topics  . Alcohol use: No    Alcohol/week: 0.0 oz  . Drug use: No    Allergies  Allergen Reactions  . Codeine Nausea And Vomiting  . Keflex [Cephalexin] Itching and Rash     Current Meds  Medication Sig  . acetaminophen (TYLENOL) 500 MG tablet Take 500 mg by mouth daily.   [name] albuterol (PROVENTIL HFA;VENTOLIN HFA) 108 (90 BASE) MCG/ACT inhaler Inhale 2 puffs into the lungs every 6 (six) hours as needed for wheezing or shortness of breath.  [name] amLODipine (NORVASC) 5 MG tablet TAKE (1) TABLET BY MOUTH ONCE DAILY.  [name] atorvastatin (LIPITOR) 20 MG tablet TAKE ONE TABLET BY MOUTH ONCE DAILY.  . Calcium Carbonate-Vitamin D (CALCIUM 600 + D PO) Take 1 tablet by mouth 2 (two) times daily.  . Cholecalciferol (VITAMIN D) 2000 UNITS CAPS Take 1 capsule by mouth at bedtime.   . dofetilide (TIKOSYN) 125 MCG capsule TAKE (1) CAPSULE BY MOUTH TWICE DAILY.  . folic acid (FOLVITE) 1 MG tablet Take 1 mg by mouth 2 (two) times daily.   . furosemide (LASIX) 80 MG tablet Take 1 tablet (80 mg total) by mouth daily.  [name] losartan (COZAAR) 100 MG tablet TAKE ONE TABLET BY MOUTH DAILY.  . metoprolol succinate (TOPROL-XL) 100 MG 24 hr tablet TAKE ONE TABLET BY MOUTH EVERY MORNING. TAKE WITH OR IMMEDIATELY FOLLOWING A MEAL.  Vladimir Faster Glycol-Propyl Glycol (SYSTANE OP) Place 1 drop into both eyes daily as needed (Dry eyes).  . potassium chloride SA (K-DUR,KLOR-CON) 20 MEQ tablet Take 20 mEq by mouth 3 (three) times daily.  [name] warfarin (COUMADIN) 2.5 MG tablet Take 1-1.5 tablets (2.5-3.75 mg total) by mouth as directed. Take 1 1/2 tablets daily except 1 tablet on Mondays and Thursdays. (Patient taking  differently: Take 2.5-3.75 mg by mouth as directed. Take 1 1/2 tablets daily except 1 tablet on Mondays, Wednesday and Friday.)    BP (!) 156/73   Pulse 80   Ht 5' (1.524 m)   Wt 168 lb (76.2 kg)   BMI 32.81 kg/m   Physical Exam  Constitutional: She is oriented to person, place, and time. She appears well-developed and well-nourished.  Musculoskeletal:       Right knee: She exhibits no effusion.       Left knee: She exhibits no effusion.  Neurological: She is alert and oriented to person, place, and time. Gait abnormal.  + cane   Psychiatric: She has a normal mood and affect. Judgment normal.  Vitals reviewed.   Right Knee Exam  Muscle Strength  The patient has normal right knee strength.  Tenderness  The patient is experiencing no tenderness.   Range of Motion  Extension: normal  Flexion: normal Right knee flexion: 125.   Tests  Mc[place]ray:  Medial - negative Lateral - negative Varus: negative Valgus: negative Drawer:  Anterior - negative    Posterior - negative  Other  Erythema: absent Scars: present Sensation: normal Pulse: present Swelling: none Effusion: no effusion present  Comments:  Her skin is erythematous tender and brittle in the pretibial region bilaterally with peripheral edema   Left Knee Exam   Muscle Strength  The patient has normal left knee strength.  Tenderness  The patient is experiencing no tenderness.   Range of Motion  Extension: normal  Flexion:  130 normal   Tests  McMurray:  Medial - negative Lateral - negative Varus: negative Valgus: negative Drawer:  Anterior - negative     Posterior - negative  Other  Erythema: absent Scars: present Sensation: normal Pulse: present Swelling: none Effusion: no effusion present        Arther Abbott, MD  09/22/2017 3:44 PM

## 2017-10-15 ENCOUNTER — Ambulatory Visit: Payer: Medicare Other | Admitting: Orthopedic Surgery

## 2017-10-15 ENCOUNTER — Ambulatory Visit (INDEPENDENT_AMBULATORY_CARE_PROVIDER_SITE_OTHER): Payer: Medicare Other

## 2017-10-15 VITALS — BP 140/75 | HR 85 | Ht 60.0 in | Wt 167.0 lb

## 2017-10-15 DIAGNOSIS — M545 Low back pain: Secondary | ICD-10-CM

## 2017-10-15 DIAGNOSIS — M1711 Unilateral primary osteoarthritis, right knee: Secondary | ICD-10-CM

## 2017-10-15 NOTE — Progress Notes (Signed)
Progress Note   Patient ID: [name] [name], female   DOB: [birth year], 84 y.o.   MRN: [id number] Chief Complaint  Patient presents with  . Follow-up    Recheck on right knee.  . Back Pain    Chronic back pain     Chief Complaint  Patient presents with  . Follow-up    Recheck on right knee.  . Back Pain    Chronic back pain    Follow-up visit 84 year old female status post injection right knee.  She says although the knee is not completely better it is much improved and she can pretty much deal with it now  She also has a history of chronic back pain her last x-ray was 2017 she had degenerative disc disease lumbar with facet arthritis L1-S1.  She is having some back pain in her lower back without radiation.  It appears to be noted when standing for long periods of time she does not notice any difference between flexion and extension    ROS  She denies having numbness or tingling in the legs he does not have any focal weakness she has no fever chills malaise or weight loss and no significant bowel or bladder dysfunction   No outpatient medications have been marked as taking for the 10/15/17 encounter (Office Visit) with Carole Civil, MD.    Past Medical History:  Diagnosis Date  . Atrial fibrillation ([place])    a. anticoagulated with Warfarin  . CHF (congestive heart failure) ([place])   . Chronic diastolic heart failure ([place])   . Complete heart block ([place])   . Coronary atherosclerosis of native coronary artery    Multivessel status post CABG, DES PLA March 2006  . Essential hypertension, benign   . Mixed hyperlipidemia   . Osteoarthritis   . Ventricular fibrillation ([place]) 2003   a. seen on PPM interrogation a/w syncope     Allergies  Allergen Reactions  . Codeine Nausea And Vomiting  . Keflex [Cephalexin] Itching and Rash     BP 140/75   Pulse 85   Ht 5' (1.524 m)   Wt 167 lb (75.8 kg)   BMI 32.61 kg/m    Physical Exam  Constitutional: She is oriented to  person, place, and time. She appears well-developed and well-nourished. No distress.  HENT:  Head: Normocephalic and atraumatic.  Right Ear: External ear normal.  Left Ear: External ear normal.  Nose: Nose normal.  Mouth/Throat: Oropharynx is clear and moist.  Eyes: Pupils are equal, round, and reactive to light. Conjunctivae and EOM are normal. Right eye exhibits no discharge. Left eye exhibits no discharge. No scleral icterus.  Cardiovascular: Normal rate, regular rhythm and intact distal pulses. PMI is not displaced.  Lymphadenopathy:       Right cervical: No superficial cervical adenopathy present.      Left cervical: No superficial cervical adenopathy present.       Right: No supraclavicular adenopathy present.       Left: No supraclavicular adenopathy present.  Neurological: She is alert and oriented to person, place, and time. She has normal strength and normal reflexes. She displays no atrophy and no tremor. No cranial nerve deficit or sensory deficit. She exhibits normal muscle tone. Coordination normal.  Skin: Skin is warm, dry and intact. Capillary refill takes less than 2 seconds. No rash noted. Rash is not nodular and not pustular. She is not diaphoretic. No cyanosis. Nails show no clubbing.  Psychiatric: She has a normal mood and affect.  Her speech is normal and behavior is normal. Judgment and thought content normal. Cognition and memory are normal.    Ortho Exam   Lumbar exam no specific extreme or noticeable tenderness today.  She can flex and her toes reach the ankles she had mild discomfort and then she had mild discomfort on extension but no difference between the 2.  Muscle function in both legs grade 5.  No sensory deficits bilaterally.  Normal pulse and perfusion normally.  No malalignments were noted in either leg.  Muscle tone normal no tremor.  Arther Abbott, MD   MEDICAL DECISION MAKING   Imaging:  X-ray lumbar spine  NEW PROBLEM  Encounter  Diagnosis  Name Primary?  . Low back pain, unspecified back pain laterality, unspecified chronicity, with sciatica presence unspecified Yes     PLAN: (RX., injection, surgery,frx,mri/ct, XR 2 body ares) #1 arthritis right knee no further treatment stable  2.  Lower back pain plan  No orders of the defined types were placed in this encounter.  12:07 PM 10/15/2017

## 2017-10-18 ENCOUNTER — Ambulatory Visit (INDEPENDENT_AMBULATORY_CARE_PROVIDER_SITE_OTHER): Payer: Medicare Other | Admitting: *Deleted

## 2017-10-18 ENCOUNTER — Telehealth: Payer: Self-pay | Admitting: Cardiology

## 2017-10-18 DIAGNOSIS — I472 Ventricular tachycardia: Secondary | ICD-10-CM

## 2017-10-18 DIAGNOSIS — I4729 Other ventricular tachycardia: Secondary | ICD-10-CM

## 2017-10-18 DIAGNOSIS — I5032 Chronic diastolic (congestive) heart failure: Secondary | ICD-10-CM

## 2017-10-18 NOTE — Telephone Encounter (Signed)
Pt had bmet in May, wants to wait and see if provider wants labs drawn again

## 2017-10-18 NOTE — Progress Notes (Signed)
Cardiology Office Note  Date: 10/19/2017   ID: [name], [name] [birth year], MRN [id number]  PCP: [name] Du, MD  Primary Cardiologist: [name] Lesches, MD   Chief Complaint  Patient presents with  . PAF    History of Present Illness: [name] [name] is an 84 y.o. female last seen in April.  She is here for a routine follow-up visit.  Reports no increasing shortness of breath or palpitations.  She is in the process of moving from one town home to the next, has been under stress, but is getting a lot of help from her children.  She remains on Coumadin with follow-up in the anticoagulation clinic.  Last INR was 2.6.  She bruises on her forearms, otherwise no major bleeding episodes.  She sees Dr. Lovena Le in the device clinic, Medtronic ICD in place.  Device check from April indicated 2.7% AT/AF episodes.  Lab work from May is outlined below.  We went over her medications.  There have been no changes from a cardiac perspective.  Past Medical History:  Diagnosis Date  . Atrial fibrillation ([name] [name])    a. anticoagulated with Warfarin  . CHF (congestive heart failure) ([name] [name])   . Chronic diastolic heart failure ([name] [name])   . Complete heart block ([name] [name])   . Coronary atherosclerosis of native coronary artery    Multivessel status post CABG, DES PLA March 2006  . Essential hypertension, benign   . Mixed hyperlipidemia   . Osteoarthritis   . Ventricular fibrillation ([name] [name]) 2003   a. seen on PPM interrogation a/w syncope    Past Surgical History:  Procedure Laterality Date  . CARDIAC DEFIBRILLATOR PLACEMENT     MDT dual chamber ICD  . CARDIOVERSION N/A 08/09/2014   Procedure: CARDIOVERSION;  Surgeon: Sanda Klein, MD;  [facility]: [facility];  Service: Cardiovascular;  Laterality: N/A;  . CORONARY ARTERY BYPASS GRAFT     LIMA to LAD, SVG to diagonal, SVG to ramus and OM  . IMPLANTABLE CARDIOVERTER DEFIBRILLATOR GENERATOR CHANGE N/A 09/25/2013   Procedure: IMPLANTABLE CARDIOVERTER  DEFIBRILLATOR GENERATOR CHANGE;  Surgeon: Evans Lance, MD;  Location: [facility] CATH LAB;  Service: Cardiovascular;  Laterality: N/A;  . LEAD REVISION N/A 09/29/2013   Procedure: LEAD REVISION;  Surgeon: Deboraha Sprang, MD;  Location: [facility] CATH LAB;  Service: Cardiovascular;  Laterality: N/A;  . TONSILLECTOMY    . YAG LASER APPLICATION Left 0/12/3265   Procedure: YAG LASER APPLICATION;  Surgeon: Elta Guadeloupe T. Gershon Crane, MD;  Location: [facility];  Service: Ophthalmology;  Laterality: Left;  . YAG LASER APPLICATION Right 05/14/5807   Procedure: YAG LASER APPLICATION;  Surgeon: Elta Guadeloupe T. Gershon Crane, MD;  Location: [facility];  Service: Ophthalmology;  Laterality: Right;    Current Outpatient Medications  Medication Sig Dispense Refill  . acetaminophen (TYLENOL) 500 MG tablet Take 500 mg by mouth daily.     [name] albuterol (PROVENTIL HFA;VENTOLIN HFA) 108 (90 BASE) MCG/ACT inhaler Inhale 2 puffs into the lungs every 6 (six) hours as needed for wheezing or shortness of breath. 1 Inhaler 2  . amLODipine (NORVASC) 5 MG tablet TAKE (1) TABLET BY MOUTH ONCE DAILY. 90 tablet 3  . atorvastatin (LIPITOR) 20 MG tablet TAKE ONE TABLET BY MOUTH ONCE DAILY. 90 tablet 3  . Calcium Carbonate-Vitamin D (CALCIUM 600 + D PO) Take 1 tablet by mouth 2 (two) times daily.    . Cholecalciferol (VITAMIN D) 2000 UNITS CAPS Take 1 capsule by mouth at bedtime.     . dofetilide (TIKOSYN)  125 MCG capsule TAKE (1) CAPSULE BY MOUTH TWICE DAILY. 161 capsule 1  . folic acid (FOLVITE) 1 MG tablet Take 1 mg by mouth 2 (two) times daily.     . furosemide (LASIX) 80 MG tablet Take 1 tablet (80 mg total) by mouth daily. 90 tablet 3  . losartan (COZAAR) 100 MG tablet TAKE ONE TABLET BY MOUTH DAILY. 90 tablet 3  . metoprolol succinate (TOPROL-XL) 100 MG 24 hr tablet TAKE ONE TABLET BY MOUTH EVERY MORNING. TAKE WITH OR IMMEDIATELY FOLLOWING A MEAL. 90 tablet 3  . Polyethyl Glycol-Propyl Glycol (SYSTANE OP) Place 1 drop into both eyes daily as needed (Dry eyes).    .  potassium chloride SA (K-DUR,KLOR-CON) 20 MEQ tablet Take 20 mEq by mouth 3 (three) times daily.    [name] warfarin (COUMADIN) 2.5 MG tablet Take 1-1.5 tablets (2.5-3.75 mg total) by mouth as directed. Take 1 1/2 tablets daily except 1 tablet on Mondays and Thursdays. (Patient taking differently: Take 2.5-3.75 mg by mouth as directed. Take 1 1/2 tablets daily except 1 tablet on Mondays, Wednesday and Friday.) 60 tablet 5   No current facility-administered medications for this visit.    Allergies:  Codeine and Keflex [cephalexin]   Social History: The patient  reports that she has never smoked. She has never used smokeless tobacco. She reports that she does not drink alcohol or use drugs.   ROS:  Please see the history of present illness. Otherwise, complete review of systems is positive for hearing loss, arthritic knee and back pain.  All other systems are reviewed and negative.   Physical Exam: VS:  BP 124/62   Pulse 84   Ht 5' (1.524 m)   Wt 169 lb (76.7 kg)   SpO2 96%   BMI 33.01 kg/m , BMI Body mass index is 33.01 kg/m.  Wt Readings from Last 3 Encounters:  10/19/17 169 lb (76.7 kg)  10/15/17 167 lb (75.8 kg)  09/22/17 168 lb (76.2 kg)    General: Elderly woman, appears comfortable at rest. HEENT: Conjunctiva and lids normal, oropharynx clear. Neck: Supple, no elevated JVP or carotid bruits, no thyromegaly. Lungs: Clear to auscultation, nonlabored breathing at rest. Cardiac: Regular rate and rhythm, no S3, 2/6 systolic murmur. Abdomen: Soft, nontender, bowel sounds present. Extremities: Mild lower leg edema, distal pulses 2+. Skin: Warm and dry. Musculoskeletal: No kyphosis. Neuropsychiatric: Alert and oriented x3, affect grossly appropriate.  ECG: I personally reviewed the tracing from 05/04/2017 which showed dual-chamber pacing.  Recent Labwork: 05/04/2017: B Natriuretic Peptide 254.0 05/05/2017: ALT 24; AST 28; Hemoglobin 14.9; Platelets 169 08/27/2017: BUN 33; Creatinine, Ser  1.28; Potassium 4.0; Sodium 140     Component Value Date/Time   CHOL 111 01/24/2013 0946   TRIG 119 01/24/2013 0946   HDL 31 (L) 01/24/2013 0946   CHOLHDL 3.6 01/24/2013 0946   VLDL 24 01/24/2013 0946   LDLCALC 56 01/24/2013 0946    Other Studies Reviewed Today:  Carlton Adam Cardiolite 06/15/2014: FINDINGS: Pharmacological stress  Baseline EKG shows ventricular pacing. After injection heart rate increased from 61 beats per min up to 75 beats per min, and blood pressure decreased from 139/76 and 113/66. The test was stopped after injection was complete, the patient did not experience any chest pain. Post-injection EKG showed no significant arrhythmias, findings not interpretable for ischemia because she was ventricular paced throughout the study.  Perfusion: No decreased activity in the left ventricle on stress imaging to suggest reversible ischemia or infarction.  Wall Motion: Normal  left ventricular wall motion. No left ventricular dilation.  Left Ventricular Ejection Fraction: 77 %  End diastolic volume 55 ml  End systolic volume 12 ml  IMPRESSION: 1. No reversible ischemia or infarction.  2. Normal left ventricular wall motion.  3. Left ventricular ejection fraction 77%  4. Low-risk stress test findings*.  Echocardiogram 09/13/2013: Study Conclusions  - Left ventricle: The cavity size was normal. Wall thickness was increased in a pattern of mild LVH. Systolic function was normal. The estimated ejection fraction was in the range of 60% to 65%. Wall motion was normal; there were no regional wall motion abnormalities. Features are consistent with a pseudonormal left ventricular filling pattern, with concomitant abnormal relaxation and increased filling pressure (grade 2 diastolic dysfunction). Doppler parameters are consistent with elevated ventricular end-diastolic filling pressure. - Aortic valve: Mildly calcified annulus. Trileaflet;  mildly calcified leaflets. There was very mild stenosis. There was no significant regurgitation. Mean gradient (S): 9 mm Hg. Valve area (VTI): 1.52 cm^2. Valve area (Vmax): 1.71 cm^2. - Mitral valve: Calcified annulus. Mildly thickened leaflets . There was mild regurgitation. - Left atrium: The atrium was mildly to moderately dilated. - Right ventricle: The cavity size was mildly dilated. Pacer wire or catheter noted in right ventricle. - Right atrium: Central venous pressure (est): 3 mm Hg. - Atrial septum: No defect or patent foramen ovale was identified. - Tricuspid valve: There was mild regurgitation. - Pulmonary arteries: PA peak pressure: 42 mm Hg (S). - Pericardium, extracardiac: There was no pericardial effusion.  Impressions:  - Mild LVH with LVEF 83-29%, grade 2 diastolic dysfunction with increased filling pressures. MIld to moderate left atrial enlargement. Mild mitral regurgitation. Very mildly stenotic aortic valve. Device wire noted in right heart. Mild tricuspid regurgitation with PASP 42 mmHg.  Assessment and Plan:  1.  Paroxysmal atrial fibrillation, low arrhythmia burden based on device interrogation, and no significant palpitations.  She continues on antiarrhythmic therapy with Tikosyn followed by Dr. Lovena Le, and remains on Coumadin with follow-up in the anticoagulation clinic.  2.  Medtronic ICD in place.  No device shocks.  3.  CAD status post CABG and DES to the PLA.  She reports no active angina.  She is on beta-blocker and statin.  4.  History of diastolic heart failure, clinically stable and continues on Lasix.  Last creatinine 1.28.  Current medicines were reviewed with the patient today.  Disposition: Follow-up in 4 months.  Signed, Satira Sark, MD, [facility] 10/19/2017 2:04 PM    [place] [facility] [address] [place], [place] [address] Phone: [phone]; Fax: [phone]

## 2017-10-18 NOTE — Telephone Encounter (Signed)
Patient is questioning if she needs labwork prior to this apointment. States that she just had labwork done for Dr.Taylor. / tg

## 2017-10-19 ENCOUNTER — Ambulatory Visit: Payer: Medicare Other | Admitting: Cardiology

## 2017-10-19 ENCOUNTER — Encounter: Payer: Self-pay | Admitting: Cardiology

## 2017-10-19 VITALS — BP 124/62 | HR 84 | Ht 60.0 in | Wt 169.0 lb

## 2017-10-19 DIAGNOSIS — I480 Paroxysmal atrial fibrillation: Secondary | ICD-10-CM

## 2017-10-19 DIAGNOSIS — I5032 Chronic diastolic (congestive) heart failure: Secondary | ICD-10-CM | POA: Diagnosis not present

## 2017-10-19 DIAGNOSIS — I25119 Atherosclerotic heart disease of native coronary artery with unspecified angina pectoris: Secondary | ICD-10-CM

## 2017-10-19 DIAGNOSIS — Z95810 Presence of automatic (implantable) cardiac defibrillator: Secondary | ICD-10-CM

## 2017-10-19 NOTE — Progress Notes (Signed)
Remote ICD transmission.   

## 2017-10-19 NOTE — Patient Instructions (Signed)
Your physician recommends that you schedule a follow-up appointment in: 4 months with Dr.McDowell    Your physician recommends that you continue on your current medications as directed. Please refer to the Current Medication list given to you today.    If you need a refill on your cardiac medications before your next appointment, please call your pharmacy.     No lab work or tests today.      Thank you for choosing [facility] !

## 2017-10-20 ENCOUNTER — Ambulatory Visit (INDEPENDENT_AMBULATORY_CARE_PROVIDER_SITE_OTHER): Payer: Medicare Other | Admitting: *Deleted

## 2017-10-20 ENCOUNTER — Encounter: Payer: Self-pay | Admitting: Cardiology

## 2017-10-20 DIAGNOSIS — I480 Paroxysmal atrial fibrillation: Secondary | ICD-10-CM

## 2017-10-20 DIAGNOSIS — I4891 Unspecified atrial fibrillation: Secondary | ICD-10-CM | POA: Diagnosis not present

## 2017-10-20 DIAGNOSIS — Z5181 Encounter for therapeutic drug level monitoring: Secondary | ICD-10-CM | POA: Diagnosis not present

## 2017-10-20 LAB — POCT INR: INR: 2 (ref 2.0–3.0)

## 2017-10-20 NOTE — Patient Instructions (Signed)
Take coumadin 1 1/2 tablets tonight then resume 1 1/2 tablets daily except 1 tablet on Mondays, Wednesdays and Fridays Recheck in 6 weeks

## 2017-10-23 LAB — CUP PACEART REMOTE DEVICE CHECK
Battery Voltage: 2.95 V
Brady Statistic AP VP Percent: 93.62 %
Brady Statistic AP VS Percent: 0 %
Brady Statistic RA Percent Paced: 93.18 %
Brady Statistic RV Percent Paced: 99.87 %
HIGH POWER IMPEDANCE MEASURED VALUE: 59 Ohm
HIGH POWER IMPEDANCE MEASURED VALUE: 78 Ohm
Implantable Lead Implant Date: 20030823
Implantable Lead Location: 753859
Implantable Lead Model: 157
Implantable Lead Model: 4086
Implantable Lead Serial Number: 112190
Implantable Pulse Generator Implant Date: 20150608
Lead Channel Impedance Value: 342 Ohm
Lead Channel Impedance Value: 342 Ohm
Lead Channel Impedance Value: 475 Ohm
Lead Channel Pacing Threshold Amplitude: 0.75 V
Lead Channel Pacing Threshold Pulse Width: 0.4 ms
Lead Channel Sensing Intrinsic Amplitude: 10 mV
Lead Channel Setting Pacing Amplitude: 2 V
Lead Channel Setting Pacing Amplitude: 2.5 V
Lead Channel Setting Sensing Sensitivity: 0.3 mV
MDC IDC LEAD IMPLANT DT: 20030823
MDC IDC LEAD LOCATION: 753860
MDC IDC LEAD SERIAL: 108215
MDC IDC MSMT BATTERY REMAINING LONGEVITY: 37 mo
MDC IDC MSMT LEADCHNL RA PACING THRESHOLD PULSEWIDTH: 0.4 ms
MDC IDC MSMT LEADCHNL RA SENSING INTR AMPL: 3 mV
MDC IDC MSMT LEADCHNL RA SENSING INTR AMPL: 3 mV
MDC IDC MSMT LEADCHNL RV PACING THRESHOLD AMPLITUDE: 1 V
MDC IDC MSMT LEADCHNL RV SENSING INTR AMPL: 10 mV
MDC IDC SESS DTM: 20190701172407
MDC IDC SET LEADCHNL RV PACING PULSEWIDTH: 0.4 ms
MDC IDC STAT BRADY AS VP PERCENT: 6.37 %
MDC IDC STAT BRADY AS VS PERCENT: 0.01 %

## 2017-11-06 ENCOUNTER — Other Ambulatory Visit: Payer: Self-pay | Admitting: Internal Medicine

## 2017-12-01 ENCOUNTER — Ambulatory Visit (INDEPENDENT_AMBULATORY_CARE_PROVIDER_SITE_OTHER): Payer: Medicare Other | Admitting: *Deleted

## 2017-12-01 DIAGNOSIS — I4891 Unspecified atrial fibrillation: Secondary | ICD-10-CM | POA: Diagnosis not present

## 2017-12-01 DIAGNOSIS — I480 Paroxysmal atrial fibrillation: Secondary | ICD-10-CM

## 2017-12-01 DIAGNOSIS — Z5181 Encounter for therapeutic drug level monitoring: Secondary | ICD-10-CM | POA: Diagnosis not present

## 2017-12-01 LAB — POCT INR: INR: 2.4 (ref 2.0–3.0)

## 2017-12-01 NOTE — Patient Instructions (Signed)
Continue coumadin 1 1/2  tablets daily except 1 tablet on Mondays, Wednesdays and Fridays  Recheck in 6 weeks 

## 2017-12-08 ENCOUNTER — Telehealth: Payer: Self-pay | Admitting: Orthopedic Surgery

## 2017-12-08 NOTE — Telephone Encounter (Addendum)
Yolanda from Dr. Luan Pulling' office requested an earlier appointment for this patient.  I told her that I did not have an available appointments at this time but I would listen out for any cancellations.  I have spoken with the other office staff and let them know of this.  I have also put Ms. Hall Busing on the wait list.  I called and spoke to Ms. Hall Busing.  She said she is in the process of moving.  She also said that Dr. Luan Pulling had called her in some Prednisone and she was feeling better.  I told her that if we had an earlier appointment available, I would give her a call.  She said that was fine.

## 2018-01-12 ENCOUNTER — Ambulatory Visit (INDEPENDENT_AMBULATORY_CARE_PROVIDER_SITE_OTHER): Payer: Medicare Other | Admitting: *Deleted

## 2018-01-12 DIAGNOSIS — I4891 Unspecified atrial fibrillation: Secondary | ICD-10-CM

## 2018-01-12 DIAGNOSIS — Z5181 Encounter for therapeutic drug level monitoring: Secondary | ICD-10-CM

## 2018-01-12 DIAGNOSIS — I480 Paroxysmal atrial fibrillation: Secondary | ICD-10-CM

## 2018-01-12 LAB — POCT INR: INR: 3 (ref 2.0–3.0)

## 2018-01-12 NOTE — Patient Instructions (Signed)
Continue coumadin 1 1/2  tablets daily except 1 tablet on Mondays, Wednesdays and Fridays  Recheck in 6 weeks 

## 2018-01-17 ENCOUNTER — Ambulatory Visit (INDEPENDENT_AMBULATORY_CARE_PROVIDER_SITE_OTHER): Payer: Medicare Other | Admitting: *Deleted

## 2018-01-17 DIAGNOSIS — I4729 Other ventricular tachycardia: Secondary | ICD-10-CM

## 2018-01-17 DIAGNOSIS — I5032 Chronic diastolic (congestive) heart failure: Secondary | ICD-10-CM

## 2018-01-17 DIAGNOSIS — I472 Ventricular tachycardia: Secondary | ICD-10-CM | POA: Diagnosis not present

## 2018-01-18 NOTE — Progress Notes (Signed)
Remote ICD transmission.   

## 2018-01-19 ENCOUNTER — Encounter

## 2018-01-19 ENCOUNTER — Ambulatory Visit: Payer: Medicare Other | Admitting: Orthopedic Surgery

## 2018-01-19 LAB — TSH: TSH: 1.93 (ref <=5.90)

## 2018-01-21 LAB — CUP PACEART REMOTE DEVICE CHECK
Battery Remaining Longevity: 35 mo
Brady Statistic AP VP Percent: 90.95 %
Brady Statistic AS VS Percent: 0.01 %
Brady Statistic RV Percent Paced: 99.84 %
Date Time Interrogation Session: 20190930071803
HIGH POWER IMPEDANCE MEASURED VALUE: 79 Ohm
HighPow Impedance: 62 Ohm
Implantable Lead Location: 753860
Implantable Lead Serial Number: 112190
Implantable Pulse Generator Implant Date: 20150608
Lead Channel Impedance Value: 342 Ohm
Lead Channel Pacing Threshold Pulse Width: 0.4 ms
Lead Channel Sensing Intrinsic Amplitude: 10 mV
Lead Channel Sensing Intrinsic Amplitude: 3 mV
Lead Channel Setting Pacing Amplitude: 2 V
Lead Channel Setting Sensing Sensitivity: 0.3 mV
MDC IDC LEAD IMPLANT DT: 20030823
MDC IDC LEAD IMPLANT DT: 20030823
MDC IDC LEAD LOCATION: 753859
MDC IDC LEAD SERIAL: 108215
MDC IDC MSMT BATTERY VOLTAGE: 2.95 V
MDC IDC MSMT LEADCHNL RA IMPEDANCE VALUE: 418 Ohm
MDC IDC MSMT LEADCHNL RA PACING THRESHOLD AMPLITUDE: 0.875 V
MDC IDC MSMT LEADCHNL RA SENSING INTR AMPL: 3 mV
MDC IDC MSMT LEADCHNL RV IMPEDANCE VALUE: 342 Ohm
MDC IDC MSMT LEADCHNL RV PACING THRESHOLD AMPLITUDE: 1.25 V
MDC IDC MSMT LEADCHNL RV PACING THRESHOLD PULSEWIDTH: 0.4 ms
MDC IDC MSMT LEADCHNL RV SENSING INTR AMPL: 10 mV
MDC IDC SET LEADCHNL RV PACING AMPLITUDE: 2.5 V
MDC IDC SET LEADCHNL RV PACING PULSEWIDTH: 0.4 ms
MDC IDC STAT BRADY AP VS PERCENT: 0 %
MDC IDC STAT BRADY AS VP PERCENT: 9.04 %
MDC IDC STAT BRADY RA PERCENT PACED: 90.55 %

## 2018-01-24 ENCOUNTER — Other Ambulatory Visit: Payer: Self-pay | Admitting: Cardiology

## 2018-01-26 ENCOUNTER — Ambulatory Visit (INDEPENDENT_AMBULATORY_CARE_PROVIDER_SITE_OTHER): Payer: Medicare Other

## 2018-01-26 ENCOUNTER — Ambulatory Visit: Payer: Medicare Other | Admitting: Orthopedic Surgery

## 2018-01-26 ENCOUNTER — Encounter: Payer: Self-pay | Admitting: Orthopedic Surgery

## 2018-01-26 VITALS — BP 153/67 | HR 67 | Ht 60.0 in | Wt 166.0 lb

## 2018-01-26 DIAGNOSIS — M542 Cervicalgia: Secondary | ICD-10-CM

## 2018-01-26 MED ORDER — METHOCARBAMOL 500 MG PO TABS: 500.0000 mg | ORAL_TABLET | Freq: Three times a day (TID) | ORAL | 1 refills | Status: DC

## 2018-01-26 NOTE — Patient Instructions (Addendum)
Use heating pad on your neck or warm moist towels  Your muscle relaxer will be sent in to pharmacy

## 2018-01-26 NOTE — Progress Notes (Signed)
NEW problem  Chief Complaint  Patient presents with  . Neck Pain    for past week no injury   . Knee Pain    right also has right leg pain ? from back     84 years old comes in for her neck with extreme pain over a week decreased range of motion no trauma she has a dull throbbing pain with decreased range of motion and a spasm on the left side of the neck which is getting worse no other symptoms of numbness tingling or weakness   Review of Systems  Constitutional: Negative.   Neurological: Negative.      Past Medical History:  Diagnosis Date  . Atrial fibrillation ([place])    a. anticoagulated with Warfarin  . CHF (congestive heart failure) ([place])   . Chronic diastolic heart failure ([place])   . Complete heart block ([place])   . Coronary atherosclerosis of native coronary artery    Multivessel status post CABG, DES PLA March 2006  . Essential hypertension, benign   . Mixed hyperlipidemia   . Osteoarthritis   . Ventricular fibrillation ([place]) 2003   a. seen on PPM interrogation a/w syncope    Past Surgical History:  Procedure Laterality Date  . CARDIAC DEFIBRILLATOR PLACEMENT     MDT dual chamber ICD  . CARDIOVERSION N/A 08/09/2014   Procedure: CARDIOVERSION;  Surgeon: Sanda Klein, MD;  Location: [facility];  Service: Cardiovascular;  Laterality: N/A;  . CORONARY ARTERY BYPASS GRAFT     LIMA to LAD, SVG to diagonal, SVG to ramus and OM  . IMPLANTABLE CARDIOVERTER DEFIBRILLATOR GENERATOR CHANGE N/A 09/25/2013   Procedure: IMPLANTABLE CARDIOVERTER DEFIBRILLATOR GENERATOR CHANGE;  Surgeon: Evans Lance, MD;  Location: [facility] CATH LAB;  Service: Cardiovascular;  Laterality: N/A;  . LEAD REVISION N/A 09/29/2013   Procedure: LEAD REVISION;  Surgeon: Deboraha Sprang, MD;  Location: [facility] CATH LAB;  Service: Cardiovascular;  Laterality: N/A;  . TONSILLECTOMY    . YAG LASER APPLICATION Left 1/0/2585   Procedure: YAG LASER APPLICATION;  Surgeon: Elta Guadeloupe T. Gershon Crane, MD;  Location: [facility] OR[facility];   Service: Ophthalmology;  Laterality: Left;  . YAG LASER APPLICATION Right 2/77/8242   Procedure: YAG LASER APPLICATION;  Surgeon: Elta Guadeloupe T. Gershon Crane, MD;  Location: [facility] OR[facility];  Service: Ophthalmology;  Laterality: Right;    Family History  Problem Relation Age of Onset  . Heart attack Father   . Heart attack Brother    Social History   Tobacco Use  . Smoking status: Never Smoker  . Smokeless tobacco: Never Used  Substance Use Topics  . Alcohol use: No    Alcohol/week: 0.0 standard drinks  . Drug use: No    Allergies  Allergen Reactions  . Codeine Nausea And Vomiting  . Keflex [Cephalexin] Itching and Rash    Current Meds  Medication Sig  . acetaminophen (TYLENOL) 500 MG tablet Take 500 mg by mouth daily.   [name] albuterol (PROVENTIL HFA;VENTOLIN HFA) 108 (90 BASE) MCG/ACT inhaler Inhale 2 puffs into the lungs every 6 (six) hours as needed for wheezing or shortness of breath.  [name] amLODipine (NORVASC) 5 MG tablet TAKE (1) TABLET BY MOUTH ONCE DAILY.  [name] atorvastatin (LIPITOR) 20 MG tablet TAKE ONE TABLET BY MOUTH ONCE DAILY.  . Calcium Carbonate-Vitamin D (CALCIUM 600 + D PO) Take 1 tablet by mouth 2 (two) times daily.  . Cholecalciferol (VITAMIN D) 2000 UNITS CAPS Take 1 capsule by mouth at bedtime.   . dofetilide (TIKOSYN) 125 MCG  capsule TAKE (1) CAPSULE BY MOUTH TWICE DAILY.  . folic acid (FOLVITE) 1 MG tablet Take 1 mg by mouth 2 (two) times daily.   . furosemide (LASIX) 80 MG tablet Take 1 tablet (80 mg total) by mouth daily.  [name] losartan (COZAAR) 100 MG tablet TAKE ONE TABLET BY MOUTH DAILY.  . metoprolol succinate (TOPROL-XL) 100 MG 24 hr tablet TAKE ONE TABLET BY MOUTH EVERY MORNING. TAKE WITH OR IMMEDIATELY FOLLOWING A MEAL.  Vladimir Faster Glycol-Propyl Glycol (SYSTANE OP) Place 1 drop into both eyes daily as needed (Dry eyes).  . potassium chloride SA (K-DUR,KLOR-CON) 20 MEQ tablet Take 20 mEq by mouth 3 (three) times daily.  [name] warfarin (COUMADIN) 2.5 MG tablet Take 1-1.5 tablets  (2.5-3.75 mg total) by mouth as directed. Take 1 1/2 tablets daily except 1 tablet on Mondays and Thursdays. (Patient taking differently: Take 2.5-3.75 mg by mouth as directed. Take 1 1/2 tablets daily except 1 tablet on Mondays, Wednesday and Friday.)    BP (!) 153/67   Pulse 67   Ht 5' (1.524 m)   Wt 166 lb (75.3 kg)   BMI 32.42 kg/m   Physical Exam  Constitutional: She is oriented to person, place, and time. She appears well-developed and well-nourished.  Neurological: She is alert and oriented to person, place, and time. Gait normal.  Psychiatric: She has a normal mood and affect.  Vitals reviewed.   Ortho Exam  Cervical spine patient has tenderness on the left side of the cervical spine is excessive hump at the base of the cervical spine and cervicothoracic junction range of motion is limited in rotation to the right she has pain with flexion down and none with extension.  Right and left arm normal range of motion both upper extremities have good strength skin is normal including the neck she has no lymphadenopathy in the cervical region she has normal sensation in both hands Hoffmann signs are negative reflexes are 2+ and equal hands are coordinated well.    MEDICAL DECISION SECTION  Xrays were done at [facility] film in the office shows cervical spondylosis moderate to severe see report   Encounter Diagnosis  Name Primary?  . Neck pain Yes    PLAN: (Rx., injectx, surgery, frx, mri/ct) Follow-up in 2 weeks take the medication as prescribed use heat  Meds ordered this encounter  Medications  . methocarbamol (ROBAXIN) 500 MG tablet    Sig: Take 1 tablet (500 mg total) by mouth 3 (three) times daily.    Dispense:  60 tablet    Refill:  1    Arther Abbott, MD  01/26/2018 4:09 PM

## 2018-02-09 ENCOUNTER — Ambulatory Visit: Payer: Medicare Other | Admitting: Orthopedic Surgery

## 2018-02-09 ENCOUNTER — Encounter: Payer: Self-pay | Admitting: Orthopedic Surgery

## 2018-02-09 DIAGNOSIS — M5030 Other cervical disc degeneration, unspecified cervical region: Secondary | ICD-10-CM

## 2018-02-09 MED ORDER — ORPHENADRINE CITRATE ER 100 MG PO TB12: 100.0000 mg | ORAL_TABLET | Freq: Two times a day (BID) | ORAL | 1 refills | Status: DC

## 2018-02-09 NOTE — Progress Notes (Signed)
Chief Complaint  Patient presents with  . Neck Pain    left side not better    84 year old female with left-sided cervical spine pain radiating to the left arm decreased range of motion trying her on some Robaxin did not improve  She has a pacemaker and a defibrillator cannot do an MRI she is on warfarin cannot take NSAIDs  Review of Systems  Neurological: Positive for tingling. Negative for sensory change, focal weakness and weakness.   BP (!) 154/66   Pulse 65   Ht 5' (1.524 m)   Wt 170 lb (77.1 kg)   BMI 33.20 kg/m  Physical Exam  Musculoskeletal:       Back:    Encounter Diagnosis  Name Primary?  . Degenerative disc disease, cervical     Meds ordered this encounter  Medications  . orphenadrine (NORFLEX) 100 MG tablet    Sig: Take 1 tablet (100 mg total) by mouth 2 (two) times daily.    Dispense:  30 tablet    Refill:  1    CT scan recheck after CT scan start Norflex

## 2018-02-10 ENCOUNTER — Telehealth: Payer: Self-pay | Admitting: Radiology

## 2018-02-10 NOTE — Telephone Encounter (Signed)
Spoke to patient to advise and follow up made for the next day.

## 2018-02-10 NOTE — Telephone Encounter (Signed)
They can do the scan on Tues Oct 29th at 6:45 arrival for 7 pm scan.  Left message for her to advise and asked her to call me back  Need to make her a follow up appt with Dr Aline Brochure also.

## 2018-02-15 ENCOUNTER — Ambulatory Visit (HOSPITAL_COMMUNITY)
Admission: RE | Admit: 2018-02-15 | Discharge: 2018-02-15 | Disposition: A | Discharge status: Home / Self Care | Payer: Medicare Other | Source: Ambulatory Visit | Attending: Orthopedic Surgery | Admitting: Orthopedic Surgery

## 2018-02-15 DIAGNOSIS — M5030 Other cervical disc degeneration, unspecified cervical region: Secondary | ICD-10-CM | POA: Diagnosis not present

## 2018-02-15 NOTE — Progress Notes (Signed)
Cardiology Office Note  Date: 02/16/2018   ID: [name], [name] 11-30-1931, MRN [id number]  PCP: Sinda Du, MD  Primary Cardiologist: Rozann Lesches, MD   Chief Complaint  Patient presents with  . Atrial Fibrillation    History of Present Illness: [name] [name] is an 84 y.o. female last seen in July.  She is here for a routine follow-up visit.  Since last evaluation she does not report any significant palpitations or chest pain.  She has been having more problems with orthopedic pain, mainly lower back and right knee.  She has degenerative disc disease.  She continues to follow in the anticoagulation clinic on Coumadin.  Last INR was 3.0.  She does not report any spontaneous bleeding problems.  We went over her cardiac medications which are stable and outlined below.  I also reviewed her interval lab work from Dr. Luan Pulling.  She sees Dr. Lovena Le in the device clinic, Medtronic ICD in place.  Recent device interrogation showed 5.2% AT/AF.  Past Medical History:  Diagnosis Date  . Atrial fibrillation ([place])    a. anticoagulated with Warfarin  . CHF (congestive heart failure) ([place])   . Chronic diastolic heart failure ([place])   . Complete heart block ([place])   . Coronary atherosclerosis of native coronary artery    Multivessel status post CABG, DES PLA March 2006  . Essential hypertension, benign   . Mixed hyperlipidemia   . Osteoarthritis   . Ventricular fibrillation ([place]) 2003   a. seen on PPM interrogation a/w syncope    Past Surgical History:  Procedure Laterality Date  . CARDIAC DEFIBRILLATOR PLACEMENT     MDT dual chamber ICD  . CARDIOVERSION N/A 08/09/2014   Procedure: CARDIOVERSION;  Surgeon: Sanda Klein, MD;  Location: [facility];  Service: Cardiovascular;  Laterality: N/A;  . CORONARY ARTERY BYPASS GRAFT     LIMA to LAD, SVG to diagonal, SVG to ramus and OM  . IMPLANTABLE CARDIOVERTER DEFIBRILLATOR GENERATOR CHANGE N/A 09/25/2013   Procedure: IMPLANTABLE  CARDIOVERTER DEFIBRILLATOR GENERATOR CHANGE;  Surgeon: Evans Lance, MD;  Location: [facility] CATH LAB;  Service: Cardiovascular;  Laterality: N/A;  . LEAD REVISION N/A 09/29/2013   Procedure: LEAD REVISION;  Surgeon: Deboraha Sprang, MD;  Location: [facility] CATH LAB;  Service: Cardiovascular;  Laterality: N/A;  . TONSILLECTOMY    . YAG LASER APPLICATION Left 10/23/1948   Procedure: YAG LASER APPLICATION;  Surgeon: Elta Guadeloupe T. Gershon Crane, MD;  Location: [facility] ORS;  Service: Ophthalmology;  Laterality: Left;  . YAG LASER APPLICATION Right 9/32/6712   Procedure: YAG LASER APPLICATION;  Surgeon: Elta Guadeloupe T. Gershon Crane, MD;  Location: [facility] OR[facility];  Service: Ophthalmology;  Laterality: Right;    Current Outpatient Medications  Medication Sig Dispense Refill  . acetaminophen (TYLENOL) 500 MG tablet Take 500 mg by mouth daily.     [name] albuterol (PROVENTIL HFA;VENTOLIN HFA) 108 (90 BASE) MCG/ACT inhaler Inhale 2 puffs into the lungs every 6 (six) hours as needed for wheezing or shortness of breath. 1 Inhaler 2  . amLODipine (NORVASC) 5 MG tablet TAKE (1) TABLET BY MOUTH ONCE DAILY. 90 tablet 3  . atorvastatin (LIPITOR) 20 MG tablet TAKE ONE TABLET BY MOUTH ONCE DAILY. 90 tablet 3  . Calcium Carbonate-Vitamin D (CALCIUM 600 + D PO) Take 1 tablet by mouth 2 (two) times daily.    . Cholecalciferol (VITAMIN D) 2000 UNITS CAPS Take 1 capsule by mouth at bedtime.     . dofetilide (TIKOSYN) 125 MCG capsule TAKE (1)  CAPSULE BY MOUTH TWICE DAILY. 409 capsule 1  . folic acid (FOLVITE) 1 MG tablet Take 1 mg by mouth 2 (two) times daily.     . furosemide (LASIX) 80 MG tablet Take 1 tablet (80 mg total) by mouth daily. 90 tablet 3  . losartan (COZAAR) 100 MG tablet TAKE ONE TABLET BY MOUTH DAILY. 90 tablet 3  . methocarbamol (ROBAXIN) 500 MG tablet Take 1 tablet (500 mg total) by mouth 3 (three) times daily. 60 tablet 1  . metoprolol succinate (TOPROL-XL) 100 MG 24 hr tablet TAKE ONE TABLET BY MOUTH EVERY MORNING. TAKE WITH OR IMMEDIATELY FOLLOWING A  MEAL. 90 tablet 3  . orphenadrine (NORFLEX) 100 MG tablet Take 1 tablet (100 mg total) by mouth 2 (two) times daily. 30 tablet 1  . Polyethyl Glycol-Propyl Glycol (SYSTANE OP) Place 1 drop into both eyes daily as needed (Dry eyes).    . potassium chloride SA (K-DUR,KLOR-CON) 20 MEQ tablet Take 20 mEq by mouth 3 (three) times daily.    [name] warfarin (COUMADIN) 2.5 MG tablet Take 1-1.5 tablets (2.5-3.75 mg total) by mouth as directed. Take 1 1/2 tablets daily except 1 tablet on Mondays and Thursdays. (Patient taking differently: Take 2.5-3.75 mg by mouth as directed. Take 1 1/2 tablets daily except 1 tablet on Mondays, Wednesday and Friday.) 60 tablet 5   No current facility-administered medications for this visit.    Allergies:  Codeine and Keflex [cephalexin]   Social History: The patient  reports that she has never smoked. She has never used smokeless tobacco. She reports that she does not drink alcohol or use drugs.   ROS:  Please see the history of present illness. Otherwise, complete review of systems is positive for hearing loss, intermittent headache.  All other systems are reviewed and negative.   Physical Exam: VS:  BP 122/62   Pulse 83   Ht 5' (1.524 m)   Wt 168 lb (76.2 kg)   SpO2 96%   BMI 32.81 kg/m , BMI Body mass index is 32.81 kg/m.  Wt Readings from Last 3 Encounters:  02/16/18 168 lb (76.2 kg)  02/16/18 170 lb (77.1 kg)  02/09/18 170 lb (77.1 kg)    General: Elderly woman, appears comfortable at rest. HEENT: Conjunctiva and lids normal, oropharynx clear. Neck: Supple, no elevated JVP or carotid bruits, no thyromegaly. Lungs: Clear to auscultation, nonlabored breathing at rest. Cardiac: Regular rate and rhythm, no S3, 2/6 systolic murmur. Abdomen: Soft, nontender, bowel sounds present. Extremities: Mild ankle edema, distal pulses 2+. Skin: Warm and dry. Musculoskeletal: No kyphosis. Neuropsychiatric: Alert and oriented x3, affect grossly appropriate.  ECG: I  personally reviewed the tracing from 05/04/2017 which showed dual-chamber pacing.  Recent Labwork: 05/04/2017: B Natriuretic Peptide 254.0 05/05/2017: ALT 24; AST 28; Hemoglobin 14.9; Platelets 169 08/27/2017: BUN 33; Creatinine, Ser 1.28; Potassium 4.0; Sodium 140     Component Value Date/Time   CHOL 111 01/24/2013 0946   TRIG 119 01/24/2013 0946   HDL 31 (L) 01/24/2013 0946   CHOLHDL 3.6 01/24/2013 0946   VLDL 24 01/24/2013 0946   LDLCALC 56 01/24/2013 0946    Other Studies Reviewed Today:  Echocardiogram 09/13/2013: Study Conclusions  - Left ventricle: The cavity size was normal. Wall thickness was increased in a pattern of mild LVH. Systolic function was normal. The estimated ejection fraction was in the range of 60% to 65%. Wall motion was normal; there were no regional wall motion abnormalities. Features are consistent with a pseudonormal left ventricular  filling pattern, with concomitant abnormal relaxation and increased filling pressure (grade 2 diastolic dysfunction). Doppler parameters are consistent with elevated ventricular end-diastolic filling pressure. - Aortic valve: Mildly calcified annulus. Trileaflet; mildly calcified leaflets. There was very mild stenosis. There was no significant regurgitation. Mean gradient (S): 9 mm Hg. Valve area (VTI): 1.52 cm^2. Valve area (Vmax): 1.71 cm^2. - Mitral valve: Calcified annulus. Mildly thickened leaflets . There was mild regurgitation. - Left atrium: The atrium was mildly to moderately dilated. - Right ventricle: The cavity size was mildly dilated. Pacer wire or catheter noted in right ventricle. - Right atrium: Central venous pressure (est): 3 mm Hg. - Atrial septum: No defect or patent foramen ovale was identified. - Tricuspid valve: There was mild regurgitation. - Pulmonary arteries: PA peak pressure: 42 mm Hg (S). - Pericardium, extracardiac: There was no pericardial  effusion.  Impressions:  - Mild LVH with LVEF 01-75%, grade 2 diastolic dysfunction with increased filling pressures. MIld to moderate left atrial enlargement. Mild mitral regurgitation. Very mildly stenotic aortic valve. Device wire noted in right heart. Mild tricuspid regurgitation with PASP 42 mmHg.  Lexiscan Cardiolite 06/15/2014: FINDINGS: Pharmacological stress  Baseline EKG shows ventricular pacing. After injection heart rate increased from 61 beats per min up to 75 beats per min, and blood pressure decreased from 139/76 and 113/66. The test was stopped after injection was complete, the patient did not experience any chest pain. Post-injection EKG showed no significant arrhythmias, findings not interpretable for ischemia because she was ventricular paced throughout the study.  Perfusion: No decreased activity in the left ventricle on stress imaging to suggest reversible ischemia or infarction.  Wall Motion: Normal left ventricular wall motion. No left ventricular dilation.  Left Ventricular Ejection Fraction: 77 %  End diastolic volume 55 ml  End systolic volume 12 ml  IMPRESSION: 1. No reversible ischemia or infarction.  2. Normal left ventricular wall motion.  3. Left ventricular ejection fraction 77%  4. Low-risk stress test findings*.  Assessment and Plan:  1.  Paroxysmal atrial fibrillation, approximately 5% rhythm burden based on most recent device interrogation.  She continues on Coumadin for stroke prophylaxis and remains on Tikosyn as well.  I reviewed her last lab work.  2.  Medtronic ICD in place, follows with Dr. Lovena Le.  No device shocks.  3.  CAD with history of CABG and DES to the PLA.  LVEF normal by most recent assessment and no active angina symptoms.  Continue statin therapy.  4.  History of diastolic heart failure, clinically stable on diuretic therapy.  Current medicines were reviewed with the patient  today.  Disposition: Follow-up in 4 months.  Signed, Satira Sark, MD, [facility] 02/16/2018 1:55 PM    [facility] at [facility] [address] [place], [place] [address] Phone: [phone]; Fax: [phone]

## 2018-02-16 ENCOUNTER — Ambulatory Visit: Payer: Medicare Other | Admitting: Orthopedic Surgery

## 2018-02-16 ENCOUNTER — Encounter: Payer: Self-pay | Admitting: Orthopedic Surgery

## 2018-02-16 ENCOUNTER — Ambulatory Visit: Payer: Medicare Other | Admitting: Cardiology

## 2018-02-16 ENCOUNTER — Encounter: Payer: Self-pay | Admitting: Cardiology

## 2018-02-16 VITALS — BP 122/62 | HR 83 | Ht 60.0 in | Wt 168.0 lb

## 2018-02-16 VITALS — BP 123/67 | HR 78 | Ht 60.0 in | Wt 170.0 lb

## 2018-02-16 DIAGNOSIS — I25119 Atherosclerotic heart disease of native coronary artery with unspecified angina pectoris: Secondary | ICD-10-CM | POA: Diagnosis not present

## 2018-02-16 DIAGNOSIS — I5032 Chronic diastolic (congestive) heart failure: Secondary | ICD-10-CM

## 2018-02-16 DIAGNOSIS — Z95810 Presence of automatic (implantable) cardiac defibrillator: Secondary | ICD-10-CM

## 2018-02-16 DIAGNOSIS — M5030 Other cervical disc degeneration, unspecified cervical region: Secondary | ICD-10-CM | POA: Diagnosis not present

## 2018-02-16 DIAGNOSIS — M48061 Spinal stenosis, lumbar region without neurogenic claudication: Secondary | ICD-10-CM

## 2018-02-16 DIAGNOSIS — I480 Paroxysmal atrial fibrillation: Secondary | ICD-10-CM

## 2018-02-16 MED ORDER — ORPHENADRINE CITRATE ER 100 MG PO TB12: 100.0000 mg | ORAL_TABLET | Freq: Two times a day (BID) | ORAL | 1 refills | Status: DC

## 2018-02-16 NOTE — Addendum Note (Signed)
Addended byCandice Camp on: 02/16/2018 10:30 AM   Modules accepted: Orders

## 2018-02-16 NOTE — Patient Instructions (Signed)
Medication Instructions:  Your physician recommends that you continue on your current medications as directed. Please refer to the Current Medication list given to you today.  If you need a refill on your cardiac medications before your next appointment, please call your pharmacy.   Lab work: none If you have labs (blood work) drawn today and your tests are completely normal, you will receive your results only by: [name] MyChart Message (if you have MyChart) OR . A paper copy in the mail If you have any lab test that is abnormal or we need to change your treatment, we will call you to review the results.  Testing/Procedures: none  Follow-Up: At [facility], you and your health needs are our priority.  As part of our continuing mission to provide you with exceptional heart care, we have created designated Provider Care Teams.  These Care Teams include your primary Cardiologist (physician) and Advanced Practice Providers (APPs -  Physician Assistants and Nurse Practitioners) who all work together to provide you with the care you need, when you need it. You will need a follow up appointment in 4 months.  Please call our office 2 months in advance to schedule this appointment.  You may see Rozann Lesches, MD or one of the following Advanced Practice Providers on your designated Care Team:   Bernerd Pho, PA-C [facility]) . Ermalinda Barrios, PA-C ([place])  Any Other Special Instructions Will Be Listed Below (If Applicable). none

## 2018-02-16 NOTE — Progress Notes (Signed)
FOLLOW UP VISIT : CT RESULTS   Chief Complaint  Patient presents with  . Results    CT scan results on C-spine.     HPI: The patient is here TO DISCUSS THE RESULTS OF MRI  HPI 84 year old female with left-sided cervical spine pain radiating to the left arm decreased range of motion trying her on some Robaxin did not improve. CT scan obtained   Review of Systems  Constitutional: Negative for chills, fever, malaise/fatigue and weight loss.  Musculoskeletal: Positive for back pain and joint pain. Negative for falls.       Still having right hip and leg and lower back pain  Neurological: Negative for tingling, sensory change, focal weakness and weakness.     BP 123/67   Pulse 78   Ht 5' (1.524 m)   Wt 170 lb (77.1 kg)   BMI 33.20 kg/m   Improved range of motion cervical spine no tension.  Muscle tone now normal skin remains warm dry intact no lesions or rashes or congenital lesions.  Grip strength is normal in each hand reflexes normal in each arm pulse and perfusion normal bilaterally no sensory deficits  Medical decision-making section   DATA  CT REPORT  Disc levels:   C2-3: Severe left facet arthrosis without stenosis, unchanged.   C3-4: Anterolisthesis with bulging uncovered disc and severe left facet arthrosis result in mild left neural foraminal stenosis without significant spinal stenosis, unchanged.   C4-5: Broad-based posterior disc osteophyte complex and mild facet arthrosis result in borderline right neural foraminal stenosis without evidence of spinal stenosis, unchanged.   C5-6: Mild disc bulging, uncovertebral spurring, and moderate left facet arthrosis without evidence of significant stenosis, unchanged.   C6-7: Mild disc bulging and mild-to-moderate left facet arthrosis without stenosis, unchanged.   C7-T1: Mild facet arthrosis without stenosis, unchanged.   Upper chest: Clear lung apices.   Other: Extensive atherosclerotic calcification about  the carotid bifurcations.   IMPRESSION: Unchanged cervical disc and facet degeneration without evidence of compressive stenosis or acute osseous abnormality.     Electronically Signed   By: Logan Bores M.D.   On: 02/16/2018 10:02   MY READING:  CT scan multilevel degenerative disc disease without focal foraminal stenosis   Encounter Diagnoses  Name Primary?  . Degenerative disc disease, cervical Yes  . Degenerative lumbar spinal stenosis    Patient still has pain in her right hip lower back and right leg would like further intervention that is not surgical.  We recommend physical therapy   PLAN: Continue Norflex 1 week then stop use on a as needed basis after that  Start physical therapy for lumbar spine right hip and leg pain  FU PRN

## 2018-02-16 NOTE — Patient Instructions (Signed)
  Physical therapy has been ordered for you at [facility] [phone] is the phone number to call if you want to call to schedule. Please let us know if you do not hear anything within one week.  

## 2018-02-23 ENCOUNTER — Ambulatory Visit (INDEPENDENT_AMBULATORY_CARE_PROVIDER_SITE_OTHER): Payer: Medicare Other | Admitting: *Deleted

## 2018-02-23 DIAGNOSIS — Z5181 Encounter for therapeutic drug level monitoring: Secondary | ICD-10-CM | POA: Diagnosis not present

## 2018-02-23 DIAGNOSIS — I480 Paroxysmal atrial fibrillation: Secondary | ICD-10-CM | POA: Diagnosis not present

## 2018-02-23 LAB — POCT INR: INR: 2.6 (ref 2.0–3.0)

## 2018-02-23 NOTE — Patient Instructions (Signed)
Continue coumadin 1 1/2  tablets daily except 1 tablet on Mondays, Wednesdays and Fridays  Recheck in 6 weeks 

## 2018-03-03 ENCOUNTER — Other Ambulatory Visit (HOSPITAL_COMMUNITY): Payer: Medicare Other

## 2018-03-26 ENCOUNTER — Emergency Department (HOSPITAL_COMMUNITY): Payer: Medicare Other

## 2018-03-26 ENCOUNTER — Emergency Department (HOSPITAL_COMMUNITY)
Admission: EM | Admit: 2018-03-26 | Discharge: 2018-03-26 | Disposition: A | Discharge status: Home / Self Care | Payer: Medicare Other | Attending: Emergency Medicine | Admitting: Emergency Medicine

## 2018-03-26 ENCOUNTER — Encounter (HOSPITAL_COMMUNITY): Payer: Self-pay | Admitting: Emergency Medicine

## 2018-03-26 ENCOUNTER — Other Ambulatory Visit: Payer: Self-pay

## 2018-03-26 DIAGNOSIS — S61411A Laceration without foreign body of right hand, initial encounter: Secondary | ICD-10-CM | POA: Diagnosis not present

## 2018-03-26 DIAGNOSIS — S0990XA Unspecified injury of head, initial encounter: Secondary | ICD-10-CM | POA: Insufficient documentation

## 2018-03-26 DIAGNOSIS — Y9389 Activity, other specified: Secondary | ICD-10-CM | POA: Diagnosis not present

## 2018-03-26 DIAGNOSIS — Z950 Presence of cardiac pacemaker: Secondary | ICD-10-CM | POA: Diagnosis not present

## 2018-03-26 DIAGNOSIS — I5032 Chronic diastolic (congestive) heart failure: Secondary | ICD-10-CM | POA: Insufficient documentation

## 2018-03-26 DIAGNOSIS — W19XXXA Unspecified fall, initial encounter: Secondary | ICD-10-CM

## 2018-03-26 DIAGNOSIS — Y999 Unspecified external cause status: Secondary | ICD-10-CM | POA: Insufficient documentation

## 2018-03-26 DIAGNOSIS — Z23 Encounter for immunization: Secondary | ICD-10-CM | POA: Diagnosis not present

## 2018-03-26 DIAGNOSIS — I130 Hypertensive heart and chronic kidney disease with heart failure and stage 1 through stage 4 chronic kidney disease, or unspecified chronic kidney disease: Secondary | ICD-10-CM | POA: Insufficient documentation

## 2018-03-26 DIAGNOSIS — I110 Hypertensive heart disease with heart failure: Secondary | ICD-10-CM | POA: Insufficient documentation

## 2018-03-26 DIAGNOSIS — W01198A Fall on same level from slipping, tripping and stumbling with subsequent striking against other object, initial encounter: Secondary | ICD-10-CM | POA: Insufficient documentation

## 2018-03-26 DIAGNOSIS — J449 Chronic obstructive pulmonary disease, unspecified: Secondary | ICD-10-CM | POA: Diagnosis not present

## 2018-03-26 DIAGNOSIS — Y92008 Other place in unspecified non-institutional (private) residence as the place of occurrence of the external cause: Secondary | ICD-10-CM | POA: Insufficient documentation

## 2018-03-26 DIAGNOSIS — I4891 Unspecified atrial fibrillation: Secondary | ICD-10-CM | POA: Diagnosis not present

## 2018-03-26 DIAGNOSIS — N183 Chronic kidney disease, stage 3 (moderate): Secondary | ICD-10-CM | POA: Insufficient documentation

## 2018-03-26 HISTORY — DX: Other intervertebral disc degeneration, lumbar region without mention of lumbar back pain or lower extremity pain: M51.369

## 2018-03-26 HISTORY — DX: Other intervertebral disc degeneration, lumbar region: M51.36

## 2018-03-26 HISTORY — DX: Dorsalgia, unspecified: M54.9

## 2018-03-26 HISTORY — DX: Chronic obstructive pulmonary disease, unspecified: J44.9

## 2018-03-26 HISTORY — DX: Other chronic pain: G89.29

## 2018-03-26 MED ORDER — LIDOCAINE HCL 2 % IJ SOLN
10.0000 mL | Freq: Once | INTRAMUSCULAR | Status: DC
  Filled 2018-03-26: qty 10

## 2018-03-26 MED ORDER — LIDOCAINE HCL (PF) 2 % IJ SOLN
INTRAMUSCULAR | Status: AC
  Filled 2018-03-26: qty 10

## 2018-03-26 MED ORDER — TETANUS-DIPHTH-ACELL PERTUSSIS 5-2.5-18.5 LF-MCG/0.5 IM SUSP
0.5000 mL | Freq: Once | INTRAMUSCULAR | Status: AC
  Administered 2018-03-26: 0.5 mL via INTRAMUSCULAR
  Filled 2018-03-26: qty 0.5

## 2018-03-26 NOTE — Discharge Instructions (Addendum)
CT head was normal.  Elevate hand.  Tylenol for pain.  Keep initial dressing on for 48 hours.  Then can change daily.  Suture out in 10 days.  Tetanus updated today

## 2018-03-26 NOTE — ED Triage Notes (Addendum)
Patient fall off steps backwards hitting head on concrete. Patient does take coumadin. Denies any LOC, dizziness, or blurred vision. Patient has bulky gauze dressing to right hand. Per patient lacerations to hand from fall. Unsure of last tetanus vaccination. Active bleeding noted. Patient c/o right shoulder pain and lower back pain.

## 2018-03-27 NOTE — ED Provider Notes (Addendum)
[facility] EMERGENCY DEPARTMENT Provider Note   CSN: [id number] Arrival date & time: 03/26/18  1619     History   Chief Complaint Chief Complaint  Patient presents with  . Fall    HPI [name] [name] is a 84 y.o. female.  Level 5 caveat for acuity of condition.  Accidental trip and fall on her deck hitting the back of her head on concrete.  Additionally she has lacerations on her right hand.  No other obvious injuries.  Last tetanus unknown.  Complains of right shoulder and lower back pain.  She is on Coumadin for atrial fibrillation     Past Medical History:  Diagnosis Date  . Atrial fibrillation ([place])    a. anticoagulated with Warfarin  . CHF (congestive heart failure) ([place])   . Chronic back pain   . Chronic diastolic heart failure ([place])   . Complete heart block ([place])   . COPD (chronic obstructive pulmonary disease) ([place])   . Coronary atherosclerosis of native coronary artery    Multivessel status post CABG, DES PLA March 2006  . DDD (degenerative disc disease), lumbar   . Essential hypertension, benign   . Mixed hyperlipidemia   . Osteoarthritis   . Ventricular fibrillation ([place]) 2003   a. seen on PPM interrogation a/w syncope    Patient Active Problem List   Diagnosis Date Noted  . Degenerative disc disease, cervical 02/09/2018  . Acute respiratory failure with hypoxia ([place]) 05/07/2017  . Chronic kidney disease, stage III (moderate) ([place]) 05/07/2017  . Chronic obstructive asthma with acute exacerbation ([place]) 05/05/2017  . COPD exacerbation ([place]) 05/04/2017  . Pacemaker complications 37/16/9678  . CAP (community acquired pneumonia) 07/19/2013  . Shortness of breath 07/19/2013  . Encounter for therapeutic drug monitoring 06/12/2013  . Chronic diastolic heart failure ([place]) 04/11/2013  . Aortic stenosis 10/18/2012  . Encounter for long-term (current) use of anticoagulants 10/08/2011  . Atrial fibrillation ([place]) 09/21/2011  . Mixed hyperlipidemia 08/25/2008  .  Essential hypertension, benign 08/25/2008  . Coronary atherosclerosis of native coronary artery 08/25/2008  . VENTRICULAR TACHYCARDIA 08/25/2008  . Automatic implantable cardioverter-defibrillator in situ 08/25/2008    Past Surgical History:  Procedure Laterality Date  . CARDIAC DEFIBRILLATOR PLACEMENT     MDT dual chamber ICD  . CARDIOVERSION N/A 08/09/2014   Procedure: CARDIOVERSION;  Surgeon: Sanda Klein, MD;  Location: [facility] ENDOSCOPY;  Service: Cardiovascular;  Laterality: N/A;  . CORONARY ARTERY BYPASS GRAFT     LIMA to LAD, SVG to diagonal, SVG to ramus and OM  . IMPLANTABLE CARDIOVERTER DEFIBRILLATOR GENERATOR CHANGE N/A 09/25/2013   Procedure: IMPLANTABLE CARDIOVERTER DEFIBRILLATOR GENERATOR CHANGE;  Surgeon: Evans Lance, MD;  Location: [facility] CATH LAB;  Service: Cardiovascular;  Laterality: N/A;  . LEAD REVISION N/A 09/29/2013   Procedure: LEAD REVISION;  Surgeon: Deboraha Sprang, MD;  Location: [facility] CATH LAB;  Service: Cardiovascular;  Laterality: N/A;  . TONSILLECTOMY    . YAG LASER APPLICATION Left 12/21/8099   Procedure: YAG LASER APPLICATION;  Surgeon: Elta Guadeloupe T. Gershon Crane, MD;  Location: [facility] ORS;  Service: Ophthalmology;  Laterality: Left;  . YAG LASER APPLICATION Right 7/51/0258   Procedure: YAG LASER APPLICATION;  Surgeon: Elta Guadeloupe T. Gershon Crane, MD;  Location: [facility] ORS;  Service: Ophthalmology;  Laterality: Right;     OB History    Gravida  2   Para  2   Term  2   Preterm      AB      Living  2  SAB      TAB      Ectopic      Multiple      Live Births               Home Medications    Prior to Admission medications   Medication Sig Start Date End Date Taking? Authorizing Provider  acetaminophen (TYLENOL) 500 MG tablet Take 500 mg by mouth every morning.    Yes [provider]  albuterol (PROVENTIL HFA;VENTOLIN HFA) 108 (90 BASE) MCG/ACT inhaler Inhale 2 puffs into the lungs every 6 (six) hours as needed for wheezing or shortness of breath. 07/05/14   Yes Lendon Colonel, NP  amLODipine (NORVASC) 5 MG tablet TAKE (1) TABLET BY MOUTH ONCE DAILY. Patient taking differently: Take 5 mg by mouth daily.  04/16/17  Yes Satira Sark, MD  atorvastatin (LIPITOR) 20 MG tablet TAKE ONE TABLET BY MOUTH ONCE DAILY. Patient taking differently: Take 20 mg by mouth daily.  01/24/18  Yes Satira Sark, MD  Calcium Carbonate-Vitamin D (CALCIUM 600 + D PO) Take 1 tablet by mouth 2 (two) times daily.   Yes [provider]  Cholecalciferol (VITAMIN D) 2000 UNITS CAPS Take 1 capsule by mouth at bedtime.    Yes [provider]  dofetilide (TIKOSYN) 125 MCG capsule TAKE (1) CAPSULE BY MOUTH TWICE DAILY. Patient taking differently: Take 125 mcg by mouth 2 (two) times daily.  11/09/17  Yes Evans Lance, MD  folic acid (FOLVITE) 1 MG tablet Take 1 mg by mouth 2 (two) times daily.    Yes [provider]  furosemide (LASIX) 80 MG tablet Take 1 tablet (80 mg total) by mouth daily. 08/12/17  Yes Evans Lance, MD  losartan (COZAAR) 100 MG tablet TAKE ONE TABLET BY MOUTH DAILY. Patient taking differently: Take 100 mg by mouth daily.  04/16/17  Yes Satira Sark, MD  metoprolol succinate (TOPROL-XL) 100 MG 24 hr tablet TAKE ONE TABLET BY MOUTH EVERY MORNING. TAKE WITH OR IMMEDIATELY FOLLOWING A MEAL. Patient taking differently: Take 100 mg by mouth every morning.  04/16/17  Yes Satira Sark, MD  Polyethyl Glycol-Propyl Glycol (SYSTANE OP) Place 1 drop into both eyes daily as needed (Dry eyes).   Yes [provider]  potassium chloride SA (K-DUR,KLOR-CON) 20 MEQ tablet Take 20 mEq by mouth 3 (three) times daily.   Yes [provider]  warfarin (COUMADIN) 2.5 MG tablet Take 1-1.5 tablets (2.5-3.75 mg total) by mouth as directed. Take 1 1/2 tablets daily except 1 tablet on Mondays and Thursdays. Patient taking differently: Take 2.5-3.75 mg by mouth as directed. Take 3.75mg  daily except 2.5mg  on Mondays,  Wednesday and Friday. 05/07/17  Yes Sinda Du, MD  methocarbamol (ROBAXIN) 500 MG tablet Take 1 tablet (500 mg total) by mouth 3 (three) times daily. Patient not taking: Reported on 03/26/2018 01/26/18   Carole Civil, MD  orphenadrine (NORFLEX) 100 MG tablet Take 1 tablet (100 mg total) by mouth 2 (two) times daily. Patient not taking: Reported on 03/26/2018 02/16/18   Carole Civil, MD    Family History Family History  Problem Relation Age of Onset  . Heart attack Father   . Heart attack Brother     Social History Social History   Tobacco Use  . Smoking status: Never Smoker  . Smokeless tobacco: Never Used  Substance Use Topics  . Alcohol use: No    Alcohol/week: 0.0 standard drinks  . Drug use:  No     Allergies   Codeine and Keflex [cephalexin]   Review of Systems Review of Systems  Unable to perform ROS: Acuity of condition     Physical Exam Updated Vital Signs BP (!) 136/56   Pulse 68   Resp (S) (!) 26   Ht 5' (1.524 m)   Wt 77.1 kg   SpO2 95%   BMI 33.20 kg/m   Physical Exam  Constitutional: She is oriented to person, place, and time. She appears well-developed and well-nourished.  HENT:  Head: Normocephalic.  Tender occipital area with hematoma; no laceration  Eyes: Conjunctivae are normal.  Neck: Neck supple.  Nontender  Cardiovascular: Normal rate and regular rhythm.  Pulmonary/Chest: Effort normal and breath sounds normal.  Abdominal: Soft. Bowel sounds are normal.  Musculoskeletal: Normal range of motion.  Full range of motion of right shoulder  Neurological: She is alert and oriented to person, place, and time.  Psychiatric: She has a normal mood and affect. Her behavior is normal.  Nursing note and vitals reviewed.    ED Treatments / Results  Labs (all labs ordered are listed, but only abnormal results are displayed) Labs Reviewed - No data to display  EKG None  Radiology Ct Head Wo Contrast  Result Date:  03/26/2018 CLINICAL DATA:  [place]. Pt fell off steps backwards, hitting the back of her head. Pt is on blood thinner. Head trauma, minor, GCS>=13, high clinical risk, initial exam Fall striking occipital area; on Coumadin EXAM: CT HEAD WITHOUT CONTRAST TECHNIQUE: Contiguous axial images were obtained from the base of the skull through the vertex without intravenous contrast. COMPARISON:  Head CT 04/30/2017 FINDINGS: Brain: Scalp hematoma over the posterior vertex. Associated skull fracture. No intracranial hemorrhage. No acute intracranial hemorrhage. No focal mass lesion. No CT evidence of acute infarction. No midline shift or mass effect. No hydrocephalus. Basilar cisterns are patent. There are periventricular and subcortical white matter hypodensities. Generalized cortical atrophy. Vascular: No hyperdense vessel or unexpected calcification. Skull: Normal. Negative for fracture or focal lesion. Sinuses/Orbits: Paranasal sinuses and mastoid air cells are clear. Orbits are clear. Other: None. IMPRESSION: 1. Large scalp hematoma over the posterior vertex. 2. No skull fracture. 3. No intracranial trauma.  14 Electronically Signed   By: Suzy Bouchard M.D.   On: 03/26/2018 19:53    Procedures .Marland KitchenLaceration Repair Date/Time: 03/27/2018 1:17 PM Performed by: Nat Christen, MD Authorized by: Nat Christen, MD   Consent:    Consent obtained:  Verbal   Consent given by:  Patient   Risks discussed:  Pain Anesthesia (see MAR for exact dosages):    Anesthesia method:  Nerve block   Block location:  Base of right 5th digit   Block anesthetic:  Lidocaine 2% w/o epi   Block technique:  Digit nerve block   Block injection procedure:  Anatomic landmarks identified and introduced needle Laceration details:    Location:  Finger   Finger location:  R small finger   Wound length (cm): 2.0. Repair type:    Repair type:  Simple Pre-procedure details:    Preparation:  Patient was prepped and draped in usual sterile  fashion Exploration:    Hemostasis achieved with:  Direct pressure   Contaminated: no   Treatment:    Area cleansed with:  Saline   Amount of cleaning:  Standard   Irrigation solution:  Sterile saline   Irrigation method:  Syringe   Visualized foreign bodies/material removed: no   Skin repair:  Repair method:  Sutures   Suture size:  5-0   Suture technique:  Simple interrupted   Number of sutures: 3. Approximation:    Approximation:  Loose Post-procedure details:    Dressing:  Bulky dressing   Patient tolerance of procedure:  Tolerated well, no immediate complications  .Marland KitchenLaceration Repair Date/Time: 03/27/2018 1:22 PM Performed by: Nat Christen, MD Authorized by: Nat Christen, MD   Consent:    Consent obtained:  Verbal   Consent given by:  Patient   Risks discussed:  Pain Anesthesia (see MAR for exact dosages):    Anesthesia method:  Local infiltration Laceration details:    Location:  Hand   Hand location:  R hand, dorsum   Wound length (cm): 2.0. Repair type:    Repair type:  Simple Pre-procedure details:    Preparation:  Patient was prepped and draped in usual sterile fashion Exploration:    Hemostasis achieved with:  Direct pressure   Contaminated: no   Treatment:    Area cleansed with:  Saline   Amount of cleaning:  Standard   Irrigation solution:  Sterile saline   Irrigation method:  Syringe   Visualized foreign bodies/material removed: no   Skin repair:    Repair method:  Sutures   Suture size:  4-0   Suture material:  Prolene   Suture technique:  Simple interrupted   Number of sutures: 3. Approximation:    Approximation:  Loose Post-procedure details:    Dressing:  Bulky dressing   Patient tolerance of procedure:  Tolerated well, no immediate complications   (including critical care time)  Medications Ordered in ED Medications  Tdap (BOOSTRIX) injection 0.5 mL (0.5 mLs Intramuscular Given 03/26/18 2022)     Initial Impression / Assessment and  Plan / ED Course  I have reviewed the triage vital signs and the nursing notes.  Pertinent labs & imaging results that were available during my care of the patient were reviewed by me and considered in my medical decision making (see chart for details).     Status post recent accidental fall with associated head trauma.  CT head negative.  Patient has two [2] suturable lacerations on the right hand.  Digital block of fifth digit.  Repair per procedure note.  Patient was neurologically intact at discharge.  Final Clinical Impressions(s) / ED Diagnoses   Final diagnoses:  Fall, initial encounter  Laceration of right hand without foreign body, initial encounter    ED Discharge Orders    None       Nat Christen, MD 03/27/18 1321    Nat Christen, MD 03/27/18 1324

## 2018-03-31 ENCOUNTER — Ambulatory Visit (HOSPITAL_COMMUNITY)
Admission: RE | Admit: 2018-03-31 | Discharge: 2018-03-31 | Disposition: A | Discharge status: Home / Self Care | Payer: Medicare Other | Source: Ambulatory Visit | Attending: Pulmonary Disease | Admitting: Pulmonary Disease

## 2018-03-31 ENCOUNTER — Other Ambulatory Visit (HOSPITAL_COMMUNITY): Payer: Self-pay | Admitting: Pulmonary Disease

## 2018-03-31 DIAGNOSIS — R05 Cough: Secondary | ICD-10-CM | POA: Insufficient documentation

## 2018-03-31 DIAGNOSIS — R059 Cough, unspecified: Secondary | ICD-10-CM

## 2018-04-06 ENCOUNTER — Ambulatory Visit (INDEPENDENT_AMBULATORY_CARE_PROVIDER_SITE_OTHER): Payer: Medicare Other | Admitting: *Deleted

## 2018-04-06 DIAGNOSIS — I480 Paroxysmal atrial fibrillation: Secondary | ICD-10-CM

## 2018-04-06 DIAGNOSIS — Z5181 Encounter for therapeutic drug level monitoring: Secondary | ICD-10-CM | POA: Diagnosis not present

## 2018-04-06 DIAGNOSIS — I4891 Unspecified atrial fibrillation: Secondary | ICD-10-CM | POA: Diagnosis not present

## 2018-04-06 LAB — POCT INR: INR: 4.8 — AB (ref 2.0–3.0)

## 2018-04-06 NOTE — Patient Instructions (Signed)
Hold coumadin tonight and tomorrow night then resume 1 1/2 tablets daily except 1 tablet on Mondays, Wednesdays and Fridays Recheck in 3 weeks

## 2018-04-18 ENCOUNTER — Ambulatory Visit (INDEPENDENT_AMBULATORY_CARE_PROVIDER_SITE_OTHER): Payer: Medicare Other

## 2018-04-18 DIAGNOSIS — I472 Ventricular tachycardia: Secondary | ICD-10-CM

## 2018-04-18 DIAGNOSIS — I5032 Chronic diastolic (congestive) heart failure: Secondary | ICD-10-CM

## 2018-04-18 DIAGNOSIS — I4729 Other ventricular tachycardia: Secondary | ICD-10-CM

## 2018-04-18 NOTE — Progress Notes (Signed)
Remote ICD transmission.   

## 2018-04-19 ENCOUNTER — Other Ambulatory Visit: Payer: Self-pay | Admitting: Cardiology

## 2018-04-19 LAB — CUP PACEART REMOTE DEVICE CHECK
Battery Remaining Longevity: 30 mo
Battery Voltage: 2.94 V
Brady Statistic AP VS Percent: 0 %
Brady Statistic AS VS Percent: 0.08 %
Brady Statistic RA Percent Paced: 59.41 %
Brady Statistic RV Percent Paced: 99.78 %
Date Time Interrogation Session: 20191230141608
HighPow Impedance: 48 Ohm
HighPow Impedance: 64 Ohm
Implantable Lead Implant Date: 20030823
Implantable Lead Location: 753859
Implantable Lead Location: 753860
Implantable Lead Model: 157
Implantable Lead Serial Number: 108215
Implantable Lead Serial Number: 112190
Implantable Pulse Generator Implant Date: 20150608
Lead Channel Impedance Value: 304 Ohm
Lead Channel Impedance Value: 304 Ohm
Lead Channel Impedance Value: 418 Ohm
Lead Channel Pacing Threshold Amplitude: 0.625 V
Lead Channel Pacing Threshold Amplitude: 1.375 V
Lead Channel Pacing Threshold Pulse Width: 0.4 ms
Lead Channel Sensing Intrinsic Amplitude: 10.125 mV
Lead Channel Sensing Intrinsic Amplitude: 3.375 mV
Lead Channel Setting Pacing Amplitude: 2 V
Lead Channel Setting Pacing Amplitude: 3 V
Lead Channel Setting Pacing Pulse Width: 0.4 ms
Lead Channel Setting Sensing Sensitivity: 0.3 mV
MDC IDC LEAD IMPLANT DT: 20030823
MDC IDC MSMT LEADCHNL RA SENSING INTR AMPL: 3.375 mV
MDC IDC MSMT LEADCHNL RV PACING THRESHOLD PULSEWIDTH: 0.4 ms
MDC IDC MSMT LEADCHNL RV SENSING INTR AMPL: 10.125 mV
MDC IDC STAT BRADY AP VP PERCENT: 60.04 %
MDC IDC STAT BRADY AS VP PERCENT: 39.88 %

## 2018-04-21 ENCOUNTER — Other Ambulatory Visit: Payer: Self-pay | Admitting: *Deleted

## 2018-04-21 MED ORDER — AMLODIPINE BESYLATE 5 MG PO TABS: 5.0000 mg | ORAL_TABLET | Freq: Every day | ORAL | 3 refills | Status: DC

## 2018-04-25 ENCOUNTER — Other Ambulatory Visit: Payer: Self-pay | Admitting: Cardiology

## 2018-04-27 ENCOUNTER — Ambulatory Visit (INDEPENDENT_AMBULATORY_CARE_PROVIDER_SITE_OTHER): Payer: Medicare Other | Admitting: Pharmacist

## 2018-04-27 DIAGNOSIS — Z5181 Encounter for therapeutic drug level monitoring: Secondary | ICD-10-CM | POA: Diagnosis not present

## 2018-04-27 DIAGNOSIS — I480 Paroxysmal atrial fibrillation: Secondary | ICD-10-CM | POA: Diagnosis not present

## 2018-04-27 LAB — POCT INR: INR: 4 — AB (ref 2.0–3.0)

## 2018-04-27 NOTE — Patient Instructions (Signed)
Description   Hold coumadin tonight, tomorrow night take only 1 tablet then resume 1 1/2 tablets daily except 1 tablet on Mondays, Wednesdays and Fridays Recheck in 2 weeks

## 2018-05-10 ENCOUNTER — Other Ambulatory Visit: Payer: Self-pay | Admitting: Internal Medicine

## 2018-05-11 ENCOUNTER — Ambulatory Visit (INDEPENDENT_AMBULATORY_CARE_PROVIDER_SITE_OTHER): Payer: Medicare Other | Admitting: Pharmacist

## 2018-05-11 DIAGNOSIS — I480 Paroxysmal atrial fibrillation: Secondary | ICD-10-CM | POA: Diagnosis not present

## 2018-05-11 DIAGNOSIS — Z5181 Encounter for therapeutic drug level monitoring: Secondary | ICD-10-CM | POA: Diagnosis not present

## 2018-05-11 LAB — POCT INR: INR: 2.7 (ref 2.0–3.0)

## 2018-05-11 NOTE — Telephone Encounter (Signed)
This is Dr. McDowell pt. 

## 2018-05-11 NOTE — Patient Instructions (Signed)
Description   Continue 1 1/2 tablets daily except 1 tablet on Mondays, Wednesdays and Fridays Recheck in 3 weeks

## 2018-05-13 ENCOUNTER — Other Ambulatory Visit: Payer: Self-pay | Admitting: Internal Medicine

## 2018-06-01 ENCOUNTER — Ambulatory Visit (INDEPENDENT_AMBULATORY_CARE_PROVIDER_SITE_OTHER): Payer: Medicare Other | Admitting: Pharmacist

## 2018-06-01 DIAGNOSIS — I480 Paroxysmal atrial fibrillation: Secondary | ICD-10-CM | POA: Diagnosis not present

## 2018-06-01 DIAGNOSIS — Z5181 Encounter for therapeutic drug level monitoring: Secondary | ICD-10-CM | POA: Diagnosis not present

## 2018-06-01 LAB — POCT INR: INR: 2.1 (ref 2.0–3.0)

## 2018-06-01 NOTE — Patient Instructions (Signed)
Description   Continue 1 1/2 tablets daily except 1 tablet on Mondays, Wednesdays and Fridays Recheck in 4 weeks

## 2018-06-21 NOTE — Progress Notes (Signed)
Cardiology Office Note  Date: 06/22/2018   ID: Natisha, Trzcinski [birth year], MRN [id number]  PCP: Sinda Du, MD  Primary Cardiologist: Rozann Lesches, MD   Chief Complaint  Patient presents with  . Atrial Fibrillation    History of Present Illness: HARNEET NOBLETT is an 85 y.o. female last seen in October 2019. She presents for a routine visit. She tells me that she has been more short of breath in the last few months, also having trouble keeping her leg swelling controlled (although has not been completely consistent with her Lasix). She does not report any palpitations or chest pain.  She continues on Coumadin with follow-up in the anticoagulation clinic. Last INR was 2.1 in February and 3.6 today. She has had no spontaneous bleeding problems.  She sees Dr. Lovena Le the device clinic, Medtronic ICD in place.  Last device interrogation demonstrated 32% AF burden and persisting atrial flutter as of December 2019 with controlled heart rate. I personally reviewed her ECG today which shows a ventricular paced rhythm with underlying atrial fibrillation.  I talked with her today about considering a cardioversion as I suspect she has had persistent atrial arrhythmia contributing to her shortness of breath and diastolic heart failure. This was the case in 2015 when she was placed on Tikosyn and underwent cardioversion. Generally rhythm control has been good since then.  Past Medical History:  Diagnosis Date  . Atrial fibrillation ([place])    a. anticoagulated with Warfarin  . CHF (congestive heart failure) ([place])   . Chronic back pain   . Chronic diastolic heart failure ([place])   . Complete heart block ([place])   . COPD (chronic obstructive pulmonary disease) ([place])   . Coronary atherosclerosis of native coronary artery    Multivessel status post CABG, DES PLA March 2006  . DDD (degenerative disc disease), lumbar   . Essential hypertension, benign   . Mixed hyperlipidemia   . Osteoarthritis    . Ventricular fibrillation ([place]) 2003   a. seen on PPM interrogation a/w syncope    Past Surgical History:  Procedure Laterality Date  . CARDIAC DEFIBRILLATOR PLACEMENT     MDT dual chamber ICD  . CARDIOVERSION N/A 08/09/2014   Procedure: CARDIOVERSION;  Surgeon: Sanda Klein, MD;  [facility];  Service: Cardiovascular;  Laterality: N/A;  . CORONARY ARTERY BYPASS GRAFT     LIMA to LAD, SVG to diagonal, SVG to ramus and OM  . IMPLANTABLE CARDIOVERTER DEFIBRILLATOR GENERATOR CHANGE N/A 09/25/2013   Procedure: IMPLANTABLE CARDIOVERTER DEFIBRILLATOR GENERATOR CHANGE;  Surgeon: Evans Lance, MD;  Location: [facility] CATH LAB;  Service: Cardiovascular;  Laterality: N/A;  . LEAD REVISION N/A 09/29/2013   Procedure: LEAD REVISION;  Surgeon: Deboraha Sprang, MD;  Location: [facility] CATH LAB;  Service: Cardiovascular;  Laterality: N/A;  . TONSILLECTOMY    . YAG LASER APPLICATION Left 06/18/4968   Procedure: YAG LASER APPLICATION;  Surgeon: Elta Guadeloupe T. Gershon Crane, MD;  Location: [facility];  Service: Ophthalmology;  Laterality: Left;  . YAG LASER APPLICATION Right 2/63/7858   Procedure: YAG LASER APPLICATION;  Surgeon: Elta Guadeloupe T. Gershon Crane, MD;  Location: [facility];  Service: Ophthalmology;  Laterality: Right;    Current Outpatient Medications  Medication Sig Dispense Refill  . acetaminophen (TYLENOL) 500 MG tablet Take 500 mg by mouth every morning.     [name] albuterol (PROVENTIL HFA;VENTOLIN HFA) 108 (90 BASE) MCG/ACT inhaler Inhale 2 puffs into the lungs every 6 (six) hours as needed for wheezing or shortness of  breath. 1 Inhaler 2  . amLODipine (NORVASC) 5 MG tablet Take 1 tablet (5 mg total) by mouth daily. 90 tablet 3  . atorvastatin (LIPITOR) 20 MG tablet TAKE ONE TABLET BY MOUTH ONCE DAILY. (Patient taking differently: Take 20 mg by mouth daily. ) 90 tablet 3  . Calcium Carbonate-Vitamin D (CALCIUM 600 + D PO) Take 1 tablet by mouth 2 (two) times daily.    . Cholecalciferol (VITAMIN D) 2000 UNITS CAPS Take 1  capsule by mouth at bedtime.     . dofetilide (TIKOSYN) 125 MCG capsule TAKE (1) CAPSULE BY MOUTH TWICE DAILY. 638 capsule 0  . folic acid (FOLVITE) 1 MG tablet Take 1 mg by mouth 2 (two) times daily.     . furosemide (LASIX) 80 MG tablet Take 1 tablet (80 mg total) by mouth daily. 90 tablet 3  . losartan (COZAAR) 100 MG tablet TAKE ONE TABLET BY MOUTH DAILY. 90 tablet 3  . metoprolol succinate (TOPROL-XL) 100 MG 24 hr tablet TAKE ONE TABLET BY MOUTH EVERY MORNING. TAKE WITH OR IMMEDIATELY FOLLOWING A MEAL. 90 tablet 3  . orphenadrine (NORFLEX) 100 MG tablet Take 1 tablet (100 mg total) by mouth 2 (two) times daily. 30 tablet 1  . Polyethyl Glycol-Propyl Glycol (SYSTANE OP) Place 1 drop into both eyes daily as needed (Dry eyes).    . potassium chloride SA (K-DUR,KLOR-CON) 20 MEQ tablet Take 20 mEq by mouth 3 (three) times daily.    [name] warfarin (COUMADIN) 2.5 MG tablet Take 1-1.5 tablets (2.5-3.75 mg total) by mouth as directed. Take 1 1/2 tablets daily except 1 tablet on Mondays and Thursdays. (Patient taking differently: Take 2.5-3.75 mg by mouth as directed. Take 3.75mg  daily except 2.5mg  on Mondays, Wednesday and Friday.) 60 tablet 5   No current facility-administered medications for this visit.    Allergies:  Codeine and Keflex [cephalexin]   Social History: The patient  reports that she has never smoked. She has never used smokeless tobacco. She reports that she does not drink alcohol or use drugs.   Family History: The patient's family history includes Heart attack in her brother and father.    ROS:  Please see the history of present illness. Otherwise, complete review of systems is positive for fatigue. All other systems are reviewed and negative.   Physical Exam: VS:  BP 134/68   Pulse 67   Ht 5' (1.524 m)   Wt 177 lb (80.3 kg)   SpO2 97%   BMI 34.57 kg/m , BMI Body mass index is 34.57 kg/m.  Wt Readings from Last 3 Encounters:  06/22/18 177 lb (80.3 kg)  03/26/18 170 lb (77.1  kg)  02/16/18 168 lb (76.2 kg)    General: Elderly woman in no distress. HEENT: Conjunctiva and lids normal, oropharynx clear. Neck: Supple, no elevated JVP or carotid bruits, no thyromegaly. Lungs: Decreased breath sounds at the bases, nonlabored breathing at rest. Cardiac: Regular rate and rhythm, no S3, 2/6 systolic murmur, no pericardial rub. Abdomen: Soft, nontender, bowel sounds present. Extremities: Chronic appearing leg edema and venous stasis, distal pulses 1+. Skin: Warm and dry. Musculoskeletal: No kyphosis. Neuropsychiatric: Alert and oriented x3, affect grossly appropriate.  ECG: I personally reviewed the tracing from 05/04/2017 which showed dual-chamber pacing with intermittent atrial sensing.  Recent Labwork: 08/27/2017: BUN 33; Creatinine, Ser 1.28; Potassium 4.0; Sodium 140     Component Value Date/Time   CHOL 111 01/24/2013 0946   TRIG 119 01/24/2013 0946   HDL 31 (L) 01/24/2013  0946   CHOLHDL 3.6 01/24/2013 0946   VLDL 24 01/24/2013 0946   LDLCALC 56 01/24/2013 0946    Other Studies Reviewed Today:  Echocardiogram 09/13/2013: Study Conclusions  - Left ventricle: The cavity size was normal. Wall thickness was increased in a pattern of mild LVH. Systolic function was normal. The estimated ejection fraction was in the range of 60% to 65%. Wall motion was normal; there were no regional wall motion abnormalities. Features are consistent with a pseudonormal left ventricular filling pattern, with concomitant abnormal relaxation and increased filling pressure (grade 2 diastolic dysfunction). Doppler parameters are consistent with elevated ventricular end-diastolic filling pressure. - Aortic valve: Mildly calcified annulus. Trileaflet; mildly calcified leaflets. There was very mild stenosis. There was no significant regurgitation. Mean gradient (S): 9 mm Hg. Valve area (VTI): 1.52 cm^2. Valve area (Vmax): 1.71 cm^2. - Mitral valve: Calcified  annulus. Mildly thickened leaflets . There was mild regurgitation. - Left atrium: The atrium was mildly to moderately dilated. - Right ventricle: The cavity size was mildly dilated. Pacer wire or catheter noted in right ventricle. - Right atrium: Central venous pressure (est): 3 mm Hg. - Atrial septum: No defect or patent foramen ovale was identified. - Tricuspid valve: There was mild regurgitation. - Pulmonary arteries: PA peak pressure: 42 mm Hg (S). - Pericardium, extracardiac: There was no pericardial effusion.  Impressions:  - Mild LVH with LVEF 65-99%, grade 2 diastolic dysfunction with increased filling pressures. MIld to moderate left atrial enlargement. Mild mitral regurgitation. Very mildly stenotic aortic valve. Device wire noted in right heart. Mild tricuspid regurgitation with PASP 42 mmHg.  Lexiscan Cardiolite 06/15/2014: FINDINGS: Pharmacological stress  Baseline EKG shows ventricular pacing. After injection heart rate increased from 61 beats per min up to 75 beats per min, and blood pressure decreased from 139/76 and 113/66. The test was stopped after injection was complete, the patient did not experience any chest pain. Post-injection EKG showed no significant arrhythmias, findings not interpretable for ischemia because she was ventricular paced throughout the study.  Perfusion: No decreased activity in the left ventricle on stress imaging to suggest reversible ischemia or infarction.  Wall Motion: Normal left ventricular wall motion. No left ventricular dilation.  Left Ventricular Ejection Fraction: 77 %  End diastolic volume 55 ml  End systolic volume 12 ml  IMPRESSION: 1. No reversible ischemia or infarction.  2. Normal left ventricular wall motion.  3. Left ventricular ejection fraction 77%  4. Low-risk stress test findings*.  Assessment and Plan:   1. Persistent atrial fibrillation. Suspect that this is contributing to  shortness of breath and decompensated diastolic heart failure (increased leg edema and weight gain). She is on Coumadin, Toprol XL, and Tikosyn as before. I have recommended that she consider a cardioversion to see if getting back in sinus rhythm will help with symptoms (as it did in 2015 with initiation of Tikosyn and cardioversion at that time). She would like to get Dr. Tanna Furry opinion first. Suggest device interrogation to confirm persistent arrhythmia and will ask Dr. Lovena Le to review as well. Otherwise reinforced regular use of Lasix.  2. Medtronic ICD in place, follows with Dr. Lovena Le. No device shocks or syncope.  3. Paroxysmal to persistent atrial fibrillation and flutter.  4. CAD status post CABG and DES to the PLA. LVEF 60-65% by last assessment. No active angina on medical therapy.    Current medicines were reviewed with the patient today.   Orders Placed This Encounter  Procedures  . EKG 12-Lead  Disposition: Review with Dr. Lovena Le and discuss cardioversion.  Signed, Satira Sark, MD, [facility] 06/22/2018 11:53 AM    [place] [facility] [address], [place], [place] [address] Phone: [phone]; Fax: [phone]

## 2018-06-22 ENCOUNTER — Ambulatory Visit (INDEPENDENT_AMBULATORY_CARE_PROVIDER_SITE_OTHER): Payer: Medicare Other | Admitting: *Deleted

## 2018-06-22 ENCOUNTER — Encounter: Payer: Self-pay | Admitting: Cardiology

## 2018-06-22 ENCOUNTER — Ambulatory Visit (INDEPENDENT_AMBULATORY_CARE_PROVIDER_SITE_OTHER): Payer: Medicare Other | Admitting: Cardiology

## 2018-06-22 VITALS — BP 134/68 | HR 67 | Ht 60.0 in | Wt 177.0 lb

## 2018-06-22 DIAGNOSIS — I25119 Atherosclerotic heart disease of native coronary artery with unspecified angina pectoris: Secondary | ICD-10-CM | POA: Diagnosis not present

## 2018-06-22 DIAGNOSIS — I4819 Other persistent atrial fibrillation: Secondary | ICD-10-CM

## 2018-06-22 DIAGNOSIS — Z95810 Presence of automatic (implantable) cardiac defibrillator: Secondary | ICD-10-CM

## 2018-06-22 DIAGNOSIS — I480 Paroxysmal atrial fibrillation: Secondary | ICD-10-CM | POA: Diagnosis not present

## 2018-06-22 DIAGNOSIS — Z5181 Encounter for therapeutic drug level monitoring: Secondary | ICD-10-CM | POA: Diagnosis not present

## 2018-06-22 DIAGNOSIS — I5033 Acute on chronic diastolic (congestive) heart failure: Secondary | ICD-10-CM | POA: Diagnosis not present

## 2018-06-22 LAB — POCT INR: INR: 3.6 — AB (ref 2.0–3.0)

## 2018-06-22 NOTE — Patient Instructions (Signed)
Hold coumadin tonight then resume 1 1/2 tablets daily except 1 tablet on Mondays, Wednesdays and Fridays Recheck in 3 weeks  

## 2018-06-22 NOTE — Patient Instructions (Signed)
Medication Instructions:  Your physician recommends that you continue on your current medications as directed. Please refer to the Current Medication list given to you today.   Labwork: none  Testing/Procedures: none  Follow-Up: Your physician recommends that you schedule a follow-up appointment in: To be determined based on Dr. Tanna Furry recommendations    Any Other Special Instructions Will Be Listed Below (If Applicable).     If you need a refill on your cardiac medications before your next appointment, please call your pharmacy.

## 2018-06-28 ENCOUNTER — Telehealth: Payer: Self-pay

## 2018-06-28 DIAGNOSIS — Z79899 Other long term (current) drug therapy: Secondary | ICD-10-CM

## 2018-06-28 DIAGNOSIS — I4819 Other persistent atrial fibrillation: Secondary | ICD-10-CM

## 2018-06-28 MED ORDER — TORSEMIDE 20 MG PO TABS: 80.0000 mg | ORAL_TABLET | Freq: Every day | ORAL | 3 refills | Status: DC

## 2018-06-28 NOTE — Telephone Encounter (Signed)
I spoke with patient, she will stop Lasix and start demadex 80 mg daily, I will mail lab slip.She knows about apt with dr.taylor on 07/23/18 at 9 am [place], and dr.McDowell 4/14 at 3:40 pm

## 2018-06-28 NOTE — Telephone Encounter (Signed)
-----   Message from Satira Sark, MD sent at 06/28/2018  8:42 AM EDT ----- Please see below communication.  Please schedule an office visit with Dr. Lovena Le, preferably next time he is in the office if possible.  We will hold off specifically on cardioversion for now, but let us switch her from Lasix 80 mg daily to Demadex 80 mg daily, otherwise continue potassium supplements.  See if this is more effective in terms of diuresis.  Recheck BMET in 1 week after switch.  Should then follow-up with me or APP in about 1 month. ----- Message ----- From: Evans Lance, MD Sent: 06/27/2018   8:53 PM EDT To: Satira Sark, MD  I know. I think amiodarone is her only option. Agree with uptitration of her diuretic. GT ----- Message ----- From: Satira Sark, MD Sent: 06/23/2018   8:41 AM EDT To: Evans Lance, MD  Okay thanks Carleene Overlie  In that case why don't you plan to see her back and discuss the options with her - she is going to need to hear that from you as well.  She generally does not tolerate atrial fibrillation very well and it does affect her diastolic heart failure.  I can try to increase her diuretics somewhat. ----- Message ----- From: Evans Lance, MD Sent: 06/23/2018   8:28 AM EST To: Satira Sark, MD  She was out of rhythm in December but looked like she was in rhythm on her ECG in January. She was obviously out of rhythm on the ECG from this week. Her next loop interrogation will be in a couple of weeks. I am happy for you to cardiovert her. However, if she is going in and out of rhythm probably not worth it. We cannot increase the dose of dofetilide unfortunately.  ----- Message ----- From: Satira Sark, MD Sent: 06/23/2018   8:03 AM EST To: Evans Lance, MD, Marikay Alar Pinnix, LPN  You can certainly see her if you would like to, but I thought you might just be able to look at her ECG and do a device interrogation to confirm that she has been in persistent atrial  fibrillation/flutter for the last few months.  If that is the case, we could probably just set up an elective cardioversion.  If you would like to see her first, please just let Alda Berthold know so that they can schedule her visit. ----- Message ----- From: Evans Lance, MD Sent: 06/22/2018   9:11 PM EST To: Satira Sark, MD  Sam, I am glad to see in [place] or [place]. GT ----- Message ----- From: Satira Sark, MD Sent: 06/22/2018   1:31 PM EST To: Evans Lance, MD  Carleene Overlie - please review my note and recent ECG. Suggest follow-up device interrogation and likely cardioversion. She would like your opinion. Thanks.

## 2018-07-05 ENCOUNTER — Telehealth: Payer: Self-pay | Admitting: Cardiology

## 2018-07-05 NOTE — Telephone Encounter (Signed)
Pt will have lab work done

## 2018-07-05 NOTE — Telephone Encounter (Signed)
Wants to know if she should definitely come in to have her lab work done, how important are these labs?

## 2018-07-06 ENCOUNTER — Other Ambulatory Visit: Payer: Self-pay | Admitting: Cardiology

## 2018-07-07 LAB — BASIC METABOLIC PANEL
BUN/Creatinine Ratio: 23 (ref 12–28)
BUN: 32 mg/dL — ABNORMAL HIGH (ref 8–27)
CHLORIDE: 104 mmol/L (ref 96–106)
CO2: 26 mmol/L (ref 20–29)
Calcium: 9.8 mg/dL (ref 8.7–10.3)
Creatinine, Ser: 1.39 mg/dL — ABNORMAL HIGH (ref 0.57–1.00)
GFR calc Af Amer: 40 mL/min/{1.73_m2} — ABNORMAL LOW (ref >59)
GFR calc non Af Amer: 35 mL/min/{1.73_m2} — ABNORMAL LOW (ref >59)
Glucose: 113 mg/dL — ABNORMAL HIGH (ref 65–99)
POTASSIUM: 4.1 mmol/L (ref 3.5–5.2)
Sodium: 146 mmol/L — ABNORMAL HIGH (ref 134–144)

## 2018-07-07 LAB — SPECIMEN STATUS REPORT

## 2018-07-12 ENCOUNTER — Telehealth: Payer: Self-pay | Admitting: Pharmacist

## 2018-07-12 NOTE — Telephone Encounter (Signed)

## 2018-07-13 ENCOUNTER — Ambulatory Visit (INDEPENDENT_AMBULATORY_CARE_PROVIDER_SITE_OTHER): Payer: Medicare Other | Admitting: *Deleted

## 2018-07-13 DIAGNOSIS — I480 Paroxysmal atrial fibrillation: Secondary | ICD-10-CM

## 2018-07-13 DIAGNOSIS — Z5181 Encounter for therapeutic drug level monitoring: Secondary | ICD-10-CM | POA: Diagnosis not present

## 2018-07-13 LAB — POCT INR: INR: 3.4 — AB (ref 2.0–3.0)

## 2018-07-13 NOTE — Patient Instructions (Signed)
Hold coumadin tonight then decrease dose to 1 tablet daily except 1 1/2 tablets on Sundays, Tuesdays and Thursdays Recheck in 4 weeks

## 2018-07-18 ENCOUNTER — Ambulatory Visit (INDEPENDENT_AMBULATORY_CARE_PROVIDER_SITE_OTHER): Payer: Medicare Other | Admitting: *Deleted

## 2018-07-18 ENCOUNTER — Other Ambulatory Visit: Payer: Self-pay

## 2018-07-18 DIAGNOSIS — I472 Ventricular tachycardia: Secondary | ICD-10-CM | POA: Diagnosis not present

## 2018-07-18 DIAGNOSIS — I5033 Acute on chronic diastolic (congestive) heart failure: Secondary | ICD-10-CM

## 2018-07-18 DIAGNOSIS — I4729 Other ventricular tachycardia: Secondary | ICD-10-CM

## 2018-07-18 LAB — CUP PACEART REMOTE DEVICE CHECK
Battery Remaining Longevity: 24 mo
Battery Voltage: 2.93 V
Brady Statistic AP VP Percent: 34.77 %
Brady Statistic AP VS Percent: 0 %
Brady Statistic AS VP Percent: 65.1 %
Brady Statistic AS VS Percent: 0.13 %
Brady Statistic RA Percent Paced: 34.16 %
Brady Statistic RV Percent Paced: 99.73 %
Date Time Interrogation Session: 20200330140431
HighPow Impedance: 55 Ohm
HighPow Impedance: 74 Ohm
Implantable Lead Implant Date: 20030823
Implantable Lead Implant Date: 20030823
Implantable Lead Location: 753859
Implantable Lead Location: 753860
Implantable Lead Model: 4086
Implantable Lead Serial Number: 112190
Implantable Pulse Generator Implant Date: 20150608
Lead Channel Impedance Value: 304 Ohm
Lead Channel Impedance Value: 456 Ohm
Lead Channel Pacing Threshold Amplitude: 0.75 V
Lead Channel Pacing Threshold Amplitude: 1.25 V
Lead Channel Pacing Threshold Pulse Width: 0.4 ms
Lead Channel Pacing Threshold Pulse Width: 0.4 ms
Lead Channel Sensing Intrinsic Amplitude: 1.625 mV
Lead Channel Sensing Intrinsic Amplitude: 2.25 mV
Lead Channel Setting Pacing Amplitude: 2 V
Lead Channel Setting Pacing Amplitude: 2.5 V
Lead Channel Setting Sensing Sensitivity: 0.3 mV
MDC IDC LEAD SERIAL: 108215
MDC IDC MSMT LEADCHNL RA SENSING INTR AMPL: 1.625 mV
MDC IDC MSMT LEADCHNL RV IMPEDANCE VALUE: 304 Ohm
MDC IDC MSMT LEADCHNL RV SENSING INTR AMPL: 2.25 mV
MDC IDC SET LEADCHNL RV PACING PULSEWIDTH: 0.4 ms

## 2018-07-25 ENCOUNTER — Telehealth: Payer: Self-pay | Admitting: *Deleted

## 2018-07-25 NOTE — Telephone Encounter (Signed)
Spoke with patient.  She was started on Doxycycline 100mg  bid x 10 days on 07/22/18 by Dr Luan Pulling for leg wound.  Will continue current dose of coumadin (was decreased at last visit) and check INR on 07/28/18.  Pt in agreement.

## 2018-07-25 NOTE — Telephone Encounter (Signed)
Patient would like to speak with you regarding new medicine she was placed on and Coumadin instructions due to it. / tg

## 2018-07-26 ENCOUNTER — Encounter: Payer: Medicare Other | Admitting: Internal Medicine

## 2018-07-26 ENCOUNTER — Telehealth (INDEPENDENT_AMBULATORY_CARE_PROVIDER_SITE_OTHER): Payer: Medicare Other | Admitting: Internal Medicine

## 2018-07-26 VITALS — Ht 60.0 in | Wt 173.6 lb

## 2018-07-26 DIAGNOSIS — I5033 Acute on chronic diastolic (congestive) heart failure: Secondary | ICD-10-CM | POA: Diagnosis not present

## 2018-07-26 DIAGNOSIS — I472 Ventricular tachycardia: Secondary | ICD-10-CM

## 2018-07-26 DIAGNOSIS — I4729 Other ventricular tachycardia: Secondary | ICD-10-CM

## 2018-07-26 DIAGNOSIS — I4819 Other persistent atrial fibrillation: Secondary | ICD-10-CM

## 2018-07-26 NOTE — Progress Notes (Signed)
Electrophysiology TeleHealth Note   Due to national recommendations of social distancing due to COVID 19, an audio/video telehealth visit is felt to be most appropriate for this patient at this time. We were unable to perform the video portion due to technical limitations from the patient.  See MyChart message from today for the patient's consent to telehealth for [facility].   Date:  07/26/2018   ID:  [name] [name], DOB [birth year], MRN [id number]  Location: patient's home  Provider location: [address] [place]  Evaluation Performed: Follow-up visit  PCP:  Sinda Du, MD  Cardiologist:  Rozann Lesches, MD  Electrophysiologist:  Dr Lovena Le  Chief Complaint:  "I am still short of breath"  History of Present Illness:    [name] [name] is a 85 y.o. female who presents via audio/video conferencing for a telehealth visit today.  Since last being seen in our clinic, the patient reports some sob and fatigue.  Today, she denies symptoms of palpitations, chest pain, but has had shortness of breath,  And worsening lower extremity edema. No dizziness, presyncope, or syncope.  No ICD shock. The patient is otherwise without complaint today.  The patient denies symptoms of fevers, chills, cough, or new SOB worrisome for COVID 19.  Past Medical History:  Diagnosis Date  . Atrial fibrillation ([place])    a. anticoagulated with Warfarin  . CHF (congestive heart failure) ([place])   . Chronic back pain   . Chronic diastolic heart failure ([place])   . Complete heart block ([place])   . COPD (chronic obstructive pulmonary disease) ([place])   . Coronary atherosclerosis of native coronary artery    Multivessel status post CABG, DES PLA March 2006  . DDD (degenerative disc disease), lumbar   . Essential hypertension, benign   . Mixed hyperlipidemia   . Osteoarthritis   . Ventricular fibrillation ([place]) 2003   a. seen on PPM interrogation a/w syncope    Past Surgical History:  Procedure  Laterality Date  . CARDIAC DEFIBRILLATOR PLACEMENT     MDT dual chamber ICD  . CARDIOVERSION N/A 08/09/2014   Procedure: CARDIOVERSION;  Surgeon: Sanda Klein, MD;  [facility]: [facility];  Service: Cardiovascular;  Laterality: N/A;  . CORONARY ARTERY BYPASS GRAFT     LIMA to LAD, SVG to diagonal, SVG to ramus and OM  . IMPLANTABLE CARDIOVERTER DEFIBRILLATOR GENERATOR CHANGE N/A 09/25/2013   Procedure: IMPLANTABLE CARDIOVERTER DEFIBRILLATOR GENERATOR CHANGE;  Surgeon: Evans Lance, MD;  Location: [facility] CATH LAB;  Service: Cardiovascular;  Laterality: N/A;  . LEAD REVISION N/A 09/29/2013   Procedure: LEAD REVISION;  Surgeon: Deboraha Sprang, MD;  Location: [facility] CATH LAB;  Service: Cardiovascular;  Laterality: N/A;  . TONSILLECTOMY    . YAG LASER APPLICATION Left 10/27/238   Procedure: YAG LASER APPLICATION;  Surgeon: Elta Guadeloupe T. Gershon Crane, MD;  Location: [facility] OR[facility];  Service: Ophthalmology;  Laterality: Left;  . YAG LASER APPLICATION Right 9/73/5329   Procedure: YAG LASER APPLICATION;  Surgeon: Elta Guadeloupe T. Gershon Crane, MD;  Location: [facility];  Service: Ophthalmology;  Laterality: Right;    Current Outpatient Medications  Medication Sig Dispense Refill  . acetaminophen (TYLENOL) 500 MG tablet Take 500 mg by mouth every morning.     [name] albuterol (PROVENTIL HFA;VENTOLIN HFA) 108 (90 BASE) MCG/ACT inhaler Inhale 2 puffs into the lungs every 6 (six) hours as needed for wheezing or shortness of breath. 1 Inhaler 2  . amLODipine (NORVASC) 5 MG tablet Take 1 tablet (5 mg  total) by mouth daily. 90 tablet 3  . atorvastatin (LIPITOR) 20 MG tablet TAKE ONE TABLET BY MOUTH ONCE DAILY. (Patient taking differently: Take 20 mg by mouth daily. ) 90 tablet 3  . Calcium Carbonate-Vitamin D (CALCIUM 600 + D PO) Take 1 tablet by mouth 2 (two) times daily.    . Cholecalciferol (VITAMIN D) 2000 UNITS CAPS Take 1 capsule by mouth at bedtime.     . dofetilide (TIKOSYN) 125 MCG capsule TAKE (1) CAPSULE BY MOUTH TWICE DAILY. 180 capsule 0  .  doxycycline (VIBRAMYCIN) 100 MG capsule     . folic acid (FOLVITE) 1 MG tablet Take 1 mg by mouth 2 (two) times daily.     [name] losartan (COZAAR) 100 MG tablet TAKE ONE TABLET BY MOUTH DAILY. 90 tablet 3  . metoprolol succinate (TOPROL-XL) 100 MG 24 hr tablet TAKE ONE TABLET BY MOUTH EVERY MORNING. TAKE WITH OR IMMEDIATELY FOLLOWING A MEAL. 90 tablet 3  . mupirocin ointment (BACTROBAN) 2 %     . Polyethyl Glycol-Propyl Glycol (SYSTANE OP) Place 1 drop into both eyes daily as needed (Dry eyes).    . potassium chloride SA (K-DUR,KLOR-CON) 20 MEQ tablet Take 20 mEq by mouth 3 (three) times daily.    [name] torsemide (DEMADEX) 20 MG tablet Take 4 tablets (80 mg total) by mouth daily. 360 tablet 3  . warfarin (COUMADIN) 2.5 MG tablet Take 1-1.5 tablets (2.5-3.75 mg total) by mouth as directed. Take 1 1/2 tablets daily except 1 tablet on Mondays and Thursdays. (Patient taking differently: Take 2.5-3.75 mg by mouth as directed. Take 3.75mg  daily except 2.5mg  on Mondays, Wednesday and Friday.) 60 tablet 5   No current facility-administered medications for this visit.     Allergies:   Codeine and Keflex [cephalexin]   Social History:  The patient  reports that she has never smoked. She has never used smokeless tobacco. She reports that she does not drink alcohol or use drugs.   Family History:  The patient's  family history includes Heart attack in her brother and father.   ROS:  Please see the history of present illness.   All other systems are personally reviewed and negative.    Exam:    Vital Signs:  Ht 5' (1.524 m)   Wt 173 lb 9.6 oz (78.7 kg)   BMI 33.90 kg/m     Labs/Other Tests and Data Reviewed:    Recent Labs: 07/06/2018: BUN 32; Creatinine, Ser 1.39; Potassium 4.1; Sodium 146   Wt Readings from Last 3 Encounters:  07/26/18 173 lb 9.6 oz (78.7 kg)  06/22/18 177 lb (80.3 kg)  03/26/18 170 lb (77.1 kg)     Other studies personally reviewed:  Last device remote is reviewed from  [place] PDF dated 3/20 which reveals normal device function, and atrial fib/flutter   ASSESSMENT & PLAN:    1.  Recurrent and persistent atrial fib/flutter - the patients rate is controlled due to her underlying CHB but she is symptomatic. I would normally plan a DCCV but we are not performing elective procedures at this point due to the Covid virus. I will plan to see her in the office in May with the anticipation that she be cardioverted next month if she is still out of rhythm. 2. VT/VF - she has had no ICD shocks since her last visit. 3. ICD - her device interogatio from 3/20 demonstrates normal function. 4. Acute on chronic systolic/diastolic heart failure - her symptoms have gone from class 2A  to 3A. She has tolerated the torsemide with a little improvement. I considered adding metolazone but am concerned about hypokalemia. We will hold off on this for now. 5. COVID 19 screen The patient denies symptoms of COVID 19 at this time.  The importance of social distancing was discussed today.  Follow-up:  5/20 Next remote: 6/20  Current medicines are reviewed at length with the patient today.   The patient does not have concerns regarding her medicines.  The following changes were made today:  none  Labs/ tests ordered today include: none No orders of the defined types were placed in this encounter.    Patient Risk:  after full review of this patients clinical status, I feel that they are at moderate risk at this time.  Today, I have spent 25 minutes with the patient with telehealth technology discussing the problems as noted above .    Signed, [name], MD  07/26/2018 8:42 AM     [facility] [address] [place] [address] [phone] (office) [phone] (fax)

## 2018-07-26 NOTE — Patient Instructions (Signed)
Medication Instructions:  Your physician recommends that you continue on your current medications as directed. Please refer to the Current Medication list given to you today.   Labwork: NONE   Testing/Procedures: NONE   Follow-Up: Your physician recommends that you schedule a follow-up appointment in: May with Dr. Lovena Le    Any Other Special Instructions Will Be Listed Below (If Applicable).     If you need a refill on your cardiac medications before your next appointment, please call your pharmacy. Thank you for choosing [facility]!

## 2018-07-27 ENCOUNTER — Telehealth: Payer: Self-pay | Admitting: Cardiology

## 2018-07-27 NOTE — Telephone Encounter (Signed)
° ° ° °  COVID-19 Pre-Screening Questions: ° °• Do you currently have a fever? No °•  °• Have you recently travelled on a cruise, internationally, or to [place], [place], [place], [place], [place], or [place], [place] ([employer]) ? No °•  °• Have you been in contact with someone that is currently pending confirmation of Covid19 testing or has been confirmed to have the Covid19 virus? No °•  °• Are you currently experiencing fatigue or cough? No  ° ° °   ° ° ° ° ° °

## 2018-07-27 NOTE — Progress Notes (Signed)
Remote ICD transmission.   

## 2018-07-28 ENCOUNTER — Ambulatory Visit (INDEPENDENT_AMBULATORY_CARE_PROVIDER_SITE_OTHER): Payer: Medicare Other | Admitting: *Deleted

## 2018-07-28 DIAGNOSIS — Z5181 Encounter for therapeutic drug level monitoring: Secondary | ICD-10-CM

## 2018-07-28 DIAGNOSIS — I480 Paroxysmal atrial fibrillation: Secondary | ICD-10-CM | POA: Diagnosis not present

## 2018-07-28 DIAGNOSIS — I4891 Unspecified atrial fibrillation: Secondary | ICD-10-CM

## 2018-07-28 LAB — POCT INR: INR: 3 (ref 2.0–3.0)

## 2018-07-28 NOTE — Patient Instructions (Signed)
Continue coumadin 1 tablet daily except 1 1/2 tablets on Sundays, Tuesdays and Thursdays Recheck in 4 weeks

## 2018-07-29 ENCOUNTER — Telehealth: Payer: Self-pay | Admitting: Cardiology

## 2018-07-29 NOTE — Telephone Encounter (Signed)
Patient being started on clindamycin, last INR check was yesterday, INR was 3.0.  Clindamycin does not interact with warfarin. Patient may keep her original appointment

## 2018-07-29 NOTE — Telephone Encounter (Signed)
Patient called stating that she has been put on an antibiotic. She wants to know if this will interfere with her coumdin.

## 2018-08-02 ENCOUNTER — Ambulatory Visit: Payer: Medicare Other | Admitting: Cardiology

## 2018-08-09 ENCOUNTER — Telehealth: Payer: Self-pay | Admitting: Cardiology

## 2018-08-09 NOTE — Telephone Encounter (Signed)
Pt just wanted to let me know she had been on Clindamycin.  She had already cleared with [facility] and was told it was ok to take. Has f/u INR 08/24/18

## 2018-08-09 NOTE — Telephone Encounter (Signed)
Patient asking to speak with you

## 2018-08-10 ENCOUNTER — Encounter (HOSPITAL_BASED_OUTPATIENT_CLINIC_OR_DEPARTMENT_OTHER): Payer: Medicare Other | Attending: Physician Assistant

## 2018-08-10 DIAGNOSIS — I872 Venous insufficiency (chronic) (peripheral): Secondary | ICD-10-CM | POA: Diagnosis not present

## 2018-08-10 DIAGNOSIS — I10 Essential (primary) hypertension: Secondary | ICD-10-CM | POA: Diagnosis not present

## 2018-08-10 DIAGNOSIS — I480 Paroxysmal atrial fibrillation: Secondary | ICD-10-CM | POA: Diagnosis not present

## 2018-08-10 DIAGNOSIS — J449 Chronic obstructive pulmonary disease, unspecified: Secondary | ICD-10-CM | POA: Insufficient documentation

## 2018-08-10 DIAGNOSIS — L97815 Non-pressure chronic ulcer of other part of right lower leg with muscle involvement without evidence of necrosis: Secondary | ICD-10-CM | POA: Insufficient documentation

## 2018-08-16 ENCOUNTER — Encounter: Payer: Medicare Other | Admitting: Internal Medicine

## 2018-08-17 ENCOUNTER — Other Ambulatory Visit (HOSPITAL_COMMUNITY)
Admission: RE | Admit: 2018-08-17 | Discharge: 2018-08-17 | Disposition: A | Discharge status: Home / Self Care | Payer: Medicare Other | Source: Other Acute Inpatient Hospital | Attending: Physician Assistant | Admitting: Physician Assistant

## 2018-08-17 DIAGNOSIS — L97812 Non-pressure chronic ulcer of other part of right lower leg with fat layer exposed: Secondary | ICD-10-CM | POA: Diagnosis present

## 2018-08-17 DIAGNOSIS — L97815 Non-pressure chronic ulcer of other part of right lower leg with muscle involvement without evidence of necrosis: Secondary | ICD-10-CM | POA: Diagnosis not present

## 2018-08-20 LAB — AEROBIC CULTURE W GRAM STAIN (SUPERFICIAL SPECIMEN): Culture: NO GROWTH

## 2018-08-22 ENCOUNTER — Ambulatory Visit (INDEPENDENT_AMBULATORY_CARE_PROVIDER_SITE_OTHER): Payer: Medicare Other | Admitting: *Deleted

## 2018-08-22 ENCOUNTER — Telehealth: Payer: Self-pay

## 2018-08-22 DIAGNOSIS — I10 Essential (primary) hypertension: Secondary | ICD-10-CM

## 2018-08-22 DIAGNOSIS — I4891 Unspecified atrial fibrillation: Secondary | ICD-10-CM | POA: Diagnosis not present

## 2018-08-22 LAB — POCT INR: INR: 2.2 (ref 2.0–3.0)

## 2018-08-22 NOTE — Patient Instructions (Signed)
Continue coumadin 1 tablet daily except 1 1/2 tablets on Sundays, Tuesdays and Thursdays Recheck in 2 weeks

## 2018-08-22 NOTE — Telephone Encounter (Signed)
[name] [name] with Home [facility] [phone] did INR today (vs Wed) as she was there today for derssing change.   INR 2.2   Please call [name] [name] with instructions

## 2018-08-24 ENCOUNTER — Encounter (HOSPITAL_BASED_OUTPATIENT_CLINIC_OR_DEPARTMENT_OTHER): Payer: Medicare Other | Attending: Physician Assistant

## 2018-08-24 ENCOUNTER — Other Ambulatory Visit: Payer: Self-pay

## 2018-08-24 ENCOUNTER — Other Ambulatory Visit: Payer: Self-pay | Admitting: Cardiology

## 2018-08-24 DIAGNOSIS — J449 Chronic obstructive pulmonary disease, unspecified: Secondary | ICD-10-CM | POA: Diagnosis not present

## 2018-08-24 DIAGNOSIS — I872 Venous insufficiency (chronic) (peripheral): Secondary | ICD-10-CM | POA: Diagnosis not present

## 2018-08-24 DIAGNOSIS — L97815 Non-pressure chronic ulcer of other part of right lower leg with muscle involvement without evidence of necrosis: Secondary | ICD-10-CM | POA: Insufficient documentation

## 2018-08-24 DIAGNOSIS — L97812 Non-pressure chronic ulcer of other part of right lower leg with fat layer exposed: Secondary | ICD-10-CM | POA: Diagnosis present

## 2018-08-24 DIAGNOSIS — I480 Paroxysmal atrial fibrillation: Secondary | ICD-10-CM | POA: Diagnosis not present

## 2018-08-24 DIAGNOSIS — I110 Hypertensive heart disease with heart failure: Secondary | ICD-10-CM | POA: Diagnosis not present

## 2018-08-24 DIAGNOSIS — I509 Heart failure, unspecified: Secondary | ICD-10-CM | POA: Insufficient documentation

## 2018-08-31 DIAGNOSIS — L97815 Non-pressure chronic ulcer of other part of right lower leg with muscle involvement without evidence of necrosis: Secondary | ICD-10-CM | POA: Diagnosis not present

## 2018-09-06 ENCOUNTER — Telehealth: Payer: Self-pay | Admitting: *Deleted

## 2018-09-06 NOTE — Telephone Encounter (Signed)
° ° °  COVID-19 Pre-Screening Questions: ° °• In the past 7 to 10 days have you had a cough,  shortness of breath, headache, congestion, fever, body aches, chills, sore throat, or sudden loss of taste or sense of smell?  No °•  °• Have you been around anyone with known Covid 19. No ° ° °• Have you been around anyone who is awaiting Covid 19 test results in the past 7 to 10 days?  No °•  °• Have you been around anyone who has been exposed to Covid 19, or has mentioned symptoms of Covid 19 within the past 7 to 10 days?  No °•  ° °If you have any concerns/questions about symptoms patients report during screening (either on the phone or at threshold). Contact the provider seeing the patient or DOD for further guidance.  If neither are available contact a member of the leadership team. ° ° ° °   ° ° ° ° °

## 2018-09-07 ENCOUNTER — Ambulatory Visit (INDEPENDENT_AMBULATORY_CARE_PROVIDER_SITE_OTHER): Payer: Medicare Other | Admitting: *Deleted

## 2018-09-07 ENCOUNTER — Other Ambulatory Visit: Payer: Self-pay

## 2018-09-07 DIAGNOSIS — Z5181 Encounter for therapeutic drug level monitoring: Secondary | ICD-10-CM

## 2018-09-07 DIAGNOSIS — I480 Paroxysmal atrial fibrillation: Secondary | ICD-10-CM | POA: Diagnosis not present

## 2018-09-07 DIAGNOSIS — L97815 Non-pressure chronic ulcer of other part of right lower leg with muscle involvement without evidence of necrosis: Secondary | ICD-10-CM | POA: Diagnosis not present

## 2018-09-07 LAB — POCT INR: INR: 3 (ref 2.0–3.0)

## 2018-09-07 NOTE — Patient Instructions (Signed)
Continue coumadin 1 tablet daily except 1 1/2 tablets on Sundays, Tuesdays and Thursdays Recheck in 4 weeks

## 2018-09-08 ENCOUNTER — Telehealth: Payer: Self-pay | Admitting: Internal Medicine

## 2018-09-08 NOTE — Telephone Encounter (Signed)
Virtual Visit Pre-Appointment Phone Call  "(Name), I am calling you today to discuss your upcoming appointment. We are currently trying to limit exposure to the virus that causes COVID-19 by seeing patients at home rather than in the office."  1. "What is the BEST phone number to call the day of the visit?" - include this in appointment notes  2. Do you have or have access to (through a family member/friend) a smartphone with video capability that we can use for your visit?" a. If yes - list this number in appt notes as cell (if different from BEST phone #) and list the appointment type as a VIDEO visit in appointment notes b. If no - list the appointment type as a PHONE visit in appointment notes  3. Confirm consent - "In the setting of the current Covid19 crisis, you are scheduled for a (phone or video) visit with your provider on (date) at (time).  Just as we do with many in-office visits, in order for you to participate in this visit, we must obtain consent.  If you'd like, I can send this to your mychart (if signed up) or email for you to review.  Otherwise, I can obtain your verbal consent now.  All virtual visits are billed to your insurance company just like a normal visit would be.  By agreeing to a virtual visit, we'd like you to understand that the technology does not allow for your provider to perform an examination, and thus may limit your provider's ability to fully assess your condition. If your provider identifies any concerns that need to be evaluated in person, we will make arrangements to do so.  Finally, though the technology is pretty good, we cannot assure that it will always work on either your or our end, and in the setting of a video visit, we may have to convert it to a phone-only visit.  In either situation, we cannot ensure that we have a secure connection.  Are you willing to proceed?" STAFF: Did the patient verbally acknowledge consent to telehealth visit? Document  YES/NO here: Yes  4. Advise patient to be prepared - "Two hours prior to your appointment, go ahead and check your blood pressure, pulse, oxygen saturation, and your weight (if you have the equipment to check those) and write them all down. When your visit starts, your provider will ask you for this information. If you have an Apple Watch or Kardia device, please plan to have heart rate information ready on the day of your appointment. Please have a pen and paper handy nearby the day of the visit as well."  5. Give patient instructions for MyChart download to smartphone OR [email or web address] as below if video visit (depending on what platform provider is using)  6. Inform patient they will receive a phone call 15 minutes prior to their appointment time (may be from unknown caller ID) so they should be prepared to answer    TELEPHONE CALL NOTE  [place] has been deemed a candidate for a follow-up tele-health visit to limit community exposure during the Covid-19 pandemic. I spoke with the patient via phone to ensure availability of phone/video source, confirm preferred email & phone number, and discuss instructions and expectations.  I reminded [name] [name] to be prepared with any vital sign and/or heart rhythm information that could potentially be obtained via home monitoring, at the time of her visit. I reminded [name] [name] to expect a phone call prior to  her visit.  Orinda Kenner 09/08/2018 12:35 PM

## 2018-09-14 ENCOUNTER — Telehealth: Payer: Self-pay | Admitting: *Deleted

## 2018-09-14 ENCOUNTER — Telehealth (INDEPENDENT_AMBULATORY_CARE_PROVIDER_SITE_OTHER): Payer: Medicare Other | Admitting: Internal Medicine

## 2018-09-14 ENCOUNTER — Other Ambulatory Visit: Payer: Self-pay

## 2018-09-14 VITALS — BP 124/82 | HR 64 | Ht 60.0 in | Wt 169.6 lb

## 2018-09-14 DIAGNOSIS — I4819 Other persistent atrial fibrillation: Secondary | ICD-10-CM | POA: Diagnosis not present

## 2018-09-14 DIAGNOSIS — I472 Ventricular tachycardia: Secondary | ICD-10-CM | POA: Diagnosis not present

## 2018-09-14 DIAGNOSIS — Z95810 Presence of automatic (implantable) cardiac defibrillator: Secondary | ICD-10-CM

## 2018-09-14 DIAGNOSIS — I4729 Other ventricular tachycardia: Secondary | ICD-10-CM

## 2018-09-14 NOTE — Telephone Encounter (Signed)
Spoke with patient to obtain manual transmission for review of rhythm per Dr. Lovena Le.  Transmission successful. Presenting rhythm Ap/Vp @73bpm . 3 NSVT episodes noted. 77.6% AT/AF burden since 3/31. Most recent AF episode terminated on 09/12/18. See 90 day trend below. OptiVol trending toward baseline at present.  Routed to Dr. Lovena Le. Pt made aware that she is in SR today and she is aware we will call if any new recommendations.

## 2018-09-14 NOTE — Progress Notes (Signed)
Electrophysiology TeleHealth Note   Due to national recommendations of social distancing due to COVID 19, an audio/video telehealth visit is felt to be most appropriate for this patient at this time.  See MyChart message from today for the patient's consent to telehealth for [facility].   Date:  09/14/2018   ID:  [name], [name] 06-02-1931, MRN [id number]  Location: patient's home  Provider location: [address], [place]  Evaluation Performed: Follow-up visit  PCP:  [name] Du, MD  Cardiologist:  [name] Lesches, MD  Electrophysiologist:  Dr [name] [name]  Chief Complaint:  "I'm doing better. I can't tell if I am in rhythm or not."  History of Present Illness:    [name] [name] is a 85 y.o. female who presents via audio/video conferencing for a telehealth visit today. She is a pleasant 85 yo woman with a h/o CHB, s/p PPM, VF s/p upgrade to an ICD, persistent atrial fib who was treated with dofetilide who I saw via televisit several weeks ago.  Since last being seen in our clinic, the patient feels ok though she is not sure whether or not she is in atrial fib. Her INR was 3 last week.  Today, she denies symptoms of palpitations, chest pain, shortness of breath,  lower extremity edema, dizziness, presyncope, or syncope.  The patient is otherwise without complaint today.  The patient denies symptoms of fevers, chills, cough, or new SOB worrisome for COVID 19.  Past Medical History:  Diagnosis Date  . Atrial fibrillation ([place])    a. anticoagulated with Warfarin  . CHF (congestive heart failure) ([place])   . Chronic back pain   . Chronic diastolic heart failure ([place])   . Complete heart block ([place])   . COPD (chronic obstructive pulmonary disease) ([place])   . Coronary atherosclerosis of native coronary artery    Multivessel status post CABG, DES PLA March 2006  . DDD (degenerative disc disease), lumbar   . Essential hypertension, benign   . Mixed hyperlipidemia   .  Osteoarthritis   . Ventricular fibrillation ([place]) 2003   a. seen on PPM interrogation a/w syncope    Past Surgical History:  Procedure Laterality Date  . CARDIAC DEFIBRILLATOR PLACEMENT     MDT dual chamber ICD  . CARDIOVERSION N/A 08/09/2014   Procedure: CARDIOVERSION;  Surgeon: Sanda Klein, MD;  Location: [facility];  Service: Cardiovascular;  Laterality: N/A;  . CORONARY ARTERY BYPASS GRAFT     LIMA to LAD, SVG to diagonal, SVG to ramus and OM  . IMPLANTABLE CARDIOVERTER DEFIBRILLATOR GENERATOR CHANGE N/A 09/25/2013   Procedure: IMPLANTABLE CARDIOVERTER DEFIBRILLATOR GENERATOR CHANGE;  Surgeon: Evans Lance, MD;  Location: [facility] CATH LAB;  Service: Cardiovascular;  Laterality: N/A;  . LEAD REVISION N/A 09/29/2013   Procedure: LEAD REVISION;  Surgeon: Deboraha Sprang, MD;  Location: [facility] CATH LAB;  Service: Cardiovascular;  Laterality: N/A;  . TONSILLECTOMY    . YAG LASER APPLICATION Left 0/12/2955   Procedure: YAG LASER APPLICATION;  Surgeon: Elta Guadeloupe T. Gershon Crane, MD;  Location: [facility] ORS;  Service: Ophthalmology;  Laterality: Left;  . YAG LASER APPLICATION Right 4/73/4037   Procedure: YAG LASER APPLICATION;  Surgeon: Elta Guadeloupe T. Gershon Crane, MD;  Location: [facility] OR[facility];  Service: Ophthalmology;  Laterality: Right;    Current Outpatient Medications  Medication Sig Dispense Refill  . acetaminophen (TYLENOL) 500 MG tablet Take 500 mg by mouth every morning.     [name] albuterol (PROVENTIL HFA;VENTOLIN HFA) 108 (90 BASE) MCG/ACT  inhaler Inhale 2 puffs into the lungs every 6 (six) hours as needed for wheezing or shortness of breath. 1 Inhaler 2  . amLODipine (NORVASC) 5 MG tablet Take 1 tablet (5 mg total) by mouth daily. 90 tablet 3  . atorvastatin (LIPITOR) 20 MG tablet TAKE ONE TABLET BY MOUTH ONCE DAILY. (Patient taking differently: Take 20 mg by mouth daily. ) 90 tablet 3  . Calcium Carbonate-Vitamin D (CALCIUM 600 + D PO) Take 1 tablet by mouth 2 (two) times daily.    . Cholecalciferol (VITAMIN D) 2000 UNITS  CAPS Take 1 capsule by mouth at bedtime.     . dofetilide (TIKOSYN) 125 MCG capsule TAKE (1) CAPSULE BY MOUTH TWICE DAILY. 180 capsule 0  . doxycycline (VIBRAMYCIN) 100 MG capsule     . folic acid (FOLVITE) 1 MG tablet Take 1 mg by mouth 2 (two) times daily.     [name] losartan (COZAAR) 100 MG tablet TAKE ONE TABLET BY MOUTH DAILY. 90 tablet 3  . metoprolol succinate (TOPROL-XL) 100 MG 24 hr tablet TAKE ONE TABLET BY MOUTH EVERY MORNING. TAKE WITH OR IMMEDIATELY FOLLOWING A MEAL. 90 tablet 3  . mupirocin ointment (BACTROBAN) 2 %     . Polyethyl Glycol-Propyl Glycol (SYSTANE OP) Place 1 drop into both eyes daily as needed (Dry eyes).    . potassium chloride SA (K-DUR,KLOR-CON) 20 MEQ tablet Take 20 mEq by mouth 3 (three) times daily.    [name] torsemide (DEMADEX) 20 MG tablet Take 4 tablets (80 mg total) by mouth daily. 360 tablet 3  . warfarin (COUMADIN) 2.5 MG tablet Take 1-1.5 tablets (2.5-3.75 mg total) by mouth as directed. Take 1 1/2 tablets daily except 1 tablet on Mondays and Thursdays. (Patient taking differently: Take 2.5-3.75 mg by mouth as directed. Take 3.75mg  daily except 2.5mg  on Mondays, Wednesday and Friday.) 60 tablet 5   No current facility-administered medications for this visit.     Allergies:   Codeine and Keflex [cephalexin]   Social History:  The patient  reports that she has never smoked. She has never used smokeless tobacco. She reports that she does not drink alcohol or use drugs.   Family History:  The patient's  family history includes Heart attack in her brother and father.   ROS:  Please see the history of present illness.   All other systems are personally reviewed and negative.    Exam:    Vital Signs:  BP 124/82   Pulse 64   Ht 5' (1.524 m)   Wt 169 lb 9.6 oz (76.9 kg)   BMI 33.12 kg/m   Well appearing, alert and conversant, regular work of breathing,  good skin color Eyes- anicteric, neuro- grossly intact, skin- no apparent rash or lesions or cyanosis, mouth-  oral mucosa is pink   Labs/Other Tests and Data Reviewed:    Recent Labs: 07/06/2018: BUN 32; Creatinine, Ser 1.39; Potassium 4.1; Sodium 146   Wt Readings from Last 3 Encounters:  09/14/18 169 lb 9.6 oz (76.9 kg)  07/26/18 173 lb 9.6 oz (78.7 kg)  06/22/18 177 lb (80.3 kg)     Other studies personally reviewed: Last device remote is reviewed from [place] PDF dated 3/20 which reveals normal device function, no arrhythmias except for atrial fib.   ASSESSMENT & PLAN:    1.  Persistent atrial fib - we will have her download a device interogation. If she is in rhythm then we will have her followup in 3 months. If she is out  of rhythm, she will need to come down to [facility] for a D[facility]V. 2. coags - she notes that she has been therapeutic with her INR's. 3. VT/VF - she has had no ICD shocks. 4. Chronic systolic heart failure - she is improved on demadex. 5. COVID 19 screen The patient denies symptoms of COVID 19 at this time.  The importance of social distancing was discussed today.  Follow-up:  3 months Next remote: today to determine if she is in NSR.  Current medicines are reviewed at length with the patient today.   The patient does not have concerns regarding her medicines.  The following changes were made today:  none  Labs/ tests ordered today include: none No orders of the defined types were placed in this encounter.    Patient Risk:  after full review of this patients clinical status, I feel that they are at moderate risk at this time.  Today, I have spent 25 minutes with the patient with telehealth technology discussing all of the above.    Signed, [name], MD  09/14/2018 8:28 AM     [place] [address] [phone] (office) [phone] (fax)

## 2018-09-14 NOTE — Patient Instructions (Signed)
Medication Instructions:  Your physician recommends that you continue on your current medications as directed. Please refer to the Current Medication list given to you today.   Labwork: NONE  Testing/Procedures: NONE  Follow-Up: Your physician recommends that you schedule a follow-up appointment in: 3 Months with Dr. Lovena Le.    Any Other Special Instructions Will Be Listed Below (If Applicable).     If you need a refill on your cardiac medications before your next appointment, please call your pharmacy.  Thank you for choosing [facility]!

## 2018-09-14 NOTE — Telephone Encounter (Signed)
Spoke with patient. She is aware of no changes per Dr. Lovena Le. Pt denies questions or concerns at this time and thanked me for my call.

## 2018-09-14 NOTE — Telephone Encounter (Signed)
Continue current meds 

## 2018-09-15 DIAGNOSIS — L97815 Non-pressure chronic ulcer of other part of right lower leg with muscle involvement without evidence of necrosis: Secondary | ICD-10-CM | POA: Diagnosis not present

## 2018-09-21 ENCOUNTER — Encounter (HOSPITAL_BASED_OUTPATIENT_CLINIC_OR_DEPARTMENT_OTHER): Payer: Medicare Other | Attending: Physician Assistant

## 2018-09-21 DIAGNOSIS — I110 Hypertensive heart disease with heart failure: Secondary | ICD-10-CM | POA: Insufficient documentation

## 2018-09-21 DIAGNOSIS — L97812 Non-pressure chronic ulcer of other part of right lower leg with fat layer exposed: Secondary | ICD-10-CM | POA: Insufficient documentation

## 2018-09-21 DIAGNOSIS — I872 Venous insufficiency (chronic) (peripheral): Secondary | ICD-10-CM | POA: Diagnosis not present

## 2018-09-21 DIAGNOSIS — I480 Paroxysmal atrial fibrillation: Secondary | ICD-10-CM | POA: Diagnosis not present

## 2018-09-21 DIAGNOSIS — J449 Chronic obstructive pulmonary disease, unspecified: Secondary | ICD-10-CM | POA: Insufficient documentation

## 2018-09-21 DIAGNOSIS — I509 Heart failure, unspecified: Secondary | ICD-10-CM | POA: Insufficient documentation

## 2018-09-25 NOTE — Progress Notes (Signed)
Virtual Visit via Telephone Note   This visit type was conducted due to national recommendations for restrictions regarding the COVID-19 Pandemic (e.g. social distancing) in an effort to limit this patient's exposure and mitigate transmission in our community.  Due to her co-morbid illnesses, this patient is at least at moderate risk for complications without adequate follow up.  This format is felt to be most appropriate for this patient at this time.  The patient did not have access to video technology/had technical difficulties with video requiring transitioning to audio format only (telephone).  All issues noted in this document were discussed and addressed.  No physical exam could be performed with this format.  Please refer to the patient's chart for her  consent to telehealth for [facility].   Date:  09/26/2018   ID:  [name], [name] 1932/01/16, MRN [id number]  Patient Location: Home Provider Location: Office  PCP:  [name] Du, MD  Cardiologist:  [name] Lesches, MD Electrophysiologist:  [name] Peru, MD   Evaluation Performed:  Follow-Up Visit  Chief Complaint:   Cardiac follow-up  History of Present Illness:    [name] [name] is an 85 y.o. female last seen in March.  She did not have video access and we spoke by phone today.  He states that she has been doing reasonably well.  She has been staying around the house, her children bring her food and other necessities.  She sees Dr. Lovena Le the device clinic, Medtronic ICD in place. She had a recent telehealth encounter with him. She was to have a device download to reassess rhythm.  She tells me that she was found to be in sinus rhythm and cardioversion was deferred.  She otherwise continues on antiarrhythmic therapy and Coumadin.  She remains on Coumadin with follow-up in the anticoagulation clinic. Last INR was 3.0.  She does not report any bleeding problems.   The patient does not have symptoms concerning for COVID-19  infection (fever, chills, cough, or new shortness of breath).    Past Medical History:  Diagnosis Date  . Atrial fibrillation ([place])    a. anticoagulated with Warfarin  . CHF (congestive heart failure) ([place])   . Chronic back pain   . Chronic diastolic heart failure ([place])   . Complete heart block ([place])   . COPD (chronic obstructive pulmonary disease) ([place])   . Coronary atherosclerosis of native coronary artery    Multivessel status post CABG, DES PLA March 2006  . DDD (degenerative disc disease), lumbar   . Essential hypertension, benign   . Mixed hyperlipidemia   . Osteoarthritis   . Ventricular fibrillation ([place]) 2003   a. seen on PPM interrogation a/w syncope   Past Surgical History:  Procedure Laterality Date  . CARDIAC DEFIBRILLATOR PLACEMENT     MDT dual chamber ICD  . CARDIOVERSION N/A 08/09/2014   Procedure: CARDIOVERSION;  Surgeon: Sanda Klein, MD;  [facility]: [facility];  Service: Cardiovascular;  Laterality: N/A;  . CORONARY ARTERY BYPASS GRAFT     LIMA to LAD, SVG to diagonal, SVG to ramus and OM  . IMPLANTABLE CARDIOVERTER DEFIBRILLATOR GENERATOR CHANGE N/A 09/25/2013   Procedure: IMPLANTABLE CARDIOVERTER DEFIBRILLATOR GENERATOR CHANGE;  Surgeon: Evans Lance, MD;  Location: [facility] CATH LAB;  Service: Cardiovascular;  Laterality: N/A;  . LEAD REVISION N/A 09/29/2013   Procedure: LEAD REVISION;  Surgeon: Deboraha Sprang, MD;  Location: [facility] CATH LAB;  Service: Cardiovascular;  Laterality: N/A;  . TONSILLECTOMY    . YAG LASER  APPLICATION Left 07/27/6757   Procedure: YAG LASER APPLICATION;  Surgeon: Elta Guadeloupe T. Gershon Crane, MD;  Location: [facility] ORS;  Service: Ophthalmology;  Laterality: Left;  . YAG LASER APPLICATION Right 1/63/8466   Procedure: YAG LASER APPLICATION;  Surgeon: Elta Guadeloupe T. Gershon Crane, MD;  Location: [facility] ORS;  Service: Ophthalmology;  Laterality: Right;     Current Meds  Medication Sig  . acetaminophen (TYLENOL) 500 MG tablet Take 500 mg by mouth every morning.   [name] albuterol  (PROVENTIL HFA;VENTOLIN HFA) 108 (90 BASE) MCG/ACT inhaler Inhale 2 puffs into the lungs every 6 (six) hours as needed for wheezing or shortness of breath.  [name] amLODipine (NORVASC) 5 MG tablet Take 1 tablet (5 mg total) by mouth daily.  [name] atorvastatin (LIPITOR) 20 MG tablet TAKE ONE TABLET BY MOUTH ONCE DAILY. (Patient taking differently: Take 20 mg by mouth daily. )  . Calcium Carbonate-Vitamin D (CALCIUM 600 + D PO) Take 1 tablet by mouth 2 (two) times daily.  . Cholecalciferol (VITAMIN D) 2000 UNITS CAPS Take 1 capsule by mouth at bedtime.   . dofetilide (TIKOSYN) 125 MCG capsule TAKE (1) CAPSULE BY MOUTH TWICE DAILY.  [name] doxycycline (VIBRAMYCIN) 100 MG capsule   . folic acid (FOLVITE) 1 MG tablet Take 1 mg by mouth 2 (two) times daily.   [name] losartan (COZAAR) 100 MG tablet TAKE ONE TABLET BY MOUTH DAILY.  . metoprolol succinate (TOPROL-XL) 100 MG 24 hr tablet TAKE ONE TABLET BY MOUTH EVERY MORNING. TAKE WITH OR IMMEDIATELY FOLLOWING A MEAL.  . mupirocin ointment (BACTROBAN) 2 %   . Polyethyl Glycol-Propyl Glycol (SYSTANE OP) Place 1 drop into both eyes daily as needed (Dry eyes).  . potassium chloride SA (K-DUR,KLOR-CON) 20 MEQ tablet Take 20 mEq by mouth 3 (three) times daily.  [name] torsemide (DEMADEX) 20 MG tablet Take 4 tablets (80 mg total) by mouth daily.  [name] warfarin (COUMADIN) 2.5 MG tablet Take 1-1.5 tablets (2.5-3.75 mg total) by mouth as directed. Take 1 1/2 tablets daily except 1 tablet on Mondays and Thursdays. (Patient taking differently: Take 2.5-3.75 mg by mouth as directed. Take 3.75mg  daily except 2.5mg  on Mondays, Wednesday and Friday.)     Allergies:   Codeine and Keflex [cephalexin]   Social History   Tobacco Use  . Smoking status: Never Smoker  . Smokeless tobacco: Never Used  Substance Use Topics  . Alcohol use: No    Alcohol/week: 0.0 standard drinks  . Drug use: No     Family Hx: The patient's family history includes Heart attack in her brother and father.  ROS:    Please see the history of present illness. All other systems reviewed and are negative.   Prior CV studies:   The following studies were reviewed today:  Echocardiogram 09/13/2013: Study Conclusions  - Left ventricle: The cavity size was normal. Wall thickness was increased in a pattern of mild LVH. Systolic function was normal. The estimated ejection fraction was in the range of 60% to 65%. Wall motion was normal; there were no regional wall motion abnormalities. Features are consistent with a pseudonormal left ventricular filling pattern, with concomitant abnormal relaxation and increased filling pressure (grade 2 diastolic dysfunction). Doppler parameters are consistent with elevated ventricular end-diastolic filling pressure. - Aortic valve: Mildly calcified annulus. Trileaflet; mildly calcified leaflets. There was very mild stenosis. There was no significant regurgitation. Mean gradient (S): 9 mm Hg. Valve area (VTI): 1.52 cm^2. Valve area (Vmax): 1.71 cm^2. - Mitral valve: Calcified annulus. Mildly thickened leaflets . There  was mild regurgitation. - Left atrium: The atrium was mildly to moderately dilated. - Right ventricle: The cavity size was mildly dilated. Pacer wire or catheter noted in right ventricle. - Right atrium: Central venous pressure (est): 3 mm Hg. - Atrial septum: No defect or patent foramen ovale was identified. - Tricuspid valve: There was mild regurgitation. - Pulmonary arteries: PA peak pressure: 42 mm Hg (S). - Pericardium, extracardiac: There was no pericardial effusion.  Impressions:  - Mild LVH with LVEF 53-29%, grade 2 diastolic dysfunction with increased filling pressures. MIld to moderate left atrial enlargement. Mild mitral regurgitation. Very mildly stenotic aortic valve. Device wire noted in right heart. Mild tricuspid regurgitation with PASP 42 mmHg.  Lexiscan Cardiolite  06/15/2014: FINDINGS: Pharmacological stress  Baseline EKG shows ventricular pacing. After injection heart rate increased from 61 beats per min up to 75 beats per min, and blood pressure decreased from 139/76 and 113/66. The test was stopped after injection was complete, the patient did not experience any chest pain. Post-injection EKG showed no significant arrhythmias, findings not interpretable for ischemia because she was ventricular paced throughout the study.  Perfusion: No decreased activity in the left ventricle on stress imaging to suggest reversible ischemia or infarction.  Wall Motion: Normal left ventricular wall motion. No left ventricular dilation.  Left Ventricular Ejection Fraction: 77 %  End diastolic volume 55 ml  End systolic volume 12 ml  IMPRESSION: 1. No reversible ischemia or infarction.  2. Normal left ventricular wall motion.  3. Left ventricular ejection fraction 77%  4. Low-risk stress test findings*.  Labs/Other Tests and Data Reviewed:    EKG:  An ECG dated 06/22/2018 was personally reviewed today and demonstrated:  Ventricular paced rhythm with underlying atrial fibrillation.  Recent Labs: 07/06/2018: BUN 32; Creatinine, Ser 1.39; Potassium 4.1; Sodium 146   Recent Lipid Panel Lab Results  Component Value Date/Time   CHOL 111 01/24/2013 09:46 AM   TRIG 119 01/24/2013 09:46 AM   HDL 31 (L) 01/24/2013 09:46 AM   CHOLHDL 3.6 01/24/2013 09:46 AM   LDLCALC 56 01/24/2013 09:46 AM    Wt Readings from Last 3 Encounters:  09/26/18 167 lb (75.8 kg)  09/14/18 169 lb 9.6 oz (76.9 kg)  07/26/18 173 lb 9.6 oz (78.7 kg)     Objective:    Vital Signs:  BP 138/78   Pulse 68   Ht 5' (1.524 m)   Wt 167 lb (75.8 kg)   BMI 32.61 kg/m    Patient spoke in full sentences, not short of breath. No audible wheezing or coughing. Speech pattern normal.  ASSESSMENT & PLAN:    1.  Persistent atrial fibrillation.  Plan is to continue Tikosyn,  Toprol-XL, and Coumadin.  She has had follow-up with Dr. Lovena Le and was not in atrial fibrillation at the time of last device download.  2.  Medtronic ICD in place.  Continue to follow with Dr. Lovena Le.  No reported device shocks.  Last LVEF was normal at 60 to 65%.  3.  CAD status post CABG and DES to the PLA.  She reports no active angina symptoms.  Last ischemic testing was in 2016.  4.  Essential hypertension, blood pressure is adequately controlled today.  No changes were made.  COVID-19 Education: The signs and symptoms of COVID-19 were discussed with the patient and how to seek care for testing (follow up with PCP or arrange E-visit).  The importance of social distancing was discussed today.  Time:   Today,  I have spent 8 minutes with the patient with telehealth technology discussing the above problems.     Medication Adjustments/Labs and Tests Ordered: Current medicines are reviewed at length with the patient today.  Concerns regarding medicines are outlined above.   Tests Ordered: No orders of the defined types were placed in this encounter.   Medication Changes: No orders of the defined types were placed in this encounter.   Disposition:  Follow up 8-month visit in the [place] office.  Signed, [name] Lesches, MD  09/26/2018 2:02 PM    [facility]

## 2018-09-26 ENCOUNTER — Encounter: Payer: Self-pay | Admitting: Cardiology

## 2018-09-26 ENCOUNTER — Telehealth (INDEPENDENT_AMBULATORY_CARE_PROVIDER_SITE_OTHER): Payer: Medicare Other | Admitting: Cardiology

## 2018-09-26 VITALS — BP 138/78 | HR 68 | Ht 60.0 in | Wt 167.0 lb

## 2018-09-26 DIAGNOSIS — Z7189 Other specified counseling: Secondary | ICD-10-CM | POA: Diagnosis not present

## 2018-09-26 DIAGNOSIS — I4819 Other persistent atrial fibrillation: Secondary | ICD-10-CM | POA: Diagnosis not present

## 2018-09-26 DIAGNOSIS — Z95810 Presence of automatic (implantable) cardiac defibrillator: Secondary | ICD-10-CM

## 2018-09-26 DIAGNOSIS — I2510 Atherosclerotic heart disease of native coronary artery without angina pectoris: Secondary | ICD-10-CM

## 2018-09-26 DIAGNOSIS — I10 Essential (primary) hypertension: Secondary | ICD-10-CM

## 2018-09-26 DIAGNOSIS — I25119 Atherosclerotic heart disease of native coronary artery with unspecified angina pectoris: Secondary | ICD-10-CM

## 2018-09-26 DIAGNOSIS — I5032 Chronic diastolic (congestive) heart failure: Secondary | ICD-10-CM

## 2018-09-26 NOTE — Progress Notes (Signed)

## 2018-09-29 DIAGNOSIS — L97812 Non-pressure chronic ulcer of other part of right lower leg with fat layer exposed: Secondary | ICD-10-CM | POA: Diagnosis not present

## 2018-10-03 ENCOUNTER — Ambulatory Visit (INDEPENDENT_AMBULATORY_CARE_PROVIDER_SITE_OTHER): Payer: Medicare Other | Admitting: *Deleted

## 2018-10-03 DIAGNOSIS — I4821 Permanent atrial fibrillation: Secondary | ICD-10-CM | POA: Diagnosis not present

## 2018-10-03 DIAGNOSIS — I480 Paroxysmal atrial fibrillation: Secondary | ICD-10-CM | POA: Diagnosis not present

## 2018-10-03 DIAGNOSIS — Z5181 Encounter for therapeutic drug level monitoring: Secondary | ICD-10-CM

## 2018-10-03 LAB — POCT INR: INR: 2.5 (ref 2.0–3.0)

## 2018-10-03 NOTE — Patient Instructions (Signed)
Continue coumadin 1 tablet daily except 1 1/2 tablets on Sundays, Tuesdays and Thursdays Recheck in 4 weeks

## 2018-10-05 DIAGNOSIS — L97812 Non-pressure chronic ulcer of other part of right lower leg with fat layer exposed: Secondary | ICD-10-CM | POA: Diagnosis not present

## 2018-10-12 DIAGNOSIS — L97812 Non-pressure chronic ulcer of other part of right lower leg with fat layer exposed: Secondary | ICD-10-CM | POA: Diagnosis not present

## 2018-10-17 ENCOUNTER — Ambulatory Visit (INDEPENDENT_AMBULATORY_CARE_PROVIDER_SITE_OTHER): Payer: Medicare Other | Admitting: *Deleted

## 2018-10-17 DIAGNOSIS — I472 Ventricular tachycardia, unspecified: Secondary | ICD-10-CM

## 2018-10-17 DIAGNOSIS — I4729 Other ventricular tachycardia: Secondary | ICD-10-CM

## 2018-10-18 DIAGNOSIS — L97812 Non-pressure chronic ulcer of other part of right lower leg with fat layer exposed: Secondary | ICD-10-CM | POA: Diagnosis not present

## 2018-10-18 LAB — CUP PACEART REMOTE DEVICE CHECK
Battery Remaining Longevity: 24 mo
Battery Voltage: 2.94 V
Brady Statistic AP VP Percent: 85.07 %
Brady Statistic AP VS Percent: 0.01 %
Brady Statistic AS VP Percent: 14.9 %
Brady Statistic AS VS Percent: 0.02 %
Brady Statistic RA Percent Paced: 83.46 %
Brady Statistic RV Percent Paced: 99.83 %
Date Time Interrogation Session: 20200629114228
HighPow Impedance: 59 Ohm
HighPow Impedance: 81 Ohm
Implantable Lead Implant Date: 20030823
Implantable Lead Implant Date: 20030823
Implantable Lead Location: 753859
Implantable Lead Location: 753860
Implantable Lead Model: 157
Implantable Lead Model: 4086
Implantable Lead Serial Number: 108215
Implantable Lead Serial Number: 112190
Implantable Pulse Generator Implant Date: 20150608
Lead Channel Impedance Value: 304 Ohm
Lead Channel Impedance Value: 342 Ohm
Lead Channel Impedance Value: 456 Ohm
Lead Channel Pacing Threshold Amplitude: 0.75 V
Lead Channel Pacing Threshold Amplitude: 1 V
Lead Channel Pacing Threshold Pulse Width: 0.4 ms
Lead Channel Pacing Threshold Pulse Width: 0.4 ms
Lead Channel Sensing Intrinsic Amplitude: 10.625 mV
Lead Channel Sensing Intrinsic Amplitude: 10.625 mV
Lead Channel Sensing Intrinsic Amplitude: 3 mV
Lead Channel Sensing Intrinsic Amplitude: 3 mV
Lead Channel Setting Pacing Amplitude: 2 V
Lead Channel Setting Pacing Amplitude: 2.5 V
Lead Channel Setting Pacing Pulse Width: 0.4 ms
Lead Channel Setting Sensing Sensitivity: 0.3 mV

## 2018-10-19 ENCOUNTER — Other Ambulatory Visit (HOSPITAL_COMMUNITY): Payer: Self-pay | Admitting: Pulmonary Disease

## 2018-10-19 DIAGNOSIS — Z1231 Encounter for screening mammogram for malignant neoplasm of breast: Secondary | ICD-10-CM

## 2018-10-26 ENCOUNTER — Encounter (HOSPITAL_BASED_OUTPATIENT_CLINIC_OR_DEPARTMENT_OTHER): Payer: Medicare Other | Attending: Internal Medicine

## 2018-10-26 DIAGNOSIS — I872 Venous insufficiency (chronic) (peripheral): Secondary | ICD-10-CM | POA: Insufficient documentation

## 2018-10-26 DIAGNOSIS — J449 Chronic obstructive pulmonary disease, unspecified: Secondary | ICD-10-CM | POA: Diagnosis not present

## 2018-10-26 DIAGNOSIS — I110 Hypertensive heart disease with heart failure: Secondary | ICD-10-CM | POA: Insufficient documentation

## 2018-10-26 DIAGNOSIS — I509 Heart failure, unspecified: Secondary | ICD-10-CM | POA: Diagnosis not present

## 2018-10-26 DIAGNOSIS — I480 Paroxysmal atrial fibrillation: Secondary | ICD-10-CM | POA: Diagnosis not present

## 2018-10-26 DIAGNOSIS — L97812 Non-pressure chronic ulcer of other part of right lower leg with fat layer exposed: Secondary | ICD-10-CM | POA: Diagnosis not present

## 2018-10-29 ENCOUNTER — Encounter: Payer: Self-pay | Admitting: Cardiology

## 2018-10-29 NOTE — Progress Notes (Signed)
Remote ICD transmission.   

## 2018-11-02 ENCOUNTER — Ambulatory Visit (INDEPENDENT_AMBULATORY_CARE_PROVIDER_SITE_OTHER): Payer: Medicare Other | Admitting: *Deleted

## 2018-11-02 DIAGNOSIS — I480 Paroxysmal atrial fibrillation: Secondary | ICD-10-CM | POA: Diagnosis not present

## 2018-11-02 DIAGNOSIS — Z5181 Encounter for therapeutic drug level monitoring: Secondary | ICD-10-CM

## 2018-11-02 DIAGNOSIS — L97812 Non-pressure chronic ulcer of other part of right lower leg with fat layer exposed: Secondary | ICD-10-CM | POA: Diagnosis not present

## 2018-11-02 LAB — POCT INR: INR: 3 (ref 2.0–3.0)

## 2018-11-02 NOTE — Patient Instructions (Signed)
Gums bled a little this morning brushing teeth Take coumadin 1/2 tablet tonight then resume 1 tablet daily except 1 1/2 tablets on Sundays, Tuesdays and Thursdays Make sure you eat your greens/salads Recheck in 4 weeks

## 2018-11-07 ENCOUNTER — Other Ambulatory Visit: Payer: Self-pay | Admitting: Internal Medicine

## 2018-11-30 ENCOUNTER — Other Ambulatory Visit: Payer: Self-pay

## 2018-11-30 ENCOUNTER — Ambulatory Visit (INDEPENDENT_AMBULATORY_CARE_PROVIDER_SITE_OTHER): Payer: Medicare Other | Admitting: *Deleted

## 2018-11-30 DIAGNOSIS — I480 Paroxysmal atrial fibrillation: Secondary | ICD-10-CM

## 2018-11-30 DIAGNOSIS — I4821 Permanent atrial fibrillation: Secondary | ICD-10-CM | POA: Diagnosis not present

## 2018-11-30 DIAGNOSIS — Z5181 Encounter for therapeutic drug level monitoring: Secondary | ICD-10-CM | POA: Diagnosis not present

## 2018-11-30 LAB — POCT INR: INR: 2.2 (ref 2.0–3.0)

## 2018-11-30 NOTE — Patient Instructions (Signed)
Continue coumadin 1 tablet daily except 1 1/2 tablets on Sundays, Tuesdays and Thursdays Make sure you eat your greens/salads Recheck in 5 weeks

## 2018-12-27 ENCOUNTER — Ambulatory Visit: Payer: Medicare Other | Admitting: Cardiology

## 2019-01-03 ENCOUNTER — Encounter: Payer: Self-pay | Admitting: Internal Medicine

## 2019-01-03 ENCOUNTER — Other Ambulatory Visit: Payer: Self-pay

## 2019-01-03 ENCOUNTER — Ambulatory Visit (INDEPENDENT_AMBULATORY_CARE_PROVIDER_SITE_OTHER): Payer: Medicare Other | Admitting: Internal Medicine

## 2019-01-03 VITALS — BP 140/74 | HR 81 | Temp 96.6°F | Ht 60.0 in | Wt 167.0 lb

## 2019-01-03 DIAGNOSIS — I472 Ventricular tachycardia: Secondary | ICD-10-CM

## 2019-01-03 DIAGNOSIS — I4819 Other persistent atrial fibrillation: Secondary | ICD-10-CM

## 2019-01-03 DIAGNOSIS — I4729 Other ventricular tachycardia: Secondary | ICD-10-CM

## 2019-01-03 NOTE — Patient Instructions (Signed)
Medication Instructions: Your physician recommends that you continue on your current medications as directed. Please refer to the Current Medication list given to you today. Labwork: none  Procedures/Testing: none  Follow-Up: 1 year with Dr.Taylor  Any Additional Special Instructions Will Be Listed Below (If Applicable).     If you need a refill on your cardiac medications before your next appointment, please call your pharmacy.     Thank you for choosing  [facility] !        

## 2019-01-03 NOTE — Progress Notes (Signed)
HPI Mrs. Dimmitt returns today for followup. She is a pleasant 85 yo woman with a h/o chronic systolic heart failure, persistent atrial fib, CHB, s/p PPM, VT, s/p ICD and asthma. She has done better in the past 2-3 months. She does not feel palpitations but her symptoms are improved. No ICD shock. Allergies  Allergen Reactions  . Codeine Nausea And Vomiting  . Keflex [Cephalexin] Itching and Rash     Current Outpatient Medications  Medication Sig Dispense Refill  . acetaminophen (TYLENOL) 500 MG tablet Take 500 mg by mouth every morning.     [name] albuterol (PROVENTIL HFA;VENTOLIN HFA) 108 (90 BASE) MCG/ACT inhaler Inhale 2 puffs into the lungs every 6 (six) hours as needed for wheezing or shortness of breath. 1 Inhaler 2  . amLODipine (NORVASC) 5 MG tablet Take 1 tablet (5 mg total) by mouth daily. 90 tablet 3  . atorvastatin (LIPITOR) 20 MG tablet TAKE ONE TABLET BY MOUTH ONCE DAILY. (Patient taking differently: Take 20 mg by mouth daily. ) 90 tablet 3  . Calcium Carbonate-Vitamin D (CALCIUM 600 + D PO) Take 1 tablet by mouth 2 (two) times daily.    . Cholecalciferol (VITAMIN D) 2000 UNITS CAPS Take 1 capsule by mouth at bedtime.     . dofetilide (TIKOSYN) 125 MCG capsule TAKE (1) CAPSULE BY MOUTH TWICE DAILY. 99991111 capsule 3  . folic acid (FOLVITE) 1 MG tablet Take 1 mg by mouth 2 (two) times daily.     [name] losartan (COZAAR) 100 MG tablet TAKE ONE TABLET BY MOUTH DAILY. 90 tablet 3  . metoprolol succinate (TOPROL-XL) 100 MG 24 hr tablet TAKE ONE TABLET BY MOUTH EVERY MORNING. TAKE WITH OR IMMEDIATELY FOLLOWING A MEAL. 90 tablet 3  . mupirocin ointment (BACTROBAN) 2 %     . Polyethyl Glycol-Propyl Glycol (SYSTANE OP) Place 1 drop into both eyes daily as needed (Dry eyes).    . potassium chloride SA (K-DUR,KLOR-CON) 20 MEQ tablet Take 20 mEq by mouth 3 (three) times daily.    [name] torsemide (DEMADEX) 20 MG tablet Take 4 tablets (80 mg total) by mouth daily. 360 tablet 3  . warfarin (COUMADIN)  2.5 MG tablet Take 1-1.5 tablets (2.5-3.75 mg total) by mouth as directed. Take 1 1/2 tablets daily except 1 tablet on Mondays and Thursdays. (Patient taking differently: Take 2.5-3.75 mg by mouth as directed. Take 3.75mg  daily except 2.5mg  on Mondays, Wednesday and Friday.) 60 tablet 5   No current facility-administered medications for this visit.      Past Medical History:  Diagnosis Date  . Atrial fibrillation ([place])    a. anticoagulated with Warfarin  . CHF (congestive heart failure) ([place])   . Chronic back pain   . Chronic diastolic heart failure ([place])   . Complete heart block ([place])   . COPD (chronic obstructive pulmonary disease) ([place])   . Coronary atherosclerosis of native coronary artery    Multivessel status post CABG, DES PLA March 2006  . DDD (degenerative disc disease), lumbar   . Essential hypertension, benign   . Mixed hyperlipidemia   . Osteoarthritis   . Ventricular fibrillation ([place]) 2003   a. seen on PPM interrogation a/w syncope    ROS:   All systems reviewed and negative except as noted in the HPI.   Past Surgical History:  Procedure Laterality Date  . CARDIAC DEFIBRILLATOR PLACEMENT     MDT dual chamber ICD  . CARDIOVERSION N/A 08/09/2014   Procedure: CARDIOVERSION;  Surgeon:  Sanda Klein, MD;  Location: [facility];  Service: Cardiovascular;  Laterality: N/A;  . CORONARY ARTERY BYPASS GRAFT     LIMA to LAD, SVG to diagonal, SVG to ramus and OM  . IMPLANTABLE CARDIOVERTER DEFIBRILLATOR GENERATOR CHANGE N/A 09/25/2013   Procedure: IMPLANTABLE CARDIOVERTER DEFIBRILLATOR GENERATOR CHANGE;  Surgeon: Evans Lance, MD;  Location[facility] CATH LAB;  Service: Cardiovascular;  Laterality: N/A;  . LEAD REVISION N/A 09/29/2013   Procedure: LEAD REVISION;  Surgeon: Deboraha Sprang, MD;  Location: [facility] CATH LAB;  Service: Cardiovascular;  Laterality: N/A;  . TONSILLECTOMY    . YAG LASER APPLICATION Left [id number]   Procedure: YAG LASER APPLICATION;  Surgeon: Elta Guadeloupe T. Gershon Crane,  MD;  Location: [facility];  Service: Ophthalmology;  Laterality: Left;  . YAG LASER APPLICATION Right [id number]   Procedure: YAG LASER APPLICATION;  Surgeon: Elta Guadeloupe T. Gershon Crane, MD;  Location: [facility];  Service: Ophthalmology;  Laterality: Right;     Family History  Problem Relation Age of Onset  . Heart attack Father   . Heart attack Brother      Social History   Socioeconomic History  . Marital status: Widowed    Spouse name: Not on file  . Number of children: 2  . Years of education: Not on file  . Highest education level: Not on file  Occupational History  . Occupation: Retired    Comment: Surveyor, quantity: RETIRED  Social Needs  . Financial resource strain: Not on file  . Food insecurity    Worry: Not on file    Inability: Not on file  . Transportation needs    Medical: Not on file    Non-medical: Not on file  Tobacco Use  . Smoking status: Never Smoker  . Smokeless tobacco: Never Used  Substance and Sexual Activity  . Alcohol use: No    Alcohol/week: 0.0 standard drinks  . Drug use: No  . Sexual activity: Never  Lifestyle  . Physical activity    Days per week: Not on file    Minutes per session: Not on file  . Stress: Not on file  Relationships  . Social Herbalist on phone: Not on file    Gets together: Not on file    Attends religious service: Not on file    Active member of club or organization: Not on file    Attends meetings of clubs or organizations: Not on file    Relationship status: Not on file  . Intimate partner violence    Fear of current or ex partner: Not on file    Emotionally abused: Not on file    Physically abused: Not on file    Forced sexual activity: Not on file  Other Topics Concern  . Not on file  Social History Narrative  . Not on file     BP 140/74   Pulse 81   Temp (!) 96.6 F (35.9 C) (Temporal)   Ht 5' (1.524 m)   Wt 167 lb (75.8 kg)   SpO2 96%   BMI 32.61 kg/m   Physical Exam:  Well  appearing 85 yo woman, NAD HEENT: Unremarkable Neck:  6 cm JVD, no thyromegally Lymphatics:  No adenopathy Back:  No CVA tenderness Lungs:  Clear with no wheezes HEART:  Regular rate rhythm, no murmurs, no rubs, no clicks Abd:  soft, positive bowel sounds, no organomegally, no rebound, no guarding Ext:  2 plus pulses, no edema, no cyanosis, no  clubbing Skin:  No rashes no nodules Neuro:  CN II through XII intact, motor grossly intact  DEVICE  Normal device function.  See PaceArt for details.   Assess/Plan: 1. Chronic systolic heart failure - her symptoms are class 2 in NSR and class 3 in atrial fib. She will continue her current meds. 2. Persistent atrial fib - she has maintained NSR mostly over the past 3 months. She will continue dofetilide. 3. VT/VF - she is without recent ICD therapies. She will continue her current meds. 4. ICD - she is over 2 years from ERI. Her device check today demonstrates normal device function.   Mikle Bosworth.D.

## 2019-01-04 ENCOUNTER — Ambulatory Visit (INDEPENDENT_AMBULATORY_CARE_PROVIDER_SITE_OTHER): Payer: Medicare Other | Admitting: *Deleted

## 2019-01-04 DIAGNOSIS — I480 Paroxysmal atrial fibrillation: Secondary | ICD-10-CM

## 2019-01-04 DIAGNOSIS — Z5181 Encounter for therapeutic drug level monitoring: Secondary | ICD-10-CM

## 2019-01-04 LAB — POCT INR: INR: 2.6 (ref 2.0–3.0)

## 2019-01-04 NOTE — Patient Instructions (Signed)
Continue coumadin 1 tablet daily except 1 1/2 tablets on Sundays, Tuesdays and Thursdays Make sure you eat your greens/salads Recheck in 6 weeks

## 2019-01-16 ENCOUNTER — Ambulatory Visit (INDEPENDENT_AMBULATORY_CARE_PROVIDER_SITE_OTHER): Payer: Medicare Other | Admitting: *Deleted

## 2019-01-16 DIAGNOSIS — I480 Paroxysmal atrial fibrillation: Secondary | ICD-10-CM

## 2019-01-16 DIAGNOSIS — I472 Ventricular tachycardia: Secondary | ICD-10-CM

## 2019-01-16 DIAGNOSIS — I4729 Other ventricular tachycardia: Secondary | ICD-10-CM

## 2019-01-16 LAB — CUP PACEART REMOTE DEVICE CHECK
Battery Remaining Longevity: 26 mo
Battery Voltage: 2.93 V
Brady Statistic AP VP Percent: 85.44 %
Brady Statistic AP VS Percent: 0.01 %
Brady Statistic AS VP Percent: 14.54 %
Brady Statistic AS VS Percent: 0.01 %
Brady Statistic RA Percent Paced: 84.9 %
Brady Statistic RV Percent Paced: 99.86 %
Date Time Interrogation Session: 20200928071605
HighPow Impedance: 61 Ohm
HighPow Impedance: 79 Ohm
Implantable Lead Implant Date: 20030823
Implantable Lead Implant Date: 20030823
Implantable Lead Location: 753859
Implantable Lead Location: 753860
Implantable Lead Model: 157
Implantable Lead Model: 4086
Implantable Lead Serial Number: 108215
Implantable Lead Serial Number: 112190
Implantable Pulse Generator Implant Date: 20150608
Lead Channel Impedance Value: 342 Ohm
Lead Channel Impedance Value: 361 Ohm
Lead Channel Impedance Value: 399 Ohm
Lead Channel Pacing Threshold Amplitude: 0.75 V
Lead Channel Pacing Threshold Amplitude: 1.125 V
Lead Channel Pacing Threshold Pulse Width: 0.4 ms
Lead Channel Pacing Threshold Pulse Width: 0.4 ms
Lead Channel Sensing Intrinsic Amplitude: 10.625 mV
Lead Channel Sensing Intrinsic Amplitude: 10.625 mV
Lead Channel Sensing Intrinsic Amplitude: 2.75 mV
Lead Channel Sensing Intrinsic Amplitude: 2.75 mV
Lead Channel Setting Pacing Amplitude: 2 V
Lead Channel Setting Pacing Amplitude: 2.5 V
Lead Channel Setting Pacing Pulse Width: 0.4 ms
Lead Channel Setting Sensing Sensitivity: 0.3 mV

## 2019-01-25 NOTE — Progress Notes (Signed)
Remote ICD transmission.   

## 2019-02-06 ENCOUNTER — Encounter: Payer: Self-pay | Admitting: Cardiology

## 2019-02-06 ENCOUNTER — Other Ambulatory Visit: Payer: Self-pay

## 2019-02-06 ENCOUNTER — Ambulatory Visit: Payer: Medicare Other | Admitting: Cardiology

## 2019-02-06 VITALS — BP 146/69 | HR 85 | Temp 96.6°F | Ht 60.0 in | Wt 167.0 lb

## 2019-02-06 DIAGNOSIS — I25119 Atherosclerotic heart disease of native coronary artery with unspecified angina pectoris: Secondary | ICD-10-CM

## 2019-02-06 DIAGNOSIS — Z95810 Presence of automatic (implantable) cardiac defibrillator: Secondary | ICD-10-CM | POA: Diagnosis not present

## 2019-02-06 DIAGNOSIS — I5032 Chronic diastolic (congestive) heart failure: Secondary | ICD-10-CM

## 2019-02-06 DIAGNOSIS — I480 Paroxysmal atrial fibrillation: Secondary | ICD-10-CM

## 2019-02-06 DIAGNOSIS — Z79899 Other long term (current) drug therapy: Secondary | ICD-10-CM

## 2019-02-06 NOTE — Patient Instructions (Signed)
Medication Instructions:  Your physician recommends that you continue on your current medications as directed. Please refer to the Current Medication list given to you today.  *If you need a refill on your cardiac medications before your next appointment, please call your pharmacy*  Lab Work: CBC,BMET now If you have labs (blood work) drawn today and your tests are completely normal, you will receive your results only by: [name] MyChart Message (if you have MyChart) OR . A paper copy in the mail If you have any lab test that is abnormal or we need to change your treatment, we will call you to review the results.  Testing/Procedures: None  Follow-Up: At [facility], you and your health needs are our priority.  As part of our continuing mission to provide you with exceptional heart care, we have created designated Provider Care Teams.  These Care Teams include your primary Cardiologist (physician) and Advanced Practice Providers (APPs -  Physician Assistants and Nurse Practitioners) who all work together to provide you with the care you need, when you need it.  Your next appointment:   3 months  The format for your next appointment:   In Person  Provider:   Rozann Lesches, MD  Other Instructions None     Thank you for choosing [place] !

## 2019-02-06 NOTE — Progress Notes (Signed)
Cardiology Office Note  Date: 02/06/2019   ID: [name] [name], [name] [name] 1931-06-30, MRN [id number]  PCP:  Sinda Du, MD  Cardiologist:  Rozann Lesches, MD Electrophysiologist:  [name], MD   Chief Complaint  Patient presents with  . Cardiac follow-up    History of Present Illness: [name] [name] is an 85 y.o. female last assessed via telehealth encounter in June.  She presents for a routine follow-up visit.  No change in chronic dyspnea on exertion.  She has not experienced any chest pain or obvious palpitations.  No falls or syncope.  She continues to see Dr. Lovena Le, Medtronic ICD in place.  Last device interrogation showed normal function with 2 episodes of atrial fibrillation, the longest of which was only 3 minutes.  One episode of NSVT.  She is on Coumadin with follow-up in the anticoagulation clinic.  Last INR was 2.6.  I did talk with her some today about considering a switch to Eliquis, presuming this is not cost prohibitive.  She is already on Tikosyn which has a high co-pay.  She will talk with Lattie Haw about this the next time she is in Coumadin clinic.  We discussed obtaining follow-up lab work.  Past Medical History:  Diagnosis Date  . Atrial fibrillation ([place])    a. anticoagulated with Warfarin  . CHF (congestive heart failure) ([place])   . Chronic back pain   . Chronic diastolic heart failure ([place])   . Complete heart block ([place])   . COPD (chronic obstructive pulmonary disease) ([place])   . Coronary atherosclerosis of native coronary artery    Multivessel status post CABG, DES PLA March 2006  . DDD (degenerative disc disease), lumbar   . Essential hypertension, benign   . Mixed hyperlipidemia   . Osteoarthritis   . Ventricular fibrillation ([place]) 2003   a. seen on PPM interrogation a/w syncope    Past Surgical History:  Procedure Laterality Date  . CARDIAC DEFIBRILLATOR PLACEMENT     MDT dual chamber ICD  . CARDIOVERSION N/A 08/09/2014   Procedure:  CARDIOVERSION;  Surgeon: Sanda Klein, MD;  [facility];  Service: Cardiovascular;  Laterality: N/A;  . CORONARY ARTERY BYPASS GRAFT     LIMA to LAD, SVG to diagonal, SVG to ramus and OM  . IMPLANTABLE CARDIOVERTER DEFIBRILLATOR GENERATOR CHANGE N/A 09/25/2013   Procedure: IMPLANTABLE CARDIOVERTER DEFIBRILLATOR GENERATOR CHANGE;  Surgeon: Evans Lance, MD;  Location: [name] CATH LAB;  Service: Cardiovascular;  Laterality: N/A;  . LEAD REVISION N/A 09/29/2013   Procedure: LEAD REVISION;  Surgeon: Deboraha Sprang, MD;  Location: [facility] CATH LAB;  Service: Cardiovascular;  Laterality: N/A;  . TONSILLECTOMY    . YAG LASER APPLICATION Left [id number]   Procedure: YAG LASER APPLICATION;  Surgeon: Elta Guadeloupe T. Gershon Crane, MD;  Location: [facility] OR[facility];  Service: Ophthalmology;  Laterality: Left;  . YAG LASER APPLICATION Right 0000000   Procedure: YAG LASER APPLICATION;  Surgeon: Elta Guadeloupe T. Gershon Crane, MD;  Location: [facility] ORS;  Service: Ophthalmology;  Laterality: Right;    Current Outpatient Medications  Medication Sig Dispense Refill  . acetaminophen (TYLENOL) 500 MG tablet Take 500 mg by mouth every morning.     [name] albuterol (PROVENTIL HFA;VENTOLIN HFA) 108 (90 BASE) MCG/ACT inhaler Inhale 2 puffs into the lungs every 6 (six) hours as needed for wheezing or shortness of breath. 1 Inhaler 2  . amLODipine (NORVASC) 5 MG tablet Take 1 tablet (5 mg total) by mouth daily. 90 tablet 3  . atorvastatin (LIPITOR) 20  MG tablet TAKE ONE TABLET BY MOUTH ONCE DAILY. (Patient taking differently: Take 20 mg by mouth daily. ) 90 tablet 3  . Calcium Carbonate-Vitamin D (CALCIUM 600 + D PO) Take 1 tablet by mouth 2 (two) times daily.    . Cholecalciferol (VITAMIN D) 2000 UNITS CAPS Take 1 capsule by mouth at bedtime.     . dofetilide (TIKOSYN) 125 MCG capsule TAKE (1) CAPSULE BY MOUTH TWICE DAILY. [id number] capsule 3  . folic acid (FOLVITE) 1 MG tablet Take 1 mg by mouth 2 (two) times daily.     [name] losartan (COZAAR) 100 MG tablet TAKE ONE  TABLET BY MOUTH DAILY. 90 tablet 3  . metoprolol succinate (TOPROL-XL) 100 MG 24 hr tablet TAKE ONE TABLET BY MOUTH EVERY MORNING. TAKE WITH OR IMMEDIATELY FOLLOWING A MEAL. 90 tablet 3  . mupirocin ointment (BACTROBAN) 2 %     . Polyethyl Glycol-Propyl Glycol (SYSTANE OP) Place 1 drop into both eyes daily as needed (Dry eyes).    . potassium chloride SA (K-DUR,KLOR-CON) 20 MEQ tablet Take 20 mEq by mouth 3 (three) times daily.    [name] torsemide (DEMADEX) 20 MG tablet Take 4 tablets (80 mg total) by mouth daily. 360 tablet 3  . warfarin (COUMADIN) 2.5 MG tablet Take 1-1.5 tablets (2.5-3.75 mg total) by mouth as directed. Take 1 1/2 tablets daily except 1 tablet on Mondays and Thursdays. (Patient taking differently: Take 2.5-3.75 mg by mouth as directed. Take 3.75mg  daily except 2.5mg  on Mondays, Wednesday and Friday.) 60 tablet 5   No current facility-administered medications for this visit.    Allergies:  Codeine and Keflex [cephalexin]   Social History: The patient  reports that she has never smoked. She has never used smokeless tobacco. She reports that she does not drink alcohol or use drugs.   ROS:  Please see the history of present illness. Otherwise, complete review of systems is positive for hearing and memory loss.  All other systems are reviewed and negative.   Physical Exam: VS:  BP (!) 146/69   Pulse 85   Temp (!) 96.6 F (35.9 C)   Ht 5' (1.524 m)   Wt 167 lb (75.8 kg)   SpO2 91%   BMI 32.61 kg/m , BMI Body mass index is 32.61 kg/m.  Wt Readings from Last 3 Encounters:  02/06/19 167 lb (75.8 kg)  01/03/19 167 lb (75.8 kg)  09/26/18 167 lb (75.8 kg)    General: Elderly woman, appears comfortable at rest. HEENT: Conjunctiva and lids normal, wearing a mask. Neck: Supple, no elevated JVP or carotid bruits, no thyromegaly. Lungs: Clear to auscultation, nonlabored breathing at rest. Cardiac: Regular rate and rhythm, no S3, 2/6 systolic murmur. Abdomen: Soft, nontender, bowel  sounds present. Extremities: Chronic appearing mild lower leg edema and venous stasis, distal pulses 1-2+. Skin: Warm and dry. Musculoskeletal: No kyphosis. Neuropsychiatric: Alert and oriented x3, affect grossly appropriate.  ECG:  An ECG dated 06/22/2018 was personally reviewed today and demonstrated:  Ventricular paced rhythm with underlying atrial fibrillation.  Recent Labwork: 07/06/2018: BUN 32; Creatinine, Ser 1.39; Potassium 4.1; Sodium 146     Component Value Date/Time   CHOL 111 01/24/2013 0946   TRIG 119 01/24/2013 0946   HDL 31 (L) 01/24/2013 0946   CHOLHDL 3.6 01/24/2013 0946   VLDL 24 01/24/2013 0946   LDLCALC 56 01/24/2013 0946    Other Studies Reviewed Today:  Echocardiogram 09/13/2013: Study Conclusions  - Left ventricle: The cavity size was normal. Wall thickness  was increased in a pattern of mild LVH. Systolic function was normal. The estimated ejection fraction was in the range of 60% to 65%. Wall motion was normal; there were no regional wall motion abnormalities. Features are consistent with a pseudonormal left ventricular filling pattern, with concomitant abnormal relaxation and increased filling pressure (grade 2 diastolic dysfunction). Doppler parameters are consistent with elevated ventricular end-diastolic filling pressure. - Aortic valve: Mildly calcified annulus. Trileaflet; mildly calcified leaflets. There was very mild stenosis. There was no significant regurgitation. Mean gradient (S): 9 mm Hg. Valve area (VTI): 1.52 cm^2. Valve area (Vmax): 1.71 cm^2. - Mitral valve: Calcified annulus. Mildly thickened leaflets . There was mild regurgitation. - Left atrium: The atrium was mildly to moderately dilated. - Right ventricle: The cavity size was mildly dilated. Pacer wire or catheter noted in right ventricle. - Right atrium: Central venous pressure (est): 3 mm Hg. - Atrial septum: No defect or patent foramen ovale was  identified. - Tricuspid valve: There was mild regurgitation. - Pulmonary arteries: PA peak pressure: 42 mm Hg (S). - Pericardium, extracardiac: There was no pericardial effusion.  Impressions:  - Mild LVH with LVEF 123456, grade 2 diastolic dysfunction with increased filling pressures. MIld to moderate left atrial enlargement. Mild mitral regurgitation. Very mildly stenotic aortic valve. Device wire noted in right heart. Mild tricuspid regurgitation with PASP 42 mmHg.  Lexiscan Cardiolite 06/15/2014: FINDINGS: Pharmacological stress  Baseline EKG shows ventricular pacing. After injection heart rate increased from 61 beats per min up to 75 beats per min, and blood pressure decreased from 139/76 and 113/66. The test was stopped after injection was complete, the patient did not experience any chest pain. Post-injection EKG showed no significant arrhythmias, findings not interpretable for ischemia because she was ventricular paced throughout the study.  Perfusion: No decreased activity in the left ventricle on stress imaging to suggest reversible ischemia or infarction.  Wall Motion: Normal left ventricular wall motion. No left ventricular dilation.  Left Ventricular Ejection Fraction: 77 %  End diastolic volume 55 ml  End systolic volume 12 ml  IMPRESSION: 1. No reversible ischemia or infarction.  2. Normal left ventricular wall motion.  3. Left ventricular ejection fraction 77%  4. Low-risk stress test findings*.  Assessment and Plan:  1.  Atrial fibrillation.  She has had good rhythm control based on device interrogations and continues on Tikosyn, Toprol-XL, and Coumadin.  I did talk with her about considering a switch to Eliquis.  Will obtain follow-up lab work and she can review this further with Lattie Haw at her next Coumadin clinic visit.  2.  Medtronic ICD in place.  She continues to follow with Dr. Lovena Le.  No device shocks.  She has normal LVEF at  this point.  3.  CAD status post CABG and DES to the PLA.  No active angina symptoms.  She is not on aspirin given use of Coumadin.  Continue statin therapy.  4.  Essential hypertension, no changes to present regimen.  Medication Adjustments/Labs and Tests Ordered: Current medicines are reviewed at length with the patient today.  Concerns regarding medicines are outlined above.   Tests Ordered: Orders Placed This Encounter  Procedures  . CBC  . Basic Metabolic Panel (BMET)    Medication Changes: No orders of the defined types were placed in this encounter.   Disposition:  Follow up 3 months in the [place] office.  Signed, Satira Sark, MD, [facility] 02/06/2019 3:43 PM    [place] at  [place] [address] [place], [place] [address] Phone: [phone]; Fax: [phone]

## 2019-02-14 ENCOUNTER — Telehealth: Payer: Self-pay | Admitting: Cardiology

## 2019-02-14 NOTE — Telephone Encounter (Signed)
Left message asking for cathy  To call

## 2019-02-14 NOTE — Telephone Encounter (Signed)
Clarified where she would get labs, will get done at [facility] lab

## 2019-02-15 ENCOUNTER — Ambulatory Visit (INDEPENDENT_AMBULATORY_CARE_PROVIDER_SITE_OTHER): Payer: Medicare Other | Admitting: *Deleted

## 2019-02-15 ENCOUNTER — Other Ambulatory Visit: Payer: Self-pay

## 2019-02-15 ENCOUNTER — Other Ambulatory Visit (HOSPITAL_COMMUNITY)
Admission: RE | Admit: 2019-02-15 | Discharge: 2019-02-15 | Disposition: A | Discharge status: Home / Self Care | Payer: Medicare Other | Source: Ambulatory Visit | Attending: Cardiology | Admitting: Cardiology

## 2019-02-15 DIAGNOSIS — I5032 Chronic diastolic (congestive) heart failure: Secondary | ICD-10-CM | POA: Insufficient documentation

## 2019-02-15 DIAGNOSIS — I480 Paroxysmal atrial fibrillation: Secondary | ICD-10-CM | POA: Insufficient documentation

## 2019-02-15 DIAGNOSIS — Z5181 Encounter for therapeutic drug level monitoring: Secondary | ICD-10-CM | POA: Diagnosis not present

## 2019-02-15 DIAGNOSIS — I4891 Unspecified atrial fibrillation: Secondary | ICD-10-CM | POA: Diagnosis not present

## 2019-02-15 DIAGNOSIS — I25119 Atherosclerotic heart disease of native coronary artery with unspecified angina pectoris: Secondary | ICD-10-CM | POA: Diagnosis present

## 2019-02-15 DIAGNOSIS — Z79899 Other long term (current) drug therapy: Secondary | ICD-10-CM | POA: Diagnosis present

## 2019-02-15 LAB — CBC
HCT: 40 % (ref 36.0–46.0)
Hemoglobin: 12.9 g/dL (ref 12.0–15.0)
MCH: 31.5 pg (ref 26.0–34.0)
MCHC: 32.3 g/dL (ref 30.0–36.0)
MCV: 97.8 fL (ref 80.0–100.0)
Platelets: 187 10*3/uL (ref 150–400)
RBC: 4.09 MIL/uL (ref 3.87–5.11)
RDW: 13.6 % (ref 11.5–15.5)
WBC: 7.9 10*3/uL (ref 4.0–10.5)
nRBC: 0 % (ref 0.0–0.2)

## 2019-02-15 LAB — BASIC METABOLIC PANEL
Anion gap: 10 (ref 5–15)
BUN: 33 mg/dL — ABNORMAL HIGH (ref 8–23)
CO2: 28 mmol/L (ref 22–32)
Calcium: 9.4 mg/dL (ref 8.9–10.3)
Chloride: 103 mmol/L (ref 98–111)
Creatinine, Ser: 1.29 mg/dL — ABNORMAL HIGH (ref 0.44–1.00)
GFR calc Af Amer: 44 mL/min — ABNORMAL LOW (ref >60)
GFR calc non Af Amer: 38 mL/min — ABNORMAL LOW (ref >60)
Glucose, Bld: 113 mg/dL — ABNORMAL HIGH (ref 70–99)
Potassium: 4.1 mmol/L (ref 3.5–5.1)
Sodium: 141 mmol/L (ref 135–145)

## 2019-02-15 LAB — POCT INR: INR: 2.4 (ref 2.0–3.0)

## 2019-02-15 NOTE — Patient Instructions (Signed)
Continue coumadin 1 tablet daily except 1 1/2 tablets on Sundays, Tuesdays and Thursdays Make sure you eat your greens/salads Recheck in 6 weeks

## 2019-03-29 ENCOUNTER — Other Ambulatory Visit: Payer: Self-pay

## 2019-03-29 ENCOUNTER — Ambulatory Visit (INDEPENDENT_AMBULATORY_CARE_PROVIDER_SITE_OTHER): Payer: Medicare Other | Admitting: *Deleted

## 2019-03-29 DIAGNOSIS — I480 Paroxysmal atrial fibrillation: Secondary | ICD-10-CM

## 2019-03-29 DIAGNOSIS — Z5181 Encounter for therapeutic drug level monitoring: Secondary | ICD-10-CM

## 2019-03-29 LAB — POCT INR: INR: 2.6 (ref 2.0–3.0)

## 2019-03-29 NOTE — Patient Instructions (Signed)
Continue coumadin 1 tablet daily except 1 1/2 tablets on Sundays, Tuesdays and Thursdays Make sure you eat your greens/salads Recheck in 6 weeks

## 2019-04-11 ENCOUNTER — Other Ambulatory Visit: Payer: Self-pay | Admitting: Cardiology

## 2019-04-17 ENCOUNTER — Ambulatory Visit (INDEPENDENT_AMBULATORY_CARE_PROVIDER_SITE_OTHER): Payer: Medicare Other | Admitting: *Deleted

## 2019-04-17 DIAGNOSIS — I472 Ventricular tachycardia: Secondary | ICD-10-CM | POA: Diagnosis not present

## 2019-04-17 DIAGNOSIS — I4729 Other ventricular tachycardia: Secondary | ICD-10-CM

## 2019-04-18 LAB — CUP PACEART REMOTE DEVICE CHECK
Battery Remaining Longevity: 21 mo
Battery Voltage: 2.92 V
Brady Statistic AP VP Percent: 73.24 %
Brady Statistic AP VS Percent: 0.01 %
Brady Statistic AS VP Percent: 26.72 %
Brady Statistic AS VS Percent: 0.03 %
Brady Statistic RA Percent Paced: 71.66 %
Brady Statistic RV Percent Paced: 99.85 %
Date Time Interrogation Session: 20201228043825
HighPow Impedance: 58 Ohm
HighPow Impedance: 80 Ohm
Implantable Lead Implant Date: 20030823
Implantable Lead Implant Date: 20030823
Implantable Lead Location: 753859
Implantable Lead Location: 753860
Implantable Lead Model: 157
Implantable Lead Model: 4086
Implantable Lead Serial Number: 108215
Implantable Lead Serial Number: 112190
Implantable Pulse Generator Implant Date: 20150608
Lead Channel Impedance Value: 342 Ohm
Lead Channel Impedance Value: 342 Ohm
Lead Channel Impedance Value: 418 Ohm
Lead Channel Pacing Threshold Amplitude: 0.75 V
Lead Channel Pacing Threshold Amplitude: 1.125 V
Lead Channel Pacing Threshold Pulse Width: 0.4 ms
Lead Channel Pacing Threshold Pulse Width: 0.4 ms
Lead Channel Sensing Intrinsic Amplitude: 15.125 mV
Lead Channel Sensing Intrinsic Amplitude: 15.125 mV
Lead Channel Sensing Intrinsic Amplitude: 2.75 mV
Lead Channel Sensing Intrinsic Amplitude: 2.75 mV
Lead Channel Setting Pacing Amplitude: 2 V
Lead Channel Setting Pacing Amplitude: 2.5 V
Lead Channel Setting Pacing Pulse Width: 0.4 ms
Lead Channel Setting Sensing Sensitivity: 0.3 mV

## 2019-04-22 ENCOUNTER — Other Ambulatory Visit: Payer: Self-pay | Admitting: Cardiology

## 2019-04-23 DIAGNOSIS — Z950 Presence of cardiac pacemaker: Secondary | ICD-10-CM | POA: Insufficient documentation

## 2019-04-23 DIAGNOSIS — I5032 Chronic diastolic (congestive) heart failure: Secondary | ICD-10-CM | POA: Insufficient documentation

## 2019-04-23 DIAGNOSIS — J449 Chronic obstructive pulmonary disease, unspecified: Secondary | ICD-10-CM | POA: Insufficient documentation

## 2019-04-23 DIAGNOSIS — I270 Primary pulmonary hypertension: Secondary | ICD-10-CM | POA: Insufficient documentation

## 2019-04-23 DIAGNOSIS — M1990 Unspecified osteoarthritis, unspecified site: Secondary | ICD-10-CM | POA: Insufficient documentation

## 2019-04-23 DIAGNOSIS — R519 Headache, unspecified: Secondary | ICD-10-CM | POA: Insufficient documentation

## 2019-04-23 DIAGNOSIS — I10 Essential (primary) hypertension: Secondary | ICD-10-CM | POA: Insufficient documentation

## 2019-04-23 DIAGNOSIS — I509 Heart failure, unspecified: Secondary | ICD-10-CM | POA: Insufficient documentation

## 2019-04-23 DIAGNOSIS — F419 Anxiety disorder, unspecified: Secondary | ICD-10-CM | POA: Insufficient documentation

## 2019-04-23 DIAGNOSIS — E785 Hyperlipidemia, unspecified: Secondary | ICD-10-CM | POA: Insufficient documentation

## 2019-04-23 DIAGNOSIS — I482 Chronic atrial fibrillation, unspecified: Secondary | ICD-10-CM

## 2019-04-23 DIAGNOSIS — W19XXXA Unspecified fall, initial encounter: Secondary | ICD-10-CM | POA: Insufficient documentation

## 2019-04-23 DIAGNOSIS — I2510 Atherosclerotic heart disease of native coronary artery without angina pectoris: Secondary | ICD-10-CM | POA: Insufficient documentation

## 2019-05-02 ENCOUNTER — Telehealth: Payer: Self-pay | Admitting: Pharmacist

## 2019-05-02 NOTE — Telephone Encounter (Signed)
Called pt to follow up with medication costs with new insurance plan. 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  Previously discussed changing from warfarin to Eliquis due to improved efficacy and safety profile as well as decreased in-office monitoring especially in setting of COVID pandemic. She was previously concerned about paying for 2 expensive medications as dofetilide cost $128/3 month supply. Looks as though this has improved from Tier 3 to Tier 1 this year so a 3 month supply will cost $24. Pt is due for refill in 2-3 weeks so the pharmacy can confirm this pricing then.  Advised her that Eliquis is a Tier 2 on her new plan -$40/1 month supply or savings of $80/3 month supply.   She states she would like to think about it. She sees Dr Domenic Polite tomorrow and will discuss with him. Plan to call pt back next month after she has confirmed that her dofetilide cost has decreased notably from last year and can revisit again then.

## 2019-05-03 ENCOUNTER — Encounter: Payer: Self-pay | Admitting: Cardiology

## 2019-05-03 ENCOUNTER — Ambulatory Visit (INDEPENDENT_AMBULATORY_CARE_PROVIDER_SITE_OTHER): Payer: Medicare PPO | Admitting: Cardiology

## 2019-05-03 VITALS — BP 138/77 | HR 80 | Temp 98.0°F | Ht 60.0 in | Wt 168.0 lb

## 2019-05-03 DIAGNOSIS — I25119 Atherosclerotic heart disease of native coronary artery with unspecified angina pectoris: Secondary | ICD-10-CM

## 2019-05-03 DIAGNOSIS — I4819 Other persistent atrial fibrillation: Secondary | ICD-10-CM

## 2019-05-03 DIAGNOSIS — I10 Essential (primary) hypertension: Secondary | ICD-10-CM

## 2019-05-03 NOTE — Patient Instructions (Signed)
Medication Instructions:  Your physician recommends that you continue on your current medications as directed. Please refer to the Current Medication list given to you today.  *If you need a refill on your cardiac medications before your next appointment, please call your pharmacy*  Lab Work: NONE If you have labs (blood work) drawn today and your tests are completely normal, you will receive your results only by: . MyChart Message (if you have MyChart) OR . A paper copy in the mail If you have any lab test that is abnormal or we need to change your treatment, we will call you to review the results.  Testing/Procedures: NONE  Follow-Up: At [facility], you and your health needs are our priority.  As part of our continuing mission to provide you with exceptional heart care, we have created designated Provider Care Teams.  These Care Teams include your primary Cardiologist (physician) and Advanced Practice Providers (APPs -  Physician Assistants and Nurse Practitioners) who all work together to provide you with the care you need, when you need it.  Your next appointment:   4 month(s)  The format for your next appointment:   In Person  Provider:   Samuel McDowell, MD  Other Instructions NONE      Thank you for choosing [facility] !         

## 2019-05-03 NOTE — Progress Notes (Signed)
Cardiology Office Note  Date: 05/03/2019   ID: Aslan, Crable 20-Dec-1931, MRN [id number]  PCP:  Sinda Du, MD  Cardiologist:  Rozann Lesches, MD Electrophysiologist:  [name], MD   Chief Complaint  Patient presents with  . Cardiac follow-up    History of Present Illness: [name] [name] is an 86 y.o. female last seen in October 2020.  She presents for a follow-up visit.  Reports fluctuating degrees of dyspnea on exertion, seems to be somewhat worse today.  She reports compliance with her medications.  No sense of palpitations or chest pain however.  She follows with Dr. Lovena Le, Medtronic ICD in place.  Most recent device check showed normal function.  She had approximately 18% AF burden.  She was noted to be in atrial flutter at last check on December 31.  She remains on Coumadin with follow-up in anticoagulation clinic.  She has been considering a switch to Eliquis however.  This was reviewed with her recently by our pharmacist.  I discussed this with her again today and she is in agreement.  Will forward to [place] to make adjustment in the anticoagulation clinic at next check.  Past Medical History:  Diagnosis Date  . Anxiety   . Chronic atrial fibrillation ([place])   . Chronic back pain   . Chronic diastolic heart failure ([place])   . Chronic obstructive pulmonary disease, unspecified ([place])   . Complete heart block ([place])   . COPD (chronic obstructive pulmonary disease) ([place])   . Coronary atherosclerosis of native coronary artery    Multivessel status post CABG, DES PLA March 2006  . DDD (degenerative disc disease), lumbar   . Essential hypertension   . Headache   . ICD (implantable cardioverter-defibrillator) in place   . Mixed hyperlipidemia   . Osteoarthritis   . Ventricular fibrillation ([place]) 2003   a. seen on PPM interrogation a/w syncope    Past Surgical History:  Procedure Laterality Date  . CARDIAC DEFIBRILLATOR PLACEMENT     MDT dual chamber ICD  .  CARDIOVERSION N/A 08/09/2014   Procedure: CARDIOVERSION;  Surgeon: Sanda Klein, MD;  [facility]: [facility];  Service: Cardiovascular;  Laterality: N/A;  . CORONARY ARTERY BYPASS GRAFT     LIMA to LAD, SVG to diagonal, SVG to ramus and OM  . IMPLANTABLE CARDIOVERTER DEFIBRILLATOR GENERATOR CHANGE N/A 09/25/2013   Procedure: IMPLANTABLE CARDIOVERTER DEFIBRILLATOR GENERATOR CHANGE;  Surgeon: Evans Lance, MD;  Location: [facility] CATH LAB;  Service: Cardiovascular;  Laterality: N/A;  . LEAD REVISION N/A 09/29/2013   Procedure: LEAD REVISION;  Surgeon: Deboraha Sprang, MD;  Location: [facility] CATH LAB;  Service: Cardiovascular;  Laterality: N/A;  . TONSILLECTOMY    . YAG LASER APPLICATION Left [id number]   Procedure: YAG LASER APPLICATION;  Surgeon: Elta Guadeloupe T. Gershon Crane, MD;  Location: [facility] ORS;  Service: Ophthalmology;  Laterality: Left;  . YAG LASER APPLICATION Right 0000000   Procedure: YAG LASER APPLICATION;  Surgeon: Elta Guadeloupe T. Gershon Crane, MD;  Location: [facility] ORS;  Service: Ophthalmology;  Laterality: Right;    Current Outpatient Medications  Medication Sig Dispense Refill  . acetaminophen (TYLENOL) 500 MG tablet Take 500 mg by mouth every morning.     [name] albuterol (PROVENTIL HFA;VENTOLIN HFA) 108 (90 BASE) MCG/ACT inhaler Inhale 2 puffs into the lungs every 6 (six) hours as needed for wheezing or shortness of breath. 1 Inhaler 2  . amLODipine (NORVASC) 5 MG tablet TAKE (1) TABLET BY MOUTH ONCE DAILY. 30 tablet 0  .  atorvastatin (LIPITOR) 20 MG tablet TAKE ONE TABLET BY MOUTH ONCE DAILY. 90 tablet 0  . Calcium Carbonate-Vitamin D (CALCIUM 600 + D PO) Take 1 tablet by mouth 2 (two) times daily.    . Cholecalciferol (VITAMIN D) 2000 UNITS CAPS Take 1 capsule by mouth at bedtime.     . dofetilide (TIKOSYN) 125 MCG capsule TAKE (1) CAPSULE BY MOUTH TWICE DAILY. 99991111 capsule 3  . folic acid (FOLVITE) 1 MG tablet Take 1 mg by mouth 2 (two) times daily.     . furosemide (LASIX) 20 MG tablet Take 40 mg by mouth daily.    .  furosemide (LASIX) 40 MG tablet Take 80 mg by mouth daily.    [name] losartan (COZAAR) 100 MG tablet TAKE ONE TABLET BY MOUTH DAILY. 90 tablet 0  . losartan-hydrochlorothiazide (HYZAAR) 100-12.5 MG tablet Take 1 tablet by mouth daily.    . methocarbamol (ROBAXIN) 500 MG tablet Take 500 mg by mouth 2 (two) times daily as needed for muscle spasms.    . metoprolol (TOPROL-XL) 200 MG 24 hr tablet Take 200 mg by mouth daily.    . metoprolol succinate (TOPROL-XL) 100 MG 24 hr tablet TAKE ONE TABLET BY MOUTH EVERY MORNING. TAKE WITH OR IMMEDIATELY FOLLOWING A MEAL. 90 tablet 0  . mupirocin ointment (BACTROBAN) 2 %     . Polyethyl Glycol-Propyl Glycol (SYSTANE OP) Place 1 drop into both eyes daily as needed (Dry eyes).    . potassium chloride SA (K-DUR,KLOR-CON) 20 MEQ tablet Take 20 mEq by mouth 3 (three) times daily.    [name] torsemide (DEMADEX) 20 MG tablet Take 4 tablets (80 mg total) by mouth daily. 360 tablet 3  . warfarin (COUMADIN) 2.5 MG tablet Take 1-1.5 tablets (2.5-3.75 mg total) by mouth as directed. Take 1 1/2 tablets daily except 1 tablet on Mondays and Thursdays. (Patient taking differently: Take 2.5-3.75 mg by mouth as directed. Take 3.75mg  daily except 2.5mg  on Mondays, Wednesday and Friday.) 60 tablet 5   No current facility-administered medications for this visit.   Allergies:  Codeine and Keflex [cephalexin]   Social History: The patient  reports that she has never smoked. She has never used smokeless tobacco. She reports that she does not drink alcohol or use drugs.   ROS:  Please see the history of present illness. Otherwise, complete review of systems is positive for none.  All other systems are reviewed and negative.   Physical Exam: VS:  BP 138/77   Pulse 80   Temp 98 F (36.7 C)   Ht 5' (1.524 m)   Wt 168 lb (76.2 kg)   SpO2 92%   BMI 32.81 kg/m , BMI Body mass index is 32.81 kg/m.  Wt Readings from Last 3 Encounters:  05/03/19 168 lb (76.2 kg)  02/06/19 167 lb (75.8 kg)   01/03/19 167 lb (75.8 kg)    General: Elderly woman, no distress. HEENT: Conjunctiva and lids normal, wearing a mask. Neck: Supple, no elevated JVP or carotid bruits, no thyromegaly. Lungs: Clear to auscultation, nonlabored breathing at rest. Cardiac: Regular rate and rhythm, no S3, 2/6 systolic murmur. Abdomen: Soft, nontender, bowel sounds present. Extremities: Mild lower leg edema, distal pulses 2+. Skin: Warm and dry. Musculoskeletal: No kyphosis. Neuropsychiatric: Alert and oriented x3, affect grossly appropriate.  ECG:  An ECG dated 06/22/2018 was personally reviewed today and demonstrated:  Ventricular pacing with underlying atrial fibrillation.  Recent Labwork: 02/15/2019: BUN 33; Creatinine, Ser 1.29; Hemoglobin 12.9; Platelets 187; Potassium 4.1; Sodium  141   Other Studies Reviewed Today:  Echocardiogram 09/13/2013: Study Conclusions  - Left ventricle: The cavity size was normal. Wall thickness was increased in a pattern of mild LVH. Systolic function was normal. The estimated ejection fraction was in the range of 60% to 65%. Wall motion was normal; there were no regional wall motion abnormalities. Features are consistent with a pseudonormal left ventricular filling pattern, with concomitant abnormal relaxation and increased filling pressure (grade 2 diastolic dysfunction). Doppler parameters are consistent with elevated ventricular end-diastolic filling pressure. - Aortic valve: Mildly calcified annulus. Trileaflet; mildly calcified leaflets. There was very mild stenosis. There was no significant regurgitation. Mean gradient (S): 9 mm Hg. Valve area (VTI): 1.52 cm^2. Valve area (Vmax): 1.71 cm^2. - Mitral valve: Calcified annulus. Mildly thickened leaflets . There was mild regurgitation. - Left atrium: The atrium was mildly to moderately dilated. - Right ventricle: The cavity size was mildly dilated. Pacer wire or catheter noted in right  ventricle. - Right atrium: Central venous pressure (est): 3 mm Hg. - Atrial septum: No defect or patent foramen ovale was identified. - Tricuspid valve: There was mild regurgitation. - Pulmonary arteries: PA peak pressure: 42 mm Hg (S). - Pericardium, extracardiac: There was no pericardial effusion.  Impressions:  - Mild LVH with LVEF 123456, grade 2 diastolic dysfunction with increased filling pressures. MIld to moderate left atrial enlargement. Mild mitral regurgitation. Very mildly stenotic aortic valve. Device wire noted in right heart. Mild tricuspid regurgitation with PASP 42 mmHg.  Lexiscan Cardiolite 06/15/2014: FINDINGS: Pharmacological stress  Baseline EKG shows ventricular pacing. After injection heart rate increased from 61 beats per min up to 75 beats per min, and blood pressure decreased from 139/76 and 113/66. The test was stopped after injection was complete, the patient did not experience any chest pain. Post-injection EKG showed no significant arrhythmias, findings not interpretable for ischemia because she was ventricular paced throughout the study.  Perfusion: No decreased activity in the left ventricle on stress imaging to suggest reversible ischemia or infarction.  Wall Motion: Normal left ventricular wall motion. No left ventricular dilation.  Left Ventricular Ejection Fraction: 77 %  End diastolic volume 55 ml  End systolic volume 12 ml  IMPRESSION: 1. No reversible ischemia or infarction.  2. Normal left ventricular wall motion.  3. Left ventricular ejection fraction 77%  4. Low-risk stress test findings*.  Assessment and Plan:  1.  Paroxysmal to persistent atrial fibrillation.  Approximately 18% AF burden by last device check although in atrial flutter at that time.  Plan is to continue current approach with Tikosyn, Toprol-XL, and planned conversion from Coumadin to Eliquis.  2.  Medtronic ICD in place.  No device shocks  or syncope.  Keep follow-up with Dr. Lovena Le.  3.  CAD status post CABG and DES to the PLA.  No active angina symptoms.  She is not on aspirin in the setting of anticoagulation.  Continue beta-blocker, ARB, Norvasc and statin.  4.  Essential hypertension, systolic in the [id number] today.  No changes in current regimen.  Medication Adjustments/Labs and Tests Ordered: Current medicines are reviewed at length with the patient today.  Concerns regarding medicines are outlined above.   Tests Ordered: No orders of the defined types were placed in this encounter.   Medication Changes: No orders of the defined types were placed in this encounter.   Disposition:  Follow up 4 months in the [place] office.  Signed, Satira Sark, MD, [facility] 05/03/2019 5:02 PM  [place] at [facility] [address], [place], [place] [address] Phone: [phone]; Fax: [phone]

## 2019-05-10 ENCOUNTER — Ambulatory Visit: Payer: Medicare Other | Admitting: Cardiology

## 2019-05-10 ENCOUNTER — Other Ambulatory Visit: Payer: Self-pay

## 2019-05-10 ENCOUNTER — Ambulatory Visit (INDEPENDENT_AMBULATORY_CARE_PROVIDER_SITE_OTHER): Payer: Medicare PPO | Admitting: *Deleted

## 2019-05-10 ENCOUNTER — Other Ambulatory Visit: Payer: Self-pay | Admitting: *Deleted

## 2019-05-10 DIAGNOSIS — I4891 Unspecified atrial fibrillation: Secondary | ICD-10-CM | POA: Diagnosis not present

## 2019-05-10 DIAGNOSIS — Z5181 Encounter for therapeutic drug level monitoring: Secondary | ICD-10-CM

## 2019-05-10 DIAGNOSIS — I480 Paroxysmal atrial fibrillation: Secondary | ICD-10-CM | POA: Diagnosis not present

## 2019-05-10 LAB — POCT INR: INR: 2.3 (ref 2.0–3.0)

## 2019-05-10 MED ORDER — APIXABAN 5 MG PO TABS: 5.0000 mg | ORAL_TABLET | Freq: Two times a day (BID) | ORAL | 3 refills | Status: DC

## 2019-05-10 NOTE — Patient Instructions (Addendum)
Here changing from Warfarin to Eliquis Hold coumadin tonight and start Eliquis tonight Take Eliquis 5mg  twice daily 12 hours apart Labs reviewed from 02/15/19:   SCr 1.29  Hgb 12.9  Hct 40.0  Wt 76kg .  Will repeat CBC and BMP in 1 month.

## 2019-05-10 NOTE — Addendum Note (Signed)
Addended by: Malen Gauze on: 05/10/2019 04:29 PM   Modules accepted: Orders

## 2019-06-12 ENCOUNTER — Other Ambulatory Visit: Payer: Self-pay | Admitting: Cardiology

## 2019-06-15 ENCOUNTER — Other Ambulatory Visit: Payer: Self-pay

## 2019-06-15 ENCOUNTER — Other Ambulatory Visit (HOSPITAL_COMMUNITY)
Admission: RE | Admit: 2019-06-15 | Discharge: 2019-06-15 | Disposition: A | Discharge status: Home / Self Care | Payer: Medicare PPO | Source: Ambulatory Visit | Attending: Cardiology | Admitting: Cardiology

## 2019-06-15 DIAGNOSIS — I480 Paroxysmal atrial fibrillation: Secondary | ICD-10-CM | POA: Insufficient documentation

## 2019-06-15 DIAGNOSIS — Z5181 Encounter for therapeutic drug level monitoring: Secondary | ICD-10-CM

## 2019-06-15 LAB — CBC
HCT: 41.2 % (ref 36.0–46.0)
Hemoglobin: 13.5 g/dL (ref 12.0–15.0)
MCH: 32.3 pg (ref 26.0–34.0)
MCHC: 32.8 g/dL (ref 30.0–36.0)
MCV: 98.6 fL (ref 80.0–100.0)
Platelets: 214 10*3/uL (ref 150–400)
RBC: 4.18 MIL/uL (ref 3.87–5.11)
RDW: 13.8 % (ref 11.5–15.5)
WBC: 11.1 10*3/uL — ABNORMAL HIGH (ref 4.0–10.5)
nRBC: 0 % (ref 0.0–0.2)

## 2019-06-15 LAB — BASIC METABOLIC PANEL
Anion gap: 11 (ref 5–15)
BUN: 43 mg/dL — ABNORMAL HIGH (ref 8–23)
CO2: 27 mmol/L (ref 22–32)
Calcium: 9.6 mg/dL (ref 8.9–10.3)
Chloride: 104 mmol/L (ref 98–111)
Creatinine, Ser: 1.57 mg/dL — ABNORMAL HIGH (ref 0.44–1.00)
GFR calc Af Amer: 34 mL/min — ABNORMAL LOW (ref >60)
GFR calc non Af Amer: 30 mL/min — ABNORMAL LOW (ref >60)
Glucose, Bld: 94 mg/dL (ref 70–99)
Potassium: 4.1 mmol/L (ref 3.5–5.1)
Sodium: 142 mmol/L (ref 135–145)

## 2019-06-16 ENCOUNTER — Telehealth: Payer: Self-pay

## 2019-06-16 DIAGNOSIS — Z79899 Other long term (current) drug therapy: Secondary | ICD-10-CM

## 2019-06-16 MED ORDER — TORSEMIDE 20 MG PO TABS: 60.0000 mg | ORAL_TABLET | Freq: Every day | ORAL | 3 refills | Status: DC

## 2019-06-16 NOTE — Telephone Encounter (Signed)
Pt notified, mailed lab sliop

## 2019-06-16 NOTE — Addendum Note (Signed)
Addended by: Barbarann Ehlers A on: 06/16/2019 10:33 AM   Modules accepted: Orders

## 2019-06-16 NOTE — Telephone Encounter (Signed)
She is only taking Torsemide 80 mg daily.

## 2019-06-16 NOTE — Telephone Encounter (Signed)
-----   Message from Erma Heritage, [place] sent at 06/15/2019  7:23 PM EST ----- Covering for Dr. Domenic Polite - Please let the patient know her Hgb and platelet count are within a normal range. WBC's slightly elevated. Her electrolytes are stable. Kidney function has worsened since 01/2019 as creatinine was previously 1.29 and is now 1.57. Please confirm which diuretic she is taking as her medication list includes Lasix 40mg  daily, Lasix 80mg  daily, Losartan-HCTZ and Torsemide 80mg  daily. She should not be on BOTH Lasix and Torsemide so hopefully the list was just not updated but we are going to need to make adjustments in her regimen once verified what she is taking.

## 2019-06-16 NOTE — Telephone Encounter (Signed)
    Thank you for clarifying this. Would reduce Torsemide to 60mg  daily and repeat BMET in 2-3 weeks.   Signed, Erma Heritage, PA-C 06/16/2019, 9:40 AM Pager: [phone]

## 2019-06-20 ENCOUNTER — Other Ambulatory Visit: Payer: Self-pay

## 2019-06-20 ENCOUNTER — Other Ambulatory Visit: Payer: Self-pay | Admitting: Cardiology

## 2019-06-20 MED ORDER — TORSEMIDE 20 MG PO TABS: 60.0000 mg | ORAL_TABLET | Freq: Every day | ORAL | 3 refills | Status: DC

## 2019-06-20 NOTE — Telephone Encounter (Signed)
Refilled torsemide

## 2019-06-21 ENCOUNTER — Ambulatory Visit (INDEPENDENT_AMBULATORY_CARE_PROVIDER_SITE_OTHER): Payer: Medicare PPO | Admitting: *Deleted

## 2019-06-21 ENCOUNTER — Other Ambulatory Visit: Payer: Self-pay

## 2019-06-21 DIAGNOSIS — I4891 Unspecified atrial fibrillation: Secondary | ICD-10-CM | POA: Diagnosis not present

## 2019-06-21 DIAGNOSIS — I480 Paroxysmal atrial fibrillation: Secondary | ICD-10-CM

## 2019-06-21 DIAGNOSIS — Z5181 Encounter for therapeutic drug level monitoring: Secondary | ICD-10-CM

## 2019-06-21 NOTE — Progress Notes (Signed)
1 month Eliquis follow up:  Pt was started on Eliquis 5mg  twice daily for atrial fibrillation on 05/10/2019.    She denies any adverse effects since starting Eliquis.    Reviewed patients medication list.  Pt is not currently on any combined P-gp and strong CYP3A4 inhibitors/inducers (ketoconazole, traconazole, ritonavir, carbamazepine, phenytoin, rifampin, St. [name]'s wort).  Reviewed labs from 06/15/19.  SCr 1.57, Weight 76.2kg, CrCl 31.51.  Dose was appropriate based on age, weight, and SCr but she now meets 2 out of 3 criteria to decrease dose.  Elevated creatine has been addressed by MD by decreasing diuretic.  She is scheduled for repeat BMP week of 07/03/2019.  If SCr has stablized will leave pt on current dose.  If SCr is still above 1.5 will decrease Eliquis to 2.5mg  twice daily.  Will call patient with instructions after repeat labs.  Hgb and HCT: 13.5/41.2  A full discussion of the nature of anticoagulants has been carried out.  A benefit/risk analysis has been presented to the patient, so that they understand the justification for choosing anticoagulation with Eliquis at this time.  The need for compliance is stressed.  Pt is aware to take the medication twice daily.  Side effects of potential bleeding are discussed, including unusual colored urine or stools, coughing up blood or coffee ground emesis, nose bleeds or serious fall or head trauma.  Discussed signs and symptoms of stroke. The patient should avoid any OTC items containing aspirin or ibuprofen.  Avoid alcohol consumption.   Call if any signs of abnormal bleeding.  Discussed financial obligations and resolved any difficulty in obtaining medication.  Next lab test in 3 months.

## 2019-06-30 ENCOUNTER — Other Ambulatory Visit (HOSPITAL_COMMUNITY)
Admission: RE | Admit: 2019-06-30 | Discharge: 2019-06-30 | Disposition: A | Discharge status: Home / Self Care | Payer: Medicare PPO | Source: Ambulatory Visit | Attending: Student | Admitting: Student

## 2019-06-30 DIAGNOSIS — Z79899 Other long term (current) drug therapy: Secondary | ICD-10-CM | POA: Insufficient documentation

## 2019-06-30 LAB — BASIC METABOLIC PANEL
Anion gap: 9 (ref 5–15)
BUN: 38 mg/dL — ABNORMAL HIGH (ref 8–23)
CO2: 27 mmol/L (ref 22–32)
Calcium: 9.4 mg/dL (ref 8.9–10.3)
Chloride: 106 mmol/L (ref 98–111)
Creatinine, Ser: 1.41 mg/dL — ABNORMAL HIGH (ref 0.44–1.00)
GFR calc Af Amer: 39 mL/min — ABNORMAL LOW (ref >60)
GFR calc non Af Amer: 34 mL/min — ABNORMAL LOW (ref >60)
Glucose, Bld: 123 mg/dL — ABNORMAL HIGH (ref 70–99)
Potassium: 4.2 mmol/L (ref 3.5–5.1)
Sodium: 142 mmol/L (ref 135–145)

## 2019-07-03 ENCOUNTER — Telehealth: Payer: Self-pay | Admitting: Cardiology

## 2019-07-03 NOTE — Telephone Encounter (Signed)
Patient called to let office know she had blood work on Friday

## 2019-07-03 NOTE — Telephone Encounter (Signed)
LM on voice mail for pt with lab results.  Kidney function has improved slightly since decreasing diuretics.  Pt will remain on Eliquis 5mg  twice daily.

## 2019-07-06 ENCOUNTER — Ambulatory Visit: Payer: Medicare Other | Admitting: Family Medicine

## 2019-07-13 ENCOUNTER — Telehealth: Payer: Self-pay | Admitting: *Deleted

## 2019-07-13 DIAGNOSIS — Z5181 Encounter for therapeutic drug level monitoring: Secondary | ICD-10-CM

## 2019-07-13 DIAGNOSIS — I480 Paroxysmal atrial fibrillation: Secondary | ICD-10-CM

## 2019-07-13 NOTE — Telephone Encounter (Signed)
Pt wanted to know when she needs next Eliquis follow up.  Base on recent labs, I will see pt back in 4 months (11/15/19) and she will get BMP and CBC at [facility] 1 week before appt.  Lab orders mailed to patient.  She verbalized understanding.

## 2019-07-13 NOTE — Telephone Encounter (Signed)
Patient called requesting to speak with Edrick Oh, RN. She has a question about her coumdin.

## 2019-07-17 ENCOUNTER — Ambulatory Visit (INDEPENDENT_AMBULATORY_CARE_PROVIDER_SITE_OTHER): Payer: Medicare PPO | Admitting: *Deleted

## 2019-07-17 DIAGNOSIS — I4729 Other ventricular tachycardia: Secondary | ICD-10-CM

## 2019-07-17 DIAGNOSIS — I472 Ventricular tachycardia: Secondary | ICD-10-CM | POA: Diagnosis not present

## 2019-07-17 LAB — CUP PACEART REMOTE DEVICE CHECK
Battery Remaining Longevity: 19 mo
Battery Voltage: 2.91 V
Brady Statistic AP VP Percent: 10.92 %
Brady Statistic AP VS Percent: 0 %
Brady Statistic AS VP Percent: 88.98 %
Brady Statistic AS VS Percent: 0.09 %
Brady Statistic RA Percent Paced: 10.21 %
Brady Statistic RV Percent Paced: 99.85 %
Date Time Interrogation Session: 20210329061704
HighPow Impedance: 57 Ohm
HighPow Impedance: 82 Ohm
Implantable Lead Implant Date: 20030823
Implantable Lead Implant Date: 20030823
Implantable Lead Location: 753859
Implantable Lead Location: 753860
Implantable Lead Model: 157
Implantable Lead Model: 4086
Implantable Lead Serial Number: 108215
Implantable Lead Serial Number: 112190
Implantable Pulse Generator Implant Date: 20150608
Lead Channel Impedance Value: 342 Ohm
Lead Channel Impedance Value: 361 Ohm
Lead Channel Impedance Value: 456 Ohm
Lead Channel Pacing Threshold Amplitude: 0.75 V
Lead Channel Pacing Threshold Amplitude: 1.125 V
Lead Channel Pacing Threshold Pulse Width: 0.4 ms
Lead Channel Pacing Threshold Pulse Width: 0.4 ms
Lead Channel Sensing Intrinsic Amplitude: 10 mV
Lead Channel Sensing Intrinsic Amplitude: 10 mV
Lead Channel Sensing Intrinsic Amplitude: 2.875 mV
Lead Channel Sensing Intrinsic Amplitude: 2.875 mV
Lead Channel Setting Pacing Amplitude: 2 V
Lead Channel Setting Pacing Amplitude: 2.5 V
Lead Channel Setting Pacing Pulse Width: 0.4 ms
Lead Channel Setting Sensing Sensitivity: 0.3 mV

## 2019-07-17 NOTE — Progress Notes (Signed)
ICD Remote  

## 2019-07-24 ENCOUNTER — Other Ambulatory Visit: Payer: Self-pay | Admitting: Cardiology

## 2019-08-07 ENCOUNTER — Other Ambulatory Visit: Payer: Self-pay | Admitting: Internal Medicine

## 2019-09-03 NOTE — Progress Notes (Signed)
Cardiology Office Note  Date: 09/04/2019   ID: [name], [name] [birth year], MRN [id number]  PCP:  Asencion Noble, MD  Cardiologist:  Rozann Lesches, MD Electrophysiologist:  [name], MD   Chief Complaint  Patient presents with  . Cardiac follow-up    History of Present Illness: [name] [name] is an 86 y.o. female last seen in January.  She presents for a follow-up visit.  She states that she has been more short of breath with activity, also feels a cramping sensation in her left abdominal and lower chest area intermittently.  Still no specific sense of palpitations.  She sees Dr. Lovena Le, [employer] ICD in place.  Device check in March is consistent with persistent atrial fibrillation at this point (18% rhythm burden as of December 2020).  Her ECG today however shows dual-chamber pacing suggesting that she converted back to sinus rhythm at some point in the interim.  She has switched from Coumadin to Eliquis.  She is tolerating this so far.  I reviewed the remainder of her cardiac medicines which are outlined below.  I talked with her today about considering follow-up cardiac structural and ischemic testing, she seems to be very troubled by her symptoms and "wants to know what is going on."  Past Medical History:  Diagnosis Date  . Anxiety   . Chronic atrial fibrillation ([place])   . Chronic back pain   . Chronic diastolic heart failure ([place])   . Chronic obstructive pulmonary disease, unspecified ([place])   . Complete heart block ([place])   . COPD (chronic obstructive pulmonary disease) ([place])   . Coronary atherosclerosis of native coronary artery    Multivessel status post CABG, DES PLA March 2006  . DDD (degenerative disc disease), lumbar   . Essential hypertension   . Headache   . ICD (implantable cardioverter-defibrillator) in place   . Mixed hyperlipidemia   . Osteoarthritis   . Ventricular fibrillation ([place]) 2003   a. seen on PPM interrogation a/w syncope    Past Surgical  History:  Procedure Laterality Date  . CARDIAC DEFIBRILLATOR PLACEMENT     MDT dual chamber ICD  . CARDIOVERSION N/A 08/09/2014   Procedure: CARDIOVERSION;  Surgeon: Sanda Klein, MD;  Location: [facility];  Service: Cardiovascular;  Laterality: N/A;  . CORONARY ARTERY BYPASS GRAFT     LIMA to LAD, SVG to diagonal, SVG to ramus and OM  . IMPLANTABLE CARDIOVERTER DEFIBRILLATOR GENERATOR CHANGE N/A 09/25/2013   Procedure: IMPLANTABLE CARDIOVERTER DEFIBRILLATOR GENERATOR CHANGE;  Surgeon: Evans Lance, MD;  Location: [facility] CATH LAB;  Service: Cardiovascular;  Laterality: N/A;  . LEAD REVISION N/A 09/29/2013   Procedure: LEAD REVISION;  Surgeon: Deboraha Sprang, MD;  Location: [facility] C[facility] LAB;  Service: Cardiovascular;  Laterality: N/A;  . TONSILLECTOMY    . YAG LASER APPLICATION Left [id number]   Procedure: YAG LASER APPLICATION;  Surgeon: Elta Guadeloupe T. Gershon Crane, MD;  Location: [facility] ORS;  Service: Ophthalmology;  Laterality: Left;  . YAG LASER APPLICATION Right [id number]   Procedure: YAG LASER APPLICATION;  Surgeon: Elta Guadeloupe T. Gershon Crane, MD;  Location: [facility] ORS;  Service: Ophthalmology;  Laterality: Right;    Current Outpatient Medications  Medication Sig Dispense Refill  . acetaminophen (TYLENOL) 500 MG tablet Take 500 mg by mouth every morning.     [name] albuterol (PROVENTIL HFA;VENTOLIN HFA) 108 (90 BASE) MCG/ACT inhaler Inhale 2 puffs into the lungs every 6 (six) hours as needed for wheezing or shortness of breath. 1 Inhaler 2  . amLODipine (  NORVASC) 5 MG tablet TAKE (1) TABLET BY MOUTH ONCE DAILY. 30 tablet 6  . apixaban (ELIQUIS) 5 MG TABS tablet Take 1 tablet (5 mg total) by mouth 2 (two) times daily. 180 tablet 3  . atorvastatin (LIPITOR) 20 MG tablet TAKE ONE TABLET BY MOUTH ONCE DAILY. 90 tablet 3  . Calcium Carbonate-Vitamin D (CALCIUM 600 + D PO) Take 1 tablet by mouth 2 (two) times daily.    . Cholecalciferol (VITAMIN D) 2000 UNITS CAPS Take 1 capsule by mouth at bedtime.     . dofetilide (TIKOSYN) 125 MCG  capsule TAKE (1) CAPSULE BY MOUTH TWICE DAILY. 99991111 capsule 0  . folic acid (FOLVITE) 1 MG tablet Take 1 mg by mouth 2 (two) times daily.     [name] losartan (COZAAR) 100 MG tablet TAKE ONE TABLET BY MOUTH DAILY. 90 tablet 0  . metoprolol succinate (TOPROL-XL) 100 MG 24 hr tablet TAKE ONE TABLET BY MOUTH EVERY MORNING. TAKE WITH OR IMMEDIATELY FOLLOWING A MEAL. 90 tablet 0  . Polyethyl Glycol-Propyl Glycol (SYSTANE OP) Place 1 drop into both eyes daily as needed (Dry eyes).    . potassium chloride SA (K-DUR,KLOR-CON) 20 MEQ tablet Take 20 mEq by mouth 3 (three) times daily.    [name] torsemide (DEMADEX) 20 MG tablet Take 3 tablets (60 mg total) by mouth daily. 270 tablet 3   No current facility-administered medications for this visit.   Allergies:  Codeine and Keflex [cephalexin]   ROS:   No dizziness or syncope.  Physical Exam: VS:  BP 130/68   Pulse 66   Ht 5' (1.524 m)   Wt 165 lb (74.8 kg)   SpO2 96%   BMI 32.22 kg/m , BMI Body mass index is 32.22 kg/m.  Wt Readings from Last 3 Encounters:  09/04/19 165 lb (74.8 kg)  05/03/19 168 lb (76.2 kg)  02/06/19 167 lb (75.8 kg)    General: Elderly woman, appears comfortable at rest. HEENT: Conjunctiva and lids normal, wearing a mask. Neck: Supple, no elevated JVP or carotid bruits, no thyromegaly. Lungs: Clear to auscultation, nonlabored breathing at rest. Cardiac: Regular rate and rhythm, no S3, 2/6 systolic murmur. Abdomen: Soft, bowel sounds present. Extremities: Mild ankle edema, distal pulses 2+.  ECG:  An ECG dated 06/22/2018 was personally reviewed today and demonstrated:  Ventricular pacing with underlying atrial fibrillation.  Recent Labwork: 06/15/2019: Hemoglobin 13.5; Platelets 214 06/30/2019: BUN 38; Creatinine, Ser 1.41; Potassium 4.2; Sodium 142     Component Value Date/Time   CHOL 111 01/24/2013 0946   TRIG 119 01/24/2013 0946   HDL 31 (L) 01/24/2013 0946   CHOLHDL 3.6 01/24/2013 0946   VLDL 24 01/24/2013 0946   LDLCALC  56 01/24/2013 0946    Other Studies Reviewed Today:  Echocardiogram 09/13/2013: Study Conclusions  - Left ventricle: The cavity size was normal. Wall thickness was increased in a pattern of mild LVH. Systolic function was normal. The estimated ejection fraction was in the range of 60% to 65%. Wall motion was normal; there were no regional wall motion abnormalities. Features are consistent with a pseudonormal left ventricular filling pattern, with concomitant abnormal relaxation and increased filling pressure (grade 2 diastolic dysfunction). Doppler parameters are consistent with elevated ventricular end-diastolic filling pressure. - Aortic valve: Mildly calcified annulus. Trileaflet; mildly calcified leaflets. There was very mild stenosis. There was no significant regurgitation. Mean gradient (S): 9 mm Hg. Valve area (VTI): 1.52 cm^2. Valve area (Vmax): 1.71 cm^2. - Mitral valve: Calcified annulus. Mildly thickened leaflets .  There was mild regurgitation. - Left atrium: The atrium was mildly to moderately dilated. - Right ventricle: The cavity size was mildly dilated. Pacer wire or catheter noted in right ventricle. - Right atrium: Central venous pressure (est): 3 mm Hg. - Atrial septum: No defect or patent foramen ovale was identified. - Tricuspid valve: There was mild regurgitation. - Pulmonary arteries: PA peak pressure: 42 mm Hg (S). - Pericardium, extracardiac: There was no pericardial effusion.  Impressions:  - Mild LVH with LVEF [id number], grade 2 diastolic dysfunction with increased filling pressures. MIld to moderate left atrial enlargement. Mild mitral regurgitation. Very mildly stenotic aortic valve. Device wire noted in right heart. Mild tricuspid regurgitation with PASP 42 mmHg.  Lexiscan Cardiolite 06/15/2014: FINDINGS: Pharmacological stress  Baseline EKG shows ventricular pacing. After injection heart rate increased from 61  beats per min up to 75 beats per min, and blood pressure decreased from 139/76 and 113/66. The test was stopped after injection was complete, the patient did not experience any chest pain. Post-injection EKG showed no significant arrhythmias, findings not interpretable for ischemia because she was ventricular paced throughout the study.  Perfusion: No decreased activity in the left ventricle on stress imaging to suggest reversible ischemia or infarction.  Wall Motion: Normal left ventricular wall motion. No left ventricular dilation.  Left Ventricular Ejection Fraction: 77 %  End diastolic volume 55 ml  End systolic volume 12 ml  IMPRESSION: 1. No reversible ischemia or infarction.  2. Normal left ventricular wall motion.  3. Left ventricular ejection fraction 77%  4. Low-risk stress test findings*.  Assessment and Plan:  1.  Rest of dyspnea on exertion with intermittent left upper abdominal to chest discomfort.  No specific sense of palpitations.  Likely a multifactorial process, actually I had expected her to be in atrial fibrillation today, but ECG shows dual-chamber pacing suggesting that she converted at some point since March.  We will obtain an echocardiogram to follow-up on cardiac structural assessment and a Lexiscan Myoview to evaluate ischemic burden.  Likelihood of graft disease is high at this point.  Office follow-up arranged to discuss the results.  2.  Paroxysmal to persistent atrial fibrillation.  She remains on Tikosyn, Toprol-XL, and is now on Eliquis which she is tolerating.  3.  CAD status post CABG as well as DES to the PLA.  Follow-up ischemic evaluation is being undertaken as discussed above.  Medication Adjustments/Labs and Tests Ordered: Current medicines are reviewed at length with the patient today.  Concerns regarding medicines are outlined above.   Tests Ordered: Orders Placed This Encounter  Procedures  . NM Myocar Multi W/Spect W/Wall  Motion / EF  . EKG 12-Lead  . ECHOCARDIOGRAM COMPLETE    Medication Changes: No orders of the defined types were placed in this encounter.   Disposition:  Follow up in the next few weeks to discuss test results.  Signed, Satira Sark, MD, [facility] 09/04/2019 4:45 PM    [facility] at [facility] [address] [place], [place] [address] Phone: [phone]; Fax: [phone]

## 2019-09-04 ENCOUNTER — Encounter: Payer: Self-pay | Admitting: Cardiology

## 2019-09-04 ENCOUNTER — Ambulatory Visit: Payer: Medicare PPO | Admitting: Cardiology

## 2019-09-04 ENCOUNTER — Other Ambulatory Visit: Payer: Self-pay

## 2019-09-04 VITALS — BP 130/68 | HR 66 | Ht 60.0 in | Wt 165.0 lb

## 2019-09-04 DIAGNOSIS — I4819 Other persistent atrial fibrillation: Secondary | ICD-10-CM | POA: Diagnosis not present

## 2019-09-04 DIAGNOSIS — I25119 Atherosclerotic heart disease of native coronary artery with unspecified angina pectoris: Secondary | ICD-10-CM

## 2019-09-04 DIAGNOSIS — R0602 Shortness of breath: Secondary | ICD-10-CM | POA: Diagnosis not present

## 2019-09-04 DIAGNOSIS — Z95810 Presence of automatic (implantable) cardiac defibrillator: Secondary | ICD-10-CM

## 2019-09-04 NOTE — Patient Instructions (Signed)
Medication Instructions:  Your physician recommends that you continue on your current medications as directed. Please refer to the Current Medication list given to you today.  *If you need a refill on your cardiac medications before your next appointment, please call your pharmacy*   Lab Work: None today If you have labs (blood work) drawn today and your tests are completely normal, you will receive your results only by: [name] MyChart Message (if you have MyChart) OR . A paper copy in the mail If you have any lab test that is abnormal or we need to change your treatment, we will call you to review the results.   Testing/Procedures: Your physician has requested that you have a lexiscan myoview. For further information please visit [email or web address]. Please follow instruction sheet, as given.  Your physician has requested that you have an echocardiogram. Echocardiography is a painless test that uses sound waves to create images of your heart. It provides your doctor with information about the size and shape of your heart and how well your heart's chambers and valves are working. This procedure takes approximately one hour. There are no restrictions for this procedure.     Follow-Up: At [facility], you and your health needs are our priority.  As part of our continuing mission to provide you with exceptional heart care, we have created designated Provider Care Teams.  These Care Teams include your primary Cardiologist (physician) and Advanced Practice Providers (APPs -  Physician Assistants and Nurse Practitioners) who all work together to provide you with the care you need, when you need it.  We recommend signing up for the patient portal called "MyChart".  Sign up information is provided on this After Visit Summary.  MyChart is used to connect with patients for Virtual Visits (Telemedicine).  Patients are able to view lab/test results, encounter notes, upcoming appointments, etc.  Non-urgent  messages can be sent to your provider as well.   To learn more about what you can do with MyChart, go to [email or web address].    Your next appointment:   3 week(s)  The format for your next appointment:   In Person  Provider:   Rozann Lesches, MD   Other Instructions None      Thank you for choosing [facility] !

## 2019-09-19 ENCOUNTER — Encounter (HOSPITAL_BASED_OUTPATIENT_CLINIC_OR_DEPARTMENT_OTHER)
Admission: RE | Admit: 2019-09-19 | Discharge: 2019-09-19 | Disposition: A | Discharge status: Home / Self Care | Payer: Medicare PPO | Source: Ambulatory Visit | Attending: Cardiology | Admitting: Cardiology

## 2019-09-19 ENCOUNTER — Other Ambulatory Visit: Payer: Self-pay

## 2019-09-19 ENCOUNTER — Encounter (HOSPITAL_COMMUNITY)
Admission: RE | Admit: 2019-09-19 | Discharge: 2019-09-19 | Disposition: A | Discharge status: Home / Self Care | Payer: Medicare PPO | Source: Ambulatory Visit | Attending: Cardiology | Admitting: Cardiology

## 2019-09-19 DIAGNOSIS — R0602 Shortness of breath: Secondary | ICD-10-CM | POA: Insufficient documentation

## 2019-09-19 LAB — NM MYOCAR MULTI W/SPECT W/WALL MOTION / EF
LV dias vol: 75 mL (ref 46–106)
LV sys vol: 28 mL
Peak HR: 73 {beats}/min
RATE: 0.34
Rest HR: 65 {beats}/min
SDS: 4
SRS: 0
SSS: 4
TID: 1.29

## 2019-09-19 MED ORDER — SODIUM CHLORIDE FLUSH 0.9 % IV SOLN
INTRAVENOUS | Status: AC
  Administered 2019-09-19: 10 mL via INTRAVENOUS
  Filled 2019-09-19: qty 10

## 2019-09-19 MED ORDER — TECHNETIUM TC 99M TETROFOSMIN IV KIT
10.0000 | PACK | Freq: Once | INTRAVENOUS | Status: AC | PRN
  Administered 2019-09-19: 10.4 via INTRAVENOUS

## 2019-09-19 MED ORDER — REGADENOSON 0.4 MG/5ML IV SOLN
INTRAVENOUS | Status: AC
  Administered 2019-09-19: 0.4 mg via INTRAVENOUS
  Filled 2019-09-19: qty 5

## 2019-09-19 MED ORDER — TECHNETIUM TC 99M TETROFOSMIN IV KIT
30.0000 | PACK | Freq: Once | INTRAVENOUS | Status: AC | PRN
  Administered 2019-09-19: 33 via INTRAVENOUS

## 2019-09-20 ENCOUNTER — Ambulatory Visit (HOSPITAL_COMMUNITY)
Admission: RE | Admit: 2019-09-20 | Discharge: 2019-09-20 | Disposition: A | Discharge status: Home / Self Care | Payer: Medicare PPO | Source: Ambulatory Visit | Attending: Cardiology | Admitting: Cardiology

## 2019-09-20 DIAGNOSIS — R0602 Shortness of breath: Secondary | ICD-10-CM | POA: Diagnosis present

## 2019-09-20 NOTE — Progress Notes (Signed)
*  PRELIMINARY RESULTS* Echocardiogram 2D Echocardiogram has been performed.  Leavy Cella 09/20/2019, 3:55 PM

## 2019-09-29 ENCOUNTER — Ambulatory Visit: Payer: Medicare PPO | Admitting: Cardiology

## 2019-09-29 NOTE — Progress Notes (Deleted)
Cardiology Office Note  Date: 09/29/2019   ID: [name] [name] [birth year], MRN [id number]  PCP:  Asencion Noble, MD  Cardiologist:  Rozann Lesches, MD Electrophysiologist:  [name], MD   No chief complaint on file.   History of Present Illness: [name] [name] is an 86 y.o. female seen recently in May.  Follow-up cardiac testing done recently is outlined below.  Lexiscan Myoview was low risk.  LVEF was 60 to 65% by echocardiogram with moderate diastolic dysfunction, severe biatrial enlargement, mild mitral regurgitation, mild aortic stenosis, and moderate pulmonary hypertension with PASP 42 mmHg.  She sees Dr. Lovena Le, Medtronic ICD in place.  She was in an atrial paced rhythm by ECG at last visit however had been in persistent atrial fibrillation prior to that.   Past Medical History:  Diagnosis Date   Anxiety    Chronic atrial fibrillation (HCC)    Chronic back pain    Chronic diastolic heart failure (HCC)    Chronic obstructive pulmonary disease, unspecified (HCC)    Complete heart block (HCC)    COPD (chronic obstructive pulmonary disease) (HCC)    Coronary atherosclerosis of native coronary artery    Multivessel status post CABG, DES PLA March 2006   DDD (degenerative disc disease), lumbar    Essential hypertension    Headache    ICD (implantable cardioverter-defibrillator) in place    Mixed hyperlipidemia    Osteoarthritis    Ventricular fibrillation ([place]) 2003   a. seen on PPM interrogation a/w syncope    Past Surgical History:  Procedure Laterality Date   CARDIAC DEFIBRILLATOR PLACEMENT     MDT dual chamber ICD   CARDIOVERSION N/A 08/09/2014   Procedure: CARDIOVERSION;  Surgeon: Sanda Klein, MD;  [facility];  Service: Cardiovascular;  Laterality: N/A;   CORONARY ARTERY BYPASS GRAFT     LIMA to LAD, SVG to diagonal, SVG to ramus and OM   IMPLANTABLE CARDIOVERTER DEFIBRILLATOR GENERATOR CHANGE N/A 09/25/2013   Procedure:  IMPLANTABLE CARDIOVERTER DEFIBRILLATOR GENERATOR CHANGE;  Surgeon: Evans Lance, MD;  Location: [facility] CATH LAB;  Service: Cardiovascular;  Laterality: N/A;   LEAD REVISION N/A 09/29/2013   Procedure: LEAD REVISION;  Surgeon: Deboraha Sprang, MD;  Location: [facility] C[facility] LAB;  Service: Cardiovascular;  Laterality: N/A;   TONSILLECTOMY     YAG LASER APPLICATION Left 08/26/5275   Procedure: YAG LASER APPLICATION;  Surgeon: Elta Guadeloupe T. Gershon Crane, MD;  Location: [facility];  Service: Ophthalmology;  Laterality: Left;   YAG LASER APPLICATION Right 12/12/2351   Procedure: YAG LASER APPLICATION;  Surgeon: Elta Guadeloupe T. Gershon Crane, MD;  Location: [facility];  Service: Ophthalmology;  Laterality: Right;    Current Outpatient Medications  Medication Sig Dispense Refill   acetaminophen (TYLENOL) 500 MG tablet Take 500 mg by mouth every morning.      albuterol (PROVENTIL HFA;VENTOLIN HFA) 108 (90 BASE) MCG/ACT inhaler Inhale 2 puffs into the lungs every 6 (six) hours as needed for wheezing or shortness of breath. 1 Inhaler 2   amLODipine (NORVASC) 5 MG tablet TAKE (1) TABLET BY MOUTH ONCE DAILY. 30 tablet 6   apixaban (ELIQUIS) 5 MG TABS tablet Take 1 tablet (5 mg total) by mouth 2 (two) times daily. 180 tablet 3   atorvastatin (LIPITOR) 20 MG tablet TAKE ONE TABLET BY MOUTH ONCE DAILY. 90 tablet 3   Calcium Carbonate-Vitamin D (CALCIUM 600 + D PO) Take 1 tablet by mouth 2 (two) times daily.     Cholecalciferol (VITAMIN D) 2000  UNITS CAPS Take 1 capsule by mouth at bedtime.      dofetilide (TIKOSYN) 125 MCG capsule TAKE (1) CAPSULE BY MOUTH TWICE DAILY. 539 capsule 0   folic acid (FOLVITE) 1 MG tablet Take 1 mg by mouth 2 (two) times daily.      losartan (COZAAR) 100 MG tablet TAKE ONE TABLET BY MOUTH DAILY. 90 tablet 0   metoprolol succinate (TOPROL-XL) 100 MG 24 hr tablet TAKE ONE TABLET BY MOUTH EVERY MORNING. TAKE WITH OR IMMEDIATELY FOLLOWING A MEAL. 90 tablet 0   Polyethyl Glycol-Propyl Glycol (SYSTANE OP) Place 1 drop  into both eyes daily as needed (Dry eyes).     potassium chloride SA (K-DUR,KLOR-CON) 20 MEQ tablet Take 20 mEq by mouth 3 (three) times daily.     torsemide (DEMADEX) 20 MG tablet Take 3 tablets (60 mg total) by mouth daily. 270 tablet 3   No current facility-administered medications for this visit.   Allergies:  Codeine and Keflex [cephalexin]   Social History: The patient  reports that she has never smoked. She has never used smokeless tobacco. She reports that she does not drink alcohol and does not use drugs.   Family History: The patient's family history includes Heart attack in her brother and father.   ROS:  Please see the history of present illness. Otherwise, complete review of systems is positive for {NONE DEFAULTED:18576::"none"}.  All other systems are reviewed and negative.   Physical Exam: VS:  There were no vitals taken for this visit., BMI There is no height or weight on file to calculate BMI.  Wt Readings from Last 3 Encounters:  09/04/19 165 lb (74.8 kg)  05/03/19 168 lb (76.2 kg)  02/06/19 167 lb (75.8 kg)    General: Patient appears comfortable at rest. HEENT: Conjunctiva and lids normal, oropharynx clear with moist mucosa. Neck: Supple, no elevated JVP or carotid bruits, no thyromegaly. Lungs: Clear to auscultation, nonlabored breathing at rest. Cardiac: Regular rate and rhythm, no S3 or significant systolic murmur, no pericardial rub. Abdomen: Soft, nontender, no hepatomegaly, bowel sounds present, no guarding or rebound. Extremities: No pitting edema, distal pulses 2+. Skin: Warm and dry. Musculoskeletal: No kyphosis. Neuropsychiatric: Alert and oriented x3, affect grossly appropriate.  ECG:  An ECG dated 09/04/2019 was personally reviewed today and demonstrated:  Dual-chamber paced rhythm.  Recent Labwork: 06/15/2019: Hemoglobin 13.5; Platelets 214 06/30/2019: BUN 38; Creatinine, Ser 1.41; Potassium 4.2; Sodium 142   Other Studies Reviewed  Today:  Echocardiogram 09/20/2019: 1. Left ventricular ejection fraction, by estimation, is 60 to 65%. The  left ventricle has normal function. The left ventricle has no regional  wall motion abnormalities. Left ventricular diastolic parameters are  consistent with Grade II diastolic  dysfunction (pseudonormalization). Elevated left atrial pressure.  2. Right ventricular systolic function is normal. The right ventricular  size is normal. There is moderately elevated pulmonary artery systolic  pressure.  3. Left atrial size was severely dilated.  4. Right atrial size was severely dilated.  5. The mitral valve is normal in structure. Mild mitral valve  regurgitation. No evidence of mitral stenosis.  6. The aortic valve was not well visualized. Aortic valve regurgitation  is not visualized. Mild aortic valve stenosis. Aortic valve mean gradient  measures 10.7 mmHg. Aortic valve peak gradient measures 18.7 mmHg. Aortic  valve area, by VTI measures 1.50  cm.  7. PASP is 42 mmHg, mild to moderate pulmonary HTN.  8. The inferior vena cava is normal in size with greater  than 50%  respiratory variability, suggesting right atrial pressure of 3 mmHg.  [place] 09/19/2019:  The study is normal. There are no perfusion defects  This is a low risk study.  The left ventricular ejection fraction is normal (55-65%).  AV paced throughout the study  Assessment and Plan:    Medication Adjustments/Labs and Tests Ordered: Current medicines are reviewed at length with the patient today.  Concerns regarding medicines are outlined above.   Tests Ordered: No orders of the defined types were placed in this encounter.   Medication Changes: No orders of the defined types were placed in this encounter.   Disposition:  Follow up {follow up:15908}  Signed, Satira Sark, MD, [facility] 09/29/2019 11:51 AM    [place] at [place], [place],   [address] Phone: [phone]; Fax: [phone]

## 2019-10-03 ENCOUNTER — Telehealth: Payer: Self-pay | Admitting: Licensed Clinical Social Worker

## 2019-10-03 NOTE — Telephone Encounter (Signed)
CSW contacted patient to invite to the outdoor cooking series featuring [employer] at [facility]. Patient states interest in attending and will call if unable to attend. Raquel Sarna, [place], [place]

## 2019-10-10 ENCOUNTER — Other Ambulatory Visit: Payer: Self-pay | Admitting: Cardiology

## 2019-10-16 ENCOUNTER — Ambulatory Visit (INDEPENDENT_AMBULATORY_CARE_PROVIDER_SITE_OTHER): Payer: Medicare PPO | Admitting: *Deleted

## 2019-10-16 DIAGNOSIS — I5032 Chronic diastolic (congestive) heart failure: Secondary | ICD-10-CM

## 2019-10-16 DIAGNOSIS — I4729 Other ventricular tachycardia: Secondary | ICD-10-CM

## 2019-10-17 LAB — CUP PACEART REMOTE DEVICE CHECK
Battery Remaining Longevity: 19 mo
Battery Voltage: 2.9 V
Brady Statistic AP VP Percent: 62.97 %
Brady Statistic AP VS Percent: 0.01 %
Brady Statistic AS VP Percent: 36.98 %
Brady Statistic AS VS Percent: 0.04 %
Brady Statistic RA Percent Paced: 61.02 %
Brady Statistic RV Percent Paced: 99.88 %
Date Time Interrogation Session: 20210628031806
HighPow Impedance: 62 Ohm
HighPow Impedance: 78 Ohm
Implantable Lead Implant Date: 20030823
Implantable Lead Implant Date: 20030823
Implantable Lead Location: 753859
Implantable Lead Location: 753860
Implantable Lead Model: 157
Implantable Lead Model: 4086
Implantable Lead Serial Number: 108215
Implantable Lead Serial Number: 112190
Implantable Pulse Generator Implant Date: 20150608
Lead Channel Impedance Value: 342 Ohm
Lead Channel Impedance Value: 342 Ohm
Lead Channel Impedance Value: 418 Ohm
Lead Channel Pacing Threshold Amplitude: 0.75 V
Lead Channel Pacing Threshold Amplitude: 1.125 V
Lead Channel Pacing Threshold Pulse Width: 0.4 ms
Lead Channel Pacing Threshold Pulse Width: 0.4 ms
Lead Channel Sensing Intrinsic Amplitude: 10 mV
Lead Channel Sensing Intrinsic Amplitude: 10 mV
Lead Channel Sensing Intrinsic Amplitude: 2.25 mV
Lead Channel Sensing Intrinsic Amplitude: 2.25 mV
Lead Channel Setting Pacing Amplitude: 2 V
Lead Channel Setting Pacing Amplitude: 2.5 V
Lead Channel Setting Pacing Pulse Width: 0.4 ms
Lead Channel Setting Sensing Sensitivity: 0.3 mV

## 2019-10-18 NOTE — Progress Notes (Signed)
Remote ICD transmission.   

## 2019-10-19 ENCOUNTER — Encounter: Payer: Self-pay | Admitting: Cardiology

## 2019-10-19 ENCOUNTER — Ambulatory Visit: Payer: Medicare PPO | Admitting: Cardiology

## 2019-10-19 ENCOUNTER — Ambulatory Visit: Payer: Medicare PPO | Admitting: Student

## 2019-10-19 ENCOUNTER — Other Ambulatory Visit: Payer: Self-pay

## 2019-10-19 VITALS — BP 150/74 | HR 83 | Ht 62.0 in | Wt 163.0 lb

## 2019-10-19 DIAGNOSIS — I4819 Other persistent atrial fibrillation: Secondary | ICD-10-CM | POA: Diagnosis not present

## 2019-10-19 DIAGNOSIS — I25119 Atherosclerotic heart disease of native coronary artery with unspecified angina pectoris: Secondary | ICD-10-CM | POA: Diagnosis not present

## 2019-10-19 DIAGNOSIS — Z95810 Presence of automatic (implantable) cardiac defibrillator: Secondary | ICD-10-CM

## 2019-10-19 NOTE — Progress Notes (Signed)
Cardiology Office Note  Date: 10/19/2019   ID: [name], [name] 03-09-1932, MRN [id number]  PCP:  Asencion Noble, MD  Cardiologist:  Rozann Lesches, MD Electrophysiologist:  [name], MD   Chief Complaint  Patient presents with   Cardiac follow-up    History of Present Illness: [name] [name] is an 86 y.o. female last seen in May.  She presents for a routine visit.  No major change in symptoms, still short of breath as before but no orthopnea or PND, remains functional with ADLs, admits that she has not been as active over the last year.  She does not describe any angina at this time or sense of palpitations.  Follow-up echocardiogram and Myoview studies from June are outlined below.  I went over the results with her today.  She did not have any large ischemic territories and LVEF remains normal.  She does have some diastolic dysfunction, and the degree of aortic stenosis should not be causing any symptoms at this point.  We have decided to continue with medical therapy and observation.  She follows with Dr. Lovena Le, Medtronic ICD in place.  Device interrogation in June indicated 31% AF burden, longest episode lasted for 3 hours.  I reviewed her medications which are outlined below.  Past Medical History:  Diagnosis Date   Anxiety    Chronic atrial fibrillation (HCC)    Chronic back pain    Chronic diastolic heart failure (HCC)    Chronic obstructive pulmonary disease, unspecified (HCC)    Complete heart block (HCC)    COPD (chronic obstructive pulmonary disease) (HCC)    Coronary atherosclerosis of native coronary artery    Multivessel status post CABG, DES PLA March 2006   DDD (degenerative disc disease), lumbar    Essential hypertension    Headache    ICD (implantable cardioverter-defibrillator) in place    Mixed hyperlipidemia    Osteoarthritis    Ventricular fibrillation ([place]) 2003   a. seen on PPM interrogation a/w syncope    Past Surgical History:   Procedure Laterality Date   CARDIAC DEFIBRILLATOR PLACEMENT     MDT dual chamber ICD   CARDIOVERSION N/A 08/09/2014   Procedure: CARDIOVERSION;  Surgeon: Sanda Klein, MD;  [facility]: [facility];  Service: Cardiovascular;  Laterality: N/A;   CORONARY ARTERY BYPASS GRAFT     LIMA to LAD, SVG to diagonal, SVG to ramus and OM   IMPLANTABLE CARDIOVERTER DEFIBRILLATOR GENERATOR CHANGE N/A 09/25/2013   Procedure: IMPLANTABLE CARDIOVERTER DEFIBRILLATOR GENERATOR CHANGE;  Surgeon: Evans Lance, MD;  Location: [facility] CATH LAB;  Service: Cardiovascular;  Laterality: N/A;   LEAD REVISION N/A 09/29/2013   Procedure: LEAD REVISION;  Surgeon: Deboraha Sprang, MD;  Location: [facility] CATH LAB;  Service: Cardiovascular;  Laterality: N/A;   TONSILLECTOMY     YAG LASER APPLICATION Left 06/23/1441   Procedure: YAG LASER APPLICATION;  Surgeon: Elta Guadeloupe T. Gershon Crane, MD;  Location: [facility];  Service: Ophthalmology;  Laterality: Left;   YAG LASER APPLICATION Right 1/54/0086   Procedure: YAG LASER APPLICATION;  Surgeon: Elta Guadeloupe T. Gershon Crane, MD;  Location: [facility];  Service: Ophthalmology;  Laterality: Right;    Current Outpatient Medications  Medication Sig Dispense Refill   acetaminophen (TYLENOL) 500 MG tablet Take 500 mg by mouth every morning.      albuterol (PROVENTIL HFA;VENTOLIN HFA) 108 (90 BASE) MCG/ACT inhaler Inhale 2 puffs into the lungs every 6 (six) hours as needed for wheezing or shortness of breath. 1 Inhaler 2  amLODipine (NORVASC) 5 MG tablet TAKE (1) TABLET BY MOUTH ONCE DAILY. 30 tablet 6   apixaban (ELIQUIS) 5 MG TABS tablet Take 1 tablet (5 mg total) by mouth 2 (two) times daily. 180 tablet 3   atorvastatin (LIPITOR) 20 MG tablet TAKE ONE TABLET BY MOUTH ONCE DAILY. 90 tablet 3   Calcium Carbonate-Vitamin D (CALCIUM 600 + D PO) Take 1 tablet by mouth 2 (two) times daily.     Cholecalciferol (VITAMIN D) 2000 UNITS CAPS Take 1 capsule by mouth at bedtime.      dofetilide (TIKOSYN) 125 MCG capsule  TAKE (1) CAPSULE BY MOUTH TWICE DAILY. 161 capsule 0   folic acid (FOLVITE) 1 MG tablet Take 1 mg by mouth 2 (two) times daily.      losartan (COZAAR) 100 MG tablet TAKE ONE TABLET BY MOUTH DAILY. 90 tablet 3   metoprolol succinate (TOPROL-XL) 100 MG 24 hr tablet TAKE ONE TABLET BY MOUTH EVERY MORNING. TAKE WITH OR IMMEDIATELY FOLLOWING A MEAL. 90 tablet 3   Polyethyl Glycol-Propyl Glycol (SYSTANE OP) Place 1 drop into both eyes daily as needed (Dry eyes).     potassium chloride SA (K-DUR,KLOR-CON) 20 MEQ tablet Take 20 mEq by mouth 3 (three) times daily.     torsemide (DEMADEX) 20 MG tablet Take 3 tablets (60 mg total) by mouth daily. 270 tablet 3   No current facility-administered medications for this visit.   Allergies:  Codeine and Keflex [cephalexin]   ROS:   Hearing loss.  Physical Exam: VS:  BP (!) 150/74    Pulse 83    Ht 5\' 2"  (1.575 m)    Wt 163 lb (73.9 kg)    SpO2 98%    BMI 29.81 kg/m , BMI Body mass index is 29.81 kg/m.  Wt Readings from Last 3 Encounters:  10/19/19 163 lb (73.9 kg)  09/04/19 165 lb (74.8 kg)  05/03/19 168 lb (76.2 kg)    General: Elderly woman, appears comfortable at rest. HEENT: Conjunctiva and lids normal, wearing a mask. Neck: Supple, no elevated JVP or carotid bruits, no thyromegaly. Lungs: Clear to auscultation, nonlabored breathing at rest. Cardiac: Regular rate and rhythm, no S3, 2/6 systolic murmur, no pericardial rub. Extremities: Mild ankle edema, distal pulses 2+.  ECG:  An ECG dated 09/04/2019 was personally reviewed today and demonstrated:  Dual-chamber pacing.  Recent Labwork: 06/15/2019: Hemoglobin 13.5; Platelets 214 06/30/2019: BUN 38; Creatinine, Ser 1.41; Potassium 4.2; Sodium 142     Component Value Date/Time   CHOL 111 01/24/2013 0946   TRIG 119 01/24/2013 0946   HDL 31 (L) 01/24/2013 0946   CHOLHDL 3.6 01/24/2013 0946   VLDL 24 01/24/2013 0946   LDLCALC 56 01/24/2013 0946    Other Studies Reviewed  Today:  Lexiscan Myoview 09/19/2019:  The study is normal. There are no perfusion defects  This is a low risk study.  The left ventricular ejection fraction is normal (55-65%).  AV paced throughout the study  Echocardiogram 09/20/2019: 1. Left ventricular ejection fraction, by estimation, is 60 to 65%. The  left ventricle has normal function. The left ventricle has no regional  wall motion abnormalities. Left ventricular diastolic parameters are  consistent with Grade II diastolic  dysfunction (pseudonormalization). Elevated left atrial pressure.  2. Right ventricular systolic function is normal. The right ventricular  size is normal. There is moderately elevated pulmonary artery systolic  pressure.  3. Left atrial size was severely dilated.  4. Right atrial size was severely dilated.  5.  The mitral valve is normal in structure. Mild mitral valve  regurgitation. No evidence of mitral stenosis.  6. The aortic valve was not well visualized. Aortic valve regurgitation  is not visualized. Mild aortic valve stenosis. Aortic valve mean gradient  measures 10.7 mmHg. Aortic valve peak gradient measures 18.7 mmHg. Aortic  valve area, by VTI measures 1.50  cm.  7. PASP is 42 mmHg, mild to moderate pulmonary HTN.  8. The inferior vena cava is normal in size with greater than 50%  respiratory variability, suggesting right atrial pressure of 3 mmHg.   Assessment and Plan:  1.  Dyspnea on exertion, no recurring chest discomfort at this time or sense of palpitations.  Recent work-up including echocardiogram and Myoview outlined above, our plan at this time is to continue medical therapy in the absence of any substantial perfusion defects to indicate ischemia and normal LVEF.  Continue Norvasc, Cozaar, Toprol-XL, and Lipitor.  2.  Persistent atrial fibrillation.  She continues to have breakthrough episodes although is on Tikosyn, Toprol-XL, and Eliquis.  Most recent device interrogation  indicated 31% AF burden.  This may well be contributing to problem #1.  3.  CAD status post CABG as well as DES to the PLA.  No large ischemic territories by recent Myoview.  4.  Medtronic ICD in place, continue to follow with Dr. Lovena Le.  No device shocks.  Medication Adjustments/Labs and Tests Ordered: Current medicines are reviewed at length with the patient today.  Concerns regarding medicines are outlined above.   Tests Ordered: No orders of the defined types were placed in this encounter.   Medication Changes: No orders of the defined types were placed in this encounter.   Disposition:  Follow up 3 months in the [place] office.  Signed, Satira Sark, MD, [facility] 10/19/2019 1:26 PM    [place] at [place], [place], [place] [address] Phone: [phone]; Fax: [phone]

## 2019-10-19 NOTE — Patient Instructions (Addendum)
Medication Instructions:   Your physician recommends that you continue on your current medications as directed. Please refer to the Current Medication list given to you today.  Labwork:  NONE  Testing/Procedures:  NONE  Follow-Up:  Your physician recommends that you schedule a follow-up appointment in: 3 months at the [place] office with Dr. Domenic Polite.  Any Other Special Instructions Will Be Listed Below (If Applicable).  If you need a refill on your cardiac medications before your next appointment, please call your pharmacy.

## 2019-10-27 ENCOUNTER — Other Ambulatory Visit (HOSPITAL_COMMUNITY): Payer: Self-pay | Admitting: Internal Medicine

## 2019-10-27 DIAGNOSIS — Z1231 Encounter for screening mammogram for malignant neoplasm of breast: Secondary | ICD-10-CM

## 2019-11-01 ENCOUNTER — Other Ambulatory Visit: Payer: Self-pay

## 2019-11-01 ENCOUNTER — Ambulatory Visit (HOSPITAL_COMMUNITY)
Admission: RE | Admit: 2019-11-01 | Discharge: 2019-11-01 | Disposition: A | Discharge status: Home / Self Care | Payer: Medicare PPO | Source: Ambulatory Visit | Attending: Internal Medicine | Admitting: Internal Medicine

## 2019-11-01 DIAGNOSIS — Z1231 Encounter for screening mammogram for malignant neoplasm of breast: Secondary | ICD-10-CM | POA: Insufficient documentation

## 2019-11-03 ENCOUNTER — Telehealth: Payer: Self-pay | Admitting: *Deleted

## 2019-11-03 NOTE — Telephone Encounter (Signed)
Patient informed. Copy sent to PCP °

## 2019-11-03 NOTE — Telephone Encounter (Signed)
-----   Message from Erma Heritage, [place] sent at 11/03/2019  4:31 PM EDT ----- Patient of Dr. Domenic Polite - For unclear reasons, this patient's mammogram that was ordered by Dr. Willey Blade resulted to Dr. Domenic Polite.  Please forward a copy of results to Dr. Willey Blade and let the patient know her mammogram showed no evidence of malignancy.

## 2019-11-08 ENCOUNTER — Other Ambulatory Visit (HOSPITAL_COMMUNITY)
Admission: RE | Admit: 2019-11-08 | Discharge: 2019-11-08 | Disposition: A | Discharge status: Home / Self Care | Payer: Medicare PPO | Source: Ambulatory Visit | Attending: Cardiology | Admitting: Cardiology

## 2019-11-08 ENCOUNTER — Telehealth: Payer: Self-pay | Admitting: *Deleted

## 2019-11-08 DIAGNOSIS — Z5181 Encounter for therapeutic drug level monitoring: Secondary | ICD-10-CM | POA: Diagnosis present

## 2019-11-08 DIAGNOSIS — I480 Paroxysmal atrial fibrillation: Secondary | ICD-10-CM | POA: Diagnosis present

## 2019-11-08 LAB — BASIC METABOLIC PANEL
Anion gap: 14 (ref 5–15)
BUN: 43 mg/dL — ABNORMAL HIGH (ref 8–23)
CO2: 27 mmol/L (ref 22–32)
Calcium: 9.3 mg/dL (ref 8.9–10.3)
Chloride: 101 mmol/L (ref 98–111)
Creatinine, Ser: 1.49 mg/dL — ABNORMAL HIGH (ref 0.44–1.00)
GFR calc Af Amer: 37 mL/min — ABNORMAL LOW (ref >60)
GFR calc non Af Amer: 32 mL/min — ABNORMAL LOW (ref >60)
Glucose, Bld: 125 mg/dL — ABNORMAL HIGH (ref 70–99)
Potassium: 4.1 mmol/L (ref 3.5–5.1)
Sodium: 142 mmol/L (ref 135–145)

## 2019-11-08 LAB — CBC
HCT: 41 % (ref 36.0–46.0)
Hemoglobin: 13.6 g/dL (ref 12.0–15.0)
MCH: 31.9 pg (ref 26.0–34.0)
MCHC: 33.2 g/dL (ref 30.0–36.0)
MCV: 96.2 fL (ref 80.0–100.0)
Platelets: 190 10*3/uL (ref 150–400)
RBC: 4.26 MIL/uL (ref 3.87–5.11)
RDW: 13.4 % (ref 11.5–15.5)
WBC: 8.7 10*3/uL (ref 4.0–10.5)
nRBC: 0 % (ref 0.0–0.2)

## 2019-11-08 NOTE — Telephone Encounter (Signed)
Patient informed. Copy sent to PCP °

## 2019-11-08 NOTE — Telephone Encounter (Signed)
-----   Message from Satira Sark, MD sent at 11/08/2019 12:42 PM EDT ----- Results reviewed.  Renal function is relatively stable, hemoglobin is normal at 13.6.  Continue current dose of Eliquis.

## 2019-11-09 ENCOUNTER — Other Ambulatory Visit: Payer: Self-pay | Admitting: Cardiology

## 2019-11-14 ENCOUNTER — Telehealth: Payer: Self-pay | Admitting: *Deleted

## 2019-11-14 NOTE — Telephone Encounter (Signed)
Spoke with pt.  She was scheduled for Eliquis f/u tomorrow but she already had lab work, it was reviewed by Dr Domenic Polite and she was notified of results by Georgina Peer RN.  Therefore, appt was cancelled for tomorrow,  She has a f/u appt with Dr Domenic Polite 02/2020,  Will obtain CBC/BMP then for continued Eliquis monitoring.

## 2019-11-14 NOTE — Telephone Encounter (Signed)
Patient called in regards to an upcoming appointment with Edrick Oh, RN -

## 2019-12-09 ENCOUNTER — Other Ambulatory Visit: Payer: Self-pay | Admitting: Cardiology

## 2020-01-15 ENCOUNTER — Ambulatory Visit (INDEPENDENT_AMBULATORY_CARE_PROVIDER_SITE_OTHER): Payer: Medicare PPO | Admitting: Emergency Medicine

## 2020-01-15 DIAGNOSIS — I472 Ventricular tachycardia: Secondary | ICD-10-CM | POA: Diagnosis not present

## 2020-01-15 DIAGNOSIS — I4729 Other ventricular tachycardia: Secondary | ICD-10-CM

## 2020-01-16 LAB — CUP PACEART REMOTE DEVICE CHECK
Battery Remaining Longevity: 16 mo
Battery Voltage: 2.89 V
Brady Statistic AP VP Percent: 90.02 %
Brady Statistic AP VS Percent: 0.01 %
Brady Statistic AS VP Percent: 9.96 %
Brady Statistic AS VS Percent: 0.01 %
Brady Statistic RA Percent Paced: 88.86 %
Brady Statistic RV Percent Paced: 99.88 %
Date Time Interrogation Session: 20210927102405
HighPow Impedance: 56 Ohm
HighPow Impedance: 73 Ohm
Implantable Lead Implant Date: 20030823
Implantable Lead Implant Date: 20030823
Implantable Lead Location: 753859
Implantable Lead Location: 753860
Implantable Lead Model: 157
Implantable Lead Model: 4086
Implantable Lead Serial Number: 108215
Implantable Lead Serial Number: 112190
Implantable Pulse Generator Implant Date: 20150608
Lead Channel Impedance Value: 342 Ohm
Lead Channel Impedance Value: 342 Ohm
Lead Channel Impedance Value: 418 Ohm
Lead Channel Pacing Threshold Amplitude: 0.75 V
Lead Channel Pacing Threshold Amplitude: 1.25 V
Lead Channel Pacing Threshold Pulse Width: 0.4 ms
Lead Channel Pacing Threshold Pulse Width: 0.4 ms
Lead Channel Sensing Intrinsic Amplitude: 10 mV
Lead Channel Sensing Intrinsic Amplitude: 10 mV
Lead Channel Sensing Intrinsic Amplitude: 2.25 mV
Lead Channel Sensing Intrinsic Amplitude: 2.25 mV
Lead Channel Setting Pacing Amplitude: 2 V
Lead Channel Setting Pacing Amplitude: 2.5 V
Lead Channel Setting Pacing Pulse Width: 0.4 ms
Lead Channel Setting Sensing Sensitivity: 0.3 mV

## 2020-01-18 NOTE — Progress Notes (Signed)
Remote ICD transmission.   

## 2020-02-05 ENCOUNTER — Other Ambulatory Visit: Payer: Self-pay | Admitting: Internal Medicine

## 2020-02-15 ENCOUNTER — Other Ambulatory Visit: Payer: Self-pay | Admitting: Cardiology

## 2020-02-20 ENCOUNTER — Ambulatory Visit: Payer: Medicare PPO | Admitting: Cardiology

## 2020-02-20 ENCOUNTER — Encounter: Payer: Self-pay | Admitting: Cardiology

## 2020-02-20 ENCOUNTER — Encounter: Payer: Self-pay | Admitting: *Deleted

## 2020-02-20 VITALS — BP 148/72 | HR 85 | Ht 60.0 in | Wt 164.0 lb

## 2020-02-20 DIAGNOSIS — I25119 Atherosclerotic heart disease of native coronary artery with unspecified angina pectoris: Secondary | ICD-10-CM | POA: Diagnosis not present

## 2020-02-20 DIAGNOSIS — R0600 Dyspnea, unspecified: Secondary | ICD-10-CM

## 2020-02-20 DIAGNOSIS — Z95810 Presence of automatic (implantable) cardiac defibrillator: Secondary | ICD-10-CM | POA: Diagnosis not present

## 2020-02-20 DIAGNOSIS — I4819 Other persistent atrial fibrillation: Secondary | ICD-10-CM

## 2020-02-20 DIAGNOSIS — R0609 Other forms of dyspnea: Secondary | ICD-10-CM

## 2020-02-20 NOTE — Patient Instructions (Addendum)
Medication Instructions:    Your physician recommends that you continue on your current medications as directed. Please refer to the Current Medication list given to you today.  Labwork:  None  Testing/Procedures:  None  Follow-Up:  Your physician recommends that you schedule a follow-up appointment in: 6 months in [place].   Any Other Special Instructions Will Be Listed Below (If Applicable).  If you need a refill on your cardiac medications before your next appointment, please call your pharmacy.

## 2020-02-20 NOTE — Progress Notes (Signed)
Cardiology Office Note  Date: 02/20/2020   ID: [name], [name] 08-07-1931, MRN [id number]  PCP:  Asencion Noble, MD  Cardiologist:  Rozann Lesches, MD Electrophysiologist:  [name], MD   Chief Complaint  Patient presents with  . Cardiac follow-up    History of Present Illness: [name] [name] is an 86 y.o. female last seen in July.  She presents for a follow-up visit.  Reports dyspnea on exertion at NYHA class II-III.  No definite angina or palpitations however.  We have discussed her testing over time and likelihood that her shortness of breath is multifactorial in nature.  We have also discussed a possible cardiac catheterization if symptoms escalate, but are holding off on this if possible.  She sees Dr. Lovena Le, Medtronic ICD in place.  Device check in September revealed normal function, only 3.2% AF burden which had decreased.  Short episode of NSVT noted.  I reviewed her lab work from July as noted below.  She just had repeat lab work done for Dr. Willey Blade pending initial visit next week.  We are requesting the results.  I reviewed her medications which are outlined below.  She does not report any obvious bleeding problems on Eliquis.  Past Medical History:  Diagnosis Date  . Anxiety   . Chronic atrial fibrillation ([place])   . Chronic back pain   . Chronic diastolic heart failure ([place])   . Chronic obstructive pulmonary disease, unspecified ([place])   . Complete heart block ([place])   . COPD (chronic obstructive pulmonary disease) ([place])   . Coronary atherosclerosis of native coronary artery    Multivessel status post CABG, DES PLA March 2006  . DDD (degenerative disc disease), lumbar   . Essential hypertension   . Headache   . ICD (implantable cardioverter-defibrillator) in place   . Mixed hyperlipidemia   . Osteoarthritis   . Ventricular fibrillation ([place]) 2003   a. seen on PPM interrogation a/w syncope    Past Surgical History:  Procedure Laterality Date  . CARDIAC  DEFIBRILLATOR PLACEMENT     MDT dual chamber ICD  . CARDIOVERSION N/A 08/09/2014   Procedure: CARDIOVERSION;  Surgeon: Sanda Klein, MD;  [facility];  Service: Cardiovascular;  Laterality: N/A;  . CORONARY ARTERY BYPASS GRAFT     LIMA to LAD, SVG to diagonal, SVG to ramus and OM  . IMPLANTABLE CARDIOVERTER DEFIBRILLATOR GENERATOR CHANGE N/A 09/25/2013   Procedure: IMPLANTABLE CARDIOVERTER DEFIBRILLATOR GENERATOR CHANGE;  Surgeon: Evans Lance, MD;  Location: [facility] CATH LAB;  Service: Cardiovascular;  Laterality: N/A;  . LEAD REVISION N/A 09/29/2013   Procedure: LEAD REVISION;  Surgeon: Deboraha Sprang, MD;  Location: [facility] CATH LAB;  Service: Cardiovascular;  Laterality: N/A;  . TONSILLECTOMY    . YAG LASER APPLICATION Left 04/27/15   Procedure: YAG LASER APPLICATION;  Surgeon: Elta Guadeloupe T. Gershon Crane, MD;  Location: [facility];  Service: Ophthalmology;  Laterality: Left;  . YAG LASER APPLICATION Right 4/94/4967   Procedure: YAG LASER APPLICATION;  Surgeon: Elta Guadeloupe T. Gershon Crane, MD;  Location: [facility];  Service: Ophthalmology;  Laterality: Right;    Current Outpatient Medications  Medication Sig Dispense Refill  . acetaminophen (TYLENOL) 500 MG tablet Take 500 mg by mouth every morning.     [name] albuterol (PROVENTIL HFA;VENTOLIN HFA) 108 (90 BASE) MCG/ACT inhaler Inhale 2 puffs into the lungs every 6 (six) hours as needed for wheezing or shortness of breath. 1 Inhaler 2  . amLODipine (NORVASC) 5 MG tablet TAKE (1) TABLET  BY MOUTH ONCE DAILY. 30 tablet 11  . atorvastatin (LIPITOR) 20 MG tablet TAKE ONE TABLET BY MOUTH ONCE DAILY. 90 tablet 3  . Calcium Carbonate-Vitamin D (CALCIUM 600 + D PO) Take 1 tablet by mouth 2 (two) times daily.    . Cholecalciferol (VITAMIN D) 2000 UNITS CAPS Take 1 capsule by mouth at bedtime.     . dofetilide (TIKOSYN) 125 MCG capsule TAKE (1) CAPSULE BY MOUTH TWICE DAILY. 180 capsule 0  . ELIQUIS 5 MG TABS tablet TAKE (1) TABLET BY MOUTH TWICE DAILY. 259 tablet 3  . folic acid  (FOLVITE) 1 MG tablet Take 1 mg by mouth 2 (two) times daily.     [name] losartan (COZAAR) 100 MG tablet TAKE ONE TABLET BY MOUTH DAILY. 90 tablet 3  . metoprolol succinate (TOPROL-XL) 100 MG 24 hr tablet TAKE ONE TABLET BY MOUTH EVERY MORNING. TAKE WITH OR IMMEDIATELY FOLLOWING A MEAL. 90 tablet 3  . Polyethyl Glycol-Propyl Glycol (SYSTANE OP) Place 1 drop into both eyes daily as needed (Dry eyes).    . potassium chloride SA (KLOR-CON) 20 MEQ tablet TAKE (1) TABLET BY MOUTH 3 TIMES DAILY. 270 tablet 0  . torsemide (DEMADEX) 20 MG tablet Take 3 tablets (60 mg total) by mouth daily. 270 tablet 3   No current facility-administered medications for this visit.   Allergies:  Codeine and Keflex [cephalexin]   ROS: No syncope.  Physical Exam: VS:  BP (!) 148/72   Pulse 85   Ht 5' (1.524 m)   Wt 164 lb (74.4 kg)   SpO2 95%   BMI 32.03 kg/m , BMI Body mass index is 32.03 kg/m.  Wt Readings from Last 3 Encounters:  02/20/20 164 lb (74.4 kg)  10/19/19 163 lb (73.9 kg)  09/04/19 165 lb (74.8 kg)    General: Elderly woman, appears comfortable at rest. HEENT: Conjunctiva and lids normal, wearing a mask. Neck: Supple, no elevated JVP or carotid bruits, no thyromegaly. Lungs: Clear to auscultation, nonlabored breathing at rest. Cardiac: Regular rate and rhythm, no S3, 2/6 systolic murmur, no pericardial rub. Extremities: Mild ankle edema.  ECG:  An ECG dated 09/04/2019 was personally reviewed today and demonstrated:  Dual-chamber pacing.  Recent Labwork: 11/08/2019: BUN 43; Creatinine, Ser 1.49; Hemoglobin 13.6; Platelets 190; Potassium 4.1; Sodium 142   Other Studies Reviewed Today:  Lexiscan Myoview 09/19/2019:  The study is normal. There are no perfusion defects  This is a low risk study.  The left ventricular ejection fraction is normal (55-65%).  AV paced throughout the study  Echocardiogram 09/20/2019: 1. Left ventricular ejection fraction, by estimation, is 60 to 65%. The  left  ventricle has normal function. The left ventricle has no regional  wall motion abnormalities. Left ventricular diastolic parameters are  consistent with Grade II diastolic  dysfunction (pseudonormalization). Elevated left atrial pressure.  2. Right ventricular systolic function is normal. The right ventricular  size is normal. There is moderately elevated pulmonary artery systolic  pressure.  3. Left atrial size was severely dilated.  4. Right atrial size was severely dilated.  5. The mitral valve is normal in structure. Mild mitral valve  regurgitation. No evidence of mitral stenosis.  6. The aortic valve was not well visualized. Aortic valve regurgitation  is not visualized. Mild aortic valve stenosis. Aortic valve mean gradient  measures 10.7 mmHg. Aortic valve peak gradient measures 18.7 mmHg. Aortic  valve area, by VTI measures 1.50  cm.  7. PASP is 42 mmHg, mild to moderate  pulmonary HTN.  8. The inferior vena cava is normal in size with greater than 50%  respiratory variability, suggesting right atrial pressure of 3 mmHg.   Assessment and Plan:  1.  Multifactorial dyspnea on exertion.  Ischemic testing from June was reassuring and LVEF remains normal.  She does have moderate diastolic dysfunction with increased filling pressures and we continue on Demadex for treatment of diastolic heart failure.  Atrial fibrillation control looks good by most recent device interrogation.  She also has a reported history of COPD which could be a potential contributor.  For now would continue current course.  Requesting interval lab work for review as she establishes with Dr. Willey Blade.  2.  Persistent atrial fibrillation with CHA2DS2-VASc score of 5.  Continue Eliquis for stroke prophylaxis.  No reported bleeding problems.  Requesting interval lab work for review.  She remains on Tikosyn and Toprol-XL as well.  3.  Medtronic ICD in place with follow-up by Dr. Lovena Le.  4.  CAD status post CABG  as well as DES to the PLA.  No significant ischemia by Myoview in June.  Continue statin therapy.  Medication Adjustments/Labs and Tests Ordered: Current medicines are reviewed at length with the patient today.  Concerns regarding medicines are outlined above.   Tests Ordered: No orders of the defined types were placed in this encounter.   Medication Changes: No orders of the defined types were placed in this encounter.   Disposition:  Follow up 6 months in the [place] office.  Signed, Satira Sark, MD, [facility] 02/20/2020 2:55 PM    [facility] [address], [place], [place] [address] Phone: [phone]; Fax: [phone]

## 2020-02-21 ENCOUNTER — Telehealth: Payer: Self-pay | Admitting: *Deleted

## 2020-02-21 MED ORDER — APIXABAN 2.5 MG PO TABS: 2.5000 mg | ORAL_TABLET | Freq: Two times a day (BID) | ORAL | 6 refills | Status: DC

## 2020-02-21 NOTE — Telephone Encounter (Signed)
Patient informed and verbalized understanding of plan. 

## 2020-02-21 NOTE — Telephone Encounter (Signed)
-----   Message from Satira Sark, MD sent at 02/21/2020  9:59 AM EDT ----- Results reviewed.  Based on recent lab work and creatinine 1.51, would suggest reducing Eliquis to 2.5 mg twice daily.

## 2020-03-08 ENCOUNTER — Other Ambulatory Visit: Payer: Self-pay

## 2020-03-08 ENCOUNTER — Ambulatory Visit: Payer: Medicare PPO | Admitting: Internal Medicine

## 2020-03-08 ENCOUNTER — Encounter: Payer: Self-pay | Admitting: Internal Medicine

## 2020-03-08 VITALS — BP 146/68 | HR 83 | Ht 60.0 in | Wt 164.0 lb

## 2020-03-08 DIAGNOSIS — I472 Ventricular tachycardia: Secondary | ICD-10-CM | POA: Diagnosis not present

## 2020-03-08 DIAGNOSIS — I4729 Other ventricular tachycardia: Secondary | ICD-10-CM

## 2020-03-08 LAB — CUP PACEART INCLINIC DEVICE CHECK
Battery Remaining Longevity: 15 mo
Battery Voltage: 2.89 V
Brady Statistic AP VP Percent: 63.73 %
Brady Statistic AP VS Percent: 0.01 %
Brady Statistic AS VP Percent: 36.23 %
Brady Statistic AS VS Percent: 0.04 %
Brady Statistic RA Percent Paced: 61.81 %
Brady Statistic RV Percent Paced: 99.86 %
Date Time Interrogation Session: 20211119120650
HighPow Impedance: 56 Ohm
HighPow Impedance: 78 Ohm
Implantable Lead Implant Date: 20030823
Implantable Lead Implant Date: 20030823
Implantable Lead Location: 753859
Implantable Lead Location: 753860
Implantable Lead Model: 157
Implantable Lead Model: 4086
Implantable Lead Serial Number: 108215
Implantable Lead Serial Number: 112190
Implantable Pulse Generator Implant Date: 20150608
Lead Channel Impedance Value: 361 Ohm
Lead Channel Impedance Value: 399 Ohm
Lead Channel Impedance Value: 418 Ohm
Lead Channel Pacing Threshold Amplitude: 0.5 V
Lead Channel Pacing Threshold Amplitude: 1.125 V
Lead Channel Pacing Threshold Pulse Width: 0.4 ms
Lead Channel Pacing Threshold Pulse Width: 0.4 ms
Lead Channel Sensing Intrinsic Amplitude: 1.4 mV
Lead Channel Setting Pacing Amplitude: 2 V
Lead Channel Setting Pacing Amplitude: 2.75 V
Lead Channel Setting Pacing Pulse Width: 0.4 ms
Lead Channel Setting Sensing Sensitivity: 0.3 mV

## 2020-03-08 NOTE — Progress Notes (Signed)
HPI [name] returns today for followup. She is a pleasant 86 yo woman with a h/o chronic systolic heart failure, persistent atrial fib, CHB, s/p PPM, VT, s/p ICD and asthma. She has done better in the past several months. She does not feel palpitations but her symptoms are improved. No ICD shock. She has mild peripheral edema. She admits to sodium indiscretion.  Allergies  Allergen Reactions  . Codeine Nausea And Vomiting  . Keflex [Cephalexin] Itching and Rash     Current Outpatient Medications  Medication Sig Dispense Refill  . acetaminophen (TYLENOL) 500 MG tablet Take 500 mg by mouth every morning.     [name] albuterol (PROVENTIL HFA;VENTOLIN HFA) 108 (90 BASE) MCG/ACT inhaler Inhale 2 puffs into the lungs every 6 (six) hours as needed for wheezing or shortness of breath. 1 Inhaler 2  . amLODipine (NORVASC) 5 MG tablet TAKE (1) TABLET BY MOUTH ONCE DAILY. 30 tablet 11  . apixaban (ELIQUIS) 2.5 MG TABS tablet Take 1 tablet (2.5 mg total) by mouth 2 (two) times daily. 60 tablet 6  . atorvastatin (LIPITOR) 20 MG tablet TAKE ONE TABLET BY MOUTH ONCE DAILY. 90 tablet 3  . Calcium Carbonate-Vitamin D (CALCIUM 600 + D PO) Take 1 tablet by mouth 2 (two) times daily.    . Cholecalciferol (VITAMIN D) 2000 UNITS CAPS Take 1 capsule by mouth at bedtime.     . dofetilide (TIKOSYN) 125 MCG capsule TAKE (1) CAPSULE BY MOUTH TWICE DAILY. 742 capsule 0  . folic acid (FOLVITE) 1 MG tablet Take 1 mg by mouth 2 (two) times daily.     [name] losartan (COZAAR) 100 MG tablet TAKE ONE TABLET BY MOUTH DAILY. 90 tablet 3  . metoprolol succinate (TOPROL-XL) 100 MG 24 hr tablet TAKE ONE TABLET BY MOUTH EVERY MORNING. TAKE WITH OR IMMEDIATELY FOLLOWING A MEAL. 90 tablet 3  . Polyethyl Glycol-Propyl Glycol (SYSTANE OP) Place 1 drop into both eyes daily as needed (Dry eyes).    . potassium chloride SA (KLOR-CON) 20 MEQ tablet TAKE (1) TABLET BY MOUTH 3 TIMES DAILY. 270 tablet 0  . torsemide (DEMADEX) 20 MG tablet  Take 3 tablets (60 mg total) by mouth daily. 270 tablet 3   No current facility-administered medications for this visit.     Past Medical History:  Diagnosis Date  . Anxiety   . Chronic atrial fibrillation ([place])   . Chronic back pain   . Chronic diastolic heart failure ([place])   . Chronic obstructive pulmonary disease, unspecified ([place])   . Complete heart block ([place])   . COPD (chronic obstructive pulmonary disease) ([place])   . Coronary atherosclerosis of native coronary artery    Multivessel status post CABG, DES PLA March 2006  . DDD (degenerative disc disease), lumbar   . Essential hypertension   . Headache   . ICD (implantable cardioverter-defibrillator) in place   . Mixed hyperlipidemia   . Osteoarthritis   . Ventricular fibrillation ([place]) 2003   a. seen on PPM interrogation a/w syncope    ROS:   All systems reviewed and negative except as noted in the HPI.   Past Surgical History:  Procedure Laterality Date  . CARDIAC DEFIBRILLATOR PLACEMENT     MDT dual chamber ICD  . CARDIOVERSION N/A 08/09/2014   Procedure: CARDIOVERSION;  Surgeon: Sanda Klein, MD;  [facility]: [facility];  Service: Cardiovascular;  Laterality: N/A;  . CORONARY ARTERY BYPASS GRAFT     LIMA to LAD, SVG to diagonal, SVG  to ramus and OM  . IMPLANTABLE CARDIOVERTER DEFIBRILLATOR GENERATOR CHANGE N/A 09/25/2013   Procedure: IMPLANTABLE CARDIOVERTER DEFIBRILLATOR GENERATOR CHANGE;  Surgeon: Evans Lance, MD;  Location: [facility] CATH LAB;  Service: Cardiovascular;  Laterality: N/A;  . LEAD REVISION N/A 09/29/2013   Procedure: LEAD REVISION;  Surgeon: Deboraha Sprang, MD;  Location: [facility] CATH LAB;  Service: Cardiovascular;  Laterality: N/A;  . TONSILLECTOMY    . YAG LASER APPLICATION Left 05/28/3660   Procedure: YAG LASER APPLICATION;  Surgeon: Elta Guadeloupe T. Gershon Crane, MD;  Location: [facility];  Service: Ophthalmology;  Laterality: Left;  . YAG LASER APPLICATION Right 9/47/6546   Procedure: YAG LASER APPLICATION;  Surgeon:  Elta Guadeloupe T. Gershon Crane, MD;  Location: [facility];  Service: Ophthalmology;  Laterality: Right;     Family History  Problem Relation Age of Onset  . Heart attack Father   . Heart attack Brother      Social History   Socioeconomic History  . Marital status: Widowed    Spouse name: Not on file  . Number of children: 2  . Years of education: Not on file  . Highest education level: Not on file  Occupational History  . Occupation: Retired    Comment: Surveyor, quantity: RETIRED  Tobacco Use  . Smoking status: Never Smoker  . Smokeless tobacco: Never Used  Vaping Use  . Vaping Use: Never used  Substance and Sexual Activity  . Alcohol use: No    Alcohol/week: 0.0 standard drinks  . Drug use: No  . Sexual activity: Never  Other Topics Concern  . Not on file  Social History Narrative  . Not on file   Social Determinants of Health   Financial Resource Strain:   . Difficulty of Paying Living Expenses: Not on file  Food Insecurity:   . Worried About Charity fundraiser in the Last Year: Not on file  . Ran Out of Food in the Last Year: Not on file  Transportation Needs:   . Lack of Transportation (Medical): Not on file  . Lack of Transportation (Non-Medical): Not on file  Physical Activity:   . Days of Exercise per Week: Not on file  . Minutes of Exercise per Session: Not on file  Stress:   . Feeling of Stress : Not on file  Social Connections:   . Frequency of Communication with Friends and Family: Not on file  . Frequency of Social Gatherings with Friends and Family: Not on file  . Attends Religious Services: Not on file  . Active Member of Clubs or Organizations: Not on file  . Attends Archivist Meetings: Not on file  . Marital Status: Not on file  Intimate Partner Violence:   . Fear of Current or Ex-Partner: Not on file  . Emotionally Abused: Not on file  . Physically Abused: Not on file  . Sexually Abused: Not on file     BP (!) 146/68   Pulse 83    Ht 5' (1.524 m)   Wt 164 lb (74.4 kg)   SpO2 95%   BMI 32.03 kg/m   Physical Exam:  Well appearing elderly woman, NAD HEENT: Unremarkable Neck:  No JVD, no thyromegally Lymphatics:  No adenopathy Back:  No CVA tenderness Lungs:  Clear with no wheezes HEART:  Regular rate rhythm, no murmurs, no rubs, no clicks Abd:  soft, positive bowel sounds, no organomegally, no rebound, no guarding Ext:  2 plus pulses, no edema, no cyanosis, no clubbing  Skin:  No rashes no nodules Neuro:  CN II through XII intact, motor grossly intact  DEVICE  Normal device function.  See PaceArt for details.   Assess/Plan: 1. VT - she has only had NSVT since her last visit. 2. Persistent atrial fib - her burden is much improved. She will continue dofetilide 3. CHB - she is asymptomatic, s/p PPM 4. HFPEF - she has class 2 symptoms. We will follow  Salome Spotted

## 2020-03-08 NOTE — Patient Instructions (Signed)
Medication Instructions:  Your physician recommends that you continue on your current medications as directed. Please refer to the Current Medication list given to you today.  *If you need a refill on your cardiac medications before your next appointment, please call your pharmacy*   Lab Work: None today If you have labs (blood work) drawn today and your tests are completely normal, you will receive your results only by: [name] MyChart Message (if you have MyChart) OR . A paper copy in the mail If you have any lab test that is abnormal or we need to change your treatment, we will call you to review the results.   Testing/Procedures: None today   Follow-Up: At [facility], you and your health needs are our priority.  As part of our continuing mission to provide you with exceptional heart care, we have created designated Provider Care Teams.  These Care Teams include your primary Cardiologist (physician) and Advanced Practice Providers (APPs -  Physician Assistants and Nurse Practitioners) who all work together to provide you with the care you need, when you need it.  We recommend signing up for the patient portal called "MyChart".  Sign up information is provided on this After Visit Summary.  MyChart is used to connect with patients for Virtual Visits (Telemedicine).  Patients are able to view lab/test results, encounter notes, upcoming appointments, etc.  Non-urgent messages can be sent to your provider as well.   To learn more about what you can do with MyChart, go to [email or web address].    Your next appointment:   12 month(s)  The format for your next appointment:   In Person  Provider:   Bernerd Pho, PA-C   Other Instructions None       Thank you for choosing [facility] !

## 2020-03-20 ENCOUNTER — Other Ambulatory Visit: Payer: Self-pay | Admitting: Cardiology

## 2020-04-15 ENCOUNTER — Ambulatory Visit (INDEPENDENT_AMBULATORY_CARE_PROVIDER_SITE_OTHER): Payer: Medicare PPO

## 2020-04-15 DIAGNOSIS — I472 Ventricular tachycardia: Secondary | ICD-10-CM | POA: Diagnosis not present

## 2020-04-15 DIAGNOSIS — I4729 Other ventricular tachycardia: Secondary | ICD-10-CM

## 2020-04-16 LAB — CUP PACEART REMOTE DEVICE CHECK
Battery Remaining Longevity: 14 mo
Battery Voltage: 2.87 V
Brady Statistic AP VP Percent: 89.17 %
Brady Statistic AP VS Percent: 0.03 %
Brady Statistic AS VP Percent: 10.78 %
Brady Statistic AS VS Percent: 0.02 %
Brady Statistic RA Percent Paced: 87.27 %
Brady Statistic RV Percent Paced: 99.74 %
Date Time Interrogation Session: 20211227063427
HighPow Impedance: 60 Ohm
HighPow Impedance: 80 Ohm
Implantable Lead Implant Date: 20030823
Implantable Lead Implant Date: 20030823
Implantable Lead Location: 753859
Implantable Lead Location: 753860
Implantable Lead Model: 157
Implantable Lead Model: 4086
Implantable Lead Serial Number: 108215
Implantable Lead Serial Number: 112190
Implantable Pulse Generator Implant Date: 20150608
Lead Channel Impedance Value: 361 Ohm
Lead Channel Impedance Value: 361 Ohm
Lead Channel Impedance Value: 418 Ohm
Lead Channel Pacing Threshold Amplitude: 0.75 V
Lead Channel Pacing Threshold Amplitude: 1.25 V
Lead Channel Pacing Threshold Pulse Width: 0.4 ms
Lead Channel Pacing Threshold Pulse Width: 0.4 ms
Lead Channel Sensing Intrinsic Amplitude: 0.375 mV
Lead Channel Sensing Intrinsic Amplitude: 0.375 mV
Lead Channel Sensing Intrinsic Amplitude: 11.625 mV
Lead Channel Sensing Intrinsic Amplitude: 11.625 mV
Lead Channel Setting Pacing Amplitude: 2 V
Lead Channel Setting Pacing Amplitude: 2.5 V
Lead Channel Setting Pacing Pulse Width: 0.4 ms
Lead Channel Setting Sensing Sensitivity: 0.3 mV

## 2020-04-26 NOTE — Progress Notes (Signed)
Remote ICD transmission.   

## 2020-05-03 ENCOUNTER — Other Ambulatory Visit: Payer: Self-pay | Admitting: Internal Medicine

## 2020-06-15 ENCOUNTER — Other Ambulatory Visit: Payer: Self-pay | Admitting: Cardiology

## 2020-07-06 ENCOUNTER — Other Ambulatory Visit: Payer: Self-pay | Admitting: Cardiology

## 2020-07-15 ENCOUNTER — Ambulatory Visit (INDEPENDENT_AMBULATORY_CARE_PROVIDER_SITE_OTHER): Payer: Medicare PPO

## 2020-07-15 DIAGNOSIS — I480 Paroxysmal atrial fibrillation: Secondary | ICD-10-CM | POA: Diagnosis not present

## 2020-07-15 LAB — CUP PACEART REMOTE DEVICE CHECK
Battery Remaining Longevity: 9 mo
Battery Voltage: 2.85 V
Brady Statistic AP VP Percent: 51.48 %
Brady Statistic AP VS Percent: 0.01 %
Brady Statistic AS VP Percent: 48.41 %
Brady Statistic AS VS Percent: 0.09 %
Brady Statistic RA Percent Paced: 47.89 %
Brady Statistic RV Percent Paced: 99.69 %
Date Time Interrogation Session: 20220328061607
HighPow Impedance: 60 Ohm
HighPow Impedance: 80 Ohm
Implantable Lead Implant Date: 20030823
Implantable Lead Implant Date: 20030823
Implantable Lead Location: 753859
Implantable Lead Location: 753860
Implantable Lead Model: 157
Implantable Lead Model: 4086
Implantable Lead Serial Number: 108215
Implantable Lead Serial Number: 112190
Implantable Pulse Generator Implant Date: 20150608
Lead Channel Impedance Value: 361 Ohm
Lead Channel Impedance Value: 361 Ohm
Lead Channel Impedance Value: 418 Ohm
Lead Channel Pacing Threshold Amplitude: 0.75 V
Lead Channel Pacing Threshold Amplitude: 1.25 V
Lead Channel Pacing Threshold Pulse Width: 0.4 ms
Lead Channel Pacing Threshold Pulse Width: 0.4 ms
Lead Channel Sensing Intrinsic Amplitude: 11.625 mV
Lead Channel Sensing Intrinsic Amplitude: 11.625 mV
Lead Channel Sensing Intrinsic Amplitude: 2.5 mV
Lead Channel Sensing Intrinsic Amplitude: 2.5 mV
Lead Channel Setting Pacing Amplitude: 2 V
Lead Channel Setting Pacing Amplitude: 2.75 V
Lead Channel Setting Pacing Pulse Width: 0.4 ms
Lead Channel Setting Sensing Sensitivity: 0.3 mV

## 2020-07-16 ENCOUNTER — Telehealth: Payer: Self-pay

## 2020-07-16 NOTE — Telephone Encounter (Signed)
Scheduled remote received- Device estimated 9 months until ERI.  Remote transmission frequency updated, pt notified next scheduled transmission is 08/16/20.

## 2020-07-18 ENCOUNTER — Other Ambulatory Visit: Payer: Self-pay | Admitting: Cardiology

## 2020-07-29 NOTE — Progress Notes (Signed)
Remote ICD transmission.   

## 2020-08-15 DIAGNOSIS — Z08 Encounter for follow-up examination after completed treatment for malignant neoplasm: Secondary | ICD-10-CM | POA: Diagnosis not present

## 2020-08-15 DIAGNOSIS — L570 Actinic keratosis: Secondary | ICD-10-CM | POA: Diagnosis not present

## 2020-08-15 DIAGNOSIS — L308 Other specified dermatitis: Secondary | ICD-10-CM | POA: Diagnosis not present

## 2020-08-15 DIAGNOSIS — Z85828 Personal history of other malignant neoplasm of skin: Secondary | ICD-10-CM | POA: Diagnosis not present

## 2020-08-15 DIAGNOSIS — X32XXXD Exposure to sunlight, subsequent encounter: Secondary | ICD-10-CM | POA: Diagnosis not present

## 2020-08-16 ENCOUNTER — Telehealth: Payer: Self-pay

## 2020-08-16 ENCOUNTER — Ambulatory Visit (INDEPENDENT_AMBULATORY_CARE_PROVIDER_SITE_OTHER): Payer: Medicare PPO

## 2020-08-16 DIAGNOSIS — I4729 Other ventricular tachycardia: Secondary | ICD-10-CM

## 2020-08-16 DIAGNOSIS — I472 Ventricular tachycardia: Secondary | ICD-10-CM

## 2020-08-16 NOTE — Telephone Encounter (Signed)
The patient trying to send a transmission but is having difficulty. I called tech support to get her additional help. [employer] is sending the patient a new handheld for her monitor. She should receive it in 7-10 business days. I told her to let the handheld charge for an hour when she get it. Then call me and I will help her send a transmission.

## 2020-08-20 DIAGNOSIS — Z79899 Other long term (current) drug therapy: Secondary | ICD-10-CM | POA: Diagnosis not present

## 2020-08-20 DIAGNOSIS — I4891 Unspecified atrial fibrillation: Secondary | ICD-10-CM | POA: Diagnosis not present

## 2020-08-20 DIAGNOSIS — E785 Hyperlipidemia, unspecified: Secondary | ICD-10-CM | POA: Diagnosis not present

## 2020-08-20 DIAGNOSIS — I10 Essential (primary) hypertension: Secondary | ICD-10-CM | POA: Diagnosis not present

## 2020-08-20 DIAGNOSIS — N183 Chronic kidney disease, stage 3 unspecified: Secondary | ICD-10-CM | POA: Diagnosis not present

## 2020-08-20 DIAGNOSIS — I2510 Atherosclerotic heart disease of native coronary artery without angina pectoris: Secondary | ICD-10-CM | POA: Diagnosis not present

## 2020-08-20 DIAGNOSIS — I5032 Chronic diastolic (congestive) heart failure: Secondary | ICD-10-CM | POA: Diagnosis not present

## 2020-08-20 LAB — CUP PACEART REMOTE DEVICE CHECK
Battery Remaining Longevity: 9 mo
Battery Voltage: 2.85 V
Brady Statistic AP VP Percent: 25.3 %
Brady Statistic AP VS Percent: 0 %
Brady Statistic AS VP Percent: 74.52 %
Brady Statistic AS VS Percent: 0.18 %
Brady Statistic RA Percent Paced: 22.8 %
Brady Statistic RV Percent Paced: 99.49 %
Date Time Interrogation Session: 20220430032606
HighPow Impedance: 60 Ohm
HighPow Impedance: 75 Ohm
Implantable Lead Implant Date: 20030823
Implantable Lead Implant Date: 20030823
Implantable Lead Location: 753859
Implantable Lead Location: 753860
Implantable Lead Model: 157
Implantable Lead Model: 4086
Implantable Lead Serial Number: 108215
Implantable Lead Serial Number: 112190
Implantable Pulse Generator Implant Date: 20150608
Lead Channel Impedance Value: 342 Ohm
Lead Channel Impedance Value: 342 Ohm
Lead Channel Impedance Value: 418 Ohm
Lead Channel Pacing Threshold Amplitude: 0.75 V
Lead Channel Pacing Threshold Amplitude: 1.375 V
Lead Channel Pacing Threshold Pulse Width: 0.4 ms
Lead Channel Pacing Threshold Pulse Width: 0.4 ms
Lead Channel Sensing Intrinsic Amplitude: 11.625 mV
Lead Channel Sensing Intrinsic Amplitude: 11.625 mV
Lead Channel Sensing Intrinsic Amplitude: 2.375 mV
Lead Channel Sensing Intrinsic Amplitude: 2.375 mV
Lead Channel Setting Pacing Amplitude: 2 V
Lead Channel Setting Pacing Amplitude: 2.75 V
Lead Channel Setting Pacing Pulse Width: 0.4 ms
Lead Channel Setting Sensing Sensitivity: 0.3 mV

## 2020-08-26 DIAGNOSIS — I10 Essential (primary) hypertension: Secondary | ICD-10-CM | POA: Diagnosis not present

## 2020-08-26 DIAGNOSIS — R0989 Other specified symptoms and signs involving the circulatory and respiratory systems: Secondary | ICD-10-CM | POA: Diagnosis not present

## 2020-08-26 DIAGNOSIS — I5032 Chronic diastolic (congestive) heart failure: Secondary | ICD-10-CM | POA: Diagnosis not present

## 2020-08-26 DIAGNOSIS — R7309 Other abnormal glucose: Secondary | ICD-10-CM | POA: Diagnosis not present

## 2020-08-26 DIAGNOSIS — N1832 Chronic kidney disease, stage 3b: Secondary | ICD-10-CM | POA: Diagnosis not present

## 2020-08-27 ENCOUNTER — Other Ambulatory Visit: Payer: Self-pay | Admitting: Internal Medicine

## 2020-08-27 ENCOUNTER — Other Ambulatory Visit (HOSPITAL_COMMUNITY): Payer: Self-pay | Admitting: Internal Medicine

## 2020-08-27 DIAGNOSIS — R0989 Other specified symptoms and signs involving the circulatory and respiratory systems: Secondary | ICD-10-CM

## 2020-08-29 ENCOUNTER — Other Ambulatory Visit: Payer: Self-pay

## 2020-08-29 ENCOUNTER — Encounter: Payer: Self-pay | Admitting: Cardiology

## 2020-08-29 ENCOUNTER — Ambulatory Visit: Payer: Medicare PPO | Admitting: Cardiology

## 2020-08-29 VITALS — BP 136/66 | HR 66 | Ht 60.0 in | Wt 162.0 lb

## 2020-08-29 DIAGNOSIS — I25119 Atherosclerotic heart disease of native coronary artery with unspecified angina pectoris: Secondary | ICD-10-CM | POA: Diagnosis not present

## 2020-08-29 DIAGNOSIS — I480 Paroxysmal atrial fibrillation: Secondary | ICD-10-CM | POA: Diagnosis not present

## 2020-08-29 NOTE — Patient Instructions (Signed)

## 2020-08-29 NOTE — Progress Notes (Signed)
Cardiology Office Note  Date: 08/29/2020   ID: [name], [name] 12/04/1931, MRN [id number]  PCP:  Asencion Noble, MD  Cardiologist:  Rozann Lesches, MD Electrophysiologist:  [name], MD   Chief Complaint  Patient presents with  . Cardiac follow-up    History of Present Illness: [name] [name] is an 87 y.o. female last seen in November 2021.  She presents for a routine visit.  She has chronic dyspnea on exertion which is multifactorial.  She does not describe any progressive leg swelling, her weight is down 2 pounds, remains consistent with regular diuretic use.  Also no progressive atrial fibrillation with time on medical therapy, no angina symptoms.  She follows with Dr. Lovena Le, Medtronic ICD in place.  Last device check indicated approximately 20% AF burden.  I reviewed her medications which are outlined below.  She does not report any spontaneous bleeding problems on Eliquis.  I personally reviewed her ECG today which shows a ventricular paced rhythm with atrial tracking and intermittent atrial pacing.  We are requesting recent lab work from Dr. Willey Blade.  Past Medical History:  Diagnosis Date  . Anxiety   . Chronic atrial fibrillation ([place])   . Chronic back pain   . Chronic diastolic heart failure ([place])   . Chronic obstructive pulmonary disease, unspecified ([place])   . Complete heart block ([place])   . COPD (chronic obstructive pulmonary disease) ([place])   . Coronary atherosclerosis of native coronary artery    Multivessel status post CABG, DES PLA March 2006  . DDD (degenerative disc disease), lumbar   . Essential hypertension   . Headache   . ICD (implantable cardioverter-defibrillator) in place   . Mixed hyperlipidemia   . Osteoarthritis   . Ventricular fibrillation ([place]) 2003   a. seen on PPM interrogation a/w syncope    Past Surgical History:  Procedure Laterality Date  . CARDIAC DEFIBRILLATOR PLACEMENT     MDT dual chamber ICD  . CARDIOVERSION N/A 08/09/2014    Procedure: CARDIOVERSION;  Surgeon: Sanda Klein, MD;  [facility]: [facility];  Service: Cardiovascular;  Laterality: N/A;  . CORONARY ARTERY BYPASS GRAFT     LIMA to LAD, SVG to diagonal, SVG to ramus and OM  . IMPLANTABLE CARDIOVERTER DEFIBRILLATOR GENERATOR CHANGE N/A 09/25/2013   Procedure: IMPLANTABLE CARDIOVERTER DEFIBRILLATOR GENERATOR CHANGE;  Surgeon: Evans Lance, MD;  Location: [facility] C[facility] LAB;  Service: Cardiovascular;  Laterality: N/A;  . LEAD REVISION N/A 09/29/2013   Procedure: LEAD REVISION;  Surgeon: Deboraha Sprang, MD;  Location: [facility] CATH LAB;  Service: Cardiovascular;  Laterality: N/A;  . TONSILLECTOMY    . YAG LASER APPLICATION Left 05/26/7122   Procedure: YAG LASER APPLICATION;  Surgeon: Elta Guadeloupe T. Gershon Crane, MD;  Location: [facility];  Service: Ophthalmology;  Laterality: Left;  . YAG LASER APPLICATION Right 5/80/9983   Procedure: YAG LASER APPLICATION;  Surgeon: Elta Guadeloupe T. Gershon Crane, MD;  Location: [facility];  Service: Ophthalmology;  Laterality: Right;    Current Outpatient Medications  Medication Sig Dispense Refill  . amLODipine (NORVASC) 5 MG tablet TAKE (1) TABLET BY MOUTH ONCE DAILY. 30 tablet 11  . apixaban (ELIQUIS) 2.5 MG TABS tablet Take 1 tablet (2.5 mg total) by mouth 2 (two) times daily. 60 tablet 6  . atorvastatin (LIPITOR) 20 MG tablet TAKE ONE TABLET BY MOUTH ONCE DAILY. 90 tablet 2  . Calcium Carbonate-Vitamin D (CALCIUM 600 + D PO) Take 1 tablet by mouth 2 (two) times daily.    . Cholecalciferol (VITAMIN  D) 2000 UNITS CAPS Take 1 capsule by mouth at bedtime.    . dofetilide (TIKOSYN) 125 MCG capsule TAKE (1) CAPSULE BY MOUTH TWICE DAILY. 741 capsule 3  . folic acid (FOLVITE) 1 MG tablet Take 1 mg by mouth 2 (two) times daily.    [name] losartan (COZAAR) 100 MG tablet TAKE ONE TABLET BY MOUTH DAILY. 90 tablet 2  . metoprolol succinate (TOPROL-XL) 100 MG 24 hr tablet TAKE ONE TABLET BY MOUTH EVERY MORNING. TAKE WITH OR IMMEDIATELY FOLLOWING A MEAL. 90 tablet 2  . Polyethyl  Glycol-Propyl Glycol (SYSTANE OP) Place 1 drop into both eyes daily as needed (Dry eyes).    . potassium chloride SA (KLOR-CON) 20 MEQ tablet TAKE (1) TABLET BY MOUTH 3 TIMES DAILY. 90 tablet 11  . torsemide (DEMADEX) 20 MG tablet TAKE 3 TABLETS BY MOUTH DAILY. 270 tablet 3  . acetaminophen (TYLENOL) 500 MG tablet Take 500 mg by mouth every morning.     [name] albuterol (PROVENTIL HFA;VENTOLIN HFA) 108 (90 BASE) MCG/ACT inhaler Inhale 2 puffs into the lungs every 6 (six) hours as needed for wheezing or shortness of breath. 1 Inhaler 2   No current facility-administered medications for this visit.   Allergies:  Codeine and Keflex [cephalexin]   ROS: No dizziness or syncope.  Physical Exam: VS:  BP 136/66   Pulse 66   Ht 5' (1.524 m)   Wt 162 lb (73.5 kg)   SpO2 94%   BMI 31.64 kg/m , BMI Body mass index is 31.64 kg/m.  Wt Readings from Last 3 Encounters:  08/29/20 162 lb (73.5 kg)  03/08/20 164 lb (74.4 kg)  02/20/20 164 lb (74.4 kg)    General: Elderly woman, appears comfortable at rest. HEENT: Conjunctiva and lids normal, wearing a mask. Neck: Supple, no elevated JVP, soft left carotid bruit, no thyromegaly. Lungs: Clear to auscultation, nonlabored breathing at rest. Cardiac: Regular rate and rhythm, no S3, 2/6 systolic murmur. Abdomen: Soft, nontender, bowel sounds present. Extremities: Mild ankle edema, distal pulses 2+.  ECG:  An ECG dated 09/04/2019 was personally reviewed today and demonstrated:  Dual chamber pacing.  Recent Labwork: 11/08/2019: BUN 43; Creatinine, Ser 1.49; Hemoglobin 13.6; Platelets 190; Potassium 4.1; Sodium 142  November 2021: BUN 29, creatinine 1.51, potassium 4.0, AST 17, ALT 15, hemoglobin 13.9, platelets 187, cholesterol 128, triglycerides 136, HDL 41, LDL 63  Other Studies Reviewed Today:  Lexiscan Myoview 09/19/2019:  The study is normal. There are no perfusion defects  This is a low risk study.  The left ventricular ejection fraction is normal  (55-65%).  AV paced throughout the study  Echocardiogram 09/20/2019: 1. Left ventricular ejection fraction, by estimation, is 60 to 65%. The  left ventricle has normal function. The left ventricle has no regional  wall motion abnormalities. Left ventricular diastolic parameters are  consistent with Grade II diastolic  dysfunction (pseudonormalization). Elevated left atrial pressure.  2. Right ventricular systolic function is normal. The right ventricular  size is normal. There is moderately elevated pulmonary artery systolic  pressure.  3. Left atrial size was severely dilated.  4. Right atrial size was severely dilated.  5. The mitral valve is normal in structure. Mild mitral valve  regurgitation. No evidence of mitral stenosis.  6. The aortic valve was not well visualized. Aortic valve regurgitation  is not visualized. Mild aortic valve stenosis. Aortic valve mean gradient  measures 10.7 mmHg. Aortic valve peak gradient measures 18.7 mmHg. Aortic  valve area, by VTI measures 1.50  cm.  7. PASP is 42 mmHg, mild to moderate pulmonary HTN.  8. The inferior vena cava is normal in size with greater than 50%  respiratory variability, suggesting right atrial pressure of 3 mmHg.   Assessment and Plan:  1.  Multifactorial dyspnea on exertion.  She has diastolic dysfunction with increased filling pressures, weight stable on current dose of Demadex.  Ischemic testing and assessment of LVEF reassuring last year.  She does not describe any angina symptoms.  Atrial fibrillation burden was about 20% by most recent device check.  No change to current dosing of Demadex with potassium supplement.  2.  Paroxysmal to persistent atrial fibrillation with CHA2DS2-VASc score of 5.  Continue Eliquis for stroke prophylaxis, also Toprol-XL and Tikosyn.  ECG reviewed.  3.  Medtronic ICD in place.  No shocks or syncope.  Continue to follow with Dr. Lovena Le.  4.  Soft left carotid bruit.  Carotid  Dopplers have been ordered by Dr. Willey Blade.  5.  CAD status post CABG as well as DES of the PLA.  No active angina at this time.  Continue Norvasc, Toprol-XL, losartan, and Lipitor.  Medication Adjustments/Labs and Tests Ordered: Current medicines are reviewed at length with the patient today.  Concerns regarding medicines are outlined above.   Tests Ordered: Orders Placed This Encounter  Procedures  . EKG 12-Lead    Medication Changes: No orders of the defined types were placed in this encounter.   Disposition:  Follow up 6 months.  Signed, Satira Sark, MD, [facility] 08/29/2020 1:37 PM    [facility] at [facility] [address] [place], [place] [address] Phone: [phone]; Fax: [phone]

## 2020-09-02 ENCOUNTER — Ambulatory Visit (HOSPITAL_COMMUNITY)
Admission: RE | Admit: 2020-09-02 | Discharge: 2020-09-02 | Disposition: A | Discharge status: Home / Self Care | Payer: Medicare PPO | Source: Ambulatory Visit | Attending: Internal Medicine | Admitting: Internal Medicine

## 2020-09-02 DIAGNOSIS — I6523 Occlusion and stenosis of bilateral carotid arteries: Secondary | ICD-10-CM | POA: Diagnosis not present

## 2020-09-02 DIAGNOSIS — R0989 Other specified symptoms and signs involving the circulatory and respiratory systems: Secondary | ICD-10-CM | POA: Diagnosis not present

## 2020-09-05 NOTE — Progress Notes (Signed)
Remote ICD transmission.   

## 2020-09-05 NOTE — Addendum Note (Signed)
Addended by: Douglass Rivers D on: 09/05/2020 01:05 PM   Modules accepted: Level of Service

## 2020-09-17 ENCOUNTER — Telehealth: Payer: Self-pay | Admitting: Emergency Medicine

## 2020-09-17 NOTE — Telephone Encounter (Signed)
Called patient due to increased AF burden of 31.1%. Patient on Eliquis and metoprolol. Previous 10.8% AT/AF burden. Patient not symptomatic today but does states that she has had episodes of fatigue and shortness of breath. Patient states compliance with all medications. I told patient that I would send this information to Dr. Lovena Le for review.

## 2020-09-26 DIAGNOSIS — L298 Other pruritus: Secondary | ICD-10-CM | POA: Diagnosis not present

## 2020-09-26 DIAGNOSIS — X32XXXD Exposure to sunlight, subsequent encounter: Secondary | ICD-10-CM | POA: Diagnosis not present

## 2020-09-26 DIAGNOSIS — L570 Actinic keratosis: Secondary | ICD-10-CM | POA: Diagnosis not present

## 2020-10-16 ENCOUNTER — Ambulatory Visit (INDEPENDENT_AMBULATORY_CARE_PROVIDER_SITE_OTHER): Payer: Medicare PPO

## 2020-10-16 DIAGNOSIS — I4819 Other persistent atrial fibrillation: Secondary | ICD-10-CM

## 2020-10-16 LAB — CUP PACEART REMOTE DEVICE CHECK
Battery Remaining Longevity: 7 mo
Battery Voltage: 2.83 V
Brady Statistic AP VP Percent: 63.95 %
Brady Statistic AP VS Percent: 0.01 %
Brady Statistic AS VP Percent: 36.02 %
Brady Statistic AS VS Percent: 0.03 %
Brady Statistic RA Percent Paced: 60.53 %
Brady Statistic RV Percent Paced: 99.82 %
Date Time Interrogation Session: 20220629031806
HighPow Impedance: 61 Ohm
HighPow Impedance: 81 Ohm
Implantable Lead Implant Date: 20030823
Implantable Lead Implant Date: 20030823
Implantable Lead Location: 753859
Implantable Lead Location: 753860
Implantable Lead Model: 157
Implantable Lead Model: 4086
Implantable Lead Serial Number: 108215
Implantable Lead Serial Number: 112190
Implantable Pulse Generator Implant Date: 20150608
Lead Channel Impedance Value: 342 Ohm
Lead Channel Impedance Value: 342 Ohm
Lead Channel Impedance Value: 456 Ohm
Lead Channel Pacing Threshold Amplitude: 0.625 V
Lead Channel Pacing Threshold Amplitude: 1.25 V
Lead Channel Pacing Threshold Pulse Width: 0.4 ms
Lead Channel Pacing Threshold Pulse Width: 0.4 ms
Lead Channel Sensing Intrinsic Amplitude: 11.625 mV
Lead Channel Sensing Intrinsic Amplitude: 11.625 mV
Lead Channel Sensing Intrinsic Amplitude: 2.375 mV
Lead Channel Sensing Intrinsic Amplitude: 2.375 mV
Lead Channel Setting Pacing Amplitude: 2 V
Lead Channel Setting Pacing Amplitude: 3 V
Lead Channel Setting Pacing Pulse Width: 0.4 ms
Lead Channel Setting Sensing Sensitivity: 0.3 mV

## 2020-10-31 ENCOUNTER — Telehealth: Payer: Self-pay

## 2020-10-31 NOTE — Telephone Encounter (Signed)
Return phone call. Appears AT/AF burden has improved to now 1.6% as of 10/16/20. Patient has another transmission on 11/18/20. Patient advised to call if any questions or concerns arise. Appreciative of follow up call.

## 2020-10-31 NOTE — Telephone Encounter (Signed)
I told the patient the nurse will give her a call back.

## 2020-11-05 NOTE — Progress Notes (Signed)
Remote ICD transmission.   

## 2020-11-18 ENCOUNTER — Ambulatory Visit (INDEPENDENT_AMBULATORY_CARE_PROVIDER_SITE_OTHER): Payer: Medicare PPO

## 2020-11-18 DIAGNOSIS — I4819 Other persistent atrial fibrillation: Secondary | ICD-10-CM

## 2020-11-19 DIAGNOSIS — N1832 Chronic kidney disease, stage 3b: Secondary | ICD-10-CM | POA: Diagnosis not present

## 2020-11-19 DIAGNOSIS — I10 Essential (primary) hypertension: Secondary | ICD-10-CM | POA: Diagnosis not present

## 2020-11-19 DIAGNOSIS — R7303 Prediabetes: Secondary | ICD-10-CM | POA: Diagnosis not present

## 2020-11-19 DIAGNOSIS — I5032 Chronic diastolic (congestive) heart failure: Secondary | ICD-10-CM | POA: Diagnosis not present

## 2020-11-19 DIAGNOSIS — Z79899 Other long term (current) drug therapy: Secondary | ICD-10-CM | POA: Diagnosis not present

## 2020-11-19 LAB — CUP PACEART REMOTE DEVICE CHECK
Battery Remaining Longevity: 6 mo
Battery Voltage: 2.83 V
Brady Statistic AP VP Percent: 87.09 %
Brady Statistic AP VS Percent: 0 %
Brady Statistic AS VP Percent: 12.9 %
Brady Statistic AS VS Percent: 0.01 %
Brady Statistic RA Percent Paced: 85.35 %
Brady Statistic RV Percent Paced: 99.87 %
Date Time Interrogation Session: 20220801042407
HighPow Impedance: 59 Ohm
HighPow Impedance: 83 Ohm
Implantable Lead Implant Date: 20030823
Implantable Lead Implant Date: 20030823
Implantable Lead Location: 753859
Implantable Lead Location: 753860
Implantable Lead Model: 157
Implantable Lead Model: 4086
Implantable Lead Serial Number: 108215
Implantable Lead Serial Number: 112190
Implantable Pulse Generator Implant Date: 20150608
Lead Channel Impedance Value: 342 Ohm
Lead Channel Impedance Value: 342 Ohm
Lead Channel Impedance Value: 456 Ohm
Lead Channel Pacing Threshold Amplitude: 0.625 V
Lead Channel Pacing Threshold Amplitude: 1.375 V
Lead Channel Pacing Threshold Pulse Width: 0.4 ms
Lead Channel Pacing Threshold Pulse Width: 0.4 ms
Lead Channel Sensing Intrinsic Amplitude: 0.75 mV
Lead Channel Sensing Intrinsic Amplitude: 0.75 mV
Lead Channel Sensing Intrinsic Amplitude: 11.625 mV
Lead Channel Sensing Intrinsic Amplitude: 11.625 mV
Lead Channel Setting Pacing Amplitude: 2 V
Lead Channel Setting Pacing Amplitude: 2.75 V
Lead Channel Setting Pacing Pulse Width: 0.4 ms
Lead Channel Setting Sensing Sensitivity: 0.3 mV

## 2020-11-26 ENCOUNTER — Other Ambulatory Visit (HOSPITAL_COMMUNITY): Payer: Self-pay | Admitting: Internal Medicine

## 2020-11-26 ENCOUNTER — Ambulatory Visit (HOSPITAL_COMMUNITY)
Admission: RE | Admit: 2020-11-26 | Discharge: 2020-11-26 | Disposition: A | Discharge status: Home / Self Care | Payer: Medicare PPO | Source: Ambulatory Visit | Attending: Internal Medicine | Admitting: Internal Medicine

## 2020-11-26 ENCOUNTER — Other Ambulatory Visit: Payer: Self-pay

## 2020-11-26 DIAGNOSIS — I700 Atherosclerosis of aorta: Secondary | ICD-10-CM | POA: Diagnosis not present

## 2020-11-26 DIAGNOSIS — I480 Paroxysmal atrial fibrillation: Secondary | ICD-10-CM | POA: Diagnosis not present

## 2020-11-26 DIAGNOSIS — Z9889 Other specified postprocedural states: Secondary | ICD-10-CM | POA: Diagnosis not present

## 2020-11-26 DIAGNOSIS — R0602 Shortness of breath: Secondary | ICD-10-CM | POA: Diagnosis not present

## 2020-11-26 DIAGNOSIS — N1832 Chronic kidney disease, stage 3b: Secondary | ICD-10-CM | POA: Diagnosis not present

## 2020-11-26 DIAGNOSIS — I5032 Chronic diastolic (congestive) heart failure: Secondary | ICD-10-CM | POA: Diagnosis not present

## 2020-11-26 DIAGNOSIS — I517 Cardiomegaly: Secondary | ICD-10-CM | POA: Diagnosis not present

## 2020-11-30 ENCOUNTER — Other Ambulatory Visit: Payer: Self-pay | Admitting: Cardiology

## 2020-12-11 NOTE — Progress Notes (Signed)
Remote pacemaker transmission.   

## 2020-12-11 NOTE — Addendum Note (Signed)
Addended by: Douglass Rivers D on: 12/11/2020 06:32 PM   Modules accepted: Level of Service

## 2020-12-19 ENCOUNTER — Ambulatory Visit (INDEPENDENT_AMBULATORY_CARE_PROVIDER_SITE_OTHER): Payer: Medicare PPO

## 2020-12-19 DIAGNOSIS — I509 Heart failure, unspecified: Secondary | ICD-10-CM | POA: Diagnosis not present

## 2020-12-24 LAB — CUP PACEART REMOTE DEVICE CHECK
Battery Remaining Longevity: 6 mo
Battery Voltage: 2.82 V
Brady Statistic AP VP Percent: 63.99 %
Brady Statistic AP VS Percent: 0 %
Brady Statistic AS VP Percent: 35.95 %
Brady Statistic AS VS Percent: 0.06 %
Brady Statistic RA Percent Paced: 60.12 %
Brady Statistic RV Percent Paced: 99.76 %
Date Time Interrogation Session: 20220901042206
HighPow Impedance: 60 Ohm
HighPow Impedance: 79 Ohm
Implantable Lead Implant Date: 20030823
Implantable Lead Implant Date: 20030823
Implantable Lead Location: 753859
Implantable Lead Location: 753860
Implantable Lead Model: 157
Implantable Lead Model: 4086
Implantable Lead Serial Number: 108215
Implantable Lead Serial Number: 112190
Implantable Pulse Generator Implant Date: 20150608
Lead Channel Impedance Value: 361 Ohm
Lead Channel Impedance Value: 361 Ohm
Lead Channel Impedance Value: 456 Ohm
Lead Channel Pacing Threshold Amplitude: 0.625 V
Lead Channel Pacing Threshold Amplitude: 1.375 V
Lead Channel Pacing Threshold Pulse Width: 0.4 ms
Lead Channel Pacing Threshold Pulse Width: 0.4 ms
Lead Channel Sensing Intrinsic Amplitude: 11.625 mV
Lead Channel Sensing Intrinsic Amplitude: 11.625 mV
Lead Channel Sensing Intrinsic Amplitude: 3.5 mV
Lead Channel Sensing Intrinsic Amplitude: 3.5 mV
Lead Channel Setting Pacing Amplitude: 2 V
Lead Channel Setting Pacing Amplitude: 2.75 V
Lead Channel Setting Pacing Pulse Width: 0.4 ms
Lead Channel Setting Sensing Sensitivity: 0.3 mV

## 2020-12-31 NOTE — Progress Notes (Signed)
Remote pacemaker transmission.   

## 2021-01-20 ENCOUNTER — Ambulatory Visit (INDEPENDENT_AMBULATORY_CARE_PROVIDER_SITE_OTHER): Payer: Medicare PPO

## 2021-01-20 DIAGNOSIS — I4819 Other persistent atrial fibrillation: Secondary | ICD-10-CM

## 2021-01-21 LAB — CUP PACEART REMOTE DEVICE CHECK
Battery Remaining Longevity: 4 mo
Battery Voltage: 2.8 V
Brady Statistic AP VP Percent: 60.84 %
Brady Statistic AP VS Percent: 0.01 %
Brady Statistic AS VP Percent: 39.13 %
Brady Statistic AS VS Percent: 0.03 %
Brady Statistic RA Percent Paced: 56.75 %
Brady Statistic RV Percent Paced: 99.88 %
Date Time Interrogation Session: 20221003042306
HighPow Impedance: 60 Ohm
HighPow Impedance: 80 Ohm
Implantable Lead Implant Date: 20030823
Implantable Lead Implant Date: 20030823
Implantable Lead Location: 753859
Implantable Lead Location: 753860
Implantable Lead Model: 157
Implantable Lead Model: 4086
Implantable Lead Serial Number: 108215
Implantable Lead Serial Number: 112190
Implantable Pulse Generator Implant Date: 20150608
Lead Channel Impedance Value: 342 Ohm
Lead Channel Impedance Value: 361 Ohm
Lead Channel Impedance Value: 418 Ohm
Lead Channel Pacing Threshold Amplitude: 0.625 V
Lead Channel Pacing Threshold Amplitude: 1.25 V
Lead Channel Pacing Threshold Pulse Width: 0.4 ms
Lead Channel Pacing Threshold Pulse Width: 0.4 ms
Lead Channel Sensing Intrinsic Amplitude: 12.375 mV
Lead Channel Sensing Intrinsic Amplitude: 12.375 mV
Lead Channel Sensing Intrinsic Amplitude: 2.625 mV
Lead Channel Sensing Intrinsic Amplitude: 2.625 mV
Lead Channel Setting Pacing Amplitude: 2 V
Lead Channel Setting Pacing Amplitude: 2.5 V
Lead Channel Setting Pacing Pulse Width: 0.4 ms
Lead Channel Setting Sensing Sensitivity: 0.3 mV

## 2021-01-27 ENCOUNTER — Other Ambulatory Visit (HOSPITAL_COMMUNITY): Payer: Self-pay | Admitting: Internal Medicine

## 2021-01-27 DIAGNOSIS — Z1231 Encounter for screening mammogram for malignant neoplasm of breast: Secondary | ICD-10-CM

## 2021-01-28 NOTE — Progress Notes (Signed)
Remote pacemaker transmission.   

## 2021-01-28 NOTE — Addendum Note (Signed)
Addended by: Douglass Rivers D on: 01/28/2021 02:12 PM   Modules accepted: Level of Service

## 2021-01-29 ENCOUNTER — Ambulatory Visit (HOSPITAL_COMMUNITY): Payer: Medicare PPO

## 2021-02-03 ENCOUNTER — Ambulatory Visit (HOSPITAL_COMMUNITY): Payer: Medicare PPO

## 2021-02-10 ENCOUNTER — Ambulatory Visit (HOSPITAL_COMMUNITY)
Admission: RE | Admit: 2021-02-10 | Discharge: 2021-02-10 | Disposition: A | Discharge status: Home / Self Care | Payer: Medicare PPO | Source: Ambulatory Visit | Attending: Internal Medicine | Admitting: Internal Medicine

## 2021-02-10 ENCOUNTER — Other Ambulatory Visit: Payer: Self-pay

## 2021-02-10 DIAGNOSIS — Z1231 Encounter for screening mammogram for malignant neoplasm of breast: Secondary | ICD-10-CM | POA: Insufficient documentation

## 2021-02-20 ENCOUNTER — Ambulatory Visit (INDEPENDENT_AMBULATORY_CARE_PROVIDER_SITE_OTHER): Payer: Medicare PPO

## 2021-02-20 DIAGNOSIS — I4819 Other persistent atrial fibrillation: Secondary | ICD-10-CM

## 2021-02-20 LAB — CUP PACEART REMOTE DEVICE CHECK
Battery Remaining Longevity: 4 mo
Battery Voltage: 2.8 V
Brady Statistic AP VP Percent: 63.72 %
Brady Statistic AP VS Percent: 0.01 %
Brady Statistic AS VP Percent: 36.26 %
Brady Statistic AS VS Percent: 0.02 %
Brady Statistic RA Percent Paced: 60.17 %
Brady Statistic RV Percent Paced: 99.89 %
Date Time Interrogation Session: 20221103033524
HighPow Impedance: 58 Ohm
HighPow Impedance: 80 Ohm
Implantable Lead Implant Date: 20030823
Implantable Lead Implant Date: 20030823
Implantable Lead Location: 753859
Implantable Lead Location: 753860
Implantable Lead Model: 157
Implantable Lead Model: 4086
Implantable Lead Serial Number: 108215
Implantable Lead Serial Number: 112190
Implantable Pulse Generator Implant Date: 20150608
Lead Channel Impedance Value: 361 Ohm
Lead Channel Impedance Value: 361 Ohm
Lead Channel Impedance Value: 418 Ohm
Lead Channel Pacing Threshold Amplitude: 0.625 V
Lead Channel Pacing Threshold Amplitude: 1.375 V
Lead Channel Pacing Threshold Pulse Width: 0.4 ms
Lead Channel Pacing Threshold Pulse Width: 0.4 ms
Lead Channel Sensing Intrinsic Amplitude: 12.375 mV
Lead Channel Sensing Intrinsic Amplitude: 12.375 mV
Lead Channel Sensing Intrinsic Amplitude: 2.125 mV
Lead Channel Sensing Intrinsic Amplitude: 2.125 mV
Lead Channel Setting Pacing Amplitude: 2 V
Lead Channel Setting Pacing Amplitude: 2.75 V
Lead Channel Setting Pacing Pulse Width: 0.4 ms
Lead Channel Setting Sensing Sensitivity: 0.3 mV

## 2021-02-26 NOTE — Progress Notes (Signed)
Remote ICD transmission.   

## 2021-03-03 DIAGNOSIS — I4811 Longstanding persistent atrial fibrillation: Secondary | ICD-10-CM | POA: Diagnosis not present

## 2021-03-03 DIAGNOSIS — I5032 Chronic diastolic (congestive) heart failure: Secondary | ICD-10-CM | POA: Diagnosis not present

## 2021-03-03 DIAGNOSIS — Z79899 Other long term (current) drug therapy: Secondary | ICD-10-CM | POA: Diagnosis not present

## 2021-03-03 DIAGNOSIS — N1832 Chronic kidney disease, stage 3b: Secondary | ICD-10-CM | POA: Diagnosis not present

## 2021-03-10 ENCOUNTER — Other Ambulatory Visit: Payer: Self-pay | Admitting: Cardiology

## 2021-03-10 DIAGNOSIS — Z6831 Body mass index (BMI) 31.0-31.9, adult: Secondary | ICD-10-CM | POA: Diagnosis not present

## 2021-03-10 DIAGNOSIS — I2510 Atherosclerotic heart disease of native coronary artery without angina pectoris: Secondary | ICD-10-CM | POA: Diagnosis not present

## 2021-03-10 DIAGNOSIS — I5032 Chronic diastolic (congestive) heart failure: Secondary | ICD-10-CM | POA: Diagnosis not present

## 2021-03-10 DIAGNOSIS — I480 Paroxysmal atrial fibrillation: Secondary | ICD-10-CM | POA: Diagnosis not present

## 2021-03-10 DIAGNOSIS — N1832 Chronic kidney disease, stage 3b: Secondary | ICD-10-CM | POA: Diagnosis not present

## 2021-03-10 NOTE — Telephone Encounter (Signed)
Prescription refill request for Eliquis received. Indication: afib  Last office visit: [place], 08/29/2020 Scr: 1.54, 08/20/2020 Age: 87 yo  Weight: 73.5 kg   Refill sent.

## 2021-03-11 ENCOUNTER — Encounter: Payer: Self-pay | Admitting: *Deleted

## 2021-03-11 ENCOUNTER — Encounter: Payer: Self-pay | Admitting: Internal Medicine

## 2021-03-11 ENCOUNTER — Ambulatory Visit (INDEPENDENT_AMBULATORY_CARE_PROVIDER_SITE_OTHER): Payer: Medicare PPO | Admitting: Internal Medicine

## 2021-03-11 ENCOUNTER — Other Ambulatory Visit: Payer: Self-pay

## 2021-03-11 VITALS — BP 116/56 | HR 86 | Ht 59.0 in | Wt 158.4 lb

## 2021-03-11 DIAGNOSIS — Z950 Presence of cardiac pacemaker: Secondary | ICD-10-CM | POA: Diagnosis not present

## 2021-03-11 DIAGNOSIS — I5030 Unspecified diastolic (congestive) heart failure: Secondary | ICD-10-CM | POA: Diagnosis not present

## 2021-03-11 DIAGNOSIS — I4729 Other ventricular tachycardia: Secondary | ICD-10-CM | POA: Diagnosis not present

## 2021-03-11 DIAGNOSIS — I4819 Other persistent atrial fibrillation: Secondary | ICD-10-CM

## 2021-03-11 DIAGNOSIS — I5032 Chronic diastolic (congestive) heart failure: Secondary | ICD-10-CM | POA: Diagnosis not present

## 2021-03-11 DIAGNOSIS — I472 Ventricular tachycardia, unspecified: Secondary | ICD-10-CM

## 2021-03-11 DIAGNOSIS — I442 Atrioventricular block, complete: Secondary | ICD-10-CM

## 2021-03-11 NOTE — Patient Instructions (Signed)
Medication Instructions:  Your physician recommends that you continue on your current medications as directed. Please refer to the Current Medication list given to you today.  *If you need a refill on your cardiac medications before your next appointment, please call your pharmacy*   Lab Work: NONE   If you have labs (blood work) drawn today and your tests are completely normal, you will receive your results only by: MyChart Message (if you have MyChart) OR A paper copy in the mail If you have any lab test that is abnormal or we need to change your treatment, we will call you to review the results.   Testing/Procedures: NONE    Follow-Up: At [facility], you and your health needs are our priority.  As part of our continuing mission to provide you with exceptional heart care, we have created designated Provider Care Teams.  These Care Teams include your primary Cardiologist (physician) and Advanced Practice Providers (APPs -  Physician Assistants and Nurse Practitioners) who all work together to provide you with the care you need, when you need it.  We recommend signing up for the patient portal called "MyChart".  Sign up information is provided on this After Visit Summary.  MyChart is used to connect with patients for Virtual Visits (Telemedicine).  Patients are able to view lab/test results, encounter notes, upcoming appointments, etc.  Non-urgent messages can be sent to your provider as well.   To learn more about what you can do with MyChart, go to [email or web address].    Your next appointment:   6 month(s)  The format for your next appointment:   In Person  Provider:   Gregg Taylor, MD   Other Instructions Thank you for choosing [facility]!    

## 2021-03-11 NOTE — Progress Notes (Signed)
HPI [name] [name] returns today for followup. She is a pleasant 87 yo woman with a h/o chronic systolic heart failure, persistent atrial fib, CHB, s/p PPM, VT, s/p ICD and asthma. She has done better in the past several months. She does not feel palpitations but her symptoms are improved. No ICD shock. She has mild peripheral edema. She admits to sodium indiscretion but has improved recently and she is approaching ERI on her ICD. Allergies  Allergen Reactions   Codeine Nausea And Vomiting   Keflex [Cephalexin] Itching and Rash     Current Outpatient Medications  Medication Sig Dispense Refill   acetaminophen (TYLENOL) 500 MG tablet Take 500 mg by mouth every morning.     albuterol (PROVENTIL HFA;VENTOLIN HFA) 108 (90 BASE) MCG/ACT inhaler Inhale 2 puffs into the lungs every 6 (six) hours as needed for wheezing or shortness of breath. 1 Inhaler 2   amLODipine (NORVASC) 5 MG tablet TAKE (1) TABLET BY MOUTH ONCE DAILY. 30 tablet 6   apixaban (ELIQUIS) 2.5 MG TABS tablet TAKE (1) TABLET BY MOUTH TWICE DAILY. 60 tablet 5   atorvastatin (LIPITOR) 20 MG tablet TAKE ONE TABLET BY MOUTH ONCE DAILY. 90 tablet 2   Calcium Carbonate-Vitamin D (CALCIUM 600 + D PO) Take 1 tablet by mouth 2 (two) times daily.     Cholecalciferol (VITAMIN D) 2000 UNITS CAPS Take 1 capsule by mouth at bedtime.     dofetilide (TIKOSYN) 125 MCG capsule TAKE (1) CAPSULE BY MOUTH TWICE DAILY. 694 capsule 3   folic acid (FOLVITE) 1 MG tablet Take 1 mg by mouth 2 (two) times daily.     losartan (COZAAR) 100 MG tablet TAKE ONE TABLET BY MOUTH DAILY. 90 tablet 2   metoprolol succinate (TOPROL-XL) 100 MG 24 hr tablet TAKE ONE TABLET BY MOUTH EVERY MORNING. TAKE WITH OR IMMEDIATELY FOLLOWING A MEAL. 90 tablet 2   Polyethyl Glycol-Propyl Glycol (SYSTANE OP) Place 1 drop into both eyes daily as needed (Dry eyes).     potassium chloride SA (KLOR-CON) 20 MEQ tablet TAKE (1) TABLET BY MOUTH 3 TIMES DAILY. 90 tablet 11   torsemide  (DEMADEX) 20 MG tablet TAKE 3 TABLETS BY MOUTH DAILY. 270 tablet 3   No current facility-administered medications for this visit.     Past Medical History:  Diagnosis Date   Anxiety    Chronic atrial fibrillation (H[facility])    Chronic back pain    Chronic diastolic heart failure ([facility])    Chronic obstructive pulmonary disease, unspecified ([facility])    Complete heart block ([facility])    COPD (chronic obstructive pulmonary disease) ([facility])    Coronary atherosclerosis of native coronary artery    Multivessel status post CABG, DES PLA March 2006   DDD (degenerative disc disease), lumbar    Essential hypertension    Headache    ICD (implantable cardioverter-defibrillator) in place    Mixed hyperlipidemia    Osteoarthritis    Ventricular fibrillation ([place]) 2003   a. seen on PPM interrogation a/w syncope    ROS:   All systems reviewed and negative except as noted in the HPI.   Past Surgical History:  Procedure Laterality Date   CARDIAC DEFIBRILLATOR PLACEMENT     MDT dual chamber ICD   CARDIOVERSION N/A 08/09/2014   Procedure: CARDIOVERSION;  Surgeon: Sanda Klein, MD;  Location: [facility];  Service: Cardiovascular;  Laterality: N/A;   CORONARY ARTERY BYPASS GRAFT     LIMA to LAD, SVG to diagonal, SVG  to ramus and OM   IMPLANTABLE CARDIOVERTER DEFIBRILLATOR GENERATOR CHANGE N/A 09/25/2013   Procedure: IMPLANTABLE CARDIOVERTER DEFIBRILLATOR GENERATOR CHANGE;  Surgeon: Evans Lance, MD;  Location: [facility] CATH LAB;  Service: Cardiovascular;  Laterality: N/A;   LEAD REVISION N/A 09/29/2013   Procedure: LEAD REVISION;  Surgeon: Deboraha Sprang, MD;  Location: [facility] CATH LAB;  Service: Cardiovascular;  Laterality: N/A;   TONSILLECTOMY     YAG LASER APPLICATION Left 10/25/9394   Procedure: YAG LASER APPLICATION;  Surgeon: Elta Guadeloupe T. Gershon Crane, MD;  Location: [facility];  Service: Ophthalmology;  Laterality: Left;   YAG LASER APPLICATION Right 8/86/4847   Procedure: YAG LASER APPLICATION;  Surgeon: Elta Guadeloupe T. Gershon Crane,  MD;  Location: [facility];  Service: Ophthalmology;  Laterality: Right;     Family History  Problem Relation Age of Onset   Heart attack Father    Heart attack Brother      Social History   Socioeconomic History   Marital status: Widowed    Spouse name: Not on file   Number of children: 2   Years of education: Not on file   Highest education level: Not on file  Occupational History   Occupation: Retired    Comment: Surveyor, quantity: RETIRED  Tobacco Use   Smoking status: Never   Smokeless tobacco: Never  Vaping Use   Vaping Use: Never used  Substance and Sexual Activity   Alcohol use: No    Alcohol/week: 0.0 standard drinks   Drug use: No   Sexual activity: Never  Other Topics Concern   Not on file  Social History Narrative   Not on file   Social Determinants of Health   Financial Resource Strain: Not on file  Food Insecurity: Not on file  Transportation Needs: Not on file  Physical Activity: Not on file  Stress: Not on file  Social Connections: Not on file  Intimate Partner Violence: Not on file     BP (!) 116/56   Pulse 86   Ht 4\' 11"  (1.499 m)   Wt 158 lb 6.4 oz (71.8 kg)   SpO2 92%   BMI 31.99 kg/m   Physical Exam:  Well appearing NAD HEENT: Unremarkable Neck:  No JVD, no thyromegally Lymphatics:  No adenopathy Back:  No CVA tenderness Lungs:  Clear with no wheezes HEART:  Regular rate rhythm, no murmurs, no rubs, no clicks Abd:  soft, positive bowel sounds, no organomegally, no rebound, no guarding Ext:  2 plus pulses, no edema, no cyanosis, no clubbing Skin:  No rashes no nodules Neuro:  CN II through XII intact, motor grossly intact  EKG - reviewed.  Nsr with ventricular pacing  DEVICE  Normal device function.  See PaceArt for details. Approaching ERI.  Assess/Plan:  1. VT - she has only had NSVT since her last visit. 2. Persistent atrial fib - her burden is much improved. She will continue dofetilide 3. CHB - she is  asymptomatic, s/p PPM 4. HFPEF - she has class 2 symptoms. We will follow   Salome Spotted

## 2021-03-12 LAB — CUP PACEART INCLINIC DEVICE CHECK
Battery Remaining Longevity: 4 mo
Battery Voltage: 2.78 V
Brady Statistic AP VP Percent: 58.19 %
Brady Statistic AP VS Percent: 0.01 %
Brady Statistic AS VP Percent: 41.74 %
Brady Statistic AS VS Percent: 0.06 %
Brady Statistic RA Percent Paced: 54.59 %
Brady Statistic RV Percent Paced: 99.76 %
Date Time Interrogation Session: 20221122160927
HighPow Impedance: 57 Ohm
HighPow Impedance: 77 Ohm
Implantable Lead Implant Date: 20030823
Implantable Lead Implant Date: 20030823
Implantable Lead Location: 753859
Implantable Lead Location: 753860
Implantable Lead Model: 157
Implantable Lead Model: 4086
Implantable Lead Serial Number: 108215
Implantable Lead Serial Number: 112190
Implantable Pulse Generator Implant Date: 20150608
Lead Channel Impedance Value: 342 Ohm
Lead Channel Impedance Value: 342 Ohm
Lead Channel Impedance Value: 418 Ohm
Lead Channel Pacing Threshold Amplitude: 0.625 V
Lead Channel Pacing Threshold Amplitude: 1.375 V
Lead Channel Pacing Threshold Pulse Width: 0.4 ms
Lead Channel Pacing Threshold Pulse Width: 0.4 ms
Lead Channel Sensing Intrinsic Amplitude: 1.25 mV
Lead Channel Sensing Intrinsic Amplitude: 12.375 mV
Lead Channel Sensing Intrinsic Amplitude: 12.375 mV
Lead Channel Sensing Intrinsic Amplitude: 3.625 mV
Lead Channel Setting Pacing Amplitude: 2 V
Lead Channel Setting Pacing Amplitude: 2.75 V
Lead Channel Setting Pacing Pulse Width: 0.4 ms
Lead Channel Setting Sensing Sensitivity: 0.3 mV

## 2021-03-24 ENCOUNTER — Other Ambulatory Visit: Payer: Self-pay

## 2021-03-24 ENCOUNTER — Ambulatory Visit: Payer: Medicare PPO | Admitting: Orthopedic Surgery

## 2021-03-24 ENCOUNTER — Encounter: Payer: Self-pay | Admitting: Orthopedic Surgery

## 2021-03-24 ENCOUNTER — Ambulatory Visit: Payer: Medicare PPO

## 2021-03-24 ENCOUNTER — Ambulatory Visit (INDEPENDENT_AMBULATORY_CARE_PROVIDER_SITE_OTHER): Payer: Medicare PPO

## 2021-03-24 VITALS — BP 163/73 | HR 78 | Ht 59.0 in | Wt 158.0 lb

## 2021-03-24 DIAGNOSIS — I4729 Other ventricular tachycardia: Secondary | ICD-10-CM

## 2021-03-24 DIAGNOSIS — M545 Low back pain, unspecified: Secondary | ICD-10-CM | POA: Diagnosis not present

## 2021-03-24 NOTE — Progress Notes (Signed)
Chief Complaint  Patient presents with   Back Pain    For at least 3 years,no improvement     87 year old female with worsening back pain some leg pain pain with standing relieved by flexing the spine comes in for evaluation previous x-ray showed degenerative disc disease  Hands and feet numb at times suggesting neuropathy  Did not distinguish or confirm real leg pain from the back  I repeated her xrays: Her x-rays show degenerative disc disease scoliosis not much different from when I saw him back in 2019  She has a defibrillator, she is on a blood thinner Eliquis.  Her lower extremities showed no weakness no reflex changes she did have lower back tenderness  She has let us know that when she bends forward the pain is relieved indicating she probably has spinal stenosis  She is not a surgical candidate  Recommend CT with the expected finding with spinal stenosis and then injections once we can get her off Eliquis  Follow-up after CT scan

## 2021-03-24 NOTE — Patient Instructions (Signed)
While we are working on your approval for CT scan please go ahead and call to schedule your appointment with [facility] within at least one (1) week.   Central Scheduling [phone]

## 2021-03-25 LAB — CUP PACEART REMOTE DEVICE CHECK
Battery Remaining Longevity: 3 mo
Battery Voltage: 2.76 V
Brady Statistic AP VP Percent: 90.66 %
Brady Statistic AP VS Percent: 0.01 %
Brady Statistic AS VP Percent: 9.32 %
Brady Statistic AS VS Percent: 0.01 %
Brady Statistic RA Percent Paced: 89.63 %
Brady Statistic RV Percent Paced: 99.84 %
Date Time Interrogation Session: 20221206122302
HighPow Impedance: 53 Ohm
HighPow Impedance: 71 Ohm
Implantable Lead Implant Date: 20030823
Implantable Lead Implant Date: 20030823
Implantable Lead Location: 753859
Implantable Lead Location: 753860
Implantable Lead Model: 157
Implantable Lead Model: 4086
Implantable Lead Serial Number: 108215
Implantable Lead Serial Number: 112190
Implantable Pulse Generator Implant Date: 20150608
Lead Channel Impedance Value: 342 Ohm
Lead Channel Impedance Value: 342 Ohm
Lead Channel Impedance Value: 399 Ohm
Lead Channel Pacing Threshold Amplitude: 0.625 V
Lead Channel Pacing Threshold Amplitude: 1.375 V
Lead Channel Pacing Threshold Pulse Width: 0.4 ms
Lead Channel Pacing Threshold Pulse Width: 0.4 ms
Lead Channel Sensing Intrinsic Amplitude: 0.375 mV
Lead Channel Sensing Intrinsic Amplitude: 0.375 mV
Lead Channel Sensing Intrinsic Amplitude: 12.375 mV
Lead Channel Sensing Intrinsic Amplitude: 12.375 mV
Lead Channel Setting Pacing Amplitude: 2 V
Lead Channel Setting Pacing Amplitude: 2.75 V
Lead Channel Setting Pacing Pulse Width: 0.4 ms
Lead Channel Setting Sensing Sensitivity: 0.3 mV

## 2021-03-27 DIAGNOSIS — L820 Inflamed seborrheic keratosis: Secondary | ICD-10-CM | POA: Diagnosis not present

## 2021-03-27 DIAGNOSIS — L570 Actinic keratosis: Secondary | ICD-10-CM | POA: Diagnosis not present

## 2021-03-27 DIAGNOSIS — X32XXXD Exposure to sunlight, subsequent encounter: Secondary | ICD-10-CM | POA: Diagnosis not present

## 2021-03-27 NOTE — Progress Notes (Signed)
Cardiology Office Note    Date:  03/28/2021   ID:  [name] [name], DOB Dec 14, 1931, MRN [id number]  PCP:  Asencion Noble, MD  Cardiologist: Rozann Lesches, MD   EP: Dr. Lovena Le  Chief Complaint  Patient presents with   Follow-up    6 month visit    History of Present Illness:    [name] [name] is a 87 y.o. female with past medical history of CAD (s/p CABG in 1998 with LIMA-LAD, SVG-Diagonal, SVG-RI-OM, DES to PLA in 2006, low risk NST in 09/2019), HFpEF, paroxysmal atrial fibrillation, history of VT (s/p ICD placement), HTN and HLD who presents to the office today for 64-month follow-up.  She was last examined by Dr. Domenic Polite in 08/2020 and reported chronic dyspnea on exertion but denied any chest pain or palpitations. She was continued on her current cardiac medications at that time. She did see Dr. Lovena Le most recently on 03/11/2021 and had mild edema at that time and was encouraged to limit her sodium intake. Device interrogation showed that her AF burden had improved to 0.2%.  In talking with the patient today, she reports having baseline dyspnea on exertion for the past several years which has overall been unchanged. She did undergo prior PFT's with Dr. Luan Pulling which showed COPD and she does have an Albuterol inhaler but does not use this. Denies any chest pain or palpitations. No recent orthopnea, PND or pitting edema. Her weight has overall been stable on her home scales.   Past Medical History:  Diagnosis Date   Anxiety    Chronic atrial fibrillation (HCC)    Chronic back pain    Chronic diastolic heart failure (HCC)    Chronic obstructive pulmonary disease, unspecified (HCC)    Complete heart block (HCC)    COPD (chronic obstructive pulmonary disease) (HCC)    Coronary atherosclerosis of native coronary artery    Multivessel status post CABG, DES PLA March 2006   DDD (degenerative disc disease), lumbar    Essential hypertension    Headache    ICD (implantable  cardioverter-defibrillator) in place    Mixed hyperlipidemia    Osteoarthritis    Ventricular fibrillation ([place]) 2003   a. seen on PPM interrogation a/w syncope    Past Surgical History:  Procedure Laterality Date   CARDIAC DEFIBRILLATOR PLACEMENT     MDT dual chamber ICD   CARDIOVERSION N/A 08/09/2014   Procedure: CARDIOVERSION;  Surgeon: Sanda Klein, MD;  Location: [facility];  Service: Cardiovascular;  Laterality: N/A;   CORONARY ARTERY BYPASS GRAFT     LIMA to LAD, SVG to diagonal, SVG to ramus and OM   IMPLANTABLE CARDIOVERTER DEFIBRILLATOR GENERATOR CHANGE N/A 09/25/2013   Procedure: IMPLANTABLE CARDIOVERTER DEFIBRILLATOR GENERATOR CHANGE;  Surgeon: Evans Lance, MD;  Location: [facility] CATH LAB;  Service: Cardiovascular;  Laterality: N/A;   LEAD REVISION N/A 09/29/2013   Procedure: LEAD REVISION;  Surgeon: Deboraha Sprang, MD;  Location: [facility] CATH LAB;  Service: Cardiovascular;  Laterality: N/A;   TONSILLECTOMY     YAG LASER APPLICATION Left 06/20/6710   Procedure: YAG LASER APPLICATION;  Surgeon: Elta Guadeloupe T. Gershon Crane, MD;  Location: [facility] ORS;  Service: Ophthalmology;  Laterality: Left;   YAG LASER APPLICATION Right 4/58/0998   Procedure: YAG LASER APPLICATION;  Surgeon: Elta Guadeloupe T. Gershon Crane, MD;  Location: [facility];  Service: Ophthalmology;  Laterality: Right;    Current Medications: Outpatient Medications Prior to Visit  Medication Sig Dispense Refill   acetaminophen (TYLENOL) 500 MG tablet Take 500  mg by mouth every morning.     albuterol (PROVENTIL HFA;VENTOLIN HFA) 108 (90 BASE) MCG/ACT inhaler Inhale 2 puffs into the lungs every 6 (six) hours as needed for wheezing or shortness of breath. 1 Inhaler 2   amLODipine (NORVASC) 5 MG tablet TAKE (1) TABLET BY MOUTH ONCE DAILY. 30 tablet 6   apixaban (ELIQUIS) 2.5 MG TABS tablet TAKE (1) TABLET BY MOUTH TWICE DAILY. 60 tablet 5   atorvastatin (LIPITOR) 20 MG tablet TAKE ONE TABLET BY MOUTH ONCE DAILY. 90 tablet 2   Calcium Carbonate-Vitamin D  (CALCIUM 600 + D PO) Take 1 tablet by mouth 2 (two) times daily.     Cholecalciferol (VITAMIN D) 2000 UNITS CAPS Take 1 capsule by mouth at bedtime.     dofetilide (TIKOSYN) 125 MCG capsule TAKE (1) CAPSULE BY MOUTH TWICE DAILY. 841 capsule 3   folic acid (FOLVITE) 1 MG tablet Take 1 mg by mouth 2 (two) times daily.     losartan (COZAAR) 100 MG tablet TAKE ONE TABLET BY MOUTH DAILY. 90 tablet 2   metoprolol succinate (TOPROL-XL) 100 MG 24 hr tablet TAKE ONE TABLET BY MOUTH EVERY MORNING. TAKE WITH OR IMMEDIATELY FOLLOWING A MEAL. 90 tablet 2   Polyethyl Glycol-Propyl Glycol (SYSTANE OP) Place 1 drop into both eyes daily as needed (Dry eyes).     potassium chloride SA (KLOR-CON) 20 MEQ tablet TAKE (1) TABLET BY MOUTH 3 TIMES DAILY. 90 tablet 11   torsemide (DEMADEX) 20 MG tablet TAKE 3 TABLETS BY MOUTH DAILY. 270 tablet 3   No facility-administered medications prior to visit.     Allergies:   Codeine and Keflex [cephalexin]   Social History   Socioeconomic History   Marital status: Widowed    Spouse name: Not on file   Number of children: 2   Years of education: Not on file   Highest education level: Not on file  Occupational History   Occupation: Retired    Comment: Surveyor, quantity: RETIRED  Tobacco Use   Smoking status: Never   Smokeless tobacco: Never  Vaping Use   Vaping Use: Never used  Substance and Sexual Activity   Alcohol use: No    Alcohol/week: 0.0 standard drinks   Drug use: No   Sexual activity: Never  Other Topics Concern   Not on file  Social History Narrative   Not on file   Social Determinants of Health   Financial Resource Strain: Not on file  Food Insecurity: Not on file  Transportation Needs: Not on file  Physical Activity: Not on file  Stress: Not on file  Social Connections: Not on file     Family History:  The patient's family history includes Heart attack in her brother and father.   Review of Systems:    Please see the  history of present illness.     All other systems reviewed and are otherwise negative except as noted above.   Physical Exam:    VS:  BP 134/72   Pulse 62   Wt 160 lb (72.6 kg)   BMI 32.32 kg/m    General: Pleasant elderly female appearing in no acute distress. Head: Normocephalic, atraumatic. Neck: No carotid bruits. JVD not elevated.  Lungs: Respirations regular and unlabored, without wheezes or rales.  Heart: Regular rate and rhythm. No S3 or S 4.  No murmur, no rubs, or gallops appreciated. Abdomen: Appears non-distended. No obvious abdominal masses. Msk:  Strength and tone appear normal for age.  No obvious joint deformities or effusions. Extremities: No clubbing or cyanosis. Trace ankle edema bilaterally.  Distal pedal pulses are 2+ bilaterally. Neuro: Alert and oriented X 3. Moves all extremitiesspontaneously. No focal deficits noted. Psych:  Responds to questions appropriately with a normal affect. Skin: No rashes or lesions noted  Wt Readings from Last 3 Encounters:  03/28/21 160 lb (72.6 kg)  03/24/21 158 lb (71.7 kg)  03/11/21 158 lb 6.4 oz (71.8 kg)      Studies/Labs Reviewed:   EKG:  EKG is not ordered today.    Recent Labs: No results found for requested labs within last 8760 hours.   Lipid Panel    Component Value Date/Time   CHOL 111 01/24/2013 0946   TRIG 119 01/24/2013 0946   HDL 31 (L) 01/24/2013 0946   CHOLHDL 3.6 01/24/2013 0946   VLDL 24 01/24/2013 0946   LDLCALC 56 01/24/2013 0946    Additional studies/ records that were reviewed today include:   NST: 09/2019 The study is normal. There are no perfusion defects This is a low risk study. The left ventricular ejection fraction is normal (55-65%). AV paced throughout the study   Echocardiogram: 09/2019 IMPRESSIONS     1. Left ventricular ejection fraction, by estimation, is 60 to 65%. The  left ventricle has normal function. The left ventricle has no regional  wall motion  abnormalities. Left ventricular diastolic parameters are  consistent with Grade II diastolic  dysfunction (pseudonormalization). Elevated left atrial pressure.   2. Right ventricular systolic function is normal. The right ventricular  size is normal. There is moderately elevated pulmonary artery systolic  pressure.   3. Left atrial size was severely dilated.   4. Right atrial size was severely dilated.   5. The mitral valve is normal in structure. Mild mitral valve  regurgitation. No evidence of mitral stenosis.   6. The aortic valve was not well visualized. Aortic valve regurgitation  is not visualized. Mild aortic valve stenosis. Aortic valve mean gradient  measures 10.7 mmHg. Aortic valve peak gradient measures 18.7 mmHg. Aortic  valve area, by VTI measures 1.50  cm.   7. PASP is 42 mmHg, mild to moderate pulmonary HTN.   8. The inferior vena cava is normal in size with greater than 50%  respiratory variability, suggesting right atrial pressure of 3 mmHg.    Assessment:    1. Coronary artery disease involving native coronary artery of native heart without angina pectoris   2. Chronic diastolic (congestive) heart failure (HCC)   3. Paroxysmal atrial fibrillation ([place])   4. S/P implantation of automatic cardioverter/defibrillator (AICD)   5. Essential hypertension   6. Hyperlipidemia LDL goal <70      Plan:   In order of problems listed above:  1. CAD - She is s/p CABG in 1998 with LIMA-LAD, SVG-Diagonal, SVG-RI-OM, DES to PLA in 2006. Her most recent ischemic evaluation was a low-risk NST in 09/2019. She reports having baseline dyspnea on exertion for the past few years and denies any acute changes in this. No recent chest pain. By review of her chart, she did have prior PFT's which suggested COPD. Discussed options in regards to this and she will plan to try using her Albuterol inhaler to see if she notices improvement in her symptoms. I encouraged her to make [place] or her PCP  aware if she wishes to see Pulmonology again going forward. Declines a referral at this time. - Continue Toprol-XL 100 mg daily and Atorvastatin 20 mg  daily. She is no longer on ASA given the need for anticoagulation.  2. HFpEF - She does experience intermittent lower extremity edema but denies any orthopnea or PND. Remains on Torsemide 60 mg daily. Recent labs showed that her creatinine was at 1.47 which is close to her baseline.  3. Paroxysmal Atrial Fibrillation - She denies any recent palpitations and is in normal sinus rhythm by examination today. Most recent device interrogation showed an AF burden of 0.2%. She remains on Toprol-XL 100 mg daily and Tikosyn 125 mcg twice daily. - She denies any evidence of active bleeding but does experience easy bruising. Remains on Eliquis 2.5 mg twice daily for anticoagulation. Her creatinine was at 1.47 by recent labs but was previously greater than 1.5. Would continue to follow with routine blood work and if her creatinine remains less than 1.5, would need to increase her dose to 5 mg twice daily. Will keep the same for now as her overall creatinine trend has been greater than 1.5.  4. History of VT  - She is s/p [employer] ICD placement which is followed by Dr. Lovena Le. Most recent device interrogation earlier this month showed her device was functioning normally.  5. HTN - Her blood pressure is well-controlled at 134/72 during today's visit. Continue current medication regimen with Amlodipine 5 mg daily, Losartan 100 mg daily and Toprol-XL 100 mg daily.  6. HLD - Her LDL was 60 in 02/2021. She remains on Atorvastatin 20 mg daily.    Medication Adjustments/Labs and Tests Ordered: Current medicines are reviewed at length with the patient today.  Concerns regarding medicines are outlined above.  Medication changes, Labs and Tests ordered today are listed in the Patient Instructions below. Patient Instructions  Medication Instructions:  Your physician  recommends that you continue on your current medications as directed. Please refer to the Current Medication list given to you today.   Labwork: None   Testing/Procedures: None   Follow-Up: 6 months  Any Other Special Instructions Will Be Listed Below (If Applicable).  If you need a refill on your cardiac medications before your next appointment, please call your pharmacy.   Signed, Erma Heritage, PA-C  03/28/2021 4:56 PM    [facility] [address] [place], [place] [address] Phone: [phone] Fax: [phone]

## 2021-03-28 ENCOUNTER — Encounter: Payer: Self-pay | Admitting: Student

## 2021-03-28 ENCOUNTER — Other Ambulatory Visit: Payer: Self-pay

## 2021-03-28 ENCOUNTER — Ambulatory Visit: Payer: Medicare PPO | Admitting: Student

## 2021-03-28 VITALS — BP 134/72 | HR 62 | Wt 160.0 lb

## 2021-03-28 DIAGNOSIS — E785 Hyperlipidemia, unspecified: Secondary | ICD-10-CM | POA: Diagnosis not present

## 2021-03-28 DIAGNOSIS — I5032 Chronic diastolic (congestive) heart failure: Secondary | ICD-10-CM

## 2021-03-28 DIAGNOSIS — I2510 Atherosclerotic heart disease of native coronary artery without angina pectoris: Secondary | ICD-10-CM

## 2021-03-28 DIAGNOSIS — Z95810 Presence of automatic (implantable) cardiac defibrillator: Secondary | ICD-10-CM

## 2021-03-28 DIAGNOSIS — I10 Essential (primary) hypertension: Secondary | ICD-10-CM | POA: Diagnosis not present

## 2021-03-28 DIAGNOSIS — I480 Paroxysmal atrial fibrillation: Secondary | ICD-10-CM

## 2021-03-28 NOTE — Patient Instructions (Signed)
Medication Instructions:  Your physician recommends that you continue on your current medications as directed. Please refer to the Current Medication list given to you today.   Labwork: None  Testing/Procedures: None  Follow-Up: 6 months  Any Other Special Instructions Will Be Listed Below (If Applicable).     If you need a refill on your cardiac medications before your next appointment, please call your pharmacy.   

## 2021-04-02 ENCOUNTER — Ambulatory Visit (HOSPITAL_COMMUNITY)
Admission: RE | Admit: 2021-04-02 | Discharge: 2021-04-02 | Disposition: A | Discharge status: Home / Self Care | Payer: Medicare PPO | Source: Ambulatory Visit | Attending: Orthopedic Surgery | Admitting: Orthopedic Surgery

## 2021-04-02 ENCOUNTER — Other Ambulatory Visit: Payer: Self-pay | Admitting: Cardiology

## 2021-04-02 ENCOUNTER — Other Ambulatory Visit: Payer: Self-pay

## 2021-04-02 DIAGNOSIS — M545 Low back pain, unspecified: Secondary | ICD-10-CM | POA: Diagnosis not present

## 2021-04-02 DIAGNOSIS — M5136 Other intervertebral disc degeneration, lumbar region: Secondary | ICD-10-CM | POA: Diagnosis not present

## 2021-04-02 NOTE — Addendum Note (Signed)
Addended by: Douglass Rivers D on: 04/02/2021 10:49 AM   Modules accepted: Level of Service

## 2021-04-02 NOTE — Progress Notes (Signed)
Remote pacemaker transmission.   

## 2021-04-08 ENCOUNTER — Other Ambulatory Visit: Payer: Self-pay | Admitting: Cardiology

## 2021-04-17 ENCOUNTER — Ambulatory Visit: Payer: Medicare PPO | Admitting: Orthopedic Surgery

## 2021-04-17 ENCOUNTER — Other Ambulatory Visit: Payer: Self-pay

## 2021-04-17 DIAGNOSIS — M48061 Spinal stenosis, lumbar region without neurogenic claudication: Secondary | ICD-10-CM | POA: Diagnosis not present

## 2021-04-17 DIAGNOSIS — M545 Low back pain, unspecified: Secondary | ICD-10-CM

## 2021-04-17 MED ORDER — NORTRIPTYLINE HCL 10 MG PO CAPS: 10.0000 mg | ORAL_CAPSULE | Freq: Every day | ORAL | 0 refills | Status: DC | PRN

## 2021-04-17 NOTE — Progress Notes (Signed)
Chief Complaint  Patient presents with   Back Pain    CT results Lumbar Feeling about the same    Encounter Diagnoses  Name Primary?   Lumbar pain Yes   Spinal stenosis of lumbar region, unspecified whether neurogenic claudication present    CT scan was reviewed, after personally reviewing the images, I interpret these images as  She has multilevel degenerative disc disease and spinal stenosis   87 year old female still having quite a bit of pain in her lumbar spine but she can control it by modifying her activities  She never went to physical therapy  She is on Eliquis  I would rather not deal with taking her off Eliquis for epidurals unless we have to and we discussed this.  She agrees  So I am going to recommend she continue with her Tylenol she can take some nortriptyline if needed for severe pain and she will go to therapy twice a week for 4 weeks  We can always opt for epidural injections if she continues to have symptoms    L1-L2: Circumferential disc protrusion to the left. Facet joint arthropathy. Narrowing of the spinal canal. Mild bilateral neural foraminal narrowing.   L2-L3: Circumferential disc protrusion with moderate narrowing of the spinal canal. Mild bilateral neural foraminal narrowing.   L3-L4: Circumferential disc protrusion with moderate spinal canal narrowing. Moderate narrowing of the right lateral recess.   L4-L5: Circumferential disc protrusion with spinal canal narrowing. Moderate facet joint arthropathy. Mild left neural foraminal narrowing.   L5-S1: No significant spinal canal or neural foraminal narrowing.   IMPRESSION: 1. Advanced multilevel degenerative disc disease with spinal canal and neural foraminal narrowing at multiple levels as described above.   2.  No acute fracture or subluxation. Meds ordered this encounter  Medications   nortriptyline (PAMELOR) 10 MG capsule    Sig: Take 1 capsule (10 mg total) by mouth daily as  needed for sleep.    Dispense:  30 capsule    Refill:  0

## 2021-04-18 ENCOUNTER — Telehealth: Payer: Self-pay | Admitting: Orthopedic Surgery

## 2021-04-18 NOTE — Telephone Encounter (Signed)
Called patient to notify and to schedule - someone picked up phone at home number [phone]; we were unable to hear each other; I called back and phone is ringing busy.

## 2021-04-18 NOTE — Telephone Encounter (Signed)
Reached patient; scheduled accordingly; aware of appointment.

## 2021-04-18 NOTE — Telephone Encounter (Signed)
-----   Message from Candice Camp, RT sent at 04/18/2021  8:19 AM EST ----- Regarding: RE: No follow up? 5 weeks / after she is done with therapy  ----- Message ----- From: Uvaldo Bristle Sent: 04/17/2021   4:36 PM EST To: Candice Camp, RT Subject: No follow up?                                  [name], [name] C [[id number]] to return for fol/up?  AVS not showing fol up

## 2021-04-24 ENCOUNTER — Ambulatory Visit (INDEPENDENT_AMBULATORY_CARE_PROVIDER_SITE_OTHER): Payer: Medicare PPO

## 2021-04-24 DIAGNOSIS — I5032 Chronic diastolic (congestive) heart failure: Secondary | ICD-10-CM

## 2021-04-24 LAB — CUP PACEART REMOTE DEVICE CHECK
Battery Remaining Longevity: 2 mo
Battery Voltage: 2.76 V
Brady Statistic AP VP Percent: 23.42 %
Brady Statistic AP VS Percent: 0 %
Brady Statistic AS VP Percent: 76.49 %
Brady Statistic AS VS Percent: 0.09 %
Brady Statistic RA Percent Paced: 20.28 %
Brady Statistic RV Percent Paced: 99.81 %
Date Time Interrogation Session: 20230105063324
HighPow Impedance: 56 Ohm
HighPow Impedance: 78 Ohm
Implantable Lead Implant Date: 20030823
Implantable Lead Implant Date: 20030823
Implantable Lead Location: 753859
Implantable Lead Location: 753860
Implantable Lead Model: 157
Implantable Lead Model: 4086
Implantable Lead Serial Number: 108215
Implantable Lead Serial Number: 112190
Implantable Pulse Generator Implant Date: 20150608
Lead Channel Impedance Value: 304 Ohm
Lead Channel Impedance Value: 361 Ohm
Lead Channel Impedance Value: 418 Ohm
Lead Channel Pacing Threshold Amplitude: 0.625 V
Lead Channel Pacing Threshold Amplitude: 1.25 V
Lead Channel Pacing Threshold Pulse Width: 0.4 ms
Lead Channel Pacing Threshold Pulse Width: 0.4 ms
Lead Channel Sensing Intrinsic Amplitude: 12.375 mV
Lead Channel Sensing Intrinsic Amplitude: 12.375 mV
Lead Channel Sensing Intrinsic Amplitude: 2 mV
Lead Channel Sensing Intrinsic Amplitude: 2 mV
Lead Channel Setting Pacing Amplitude: 2 V
Lead Channel Setting Pacing Amplitude: 3 V
Lead Channel Setting Pacing Pulse Width: 0.4 ms
Lead Channel Setting Sensing Sensitivity: 0.3 mV

## 2021-04-29 ENCOUNTER — Other Ambulatory Visit: Payer: Self-pay | Admitting: Internal Medicine

## 2021-04-29 ENCOUNTER — Other Ambulatory Visit: Payer: Self-pay | Admitting: Cardiology

## 2021-04-29 ENCOUNTER — Telehealth: Payer: Self-pay

## 2021-04-29 NOTE — Telephone Encounter (Signed)
The patient states she been having really bad SOB since yesterday. I asked the patient to send a transmission. Transmission received.

## 2021-04-29 NOTE — Telephone Encounter (Signed)
Transmission reviewed. Presenting rhythm AP/VP @ 84. Normal device function. AT/AF burden 1.0% Known history. +OAC. Patient also has a history of COPD with albuterol inhaler. Patient encouraged to utilize inhaler for wheezing and shortness of breath. Optivol rising but remains above threshold. Patient denies s/s of volume overload including swelling of hands, feet, ankles, or abdomen. Compliant with torsemide. States she follows a low NA diet. ED precautions reviewed. Patient is advised to follow up with PCP/Dr. Domenic Polite if symptoms worsen or persist. Patient verbalizes understanding.

## 2021-05-05 NOTE — Progress Notes (Signed)
Remote ICD transmission.   

## 2021-05-07 ENCOUNTER — Ambulatory Visit (HOSPITAL_COMMUNITY): Payer: Medicare PPO | Attending: Orthopedic Surgery | Admitting: Physical Therapy

## 2021-05-07 ENCOUNTER — Other Ambulatory Visit: Payer: Self-pay

## 2021-05-07 ENCOUNTER — Encounter (HOSPITAL_COMMUNITY): Payer: Self-pay | Admitting: Physical Therapy

## 2021-05-07 DIAGNOSIS — M6281 Muscle weakness (generalized): Secondary | ICD-10-CM | POA: Insufficient documentation

## 2021-05-07 DIAGNOSIS — R29898 Other symptoms and signs involving the musculoskeletal system: Secondary | ICD-10-CM | POA: Diagnosis not present

## 2021-05-07 DIAGNOSIS — M545 Low back pain, unspecified: Secondary | ICD-10-CM | POA: Diagnosis not present

## 2021-05-07 DIAGNOSIS — R2689 Other abnormalities of gait and mobility: Secondary | ICD-10-CM | POA: Diagnosis not present

## 2021-05-07 DIAGNOSIS — M48061 Spinal stenosis, lumbar region without neurogenic claudication: Secondary | ICD-10-CM | POA: Insufficient documentation

## 2021-05-07 NOTE — Therapy (Signed)
[place], [place], [address] Phone: [phone]   Fax:  [phone]  Physical Therapy Evaluation  Patient Details  Name: [name] [name] MRN: [id number] Date of Birth: [birth year] Referring Provider (PT): Arther Abbott MD   Encounter Date: 05/07/2021   PT End of Session - 05/07/21 1444     Visit Number 1    Number of Visits 12    Date for PT Re-Evaluation 06/18/21    Authorization Type [employer]    Authorization Time Period 12 visits requested check auth    Progress Note Due on Visit 10    PT Start Time 1410    PT Stop Time 1455    PT Time Calculation (min) 45 min    Activity Tolerance Patient tolerated treatment well    Behavior During Therapy [facility] for tasks assessed/performed             Past Medical History:  Diagnosis Date   Anxiety    Chronic atrial fibrillation (HCC)    Chronic back pain    Chronic diastolic heart failure ([place])    Chronic obstructive pulmonary disease, unspecified (HCC)    Complete heart block (HCC)    COPD (chronic obstructive pulmonary disease) ([place])    Coronary atherosclerosis of native coronary artery    Multivessel status post CABG, DES PLA March 2006   DDD (degenerative disc disease), lumbar    Essential hypertension    Headache    ICD (implantable cardioverter-defibrillator) in place    Mixed hyperlipidemia    Osteoarthritis    Ventricular fibrillation ([place]) 2003   a. seen on PPM interrogation a/w syncope    Past Surgical History:  Procedure Laterality Date   CARDIAC DEFIBRILLATOR PLACEMENT     MDT dual chamber ICD   CARDIOVERSION N/A 08/09/2014   Procedure: CARDIOVERSION;  Surgeon: Sanda Klein, MD;  [facility];  Service: Cardiovascular;  Laterality: N/A;   CORONARY ARTERY BYPASS GRAFT     LIMA to LAD, SVG to diagonal, SVG to ramus and OM   IMPLANTABLE CARDIOVERTER DEFIBRILLATOR GENERATOR CHANGE N/A 09/25/2013   Procedure: IMPLANTABLE  CARDIOVERTER DEFIBRILLATOR GENERATOR CHANGE;  Surgeon: Evans Lance, MD;  [facility]: [facility] CATH LAB;  Service: Cardiovascular;  Laterality: N/A;   LEAD REVISION N/A 09/29/2013   Procedure: LEAD REVISION;  Surgeon: Deboraha Sprang, MD;  Location: [facility] CATH LAB;  Service: Cardiovascular;  Laterality: N/A;   TONSILLECTOMY     YAG LASER APPLICATION Left 06/25/3426   Procedure: YAG LASER APPLICATION;  Surgeon: Elta Guadeloupe T. Gershon Crane, MD;  Location: [facility] ORS;  Service: Ophthalmology;  Laterality: Left;   YAG LASER APPLICATION Right 7/68/1157   Procedure: YAG LASER APPLICATION;  Surgeon: Elta Guadeloupe T. Gershon Crane, MD;  Location: [facility] ORS;  Service: Ophthalmology;  Laterality: Right;    There were no vitals filed for this visit.    Subjective Assessment - 05/07/21 1411     Subjective Patient is a 88 y.o. female who presents to physical therapy with c/o chronic LBP. She has spinal stenosis. She has had a gradual progression of symptoms over the last few years. Symptoms increase with walking. She gets short of breath really easy. Symptoms ease with rest. Patient states her main goal is to be able to be more active.    Pertinent History COPD    Limitations House hold activities;Walking    Patient Stated Goals to be able to be more mobile    Currently in Pain? No/denies  [facility] PT Assessment - 05/07/21 0001       Assessment   Medical Diagnosis Spinal Stenosis of Lumbar Region    Referring Provider (PT) Arther Abbott MD    Onset Date/Surgical Date 05/07/18    Prior Therapy long time ago      Precautions   Precautions None      Restrictions   Weight Bearing Restrictions No      Balance Screen   Has the patient fallen in the past 6 months No    Has the patient had a decrease in activity level because of a fear of falling?  No    Is the patient reluctant to leave their home because of a fear of falling?  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   Overall Cognitive Status Within Functional Limits for tasks assessed      Observation/Other Assessments   Observations Ambulates without AD, labored breathing    Focus on Therapeutic Outcomes (FOTO)  48% function      ROM / Strength   AROM / PROM / Strength AROM;Strength      AROM   AROM Assessment Site Lumbar    Lumbar Flexion 0% limited    Lumbar Extension 25% limited    Lumbar - Right Side Bend 25% limited    Lumbar - Left Side Bend 25% limited      Strength   Strength Assessment Site Hip;Knee;Ankle    Right/Left Hip Right;Left    Right Hip Flexion 4/5    Left Hip Flexion 4/5    Right/Left Knee Right;Left    Right Knee Flexion 4+/5    Right Knee Extension 4+/5    Left Knee Flexion 4+/5    Left Knee Extension 4+/5    Right/Left Ankle Right;Left    Right Ankle Dorsiflexion 5/5    Left Ankle Dorsiflexion 5/5      Palpation   Palpation comment no tenderness      Transfers   Five time sit to stand comments  12.19 seconds with intermittent UE support      Ambulation/Gait   Ambulation Distance (Feet) 100 Feet    Assistive device None    Gait Pattern Trunk flexed    Gait Comments labored cadence without AD                        Objective measurements completed on examination: See above findings.       [name] Adult PT Treatment/Exercise - 05/07/21 0001       Exercises   Exercises Lumbar      Lumbar Exercises: Stretches   Other Lumbar Stretch Exercise trunk flexion stretch seated 10 x 5 second holds      Lumbar Exercises: Seated   Other Seated Lumbar Exercises march 3 x 10 bilateral                     PT Education - 05/07/21 1410     Education Details Patient educated on exam findings, POC, scope of PT, HEP    Person(s) Educated Patient    Methods Explanation;Demonstration;Handout    Comprehension Verbalized understanding;Returned  demonstration              PT Short Term Goals - 05/07/21 1500  PT SHORT TERM GOAL #1   Title Patient will be independent with HEP in order to improve functional outcomes.    Time 3    Period Weeks    Status New    Target Date 05/28/21      PT SHORT TERM GOAL #2   Title Patient will report at least 25% improvement in symptoms for improved quality of life.    Time 3    Period Weeks    Status New    Target Date 05/28/21               PT Long Term Goals - 05/07/21 1502       PT LONG TERM GOAL #1   Title Patient will report at least 75% improvement in symptoms for improved quality of life.    Time 6    Period Weeks    Status New    Target Date 06/18/21      PT LONG TERM GOAL #2   Title Patient will improve FOTO score by at least 10 points in order to indicate improved tolerance to activity.    Time 6    Period Weeks    Status New    Target Date 06/18/21      PT LONG TERM GOAL #3   Title Patient will be able to complete 5x STS in under 11.4 seconds in order to reduce the risk of falls.    Time 6    Period Weeks    Status New    Target Date 06/18/21      PT LONG TERM GOAL #4   Title Patient will be able to ambulate at least 226 feet in 2MWT in order to demonstrate improved gait speed for community ambulation.    Time 6    Period Weeks    Status New    Target Date 06/18/21                    Plan - 05/07/21 1457     Clinical Impression Statement Patient is a 88 y.o. female who presents to physical therapy with c/o LBP. She presents with pain limited deficits in lumbar spine strength, ROM, endurance, postural impairments, activity tolerance, fatigue, and functional mobility with ADL. She is having to modify and restrict ADL as indicated by FOTO score as well as subjective information and objective measures which is affecting overall participation. Patient will benefit from skilled physical therapy in order to improve function and reduce  impairment.    Personal Factors and Comorbidities Comorbidity 3+;Fitness;Age;Time since onset of injury/illness/exacerbation    Comorbidities Arthritis, Back pain, BMI over 30, COPD, High Blood Pressure    Examination-Activity Limitations Locomotion Level;Transfers;Bend;Lift;Stand;Stairs;Squat    Examination-Participation Restrictions Meal Prep;Cleaning;Community Activity;Shop;Volunteer;Valla Leaver [facility]    Stability/Clinical Decision Making Stable/Uncomplicated    Clinical Decision Making Moderate    Rehab Potential Fair    PT Frequency 2x / week    PT Duration 6 weeks    PT Treatment/Interventions ADLs/Self Care Home Management;Aquatic Therapy;Electrical Stimulation;Moist Heat;Traction;DME Instruction;Gait training;Stair training;Functional mobility training;Therapeutic activities;Therapeutic exercise;Balance training;Patient/family education;Neuromuscular re-education;Manual techniques;Dry needling;Energy conservation    PT Next Visit Plan core, hip, and postural strength, endurance training - possibly trial nustep, trial flexion bias for LBP    PT Home Exercise Plan trunk flexion stretch in seated, March    Consulted and Agree with Plan of Care Patient             Patient will benefit from skilled therapeutic intervention in order  to improve the following deficits and impairments:  Abnormal gait, Difficulty walking, Decreased range of motion, Decreased endurance, Decreased activity tolerance, Pain, Decreased balance, Improper body mechanics, Impaired flexibility, Decreased mobility, Decreased strength, Postural dysfunction  Visit Diagnosis: Low back pain, unspecified back pain laterality, unspecified chronicity, unspecified whether sciatica present  Muscle weakness (generalized)  Other abnormalities of gait and mobility  Other symptoms and signs involving the musculoskeletal system     Problem List Patient Active Problem List   Diagnosis Date Noted   Essential (primary)  hypertension    Hyperlipidemia, unspecified    Presence of cardiac pacemaker    Primary pulmonary hypertension ([place])    Unspecified osteoarthritis, unspecified site    Anxiety disorder, unspecified    Atherosclerotic heart disease of native coronary artery without angina pectoris    Chronic atrial fibrillation (HCC)    Chronic diastolic (congestive) heart failure (HCC)    Chronic obstructive pulmonary disease, unspecified ([place])    Headache    Heart failure, unspecified ([place])    Unspecified fall, initial encounter    Degenerative disc disease, cervical 02/09/2018   Acute respiratory failure with hypoxia ([place]) 05/07/2017   Chronic kidney disease, stage III (moderate) (HCC) 05/07/2017   Chronic obstructive asthma with acute exacerbation ([place]) 05/05/2017   COPD exacerbation ([place]) 05/04/2017   Pacemaker complications 34/35/6861   CAP (community acquired pneumonia) 07/19/2013   Shortness of breath 07/19/2013   Chronic diastolic heart failure ([place]) 04/11/2013   Aortic stenosis 10/18/2012   Encounter for long-term (current) use of anticoagulants 10/08/2011   Mixed hyperlipidemia 08/25/2008   Essential hypertension, benign 08/25/2008   Coronary atherosclerosis of native coronary artery 08/25/2008   VENTRICULAR TACHYCARDIA 08/25/2008   Automatic implantable cardioverter-defibrillator in situ 08/25/2008    3:04 PM, 05/07/21 Mearl Latin PT, DPT Physical Therapist at [place], [place], [address] Phone: [phone]   Fax:  [phone]  Name: [name] [name] MRN: [id number] Date of Birth: 04-20-1932

## 2021-05-07 NOTE — Patient Instructions (Signed)
Access Code: [email or web address] URL: [email or web address] Date: 05/07/2021 Prepared by: Mitzi Hansen Jaysa Kise  Exercises Seated Flexion Stretch - 2 x daily - 7 x weekly - 2 sets - 10 reps - -10 second hold Seated March - 2 x daily - 7 x weekly - 3 sets - 10 reps

## 2021-05-08 ENCOUNTER — Ambulatory Visit (HOSPITAL_COMMUNITY): Payer: Medicare PPO | Admitting: Physical Therapy

## 2021-05-08 DIAGNOSIS — R29898 Other symptoms and signs involving the musculoskeletal system: Secondary | ICD-10-CM

## 2021-05-08 DIAGNOSIS — R2689 Other abnormalities of gait and mobility: Secondary | ICD-10-CM

## 2021-05-08 DIAGNOSIS — M6281 Muscle weakness (generalized): Secondary | ICD-10-CM | POA: Diagnosis not present

## 2021-05-08 DIAGNOSIS — M545 Low back pain, unspecified: Secondary | ICD-10-CM | POA: Diagnosis not present

## 2021-05-08 DIAGNOSIS — M48061 Spinal stenosis, lumbar region without neurogenic claudication: Secondary | ICD-10-CM | POA: Diagnosis not present

## 2021-05-08 NOTE — Therapy (Addendum)
[place], [place], [address] Phone: [phone]   Fax:  [phone]  Physical Therapy Treatment  Patient Details  Name: [name] [name] MRN: [id number] Date of Birth: 1931-09-13 Referring Provider (PT): Arther Abbott MD   Encounter Date: 05/08/2021   PT End of Session - 05/08/21 1615     Visit Number 2    Number of Visits 12    Date for PT Re-Evaluation 06/18/21    Authorization Type [employer]    Authorization Time Period 12 visits requested check auth    Progress Note Due on Visit 10    PT Start Time 1508    PT Stop Time 1600    PT Time Calculation (min) 52 min    Activity Tolerance Patient tolerated treatment well    Behavior During Therapy [facility] for tasks assessed/performed             Past Medical History:  Diagnosis Date   Anxiety    Chronic atrial fibrillation (HCC)    Chronic back pain    Chronic diastolic heart failure ([place])    Chronic obstructive pulmonary disease, unspecified (HCC)    Complete heart block (HCC)    COPD (chronic obstructive pulmonary disease) ([place])    Coronary atherosclerosis of native coronary artery    Multivessel status post CABG, DES PLA March 2006   DDD (degenerative disc disease), lumbar    Essential hypertension    Headache    ICD (implantable cardioverter-defibrillator) in place    Mixed hyperlipidemia    Osteoarthritis    Ventricular fibrillation ([place]) 2003   a. seen on PPM interrogation a/w syncope    Past Surgical History:  Procedure Laterality Date   CARDIAC DEFIBRILLATOR PLACEMENT     MDT dual chamber ICD   CARDIOVERSION N/A 08/09/2014   Procedure: CARDIOVERSION;  Surgeon: Sanda Klein, MD;  Location: [facility]OS[facility];  Service: Cardiovascular;  Laterality: N/A;   CORONARY ARTERY BYPASS GRAFT     LIMA to LAD, SVG to diagonal, SVG to ramus and OM   IMPLANTABLE CARDIOVERTER DEFIBRILLATOR GENERATOR CHANGE N/A 09/25/2013   Procedure: IMPLANTABLE  CARDIOVERTER DEFIBRILLATOR GENERATOR CHANGE;  Surgeon: Evans Lance, MD;  Location: [facility] CATH LAB;  Service: Cardiovascular;  Laterality: N/A;   LEAD REVISION N/A 09/29/2013   Procedure: LEAD REVISION;  Surgeon: Deboraha Sprang, MD;  Location: [facility] CATH LAB;  Service: Cardiovascular;  Laterality: N/A;   TONSILLECTOMY     YAG LASER APPLICATION Left 12/25/2227   Procedure: YAG LASER APPLICATION;  Surgeon: Elta Guadeloupe T. Gershon Crane, MD;  Location: [facility] ORS;  Service: Ophthalmology;  Laterality: Left;   YAG LASER APPLICATION Right 7/98/9211   Procedure: YAG LASER APPLICATION;  Surgeon: Elta Guadeloupe T. Gershon Crane, MD;  Location: [facility];  Service: Ophthalmology;  Laterality: Right;    There were no vitals filed for this visit.   Subjective Assessment - 05/08/21 1623     Subjective Pt was late for appointment then was in restroom for a while due to being on diuretics.  Pt very SOB when came into treatment room approximately 20 feet from bathroom. Pt reports pain of 4/10 in lower back with rest and increases with activity.                               [facility] Adult PT Treatment/Exercise - 05/08/21 0001       Lumbar Exercises: Stretches   Single Knee to Chest  Stretch 3 reps;10 seconds    Single Knee to Chest Stretch Limitations with towel    Lower Trunk Rotation 5 reps;10 seconds    Other Lumbar Stretch Exercise trunk flexion stretch seated 10 x 5 second holds HEP review      Lumbar Exercises: Aerobic   Nustep at end of session (not billable) 5 minutes with UE/LE Level 1 SPM between 50-56      Lumbar Exercises: Seated   Other Seated Lumbar Exercises march 3 x 10 bilateral      Lumbar Exercises: Supine   Ab Set 10 reps;3 seconds    Glut Set 10 reps;4 seconds    Bridge 10 reps    Straight Leg Raise 5 reps      Lumbar Exercises: Sidelying   Clam 10 reps;Both;3 seconds    Hip Abduction Both;10 reps                     PT Education - 05/08/21 1617     Education Details diaphragmatic  breathing, HEP and goal review; update of HEP    Person(s) Educated Patient    Methods Demonstration;Explanation;Tactile cues;Verbal cues;Handout    Comprehension Verbalized understanding;Returned demonstration;Verbal cues required;Tactile cues required;Need further instruction              PT Short Term Goals - 05/08/21 1538       PT SHORT TERM GOAL #1   Title Patient will be independent with HEP in order to improve functional outcomes.    Time 3    Period Weeks    Status On-going    Target Date 05/28/21      PT SHORT TERM GOAL #2   Title Patient will report at least 25% improvement in symptoms for improved quality of life.    Time 3    Period Weeks    Status On-going    Target Date 05/28/21               PT Long Term Goals - 05/08/21 1539       PT LONG TERM GOAL #1   Title Patient will report at least 75% improvement in symptoms for improved quality of life.    Time 6    Period Weeks    Status On-going    Target Date 06/18/21      PT LONG TERM GOAL #2   Title Patient will improve FOTO score by at least 10 points in order to indicate improved tolerance to activity.    Time 6    Period Weeks    Status On-going    Target Date 06/18/21      PT LONG TERM GOAL #3   Title Patient will be able to complete 5x STS in under 11.4 seconds in order to reduce the risk of falls.    Time 6    Period Weeks    Status On-going    Target Date 06/18/21      PT LONG TERM GOAL #4   Title Patient will be able to ambulate at least 226 feet in 2MWT in order to demonstrate improved gait speed for community ambulation.    Time 6    Period Weeks    Status On-going    Target Date 06/18/21                   Plan - 05/08/21 1537     Clinical Impression Statement Reviewed goals, HEP and POC moving forward.  Educated on breathing using  diaphragm and on core stability through abdominal isometrics; glute isometrics.  Pt with difficulty completing this without holding her  breath.  Began with abdominal bracing technique and progressed to LE strengthening.    Pt with difficulty with hold times and keeping correct count of repetitions.  Sidelying clams and abduction added with tactile cues for form.  Completed session with nustep with SPM maintained at 50-56 .  Pt without complaints of pain during or at end of session.  Frequent rest breaks due to shortness of breath and constant cues for breathing technique.    Personal Factors and Comorbidities Comorbidity 3+;Fitness;Age;Time since onset of injury/illness/exacerbation    Comorbidities Arthritis, Back pain, BMI over 30, COPD, High Blood Pressure    Examination-Activity Limitations Locomotion Level;Transfers;Bend;Lift;Stand;Stairs;Squat    Examination-Participation Restrictions Meal Prep;Cleaning;Community Activity;Shop;Volunteer;[facility]alla Leaver [facility]    Stability/Clinical Decision Making Stable/Uncomplicated    Rehab Potential Fair    PT Frequency 2x / week    PT Duration 6 weeks    PT Treatment/Interventions ADLs/Self Care Home Management;Aquatic Therapy;Electrical Stimulation;Moist Heat;Traction;DME Instruction;Gait training;Stair training;Functional mobility training;Therapeutic activities;Therapeutic exercise;Balance training;Patient/family education;Neuromuscular re-education;Manual techniques;Dry needling;Energy conservation    PT Next Visit Plan core, hip, and postural strength.  continue to work on breathing technques. Trial flexion bias for LBP    PT Home Exercise Plan trunk flexion stretch in seated, March  1/19:  diaphragmatic breathing, abdominal isometrics, glute isometrics, bridging    Consulted and Agree with Plan of Care Patient             Patient will benefit from skilled therapeutic intervention in order to improve the following deficits and impairments:  Abnormal gait, Difficulty walking, Decreased range of motion, Decreased endurance, Decreased activity tolerance, Pain, Decreased balance, Improper  body mechanics, Impaired flexibility, Decreased mobility, Decreased strength, Postural dysfunction  Visit Diagnosis: Low back pain, unspecified back pain laterality, unspecified chronicity, unspecified whether sciatica present  Muscle weakness (generalized)  Other abnormalities of gait and mobility  Other symptoms and signs involving the musculoskeletal system     Problem List Patient Active Problem List   Diagnosis Date Noted   Essential (primary) hypertension    Hyperlipidemia, unspecified    Presence of cardiac pacemaker    Primary pulmonary hypertension (HCC)    Unspecified osteoarthritis, unspecified site    Anxiety disorder, unspecified    Atherosclerotic heart disease of native coronary artery without angina pectoris    Chronic atrial fibrillation (HCC)    Chronic diastolic (congestive) heart failure (HCC)    Chronic obstructive pulmonary disease, unspecified ([place])    Headache    Heart failure, unspecified ([place])    Unspecified fall, initial encounter    Degenerative disc disease, cervical 02/09/2018   Acute respiratory failure with hypoxia ([place]) 05/07/2017   Chronic kidney disease, stage III (moderate) (HCC) 05/07/2017   Chronic obstructive asthma with acute exacerbation ([place]) 05/05/2017   COPD exacerbation ([place]) 05/04/2017   Pacemaker complications 83/38/2505   CAP (community acquired pneumonia) 07/19/2013   Shortness of breath 07/19/2013   Chronic diastolic heart failure ([place]) 04/11/2013   Aortic stenosis 10/18/2012   Encounter for long-term (current) use of anticoagulants 10/08/2011   Mixed hyperlipidemia 08/25/2008   Essential hypertension, benign 08/25/2008   Coronary atherosclerosis of native coronary artery 08/25/2008   VENTRICULAR TACHYCARDIA 08/25/2008   Automatic implantable cardioverter-defibrillator in situ 08/25/2008   Teena Irani, PTA/CLT, WTA [phone]  Teena Irani, PTA 05/08/2021, 4:24 PM  [place], [place], [address]  Phone: [phone]   Fax:  [phone]  Name: [name] [name] MRN: [id number] Date of Birth: [birth year]

## 2021-05-08 NOTE — Patient Instructions (Addendum)
Diaphragmatic Breathing Diaphragmatic breathing is a breathing exercise that uses the dome-shaped muscle (diaphragm) separating your chest from your abdomen. Using this muscle to breathe is the best way to breathe deeply. When doing diaphragmatic breathing, it is important to feel the movement of your abdomen. It should expand when you breathe in (inhale) and go down when you breathe out (exhale). This type of breathing is also called belly breathing. You may be asked to learn diaphragmatic breathing if: You have a lung disease like chronic obstructive pulmonary disease (COPD). You have shortness of breath at rest or with activity. You have anxiety or trouble relaxing. You have respiratory complications after surgery. How to perform diaphragmatic breathing You should practice this breathing exercise several times a day until you are comfortable doing it at rest. Then use diaphragmatic breathing while doing activity to decrease your shortness of breath. Before you begin, take a minute to close your eyes and relax your shoulders, chest, and neck. You can do this breathing exercise lying on your back or sitting in a chair. Place both hands on your abdomen just under your rib cage. Breathe in slowly and deeply through your nose. Focus on your diaphragm or abdomen to breathe. The hand on your abdomen should rise. Breathe out slowly through pursed (puckered) lips as if you are blowing out a candle. The hand on your abdomen should lower. Repeat steps 4 and 5 as told by your health care provider. Ask your health care provider how many breaths to take and how often to do the breathing exercise. Follow these instructions at home:  Take over-the-counter and prescription medicines only as told by your health care provider. Do not use any products that contain nicotine or tobacco. These products include cigarettes, chewing tobacco, and vaping devices, such as e-cigarettes. If you need help quitting, ask your  health care provider. Return to your normal activities as told by your health care provider. Ask your health care provider what activities are safe for you. Keep all follow-up visits. This is important. Where to find more information American Lung Association: lung.org COPD Foundation: copdfoundation.org Contact a health care provider if: Your shortness of breath gets worse. You become less able to exercise or be physically active. You develop a cough. You develop a fever. You experience problems with this breathing technique. Get help right away if: You are struggling to breathe. Your shortness of breath prevents you from doing any activity. These symptoms may represent a serious problem that is an emergency. Do not wait to see if the symptoms will go away. Get medical help right away. Call your local emergency services (911 in the [place] Do not drive yourself to the hospital. Summary Diaphragmatic breathing is a breathing exercise that uses the dome-shaped muscle (diaphragm) separating your chest from your abdomen. Using this muscle to breathe is the best way to breathe deeply. You can do diaphragmatic breathing lying on your back or sitting in a chair. When doing this breathing exercise, it is important to feel the movement of your abdomen. It should expand when you breathe in and go down when you breathe out. Follow your health care provider's instructions about how often to do diaphragmatic breathing and how many breaths to take. This information is not intended to replace advice given to you by your health care provider. Make sure you discuss any questions you have with your health care provider. Document Revised: 04/08/2020 Document Reviewed: 04/08/2020 Elsevier Patient Education  [place]  Lie back, legs bent. Inhale, pressing hips up. Keeping ribs in, lengthen lower back. Exhale, rolling down along spine from top. Repeat __10__ times. Do __2__ sessions per  day.    Gluteal Sets    Squeeze pelvic floor and hold. Tighten bottom. Hold for _3__ seconds. Relax for _5__ seconds. Repeat _10__ times. Do _2__ times a day.   Abdominal Bracing With Pelvic Floor (Hook-Lying)    With neutral spine, tighten pelvic floor and abdominals. Repeat _10__ times. Do __2_ times a day.

## 2021-05-13 ENCOUNTER — Ambulatory Visit (HOSPITAL_COMMUNITY): Payer: Medicare PPO | Admitting: Physical Therapy

## 2021-05-13 ENCOUNTER — Other Ambulatory Visit: Payer: Self-pay

## 2021-05-13 DIAGNOSIS — M6281 Muscle weakness (generalized): Secondary | ICD-10-CM | POA: Diagnosis not present

## 2021-05-13 DIAGNOSIS — M48061 Spinal stenosis, lumbar region without neurogenic claudication: Secondary | ICD-10-CM | POA: Diagnosis not present

## 2021-05-13 DIAGNOSIS — R29898 Other symptoms and signs involving the musculoskeletal system: Secondary | ICD-10-CM | POA: Diagnosis not present

## 2021-05-13 DIAGNOSIS — R2689 Other abnormalities of gait and mobility: Secondary | ICD-10-CM | POA: Diagnosis not present

## 2021-05-13 DIAGNOSIS — M545 Low back pain, unspecified: Secondary | ICD-10-CM | POA: Diagnosis not present

## 2021-05-13 NOTE — Therapy (Signed)
[place], [place], [address] Phone: [phone]   Fax:  [phone]  Physical Therapy Treatment  Patient Details  Name: [name] [name] MRN: [id number] Date of Birth: [birth year] Referring Provider (PT): Arther Abbott MD   Encounter Date: 05/13/2021   PT End of Session - 05/13/21 1640     Visit Number 3    Number of Visits 12    Date for PT Re-Evaluation 06/18/21    Authorization Type [employer]    Authorization Time Period 12 visits requested check auth    Progress Note Due on Visit 10    PT Start Time 1536    PT Stop Time 1620    PT Time Calculation (min) 44 min    Activity Tolerance Patient tolerated treatment well    Behavior During Therapy [facility] for tasks assessed/performed             Past Medical History:  Diagnosis Date   Anxiety    Chronic atrial fibrillation (HCC)    Chronic back pain    Chronic diastolic heart failure ([place])    Chronic obstructive pulmonary disease, unspecified (HCC)    Complete heart block (HCC)    COPD (chronic obstructive pulmonary disease) ([place])    Coronary atherosclerosis of native coronary artery    Multivessel status post CABG, DES PLA March 2006   DDD (degenerative disc disease), lumbar    Essential hypertension    Headache    ICD (implantable cardioverter-defibrillator) in place    Mixed hyperlipidemia    Osteoarthritis    Ventricular fibrillation ([place]) 2003   a. seen on PPM interrogation a/w syncope    Past Surgical History:  Procedure Laterality Date   CARDIAC DEFIBRILLATOR PLACEMENT     MDT dual chamber ICD   CARDIOVERSION N/A 08/09/2014   Procedure: CARDIOVERSION;  Surgeon: Sanda Klein, MD;  [facility];  Service: Cardiovascular;  Laterality: N/A;   CORONARY ARTERY BYPASS GRAFT     LIMA to LAD, SVG to diagonal, SVG to ramus and OM   IMPLANTABLE CARDIOVERTER DEFIBRILLATOR GENERATOR CHANGE N/A 09/25/2013   Procedure: IMPLANTABLE  CARDIOVERTER DEFIBRILLATOR GENERATOR CHANGE;  Surgeon: Evans Lance, MD;  Location: [facility] CATH LAB;  Service: Cardiovascular;  Laterality: N/A;   LEAD REVISION N/A 09/29/2013   Procedure: LEAD REVISION;  Surgeon: Deboraha Sprang, MD;  Location: [facility] CATH LAB;  Service: Cardiovascular;  Laterality: N/A;   TONSILLECTOMY     YAG LASER APPLICATION Left 09/26/6293   Procedure: YAG LASER APPLICATION;  Surgeon: Elta Guadeloupe T. Gershon Crane, MD;  Location: [facility] ORS;  Service: Ophthalmology;  Laterality: Left;   YAG LASER APPLICATION Right 2/84/1324   Procedure: YAG LASER APPLICATION;  Surgeon: Elta Guadeloupe T. Gershon Crane, MD;  Location: [facility] ORS;  Service: Ophthalmology;  Laterality: Right;    There were no vitals filed for this visit.   Subjective Assessment - 05/13/21 1541     Subjective Pt admits to not doing the exercises as she should.  Currently wtihout pain but increases with ambulation and this has not changed.  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    Currently in Pain? No/denies                               [facility] Adult PT Treatment/Exercise - 05/13/21 0001       Lumbar Exercises: Stretches   Active Hamstring Stretch Left;Right;4  reps;20 seconds    Active Hamstring Stretch Limitations long sitting    Single Knee to Chest Stretch 3 reps;10 seconds    Other Lumbar Stretch Exercise trunk flexion stretch seated 10 x 5 second holds HEP review      Lumbar Exercises: Aerobic   Nustep at end of session 5 minutes with UE/LE Level 1 SPM between 50-56      Lumbar Exercises: Seated   Other Seated Lumbar Exercises marching 15X alternating, LAQ 10X5"    Other Seated Lumbar Exercises thoracic excursions with UE movements 5X      Lumbar Exercises: Supine   Ab Set 10 reps;3 seconds    Glut Set 10 reps;4 seconds    Heel Slides 10 reps    Heel Slides Limitations with core stab    Bridge 10 reps                       PT Short Term Goals - 05/08/21 1538        PT SHORT TERM GOAL #1   Title Patient will be independent with HEP in order to improve functional outcomes.    Time 3    Period Weeks    Status On-going    Target Date 05/28/21      PT SHORT TERM GOAL #2   Title Patient will report at least 25% improvement in symptoms for improved quality of life.    Time 3    Period Weeks    Status On-going    Target Date 05/28/21               PT Long Term Goals - 05/08/21 1539       PT LONG TERM GOAL #1   Title Patient will report at least 75% improvement in symptoms for improved quality of life.    Time 6    Period Weeks    Status On-going    Target Date 06/18/21      PT LONG TERM GOAL #2   Title Patient will improve FOTO score by at least 10 points in order to indicate improved tolerance to activity.    Time 6    Period Weeks    Status On-going    Target Date 06/18/21      PT LONG TERM GOAL #3   Title Patient will be able to complete 5x STS in under 11.4 seconds in order to reduce the risk of falls.    Time 6    Period Weeks    Status On-going    Target Date 06/18/21      PT LONG TERM GOAL #4   Title Patient will be able to ambulate at least 226 feet in 2MWT in order to demonstrate improved gait speed for community ambulation.    Time 6    Period Weeks    Status On-going    Target Date 06/18/21                   Plan - 05/13/21 1640     Clinical Impression Statement Added thoracic excursions with UE movements to improve spinal mobility.  Pt with cues to use eye gaze to engage upper thoracic.   Pt required cues for form and encouragement throughout session today.  Finished again on the nustep with noted improvement in ability to maintain SPM as compared to last session.  Educated on importance of completing HEP for optimal results.  Pt with questions regarding her nightly mm cramps.  Educated to increase her water intake and that as she gets stronger her mm will be less likely to cramp as well.  Pt  verbalized understanding.    Personal Factors and Comorbidities Comorbidity 3+;Fitness;Age;Time since onset of injury/illness/exacerbation    Comorbidities Arthritis, Back pain, BMI over 30, COPD, High Blood Pressure    Examination-Activity Limitations Locomotion Level;Transfers;Bend;Lift;Stand;Stairs;Squat    Examination-Participation Restrictions Meal Prep;Cleaning;Community Activity;Shop;Volunteer;Valla Leaver [facility]    Stability/Clinical Decision Making Stable/Uncomplicated    Rehab Potential Fair    PT Frequency 2x / week    PT Duration 6 weeks    PT Treatment/Interventions ADLs/Self Care Home Management;Aquatic Therapy;Electrical Stimulation;Moist Heat;Traction;DME Instruction;Gait training;Stair training;Functional mobility training;Therapeutic activities;Therapeutic exercise;Balance training;Patient/family education;Neuromuscular re-education;Manual techniques;Dry needling;Energy conservation    PT Next Visit Plan core, hip, and postural strength.  continue to work on breathing technques.    PT Home Exercise Plan trunk flexion stretch in seated, March  1/19:  diaphragmatic breathing, abdominal isometrics, glute isometrics, bridging    Consulted and Agree with Plan of Care Patient             Patient will benefit from skilled therapeutic intervention in order to improve the following deficits and impairments:  Abnormal gait, Difficulty walking, Decreased range of motion, Decreased endurance, Decreased activity tolerance, Pain, Decreased balance, Improper body mechanics, Impaired flexibility, Decreased mobility, Decreased strength, Postural dysfunction  Visit Diagnosis: Low back pain, unspecified back pain laterality, unspecified chronicity, unspecified whether sciatica present  Muscle weakness (generalized)  Other abnormalities of gait and mobility  Other symptoms and signs involving the musculoskeletal system     Problem List Patient Active Problem List   Diagnosis Date  Noted   Essential (primary) hypertension    Hyperlipidemia, unspecified    Presence of cardiac pacemaker    Primary pulmonary hypertension (HCC)    Unspecified osteoarthritis, unspecified site    Anxiety disorder, unspecified    Atherosclerotic heart disease of native coronary artery without angina pectoris    Chronic atrial fibrillation (HCC)    Chronic diastolic (congestive) heart failure (HCC)    Chronic obstructive pulmonary disease, unspecified ([place])    Headache    Heart failure, unspecified ([place])    Unspecified fall, initial encounter    Degenerative disc disease, cervical 02/09/2018   Acute respiratory failure with hypoxia ([place]) 05/07/2017   Chronic kidney disease, stage III (moderate) (HCC) 05/07/2017   Chronic obstructive asthma with acute exacerbation ([place]) 05/05/2017   COPD exacerbation ([place]) 05/04/2017   Pacemaker complications 25/00/3704   CAP (community acquired pneumonia) 07/19/2013   Shortness of breath 07/19/2013   Chronic diastolic heart failure ([place]) 04/11/2013   Aortic stenosis 10/18/2012   Encounter for long-term (current) use of anticoagulants 10/08/2011   Mixed hyperlipidemia 08/25/2008   Essential hypertension, benign 08/25/2008   Coronary atherosclerosis of native coronary artery 08/25/2008   VENTRICULAR TACHYCARDIA 08/25/2008   Automatic implantable cardioverter-defibrillator in situ 08/25/2008   Teena Irani, PTA/CLT, WTA [phone]  Teena Irani, PTA 05/13/2021, 4:46 PM  [facility] [facility] [place], [place], [address] Phone: [phone]   Fax:  [phone]  Name: VELVIA MEHRER MRN: [id number] Date of Birth: May 28, 1931

## 2021-05-16 ENCOUNTER — Other Ambulatory Visit: Payer: Self-pay

## 2021-05-16 ENCOUNTER — Ambulatory Visit (HOSPITAL_COMMUNITY): Payer: Medicare PPO | Admitting: Physical Therapy

## 2021-05-16 ENCOUNTER — Encounter (HOSPITAL_COMMUNITY): Payer: Self-pay | Admitting: Physical Therapy

## 2021-05-16 DIAGNOSIS — M6281 Muscle weakness (generalized): Secondary | ICD-10-CM

## 2021-05-16 DIAGNOSIS — R29898 Other symptoms and signs involving the musculoskeletal system: Secondary | ICD-10-CM | POA: Diagnosis not present

## 2021-05-16 DIAGNOSIS — M545 Low back pain, unspecified: Secondary | ICD-10-CM | POA: Diagnosis not present

## 2021-05-16 DIAGNOSIS — R2689 Other abnormalities of gait and mobility: Secondary | ICD-10-CM | POA: Diagnosis not present

## 2021-05-16 DIAGNOSIS — M48061 Spinal stenosis, lumbar region without neurogenic claudication: Secondary | ICD-10-CM | POA: Diagnosis not present

## 2021-05-16 NOTE — Therapy (Signed)
[place], [place], [address] Phone: [phone]   Fax:  [phone]  Physical Therapy Treatment  Patient Details  Name: [name] [name] MRN: [id number] Date of Birth: 10-28-1931 Referring Provider (PT): Arther Abbott MD   Encounter Date: 05/16/2021   PT End of Session - 05/16/21 1445     Visit Number 4    Number of Visits 12    Date for PT Re-Evaluation 06/18/21    Authorization Type [employer]    Authorization Time Period 12 visits approved 1/18-06/18/21    Authorization - Number of Visits 4    Progress Note Due on Visit 10    PT Start Time 1440    PT Stop Time 1519    PT Time Calculation (min) 39 min    Activity Tolerance Patient tolerated treatment well;Patient limited by fatigue    Behavior During Therapy [facility] for tasks assessed/performed             Past Medical History:  Diagnosis Date   Anxiety    Chronic atrial fibrillation (HCC)    Chronic back pain    Chronic diastolic heart failure ([place])    Chronic obstructive pulmonary disease, unspecified (HCC)    Complete heart block (HCC)    COPD (chronic obstructive pulmonary disease) ([place])    Coronary atherosclerosis of native coronary artery    Multivessel status post CABG, DES PLA March 2006   DDD (degenerative disc disease), lumbar    Essential hypertension    Headache    ICD (implantable cardioverter-defibrillator) in place    Mixed hyperlipidemia    Osteoarthritis    Ventricular fibrillation ([place]) 2003   a. seen on PPM interrogation a/w syncope    Past Surgical History:  Procedure Laterality Date   CARDIAC DEFIBRILLATOR PLACEMENT     MDT dual chamber ICD   CARDIOVERSION N/A 08/09/2014   Procedure: CARDIOVERSION;  Surgeon: Sanda Klein, MD;  [facility]: [facility];  Service: Cardiovascular;  Laterality: N/A;   CORONARY ARTERY BYPASS GRAFT     LIMA to LAD, SVG to diagonal, SVG to ramus and OM   IMPLANTABLE CARDIOVERTER  DEFIBRILLATOR GENERATOR CHANGE N/A 09/25/2013   Procedure: IMPLANTABLE CARDIOVERTER DEFIBRILLATOR GENERATOR CHANGE;  Surgeon: Evans Lance, MD;  Location: [facility] CATH LAB;  Service: Cardiovascular;  Laterality: N/A;   LEAD REVISION N/A 09/29/2013   Procedure: LEAD REVISION;  Surgeon: Deboraha Sprang, MD;  Location: [facility] CATH LAB;  Service: Cardiovascular;  Laterality: N/A;   TONSILLECTOMY     YAG LASER APPLICATION Left 09/26/6293   Procedure: YAG LASER APPLICATION;  Surgeon: Elta Guadeloupe T. Gershon Crane, MD;  Location: [facility]S;  Service: Ophthalmology;  Laterality: Left;   YAG LASER APPLICATION Right 2/84/1324   Procedure: YAG LASER APPLICATION;  Surgeon: Elta Guadeloupe T. Gershon Crane, MD;  Location: [facility];  Service: Ophthalmology;  Laterality: Right;    There were no vitals filed for this visit.   Subjective Assessment - 05/16/21 1442     Subjective Patient says exercises werent too hard, but she gets short of breath. No pain currently.    Pertinent History COPD    Currently in Pain? No/denies                               [facility] Adult PT Treatment/Exercise - 05/16/21 0001       Lumbar Exercises: Standing   Heel Raises 20 reps    Other Standing Lumbar  Exercises sidestepping at mat 6 RT      Lumbar Exercises: Seated   Sit to Stand 10 reps      Lumbar Exercises: Supine   Ab Set 10 reps;5 seconds    Glut Set 10 reps;5 seconds    Bent Knee Raise 20 reps    Bridge 20 reps    Straight Leg Raise 20 reps                       PT Short Term Goals - 05/08/21 1538       PT SHORT TERM GOAL #1   Title Patient will be independent with HEP in order to improve functional outcomes.    Time 3    Period Weeks    Status On-going    Target Date 05/28/21      PT SHORT TERM GOAL #2   Title Patient will report at least 25% improvement in symptoms for improved quality of life.    Time 3    Period Weeks    Status On-going    Target Date 05/28/21               PT Long Term Goals -  05/08/21 1539       PT LONG TERM GOAL #1   Title Patient will report at least 75% improvement in symptoms for improved quality of life.    Time 6    Period Weeks    Status On-going    Target Date 06/18/21      PT LONG TERM GOAL #2   Title Patient will improve FOTO score by at least 10 points in order to indicate improved tolerance to activity.    Time 6    Period Weeks    Status On-going    Target Date 06/18/21      PT LONG TERM GOAL #3   Title Patient will be able to complete 5x STS in under 11.4 seconds in order to reduce the risk of falls.    Time 6    Period Weeks    Status On-going    Target Date 06/18/21      PT LONG TERM GOAL #4   Title Patient will be able to ambulate at least 226 feet in 2MWT in order to demonstrate improved gait speed for community ambulation.    Time 6    Period Weeks    Status On-going    Target Date 06/18/21                   Plan - 05/16/21 1517     Clinical Impression Statement Patient tolerated session well overall. Requires frequent breaks for rest due to fatigue and COPD. Educated patient on walking program and added sidestepping at counter to HEP to progress conditioning with ambulatory activity. Educated patient on purpose of pursed lip breathing for improved O2 saturation when increasing activity. Patient cued on core activation during ab set and ab marching. Issued updated HEP handout. Patient will continue to benefit from skilled therapy services to reduce deficits and improve functional level.    Personal Factors and Comorbidities Comorbidity 3+;Fitness;Age;Time since onset of injury/illness/exacerbation    Comorbidities Arthritis, Back pain, BMI over 30, COPD, High Blood Pressure    Examination-Activity Limitations Locomotion Level;Transfers;Bend;Lift;Stand;Stairs;Squat    Examination-Participation Restrictions Meal Prep;Cleaning;Community Activity;Shop;Volunteer;Valla Leaver [facility]    Stability/Clinical Decision Making  Stable/Uncomplicated    Rehab Potential Fair    PT Frequency 2x / week    PT  Duration 6 weeks    PT Treatment/Interventions ADLs/Self Care Home Management;Aquatic Therapy;Electrical Stimulation;Moist Heat;Traction;DME Instruction;Gait training;Stair training;Functional mobility training;Therapeutic activities;Therapeutic exercise;Balance training;Patient/family education;Neuromuscular re-education;Manual techniques;Dry needling;Energy conservation    PT Next Visit Plan core, hip, and postural strength.  continue to work on breathing technques.    PT Home Exercise Plan trunk flexion stretch in seated, March  1/19:  diaphragmatic breathing, abdominal isometrics, glute isometrics, bridging 1/27 heel raise, sidestepping at counter, ab march SLR    Consulted and Agree with Plan of Care Patient             Patient will benefit from skilled therapeutic intervention in order to improve the following deficits and impairments:  Abnormal gait, Difficulty walking, Decreased range of motion, Decreased endurance, Decreased activity tolerance, Pain, Decreased balance, Improper body mechanics, Impaired flexibility, Decreased mobility, Decreased strength, Postural dysfunction  Visit Diagnosis: Low back pain, unspecified back pain laterality, unspecified chronicity, unspecified whether sciatica present  Muscle weakness (generalized)  Other abnormalities of gait and mobility  Other symptoms and signs involving the musculoskeletal system     Problem List Patient Active Problem List   Diagnosis Date Noted   Essential (primary) hypertension    Hyperlipidemia, unspecified    Presence of cardiac pacemaker    Primary pulmonary hypertension ([place])    Unspecified osteoarthritis, unspecified site    Anxiety disorder, unspecified    Atherosclerotic heart disease of native coronary artery without angina pectoris    Chronic atrial fibrillation (HCC)    Chronic diastolic (congestive) heart failure (HCC)     Chronic obstructive pulmonary disease, unspecified ([place])    Headache    Heart failure, unspecified ([place])    Unspecified fall, initial encounter    Degenerative disc disease, cervical 02/09/2018   Acute respiratory failure with hypoxia ([place]) 05/07/2017   Chronic kidney disease, stage III (moderate) ([place]) 05/07/2017   Chronic obstructive asthma with acute exacerbation ([place]) 05/05/2017   COPD exacerbation ([place]) 05/04/2017   Pacemaker complications 73/42/8768   CAP (community acquired pneumonia) 07/19/2013   Shortness of breath 07/19/2013   Chronic diastolic heart failure ([place]) 04/11/2013   Aortic stenosis 10/18/2012   Encounter for long-term (current) use of anticoagulants 10/08/2011   Mixed hyperlipidemia 08/25/2008   Essential hypertension, benign 08/25/2008   Coronary atherosclerosis of native coronary artery 08/25/2008   VENTRICULAR TACHYCARDIA 08/25/2008   Automatic implantable cardioverter-defibrillator in situ 08/25/2008   3:31 PM, 05/16/21 Josue Hector PT DPT  Physical Therapist with [facility]  [phone] [place], [place], [address] Phone: [phone]   Fax:  [phone]  Name: [name] [name] MRN: [id number] Date of Birth: 10/20/1931

## 2021-05-16 NOTE — Patient Instructions (Signed)
Access Code: [email or web address] URL: [email or web address] Date: 05/16/2021 Prepared by: Josue Hector  Exercises Supine Transversus Abdominis Bracing - Hands on Stomach - 3 x daily - 7 x weekly - 1-2 sets - 10 reps - 5 second hold Supine March - 3 x daily - 7 x weekly - 1-2 sets - 10 reps Supine Bridge - 3 x daily - 7 x weekly - 1-2 sets - 10 reps Supine Straight Leg Raises - 3 x daily - 7 x weekly - 1-2 sets - 10 reps Side Stepping with Counter Support - 3 x daily - 7 x weekly - 1 sets - 10 reps Heel Raises with Counter Support - 3 x daily - 7 x weekly - 2 sets - 10 reps

## 2021-05-18 DIAGNOSIS — I10 Essential (primary) hypertension: Secondary | ICD-10-CM | POA: Diagnosis not present

## 2021-05-18 DIAGNOSIS — E785 Hyperlipidemia, unspecified: Secondary | ICD-10-CM | POA: Diagnosis not present

## 2021-05-20 ENCOUNTER — Other Ambulatory Visit: Payer: Self-pay

## 2021-05-20 ENCOUNTER — Encounter (HOSPITAL_COMMUNITY): Payer: Self-pay | Admitting: Physical Therapy

## 2021-05-20 ENCOUNTER — Ambulatory Visit (HOSPITAL_COMMUNITY): Payer: Medicare PPO | Admitting: Physical Therapy

## 2021-05-20 DIAGNOSIS — R29898 Other symptoms and signs involving the musculoskeletal system: Secondary | ICD-10-CM

## 2021-05-20 DIAGNOSIS — M545 Low back pain, unspecified: Secondary | ICD-10-CM

## 2021-05-20 DIAGNOSIS — R2689 Other abnormalities of gait and mobility: Secondary | ICD-10-CM

## 2021-05-20 DIAGNOSIS — M48061 Spinal stenosis, lumbar region without neurogenic claudication: Secondary | ICD-10-CM | POA: Diagnosis not present

## 2021-05-20 DIAGNOSIS — M6281 Muscle weakness (generalized): Secondary | ICD-10-CM

## 2021-05-20 NOTE — Therapy (Signed)
[place], [place], [address] Phone: [phone]   Fax:  [phone]  Physical Therapy Treatment  Patient Details  Name: [name] [name] MRN: [id number] Date of Birth: 17-Nov-1931 Referring Provider (PT): Arther Abbott MD   Encounter Date: 05/20/2021   PT End of Session - 05/20/21 1446     Visit Number 5    Number of Visits 12    Date for PT Re-Evaluation 06/18/21    Authorization Type [employer]    Authorization Time Period 12 visits approved 1/18-06/18/21    Authorization - Number of Visits 5    Progress Note Due on Visit 10    PT Start Time 1445    PT Stop Time 1523    PT Time Calculation (min) 38 min    Activity Tolerance Patient tolerated treatment well;Patient limited by fatigue    Behavior During Therapy [facility] for tasks assessed/performed             Past Medical History:  Diagnosis Date   Anxiety    Chronic atrial fibrillation (HCC)    Chronic back pain    Chronic diastolic heart failure ([place])    Chronic obstructive pulmonary disease, unspecified (HCC)    Complete heart block (HCC)    COPD (chronic obstructive pulmonary disease) ([place])    Coronary atherosclerosis of native coronary artery    Multivessel status post CABG, DES PLA March 2006   DDD (degenerative disc disease), lumbar    Essential hypertension    Headache    ICD (implantable cardioverter-defibrillator) in place    Mixed hyperlipidemia    Osteoarthritis    Ventricular fibrillation ([place]) 2003   a. seen on PPM interrogation a/w syncope    Past Surgical History:  Procedure Laterality Date   CARDIAC DEFIBRILLATOR PLACEMENT     MDT dual chamber ICD   CARDIOVERSION N/A 08/09/2014   Procedure: CARDIOVERSION;  Surgeon: Sanda Klein, MD;  [facility];  Service: Cardiovascular;  Laterality: N/A;   CORONARY ARTERY BYPASS GRAFT     LIMA to LAD, SVG to diagonal, SVG to ramus and OM   IMPLANTABLE CARDIOVERTER  DEFIBRILLATOR GENERATOR CHANGE N/A 09/25/2013   Procedure: IMPLANTABLE CARDIOVERTER DEFIBRILLATOR GENERATOR CHANGE;  Surgeon: Evans Lance, MD;  Location: [facility] CATH LAB;  Service: Cardiovascular;  Laterality: N/A;   LEAD REVISION N/A 09/29/2013   Procedure: LEAD REVISION;  Surgeon: Deboraha Sprang, MD;  Location: [facility] CATH LAB;  Service: Cardiovascular;  Laterality: N/A;   TONSILLECTOMY     YAG LASER APPLICATION Left 09/21/8754   Procedure: YAG LASER APPLICATION;  Surgeon: Elta Guadeloupe T. Gershon Crane, MD;  Location: [facility];  Service: Ophthalmology;  Laterality: Left;   YAG LASER APPLICATION Right 4/33/2951   Procedure: YAG LASER APPLICATION;  Surgeon: Elta Guadeloupe T. Gershon Crane, MD;  Location: [facility];  Service: Ophthalmology;  Laterality: Right;    There were no vitals filed for this visit.   Subjective Assessment - 05/20/21 1447     Subjective Patient states she has been about the same. she ahs been working on HEP a little    Pertinent History COPD    Currently in Pain? No/denies                               [facility] Adult PT Treatment/Exercise - 05/20/21 0001       Lumbar Exercises: Stretches   Other Lumbar Stretch Exercise trunk flexion stretch seated 10  x 5 second holds with green ball      Lumbar Exercises: Aerobic   Nustep at end of session 5 minutes with UE/LE Level 1 SPM between 50-56      Lumbar Exercises: Standing   Heel Raises 10 reps    Heel Raises Limitations 2 sets; TR 2 x 10    Other Standing Lumbar Exercises hip abduction2x 10 bilateral, alternating march 2x 10 bilateral      Lumbar Exercises: Seated   Sit to Stand 10 reps    Sit to Stand Limitations 2 sets                     PT Education - 05/20/21 1446     Education Details HEP    Person(s) Educated Patient    Methods Explanation;Demonstration    Comprehension Verbalized understanding;Returned demonstration              PT Short Term Goals - 05/08/21 1538       PT SHORT TERM GOAL #1   Title  Patient will be independent with HEP in order to improve functional outcomes.    Time 3    Period Weeks    Status On-going    Target Date 05/28/21      PT SHORT TERM GOAL #2   Title Patient will report at least 25% improvement in symptoms for improved quality of life.    Time 3    Period Weeks    Status On-going    Target Date 05/28/21               PT Long Term Goals - 05/08/21 1539       PT LONG TERM GOAL #1   Title Patient will report at least 75% improvement in symptoms for improved quality of life.    Time 6    Period Weeks    Status On-going    Target Date 06/18/21      PT LONG TERM GOAL #2   Title Patient will improve FOTO score by at least 10 points in order to indicate improved tolerance to activity.    Time 6    Period Weeks    Status On-going    Target Date 06/18/21      PT LONG TERM GOAL #3   Title Patient will be able to complete 5x STS in under 11.4 seconds in order to reduce the risk of falls.    Time 6    Period Weeks    Status On-going    Target Date 06/18/21      PT LONG TERM GOAL #4   Title Patient will be able to ambulate at least 226 feet in 2MWT in order to demonstrate improved gait speed for community ambulation.    Time 6    Period Weeks    Status On-going    Target Date 06/18/21                   Plan - 05/20/21 1446     Clinical Impression Statement Began session with previously completed stretches and exercises. Began additional hip and core strengthening. Increased reps as able for improving endurance and activity tolerance. Intermittent rest breaks for fatigue and SOB. She is moderately fatigued at end of session. Ended session with nu step for endurance and activity tolerance. Patient will continue to benefit from skilled physical therapy in order to reduce impairment and improve function.    Personal Factors and Comorbidities Comorbidity 3+;Fitness;Age;Time since  onset of injury/illness/exacerbation    Comorbidities  Arthritis, Back pain, BMI over 30, COPD, High Blood Pressure    Examination-Activity Limitations Locomotion Level;Transfers;Bend;Lift;Stand;Stairs;Squat    Examination-Participation Restrictions Meal Prep;Cleaning;Community Activity;Shop;Volunteer;[facility] Leaver [facility]    Stability/Clinical Decision Making Stable/Uncomplicated    Rehab Potential Fair    PT Frequency 2x / week    PT Duration 6 weeks    PT Treatment/Interventions ADLs/Self Care Home Management;Aquatic Therapy;Electrical Stimulation;Moist Heat;Traction;DME Instruction;Gait training;Stair training;Functional mobility training;Therapeutic activities;Therapeutic exercise;Balance training;Patient/family education;Neuromuscular re-education;Manual techniques;Dry needling;Energy conservation    PT Next Visit Plan core, hip, and postural strength.  continue to work on breathing technques.    PT Home Exercise Plan trunk flexion stretch in seated, March  1/19:  diaphragmatic breathing, abdominal isometrics, glute isometrics, bridging 1/27 heel raise, sidestepping at counter, ab march SLR    Consulted and Agree with Plan of Care Patient             Patient will benefit from skilled therapeutic intervention in order to improve the following deficits and impairments:  Abnormal gait, Difficulty walking, Decreased range of motion, Decreased endurance, Decreased activity tolerance, Pain, Decreased balance, Improper body mechanics, Impaired flexibility, Decreased mobility, Decreased strength, Postural dysfunction  Visit Diagnosis: Low back pain, unspecified back pain laterality, unspecified chronicity, unspecified whether sciatica present  Muscle weakness (generalized)  Other abnormalities of gait and mobility  Other symptoms and signs involving the musculoskeletal system     Problem List Patient Active Problem List   Diagnosis Date Noted   Essential (primary) hypertension    Hyperlipidemia, unspecified    Presence of cardiac pacemaker     Primary pulmonary hypertension ([place])    Unspecified osteoarthritis, unspecified site    Anxiety disorder, unspecified    Atherosclerotic heart disease of native coronary artery without angina pectoris    Chronic atrial fibrillation (HCC)    Chronic diastolic (congestive) heart failure ([place])    Chronic obstructive pulmonary disease, unspecified ([place])    Headache    Heart failure, unspecified ([place])    Unspecified fall, initial encounter    Degenerative disc disease, cervical 02/09/2018   Acute respiratory failure with hypoxia ([place]) 05/07/2017   Chronic kidney disease, stage III (moderate) ([place]) 05/07/2017   Chronic obstructive asthma with acute exacerbation ([place]) 05/05/2017   COPD exacerbation ([place]) 05/04/2017   Pacemaker complications 63/78/5885   CAP (community acquired pneumonia) 07/19/2013   Shortness of breath 07/19/2013   Chronic diastolic heart failure ([place]) 04/11/2013   Aortic stenosis 10/18/2012   Encounter for long-term (current) use of anticoagulants 10/08/2011   Mixed hyperlipidemia 08/25/2008   Essential hypertension, benign 08/25/2008   Coronary atherosclerosis of native coronary artery 08/25/2008   VENTRICULAR TACHYCARDIA 08/25/2008   Automatic implantable cardioverter-defibrillator in situ 08/25/2008    3:17 PM, 05/20/21 Mearl Latin PT, DPT Physical Therapist at [place] [address] [place], [place], [address] Phone: [phone]   Fax:  [phone]  Name: [name] [name] MRN: [id number] Date of Birth: June 11, 1931

## 2021-05-21 ENCOUNTER — Ambulatory Visit (HOSPITAL_COMMUNITY): Payer: Medicare PPO | Attending: Orthopedic Surgery

## 2021-05-21 ENCOUNTER — Encounter (HOSPITAL_COMMUNITY): Payer: Self-pay

## 2021-05-21 DIAGNOSIS — M545 Low back pain, unspecified: Secondary | ICD-10-CM | POA: Diagnosis not present

## 2021-05-21 DIAGNOSIS — M6281 Muscle weakness (generalized): Secondary | ICD-10-CM | POA: Diagnosis not present

## 2021-05-21 DIAGNOSIS — R29898 Other symptoms and signs involving the musculoskeletal system: Secondary | ICD-10-CM | POA: Diagnosis not present

## 2021-05-21 DIAGNOSIS — R2689 Other abnormalities of gait and mobility: Secondary | ICD-10-CM | POA: Insufficient documentation

## 2021-05-21 NOTE — Therapy (Signed)
[place], [place], [address] Phone: [phone]   Fax:  [phone]  Physical Therapy Treatment  Patient Details  Name: [name] [name] MRN: [id number] Date of Birth: 05-Dec-1931 Referring Provider (PT): Arther Abbott MD   Encounter Date: 05/21/2021   PT End of Session - 05/21/21 1529     Visit Number 6    Number of Visits 12    Date for PT Re-Evaluation 06/18/21    Authorization Type [employer]    Authorization Time Period 12 visits approved 1/18-06/18/21    Authorization - Number of Visits 6    Progress Note Due on Visit 10    PT Start Time 1448    PT Stop Time 1530    PT Time Calculation (min) 42 min    Activity Tolerance Patient tolerated treatment well;Patient limited by fatigue    Behavior During Therapy [facility] for tasks assessed/performed             Past Medical History:  Diagnosis Date   Anxiety    Chronic atrial fibrillation (HCC)    Chronic back pain    Chronic diastolic heart failure ([place])    Chronic obstructive pulmonary disease, unspecified (HCC)    Complete heart block (HCC)    COPD (chronic obstructive pulmonary disease) ([place])    Coronary atherosclerosis of native coronary artery    Multivessel status post CABG, DES PLA March 2006   DDD (degenerative disc disease), lumbar    Essential hypertension    Headache    ICD (implantable cardioverter-defibrillator) in place    Mixed hyperlipidemia    Osteoarthritis    Ventricular fibrillation ([place]) 2003   a. seen on PPM interrogation a/w syncope    Past Surgical History:  Procedure Laterality Date   CARDIAC DEFIBRILLATOR PLACEMENT     MDT dual chamber ICD   CARDIOVERSION N/A 08/09/2014   Procedure: CARDIOVERSION;  Surgeon: Sanda Klein, MD;  Location: [facility];  Service: Cardiovascular;  Laterality: N/A;   CORONARY ARTERY BYPASS GRAFT     LIMA to LAD, SVG to diagonal, SVG to ramus and OM   IMPLANTABLE CARDIOVERTER  DEFIBRILLATOR GENERATOR CHANGE N/A 09/25/2013   Procedure: IMPLANTABLE CARDIOVERTER DEFIBRILLATOR GENERATOR CHANGE;  Surgeon: Evans Lance, MD;  Location: [facility] CATH LAB;  Service: Cardiovascular;  Laterality: N/A;   LEAD REVISION N/A 09/29/2013   Procedure: LEAD REVISION;  Surgeon: Deboraha Sprang, MD;  Location: [facility] CATH LAB;  Service: Cardiovascular;  Laterality: N/A;   TONSILLECTOMY     YAG LASER APPLICATION Left 11/18/8561   Procedure: YAG LASER APPLICATION;  Surgeon: Elta Guadeloupe T. Gershon Crane, MD;  Location: [facility];  Service: Ophthalmology;  Laterality: Left;   YAG LASER APPLICATION Right 1/49/7026   Procedure: YAG LASER APPLICATION;  Surgeon: Elta Guadeloupe T. Gershon Crane, MD;  Location: [facility];  Service: Ophthalmology;  Laterality: Right;    There were no vitals filed for this visit.   Subjective Assessment - 05/21/21 1458     Subjective Pt stated she feels about the same, has been working on HEP a home some, admits needs to do more.  Increased difficulty breathing today.    Pertinent History COPD    How long can you stand comfortably? not long, increased time wiht UE support    Patient Stated Goals to be able to be more mobile    Currently in Pain? No/denies                [facility] PT Assessment -  05/21/21 0001       Assessment   Medical Diagnosis Spinal Stenosis of Lumbar Region    Referring Provider (PT) Arther Abbott MD    Onset Date/Surgical Date 05/07/18    Next MD Visit 05/22/21      Observation/Other Assessments   Focus on Therapeutic Outcomes (FOTO)  43.6      AROM   Lumbar Flexion 0% limited    Lumbar Extension 10% limited   was 25% limited   Lumbar - Right Side Bend WNL    Lumbar - Left Side Bend WNL      Strength   Right Hip Flexion 4/5    Right Hip Extension 3-/5   MMT complete in prone   Right Hip ABduction 3/5    Left Hip Flexion 4+/5    Left Hip Extension 3/5   MMT complete in prone   Left Hip ABduction 3+/5    Right Knee Flexion 4/5   MMT complete in prone   Left Knee  Flexion 4/5   MMT complete in prone     Transfers   Five time sit to stand comments  9.56 no HHA   12.19 seconds with intermittent UE support     Ambulation/Gait   Ambulation Distance (Feet) 192 Feet    Assistive device None    Gait Pattern Trunk flexed    Gait Comments 2MWT                           [facility] Adult PT Treatment/Exercise - 05/21/21 0001       Lumbar Exercises: Standing   Row 10 reps;Theraband    Theraband Level (Row) Level 2 (Red)    Shoulder Extension 10 reps;Theraband    Theraband Level (Shoulder Extension) Level 2 (Red)    Other Standing Lumbar Exercises 3D hip excursion 10x      Lumbar Exercises: Seated   Sit to Stand 10 reps;5 reps    Sit to Stand Limitations 2 sets                       PT Short Term Goals - 05/21/21 1502       PT SHORT TERM GOAL #1   Title Patient will be independent with HEP in order to improve functional outcomes.    Baseline 05/21/21:  Reports compliance with HEP daily    Status On-going      PT SHORT TERM GOAL #2   Title Patient will report at least 25% improvement in symptoms for improved quality of life.    Baseline 05/21/21:  Difficult to answer, pt reports her goals are to imrpove activity tolerance and ability to complete ADLs with increased ease    Status On-going               PT Long Term Goals - 05/08/21 1539       PT LONG TERM GOAL #1   Title Patient will report at least 75% improvement in symptoms for improved quality of life.    Time 6    Period Weeks    Status On-going    Target Date 06/18/21      PT LONG TERM GOAL #2   Title Patient will improve FOTO score by at least 10 points in order to indicate improved tolerance to activity.    Time 6    Period Weeks    Status On-going    Target Date 06/18/21  PT LONG TERM GOAL #3   Title Patient will be able to complete 5x STS in under 11.4 seconds in order to reduce the risk of falls.    Time 6    Period Weeks    Status  On-going    Target Date 06/18/21      PT LONG TERM GOAL #4   Title Patient will be able to ambulate at least 226 feet in 2MWT in order to demonstrate improved gait speed for community ambulation.    Time 6    Period Weeks    Status On-going    Target Date 06/18/21                   Plan - 05/21/21 1546     Clinical Impression Statement Reviewed goals prior MD apt scheduled for tomorrow with the following objective findings:  Pt admits to not completeing HEP daily though reports she does some of the exercises.  Pt unable to reports a % improvements as feels she continues to have difficulty with activity tolerance and completing HEP.  Objective testing shows ability to complete 5STS without HHA faster time and ability to ambulate increased cadence during 2MWT.  Pt does continues to be limited SOB, required seated breaks through session.  Added postural strengtheing with theraband this session and added bridges to HEP following MMT with significant weakness noted.    Personal Factors and Comorbidities Comorbidity 3+;Fitness;Age;Time since onset of injury/illness/exacerbation    Comorbidities Arthritis, Back pain, BMI over 30, COPD, High Blood Pressure    Examination-Activity Limitations Locomotion Level;Transfers;Bend;Lift;Stand;Stairs;Squat    Examination-Participation Restrictions Meal Prep;Cleaning;Community Activity;Shop;Volunteer;[facility]aver [facility]    Stability/Clinical Decision Making Stable/Uncomplicated    Clinical Decision Making Moderate    Rehab Potential Fair    PT Frequency 2x / week    PT Duration 6 weeks    PT Treatment/Interventions ADLs/Self Care Home Management;Aquatic Therapy;Electrical Stimulation;Moist Heat;Traction;DME Instruction;Gait training;Stair training;Functional mobility training;Therapeutic activities;Therapeutic exercise;Balance training;Patient/family education;Neuromuscular re-education;Manual techniques;Dry needling;Energy conservation    PT Next Visit  Plan core, hip, and postural strength.  continue to work on breathing technques.    PT Home Exercise Plan trunk flexion stretch in seated, March  1/19:  diaphragmatic breathing, abdominal isometrics, glute isometrics, bridging 1/27 heel raise, sidestepping at counter, ab march SLR; 2/1: bridge    Consulted and Agree with Plan of Care Patient             Patient will benefit from skilled therapeutic intervention in order to improve the following deficits and impairments:  Abnormal gait, Difficulty walking, Decreased range of motion, Decreased endurance, Decreased activity tolerance, Pain, Decreased balance, Improper body mechanics, Impaired flexibility, Decreased mobility, Decreased strength, Postural dysfunction  Visit Diagnosis: Low back pain, unspecified back pain laterality, unspecified chronicity, unspecified whether sciatica present  Muscle weakness (generalized)  Other abnormalities of gait and mobility  Other symptoms and signs involving the musculoskeletal system     Problem List Patient Active Problem List   Diagnosis Date Noted   Essential (primary) hypertension    Hyperlipidemia, unspecified    Presence of cardiac pacemaker    Primary pulmonary hypertension ([place])    Unspecified osteoarthritis, unspecified site    Anxiety disorder, unspecified    Atherosclerotic heart disease of native coronary artery without angina pectoris    Chronic atrial fibrillation (HCC)    Chronic diastolic (congestive) heart failure (HCC)    Chronic obstructive pulmonary disease, unspecified ([place])    Headache    Heart failure, unspecified ([place])  Unspecified fall, initial encounter    Degenerative disc disease, cervical 02/09/2018   Acute respiratory failure with hypoxia ([place]) 05/07/2017   Chronic kidney disease, stage III (moderate) ([place]) 05/07/2017   Chronic obstructive asthma with acute exacerbation ([place]) 05/05/2017   COPD exacerbation ([place]) 05/04/2017   Pacemaker complications  22/97/9892   CAP (community acquired pneumonia) 07/19/2013   Shortness of breath 07/19/2013   Chronic diastolic heart failure ([place]) 04/11/2013   Aortic stenosis 10/18/2012   Encounter for long-term (current) use of anticoagulants 10/08/2011   Mixed hyperlipidemia 08/25/2008   Essential hypertension, benign 08/25/2008   Coronary atherosclerosis of native coronary artery 08/25/2008   VENTRICULAR TACHYCARDIA 08/25/2008   Automatic implantable cardioverter-defibrillator in situ 08/25/2008   Ihor Austin, LPTA/CLT; CBIS [phone]   Aldona Lento, PTA 05/21/2021, 4:14 PM  [place], [place], [address] Phone: [phone]   Fax:  [phone]  Name: [name] [name] MRN: [id number] Date of Birth: [birth year]

## 2021-05-22 ENCOUNTER — Ambulatory Visit: Payer: Medicare PPO | Admitting: Orthopedic Surgery

## 2021-05-22 ENCOUNTER — Other Ambulatory Visit: Payer: Self-pay

## 2021-05-22 DIAGNOSIS — M48061 Spinal stenosis, lumbar region without neurogenic claudication: Secondary | ICD-10-CM | POA: Diagnosis not present

## 2021-05-22 NOTE — Progress Notes (Signed)
Chief Complaint  Patient presents with   Back Pain    LBP   She didn't get the nortriptyline  Read the side effects and declined    She went to 5 visits and thinks its helped   Rec she continue    Continue PT   Return 8 weeks

## 2021-05-26 ENCOUNTER — Ambulatory Visit (INDEPENDENT_AMBULATORY_CARE_PROVIDER_SITE_OTHER): Payer: Medicare PPO

## 2021-05-26 DIAGNOSIS — I4729 Other ventricular tachycardia: Secondary | ICD-10-CM

## 2021-05-27 ENCOUNTER — Ambulatory Visit: Payer: Medicare PPO | Admitting: Cardiology

## 2021-05-27 ENCOUNTER — Ambulatory Visit (HOSPITAL_COMMUNITY): Payer: Medicare PPO

## 2021-05-27 ENCOUNTER — Other Ambulatory Visit: Payer: Self-pay

## 2021-05-27 ENCOUNTER — Encounter (HOSPITAL_COMMUNITY): Payer: Self-pay

## 2021-05-27 DIAGNOSIS — R2689 Other abnormalities of gait and mobility: Secondary | ICD-10-CM | POA: Diagnosis not present

## 2021-05-27 DIAGNOSIS — M6281 Muscle weakness (generalized): Secondary | ICD-10-CM | POA: Diagnosis not present

## 2021-05-27 DIAGNOSIS — M545 Low back pain, unspecified: Secondary | ICD-10-CM | POA: Diagnosis not present

## 2021-05-27 DIAGNOSIS — R29898 Other symptoms and signs involving the musculoskeletal system: Secondary | ICD-10-CM

## 2021-05-27 LAB — CUP PACEART REMOTE DEVICE CHECK
Battery Remaining Longevity: 1 mo
Battery Voltage: 2.75 V
Brady Statistic AP VP Percent: 97.1 %
Brady Statistic AP VS Percent: 0.01 %
Brady Statistic AS VP Percent: 2.89 %
Brady Statistic AS VS Percent: 0 %
Brady Statistic RA Percent Paced: 96.55 %
Brady Statistic RV Percent Paced: 99.82 %
Date Time Interrogation Session: 20230206053329
HighPow Impedance: 58 Ohm
HighPow Impedance: 77 Ohm
Implantable Lead Implant Date: 20030823
Implantable Lead Implant Date: 20030823
Implantable Lead Location: 753859
Implantable Lead Location: 753860
Implantable Lead Model: 157
Implantable Lead Model: 4086
Implantable Lead Serial Number: 108215
Implantable Lead Serial Number: 112190
Implantable Pulse Generator Implant Date: 20150608
Lead Channel Impedance Value: 342 Ohm
Lead Channel Impedance Value: 361 Ohm
Lead Channel Impedance Value: 418 Ohm
Lead Channel Pacing Threshold Amplitude: 0.625 V
Lead Channel Pacing Threshold Amplitude: 1.25 V
Lead Channel Pacing Threshold Pulse Width: 0.4 ms
Lead Channel Pacing Threshold Pulse Width: 0.4 ms
Lead Channel Sensing Intrinsic Amplitude: 1 mV
Lead Channel Sensing Intrinsic Amplitude: 1 mV
Lead Channel Sensing Intrinsic Amplitude: 11.75 mV
Lead Channel Sensing Intrinsic Amplitude: 11.75 mV
Lead Channel Setting Pacing Amplitude: 2 V
Lead Channel Setting Pacing Amplitude: 2.75 V
Lead Channel Setting Pacing Pulse Width: 0.4 ms
Lead Channel Setting Sensing Sensitivity: 0.3 mV

## 2021-05-27 NOTE — Therapy (Signed)
[place], [place], [address] Phone: [phone]   Fax:  [phone]  Physical Therapy Treatment  Patient Details  Name: [name] [name] MRN: [id number] Date of Birth: Nov 27, 1931 Referring Provider (PT): Arther Abbott MD   Encounter Date: 05/27/2021   PT End of Session - 05/27/21 1441     Visit Number 7    Number of Visits 12    Date for PT Re-Evaluation 06/18/21    Authorization Type [employer]    Authorization Time Period 12 visits approved 1/18-06/18/21    Authorization - Number of Visits 6    Progress Note Due on Visit 10    PT Start Time 1436    PT Stop Time 1448    PT Time Calculation (min) 41 min    Activity Tolerance Patient tolerated treatment well;Patient limited by fatigue    Behavior During Therapy [facility] for tasks assessed/performed             Past Medical History:  Diagnosis Date   Anxiety    Chronic atrial fibrillation (HCC)    Chronic back pain    Chronic diastolic heart failure ([place])    Chronic obstructive pulmonary disease, unspecified (HCC)    Complete heart block (HCC)    COPD (chronic obstructive pulmonary disease) ([place])    Coronary atherosclerosis of native coronary artery    Multivessel status post CABG, DES PLA March 2006   DDD (degenerative disc disease), lumbar    Essential hypertension    Headache    ICD (implantable cardioverter-defibrillator) in place    Mixed hyperlipidemia    Osteoarthritis    Ventricular fibrillation ([place]) 2003   a. seen on PPM interrogation a/w syncope    Past Surgical History:  Procedure Laterality Date   CARDIAC DEFIBRILLATOR PLACEMENT     MDT dual chamber ICD   CARDIOVERSION N/A 08/09/2014   Procedure: CARDIOVERSION;  Surgeon: Sanda Klein, MD;  [facility]: [facility];  Service: Cardiovascular;  Laterality: N/A;   CORONARY ARTERY BYPASS GRAFT     LIMA to LAD, SVG to diagonal, SVG to ramus and OM   IMPLANTABLE CARDIOVERTER  DEFIBRILLATOR GENERATOR CHANGE N/A 09/25/2013   Procedure: IMPLANTABLE CARDIOVERTER DEFIBRILLATOR GENERATOR CHANGE;  Surgeon: Evans Lance, MD;  Location: [facility] CATH LAB;  Service: Cardiovascular;  Laterality: N/A;   LEAD REVISION N/A 09/29/2013   Procedure: LEAD REVISION;  Surgeon: Deboraha Sprang, MD;  Location: [facility] CATH LAB;  Service: Cardiovascular;  Laterality: N/A;   TONSILLECTOMY     YAG LASER APPLICATION Left 04/27/5629   Procedure: YAG LASER APPLICATION;  Surgeon: Elta Guadeloupe T. Gershon Crane, MD;  Location: [facility];  Service: Ophthalmology;  Laterality: Left;   YAG LASER APPLICATION Right 4/97/0263   Procedure: YAG LASER APPLICATION;  Surgeon: Elta Guadeloupe T. Gershon Crane, MD;  Location: [facility];  Service: Ophthalmology;  Laterality: Right;    There were no vitals filed for this visit.   Subjective Assessment - 05/27/21 1440     Subjective Pt denies any pain upon arriving for today's treamtent, but endorses increased difficulty breathing today.    Pertinent History COPD    How long can you stand comfortably? not long, increased time wiht UE support    Patient Stated Goals to be able to be more mobile    Currently in Pain? No/denies  [place] Adult PT Treatment/Exercise - 05/27/21 0001       Lumbar Exercises: Aerobic   Nustep at end of session 5 minutes with UE/LE Level 1 SPM between 50-56      Lumbar Exercises: Standing   Heel Raises 20 reps    Heel Raises Limitations Toe Raises 20 reps    Row Theraband;15 reps    Theraband Level (Row) Level 2 (Red)    Shoulder Extension Theraband;15 reps    Theraband Level (Shoulder Extension) Level 2 (Red)      Lumbar Exercises: Seated   Long Arc Quad on Chair Both;2 sets;10 reps    Sit to Stand 10 reps    Sit to Stand Limitations 2 sets    Other Seated Lumbar Exercises marching 15X alternating                       PT Short Term Goals - 05/21/21 1502       PT SHORT TERM GOAL #1   Title Patient will  be independent with HEP in order to improve functional outcomes.    Baseline 05/21/21:  Reports compliance with HEP daily    Status On-going      PT SHORT TERM GOAL #2   Title Patient will report at least 25% improvement in symptoms for improved quality of life.    Baseline 05/21/21:  Difficult to answer, pt reports her goals are to imrpove activity tolerance and ability to complete ADLs with increased ease    Status On-going               PT Long Term Goals - 05/08/21 1539       PT LONG TERM GOAL #1   Title Patient will report at least 75% improvement in symptoms for improved quality of life.    Time 6    Period Weeks    Status On-going    Target Date 06/18/21      PT LONG TERM GOAL #2   Title Patient will improve FOTO score by at least 10 points in order to indicate improved tolerance to activity.    Time 6    Period Weeks    Status On-going    Target Date 06/18/21      PT LONG TERM GOAL #3   Title Patient will be able to complete 5x STS in under 11.4 seconds in order to reduce the risk of falls.    Time 6    Period Weeks    Status On-going    Target Date 06/18/21      PT LONG TERM GOAL #4   Title Patient will be able to ambulate at least 226 feet in 2MWT in order to demonstrate improved gait speed for community ambulation.    Time 6    Period Weeks    Status On-going    Target Date 06/18/21                   Plan - 05/27/21 1519     Clinical Impression Statement Pt arrives for today's treatment session denying any pain, but does report increase difficulty with breathing.  Pt requiring numerous seated rest breaks due to [facility].  Pt requiring min cues for proper technique with most exercises.  Pt encouraged to continue performing HEP as previously instructed.  Pt educated on the importance of complaince with and performance of HEP.             Patient will benefit from  skilled therapeutic intervention in order to improve the following deficits and  impairments:  Abnormal gait, Difficulty walking, Decreased range of motion, Decreased endurance, Decreased activity tolerance, Pain, Decreased balance, Improper body mechanics, Impaired flexibility, Decreased mobility, Decreased strength, Postural dysfunction  Visit Diagnosis: Low back pain, unspecified back pain laterality, unspecified chronicity, unspecified whether sciatica present  Muscle weakness (generalized)  Other abnormalities of gait and mobility  Other symptoms and signs involving the musculoskeletal system     Problem List Patient Active Problem List   Diagnosis Date Noted   Essential (primary) hypertension    Hyperlipidemia, unspecified    Presence of cardiac pacemaker    Primary pulmonary hypertension ([place])    Unspecified osteoarthritis, unspecified site    Anxiety disorder, unspecified    Atherosclerotic heart disease of native coronary artery without angina pectoris    Chronic atrial fibrillation (HCC)    Chronic diastolic (congestive) heart failure (HCC)    Chronic obstructive pulmonary disease, unspecified (HCC)    Headache    Heart failure, unspecified ([place])    Unspecified fall, initial encounter    Degenerative disc disease, cervical 02/09/2018   Acute respiratory failure with hypoxia ([place]) 05/07/2017   Chronic kidney disease, stage III (moderate) (HCC) 05/07/2017   Chronic obstructive asthma with acute exacerbation ([place]) 05/05/2017   COPD exacerbation ([place]) 05/04/2017   Pacemaker complications 91/63/8466   CAP (community acquired pneumonia) 07/19/2013   Shortness of breath 07/19/2013   Chronic diastolic heart failure ([place]) 04/11/2013   Aortic stenosis 10/18/2012   Encounter for long-term (current) use of anticoagulants 10/08/2011   Mixed hyperlipidemia 08/25/2008   Essential hypertension, benign 08/25/2008   Coronary atherosclerosis of native coronary artery 08/25/2008   VENTRICULAR TACHYCARDIA 08/25/2008   Automatic implantable  cardioverter-defibrillator in situ 08/25/2008    Kathrynn Ducking, PTA 05/27/2021, 3:22 PM  [place], [place], [address] Phone: [phone]   Fax:  [phone]  Name: SHARRON PETRUSKA MRN: [id number] Date of Birth: [birth year]

## 2021-05-29 ENCOUNTER — Other Ambulatory Visit: Payer: Self-pay

## 2021-05-29 ENCOUNTER — Encounter (HOSPITAL_COMMUNITY): Payer: Self-pay | Admitting: Physical Therapy

## 2021-05-29 ENCOUNTER — Ambulatory Visit (HOSPITAL_COMMUNITY): Payer: Medicare PPO | Admitting: Physical Therapy

## 2021-05-29 DIAGNOSIS — R29898 Other symptoms and signs involving the musculoskeletal system: Secondary | ICD-10-CM

## 2021-05-29 DIAGNOSIS — R2689 Other abnormalities of gait and mobility: Secondary | ICD-10-CM

## 2021-05-29 DIAGNOSIS — M545 Low back pain, unspecified: Secondary | ICD-10-CM | POA: Diagnosis not present

## 2021-05-29 DIAGNOSIS — M6281 Muscle weakness (generalized): Secondary | ICD-10-CM

## 2021-05-29 NOTE — Progress Notes (Signed)
Remote pacemaker transmission.   

## 2021-05-29 NOTE — Therapy (Signed)
[place], [place], [address] Phone: [phone]   Fax:  [phone]  Physical Therapy Treatment  Patient Details  Name: [name] [name] MRN: [id number] Date of Birth: [birth year] Referring Provider (PT): Arther Abbott MD   Encounter Date: 05/29/2021   PT End of Session - 05/29/21 1449     Visit Number 8    Number of Visits 12    Date for PT Re-Evaluation 06/18/21    Authorization Type [employer]    Authorization Time Period 12 visits approved 1/18-06/18/21    Authorization - Number of Visits 8    Progress Note Due on Visit 10    PT Start Time 1448    PT Stop Time 1526    PT Time Calculation (min) 38 min    Activity Tolerance Patient tolerated treatment well;Patient limited by fatigue    Behavior During Therapy [facility] for tasks assessed/performed             Past Medical History:  Diagnosis Date   Anxiety    Chronic atrial fibrillation (HCC)    Chronic back pain    Chronic diastolic heart failure ([place])    Chronic obstructive pulmonary disease, unspecified (HCC)    Complete heart block (HCC)    COPD (chronic obstructive pulmonary disease) ([place])    Coronary atherosclerosis of native coronary artery    Multivessel status post CABG, DES PLA March 2006   DDD (degenerative disc disease), lumbar    Essential hypertension    Headache    ICD (implantable cardioverter-defibrillator) in place    Mixed hyperlipidemia    Osteoarthritis    Ventricular fibrillation ([place]) 2003   a. seen on PPM interrogation a/w syncope    Past Surgical History:  Procedure Laterality Date   CARDIAC DEFIBRILLATOR PLACEMENT     MDT dual chamber ICD   CARDIOVERSION N/A 08/09/2014   Procedure: CARDIOVERSION;  Surgeon: Sanda Klein, MD;  [facility]: [facility];  Service: Cardiovascular;  Laterality: N/A;   CORONARY ARTERY BYPASS GRAFT     LIMA to LAD, SVG to diagonal, SVG to ramus and OM   IMPLANTABLE CARDIOVERTER  DEFIBRILLATOR GENERATOR CHANGE N/A 09/25/2013   Procedure: IMPLANTABLE CARDIOVERTER DEFIBRILLATOR GENERATOR CHANGE;  Surgeon: Evans Lance, MD;  Location: [facility] CATH LAB;  Service: Cardiovascular;  Laterality: N/A;   LEAD REVISION N/A 09/29/2013   Procedure: LEAD REVISION;  Surgeon: Deboraha Sprang, MD;  Location: [facility] CATH LAB;  Service: Cardiovascular;  Laterality: N/A;   TONSILLECTOMY     YAG LASER APPLICATION Left 05/27/9483   Procedure: YAG LASER APPLICATION;  Surgeon: Elta Guadeloupe T. Gershon Crane, MD;  Location: [facility];  Service: Ophthalmology;  Laterality: Left;   YAG LASER APPLICATION Right 4/62/7035   Procedure: YAG LASER APPLICATION;  Surgeon: Elta Guadeloupe T. Gershon Crane, MD;  Location: [facility];  Service: Ophthalmology;  Laterality: Right;    There were no vitals filed for this visit.   Subjective Assessment - 05/29/21 1450     Subjective Patient with increased SOB today. Always seems to be running late.    Pertinent History COPD    How long can you stand comfortably? not long, increased time wiht UE support    Patient Stated Goals to be able to be more mobile    Currently in Pain? No/denies                               [facility] Adult PT  Treatment/Exercise - 05/29/21 0001       Lumbar Exercises: Aerobic   Nustep 5 minutes with UE/LE Level 1 SPM between 50-60      Lumbar Exercises: Standing   Row Theraband;15 reps    Theraband Level (Row) Level 2 (Red)    Shoulder Extension Theraband;15 reps    Theraband Level (Shoulder Extension) Level 2 (Red)    Other Standing Lumbar Exercises palof 2x10 bilateral red band      Lumbar Exercises: Seated   Long Arc Quad on Chair Both;2 sets;10 reps    Sit to Stand 10 reps    Sit to Stand Limitations 2 sets                     PT Education - 05/29/21 1450     Education Details HEP    Person(s) Educated Patient    Methods Explanation;Demonstration    Comprehension Verbalized understanding;Returned demonstration              PT  Short Term Goals - 05/21/21 1502       PT SHORT TERM GOAL #1   Title Patient will be independent with HEP in order to improve functional outcomes.    Baseline 05/21/21:  Reports compliance with HEP daily    Status On-going      PT SHORT TERM GOAL #2   Title Patient will report at least 25% improvement in symptoms for improved quality of life.    Baseline 05/21/21:  Difficult to answer, pt reports her goals are to imrpove activity tolerance and ability to complete ADLs with increased ease    Status On-going               PT Long Term Goals - 05/08/21 1539       PT LONG TERM GOAL #1   Title Patient will report at least 75% improvement in symptoms for improved quality of life.    Time 6    Period Weeks    Status On-going    Target Date 06/18/21      PT LONG TERM GOAL #2   Title Patient will improve FOTO score by at least 10 points in order to indicate improved tolerance to activity.    Time 6    Period Weeks    Status On-going    Target Date 06/18/21      PT LONG TERM GOAL #3   Title Patient will be able to complete 5x STS in under 11.4 seconds in order to reduce the risk of falls.    Time 6    Period Weeks    Status On-going    Target Date 06/18/21      PT LONG TERM GOAL #4   Title Patient will be able to ambulate at least 226 feet in 2MWT in order to demonstrate improved gait speed for community ambulation.    Time 6    Period Weeks    Status On-going    Target Date 06/18/21                   Plan - 05/29/21 1450     Clinical Impression Statement Patient with increased fatigue and SOB today requiring frequent rest breaks to recover. Very fatigued with ambulation from waiting room to sink and to nu step requiring long rest break. Continued with resisted postural strengthening. Patient will continue to benefit from physical therapy in order to reduce impairment and improve function.    Personal Factors and  Comorbidities Comorbidity 3+;Fitness;Age;Time since  onset of injury/illness/exacerbation    Comorbidities Arthritis, Back pain, BMI over 30, COPD, High Blood Pressure    Examination-Activity Limitations Locomotion Level;Transfers;Bend;Lift;Stand;Stairs;Squat    Examination-Participation Restrictions Meal Prep;Cleaning;Community Activity;Shop;Volunteer;Valla Leaver [facility]    Stability/Clinical Decision Making Stable/Uncomplicated    Rehab Potential Fair    PT Frequency 2x / week    PT Duration 6 weeks    PT Treatment/Interventions ADLs/Self Care Home Management;Aquatic Therapy;Electrical Stimulation;Moist Heat;Traction;DME Instruction;Gait training;Stair training;Functional mobility training;Therapeutic activities;Therapeutic exercise;Balance training;Patient/family education;Neuromuscular re-education;Manual techniques;Dry needling;Energy conservation    PT Next Visit Plan core, hip, and postural strength.  continue to work on breathing technques.    PT Home Exercise Plan trunk flexion stretch in seated, March  1/19:  diaphragmatic breathing, abdominal isometrics, glute isometrics, bridging 1/27 heel raise, sidestepping at counter, ab march SLR; 2/1: bridge    Consulted and Agree with Plan of Care Patient             Patient will benefit from skilled therapeutic intervention in order to improve the following deficits and impairments:  Abnormal gait, Difficulty walking, Decreased range of motion, Decreased endurance, Decreased activity tolerance, Pain, Decreased balance, Improper body mechanics, Impaired flexibility, Decreased mobility, Decreased strength, Postural dysfunction  Visit Diagnosis: Low back pain, unspecified back pain laterality, unspecified chronicity, unspecified whether sciatica present  Muscle weakness (generalized)  Other abnormalities of gait and mobility  Other symptoms and signs involving the musculoskeletal system     Problem List Patient Active Problem List   Diagnosis Date Noted   Essential (primary)  hypertension    Hyperlipidemia, unspecified    Presence of cardiac pacemaker    Primary pulmonary hypertension ([place])    Unspecified osteoarthritis, unspecified site    Anxiety disorder, unspecified    Atherosclerotic heart disease of native coronary artery without angina pectoris    Chronic atrial fibrillation (HCC)    Chronic diastolic (congestive) heart failure (HCC)    Chronic obstructive pulmonary disease, unspecified ([place])    Headache    Heart failure, unspecified ([place])    Unspecified fall, initial encounter    Degenerative disc disease, cervical 02/09/2018   Acute respiratory failure with hypoxia ([place]) 05/07/2017   Chronic kidney disease, stage III (moderate) (HCC) 05/07/2017   Chronic obstructive asthma with acute exacerbation ([place]) 05/05/2017   COPD exacerbation ([place]) 05/04/2017   Pacemaker complications 67/59/1638   CAP (community acquired pneumonia) 07/19/2013   Shortness of breath 07/19/2013   Chronic diastolic heart failure ([place]) 04/11/2013   Aortic stenosis 10/18/2012   Encounter for long-term (current) use of anticoagulants 10/08/2011   Mixed hyperlipidemia 08/25/2008   Essential hypertension, benign 08/25/2008   Coronary atherosclerosis of native coronary artery 08/25/2008   VENTRICULAR TACHYCARDIA 08/25/2008   Automatic implantable cardioverter-defibrillator in situ 08/25/2008    3:22 PM, 05/29/21 Mearl Latin PT, DPT Physical Therapist at [place], [place], [address] Phone: [phone]   Fax:  [phone]  Name: [name] [name] MRN: [id number] Date of Birth: 16-Sep-1931

## 2021-05-29 NOTE — Addendum Note (Signed)
Addended by: Cheri Kearns A on: 05/29/2021 12:00 PM   Modules accepted: Level of Service

## 2021-06-03 ENCOUNTER — Encounter (HOSPITAL_COMMUNITY): Payer: Self-pay | Admitting: Physical Therapy

## 2021-06-03 ENCOUNTER — Other Ambulatory Visit: Payer: Self-pay

## 2021-06-03 ENCOUNTER — Ambulatory Visit (HOSPITAL_COMMUNITY): Payer: Medicare PPO | Admitting: Physical Therapy

## 2021-06-03 DIAGNOSIS — E785 Hyperlipidemia, unspecified: Secondary | ICD-10-CM | POA: Diagnosis not present

## 2021-06-03 DIAGNOSIS — R2689 Other abnormalities of gait and mobility: Secondary | ICD-10-CM

## 2021-06-03 DIAGNOSIS — M6281 Muscle weakness (generalized): Secondary | ICD-10-CM

## 2021-06-03 DIAGNOSIS — N1832 Chronic kidney disease, stage 3b: Secondary | ICD-10-CM | POA: Diagnosis not present

## 2021-06-03 DIAGNOSIS — R29898 Other symptoms and signs involving the musculoskeletal system: Secondary | ICD-10-CM

## 2021-06-03 DIAGNOSIS — I10 Essential (primary) hypertension: Secondary | ICD-10-CM | POA: Diagnosis not present

## 2021-06-03 DIAGNOSIS — M545 Low back pain, unspecified: Secondary | ICD-10-CM | POA: Diagnosis not present

## 2021-06-03 DIAGNOSIS — I2510 Atherosclerotic heart disease of native coronary artery without angina pectoris: Secondary | ICD-10-CM | POA: Diagnosis not present

## 2021-06-03 DIAGNOSIS — I5032 Chronic diastolic (congestive) heart failure: Secondary | ICD-10-CM | POA: Diagnosis not present

## 2021-06-03 DIAGNOSIS — Z79899 Other long term (current) drug therapy: Secondary | ICD-10-CM | POA: Diagnosis not present

## 2021-06-03 DIAGNOSIS — I700 Atherosclerosis of aorta: Secondary | ICD-10-CM | POA: Diagnosis not present

## 2021-06-03 NOTE — Therapy (Signed)
[place], [place], [address] Phone: [phone]   Fax:  [phone]  Physical Therapy Treatment  Patient Details  Name: [name] [name] MRN: [id number] Date of Birth: 10/03/1931 Referring Provider (PT): Arther Abbott MD   Encounter Date: 06/03/2021   PT End of Session - 06/03/21 1316     Visit Number 9    Number of Visits 12    Date for PT Re-Evaluation 06/18/21    Authorization Type [employer]    Authorization Time Period 12 visits approved 1/18-06/18/21    Authorization - Number of Visits 9    Progress Note Due on Visit 10    PT Start Time 1316    PT Stop Time 1355    PT Time Calculation (min) 39 min    Activity Tolerance Patient tolerated treatment well;Patient limited by fatigue    Behavior During Therapy [facility] for tasks assessed/performed             Past Medical History:  Diagnosis Date   Anxiety    Chronic atrial fibrillation (HCC)    Chronic back pain    Chronic diastolic heart failure ([place])    Chronic obstructive pulmonary disease, unspecified (HCC)    Complete heart block (HCC)    COPD (chronic obstructive pulmonary disease) ([place])    Coronary atherosclerosis of native coronary artery    Multivessel status post CABG, DES PLA March 2006   DDD (degenerative disc disease), lumbar    Essential hypertension    Headache    ICD (implantable cardioverter-defibrillator) in place    Mixed hyperlipidemia    Osteoarthritis    Ventricular fibrillation ([place]) 2003   a. seen on PPM interrogation a/w syncope    Past Surgical History:  Procedure Laterality Date   CARDIAC DEFIBRILLATOR PLACEMENT     MDT dual chamber ICD   CARDIOVERSION N/A 08/09/2014   Procedure: CARDIOVERSION;  Surgeon: Sanda Klein, MD;  Location: [facility];  Service: Cardiovascular;  Laterality: N/A;   CORONARY ARTERY BYPASS GRAFT     LIMA to LAD, SVG to diagonal, SVG to ramus and OM   IMPLANTABLE CARDIOVERTER  DEFIBRILLATOR GENERATOR CHANGE N/A 09/25/2013   Procedure: IMPLANTABLE CARDIOVERTER DEFIBRILLATOR GENERATOR CHANGE;  Surgeon: Evans Lance, MD;  Location: [facility] CATH LAB;  Service: Cardiovascular;  Laterality: N/A;   LEAD REVISION N/A 09/29/2013   Procedure: LEAD REVISION;  Surgeon: Deboraha Sprang, MD;  Location: [facility] CATH LAB;  Service: Cardiovascular;  Laterality: N/A;   TONSILLECTOMY     YAG LASER APPLICATION Left 08/26/1636   Procedure: YAG LASER APPLICATION;  Surgeon: Elta Guadeloupe T. Gershon Crane, MD;  Location: [facility] ORS;  Service: Ophthalmology;  Laterality: Left;   YAG LASER APPLICATION Right 4/66/5993   Procedure: YAG LASER APPLICATION;  Surgeon: Elta Guadeloupe T. Gershon Crane, MD;  Location: [facility] OR[facility];  Service: Ophthalmology;  Laterality: Right;    Vitals:   06/03/21 1342  SpO2: 92%     Subjective Assessment - 06/03/21 1315     Subjective Patient states today she is feeling more tired and out of breath, needing restroom at start of session. Overall states unclear if she feels change from PT as of yet however hopeful to see change as she continues.    Pertinent History COPD    How long can you stand comfortably? not long, increased time wiht UE support    Patient Stated Goals to be able to be more mobile    Currently in Pain? No/denies  [facility] [facility] Adult PT Treatment/Exercise - 06/03/21 0001       Ambulation/Gait   Ambulation Distance (Feet) 200 Feet    Assistive device None    Gait Pattern Trunk flexed      Lumbar Exercises: Standing   Other Standing Lumbar Exercises sidestepping in parallel bars with and without BUE support with cue for slight lumbar flexion during 6RT; 4inch step up in parallel bars alt B LE with B UE support x 10 reps x 2 sets; 4inch side step ups x 10 R then x 10 L with B UE support;      Lumbar Exercises: Seated   Other Seated Lumbar Exercises marching 15X alternating                     PT Education - 06/03/21 1316      Education Details hep    Person(s) Educated Patient    Methods Explanation;Demonstration    Comprehension Verbalized understanding;Returned demonstration              PT Short Term Goals - 05/21/21 1502       PT SHORT TERM GOAL #1   Title Patient will be independent with HEP in order to improve functional outcomes.    Baseline 05/21/21:  Reports compliance with HEP daily    Status On-going      PT SHORT TERM GOAL #2   Title Patient will report at least 25% improvement in symptoms for improved quality of life.    Baseline 05/21/21:  Difficult to answer, pt reports her goals are to imrpove activity tolerance and ability to complete ADLs with increased ease    Status On-going               PT Long Term Goals - 05/08/21 1539       PT LONG TERM GOAL #1   Title Patient will report at least 75% improvement in symptoms for improved quality of life.    Time 6    Period Weeks    Status On-going    Target Date 06/18/21      PT LONG TERM GOAL #2   Title Patient will improve FOTO score by at least 10 points in order to indicate improved tolerance to activity.    Time 6    Period Weeks    Status On-going    Target Date 06/18/21      PT LONG TERM GOAL #3   Title Patient will be able to complete 5x STS in under 11.4 seconds in order to reduce the risk of falls.    Time 6    Period Weeks    Status On-going    Target Date 06/18/21      PT LONG TERM GOAL #4   Title Patient will be able to ambulate at least 226 feet in 2MWT in order to demonstrate improved gait speed for community ambulation.    Time 6    Period Weeks    Status On-going    Target Date 06/18/21                   Plan - 06/03/21 1316     Clinical Impression Statement Patient demonstrating consistent SOB with SPO at 92-95% at rest and down to 87% at end of activities with consistent need for breaks that limits overall activity tolerance with pt report of 10/10 fatigue.  Demonstrating consistent need  for verbal cueing during activities however able to do without B UE support  when cued. Despite patient unclear of change and limited by fatigue today, overall change to be determined at progress note next session.  Patient is a good candidate for continued skilled physical therapy to progress toward goals and improve function to return to prior levels.    Personal Factors and Comorbidities Comorbidity 3+;Fitness;Age;Time since onset of injury/illness/exacerbation    Comorbidities Arthritis, Back pain, BMI over 30, COPD, High Blood Pressure    Examination-Activity Limitations Locomotion Level;Transfers;Bend;Lift;Stand;Stairs;Squat    Examination-Participation Restrictions Meal Prep;Cleaning;Community Activity;Shop;Volunteer;[facility]    Stability/Clinical Decision Making Stable/Uncomplicated    Rehab Potential Fair    PT Frequency 2x / week    PT Duration 6 weeks    PT Treatment/Interventions ADLs/Self Care Home Management;Aquatic Therapy;Electrical Stimulation;Moist Heat;Traction;DME Instruction;Gait training;Stair training;Functional mobility training;Therapeutic activities;Therapeutic exercise;Balance training;Patient/family education;Neuromuscular re-education;Manual techniques;Dry needling;Energy conservation    PT Next Visit Plan core, hip, and postural strength.  continue to work on breathing technques.; PN    PT Home Exercise Plan trunk flexion stretch in seated, March  1/19:  diaphragmatic breathing, abdominal isometrics, glute isometrics, bridging 1/27 heel raise, sidestepping at counter, ab march SLR; 2/1: bridge    Consulted and Agree with Plan of Care Patient             Patient will benefit from skilled therapeutic intervention in order to improve the following deficits and impairments:  Abnormal gait, Difficulty walking, Decreased range of motion, Decreased endurance, Decreased activity tolerance, Pain, Decreased balance, Improper body mechanics, Impaired flexibility,  Decreased mobility, Decreased strength, Postural dysfunction  Visit Diagnosis: Low back pain, unspecified back pain laterality, unspecified chronicity, unspecified whether sciatica present  Muscle weakness (generalized)  Other abnormalities of gait and mobility  Other symptoms and signs involving the musculoskeletal system     Problem List Patient Active Problem List   Diagnosis Date Noted   Essential (primary) hypertension    Hyperlipidemia, unspecified    Presence of cardiac pacemaker    Primary pulmonary hypertension ([place])    Unspecified osteoarthritis, unspecified site    Anxiety disorder, unspecified    Atherosclerotic heart disease of native coronary artery without angina pectoris    Chronic atrial fibrillation (HCC)    Chronic diastolic (congestive) heart failure (HCC)    Chronic obstructive pulmonary disease, unspecified ([place])    Headache    Heart failure, unspecified ([place])    Unspecified fall, initial encounter    Degenerative disc disease, cervical 02/09/2018   Acute respiratory failure with hypoxia ([place]) 05/07/2017   Chronic [name] disease, stage III (moderate) (HCC) 05/07/2017   Chronic obstructive asthma with acute exacerbation ([place]) 05/05/2017   COPD exacerbation ([place]) 05/04/2017   Pacemaker complications 62/94/7654   CAP (community acquired pneumonia) 07/19/2013   Shortness of breath 07/19/2013   Chronic diastolic heart failure ([place]) 04/11/2013   Aortic stenosis 10/18/2012   Encounter for long-term (current) use of anticoagulants 10/08/2011   Mixed hyperlipidemia 08/25/2008   Essential hypertension, benign 08/25/2008   Coronary atherosclerosis of native coronary artery 08/25/2008   VENTRICULAR TACHYCARDIA 08/25/2008   Automatic implantable cardioverter-defibrillator in situ 08/25/2008    1:55 PM, 06/03/21 Mearl Latin PT, DPT Physical Therapist at [place], [place], [address] Phone: [phone]   Fax:  [phone]  Name: [name] [name] MRN: [id number] Date of Birth: [birth year]

## 2021-06-05 ENCOUNTER — Ambulatory Visit (HOSPITAL_COMMUNITY): Payer: Medicare PPO | Admitting: Physical Therapy

## 2021-06-05 ENCOUNTER — Other Ambulatory Visit: Payer: Self-pay

## 2021-06-05 ENCOUNTER — Encounter (HOSPITAL_COMMUNITY): Payer: Self-pay | Admitting: Physical Therapy

## 2021-06-05 DIAGNOSIS — R29898 Other symptoms and signs involving the musculoskeletal system: Secondary | ICD-10-CM | POA: Diagnosis not present

## 2021-06-05 DIAGNOSIS — M6281 Muscle weakness (generalized): Secondary | ICD-10-CM | POA: Diagnosis not present

## 2021-06-05 DIAGNOSIS — R2689 Other abnormalities of gait and mobility: Secondary | ICD-10-CM

## 2021-06-05 DIAGNOSIS — M545 Low back pain, unspecified: Secondary | ICD-10-CM | POA: Diagnosis not present

## 2021-06-05 NOTE — Therapy (Addendum)
[place], [place], [address] Phone: [phone]   Fax:  [phone]  Physical Therapy Treatment/Progress Note  Patient Details  Name: [name] [name] MRN: [id number] Date of Birth: 02-13-1932 Referring Provider (PT): Arther Abbott MD   Encounter Date: 06/05/2021  Progress Note   Reporting Period 05/07/21 to 06/05/21   See note below for Objective Data and Assessment of Progress/Goals     PT End of Session - 06/05/21 1458     Visit Number 10    Number of Visits 12    Date for PT Re-Evaluation 06/18/21    Authorization Type [employer]    Authorization Time Period 12 visits approved 1/18-06/18/21    Authorization - Number of Visits 10    Progress Note Due on Visit 20    PT Start Time 1451    PT Stop Time 1529    PT Time Calculation (min) 38 min    Activity Tolerance Patient tolerated treatment well;Patient limited by fatigue    Behavior During Therapy [facility] for tasks assessed/performed             Past Medical History:  Diagnosis Date   Anxiety    Chronic atrial fibrillation (HCC)    Chronic back pain    Chronic diastolic heart failure ([place])    Chronic obstructive pulmonary disease, unspecified (HCC)    Complete heart block (HCC)    COPD (chronic obstructive pulmonary disease) ([place])    Coronary atherosclerosis of native coronary artery    Multivessel status post CABG, DES PLA March 2006   DDD (degenerative disc disease), lumbar    Essential hypertension    Headache    ICD (implantable cardioverter-defibrillator) in place    Mixed hyperlipidemia    Osteoarthritis    Ventricular fibrillation ([place]) 2003   a. seen on PPM interrogation a/w syncope    Past Surgical History:  Procedure Laterality Date   CARDIAC DEFIBRILLATOR PLACEMENT     MDT dual chamber ICD   CARDIOVERSION N/A 08/09/2014   Procedure: CARDIOVERSION;  Surgeon: Sanda Klein, MD;  Location: [facility];  Service:  Cardiovascular;  Laterality: N/A;   CORONARY ARTERY BYPASS GRAFT     LIMA to LAD, SVG to diagonal, SVG to ramus and OM   IMPLANTABLE CARDIOVERTER DEFIBRILLATOR GENERATOR CHANGE N/A 09/25/2013   Procedure: IMPLANTABLE CARDIOVERTER DEFIBRILLATOR GENERATOR CHANGE;  Surgeon: Evans Lance, MD;  Location: [facility] CATH LAB;  Service: Cardiovascular;  Laterality: N/A;   LEAD REVISION N/A 09/29/2013   Procedure: LEAD REVISION;  Surgeon: Deboraha Sprang, MD;  Location: [facility] CATH LAB;  Service: Cardiovascular;  Laterality: N/A;   TONSILLECTOMY     YAG LASER APPLICATION Left 04/22/2438   Procedure: YAG LASER APPLICATION;  Surgeon: Elta Guadeloupe T. Gershon Crane, MD;  Location: [facility]S;  Service: Ophthalmology;  Laterality: Left;   YAG LASER APPLICATION Right 04/22/7251   Procedure: YAG LASER APPLICATION;  Surgeon: Elta Guadeloupe T. Gershon Crane, MD;  Location: [facility];  Service: Ophthalmology;  Laterality: Right;    There were no vitals filed for this visit.   Subjective Assessment - 06/05/21 1449     Subjective Patient unclear if she has made any true change and wants to be hopeful that PT will help, reports intermittent use of HEP    Pertinent History COPD    How long can you stand comfortably? not long, increased time wiht UE support    Patient Stated Goals to be able to be more mobile  Currently in Pain? No/denies                [facility] PT Assessment - 06/05/21 0001       Assessment   Medical Diagnosis Spinal Stenosis of Lumbar Region    Referring Provider (PT) Arther Abbott MD    Onset Date/Surgical Date 05/07/18    Prior Therapy long time ago      Precautions   Precautions None      Restrictions   Weight Bearing Restrictions No      [place] residence    Living Arrangements Alone      Prior Function   Level of [place] Retired      Associate Professor   Overall Cognitive Status Within Functional Limits for tasks assessed      Observation/Other  Assessments   Observations Ambulates without AD, labored breathing    Focus on Therapeutic Outcomes (FOTO)  43% function      AROM   Lumbar Flexion 0% limited    Lumbar Extension 10% limited    Lumbar - Right Side Bend WNL    Lumbar - Left Side Bend WNL      Strength   Right Hip Flexion 4/5    Left Hip Flexion 4/5    Right Knee Flexion 4+/5    Right Knee Extension 5/5    Left Knee Flexion 4+/5    Left Knee Extension 5/5    Right Ankle Dorsiflexion 5/5    Left Ankle Dorsiflexion 5/5      Palpation   Palpation comment no tenderness      Transfers   Five time sit to stand comments  11.37 seconds with intermittent UE support      Ambulation/Gait   Ambulation Distance (Feet) 100 Feet    Assistive device None    Gait Pattern Trunk flexed    Gait Comments labored cadence without AD                           [facility] Adult PT Treatment/Exercise - 06/05/21 0001       Lumbar Exercises: Aerobic   Nustep 5 minutes with UE/LE Level 1 SPM between 50-60                     PT Education - 06/05/21 1449     Education Details HEP, POC in progress note today    Person(s) Educated Patient    Methods Explanation;Demonstration    Comprehension Verbalized understanding;Returned demonstration              PT Short Term Goals - 06/05/21 1519       PT SHORT TERM GOAL #1   Title Patient will be independent with HEP in order to improve functional outcomes.    Baseline 05/21/21:  Reports compliance with HEP daily; 06/05/21: reports intermittent compliance with HEP daily    Status On-going      PT SHORT TERM GOAL #2   Title Patient will report at least 25% improvement in symptoms for improved quality of life.    Baseline 05/21/21:  Difficult to answer, pt reports her goals are to imrpove activity tolerance and ability to complete ADLs with increased ease   06/05/21: Patient reports unsure if any change as concerned by breathing difficulty    Status On-going                PT  Long Term Goals - 06/05/21 1521       PT LONG TERM GOAL #1   Title Patient will report at least 75% improvement in symptoms for improved quality of life.    Baseline 06/05/21: unclear improvements, see short term goal unmet    Time 6    Period Weeks    Status On-going    Target Date 06/18/21      PT LONG TERM GOAL #2   Title Patient will improve FOTO score by at least 10 points in order to indicate improved tolerance to activity.    Baseline 06/05/21: FOTO decreased from 48% originally down to 43% today    Time 6    Period Weeks    Status On-going    Target Date 06/18/21      PT LONG TERM GOAL #3   Title Patient will be able to complete 5x STS in under 11.4 seconds in order to reduce the risk of falls.    Baseline 06/05/21: 5x STS done today at 11.37 seconds    Time 6    Period Weeks    Status Achieved    Target Date 06/18/21      PT LONG TERM GOAL #4   Title Patient will be able to ambulate at least 226 feet in 2MWT in order to demonstrate improved gait speed for community ambulation.    Baseline limited by SOB    Time 6    Period Weeks    Status On-going    Target Date 06/18/21                   Plan - 06/05/21 1450     Clinical Impression Statement Patient is a 88 y.o. female who originally presented to physical therapy with c/o LBP. She had presented with pain limited deficits in lumbar spine strength, ROM, endurance, postural impairments, activity tolerance, fatigue, and functional mobility with ADL. She is having to modify and restrict ADL as indicated by FOTO score as well as subjective information and objective measures which is affecting overall participation. Currently, the patient has been seen for 10 total session of physical therapy with overall minimal improvements in strength, ROM, and endurance and her FOTO does not yet show improvements.  She continues to be limited as before and limited in session by overall deconditioning especially  event in shortness of breath after activity.  At last session, SPO2 values post exercises dropped below 90% and is now of larger concern for both therapist and patient. During session today, focus was on progress evaluation and follow up discussion and education on next steps of POC.  It is recommended that the patient return to [place] to seek follow up care for her breathing difficulties while continuing with current physical therapy POC to progress function as able.    Personal Factors and Comorbidities Comorbidity 3+;Fitness;Age;Time since onset of injury/illness/exacerbation    Comorbidities Arthritis, Back pain, BMI over 30, COPD, High Blood Pressure    Examination-Activity Limitations Locomotion Level;Transfers;Bend;Lift;Stand;Stairs;Squat    Examination-Participation Restrictions Meal Prep;Cleaning;Community Activity;Shop;Volunteer;[facility]    Stability/Clinical Decision Making Stable/Uncomplicated    Rehab Potential Fair    PT Frequency 2x / week    PT Duration 6 weeks    PT Treatment/Interventions ADLs/Self Care Home Management;Aquatic Therapy;Electrical Stimulation;Moist Heat;Traction;DME Instruction;Gait training;Stair training;Functional mobility training;Therapeutic activities;Therapeutic exercise;Balance training;Patient/family education;Neuromuscular re-education;Manual techniques;Dry needling;Energy conservation    PT Next Visit Plan core, hip, and postural strength.  continue to work on breathing technques.; PN  PT Home Exercise Plan trunk flexion stretch in seated, March  1/19:  diaphragmatic breathing, abdominal isometrics, glute isometrics, bridging 1/27 heel raise, sidestepping at counter, ab march SLR; 2/1: bridge    Consulted and Agree with Plan of Care Patient             Patient will benefit from skilled therapeutic intervention in order to improve the following deficits and impairments:  Abnormal gait, Difficulty walking, Decreased range of motion, Decreased  endurance, Decreased activity tolerance, Pain, Decreased balance, Improper body mechanics, Impaired flexibility, Decreased mobility, Decreased strength, Postural dysfunction  Visit Diagnosis: Low back pain, unspecified back pain laterality, unspecified chronicity, unspecified whether sciatica present  Muscle weakness (generalized)  Other abnormalities of gait and mobility  Other symptoms and signs involving the musculoskeletal system     Problem List Patient Active Problem List   Diagnosis Date Noted   Essential (primary) hypertension    Hyperlipidemia, unspecified    Presence of cardiac pacemaker    Primary pulmonary hypertension ([place])    Unspecified osteoarthritis, unspecified site    Anxiety disorder, unspecified    Atherosclerotic heart disease of native coronary artery without angina pectoris    Chronic atrial fibrillation (HCC)    Chronic diastolic (congestive) heart failure (HCC)    Chronic obstructive pulmonary disease, unspecified ([place])    Headache    Heart failure, unspecified ([place])    Unspecified fall, initial encounter    Degenerative disc disease, cervical 02/09/2018   Acute respiratory failure with hypoxia ([place]) 05/07/2017   Chronic kidney disease, stage III (moderate) (HCC) 05/07/2017   Chronic obstructive asthma with acute exacerbation ([place]) 05/05/2017   COPD exacerbation ([place]) 05/04/2017   Pacemaker complications 70/78/6754   CAP (community acquired pneumonia) 07/19/2013   Shortness of breath 07/19/2013   Chronic diastolic heart failure ([place]) 04/11/2013   Aortic stenosis 10/18/2012   Encounter for long-term (current) use of anticoagulants 10/08/2011   Mixed hyperlipidemia 08/25/2008   Essential hypertension, benign 08/25/2008   Coronary atherosclerosis of native coronary artery 08/25/2008   VENTRICULAR TACHYCARDIA 08/25/2008   Automatic implantable cardioverter-defibrillator in situ 08/25/2008   Jerilynn Som, PT, DPT  Jamse Belfast, PT 06/05/2021,  3:30 PM  [place], [place], [address] Phone: [phone]   Fax:  [phone]  Name: [name] [name] MRN: [id number] Date of Birth: [birth year]

## 2021-06-06 ENCOUNTER — Other Ambulatory Visit: Payer: Self-pay | Admitting: Cardiology

## 2021-06-10 ENCOUNTER — Encounter (HOSPITAL_COMMUNITY): Payer: Medicare PPO | Admitting: Physical Therapy

## 2021-06-10 DIAGNOSIS — I10 Essential (primary) hypertension: Secondary | ICD-10-CM | POA: Diagnosis not present

## 2021-06-10 DIAGNOSIS — I480 Paroxysmal atrial fibrillation: Secondary | ICD-10-CM | POA: Diagnosis not present

## 2021-06-10 DIAGNOSIS — R0602 Shortness of breath: Secondary | ICD-10-CM | POA: Diagnosis not present

## 2021-06-10 DIAGNOSIS — I700 Atherosclerosis of aorta: Secondary | ICD-10-CM | POA: Diagnosis not present

## 2021-06-10 DIAGNOSIS — I5030 Unspecified diastolic (congestive) heart failure: Secondary | ICD-10-CM | POA: Diagnosis not present

## 2021-06-12 ENCOUNTER — Other Ambulatory Visit: Payer: Self-pay

## 2021-06-12 ENCOUNTER — Encounter (HOSPITAL_COMMUNITY): Payer: Self-pay | Admitting: Physical Therapy

## 2021-06-12 ENCOUNTER — Ambulatory Visit (HOSPITAL_COMMUNITY): Payer: Medicare PPO | Admitting: Physical Therapy

## 2021-06-12 DIAGNOSIS — R2689 Other abnormalities of gait and mobility: Secondary | ICD-10-CM | POA: Diagnosis not present

## 2021-06-12 DIAGNOSIS — R29898 Other symptoms and signs involving the musculoskeletal system: Secondary | ICD-10-CM | POA: Diagnosis not present

## 2021-06-12 DIAGNOSIS — M6281 Muscle weakness (generalized): Secondary | ICD-10-CM

## 2021-06-12 DIAGNOSIS — M545 Low back pain, unspecified: Secondary | ICD-10-CM | POA: Diagnosis not present

## 2021-06-12 NOTE — Patient Instructions (Signed)
Access Code: [email or web address] URL: [email or web address] Date: 06/12/2021 Prepared by: Mitzi Hansen Willodene Stallings  Exercises Standing Hip Abduction with Counter Support - 1 x daily - 7 x weekly - 2 sets - 10 reps Standing March with Counter Support - 1 x daily - 7 x weekly - 2 sets - 10 reps Standing Shoulder Row with Anchored Resistance - 1 x daily - 7 x weekly - 2 sets - 10 reps Shoulder extension with resistance - Neutral - 1 x daily - 7 x weekly - 2 sets - 10 reps Side Stepping with Counter Support - 1 x daily - 7 x weekly - 4 sets - 10 reps

## 2021-06-12 NOTE — Therapy (Signed)
[place], [place], [address] Phone: [phone]   Fax:  [phone]  Physical Therapy Treatment/Discharge Summary  Patient Details  Name: [name] [name] MRN: [id number] Date of Birth: July 19, 1931 Referring Provider (PT): Arther Abbott MD   Encounter Date: 06/12/2021  PHYSICAL THERAPY DISCHARGE SUMMARY  Visits from Start of Care: 11  Current functional level related to goals / functional outcomes: See below   Remaining deficits: See below   Education / Equipment: See below   Patient agrees to discharge. Patient goals were not met. Patient is being discharged due to maximized rehab potential.     PT End of Session - 06/12/21 1448     Visit Number 11    Number of Visits 12    Date for PT Re-Evaluation 06/18/21    Authorization Type [employer]    Authorization Time Period 12 visits approved 1/18-06/18/21    Authorization - Visit Number 11    Authorization - Number of Visits 12    Progress Note Due on Visit 20    PT Start Time 1447    PT Stop Time 1525    PT Time Calculation (min) 38 min    Activity Tolerance Patient tolerated treatment well;Patient limited by fatigue    Behavior During Therapy WFL for tasks assessed/performed             Past Medical History:  Diagnosis Date   Anxiety    Chronic atrial fibrillation (HCC)    Chronic back pain    Chronic diastolic heart failure (HCC)    Chronic obstructive pulmonary disease, unspecified (HCC)    Complete heart block (HCC)    COPD (chronic obstructive pulmonary disease) ([place])    Coronary atherosclerosis of native coronary artery    Multivessel status post CABG, DES PLA March 2006   DDD (degenerative disc disease), lumbar    Essential hypertension    Headache    ICD (implantable cardioverter-defibrillator) in place    Mixed hyperlipidemia    Osteoarthritis    Ventricular fibrillation ([place]) 2003   a. seen on PPM interrogation a/w  syncope    Past Surgical History:  Procedure Laterality Date   CARDIAC DEFIBRILLATOR PLACEMENT     MDT dual chamber ICD   CARDIOVERSION N/A 08/09/2014   Procedure: CARDIOVERSION;  Surgeon: Sanda Klein, MD;  [facility];  Service: Cardiovascular;  Laterality: N/A;   CORONARY ARTERY BYPASS GRAFT     LIMA to LAD, SVG to diagonal, SVG to ramus and OM   IMPLANTABLE CARDIOVERTER DEFIBRILLATOR GENERATOR CHANGE N/A 09/25/2013   Procedure: IMPLANTABLE CARDIOVERTER DEFIBRILLATOR GENERATOR CHANGE;  Surgeon: Evans Lance, MD;  Location[facility] CATH LAB;  Service: Cardiovascular;  Laterality: N/A;   LEAD REVISION N/A 09/29/2013   Procedure: LEAD REVISION;  Surgeon: Deboraha Sprang, MD;  Location: [facility] CATH LAB;  Service: Cardiovascular;  Laterality: N/A;   TONSILLECTOMY     YAG LASER APPLICATION Left 08/21/2704   Procedure: YAG LASER APPLICATION;  Surgeon: Elta Guadeloupe T. Gershon Crane, MD;  Location: [facility];  Service: Ophthalmology;  Laterality: Left;   YAG LASER APPLICATION Right 2/37/6283   Procedure: YAG LASER APPLICATION;  Surgeon: Elta Guadeloupe T. Gershon Crane, MD;  Location: [facility];  Service: Ophthalmology;  Laterality: Right;    There were no vitals filed for this visit.   Subjective Assessment - 06/12/21 1451     Subjective Patient states not much different. Went to her primary MD on Tuesday. He will refer her to pulmonary  but she doesnt know if she wants to go to it. She might be doing a little bit better with her back.    Pertinent History COPD    How long can you stand comfortably? not long, increased time wiht UE support    Patient Stated Goals to be able to be more mobile    Currently in Pain? No/denies                               [facility] Adult PT Treatment/Exercise - 06/12/21 0001       Lumbar Exercises: Standing   Row Both;20 reps    Theraband Level (Row) Level 2 (Red)    Shoulder Extension Both;20 reps    Theraband Level (Shoulder Extension) Level 2 (Red)    Other Standing  Lumbar Exercises sidestepping in parallel bars with and without BUE support with cue for slight lumbar flexion during 6RT    Other Standing Lumbar Exercises hip abduction 2x 10 bilateral                     PT Education - 06/12/21 1450     Education Details HEP    Person(s) Educated Patient    Methods Explanation;Demonstration    Comprehension Verbalized understanding;Returned demonstration              PT Short Term Goals - 06/05/21 1519       PT SHORT TERM GOAL #1   Title Patient will be independent with HEP in order to improve functional outcomes.    Baseline 05/21/21:  Reports compliance with HEP daily; 06/05/21: reports intermittent compliance with HEP daily    Status On-going      PT SHORT TERM GOAL #2   Title Patient will report at least 25% improvement in symptoms for improved quality of life.    Baseline 05/21/21:  Difficult to answer, pt reports her goals are to imrpove activity tolerance and ability to complete ADLs with increased ease   06/05/21: Patient reports unsure if any change as concerned by breathing difficulty    Status On-going               PT Long Term Goals - 06/05/21 1521       PT LONG TERM GOAL #1   Title Patient will report at least 75% improvement in symptoms for improved quality of life.    Baseline 06/05/21: unclear improvements, see short term goal unmet    Time 6    Period Weeks    Status On-going    Target Date 06/18/21      PT LONG TERM GOAL #2   Title Patient will improve FOTO score by at least 10 points in order to indicate improved tolerance to activity.    Baseline 06/05/21: FOTO decreased from 48% originally down to 43% today    Time 6    Period Weeks    Status On-going    Target Date 06/18/21      PT LONG TERM GOAL #3   Title Patient will be able to complete 5x STS in under 11.4 seconds in order to reduce the risk of falls.    Baseline 06/05/21: 5x STS done today at 11.37 seconds    Time 6    Period Weeks     Status Achieved    Target Date 06/18/21      PT LONG TERM GOAL #4   Title Patient will be  able to ambulate at least 226 feet in 2MWT in order to demonstrate improved gait speed for community ambulation.    Baseline limited by SOB    Time 6    Period Weeks    Status On-going    Target Date 06/18/21                   Plan - 06/12/21 1449     Clinical Impression Statement Patient has met 1/2 short term goals and 1/4 long term goals with intermittent ability to complete HEP and improvement in functional mobility. Patient stating possible improvement in low back symptoms but remains mostly limited by breathing difficulties especially with any mobility. Patient appears more limited by breathing rather than LBP. Reviewed HEP with patient and gave band and handouts. Patient discharged from physical therapy at this time.    Personal Factors and Comorbidities Comorbidity 3+;Fitness;Age;Time since onset of injury/illness/exacerbation    Comorbidities Arthritis, Back pain, BMI over 30, COPD, High Blood Pressure    Examination-Activity Limitations Locomotion Level;Transfers;Bend;Lift;Stand;Stairs;Squat    Examination-Participation Restrictions Meal Prep;Cleaning;Community Activity;Shop;Volunteer;Dorita Sciara    Stability/Clinical Decision Making Stable/Uncomplicated    Rehab Potential Fair    PT Frequency --    PT Duration --    PT Treatment/Interventions ADLs/Self Care Home Management;Aquatic Therapy;Electrical Stimulation;Moist Heat;Traction;DME Instruction;Gait training;Stair training;Functional mobility training;Therapeutic activities;Therapeutic exercise;Balance training;Patient/family education;Neuromuscular re-education;Manual techniques;Dry needling;Energy conservation    PT Next Visit Plan n/a    PT Home Exercise Plan trunk flexion stretch in seated, March  1/19:  diaphragmatic breathing, abdominal isometrics, glute isometrics, bridging 1/27 heel raise, sidestepping at counter, ab  march SLR; 2/1: bridge    Consulted and Agree with Plan of Care Patient             Patient will benefit from skilled therapeutic intervention in order to improve the following deficits and impairments:  Abnormal gait, Difficulty walking, Decreased range of motion, Decreased endurance, Decreased activity tolerance, Pain, Decreased balance, Improper body mechanics, Impaired flexibility, Decreased mobility, Decreased strength, Postural dysfunction  Visit Diagnosis: Low back pain, unspecified back pain laterality, unspecified chronicity, unspecified whether sciatica present  Muscle weakness (generalized)  Other abnormalities of gait and mobility  Other symptoms and signs involving the musculoskeletal system     Problem List Patient Active Problem List   Diagnosis Date Noted   Essential (primary) hypertension    Hyperlipidemia, unspecified    Presence of cardiac pacemaker    Primary pulmonary hypertension ([place])    Unspecified osteoarthritis, unspecified site    Anxiety disorder, unspecified    Atherosclerotic heart disease of native coronary artery without angina pectoris    Chronic atrial fibrillation (HCC)    Chronic diastolic (congestive) heart failure (HCC)    Chronic obstructive pulmonary disease, unspecified ([place])    Headache    Heart failure, unspecified ([place])    Unspecified fall, initial encounter    Degenerative disc disease, cervical 02/09/2018   Acute respiratory failure with hypoxia ([place]) 05/07/2017   Chronic kidney disease, stage III (moderate) (HCC) 05/07/2017   Chronic obstructive asthma with acute exacerbation ([place]) 05/05/2017   COPD exacerbation ([place]) 05/04/2017   Pacemaker complications 34/91/7915   CAP (community acquired pneumonia) 07/19/2013   Shortness of breath 07/19/2013   Chronic diastolic heart failure ([place]) 04/11/2013   Aortic stenosis 10/18/2012   Encounter for long-term (current) use of anticoagulants 10/08/2011   Mixed hyperlipidemia  08/25/2008   Essential hypertension, benign 08/25/2008   Coronary atherosclerosis of native coronary artery 08/25/2008   VENTRICULAR TACHYCARDIA  08/25/2008   Automatic implantable cardioverter-defibrillator in situ 08/25/2008   3:32 PM, 06/12/21 Mearl Latin PT, DPT Physical Therapist at [place], [place], [address] Phone: [phone]   Fax:  [phone]  Name: [name] [name] MRN: [id number] Date of Birth: 25-Jul-1931

## 2021-06-17 ENCOUNTER — Encounter (HOSPITAL_COMMUNITY): Payer: Medicare PPO | Admitting: Physical Therapy

## 2021-06-19 ENCOUNTER — Encounter (HOSPITAL_COMMUNITY): Payer: Medicare PPO | Admitting: Physical Therapy

## 2021-06-26 ENCOUNTER — Ambulatory Visit (INDEPENDENT_AMBULATORY_CARE_PROVIDER_SITE_OTHER): Payer: Medicare PPO

## 2021-06-26 DIAGNOSIS — I4729 Other ventricular tachycardia: Secondary | ICD-10-CM

## 2021-06-27 LAB — CUP PACEART REMOTE DEVICE CHECK
Battery Remaining Longevity: 1 mo
Battery Voltage: 2.72 V
Brady Statistic AP VP Percent: 89.94 %
Brady Statistic AP VS Percent: 0 %
Brady Statistic AS VP Percent: 10.04 %
Brady Statistic AS VS Percent: 0.01 %
Brady Statistic RA Percent Paced: 88.06 %
Brady Statistic RV Percent Paced: 99.76 %
Date Time Interrogation Session: 20230310133615
HighPow Impedance: 55 Ohm
HighPow Impedance: 75 Ohm
Implantable Lead Implant Date: 20030823
Implantable Lead Implant Date: 20030823
Implantable Lead Location: 753859
Implantable Lead Location: 753860
Implantable Lead Model: 157
Implantable Lead Model: 4086
Implantable Lead Serial Number: 108215
Implantable Lead Serial Number: 112190
Implantable Pulse Generator Implant Date: 20150608
Lead Channel Impedance Value: 342 Ohm
Lead Channel Impedance Value: 342 Ohm
Lead Channel Impedance Value: 399 Ohm
Lead Channel Pacing Threshold Amplitude: 0.625 V
Lead Channel Pacing Threshold Amplitude: 1.25 V
Lead Channel Pacing Threshold Pulse Width: 0.4 ms
Lead Channel Pacing Threshold Pulse Width: 0.4 ms
Lead Channel Sensing Intrinsic Amplitude: 11.75 mV
Lead Channel Sensing Intrinsic Amplitude: 11.75 mV
Lead Channel Sensing Intrinsic Amplitude: 3.125 mV
Lead Channel Sensing Intrinsic Amplitude: 3.125 mV
Lead Channel Setting Pacing Amplitude: 2 V
Lead Channel Setting Pacing Amplitude: 2.5 V
Lead Channel Setting Pacing Pulse Width: 0.4 ms
Lead Channel Setting Sensing Sensitivity: 0.3 mV

## 2021-06-30 ENCOUNTER — Other Ambulatory Visit: Payer: Self-pay | Admitting: Cardiology

## 2021-07-01 ENCOUNTER — Telehealth: Payer: Self-pay

## 2021-07-01 NOTE — Telephone Encounter (Signed)
MDT alert, device has reached RRT 3/14 ?19 AF episodes, known PAF, burden 1.7%, Eliquis, Tikosyn ?Presenting rhythm flutter with controlled ventricular rates, ongoing since 3/11 ?Route to triage ?LA ? ?Unsuccessful telephone encounter to patient to discuss ICD at RRT. Hipaa compliant VM message left request call back to [phone]. ? ? ? ? ? ?

## 2021-07-01 NOTE — Telephone Encounter (Addendum)
Patient returning call. Discussed ICD at RRT status. Informed patient she would be contacted by scheduling for a follow up appointment with Dr. Lovena Le to discuss. Patient request [place] office secondary to transportation barrier to [place]. Routing to scheduling.  ?

## 2021-07-09 NOTE — Progress Notes (Signed)
Remote ICD transmission.   

## 2021-07-14 ENCOUNTER — Telehealth: Payer: Self-pay

## 2021-07-14 NOTE — Telephone Encounter (Signed)
The patient called because she wants to come and get her device alarm to turn off. I had the nurse look at the 07/22/2021 schedule in [place] and the patient agreed to come at 2:15 pm. ?

## 2021-07-21 ENCOUNTER — Ambulatory Visit: Payer: Medicare PPO | Admitting: Orthopedic Surgery

## 2021-07-21 DIAGNOSIS — M48061 Spinal stenosis, lumbar region without neurogenic claudication: Secondary | ICD-10-CM

## 2021-07-21 NOTE — Progress Notes (Signed)
88 year old female follow-up continued spinal stenosis ? ?Patient is on a blood thinner Eliquis and is wary about getting epidural she is on Tylenol she had physical therapy she is not any better but not worse ? ?Recommend she stay as active as possible without overdoing it ? ?She can talk to cardiology about coming off Eliquis and how safe it would be ? ?She can always have the epidurals when she can come off the Eliquis ? ?Encounter Diagnosis  ?Name Primary?  ? Spinal stenosis of lumbar region, unspecified whether neurogenic claudication present Yes  ? ? ?

## 2021-07-22 ENCOUNTER — Ambulatory Visit (INDEPENDENT_AMBULATORY_CARE_PROVIDER_SITE_OTHER): Payer: Medicare PPO | Admitting: Internal Medicine

## 2021-07-22 ENCOUNTER — Encounter: Payer: Self-pay | Admitting: *Deleted

## 2021-07-22 ENCOUNTER — Encounter: Payer: Self-pay | Admitting: Internal Medicine

## 2021-07-22 VITALS — BP 108/58 | HR 83 | Ht 60.0 in | Wt 152.4 lb

## 2021-07-22 DIAGNOSIS — Z01818 Encounter for other preprocedural examination: Secondary | ICD-10-CM

## 2021-07-22 NOTE — H&P (View-Only) (Signed)
? ? ? ? ?HPI ?[name] [name] returns today for followup. She is a pleasant 88 yo woman with a h/o chronic systolic heart failure, persistent atrial fib, CHB, s/p PPM, VT, s/p ICD and asthma. She has done better in the past several months. She does not feel palpitations but her symptoms are improved. No ICD shock. She has mild peripheral edema. She admits to sodium indiscretion but has improved recently and she has reached ERI on her ICD. ?Allergies  ?Allergen Reactions  ? Codeine Nausea And Vomiting  ? Keflex [Cephalexin] Itching and Rash  ? ? ? ?Current Outpatient Medications  ?Medication Sig Dispense Refill  ? acetaminophen (TYLENOL) 500 MG tablet Take 500 mg by mouth every morning.    ? albuterol (PROVENTIL HFA;VENTOLIN HFA) 108 (90 BASE) MCG/ACT inhaler Inhale 2 puffs into the lungs every 6 (six) hours as needed for wheezing or shortness of breath. 1 Inhaler 2  ? amLODipine (NORVASC) 5 MG tablet TAKE (1) TABLET BY MOUTH ONCE DAILY. 90 tablet 3  ? apixaban (ELIQUIS) 2.5 MG TABS tablet TAKE (1) TABLET BY MOUTH TWICE DAILY. 60 tablet 5  ? atorvastatin (LIPITOR) 20 MG tablet TAKE ONE TABLET BY MOUTH ONCE DAILY. 90 tablet 3  ? Calcium Carbonate-Vitamin D (CALCIUM 600 + D PO) Take 1 tablet by mouth 2 (two) times daily.    ? Cholecalciferol (VITAMIN D) 2000 UNITS CAPS Take 1 capsule by mouth at bedtime.    ? dofetilide (TIKOSYN) 125 MCG capsule TAKE (1) CAPSULE BY MOUTH TWICE DAILY. 180 capsule 3  ? folic acid (FOLVITE) 1 MG tablet Take 1 mg by mouth 2 (two) times daily.    ? losartan (COZAAR) 100 MG tablet TAKE ONE TABLET BY MOUTH DAILY. 90 tablet 0  ? metoprolol succinate (TOPROL-XL) 100 MG 24 hr tablet TAKE ONE TABLET BY MOUTH EVERY MORNING. TAKE WITH OR IMMEDIATELY FOLLOWING A MEAL. 90 tablet 0  ? Polyethyl Glycol-Propyl Glycol (SYSTANE OP) Place 1 drop into both eyes daily as needed (Dry eyes).    ? potassium chloride SA (KLOR-CON M) 20 MEQ tablet TAKE (1) TABLET BY MOUTH 3 TIMES DAILY. 90 tablet 1  ? torsemide  (DEMADEX) 20 MG tablet TAKE 3 TABLETS BY MOUTH DAILY. 270 tablet 2  ? ?No current facility-administered medications for this visit.  ? ? ? ?Past Medical History:  ?Diagnosis Date  ? Anxiety   ? Chronic atrial fibrillation (HCC)   ? Chronic back pain   ? Chronic diastolic heart failure ([place])   ? Chronic obstructive pulmonary disease, unspecified ([place])   ? Complete heart block ([place])   ? COPD (chronic obstructive pulmonary disease) ([place])   ? Coronary atherosclerosis of native coronary artery   ? Multivessel status post CABG, DES PLA March 2006  ? DDD (degenerative disc disease), lumbar   ? Essential hypertension   ? Headache   ? ICD (implantable cardioverter-defibrillator) in place   ? Mixed hyperlipidemia   ? Osteoarthritis   ? Ventricular fibrillation ([place]) 2003  ? a. seen on PPM interrogation a/w syncope  ? ? ?ROS: ? ? All systems reviewed and negative except as noted in the HPI. ? ? ?Past Surgical History:  ?Procedure Laterality Date  ? CARDIAC DEFIBRILLATOR PLACEMENT    ? MDT dual chamber ICD  ? CARDIOVERSION N/A 08/09/2014  ? Procedure: CARDIOVERSION;  Surgeon: Sanda Klein, MD;  [facility]: [facility];  Service: Cardiovascular;  Laterality: N/A;  ? CORONARY ARTERY BYPASS GRAFT    ? LIMA to LAD, SVG to diagonal,  SVG to ramus and OM  ? IMPLANTABLE CARDIOVERTER DEFIBRILLATOR GENERATOR CHANGE N/A 09/25/2013  ? Procedure: IMPLANTABLE CARDIOVERTER DEFIBRILLATOR GENERATOR CHANGE;  Surgeon: Evans Lance, MD;  Location: [facility] CATH LAB;  Service: Cardiovascular;  Laterality: N/A;  ? LEAD REVISION N/A 09/29/2013  ? Procedure: LEAD REVISION;  Surgeon: Deboraha Sprang, MD;  Location: [facility] CATH LAB;  Service: Cardiovascular;  Laterality: N/A;  ? TONSILLECTOMY    ? YAG LASER APPLICATION Left 10/27/8919  ? Procedure: YAG LASER APPLICATION;  Surgeon: Elta Guadeloupe T. Gershon Crane, MD;  Location: [facility];  Service: Ophthalmology;  Laterality: Left;  ? YAG LASER APPLICATION Right 1/94/1740  ? Procedure: YAG LASER APPLICATION;  Surgeon: Elta Guadeloupe T. Gershon Crane,  MD;  Location: [facility];  Service: Ophthalmology;  Laterality: Right;  ? ? ? ?Family History  ?Problem Relation Age of Onset  ? Heart attack Father   ? Heart attack Brother   ? ? ? ?Social History  ? ?Socioeconomic History  ? Marital status: Widowed  ?  Spouse name: Not on file  ? Number of children: 2  ? Years of education: Not on file  ? Highest education level: Not on file  ?Occupational History  ? Occupation: Retired  ?  Comment: Registered nurse  ?  Employer: RETIRED  ?Tobacco Use  ? Smoking status: Never  ? Smokeless tobacco: Never  ?Vaping Use  ? Vaping Use: Never used  ?Substance and Sexual Activity  ? Alcohol use: No  ?  Alcohol/week: 0.0 standard drinks  ? Drug use: No  ? Sexual activity: Never  ?Other Topics Concern  ? Not on file  ?Social History Narrative  ? Not on file  ? ?Social Determinants of Health  ? ?Financial Resource Strain: Not on file  ?Food Insecurity: Not on file  ?Transportation Needs: Not on file  ?Physical Activity: Not on file  ?Stress: Not on file  ?Social Connections: Not on file  ?Intimate Partner Violence: Not on file  ? ? ? ?BP (!) 108/58   Pulse 83   Ht 5' (1.524 m)   Wt 152 lb 6.4 oz (69.1 kg)   SpO2 96%   BMI 29.76 kg/m?  ? ?Physical Exam: ? ?Well appearing 88 yo woman, NAD ?HEENT: Unremarkable ?Neck:  No JVD, no thyromegally ?Lymphatics:  No adenopathy ?Back:  No CVA tenderness ?Lungs:  Clear with no wheezes ?HEART:  Regular rate rhythm, no murmurs, no rubs, no clicks ?Abd:  soft, positive bowel sounds, no organomegally, no rebound, no guarding ?Ext:  2 plus pulses, no edema, no cyanosis, no clubbing ?Skin:  No rashes no nodules ?Neuro:  CN II through XII intact, motor grossly intact ? ?DEVICE  ?Normal device function.  See PaceArt for details.  ? ?Assess/Plan:  ?1. VT - she has only had NSVT since her last visit. ?2. Persistent atrial fib - her burden is much improved. She will continue dofetilide ?3. CHB - she is asymptomatic, s/p PPM ?4. HFPEF - she has class 2 symptoms.  We will follow ?5. ICD - she has reached ERI. She will be scheduled for ICD gen change out. ?  ?Carleene Overlie Brittany Amirault,MD ?

## 2021-07-22 NOTE — Progress Notes (Signed)
? ? ? ? ?HPI ?[name] [name] returns today for followup. She is a pleasant 88 yo woman with a h/o chronic systolic heart failure, persistent atrial fib, CHB, s/p PPM, VT, s/p ICD and asthma. She has done better in the past several months. She does not feel palpitations but her symptoms are improved. No ICD shock. She has mild peripheral edema. She admits to sodium indiscretion but has improved recently and she has reached ERI on her ICD. ?Allergies  ?Allergen Reactions  ? Codeine Nausea And Vomiting  ? Keflex [Cephalexin] Itching and Rash  ? ? ? ?Current Outpatient Medications  ?Medication Sig Dispense Refill  ? acetaminophen (TYLENOL) 500 MG tablet Take 500 mg by mouth every morning.    ? albuterol (PROVENTIL HFA;VENTOLIN HFA) 108 (90 BASE) MCG/ACT inhaler Inhale 2 puffs into the lungs every 6 (six) hours as needed for wheezing or shortness of breath. 1 Inhaler 2  ? amLODipine (NORVASC) 5 MG tablet TAKE (1) TABLET BY MOUTH ONCE DAILY. 90 tablet 3  ? apixaban (ELIQUIS) 2.5 MG TABS tablet TAKE (1) TABLET BY MOUTH TWICE DAILY. 60 tablet 5  ? atorvastatin (LIPITOR) 20 MG tablet TAKE ONE TABLET BY MOUTH ONCE DAILY. 90 tablet 3  ? Calcium Carbonate-Vitamin D (CALCIUM 600 + D PO) Take 1 tablet by mouth 2 (two) times daily.    ? Cholecalciferol (VITAMIN D) 2000 UNITS CAPS Take 1 capsule by mouth at bedtime.    ? dofetilide (TIKOSYN) 125 MCG capsule TAKE (1) CAPSULE BY MOUTH TWICE DAILY. 180 capsule 3  ? folic acid (FOLVITE) 1 MG tablet Take 1 mg by mouth 2 (two) times daily.    ? losartan (COZAAR) 100 MG tablet TAKE ONE TABLET BY MOUTH DAILY. 90 tablet 0  ? metoprolol succinate (TOPROL-XL) 100 MG 24 hr tablet TAKE ONE TABLET BY MOUTH EVERY MORNING. TAKE WITH OR IMMEDIATELY FOLLOWING A MEAL. 90 tablet 0  ? Polyethyl Glycol-Propyl Glycol (SYSTANE OP) Place 1 drop into both eyes daily as needed (Dry eyes).    ? potassium chloride SA (KLOR-CON M) 20 MEQ tablet TAKE (1) TABLET BY MOUTH 3 TIMES DAILY. 90 tablet 1  ? torsemide  (DEMADEX) 20 MG tablet TAKE 3 TABLETS BY MOUTH DAILY. 270 tablet 2  ? ?No current facility-administered medications for this visit.  ? ? ? ?Past Medical History:  ?Diagnosis Date  ? Anxiety   ? Chronic atrial fibrillation (HCC)   ? Chronic back pain   ? Chronic diastolic heart failure ([place])   ? Chronic obstructive pulmonary disease, unspecified ([place])   ? Complete heart block ([place])   ? COPD (chronic obstructive pulmonary disease) ([place])   ? Coronary atherosclerosis of native coronary artery   ? Multivessel status post CABG, DES PLA March 2006  ? DDD (degenerative disc disease), lumbar   ? Essential hypertension   ? Headache   ? ICD (implantable cardioverter-defibrillator) in place   ? Mixed hyperlipidemia   ? Osteoarthritis   ? Ventricular fibrillation ([place]) 2003  ? a. seen on PPM interrogation a/w syncope  ? ? ?ROS: ? ? All systems reviewed and negative except as noted in the HPI. ? ? ?Past Surgical History:  ?Procedure Laterality Date  ? CARDIAC DEFIBRILLATOR PLACEMENT    ? MDT dual chamber ICD  ? CARDIOVERSION N/A 08/09/2014  ? Procedure: CARDIOVERSION;  Surgeon: Sanda Klein, MD;  [facility];  Service: Cardiovascular;  Laterality: N/A;  ? CORONARY ARTERY BYPASS GRAFT    ? LIMA to LAD, SVG to diagonal,  SVG to ramus and OM  ? IMPLANTABLE CARDIOVERTER DEFIBRILLATOR GENERATOR CHANGE N/A 09/25/2013  ? Procedure: IMPLANTABLE CARDIOVERTER DEFIBRILLATOR GENERATOR CHANGE;  Surgeon: Evans Lance, MD;  Location: [facility] CATH LAB;  Service: Cardiovascular;  Laterality: N/A;  ? LEAD REVISION N/A 09/29/2013  ? Procedure: LEAD REVISION;  Surgeon: Deboraha Sprang, MD;  Location: [facility] CATH LAB;  Service: Cardiovascular;  Laterality: N/A;  ? TONSILLECTOMY    ? YAG LASER APPLICATION Left 06/23/4654  ? Procedure: YAG LASER APPLICATION;  Surgeon: Elta Guadeloupe T. Gershon Crane, MD;  Location: [facility];  Service: Ophthalmology;  Laterality: Left;  ? YAG LASER APPLICATION Right 11/30/7515  ? Procedure: YAG LASER APPLICATION;  Surgeon: Elta Guadeloupe T. Gershon Crane,  MD;  Location: [facility];  Service: Ophthalmology;  Laterality: Right;  ? ? ? ?Family History  ?Problem Relation Age of Onset  ? Heart attack Father   ? Heart attack Brother   ? ? ? ?Social History  ? ?Socioeconomic History  ? Marital status: Widowed  ?  Spouse name: Not on file  ? Number of children: 2  ? Years of education: Not on file  ? Highest education level: Not on file  ?Occupational History  ? Occupation: Retired  ?  Comment: Registered nurse  ?  Employer: RETIRED  ?Tobacco Use  ? Smoking status: Never  ? Smokeless tobacco: Never  ?Vaping Use  ? Vaping Use: Never used  ?Substance and Sexual Activity  ? Alcohol use: No  ?  Alcohol/week: 0.0 standard drinks  ? Drug use: No  ? Sexual activity: Never  ?Other Topics Concern  ? Not on file  ?Social History Narrative  ? Not on file  ? ?Social Determinants of Health  ? ?Financial Resource Strain: Not on file  ?Food Insecurity: Not on file  ?Transportation Needs: Not on file  ?Physical Activity: Not on file  ?Stress: Not on file  ?Social Connections: Not on file  ?Intimate Partner Violence: Not on file  ? ? ? ?BP (!) 108/58   Pulse 83   Ht 5' (1.524 m)   Wt 152 lb 6.4 oz (69.1 kg)   SpO2 96%   BMI 29.76 kg/m?  ? ?Physical Exam: ? ?Well appearing 88 yo woman, NAD ?HEENT: Unremarkable ?Neck:  No JVD, no thyromegally ?Lymphatics:  No adenopathy ?Back:  No CVA tenderness ?Lungs:  Clear with no wheezes ?HEART:  Regular rate rhythm, no murmurs, no rubs, no clicks ?Abd:  soft, positive bowel sounds, no organomegally, no rebound, no guarding ?Ext:  2 plus pulses, no edema, no cyanosis, no clubbing ?Skin:  No rashes no nodules ?Neuro:  CN II through XII intact, motor grossly intact ? ?DEVICE  ?Normal device function.  See PaceArt for details.  ? ?Assess/Plan:  ?1. VT - she has only had NSVT since her last visit. ?2. Persistent atrial fib - her burden is much improved. She will continue dofetilide ?3. CHB - she is asymptomatic, s/p PPM ?4. HFPEF - she has class 2 symptoms.  We will follow ?5. ICD - she has reached ERI. She will be scheduled for ICD gen change out. ?  ?Carleene Overlie Kanaya Gunnarson,MD ?

## 2021-07-22 NOTE — Patient Instructions (Addendum)
Medication Instructions:  ?Your physician recommends that you continue on your current medications as directed. Please refer to the Current Medication list given to you today. ? ?*If you need a refill on your cardiac medications before your next appointment, please call your pharmacy* ? ? ?Lab Work: ?Your physician recommends that you return for lab work in: BMET, CBC ? ?If you have labs (blood work) drawn today and your tests are completely normal, you will receive your results only by: ?MyChart Message (if you have MyChart) OR ?A paper copy in the mail ?If you have any lab test that is abnormal or we need to change your treatment, we will call you to review the results. ? ? ?Testing/Procedures: ?Your physician has recommended that you have a defibrillator inserted. An implantable cardioverter defibrillator (ICD) is a small device that is placed in your chest or, in rare cases, your abdomen. This device uses electrical pulses or shocks to help control life-threatening, irregular heartbeats that could lead the heart to suddenly stop beating (sudden cardiac arrest). Leads are attached to the ICD that goes into your heart. This is done in the hospital and usually requires an overnight stay. Please see the instruction sheet given to you today for more information. ? ? ? ?Follow-Up: ?At [facility], you and your health needs are our priority.  As part of our continuing mission to provide you with exceptional heart care, we have created designated Provider Care Teams.  These Care Teams include your primary Cardiologist (physician) and Advanced Practice Providers (APPs -  Physician Assistants and Nurse Practitioners) who all work together to provide you with the care you need, when you need it. ? ?We recommend signing up for the patient portal called "MyChart".  Sign up information is provided on this After Visit Summary.  MyChart is used to connect with patients for Virtual Visits (Telemedicine).  Patients are able to  view lab/test results, encounter notes, upcoming appointments, etc.  Non-urgent messages can be sent to your provider as well.   ?To learn more about what you can do with MyChart, go to [email or web address].   ? ?Your next appointment:   ? 14 Days for wound check in [place] office  ? ?The format for your next appointment:   ?In Person ? ?Provider:   ?[name], MD  ? ? ?Other Instructions ?Thank you for choosing [facility]! ?  ? ?

## 2021-07-24 ENCOUNTER — Other Ambulatory Visit (HOSPITAL_COMMUNITY)
Admission: RE | Admit: 2021-07-24 | Discharge: 2021-07-24 | Disposition: A | Discharge status: Home / Self Care | Payer: Medicare PPO | Source: Ambulatory Visit | Attending: Internal Medicine | Admitting: Internal Medicine

## 2021-07-24 DIAGNOSIS — Z01818 Encounter for other preprocedural examination: Secondary | ICD-10-CM | POA: Diagnosis not present

## 2021-07-24 LAB — CBC
HCT: 38.3 % (ref 36.0–46.0)
Hemoglobin: 12.6 g/dL (ref 12.0–15.0)
MCH: 32.2 pg (ref 26.0–34.0)
MCHC: 32.9 g/dL (ref 30.0–36.0)
MCV: 98 fL (ref 80.0–100.0)
Platelets: 179 10*3/uL (ref 150–400)
RBC: 3.91 MIL/uL (ref 3.87–5.11)
RDW: 14 % (ref 11.5–15.5)
WBC: 7.7 10*3/uL (ref 4.0–10.5)
nRBC: 0 % (ref 0.0–0.2)

## 2021-07-24 LAB — BASIC METABOLIC PANEL
Anion gap: 9 (ref 5–15)
BUN: 45 mg/dL — ABNORMAL HIGH (ref 8–23)
CO2: 27 mmol/L (ref 22–32)
Calcium: 9.4 mg/dL (ref 8.9–10.3)
Chloride: 104 mmol/L (ref 98–111)
Creatinine, Ser: 1.83 mg/dL — ABNORMAL HIGH (ref 0.44–1.00)
GFR, Estimated: 26 mL/min — ABNORMAL LOW (ref >60)
Glucose, Bld: 112 mg/dL — ABNORMAL HIGH (ref 70–99)
Potassium: 4.6 mmol/L (ref 3.5–5.1)
Sodium: 140 mmol/L (ref 135–145)

## 2021-07-28 ENCOUNTER — Ambulatory Visit (INDEPENDENT_AMBULATORY_CARE_PROVIDER_SITE_OTHER): Payer: Medicare PPO

## 2021-07-28 DIAGNOSIS — I4729 Other ventricular tachycardia: Secondary | ICD-10-CM

## 2021-07-29 ENCOUNTER — Other Ambulatory Visit: Payer: Self-pay | Admitting: Cardiology

## 2021-07-30 LAB — CUP PACEART REMOTE DEVICE CHECK
Battery Remaining Longevity: 1 mo — CL
Battery Voltage: 2.69 V
Brady Statistic AP VP Percent: 75.69 %
Brady Statistic AP VS Percent: 0 %
Brady Statistic AS VP Percent: 24.28 %
Brady Statistic AS VS Percent: 0.03 %
Brady Statistic RA Percent Paced: 72.66 %
Brady Statistic RV Percent Paced: 99.81 %
Date Time Interrogation Session: 20230410042503
HighPow Impedance: 58 Ohm
HighPow Impedance: 74 Ohm
Implantable Lead Implant Date: 20030823
Implantable Lead Implant Date: 20030823
Implantable Lead Location: 753859
Implantable Lead Location: 753860
Implantable Lead Model: 157
Implantable Lead Model: 4086
Implantable Lead Serial Number: 108215
Implantable Lead Serial Number: 112190
Implantable Pulse Generator Implant Date: 20150608
Lead Channel Impedance Value: 304 Ohm
Lead Channel Impedance Value: 361 Ohm
Lead Channel Impedance Value: 399 Ohm
Lead Channel Pacing Threshold Amplitude: 0.625 V
Lead Channel Pacing Threshold Amplitude: 1.375 V
Lead Channel Pacing Threshold Pulse Width: 0.4 ms
Lead Channel Pacing Threshold Pulse Width: 0.4 ms
Lead Channel Sensing Intrinsic Amplitude: 1.125 mV
Lead Channel Sensing Intrinsic Amplitude: 1.125 mV
Lead Channel Sensing Intrinsic Amplitude: 11.75 mV
Lead Channel Sensing Intrinsic Amplitude: 11.75 mV
Lead Channel Setting Pacing Amplitude: 2 V
Lead Channel Setting Pacing Amplitude: 2.75 V
Lead Channel Setting Pacing Pulse Width: 0.4 ms
Lead Channel Setting Sensing Sensitivity: 0.3 mV

## 2021-08-04 ENCOUNTER — Telehealth: Payer: Self-pay

## 2021-08-04 NOTE — Telephone Encounter (Signed)
Returned call to Pt. ? ?She has trouble swallowing her pills without food. ? ?Advised she only needs to take dofetilide and Toprol on AM of procedure. ? ?She indicates understanding. ?

## 2021-08-04 NOTE — Telephone Encounter (Signed)
Patient called in and has questions about her meds she needs to take before the procedure and wants to clarify some things  ?

## 2021-08-05 NOTE — Pre-Procedure Instructions (Signed)
Attempted to call patient regarding procedure instructions for tomorrow.  No answer. 

## 2021-08-06 ENCOUNTER — Encounter (HOSPITAL_COMMUNITY)
Admission: RE | Disposition: A | Discharge status: Home / Self Care | Payer: Self-pay | Source: Home / Self Care | Attending: Internal Medicine

## 2021-08-06 ENCOUNTER — Other Ambulatory Visit: Payer: Self-pay

## 2021-08-06 ENCOUNTER — Ambulatory Visit (HOSPITAL_COMMUNITY)
Admission: RE | Admit: 2021-08-06 | Discharge: 2021-08-06 | Disposition: A | Discharge status: Home / Self Care | Payer: Medicare PPO | Attending: Internal Medicine | Admitting: Internal Medicine

## 2021-08-06 DIAGNOSIS — Z4502 Encounter for adjustment and management of automatic implantable cardiac defibrillator: Secondary | ICD-10-CM | POA: Diagnosis not present

## 2021-08-06 DIAGNOSIS — Z79899 Other long term (current) drug therapy: Secondary | ICD-10-CM | POA: Insufficient documentation

## 2021-08-06 DIAGNOSIS — I4819 Other persistent atrial fibrillation: Secondary | ICD-10-CM | POA: Insufficient documentation

## 2021-08-06 DIAGNOSIS — I472 Ventricular tachycardia, unspecified: Secondary | ICD-10-CM | POA: Diagnosis not present

## 2021-08-06 DIAGNOSIS — I442 Atrioventricular block, complete: Secondary | ICD-10-CM | POA: Diagnosis not present

## 2021-08-06 DIAGNOSIS — I5042 Chronic combined systolic (congestive) and diastolic (congestive) heart failure: Secondary | ICD-10-CM | POA: Insufficient documentation

## 2021-08-06 DIAGNOSIS — I110 Hypertensive heart disease with heart failure: Secondary | ICD-10-CM | POA: Diagnosis not present

## 2021-08-06 DIAGNOSIS — I4901 Ventricular fibrillation: Secondary | ICD-10-CM | POA: Diagnosis not present

## 2021-08-06 HISTORY — PX: ICD GENERATOR CHANGEOUT: EP1231

## 2021-08-06 SURGERY — ICD GENERATOR CHANGEOUT

## 2021-08-06 MED ORDER — FUROSEMIDE 10 MG/ML IJ SOLN
INTRAMUSCULAR | Status: AC
  Filled 2021-08-06: qty 4

## 2021-08-06 MED ORDER — ONDANSETRON HCL 4 MG/2ML IJ SOLN: 4.0000 mg | Freq: Four times a day (QID) | INTRAMUSCULAR | Status: DC | PRN

## 2021-08-06 MED ORDER — LIDOCAINE HCL (PF) 1 % IJ SOLN
INTRAMUSCULAR | Status: AC
  Filled 2021-08-06: qty 60

## 2021-08-06 MED ORDER — SODIUM CHLORIDE 0.9 % IV SOLN
80.0000 mg | INTRAVENOUS | Status: AC
  Administered 2021-08-06: 80 mg

## 2021-08-06 MED ORDER — FENTANYL CITRATE (PF) 100 MCG/2ML IJ SOLN
INTRAMUSCULAR | Status: DC | PRN
  Administered 2021-08-06: 12.5 ug via INTRAVENOUS

## 2021-08-06 MED ORDER — SODIUM CHLORIDE 0.9 % IV SOLN
INTRAVENOUS | Status: AC
  Filled 2021-08-06: qty 2

## 2021-08-06 MED ORDER — SODIUM CHLORIDE 0.9 % IV SOLN: INTRAVENOUS | Status: DC

## 2021-08-06 MED ORDER — POVIDONE-IODINE 10 % EX SWAB
2.0000 "application " | Freq: Once | CUTANEOUS | Status: AC
  Administered 2021-08-06: 2 via TOPICAL

## 2021-08-06 MED ORDER — DIPHENHYDRAMINE HCL 50 MG/ML IJ SOLN
25.0000 mg | Freq: Once | INTRAMUSCULAR | Status: DC
Start: 2021-08-06 — End: 2021-08-06

## 2021-08-06 MED ORDER — LIDOCAINE HCL (PF) 1 % IJ SOLN
INTRAMUSCULAR | Status: DC | PRN
  Administered 2021-08-06: 60 mL

## 2021-08-06 MED ORDER — VANCOMYCIN HCL IN DEXTROSE 1-5 GM/200ML-% IV SOLN
1000.0000 mg | INTRAVENOUS | Status: AC
  Administered 2021-08-06: 1000 mg via INTRAVENOUS

## 2021-08-06 MED ORDER — FENTANYL CITRATE (PF) 100 MCG/2ML IJ SOLN
INTRAMUSCULAR | Status: AC
  Filled 2021-08-06: qty 2

## 2021-08-06 MED ORDER — CHLORHEXIDINE GLUCONATE 4 % EX LIQD
4.0000 | Freq: Once | CUTANEOUS | Status: DC
Start: 2021-08-06 — End: 2021-08-06

## 2021-08-06 MED ORDER — VANCOMYCIN HCL IN DEXTROSE 1-5 GM/200ML-% IV SOLN
INTRAVENOUS | Status: AC
  Filled 2021-08-06: qty 200

## 2021-08-06 MED ORDER — ACETAMINOPHEN 325 MG PO TABS
325.0000 mg | ORAL_TABLET | ORAL | Status: DC | PRN
  Filled 2021-08-06: qty 2

## 2021-08-06 MED ORDER — FUROSEMIDE 10 MG/ML IJ SOLN
INTRAMUSCULAR | Status: DC | PRN
  Administered 2021-08-06: 80 mg via INTRAVENOUS

## 2021-08-06 MED ORDER — DIPHENHYDRAMINE HCL 50 MG/ML IJ SOLN
INTRAMUSCULAR | Status: AC
  Administered 2021-08-06: 25 mg
  Filled 2021-08-06: qty 1

## 2021-08-06 SURGICAL SUPPLY — 7 items
CABLE SURGICAL S-101-97-12 (CABLE) ×1 IMPLANT
ICD EVERA XT MRI DF1  DDMB1D1 (ICD Generator) ×2 IMPLANT
ICD EVERA XT MRI DF1 DDMB1D1 (ICD Generator) IMPLANT
PAD DEFIB RADIO PHYSIO CONN (PAD) ×2 IMPLANT
POUCH AIGIS-R ANTIBACT ICD (Mesh General) ×2 IMPLANT
POUCH AIGIS-R ANTIBACT ICD LRG (Mesh General) IMPLANT
TRAY PACEMAKER INSERTION (PACKS) ×2 IMPLANT

## 2021-08-06 NOTE — Interval H&P Note (Signed)
History and Physical Interval Note: ? ?08/06/2021 ?12:47 PM ? ?[name] [name]  has presented today for surgery, with the diagnosis of eri.  The various methods of treatment have been discussed with the patient and family. After consideration of risks, benefits and other options for treatment, the patient has consented to  Procedure(s): ?ICD Angie (N/A) as a surgical intervention.  The patient's history has been reviewed, patient examined, no change in status, stable for surgery.  I have reviewed the patient's chart and labs.  Questions were answered to the patient's satisfaction.   ? ? ?[name] ? ? ?

## 2021-08-06 NOTE — Discharge Instructions (Signed)

## 2021-08-07 ENCOUNTER — Encounter: Payer: Medicare PPO | Admitting: Internal Medicine

## 2021-08-07 ENCOUNTER — Encounter (HOSPITAL_COMMUNITY): Payer: Self-pay | Admitting: Internal Medicine

## 2021-08-08 ENCOUNTER — Telehealth: Payer: Self-pay

## 2021-08-08 NOTE — Telephone Encounter (Signed)
Successful telephone encounter to patient on behalf of Myrtie Hawk, RN. Patient confirming that she should hold her Eliquis until Monday 08/11/21 as written on her discharge instructions. Patient states she thought someone told her to restart on Saturday 08/09/21. Discussed with Tamala Julian RN who confirms patient should follow instructions for resuming Eliquis as outline in discharge. Patient verbalizes understanding and will resume eliquis 4/24. ?

## 2021-08-08 NOTE — Telephone Encounter (Signed)
Pt would like for Sonia Baller, rn to give her a call back. She has some questions for the nurse.  ?

## 2021-08-14 ENCOUNTER — Other Ambulatory Visit: Payer: Self-pay | Admitting: Cardiology

## 2021-08-14 NOTE — Progress Notes (Signed)
Remote ICD transmission.   

## 2021-08-14 NOTE — Addendum Note (Signed)
Addended by: Cheri Kearns A on: 08/14/2021 01:50 PM ? ? Modules accepted: Level of Service ? ?

## 2021-08-15 NOTE — Telephone Encounter (Signed)
Prescription refill request for Eliquis received. ?Indication: Atrial Fib ?Last office visit: 07/22/21  Beckie Salts MD ?Scr: 1.83 on 07/24/21 ?Age: 88 ?Weight: 69.1kg ? ?Based on above findings Eliquis 2.'5mg'$  twice daily is the appropriate dose.  Refill approved. ? ?

## 2021-08-18 ENCOUNTER — Telehealth: Payer: Self-pay

## 2021-08-18 NOTE — Telephone Encounter (Signed)
The patient called stating her ICD alarmed. It was more like a beep. I asked her was she near any magnets and she did not know. I had her send a manual transmission for the nurse to review. ? ?I told her per the nurse there was nothing on her transmission. Everything looks good.  ?

## 2021-08-20 ENCOUNTER — Ambulatory Visit (INDEPENDENT_AMBULATORY_CARE_PROVIDER_SITE_OTHER): Payer: Medicare PPO

## 2021-08-20 DIAGNOSIS — I4729 Other ventricular tachycardia: Secondary | ICD-10-CM | POA: Diagnosis not present

## 2021-08-20 NOTE — Patient Instructions (Signed)
   After Your ICD (Implantable Cardiac Defibrillator)    Monitor your defibrillator site for redness, swelling, and drainage. Call the device clinic at [phone] if you experience these symptoms or fever/chills.  Your incision was closed with Steri-strips or staples:  You may shower 7 days after your procedure and wash your incision with soap and water. Avoid lotions, ointments, or perfumes over your incision until it is well-healed.  You may use a hot tub or a pool after your wound check appointment if the incision is completely closed.   Your ICD is designed to protect you from life threatening heart rhythms. Because of this, you may receive a shock.   1 shock with no symptoms:  Call the office during business hours. 1 shock with symptoms (chest pain, chest pressure, dizziness, lightheadedness, shortness of breath, overall feeling unwell):  Call 911. If you experience 2 or more shocks in 24 hours:  Call 911. If you receive a shock, you should not drive.  [place] DMV - no driving for 6 months if you receive appropriate therapy from your ICD.   ICD Alerts:  Some alerts are vibratory and others beep. These are NOT emergencies. Please call our office to let us know. If this occurs at night or on weekends, it can wait until the next business day. Send a remote transmission.  If your device is capable of reading fluid status (for heart failure), you will be offered monthly monitoring to review this with you.   Remote monitoring is used to monitor your ICD from home. This monitoring is scheduled every 91 days by our office. It allows us to keep an eye on the functioning of your device to ensure it is working properly. You will routinely see your Electrophysiologist annually (more often if necessary).  

## 2021-08-25 LAB — CUP PACEART INCLINIC DEVICE CHECK
Battery Remaining Longevity: 85 mo
Battery Voltage: 3.04 V
Brady Statistic AP VP Percent: 12.09 %
Brady Statistic AP VS Percent: 0 %
Brady Statistic AS VP Percent: 87.84 %
Brady Statistic AS VS Percent: 0.07 %
Brady Statistic RA Percent Paced: 11.33 %
Brady Statistic RV Percent Paced: 99.83 %
Date Time Interrogation Session: 20230503162200
HighPow Impedance: 56 Ohm
HighPow Impedance: 82 Ohm
Implantable Lead Implant Date: 20030823
Implantable Lead Implant Date: 20030823
Implantable Lead Location: 753859
Implantable Lead Location: 753860
Implantable Lead Model: 157
Implantable Lead Model: 4086
Implantable Lead Serial Number: 108215
Implantable Lead Serial Number: 112190
Implantable Pulse Generator Implant Date: 20230419
Lead Channel Impedance Value: 323 Ohm
Lead Channel Impedance Value: 342 Ohm
Lead Channel Impedance Value: 456 Ohm
Lead Channel Pacing Threshold Amplitude: 1.375 V
Lead Channel Pacing Threshold Pulse Width: 0.4 ms
Lead Channel Sensing Intrinsic Amplitude: 11.75 mV
Lead Channel Sensing Intrinsic Amplitude: 11.75 mV
Lead Channel Sensing Intrinsic Amplitude: 3.125 mV
Lead Channel Sensing Intrinsic Amplitude: 3.25 mV
Lead Channel Setting Pacing Amplitude: 2 V
Lead Channel Setting Pacing Amplitude: 3 V
Lead Channel Setting Pacing Pulse Width: 0.4 ms
Lead Channel Setting Sensing Sensitivity: 0.3 mV

## 2021-08-25 NOTE — Progress Notes (Signed)
Wound check appointment. Steri-strips removed. Wound without redness or edema. Incision edges approximated, wound well healed. Normal device function. Thresholds, sensing, and impedances consistent with implant measurements. Device programmed at 3.5V for extra safety margin until 3 month visit. Histogram distribution appropriate for patient and level of activity. AT/AF burden 7.3%, Eliquis on file. 1 NSVT events logged, <20 beats. Patient educated about wound care, arm mobility, lifting restrictions, shock plan. ROV in 3 months with implanting physician. ?

## 2021-09-04 DIAGNOSIS — I5030 Unspecified diastolic (congestive) heart failure: Secondary | ICD-10-CM | POA: Diagnosis not present

## 2021-09-04 DIAGNOSIS — I700 Atherosclerosis of aorta: Secondary | ICD-10-CM | POA: Diagnosis not present

## 2021-09-04 DIAGNOSIS — Z79899 Other long term (current) drug therapy: Secondary | ICD-10-CM | POA: Diagnosis not present

## 2021-09-04 DIAGNOSIS — I10 Essential (primary) hypertension: Secondary | ICD-10-CM | POA: Diagnosis not present

## 2021-09-04 DIAGNOSIS — N1832 Chronic kidney disease, stage 3b: Secondary | ICD-10-CM | POA: Diagnosis not present

## 2021-09-04 DIAGNOSIS — I480 Paroxysmal atrial fibrillation: Secondary | ICD-10-CM | POA: Diagnosis not present

## 2021-09-10 ENCOUNTER — Ambulatory Visit: Payer: Medicare PPO | Admitting: Internal Medicine

## 2021-09-11 DIAGNOSIS — I700 Atherosclerosis of aorta: Secondary | ICD-10-CM | POA: Diagnosis not present

## 2021-09-11 DIAGNOSIS — N1832 Chronic kidney disease, stage 3b: Secondary | ICD-10-CM | POA: Diagnosis not present

## 2021-09-11 DIAGNOSIS — I5030 Unspecified diastolic (congestive) heart failure: Secondary | ICD-10-CM | POA: Diagnosis not present

## 2021-09-22 ENCOUNTER — Ambulatory Visit: Payer: Medicare PPO | Admitting: Orthopedic Surgery

## 2021-09-22 DIAGNOSIS — M48061 Spinal stenosis, lumbar region without neurogenic claudication: Secondary | ICD-10-CM | POA: Diagnosis not present

## 2021-09-22 NOTE — Addendum Note (Signed)
Addended by: Arther Abbott E on: 09/22/2021 02:45 PM   Modules accepted: Level of Service

## 2021-09-22 NOTE — Progress Notes (Addendum)
Chief Complaint  Patient presents with   Follow-up    Recheck on back pain    Encounter Diagnosis  Name Primary?   Spinal stenosis of lumbar region, unspecified whether neurogenic claudication present Yes   [name] [name] IS HERE FOR ROUTINE FOLLOW UP, SHE'S NOT ON ANYTHING OTHER THAN TYLENOL (ON ANTI-COAGULATION)   ACTIVITY RELATED PAIN STANDING SEEMS TO BOTHER HER   SEEMS TO GET BY WITH HEATING PAD AND TYLENOL.  BACK TENDERNESS NONE / STRENGTH IN THE LEGS IS GOOD 5/5    Encounter Diagnosis  Name Primary?   Spinal stenosis of lumbar region, unspecified whether neurogenic claudication present Yes    STABLE AT PRESENT   SHE HAS BEEN ADVISED BY CARDIOLOGYY TO NOT DO ESI  REC 6 MONTHS FU

## 2021-09-25 DIAGNOSIS — X32XXXD Exposure to sunlight, subsequent encounter: Secondary | ICD-10-CM | POA: Diagnosis not present

## 2021-09-25 DIAGNOSIS — L820 Inflamed seborrheic keratosis: Secondary | ICD-10-CM | POA: Diagnosis not present

## 2021-09-25 DIAGNOSIS — L570 Actinic keratosis: Secondary | ICD-10-CM | POA: Diagnosis not present

## 2021-10-03 ENCOUNTER — Other Ambulatory Visit: Payer: Self-pay | Admitting: Cardiology

## 2021-10-22 ENCOUNTER — Ambulatory Visit: Payer: Medicare PPO | Admitting: Cardiology

## 2021-10-22 ENCOUNTER — Encounter: Payer: Self-pay | Admitting: Cardiology

## 2021-10-22 VITALS — BP 128/62 | HR 64 | Ht 60.0 in | Wt 152.6 lb

## 2021-10-22 DIAGNOSIS — R0602 Shortness of breath: Secondary | ICD-10-CM | POA: Diagnosis not present

## 2021-10-22 DIAGNOSIS — I480 Paroxysmal atrial fibrillation: Secondary | ICD-10-CM

## 2021-10-22 DIAGNOSIS — I25119 Atherosclerotic heart disease of native coronary artery with unspecified angina pectoris: Secondary | ICD-10-CM | POA: Diagnosis not present

## 2021-10-22 NOTE — Progress Notes (Signed)
Cardiology Office Note  Date: 10/22/2021   ID: [place], [place] 11/28/1931, MRN [id number]  PCP:  Asencion Noble, MD  Cardiologist:  Rozann Lesches, MD Electrophysiologist:  [name], MD   Chief Complaint  Patient presents with   Cardiac follow-up    History of Present Illness: [name] [name] is an 88 y.o. female last seen in December 2022 by Ms. Strader PA-C, I reviewed the note.  She is here today with her son for a follow-up visit.  Reports no active sense of palpitations or chest pain.  She remains chronically short of breath, functional with ADLs but does use a cane or walker to steady herself.  She is limited by chronic back and leg pain.  She tells me that Dr. Willey Blade referred her for pulmonary evaluation, she has not yet decided whether she will follow through on that or not.  She underwent generator change out with Dr. Lovena Le in April, Medtronic ICD in place.  Last device interrogation showed approximately 7% AT/AF burden.  She has had good control of her atrial fibrillation since being on Tikosyn.  Last cardiac structural and ischemic work-up was in 2021 as noted below.  LVEF has been normal with associated diastolic dysfunction and also mild to moderate pulmonary hypertension.  She had no evidence of ischemia by Myoview at that point.  I have been reluctant to pursue further invasive cardiac testing at this point particularly in light of her renal insufficiency.  Past Medical History:  Diagnosis Date   Anxiety    Chronic atrial fibrillation (HCC)    Chronic back pain    Chronic diastolic heart failure (HCC)    Chronic obstructive pulmonary disease, unspecified (HCC)    Complete heart block (HCC)    COPD (chronic obstructive pulmonary disease) (HCC)    Coronary atherosclerosis of native coronary artery    Multivessel status post CABG, DES PLA March 2006   DDD (degenerative disc disease), lumbar    Essential hypertension    Headache    ICD (implantable  cardioverter-defibrillator) in place    Mixed hyperlipidemia    Osteoarthritis    Ventricular fibrillation ([place]) 2003   a. seen on PPM interrogation a/w syncope    Past Surgical History:  Procedure Laterality Date   CARDIAC DEFIBRILLATOR PLACEMENT     MDT dual chamber ICD   CARDIOVERSION N/A 08/09/2014   Procedure: CARDIOVERSION;  Surgeon: Sanda Klein, MD;  Location: [facility] ENDOS[facility];  Service: Cardiovascular;  Laterality: N/A;   CORONARY ARTERY BYPASS GRAFT     LIMA to LAD, SVG to diagonal, SVG to ramus and OM   ICD GENERATOR CHANGEOUT N/A 08/06/2021   Procedure: ICD GENERATOR CHANGEOUT;  Surgeon: Evans Lance, MD;  Location: [facility] CV LAB;  Service: Cardiovascular;  Laterality: N/A;   IMPLANTABLE CARDIOVERTER DEFIBRILLATOR GENERATOR CHANGE N/A 09/25/2013   Procedure: IMPLANTABLE CARDIOVERTER DEFIBRILLATOR GENERATOR CHANGE;  Surgeon: Evans Lance, MD;  Location[facility] CATH LAB;  Service: Cardiovascular;  Laterality: N/A;   LEAD REVISION N/A 09/29/2013   Procedure: LEAD REVISION;  Surgeon: Deboraha Sprang, MD;  Location: [facility] CATH LAB;  Service: Cardiovascular;  Laterality: N/A;   TONSILLECTOMY     YAG LASER APPLICATION Left 09/23/6810   Procedure: YAG LASER APPLICATION;  Surgeon: Elta Guadeloupe T. Gershon Crane, MD;  Location: [facility] ORS;  Service: Ophthalmology;  Laterality: Left;   YAG LASER APPLICATION Right 7/51/7001   Procedure: YAG LASER APPLICATION;  Surgeon: Elta Guadeloupe T. Gershon Crane, MD;  Location: [facility] ORS;  Service: Ophthalmology;  Laterality: Right;    Current Outpatient Medications  Medication Sig Dispense Refill   acetaminophen (TYLENOL) 500 MG tablet Take 500 mg by mouth every morning.     albuterol (PROVENTIL HFA;VENTOLIN HFA) 108 (90 BASE) MCG/ACT inhaler Inhale 2 puffs into the lungs every 6 (six) hours as needed for wheezing or shortness of breath. 1 Inhaler 2   amLODipine (NORVASC) 5 MG tablet TAKE (1) TABLET BY MOUTH ONCE DAILY. 90 tablet 3   apixaban (ELIQUIS) 2.5 MG TABS tablet TAKE (1) TABLET  BY MOUTH TWICE DAILY. 60 tablet 6   atorvastatin (LIPITOR) 20 MG tablet TAKE ONE TABLET BY MOUTH ONCE DAILY. 90 tablet 3   Calcium Carbonate-Vitamin D (CALCIUM 600 + D PO) Take 1 tablet by mouth 2 (two) times daily.     Cholecalciferol (VITAMIN D) 2000 UNITS CAPS Take 2,000 Units by mouth at bedtime.     Clobetasol Propionate Emulsion 0.05 % topical foam Apply 1 application. topically daily as needed (scalp and neck). Solution     dofetilide (TIKOSYN) 125 MCG capsule TAKE (1) CAPSULE BY MOUTH TWICE DAILY. 638 capsule 3   folic acid (FOLVITE) 1 MG tablet Take 1 mg by mouth 2 (two) times daily.     losartan (COZAAR) 100 MG tablet TAKE ONE TABLET BY MOUTH DAILY. 90 tablet 0   metoprolol succinate (TOPROL-XL) 100 MG 24 hr tablet TAKE ONE TABLET BY MOUTH EVERY MORNING. TAKE WITH OR IMMEDIATELY FOLLOWING A MEAL. 90 tablet 0   Polyethyl Glycol-Propyl Glycol (SYSTANE OP) Place 1 drop into both eyes daily as needed (Dry eyes).     potassium chloride SA (KLOR-CON M) 20 MEQ tablet TAKE (1) TABLET BY MOUTH 3 TIMES DAILY. (Patient taking differently: 20 mEq 2 (two) times daily.) 90 tablet 2   torsemide (DEMADEX) 20 MG tablet TAKE 3 TABLETS BY MOUTH DAILY. 270 tablet 2   triamcinolone cream (KENALOG) 0.1 % Apply 1 application. topically daily as needed (itching).     No current facility-administered medications for this visit.   Allergies:  Codeine, Fentanyl, and Keflex [cephalexin]   ROS: Hearing loss.  Physical Exam: VS:  BP 128/62   Pulse 64   Ht 5' (1.524 m)   Wt 152 lb 9.6 oz (69.2 kg)   SpO2 98%   BMI 29.80 kg/m , BMI Body mass index is 29.8 kg/m.  Wt Readings from Last 3 Encounters:  10/22/21 152 lb 9.6 oz (69.2 kg)  08/06/21 159 lb (72.1 kg)  07/22/21 152 lb 6.4 oz (69.1 kg)    General: Patient appears comfortable at rest. HEENT: Conjunctiva and lids normal, oropharynx clear. Neck: Supple, no elevated JVP or carotid bruits, no thyromegaly. Lungs: Clear to auscultation, nonlabored  breathing at rest. Cardiac: Regular rate and rhythm, no S3, 1/6 systolic murmur, no pericardial rub. Extremities: No pitting edema.  ECG:  An ECG dated 08/06/2021 was personally reviewed today and demonstrated:  Dual chamber pacing.  Recent Labwork: 07/24/2021: BUN 45; Creatinine, Ser 1.83; Hemoglobin 12.6; Platelets 179; Potassium 4.6; Sodium 140     Component Value Date/Time   CHOL 111 01/24/2013 0946   TRIG 119 01/24/2013 0946   HDL 31 (L) 01/24/2013 0946   CHOLHDL 3.6 01/24/2013 0946   VLDL 24 01/24/2013 0946   LDLCALC 56 01/24/2013 0946    Other Studies Reviewed Today:  Lexiscan Myoview 09/19/2019: The study is normal. There are no perfusion defects This is a low risk study. The left ventricular ejection fraction is normal (55-65%). AV paced throughout the study  Echocardiogram 09/20/2019: 1. Left ventricular ejection fraction, by estimation, is 60 to 65%. The  left ventricle has normal function. The left ventricle has no regional  wall motion abnormalities. Left ventricular diastolic parameters are  consistent with Grade II diastolic  dysfunction (pseudonormalization). Elevated left atrial pressure.   2. Right ventricular systolic function is normal. The right ventricular  size is normal. There is moderately elevated pulmonary artery systolic  pressure.   3. Left atrial size was severely dilated.   4. Right atrial size was severely dilated.   5. The mitral valve is normal in structure. Mild mitral valve  regurgitation. No evidence of mitral stenosis.   6. The aortic valve was not well visualized. Aortic valve regurgitation  is not visualized. Mild aortic valve stenosis. Aortic valve mean gradient  measures 10.7 mmHg. Aortic valve peak gradient measures 18.7 mmHg. Aortic  valve area, by VTI measures 1.50  cm.   7. PASP is 42 mmHg, mild to moderate pulmonary HTN.   8. The inferior vena cava is normal in size with greater than 50%  respiratory variability, suggesting right  atrial pressure of 3 mmHg.   Carotid Dopplers 09/02/2020: IMPRESSION: Heterogeneous and partially calcified plaque at the bilateral carotid bifurcation contributes to 50%-69% stenosis by established duplex criteria  Assessment and Plan:  1.  Chronic shortness of breath.  Most likely multifactorial with diastolic dysfunction, some degree of pulmonary hypertension, PAF (although this is generally been well controlled on Tikosyn), and ischemic heart disease (although she had a low risk Myoview within the last 2 years).  She is considering following through with referral to [facility] pulmonary per Dr. Willey Blade.  I have been reluctant to pursue invasive cardiac testing at this point particular in light of her renal insufficiency.  We will obtain a follow-up echocardiogram but otherwise continue with present regimen for now including Norvasc, Lipitor, Cozaar, Toprol-XL, and Demadex with potassium supplement.  2.  Paroxysmal atrial fibrillation with CHA2DS2-VASc score of 5.  She has had good rhythm control, 7% rhythm burden by most recent device check.  Continue Eliquis for stroke prophylaxis as well as Toprol-XL and Tikosyn.  3.  Medtronic ICD in place with recent generator change.  She continues to follow with Dr. Lovena Le.  4.  Multivessel CAD status post CABG as well as DES to the pLAD.  No active ischemia by Myoview in 2021.  Continue medical therapy.  She is also on Lipitor.  Medication Adjustments/Labs and Tests Ordered: Current medicines are reviewed at length with the patient today.  Concerns regarding medicines are outlined above.   Tests Ordered: Orders Placed This Encounter  Procedures   ECHOCARDIOGRAM COMPLETE    Medication Changes: No orders of the defined types were placed in this encounter.   Disposition:  Follow up  6 months.  Signed, Satira Sark, MD, [facility] 10/22/2021 4:51 PM    [facility] [place], [place], [place] [address] Phone: [phone]; Fax: [phone]

## 2021-10-22 NOTE — Patient Instructions (Signed)
Medication Instructions:    Your physician recommends that you continue on your current medications as directed. Please refer to the Current Medication list given to you today.  Labwork:  None  Testing/Procedures: Your physician has requested that you have an echocardiogram. Echocardiography is a painless test that uses sound waves to create images of your heart. It provides your doctor with information about the size and shape of your heart and how well your heart's chambers and valves are working. This procedure takes approximately one hour. There are no restrictions for this procedure.  Follow-Up:  Your physician recommends that you schedule a follow-up appointment in: 6 months.  Any Other Special Instructions Will Be Listed Below (If Applicable).  If you need a refill on your cardiac medications before your next appointment, please call your pharmacy. 

## 2021-10-29 ENCOUNTER — Encounter: Payer: Self-pay | Admitting: Internal Medicine

## 2021-10-29 ENCOUNTER — Ambulatory Visit (INDEPENDENT_AMBULATORY_CARE_PROVIDER_SITE_OTHER): Payer: Medicare PPO | Admitting: Internal Medicine

## 2021-10-29 VITALS — BP 120/56 | HR 64 | Ht 60.0 in | Wt 153.6 lb

## 2021-10-29 DIAGNOSIS — I482 Chronic atrial fibrillation, unspecified: Secondary | ICD-10-CM

## 2021-10-29 NOTE — Progress Notes (Signed)
HPI Mrs. Edgecombe returns today for followup. She is a pleasant 88 yo woman with a h/o chronic systolic/diastolic heart failure, persistent atrial fib, CHB, s/p PPM, VT, s/p ICD and asthma. She has done better in the past several months. She does not feel palpitations but her symptoms are improved. No ICD shock. She has mild peripheral edema. She admits to sodium indiscretion. She has undergone ICD gen change out. In the interim, she notes chronic dyspnea, class 2.  Allergies  Allergen Reactions   Codeine Nausea And Vomiting   Fentanyl Itching    Itching, redness to scalp and neck   Keflex [Cephalexin] Itching and Rash     Current Outpatient Medications  Medication Sig Dispense Refill   acetaminophen (TYLENOL) 500 MG tablet Take 500 mg by mouth every morning.     albuterol (PROVENTIL HFA;VENTOLIN HFA) 108 (90 BASE) MCG/ACT inhaler Inhale 2 puffs into the lungs every 6 (six) hours as needed for wheezing or shortness of breath. 1 Inhaler 2   amLODipine (NORVASC) 5 MG tablet TAKE (1) TABLET BY MOUTH ONCE DAILY. 90 tablet 3   apixaban (ELIQUIS) 2.5 MG TABS tablet TAKE (1) TABLET BY MOUTH TWICE DAILY. 60 tablet 6   atorvastatin (LIPITOR) 20 MG tablet TAKE ONE TABLET BY MOUTH ONCE DAILY. 90 tablet 3   Calcium Carbonate-Vitamin D (CALCIUM 600 + D PO) Take 1 tablet by mouth 2 (two) times daily.     Cholecalciferol (VITAMIN D) 2000 UNITS CAPS Take 2,000 Units by mouth at bedtime.     Clobetasol Propionate Emulsion 0.05 % topical foam Apply 1 application. topically daily as needed (scalp and neck). Solution     dofetilide (TIKOSYN) 125 MCG capsule TAKE (1) CAPSULE BY MOUTH TWICE DAILY. 545 capsule 3   folic acid (FOLVITE) 1 MG tablet Take 1 mg by mouth 2 (two) times daily.     losartan (COZAAR) 100 MG tablet TAKE ONE TABLET BY MOUTH DAILY. 90 tablet 0   metoprolol succinate (TOPROL-XL) 100 MG 24 hr tablet TAKE ONE TABLET BY MOUTH EVERY MORNING. TAKE WITH OR IMMEDIATELY FOLLOWING A MEAL. 90 tablet  0   Polyethyl Glycol-Propyl Glycol (SYSTANE OP) Place 1 drop into both eyes daily as needed (Dry eyes).     potassium chloride SA (KLOR-CON M) 20 MEQ tablet TAKE (1) TABLET BY MOUTH 3 TIMES DAILY. (Patient taking differently: 20 mEq 2 (two) times daily.) 90 tablet 2   torsemide (DEMADEX) 20 MG tablet TAKE 3 TABLETS BY MOUTH DAILY. 270 tablet 2   triamcinolone cream (KENALOG) 0.1 % Apply 1 application. topically daily as needed (itching).     No current facility-administered medications for this visit.     Past Medical History:  Diagnosis Date   Anxiety    Chronic atrial fibrillation (HCC)    Chronic back pain    Chronic diastolic heart failure (HCC)    Chronic obstructive pulmonary disease, unspecified (HCC)    Complete heart block (HCC)    COPD (chronic obstructive pulmonary disease) (HCC)    Coronary atherosclerosis of native coronary artery    Multivessel status post CABG, DES PLA March 2006   DDD (degenerative disc disease), lumbar    Essential hypertension    Headache    ICD (implantable cardioverter-defibrillator) in place    Mixed hyperlipidemia    Osteoarthritis    Ventricular fibrillation ([place]) 2003   a. seen on PPM interrogation a/w syncope    ROS:   All systems reviewed and negative except as  noted in the HPI.   Past Surgical History:  Procedure Laterality Date   CARDIAC DEFIBRILLATOR PLACEMENT     MDT dual chamber ICD   CARDIOVERSION N/A 08/09/2014   Procedure: CARDIOVERSION;  Surgeon: Sanda Klein, MD;  Location: [facility];  Service: Cardiovascular;  Laterality: N/A;   CORONARY ARTERY BYPASS GRAFT     LIMA to LAD, SVG to diagonal, SVG to ramus and OM   ICD GENERATOR CHANGEOUT N/A 08/06/2021   Procedure: [place];  Surgeon: Evans Lance, MD;  Location: [place] [facility] LAB;  Service: Cardiovascular;  Laterality: N/A;   IMPLANTABLE CARDIOVERTER DEFIBRILLATOR GENERATOR CHANGE N/A 09/25/2013   Procedure: IMPLANTABLE CARDIOVERTER DEFIBRILLATOR  GENERATOR CHANGE;  Surgeon: Evans Lance, MD;  Location: [facility] C[facility] LAB;  Service: Cardiovascular;  Laterality: N/A;   LEAD REVISION N/A 09/29/2013   Procedure: LEAD REVISION;  Surgeon: Deboraha Sprang, MD;  Location: [facility] CATH LAB;  Service: Cardiovascular;  Laterality: N/A;   TONSILLECTOMY     YAG LASER APPLICATION Left 11/20/5051   Procedure: YAG LASER APPLICATION;  Surgeon: Elta Guadeloupe T. Gershon Crane, MD;  Location: [facility];  Service: Ophthalmology;  Laterality: Left;   YAG LASER APPLICATION Right 9/76/7341   Procedure: YAG LASER APPLICATION;  Surgeon: Elta Guadeloupe T. Gershon Crane, MD;  Location: [facility];  Service: Ophthalmology;  Laterality: Right;     Family History  Problem Relation Age of Onset   Heart attack Father    Heart attack Brother      Social History   Socioeconomic History   Marital status: Widowed    Spouse name: Not on file   Number of children: 2   Years of education: Not on file   Highest education level: Not on file  Occupational History   Occupation: Retired    Comment: Surveyor, quantity: RETIRED  Tobacco Use   Smoking status: Never   Smokeless tobacco: Never  Vaping Use   Vaping Use: Never used  Substance and Sexual Activity   Alcohol use: No    Alcohol/week: 0.0 standard drinks of alcohol   Drug use: No   Sexual activity: Never  Other Topics Concern   Not on file  Social History Narrative   Not on file   Social Determinants of Health   Financial Resource Strain: Not on file  Food Insecurity: Not on file  Transportation Needs: Not on file  Physical Activity: Not on file  Stress: Not on file  Social Connections: Not on file  Intimate Partner Violence: Not on file     Ht 5' (1.524 m)   Wt 153 lb 9.6 oz (69.7 kg)   BMI 30.00 kg/m   Physical Exam:  Well appearing NAD HEENT: Unremarkable Neck:  No JVD, no thyromegally Lymphatics:  No adenopathy Back:  No CVA tenderness Lungs:  Clear with no wheezes HEART:  Regular rate rhythm, no murmurs, no rubs,  no clicks Abd:  soft, positive bowel sounds, no organomegally, no rebound, no guarding Ext:  2 plus pulses, no edema, no cyanosis, no clubbing Skin:  No rashes no nodules Neuro:  CN II through XII intact, motor grossly intact  EKG - AV paced  DEVICE  Normal device function.  See PaceArt for details.   Assess/Plan:  1. VT - she has only had NSVT since her last visit. 2. Persistent atrial fib - her burden is much improved. She will continue dofetilide 3. CHB - she is asymptomatic, s/p DDD ICD 4. HFPEF - she has class 2  symptoms. We will follow 5. ICD - she has undergone ICD gen change out.   Carleene Overlie Garet Hooton,MD

## 2021-10-29 NOTE — Patient Instructions (Signed)
Medication Instructions:  Your physician recommends that you continue on your current medications as directed. Please refer to the Current Medication list given to you today.  *If you need a refill on your cardiac medications before your next appointment, please call your pharmacy*   Lab Work: NONE   If you have labs (blood work) drawn today and your tests are completely normal, you will receive your results only by: MyChart Message (if you have MyChart) OR A paper copy in the mail If you have any lab test that is abnormal or we need to change your treatment, we will call you to review the results.   Testing/Procedures: NONE    Follow-Up: At [facility], you and your health needs are our priority.  As part of our continuing mission to provide you with exceptional heart care, we have created designated Provider Care Teams.  These Care Teams include your primary Cardiologist (physician) and Advanced Practice Providers (APPs -  Physician Assistants and Nurse Practitioners) who all work together to provide you with the care you need, when you need it.  We recommend signing up for the patient portal called "MyChart".  Sign up information is provided on this After Visit Summary.  MyChart is used to connect with patients for Virtual Visits (Telemedicine).  Patients are able to view lab/test results, encounter notes, upcoming appointments, etc.  Non-urgent messages can be sent to your provider as well.   To learn more about what you can do with MyChart, go to [email or web address].    Your next appointment:   1 year(s)  The format for your next appointment:   In Person  Provider:   Gregg Taylor, MD    Other Instructions Thank you for choosing [facility]!    Important Information About Sugar       

## 2021-11-07 ENCOUNTER — Ambulatory Visit (INDEPENDENT_AMBULATORY_CARE_PROVIDER_SITE_OTHER): Payer: Medicare PPO

## 2021-11-07 DIAGNOSIS — I509 Heart failure, unspecified: Secondary | ICD-10-CM | POA: Diagnosis not present

## 2021-11-07 LAB — CUP PACEART REMOTE DEVICE CHECK
Battery Remaining Longevity: 80 mo
Battery Voltage: 3.01 V
Brady Statistic AP VP Percent: 64.7 %
Brady Statistic AP VS Percent: 0.01 %
Brady Statistic AS VP Percent: 35.25 %
Brady Statistic AS VS Percent: 0.05 %
Brady Statistic RA Percent Paced: 62.06 %
Brady Statistic RV Percent Paced: 99.71 %
Date Time Interrogation Session: 20230721123946
HighPow Impedance: 58 Ohm
HighPow Impedance: 79 Ohm
Implantable Lead Implant Date: 20030823
Implantable Lead Implant Date: 20030823
Implantable Lead Location: 753859
Implantable Lead Location: 753860
Implantable Lead Model: 157
Implantable Lead Model: 4086
Implantable Lead Serial Number: 108215
Implantable Lead Serial Number: 112190
Implantable Pulse Generator Implant Date: 20230419
Lead Channel Impedance Value: 323 Ohm
Lead Channel Impedance Value: 323 Ohm
Lead Channel Impedance Value: 437 Ohm
Lead Channel Pacing Threshold Amplitude: 1.375 V
Lead Channel Pacing Threshold Pulse Width: 0.4 ms
Lead Channel Sensing Intrinsic Amplitude: 1.875 mV
Lead Channel Sensing Intrinsic Amplitude: 1.875 mV
Lead Channel Sensing Intrinsic Amplitude: 11.75 mV
Lead Channel Sensing Intrinsic Amplitude: 11.75 mV
Lead Channel Setting Pacing Amplitude: 2 V
Lead Channel Setting Pacing Amplitude: 2.75 V
Lead Channel Setting Pacing Pulse Width: 0.4 ms
Lead Channel Setting Sensing Sensitivity: 0.3 mV

## 2021-11-10 ENCOUNTER — Ambulatory Visit (HOSPITAL_COMMUNITY)
Admission: RE | Admit: 2021-11-10 | Discharge: 2021-11-10 | Disposition: A | Discharge status: Home / Self Care | Payer: Medicare PPO | Source: Ambulatory Visit | Attending: Cardiology | Admitting: Cardiology

## 2021-11-10 DIAGNOSIS — R0602 Shortness of breath: Secondary | ICD-10-CM | POA: Diagnosis not present

## 2021-11-10 NOTE — Progress Notes (Signed)
*  PRELIMINARY RESULTS* Echocardiogram 2D Echocardiogram has been performed.  [name] [name] 11/10/2021, 4:09 PM

## 2021-11-12 ENCOUNTER — Ambulatory Visit: Payer: Medicare PPO | Admitting: Cardiology

## 2021-11-19 ENCOUNTER — Telehealth: Payer: Self-pay | Admitting: Cardiology

## 2021-11-19 NOTE — Telephone Encounter (Signed)
Patient returning call.

## 2021-11-19 NOTE — Telephone Encounter (Signed)
    Covering for Dr. Domenic Polite - Please let the patient know we apologize for the delay in her report as it was read that day by Dr. Domenic Polite but did not crossover into the system. We were able to pull up the report and this shows a preserved ejection fraction of 60 to 65% with no wall motion abnormalities. She did have slightly abnormal relaxation of the heart and good blood pressure control is essential. Her right ventricle was pumping normally and she only had mildly elevated pulmonary pressures. She had mild leakage along the mitral valve and mild to moderate aortic valve stenosis. We will continue to follow her aortic stenosis over time but this is overall similar to prior imaging from 2021.  Signed, Erma Heritage, PA-C 11/19/2021, 4:20 PM

## 2021-11-19 NOTE — Telephone Encounter (Signed)
Called, no answer. Unable to leave message.

## 2021-11-19 NOTE — Telephone Encounter (Signed)
Patient made aware, verbalized understanding

## 2021-11-19 NOTE — Telephone Encounter (Signed)
Patient calling in to get her results. Please advise

## 2021-11-21 NOTE — Progress Notes (Signed)
Remote ICD transmission.   

## 2021-12-05 ENCOUNTER — Other Ambulatory Visit: Payer: Self-pay | Admitting: Cardiology

## 2021-12-08 DIAGNOSIS — I700 Atherosclerosis of aorta: Secondary | ICD-10-CM | POA: Diagnosis not present

## 2021-12-08 DIAGNOSIS — Z79899 Other long term (current) drug therapy: Secondary | ICD-10-CM | POA: Diagnosis not present

## 2021-12-08 DIAGNOSIS — N1832 Chronic kidney disease, stage 3b: Secondary | ICD-10-CM | POA: Diagnosis not present

## 2021-12-08 DIAGNOSIS — I5030 Unspecified diastolic (congestive) heart failure: Secondary | ICD-10-CM | POA: Diagnosis not present

## 2021-12-15 DIAGNOSIS — N1832 Chronic kidney disease, stage 3b: Secondary | ICD-10-CM | POA: Diagnosis not present

## 2021-12-15 DIAGNOSIS — I5030 Unspecified diastolic (congestive) heart failure: Secondary | ICD-10-CM | POA: Diagnosis not present

## 2022-01-01 ENCOUNTER — Other Ambulatory Visit: Payer: Self-pay | Admitting: Cardiology

## 2022-01-21 ENCOUNTER — Telehealth: Payer: Self-pay

## 2022-01-21 NOTE — Telephone Encounter (Signed)
Patient called reporting of palpitations x2days. Denies chest pain, dizziness or lightehaded. Reports of history of chronic shortness of breath which is not new and no recent changes with breathing per patient. States she was advised to f/u with pulmonologist but never did. Patient denies any changes in medications, recent illness or other complaints that she could think of. Remote transmission received and reviewed. Normal device function. Presenting rhythm appears ST 127 bpm. 2 NSVT events logged which were both <20 beats in duration and last one was 12/24/21.   Spoke to [place] and states she will review with Dr. Lovena Le first thing in the morning and follow up with patient. Advised pt to continue all medications and ED precautions given with verbal understanding.

## 2022-01-21 NOTE — Telephone Encounter (Signed)
Patient called in stating her heart rate has been in the 130's with some sob. She thinks its tachycardia, patient is sending a remote transmission to see if anything was seen. This has been going on 2 days and lasting hours at a time. Patient denies any chest pains at this time

## 2022-01-22 NOTE — Telephone Encounter (Signed)
Discussed with Dr. Lovena Le.  Per Dr. Estanislado Pandy Pt continues to have tachycardia she can take an extra metoprolol.  Outreach made to Pt.  Per Pt her heart rate slowed at about midnight last night.  Current heart rate is in the 70's.  No action needed.  She will call if she notices her heart rate to be elevated again.

## 2022-02-06 ENCOUNTER — Ambulatory Visit (INDEPENDENT_AMBULATORY_CARE_PROVIDER_SITE_OTHER): Payer: Medicare PPO

## 2022-02-06 DIAGNOSIS — I509 Heart failure, unspecified: Secondary | ICD-10-CM | POA: Diagnosis not present

## 2022-02-06 LAB — CUP PACEART REMOTE DEVICE CHECK
Battery Remaining Longevity: 88 mo
Battery Voltage: 3 V
Brady Statistic AP VP Percent: 40.76 %
Brady Statistic AP VS Percent: 0.02 %
Brady Statistic AS VP Percent: 59.16 %
Brady Statistic AS VS Percent: 0.06 %
Brady Statistic RA Percent Paced: 38.43 %
Brady Statistic RV Percent Paced: 99.82 %
Date Time Interrogation Session: 20231020061717
HighPow Impedance: 59 Ohm
HighPow Impedance: 79 Ohm
Implantable Lead Implant Date: 20030823
Implantable Lead Implant Date: 20030823
Implantable Lead Location: 753859
Implantable Lead Location: 753860
Implantable Lead Model: 157
Implantable Lead Model: 4086
Implantable Lead Serial Number: 108215
Implantable Lead Serial Number: 112190
Implantable Pulse Generator Implant Date: 20230419
Lead Channel Impedance Value: 323 Ohm
Lead Channel Impedance Value: 342 Ohm
Lead Channel Impedance Value: 437 Ohm
Lead Channel Pacing Threshold Amplitude: 1.25 V
Lead Channel Pacing Threshold Pulse Width: 0.4 ms
Lead Channel Sensing Intrinsic Amplitude: 1.5 mV
Lead Channel Sensing Intrinsic Amplitude: 1.5 mV
Lead Channel Sensing Intrinsic Amplitude: 11.75 mV
Lead Channel Sensing Intrinsic Amplitude: 11.75 mV
Lead Channel Setting Pacing Amplitude: 2 V
Lead Channel Setting Pacing Amplitude: 2.5 V
Lead Channel Setting Pacing Pulse Width: 0.4 ms
Lead Channel Setting Sensing Sensitivity: 0.3 mV

## 2022-02-12 NOTE — Progress Notes (Signed)
Remote ICD transmission.   

## 2022-03-03 DIAGNOSIS — I4891 Unspecified atrial fibrillation: Secondary | ICD-10-CM | POA: Diagnosis not present

## 2022-03-03 DIAGNOSIS — Z79899 Other long term (current) drug therapy: Secondary | ICD-10-CM | POA: Diagnosis not present

## 2022-03-03 DIAGNOSIS — N1832 Chronic kidney disease, stage 3b: Secondary | ICD-10-CM | POA: Diagnosis not present

## 2022-03-03 DIAGNOSIS — I10 Essential (primary) hypertension: Secondary | ICD-10-CM | POA: Diagnosis not present

## 2022-03-03 DIAGNOSIS — I5030 Unspecified diastolic (congestive) heart failure: Secondary | ICD-10-CM | POA: Diagnosis not present

## 2022-03-04 ENCOUNTER — Other Ambulatory Visit: Payer: Self-pay | Admitting: Cardiology

## 2022-03-10 DIAGNOSIS — I10 Essential (primary) hypertension: Secondary | ICD-10-CM | POA: Diagnosis not present

## 2022-03-10 DIAGNOSIS — I700 Atherosclerosis of aorta: Secondary | ICD-10-CM | POA: Diagnosis not present

## 2022-03-10 DIAGNOSIS — N1831 Chronic kidney disease, stage 3a: Secondary | ICD-10-CM | POA: Diagnosis not present

## 2022-03-10 DIAGNOSIS — E785 Hyperlipidemia, unspecified: Secondary | ICD-10-CM | POA: Diagnosis not present

## 2022-03-10 DIAGNOSIS — I2510 Atherosclerotic heart disease of native coronary artery without angina pectoris: Secondary | ICD-10-CM | POA: Diagnosis not present

## 2022-03-10 DIAGNOSIS — I480 Paroxysmal atrial fibrillation: Secondary | ICD-10-CM | POA: Diagnosis not present

## 2022-03-10 DIAGNOSIS — I5030 Unspecified diastolic (congestive) heart failure: Secondary | ICD-10-CM | POA: Diagnosis not present

## 2022-03-13 ENCOUNTER — Other Ambulatory Visit: Payer: Self-pay | Admitting: Cardiology

## 2022-03-16 NOTE — Telephone Encounter (Signed)
Prescription refill request for Eliquis received. Indication: AF Last office visit: 10/29/21  Beckie Salts MD Scr: 1.83 on 07/24/21 Age: 88 Weight: 69.7kg  Based on above findings Eliquis 2.'5mg'$  twice daily is the appropriate dose.  Refill approved.

## 2022-03-26 ENCOUNTER — Ambulatory Visit: Payer: Medicare PPO | Admitting: Orthopedic Surgery

## 2022-03-31 ENCOUNTER — Other Ambulatory Visit: Payer: Self-pay | Admitting: Cardiology

## 2022-04-10 ENCOUNTER — Other Ambulatory Visit: Payer: Self-pay | Admitting: Cardiology

## 2022-04-16 ENCOUNTER — Ambulatory Visit: Payer: Medicare PPO | Admitting: Orthopedic Surgery

## 2022-04-23 ENCOUNTER — Ambulatory Visit: Payer: Medicare PPO | Admitting: Internal Medicine

## 2022-04-23 ENCOUNTER — Encounter: Payer: Self-pay | Admitting: Internal Medicine

## 2022-04-23 ENCOUNTER — Ambulatory Visit (HOSPITAL_COMMUNITY)
Admission: RE | Admit: 2022-04-23 | Discharge: 2022-04-23 | Disposition: A | Discharge status: Home / Self Care | Payer: Medicare PPO | Source: Ambulatory Visit | Attending: Internal Medicine | Admitting: Internal Medicine

## 2022-04-23 VITALS — BP 132/84 | HR 88 | Temp 97.7°F | Ht 60.0 in | Wt 154.0 lb

## 2022-04-23 DIAGNOSIS — R0609 Other forms of dyspnea: Secondary | ICD-10-CM

## 2022-04-23 DIAGNOSIS — R0902 Hypoxemia: Secondary | ICD-10-CM

## 2022-04-23 DIAGNOSIS — R0602 Shortness of breath: Secondary | ICD-10-CM | POA: Diagnosis not present

## 2022-04-23 MED ORDER — BISOPROLOL FUMARATE 10 MG PO TABS: 10.0000 mg | ORAL_TABLET | Freq: Every day | ORAL | 11 refills | Status: DC

## 2022-04-23 NOTE — Progress Notes (Signed)
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 Etiology  with prior pfts 2016 showing mild aiflow obst not better on saba.  Pt unsure of baseline wt   Reported sob only with severe exertion at pfts 2016    History of Present Illness  04/23/2022  Pulmonary/ 1st office eval/ Aldo [facility] / [facility]  Chief Complaint  Patient presents with   Consult    SOB- O2 desaturations.  Patient was 87% room air after walking from lobby to room but stated she did not want to be on O2   Dyspnea:  100 ft / can still push cart at [employer]/ mb is 75 ft slt down to it.  Cough: started one day prior to OV  / minimal mucoid not noct  Sleep: flat in bed one pillow ok  SABA use: once a day but very poor technique  02: does not use it    No obvious day to day or daytime pattern/variability or assoc excess/ purulent sputum or mucus plugs or hemoptysis or cp or chest tightness, subjective wheeze or overt sinus or hb symptoms.   Sleeping  without nocturnal  or early am exacerbation  of respiratory  c/o's or need for noct saba. Also denies any obvious fluctuation of symptoms with weather or environmental changes or other aggravating or alleviating factors except as outlined above   No unusual exposure hx or h/o childhood pna/ asthma or knowledge of premature birth.  Current Allergies, Complete Past Medical History, Past Surgical History, Family History, and Social History were reviewed in [employer] record.  ROS  The following are not active complaints unless bolded Hoarseness, sore throat, dysphagia, dental problems, itching, sneezing,  nasal congestion or discharge of excess mucus or purulent secretions, ear ache,   fever, chills, sweats, unintended wt loss or wt gain, classically pleuritic or exertional cp,   orthopnea pnd or arm/hand swelling  or leg swelling, presyncope, palpitations, abdominal pain, anorexia, nausea, vomiting, diarrhea  or change in bowel habits or change in bladder habits, change in stools or change in urine, dysuria, hematuria,  rash, arthralgias, visual complaints, headache, numbness, weakness or ataxia or problems with walking or coordination,  change in mood or  memory.              Past Medical History:  Diagnosis Date   Anxiety    Chronic atrial fibrillation (HCC)    Chronic back pain    Chronic diastolic heart failure (HCC)    Chronic obstructive pulmonary disease, unspecified (HCC)    Complete heart block (HCC)    COPD (chronic obstructive pulmonary disease) (HCC)    Coronary atherosclerosis of native coronary artery    Multivessel status post CABG, DES PLA March 2006   DDD (degenerative disc disease), lumbar    Essential hypertension    Headache    ICD (implantable cardioverter-defibrillator) in place    Mixed hyperlipidemia    Osteoarthritis    Ventricular fibrillation ([place]) 2003   a. seen on PPM interrogation a/w syncope    Outpatient Medications Prior to Visit  Medication Sig Dispense Refill   acetaminophen (TYLENOL) 500 MG tablet Take 500 mg by mouth every morning.     albuterol (PROVENTIL HFA;VENTOLIN HFA) 108 (90 BASE) MCG/ACT inhaler Inhale  2 puffs into the lungs every 6 (six) hours as needed for wheezing or shortness of breath. 1 Inhaler 2   amLODipine (NORVASC) 5 MG tablet TAKE (1) TABLET BY MOUTH ONCE DAILY. 90 tablet 3   apixaban (ELIQUIS) 2.5 MG TABS tablet TAKE (1) TABLET BY MOUTH TWICE DAILY. 60 tablet 5   atorvastatin (LIPITOR) 20 MG tablet TAKE ONE TABLET BY MOUTH ONCE DAILY. 90 tablet 3   Calcium Carbonate-Vitamin D (CALCIUM 600 + D PO) Take 1 tablet by mouth 2 (two) times daily.     Cholecalciferol (VITAMIN D) 2000 UNITS CAPS Take 2,000 Units by mouth at bedtime.     Clobetasol Propionate Emulsion 0.05 % topical foam Apply 1 application.  topically daily as needed (scalp and neck). Solution     dofetilide (TIKOSYN) 125 MCG capsule TAKE (1) CAPSULE BY MOUTH TWICE DAILY. 213 capsule 3   folic acid (FOLVITE) 1 MG tablet Take 1 mg by mouth 2 (two) times daily.     losartan (COZAAR) 100 MG tablet TAKE (1) TABLET BY MOUTH DAILY. 90 tablet 3   Polyethyl Glycol-Propyl Glycol (SYSTANE OP) Place 1 drop into both eyes daily as needed (Dry eyes).     potassium chloride SA (KLOR-CON M) 20 MEQ tablet TAKE (1) TABLET BY MOUTH 3 TIMES DAILY. 270 tablet 3   torsemide (DEMADEX) 20 MG tablet TAKE 3 TABLETS BY MOUTH DAILY. 270 tablet 3   triamcinolone cream (KENALOG) 0.1 % Apply 1 application. topically daily as needed (itching).     metoprolol succinate (TOPROL-XL) 100 MG 24 hr tablet TAKE ONE TABLET BY MOUTH EVERY MORNING. TAKE WITH OR IMMEDIATELY FOLLOWING A MEAL. 90 tablet 3   No facility-administered medications prior to visit.     Objective:     BP 132/84   Pulse 88   Temp 97.7 F (36.5 C)   Ht 5' (1.524 m)   Wt 154 lb (69.9 kg)   SpO2 91% Comment: ra at rest after approx. 1 min  BMI 30.08 kg/m   SpO2: 91 % (ra at rest after approx. 1 min)  Obese pleasant amb wf who failed to answer questions asked in a straightforward manner, tending to go off on tangents    HEENT : Oropharynx clear        NECK :  without  apparent JVD/ palpable Nodes/TM    LUNGS: no acc muscle use,  Nl contour chest with distant bs bilaterally without cough on insp or exp maneuvers   CV:  RRR  no s3 or murmur or increase in P2, and no edema   ABD:  soft and nontender with nl inspiratory excursion in the supine position. No bruits or organomegaly appreciated   MS:  Nl gait/ ext warm without deformities Or obvious joint restrictions  calf tenderness, cyanosis or clubbing    SKIN: warm and dry without lesions    NEURO:  alert, approp, nl sensorium with  no motor or cerebellar deficits apparent.     Labs ordered/ reviewed:      Chemistry       Component Value Date/Time   NA 144 04/23/2022 1612   K 4.6 04/23/2022 1612   CL 99 04/23/2022 1612   CO2 27 04/23/2022 1612   BUN 38 (H) 04/23/2022 1612   CREATININE 1.32 (H) 04/23/2022 1612   CREATININE 0.99 (H) 02/06/2015 1052      Component Value Date/Time   CALCIUM 10.2 04/23/2022 1612   ALKPHOS 36 (L) 05/05/2017 0528   AST 28 05/05/2017  0528   ALT 24 05/05/2017 0528   BILITOT 0.8 05/05/2017 0528        Lab Results  Component Value Date   WBC 9.2 04/23/2022   HGB 13.1 04/23/2022   HCT 40.0 04/23/2022   MCV 96 04/23/2022   PLT 211 04/23/2022       EOS                                                              0.2                                   04/23/2022     IgE     04/23/2022   =   7    Lab Results  Component Value Date   DDIMER <0.27 04/11/2013      Lab Results  Component Value Date   TSH 2.810 04/23/2022     BNP    04/23/2022    = 351    Lab Results  Component Value Date   ESRSEDRATE 16 04/23/2022     CXR PA and Lateral:   04/23/2022 :    I personally reviewed images and agree with radiology impression as follows:    Chronic bronchitic changes with stable medial RIGHT lung base scarring. No acute abnormalities. My review:  mod CM/ mild T kyphosis        Assessment   DOE (dyspnea on exertion) Onset was "with heart problems"  but improved p cabg / rehab  - never smoker - Echo 09/20/12 mod diastolic dysfunction  - PFT's  11/07/14  FEV1 1.14 (69 % ) ratio 0.68  p 8 % improvement from saba p nothing prior to study with DLCO  10.72 (53%)   and FV curve mildly concave    - desat with walking 04/23/2022  see ex hypoxemia  DDX of  difficult airways management almost all start with A and  include Adherence, Ace Inhibitors, Acid Reflux, Active Sinus Disease, Alpha 1 Antitripsin deficiency, Anxiety masquerading as Airways dz,  ABPA,  Allergy(esp in young), Aspiration (esp in elderly), Adverse effects of meds,  Active smoking or vaping, A bunch of PE's (a small  clot burden can't cause this syndrome unless there is already severe underlying pulm or vascular dz with poor reserve) plus two Bs  = Bronchiectasis and Beta blocker use..and one C= CHF   Adherence is always the initial "prime suspect" and is a multilayered concern that requires a "trust but verify" approach in every patient - starting with knowing how to use medications, especially inhalers, correctly, keeping up with refills and understanding the fundamental difference between maintenance and prns vs those medications only taken for a very short course and then stopped and not refilled.  - hfa teaching done  ? Allergy/asthma > allergy screen neg/ min prior response to saba but worth rechallenging: Re SABA :  I spent extra time with pt today reviewing appropriate use of albuterol for prn use on exertion with the following points: 1) saba is for relief of sob that does not improve by walking a slower pace or resting but rather if the pt does not improve after trying this first. 2) If the pt is convinced, as  many are, that saba helps recover from activity faster then it's easy to tell if this is the case by re-challenging : ie stop, take the inhaler, then p 5 minutes try the exact same activity (intensity of workload) that just caused the symptoms and see if they are substantially diminished or not after saba 3) if there is an activity that reproducibly causes the symptoms, try the saba 15 min before the activity on alternate days   If in fact the saba really does help, then fine to continue to use it prn but advised may need to look closer at the maintenance regimen being used to achieve better control of airways disease with exertion.   ? A bunch of PEs > very unlikely while on eliquis  ? Anxiety/depression/ deconditioning  > usually at the bottom of this list of usual suspects but  and may interfere with adherence and also interpretation of response or lack thereof to symptom management which can be  quite subjective.    ? BB effects: In the setting of respiratory symptoms of unknown etiology,  It would be preferable to use bystolic, the most beta -1  selective Beta blocker available in sample form, with bisoprolol the most selective generic choice  on the market, at least on a trial basis, to make sure the spillover Beta 2 effects of the less specific Beta blockers are not contributing to this patient's symptoms.    ? CHF/ diastolic dysfunction > rx per cards    F/u with full pfts next avail     Exercise hypoxemia 04/23/2022  desat to 84% p 150 ft and declined repeating study on amb 02   For now declines using 02 with exertion/ strongly advised to reconsider with goal to keep satsa > 90% at all times if for now other reason than to reduce stress on heart    Each maintenance medication was reviewed in detail including emphasizing most importantly the difference between maintenance and prns and under what circumstances the prns are to be triggered using an action plan format where appropriate.  Total time for H and P, chart review, counseling, reviewing hfa  device(s) , directly observing portions of ambulatory 02 saturation study/ and generating customized AVS unique to this office visit / same day charting = 61 min new pt eval                   Christinia Gully, MD 04/23/2022

## 2022-04-23 NOTE — Progress Notes (Signed)
Patient refused oxygen at end of todays ov. Patient states she wants to wait until her other test results come back before agreeing to O2.

## 2022-04-23 NOTE — Patient Instructions (Signed)
Stop lopressor and start biosoprolol 10 mg daily to see what difference this makes  Work on inhaler technique:  relax and gently blow all the way out then take a nice smooth full deep breath back in, triggering the inhaler at same time you start breathing in.  Hold breath in for at least  5 seconds if you can.    Only use your albuterol as a rescue medication to be used if you can't catch your breath by resting or doing a relaxed purse lip breathing pattern.  - The less you use it, the better it will work when you need it. - Ok to use up to 2 puffs  every 4 hours if you must but call for immediate appointment if use goes up over your usual need - Don't leave home without it !!  (think of it like the spare tire for your car)   Also  Ok to try albuterol 15 min before an activity (on alternating days)  that you know would usually make you short of breath and see if it makes any difference and if makes none then don't take albuterol after activity unless you can't catch your breath as this means it's the resting that helps, not the albuterol.   Follow up in [place] office with PFT s same day

## 2022-04-27 LAB — SEDIMENTATION RATE: Sed Rate: 16 mm/hr (ref 0–40)

## 2022-04-27 LAB — CBC WITH DIFFERENTIAL/PLATELET
Basophils Absolute: 0.1 10*3/uL (ref 0.0–0.2)
Basos: 1 %
EOS (ABSOLUTE): 0.2 10*3/uL (ref 0.0–0.4)
Eos: 2 %
Hematocrit: 40 % (ref 34.0–46.6)
Hemoglobin: 13.1 g/dL (ref 11.1–15.9)
Immature Grans (Abs): 0 10*3/uL (ref 0.0–0.1)
Immature Granulocytes: 0 %
Lymphocytes Absolute: 1.8 10*3/uL (ref 0.7–3.1)
Lymphs: 19 %
MCH: 31.3 pg (ref 26.6–33.0)
MCHC: 32.8 g/dL (ref 31.5–35.7)
MCV: 96 fL (ref 79–97)
Monocytes Absolute: 1.6 10*3/uL — ABNORMAL HIGH (ref 0.1–0.9)
Monocytes: 18 %
Neutrophils Absolute: 5.5 10*3/uL (ref 1.4–7.0)
Neutrophils: 60 %
Platelets: 211 10*3/uL (ref 150–450)
RBC: 4.19 x10E6/uL (ref 3.77–5.28)
RDW: 12 % (ref 11.7–15.4)
WBC: 9.2 10*3/uL (ref 3.4–10.8)

## 2022-04-27 LAB — BASIC METABOLIC PANEL
BUN/Creatinine Ratio: 29 — ABNORMAL HIGH (ref 12–28)
BUN: 38 mg/dL — ABNORMAL HIGH (ref 8–27)
CO2: 27 mmol/L (ref 20–29)
Calcium: 10.2 mg/dL (ref 8.7–10.3)
Chloride: 99 mmol/L (ref 96–106)
Creatinine, Ser: 1.32 mg/dL — ABNORMAL HIGH (ref 0.57–1.00)
Glucose: 93 mg/dL (ref 70–99)
Potassium: 4.6 mmol/L (ref 3.5–5.2)
Sodium: 144 mmol/L (ref 134–144)
eGFR: 39 mL/min/{1.73_m2} — ABNORMAL LOW (ref >59)

## 2022-04-27 LAB — BRAIN NATRIURETIC PEPTIDE: BNP: 350.9 pg/mL — ABNORMAL HIGH (ref 0.0–100.0)

## 2022-04-27 LAB — IGE: IgE (Immunoglobulin E), Serum: 7 IU/mL (ref 6–495)

## 2022-04-27 LAB — TSH: TSH: 2.81 u[IU]/mL (ref 0.450–4.500)

## 2022-04-28 ENCOUNTER — Encounter: Payer: Self-pay | Admitting: Internal Medicine

## 2022-04-28 DIAGNOSIS — R0902 Hypoxemia: Secondary | ICD-10-CM | POA: Insufficient documentation

## 2022-04-28 NOTE — Assessment & Plan Note (Addendum)
Onset was "with heart problems"  but improved p cabg / rehab  - never smoker - Echo 12/25/65 mod diastolic dysfunction  - PFT's  11/07/14  FEV1 1.14 (69 % ) ratio 0.68  p 8 % improvement from saba p nothing prior to study with DLCO  10.72 (53%)   and FV curve mildly concave    - desat with walking 04/23/2022  see ex hypoxemia  DDX of  difficult airways management almost all start with A and  include Adherence, Ace Inhibitors, Acid Reflux, Active Sinus Disease, Alpha 1 Antitripsin deficiency, Anxiety masquerading as Airways dz,  ABPA,  Allergy(esp in young), Aspiration (esp in elderly), Adverse effects of meds,  Active smoking or vaping, A bunch of PE's (a small clot burden can't cause this syndrome unless there is already severe underlying pulm or vascular dz with poor reserve) plus two Bs  = Bronchiectasis and Beta blocker use..and one C= CHF   Adherence is always the initial "prime suspect" and is a multilayered concern that requires a "trust but verify" approach in every patient - starting with knowing how to use medications, especially inhalers, correctly, keeping up with refills and understanding the fundamental difference between maintenance and prns vs those medications only taken for a very short course and then stopped and not refilled.  - hfa teaching done  ? Allergy/asthma > allergy screen neg/ min prior response to saba but worth rechallenging: Re SABA :  I spent extra time with pt today reviewing appropriate use of albuterol for prn use on exertion with the following points: 1) saba is for relief of sob that does not improve by walking a slower pace or resting but rather if the pt does not improve after trying this first. 2) If the pt is convinced, as many are, that saba helps recover from activity faster then it's easy to tell if this is the case by re-challenging : ie stop, take the inhaler, then p 5 minutes try the exact same activity (intensity of workload) that just caused the symptoms and  see if they are substantially diminished or not after saba 3) if there is an activity that reproducibly causes the symptoms, try the saba 15 min before the activity on alternate days   If in fact the saba really does help, then fine to continue to use it prn but advised may need to look closer at the maintenance regimen being used to achieve better control of airways disease with exertion.   ? A bunch of PEs > very unlikely while on eliquis  ? Anxiety/depression/ deconditioning  > usually at the bottom of this list of usual suspects but  and may interfere with adherence and also interpretation of response or lack thereof to symptom management which can be quite subjective.    ? BB effects: In the setting of respiratory symptoms of unknown etiology,  It would be preferable to use bystolic, the most beta -1  selective Beta blocker available in sample form, with bisoprolol the most selective generic choice  on the market, at least on a trial basis, to make sure the spillover Beta 2 effects of the less specific Beta blockers are not contributing to this patient's symptoms.    ? CHF/ diastolic dysfunction > rx per cards    F/u with full pfts next avail

## 2022-04-28 NOTE — Assessment & Plan Note (Signed)
04/23/2022  desat to 84% p 150 ft and declined repeating study on amb 02   For now declines using 02 with exertion/ strongly advised to reconsider with goal to keep satsa > 90% at all times if for now other reason than to reduce stress on heart    Each maintenance medication was reviewed in detail including emphasizing most importantly the difference between maintenance and prns and under what circumstances the prns are to be triggered using an action plan format where appropriate.  Total time for H and P, chart review, counseling, reviewing hfa  device(s) , directly observing portions of ambulatory 02 saturation study/ and generating customized AVS unique to this office visit / same day charting = 61 min new pt eval

## 2022-04-29 ENCOUNTER — Other Ambulatory Visit: Payer: Self-pay | Admitting: Internal Medicine

## 2022-04-29 ENCOUNTER — Ambulatory Visit: Payer: Medicare PPO | Attending: Cardiology | Admitting: Cardiology

## 2022-04-29 ENCOUNTER — Encounter: Payer: Self-pay | Admitting: Cardiology

## 2022-04-29 VITALS — BP 128/62 | HR 68 | Ht 60.0 in | Wt 156.6 lb

## 2022-04-29 DIAGNOSIS — Z95810 Presence of automatic (implantable) cardiac defibrillator: Secondary | ICD-10-CM

## 2022-04-29 DIAGNOSIS — I25119 Atherosclerotic heart disease of native coronary artery with unspecified angina pectoris: Secondary | ICD-10-CM | POA: Diagnosis not present

## 2022-04-29 DIAGNOSIS — I480 Paroxysmal atrial fibrillation: Secondary | ICD-10-CM

## 2022-04-29 NOTE — Patient Instructions (Signed)
Medication Instructions:  Your physician recommends that you continue on your current medications as directed. Please refer to the Current Medication list given to you today.  *If you need a refill on your cardiac medications before your next appointment, please call your pharmacy*   Lab Work: NONE   If you have labs (blood work) drawn today and your tests are completely normal, you will receive your results only by: MyChart Message (if you have MyChart) OR A paper copy in the mail If you have any lab test that is abnormal or we need to change your treatment, we will call you to review the results.   Testing/Procedures: NONE    Follow-Up: At [facility], you and your health needs are our priority.  As part of our continuing mission to provide you with exceptional heart care, we have created designated Provider Care Teams.  These Care Teams include your primary Cardiologist (physician) and Advanced Practice Providers (APPs -  Physician Assistants and Nurse Practitioners) who all work together to provide you with the care you need, when you need it.  We recommend signing up for the patient portal called "MyChart".  Sign up information is provided on this After Visit Summary.  MyChart is used to connect with patients for Virtual Visits (Telemedicine).  Patients are able to view lab/test results, encounter notes, upcoming appointments, etc.  Non-urgent messages can be sent to your provider as well.   To learn more about what you can do with MyChart, go to [email or web address].    Your next appointment:   6 month(s)  Provider:   Samuel McDowell, MD    Other Instructions Thank you for choosing [facility]!    

## 2022-04-29 NOTE — Progress Notes (Signed)
Cardiology Office Note  Date: 04/29/2022   ID: Gizel, Riedlinger 1931/09/13, MRN [id number]  PCP:  Asencion Noble, MD  Cardiologist:  Rozann Lesches, MD Electrophysiologist:  [name], MD   Chief Complaint  Patient presents with   Cardiac follow-up    History of Present Illness: [name] [name] is an 89 y.o. female last seen in July 2023.  She is here today with her son for a follow-up visit.  Reports dyspnea on exertion as before.  I did review the recent office note from Dr. Melvyn Novas.  She was found to have ambulatory hypoxemia with oxygen desaturation down to 84%, oxygen therapy recommended but she declined at least initially and is still considering it.  Follow-up PFTs also planned.  I did review her cardiac medications which are noted below.  She reports occasional sense of palpitations.  No angina.  Remains on stable diuretic regimen with no substantial weight gain.  Medtronic ICD in place with follow-up by Dr. Lovena Le.  Device interrogation in October 2023 showed 1.6% AF burden.  Not describe any spontaneous bleeding problems on Eliquis.  I reviewed her recent lab work.  Past Medical History:  Diagnosis Date   Anxiety    Chronic atrial fibrillation (HCC)    Chronic back pain    Chronic diastolic heart failure (HCC)    Chronic obstructive pulmonary disease, unspecified (HCC)    Complete heart block (HCC)    COPD (chronic obstructive pulmonary disease) (HCC)    Coronary atherosclerosis of native coronary artery    Multivessel status post CABG, DES PLA March 2006   DDD (degenerative disc disease), lumbar    Essential hypertension    Headache    ICD (implantable cardioverter-defibrillator) in place    Mixed hyperlipidemia    Osteoarthritis    Ventricular fibrillation ([place]) 2003   a. seen on PPM interrogation a/w syncope    Current Outpatient Medications  Medication Sig Dispense Refill   acetaminophen (TYLENOL) 500 MG tablet Take 500 mg by mouth every morning.      albuterol (PROVENTIL HFA;VENTOLIN HFA) 108 (90 BASE) MCG/ACT inhaler Inhale 2 puffs into the lungs every 6 (six) hours as needed for wheezing or shortness of breath. 1 Inhaler 2   amLODipine (NORVASC) 5 MG tablet TAKE (1) TABLET BY MOUTH ONCE DAILY. 90 tablet 3   apixaban (ELIQUIS) 2.5 MG TABS tablet TAKE (1) TABLET BY MOUTH TWICE DAILY. 60 tablet 5   atorvastatin (LIPITOR) 20 MG tablet TAKE ONE TABLET BY MOUTH ONCE DAILY. 90 tablet 3   bisoprolol (ZEBETA) 10 MG tablet Take 1 tablet (10 mg total) by mouth daily. 30 tablet 11   Calcium Carbonate-Vitamin D (CALCIUM 600 + D PO) Take 1 tablet by mouth 2 (two) times daily.     Cholecalciferol (VITAMIN D) 2000 UNITS CAPS Take 2,000 Units by mouth at bedtime.     Clobetasol Propionate Emulsion 0.05 % topical foam Apply 1 application. topically daily as needed (scalp and neck). Solution     dofetilide (TIKOSYN) 125 MCG capsule TAKE (1) CAPSULE BY MOUTH TWICE DAILY. 979 capsule 0   folic acid (FOLVITE) 1 MG tablet Take 1 mg by mouth 2 (two) times daily.     losartan (COZAAR) 100 MG tablet TAKE (1) TABLET BY MOUTH DAILY. 90 tablet 3   Polyethyl Glycol-Propyl Glycol (SYSTANE OP) Place 1 drop into both eyes daily as needed (Dry eyes).     potassium chloride SA (KLOR-CON M) 20 MEQ tablet TAKE (1) TABLET BY MOUTH  3 TIMES DAILY. 270 tablet 3   torsemide (DEMADEX) 20 MG tablet TAKE 3 TABLETS BY MOUTH DAILY. 270 tablet 3   triamcinolone cream (KENALOG) 0.1 % Apply 1 application. topically daily as needed (itching).     No current facility-administered medications for this visit.   Allergies:  Codeine, Fentanyl, and Keflex [cephalexin]   ROS: No syncope.  Physical Exam: VS:  BP 128/62   Pulse 68   Ht 5' (1.524 m)   Wt 156 lb 9.6 oz (71 kg)   SpO2 95%   BMI 30.58 kg/m , BMI Body mass index is 30.58 kg/m.  Wt Readings from Last 3 Encounters:  04/29/22 156 lb 9.6 oz (71 kg)  04/23/22 154 lb (69.9 kg)  10/29/21 153 lb 9.6 oz (69.7 kg)    General:  Patient appears comfortable at rest. HEENT: Conjunctiva and lids normal. Neck: Supple, no elevated JVP or carotid bruits. Lungs: Decreased breath sounds, nonlabored breathing at rest. Cardiac: Regular rate and rhythm, no S3, 1/6 systolic murmur. Extremities: No pitting edema.  ECG:  An ECG dated 10/29/2021 was personally reviewed today and demonstrated:  Dual-chamber paced rhythm.  Recent Labwork: November 2023: Cholesterol 128, triglycerides 163, HDL 42, LDL 58 04/23/2022: BNP 350.9; BUN 38; Creatinine, Ser 1.32; Hemoglobin 13.1; Platelets 211; Potassium 4.6; Sodium 144; TSH 2.810   Other Studies Reviewed Today:  Echocardiogram 11/10/2021: Report did not cross over into Epic.  LVEF 60 to 65% with moderate diastolic dysfunction, mildly elevated estimated RVSP at 39.7 mmHg, moderate left atrial enlargement, mild mitral regurgitation, mild to moderate aortic stenosis  Assessment and Plan:  1.  Paroxysmal atrial fibrillation with CHA2DS2-VASc score of 5.  She had under 2% AF burden by last device interrogation, still does experience intermittent palpitations.  In general her rhythm control has been good on Tikosyn.  She remains on Eliquis for stroke prophylaxis along with bisoprolol for beta-blocker at this point.  I did review her recent lab work.  No changes made today.  2.  Medtronic ICD in place with follow-up by Dr. Lovena Le.  No device shocks.  Device function normal.  3.  Multivessel CAD status post CABG as well as DES to the proximal LAD.  Ischemic testing reassuring as of 2021.  No definite angina reported.  Would continue medical therapy and observation.  She remains on Lipitor.  4.  HFpEF, LVEF 60 to 65% with moderate diastolic dysfunction and mildly elevated estimated RVSP as of echocardiogram in July.  Some component of her shortness of breath, but not the sole etiology.  She is on Demadex with potassium supplement.  5.  Ambulatory hypoxia, recently assessed by Dr. Melvyn Novas with further  testing pending.  Supplemental oxygen recommended, she is still considering this.  Medication Adjustments/Labs and Tests Ordered: Current medicines are reviewed at length with the patient today.  Concerns regarding medicines are outlined above.   Tests Ordered: Orders Placed This Encounter  Procedures   EKG 12-Lead    Medication Changes: No orders of the defined types were placed in this encounter.   Disposition:  Follow up  6 months.  Signed, Satira Sark, MD, [facility] 04/29/2022 5:00 PM    [facility]. [address] [place], [place] [address] Phone: [phone]; Fax: [phone]

## 2022-05-06 ENCOUNTER — Encounter: Payer: Self-pay | Admitting: Internal Medicine

## 2022-05-06 ENCOUNTER — Other Ambulatory Visit (HOSPITAL_COMMUNITY): Payer: Self-pay | Admitting: Internal Medicine

## 2022-05-06 ENCOUNTER — Ambulatory Visit: Payer: Medicare PPO | Attending: Internal Medicine | Admitting: Internal Medicine

## 2022-05-06 VITALS — BP 126/60 | HR 73 | Ht 60.0 in | Wt 156.0 lb

## 2022-05-06 DIAGNOSIS — Z1231 Encounter for screening mammogram for malignant neoplasm of breast: Secondary | ICD-10-CM

## 2022-05-06 DIAGNOSIS — I442 Atrioventricular block, complete: Secondary | ICD-10-CM | POA: Diagnosis not present

## 2022-05-06 DIAGNOSIS — I5032 Chronic diastolic (congestive) heart failure: Secondary | ICD-10-CM | POA: Diagnosis not present

## 2022-05-06 LAB — CUP PACEART INCLINIC DEVICE CHECK
Battery Remaining Longevity: 74 mo
Battery Voltage: 2.98 V
Brady Statistic AP VP Percent: 78.08 %
Brady Statistic AP VS Percent: 0.02 %
Brady Statistic AS VP Percent: 21.88 %
Brady Statistic AS VS Percent: 0.02 %
Brady Statistic RA Percent Paced: 77 %
Brady Statistic RV Percent Paced: 99.81 %
Date Time Interrogation Session: 20240117165850
HighPow Impedance: 55 Ohm
HighPow Impedance: 77 Ohm
Implantable Lead Connection Status: 753985
Implantable Lead Connection Status: 753985
Implantable Lead Implant Date: 20030823
Implantable Lead Implant Date: 20030823
Implantable Lead Location: 753859
Implantable Lead Location: 753860
Implantable Lead Model: 157
Implantable Lead Model: 4086
Implantable Lead Serial Number: 108215
Implantable Lead Serial Number: 112190
Implantable Pulse Generator Implant Date: 20230419
Lead Channel Impedance Value: 342 Ohm
Lead Channel Impedance Value: 342 Ohm
Lead Channel Impedance Value: 399 Ohm
Lead Channel Pacing Threshold Amplitude: 0.75 V
Lead Channel Pacing Threshold Amplitude: 1.25 V
Lead Channel Pacing Threshold Amplitude: 1.375 V
Lead Channel Pacing Threshold Pulse Width: 0.4 ms
Lead Channel Pacing Threshold Pulse Width: 0.4 ms
Lead Channel Pacing Threshold Pulse Width: 0.4 ms
Lead Channel Sensing Intrinsic Amplitude: 1.25 mV
Lead Channel Sensing Intrinsic Amplitude: 1.25 mV
Lead Channel Sensing Intrinsic Amplitude: 11.75 mV
Lead Channel Sensing Intrinsic Amplitude: 11.75 mV
Lead Channel Setting Pacing Amplitude: 2 V
Lead Channel Setting Pacing Amplitude: 2.75 V
Lead Channel Setting Pacing Pulse Width: 0.4 ms
Lead Channel Setting Sensing Sensitivity: 0.3 mV
Zone Setting Status: 755011

## 2022-05-06 NOTE — Progress Notes (Signed)
HPI [name] [name] returns today for followup. She is a pleasant 89 yo woman with a h/o chronic systolic/diastolic heart failure, persistent atrial fib, CHB, s/p PPM, VT, s/p ICD and asthma. She has done better in the past several months. She does not feel palpitations but her symptoms are improved. No ICD shock. She has mild peripheral edema. She admits to sodium indiscretion. She has undergone ICD gen change out. In the interim, she notes chronic dyspnea, class 2.  She has seen Dr. Melvyn Novas. She desaturates with walking.  Allergies  Allergen Reactions   Codeine Nausea And Vomiting   Fentanyl Itching    Itching, redness to scalp and neck   Keflex [Cephalexin] Itching and Rash     Current Outpatient Medications  Medication Sig Dispense Refill   acetaminophen (TYLENOL) 500 MG tablet Take 500 mg by mouth every morning.     albuterol (PROVENTIL HFA;VENTOLIN HFA) 108 (90 BASE) MCG/ACT inhaler Inhale 2 puffs into the lungs every 6 (six) hours as needed for wheezing or shortness of breath. 1 Inhaler 2   amLODipine (NORVASC) 5 MG tablet TAKE (1) TABLET BY MOUTH ONCE DAILY. 90 tablet 3   apixaban (ELIQUIS) 2.5 MG TABS tablet TAKE (1) TABLET BY MOUTH TWICE DAILY. 60 tablet 5   atorvastatin (LIPITOR) 20 MG tablet TAKE ONE TABLET BY MOUTH ONCE DAILY. 90 tablet 3   bisoprolol (ZEBETA) 10 MG tablet Take 1 tablet (10 mg total) by mouth daily. 30 tablet 11   Calcium Carbonate-Vitamin D (CALCIUM 600 + D PO) Take 1 tablet by mouth 2 (two) times daily.     Cholecalciferol (VITAMIN D) 2000 UNITS CAPS Take 2,000 Units by mouth at bedtime.     Clobetasol Propionate Emulsion 0.05 % topical foam Apply 1 application. topically daily as needed (scalp and neck). Solution     dofetilide (TIKOSYN) 125 MCG capsule TAKE (1) CAPSULE BY MOUTH TWICE DAILY. 093 capsule 0   folic acid (FOLVITE) 1 MG tablet Take 1 mg by mouth 2 (two) times daily.     losartan (COZAAR) 100 MG tablet TAKE (1) TABLET BY MOUTH DAILY. 90 tablet 3    Polyethyl Glycol-Propyl Glycol (SYSTANE OP) Place 1 drop into both eyes daily as needed (Dry eyes).     potassium chloride SA (KLOR-CON M) 20 MEQ tablet TAKE (1) TABLET BY MOUTH 3 TIMES DAILY. 270 tablet 3   torsemide (DEMADEX) 20 MG tablet TAKE 3 TABLETS BY MOUTH DAILY. 270 tablet 3   triamcinolone cream (KENALOG) 0.1 % Apply 1 application. topically daily as needed (itching).     No current facility-administered medications for this visit.     Past Medical History:  Diagnosis Date   Anxiety    Chronic atrial fibrillation (HCC)    Chronic back pain    Chronic diastolic heart failure (HCC)    Chronic obstructive pulmonary disease, unspecified (HCC)    Complete heart block (HCC)    COPD (chronic obstructive pulmonary disease) (HCC)    Coronary atherosclerosis of native coronary artery    Multivessel status post CABG, DES PLA March 2006   DDD (degenerative disc disease), lumbar    Essential hypertension    Headache    ICD (implantable cardioverter-defibrillator) in place    Mixed hyperlipidemia    Osteoarthritis    Ventricular fibrillation ([place]) 2003   a. seen on PPM interrogation a/w syncope    ROS:   All systems reviewed and negative except as noted in the HPI.   Past  Surgical History:  Procedure Laterality Date   CARDIAC DEFIBRILLATOR PLACEMENT     MDT dual chamber ICD   CARDIOVERSION N/A 08/09/2014   Procedure: CARDIOVERSION;  Surgeon: Sanda Klein, MD;  Location: [facility];  Service: Cardiovascular;  Laterality: N/A;   CORONARY ARTERY BYPASS GRAFT     LIMA to LAD, SVG to diagonal, SVG to ramus and OM   ICD GENERATOR CHANGEOUT N/A 08/06/2021   Procedure: [place] [place];  Surgeon: Evans Lance, MD;  Location: [facility] LAB;  Service: Cardiovascular;  Laterality: N/A;   IMPLANTABLE CARDIOVERTER DEFIBRILLATOR GENERATOR CHANGE N/A 09/25/2013   Procedure: IMPLANTABLE CARDIOVERTER DEFIBRILLATOR GENERATOR CHANGE;  Surgeon: Evans Lance, MD;  Location:  [facility] CATH LAB;  Service: Cardiovascular;  Laterality: N/A;   LEAD REVISION N/A 09/29/2013   Procedure: LEAD REVISION;  Surgeon: Deboraha Sprang, MD;  Location: [facility] CATH LAB;  Service: Cardiovascular;  Laterality: N/A;   TONSILLECTOMY     YAG LASER APPLICATION Left 10/21/5327   Procedure: YAG LASER APPLICATION;  Surgeon: Elta Guadeloupe T. Gershon Crane, MD;  Location: [facility];  Service: Ophthalmology;  Laterality: Left;   YAG LASER APPLICATION Right 01/11/2682   Procedure: YAG LASER APPLICATION;  Surgeon: Elta Guadeloupe T. Gershon Crane, MD;  Location: [facility];  Service: Ophthalmology;  Laterality: Right;     Family History  Problem Relation Age of Onset   Heart attack Father    Heart attack Brother      Social History   Socioeconomic History   Marital status: Widowed    Spouse name: Not on file   Number of children: 2   Years of education: Not on file   Highest education level: Not on file  Occupational History   Occupation: Retired    Comment: Surveyor, quantity: RETIRED  Tobacco Use   Smoking status: Never   Smokeless tobacco: Never  Vaping Use   Vaping Use: Never used  Substance and Sexual Activity   Alcohol use: No    Alcohol/week: 0.0 standard drinks of alcohol   Drug use: No   Sexual activity: Never  Other Topics Concern   Not on file  Social History Narrative   Not on file   Social Determinants of Health   Financial Resource Strain: Not on file  Food Insecurity: Not on file  Transportation Needs: Not on file  Physical Activity: Not on file  Stress: Not on file  Social Connections: Not on file  Intimate Partner Violence: Not on file     BP 126/60   Pulse 73   Ht 5' (1.524 m)   Wt 156 lb (70.8 kg)   SpO2 97%   BMI 30.47 kg/m   Physical Exam:  Well appearing NAD HEENT: Unremarkable Neck:  No JVD, no thyromegally Lymphatics:  No adenopathy Back:  No CVA tenderness Lungs:  Clear with no wheezes HEART:  Regular rate rhythm, no murmurs, no rubs, no clicks Abd:  soft,  positive bowel sounds, no organomegally, no rebound, no guarding Ext:  2 plus pulses, no edema, no cyanosis, no clubbing Skin:  No rashes no nodules Neuro:  CN II through XII intact, motor grossly intact  DEVICE  Normal device function.  See PaceArt for details.   Assess/Plan: VT - she has only had NSVT since her last visit. 2. Persistent atrial fib - her burden is much improved. She will continue dofetilide 3. CHB - she is asymptomatic, s/p DDD ICD 4. HFPEF - she has class 2 symptoms. We will follow  5. ICD - she has undergone ICD gen change out. 6. Dyspnea - I encouraged her to start oxygen. She has no extra fluid on optivol.    Carleene Overlie Maynor Mwangi,MD

## 2022-05-06 NOTE — Patient Instructions (Signed)
Medication Instructions:  Your physician recommends that you continue on your current medications as directed. Please refer to the Current Medication list given to you today.  *If you need a refill on your cardiac medications before your next appointment, please call your pharmacy*   Lab Work: NONE   If you have labs (blood work) drawn today and your tests are completely normal, you will receive your results only by: MyChart Message (if you have MyChart) OR A paper copy in the mail If you have any lab test that is abnormal or we need to change your treatment, we will call you to review the results.   Testing/Procedures: NONE    Follow-Up: At [facility], you and your health needs are our priority.  As part of our continuing mission to provide you with exceptional heart care, we have created designated Provider Care Teams.  These Care Teams include your primary Cardiologist (physician) and Advanced Practice Providers (APPs -  Physician Assistants and Nurse Practitioners) who all work together to provide you with the care you need, when you need it.  We recommend signing up for the patient portal called "MyChart".  Sign up information is provided on this After Visit Summary.  MyChart is used to connect with patients for Virtual Visits (Telemedicine).  Patients are able to view lab/test results, encounter notes, upcoming appointments, etc.  Non-urgent messages can be sent to your provider as well.   To learn more about what you can do with MyChart, go to [email or web address].    Your next appointment:   1 year(s)  Provider:   Gregg Taylor, MD    Other Instructions Thank you for choosing [facility]!    

## 2022-05-07 ENCOUNTER — Telehealth: Payer: Self-pay | Admitting: *Deleted

## 2022-05-07 DIAGNOSIS — R0609 Other forms of dyspnea: Secondary | ICD-10-CM

## 2022-05-07 DIAGNOSIS — R0902 Hypoxemia: Secondary | ICD-10-CM

## 2022-05-07 NOTE — Telephone Encounter (Signed)
Called and spoke with patient.  She was in office this month 2 weeks ago and at time she qualified for O2 on walk test but declined it. She called and states she would like O2 order since she is very SOB and cardiology is also recommending O2. Patient wants to have order sent to [facility]. Made her aware [facility] does not take [employer] but patient would like order still sent to them and does not want it sent to another DME until she speaks with [employer]. Made PCCs aware of order being placed. Nothing further needed at this time

## 2022-05-08 ENCOUNTER — Ambulatory Visit: Payer: Medicare PPO | Attending: Internal Medicine

## 2022-05-08 DIAGNOSIS — I442 Atrioventricular block, complete: Secondary | ICD-10-CM | POA: Diagnosis not present

## 2022-05-08 DIAGNOSIS — R0609 Other forms of dyspnea: Secondary | ICD-10-CM | POA: Diagnosis not present

## 2022-05-08 DIAGNOSIS — R0602 Shortness of breath: Secondary | ICD-10-CM | POA: Diagnosis not present

## 2022-05-11 DIAGNOSIS — H04123 Dry eye syndrome of bilateral lacrimal glands: Secondary | ICD-10-CM | POA: Diagnosis not present

## 2022-05-12 LAB — CUP PACEART REMOTE DEVICE CHECK
Battery Remaining Longevity: 73 mo
Battery Voltage: 2.98 V
Brady Statistic AP VP Percent: 90.37 %
Brady Statistic AP VS Percent: 0.01 %
Brady Statistic AS VP Percent: 9.62 %
Brady Statistic AS VS Percent: 0.01 %
Brady Statistic RA Percent Paced: 90.13 %
Brady Statistic RV Percent Paced: 99.86 %
Date Time Interrogation Session: 20240119042307
HighPow Impedance: 56 Ohm
HighPow Impedance: 73 Ohm
Implantable Lead Connection Status: 753985
Implantable Lead Connection Status: 753985
Implantable Lead Implant Date: 20030823
Implantable Lead Implant Date: 20030823
Implantable Lead Location: 753859
Implantable Lead Location: 753860
Implantable Lead Model: 157
Implantable Lead Model: 4086
Implantable Lead Serial Number: 108215
Implantable Lead Serial Number: 112190
Implantable Pulse Generator Implant Date: 20230419
Lead Channel Impedance Value: 323 Ohm
Lead Channel Impedance Value: 323 Ohm
Lead Channel Impedance Value: 399 Ohm
Lead Channel Pacing Threshold Amplitude: 1.375 V
Lead Channel Pacing Threshold Pulse Width: 0.4 ms
Lead Channel Sensing Intrinsic Amplitude: 11.75 mV
Lead Channel Sensing Intrinsic Amplitude: 11.75 mV
Lead Channel Sensing Intrinsic Amplitude: 2.25 mV
Lead Channel Sensing Intrinsic Amplitude: 2.25 mV
Lead Channel Setting Pacing Amplitude: 2 V
Lead Channel Setting Pacing Amplitude: 2.75 V
Lead Channel Setting Pacing Pulse Width: 0.4 ms
Lead Channel Setting Sensing Sensitivity: 0.3 mV
Zone Setting Status: 755011

## 2022-05-14 ENCOUNTER — Ambulatory Visit (HOSPITAL_COMMUNITY)
Admission: RE | Admit: 2022-05-14 | Discharge: 2022-05-14 | Disposition: A | Discharge status: Home / Self Care | Payer: Medicare PPO | Source: Ambulatory Visit | Attending: Internal Medicine | Admitting: Internal Medicine

## 2022-05-14 DIAGNOSIS — Z1231 Encounter for screening mammogram for malignant neoplasm of breast: Secondary | ICD-10-CM | POA: Diagnosis not present

## 2022-05-18 ENCOUNTER — Ambulatory Visit: Payer: Medicare PPO | Admitting: Orthopedic Surgery

## 2022-05-22 ENCOUNTER — Ambulatory Visit: Payer: Medicare PPO | Admitting: Adult Health

## 2022-05-22 ENCOUNTER — Encounter: Payer: Self-pay | Admitting: Adult Health

## 2022-05-22 ENCOUNTER — Ambulatory Visit (INDEPENDENT_AMBULATORY_CARE_PROVIDER_SITE_OTHER): Payer: Medicare PPO | Admitting: Internal Medicine

## 2022-05-22 ENCOUNTER — Telehealth: Payer: Self-pay | Admitting: Adult Health

## 2022-05-22 VITALS — BP 102/56 | HR 69 | Temp 97.8°F | Ht 61.0 in | Wt 160.0 lb

## 2022-05-22 DIAGNOSIS — J4489 Other specified chronic obstructive pulmonary disease: Secondary | ICD-10-CM | POA: Insufficient documentation

## 2022-05-22 DIAGNOSIS — I5032 Chronic diastolic (congestive) heart failure: Secondary | ICD-10-CM

## 2022-05-22 DIAGNOSIS — I10 Essential (primary) hypertension: Secondary | ICD-10-CM

## 2022-05-22 DIAGNOSIS — R0609 Other forms of dyspnea: Secondary | ICD-10-CM

## 2022-05-22 DIAGNOSIS — J9611 Chronic respiratory failure with hypoxia: Secondary | ICD-10-CM | POA: Insufficient documentation

## 2022-05-22 LAB — PULMONARY FUNCTION TEST
DL/VA % pred: 72 %
DL/VA: 2.99 ml/min/mmHg/L
DLCO cor % pred: 46 %
DLCO cor: 7.63 ml/min/mmHg
DLCO unc % pred: 50 %
DLCO unc: 8.36 ml/min/mmHg
FEF 25-75 Post: 0.6 L/sec
FEF 25-75 Pre: 0.42 L/sec
FEF2575-%Change-Post: 42 %
FEF2575-%Pred-Post: 73 %
FEF2575-%Pred-Pre: 51 %
FEV1-%Change-Post: 11 %
FEV1-%Pred-Post: 57 %
FEV1-%Pred-Pre: 51 %
FEV1-Post: 0.79 L
FEV1-Pre: 0.71 L
FEV1FVC-%Change-Post: -2 %
FEV1FVC-%Pred-Pre: 86 %
FEV6-%Change-Post: 13 %
FEV6-%Pred-Post: 74 %
FEV6-%Pred-Pre: 65 %
FEV6-Post: 1.3 L
FEV6-Pre: 1.14 L
FEV6FVC-%Pred-Post: 107 %
FEV6FVC-%Pred-Pre: 107 %
FVC-%Change-Post: 13 %
FVC-%Pred-Post: 68 %
FVC-%Pred-Pre: 60 %
FVC-Post: 1.3 L
FVC-Pre: 1.14 L
Post FEV1/FVC ratio: 61 %
Post FEV6/FVC ratio: 100 %
Pre FEV1/FVC ratio: 62 %
Pre FEV6/FVC Ratio: 100 %
RV % pred: 123 %
RV: 2.96 L
TLC % pred: 90 %
TLC: 4.18 L

## 2022-05-22 LAB — POCT EXHALED NITRIC OXIDE: FeNO level (ppb): 17

## 2022-05-22 MED ORDER — FLUTICASONE FUROATE-VILANTEROL 100-25 MCG/ACT IN AEPB
1.0000 | INHALATION_SPRAY | Freq: Every day | RESPIRATORY_TRACT | 5 refills | Status: DC
Start: 2022-05-22 — End: 2022-06-17

## 2022-05-22 NOTE — Assessment & Plan Note (Signed)
Chronic diastolic heart failure-does not appear in overt volume overload on exam.  Continue current regimen and maintenance with cardiology

## 2022-05-22 NOTE — Assessment & Plan Note (Signed)
Patient's blood pressure has been lower on bisoprolol.  Will decrease bisoprolol 10 mg to half tablet daily.  Continue follow-up with cardiology and primary care for ongoing blood pressure management.

## 2022-05-22 NOTE — Assessment & Plan Note (Signed)
Suspect is multifactorial with underlying chronic A-fib, chronic diastolic heart failure, aortic valve stenosis, physical deconditioning and chronic obstructive asthma.  Will add in Breo for asthma maintenance inhaler.  Continue on cardiac regimen.  Patient does have some pulmonary hypertension most likely secondary to diastolic heart failure possible untreated sleep apnea. For now we will continue on oxygen to keep O2 saturations greater than 88 to 90%.  Diuresis as able.  Plan  Patient Instructions  Decrease Bisoprolol '10mg'$  1/2 tab daily  Follow up cardiology /primary care for ongoing blood pressure management  Begin BREO 1 puff , rinse after use .  Albuterol inhaler As needed   Continue on Oxygen 2l/m.  Activity as tolerated.  Saline nasal spray Twice daily Sips of water to soothe throat , avoid cough /throat clearing , No mints.  Delsym 2 tsp Twice daily  As needed  cough Follow up with Dr. Melvyn Novas  in 6 weeks and As needed   Please contact office for sooner follow up if symptoms do not improve or worsen or seek emergency care

## 2022-05-22 NOTE — Progress Notes (Signed)
Full PFT performed today. °

## 2022-05-22 NOTE — Progress Notes (Signed)
Has had maderna covid vaccines (6).  Patient seen in the office today and instructed on use of Breo.  Patient expressed understanding and demonstrated technique.

## 2022-05-22 NOTE — Addendum Note (Signed)
Addended by: Melvenia Needles on: 05/22/2022 08:54 PM   Modules accepted: Orders

## 2022-05-22 NOTE — Telephone Encounter (Signed)
Breo rx from ov was not at pharmacy . Chart check and rx did not go through .  Rx sent , Called son x 2  LMOM. Pharmacy was closed , sent e prescribe.

## 2022-05-22 NOTE — Assessment & Plan Note (Signed)
Continue on oxygen to maintain O2 saturations greater than 88 to 90%.  Recommend a portable system they are currently working with [employer] to determine the best system for them.

## 2022-05-22 NOTE — Progress Notes (Signed)
$'@Patient'J$  ID: Hillis Range, female    DOB: [birth year], 89 y.o.   MRN: [id number]  Chief Complaint  Patient presents with   Follow-up    Referring provider: Asencion Noble, MD  HPI: 89 year old female never smoker seen for pulmonary consult April 23, 2022 for shortness of breath PPM , CAD   TEST/EVENTS :  04/23/2022 desat to 84% p 150 ft and declined repeating study on amb 02    Echo 08/22/98 mod diastolic dysfunction   - PFT's  11/07/14  FEV1 1.14 (69 % ) ratio 0.68  p 8 % improvement from saba p nothing prior to study with DLCO  10.72 (53%)   and FV curve mildly concave    Echocardiogram 11/10/2021:   LVEF 60 to 65% with moderate diastolic dysfunction, mildly elevated estimated RVSP at 39.7 mmHg, moderate left atrial enlargement, mild mitral regurgitation, mild to moderate aortic stenosis  05/22/2022 Follow up : Dyspnea  Patient returns for a 1 month follow-up.  Patient was seen last visit for pulmonary consult for shortness of breath  that has gotten worse for last few years , definitely more noticeable in the last 6 months.  With decreased activity tolerance.Last visit walk test showed desaturations on room air.  Patient declined oxygen start initially and then called back to start on oxygen.  Patient does feel like the oxygen has helped some.  She is working with [employer] on a more portable system. Chest x-ray April 23, 2022 showed bronchitic changes with stable right lung base scarring PFTs today show moderate airflow obstruction and reversibility with FEV1 at 57%, ratio 61, FVC 68%, positive bronchodilator response, DLCO 50%.  Patient had previously been told in 2016 that she had COPD but had never smoked.  PFTs in July 2016 that showed FEV1 at 69% and a ratio of 68.  Patient says she was given albuterol inhaler she used it on occasion. She has no known occupational exposure.  She is a retired Therapist, sports.  She has no pets, hot tub, basement, travel.  No known exposure to amiodarone,  Macrodantin or methotrexate.  No known secondhand smoke exposure.  No previous exposure to wood stove.  And was not premature.  She does have some intermittent wheezing.  No significant cough but does clear her throat often.  Feels that she has nasal dryness and a tickle in her throat.  Last visit patient was changed from metoprolol to bisoprolol.  Patient does feel that her blood pressure has been a little bit lower on the bisoprolol.  Today in the office blood pressure is around 102/60.  She has no significant dizziness but is worried about it being too low.  She does have chronic A-fib is followed by cardiology.  Previous echo showed mild to moderate aortic valve stenosis.  And mildly elevated RVSP at 39.7 mmHg.  She does have chronic lower extremity edema.  She is on chronic anticoagulation therapy.  She has been discussed to have a sleep study in the past but has not wanted to pursue this.  Does have some snoring and restless sleep.  She is accompanied by her son today for the visit Exhaled nitric oxide testing today is 17 ppb.    Allergies  Allergen Reactions   Codeine Nausea And Vomiting   Fentanyl Itching    Itching, redness to scalp and neck   Keflex [Cephalexin] Itching and Rash    Immunization History  Administered Date(s) Administered   Fluad Quad(high Dose 65+) 04/19/2022   Influenza-Unspecified  01/18/2014, 01/21/2016, 02/02/2017, 01/10/2018, 01/18/2018   Pneumococcal Polysaccharide-23 06/08/2018   Tdap 03/26/2018    Past Medical History:  Diagnosis Date   Anxiety    Chronic atrial fibrillation (HCC)    Chronic back pain    Chronic diastolic heart failure (HCC)    Chronic obstructive pulmonary disease, unspecified (HCC)    Complete heart block (HCC)    COPD (chronic obstructive pulmonary disease) ([place])    Coronary atherosclerosis of native coronary artery    Multivessel status post CABG, DES PLA March 2006   DDD (degenerative disc disease), lumbar    Essential  hypertension    Headache    ICD (implantable cardioverter-defibrillator) in place    Mixed hyperlipidemia    Osteoarthritis    Ventricular fibrillation ([place]) 2003   a. seen on PPM interrogation a/w syncope    Tobacco History: Social History   Tobacco Use  Smoking Status Never  Smokeless Tobacco Never   Counseling given: Not Answered   Outpatient Medications Prior to Visit  Medication Sig Dispense Refill   acetaminophen (TYLENOL) 500 MG tablet Take 500 mg by mouth every morning.     albuterol (PROVENTIL HFA;VENTOLIN HFA) 108 (90 BASE) MCG/ACT inhaler Inhale 2 puffs into the lungs every 6 (six) hours as needed for wheezing or shortness of breath. 1 Inhaler 2   amLODipine (NORVASC) 5 MG tablet TAKE (1) TABLET BY MOUTH ONCE DAILY. 90 tablet 3   apixaban (ELIQUIS) 2.5 MG TABS tablet TAKE (1) TABLET BY MOUTH TWICE DAILY. 60 tablet 5   atorvastatin (LIPITOR) 20 MG tablet TAKE ONE TABLET BY MOUTH ONCE DAILY. 90 tablet 3   bisoprolol (ZEBETA) 10 MG tablet Take 1 tablet (10 mg total) by mouth daily. 30 tablet 11   Calcium Carbonate-Vitamin D (CALCIUM 600 + D PO) Take 1 tablet by mouth 2 (two) times daily.     Cholecalciferol (VITAMIN D) 2000 UNITS CAPS Take 2,000 Units by mouth at bedtime.     Clobetasol Propionate Emulsion 0.05 % topical foam Apply 1 application. topically daily as needed (scalp and neck). Solution     dofetilide (TIKOSYN) 125 MCG capsule TAKE (1) CAPSULE BY MOUTH TWICE DAILY. 678 capsule 0   folic acid (FOLVITE) 1 MG tablet Take 1 mg by mouth 2 (two) times daily.     losartan (COZAAR) 100 MG tablet TAKE (1) TABLET BY MOUTH DAILY. 90 tablet 3   Polyethyl Glycol-Propyl Glycol (SYSTANE OP) Place 1 drop into both eyes daily as needed (Dry eyes).     potassium chloride SA (KLOR-CON M) 20 MEQ tablet TAKE (1) TABLET BY MOUTH 3 TIMES DAILY. 270 tablet 3   torsemide (DEMADEX) 20 MG tablet TAKE 3 TABLETS BY MOUTH DAILY. 270 tablet 3   triamcinolone cream (KENALOG) 0.1 % Apply 1  application. topically daily as needed (itching).     No facility-administered medications prior to visit.     Review of Systems:   Constitutional:   No  weight loss, night sweats,  Fevers, chills, + fatigue, or  lassitude.  HEENT:   No headaches,  Difficulty swallowing,  Tooth/dental problems, or  Sore throat,                No sneezing, itching, ear ache, nasal congestion, post nasal drip,   CV:  No chest pain,  Orthopnea, PND, anasarca, dizziness, palpitations, syncope.   GI  No heartburn, indigestion, abdominal pain, nausea, vomiting, diarrhea, change in bowel habits, loss of appetite, bloody stools.   Resp:  No chest wall deformity  Skin: no rash or lesions.  GU: no dysuria, change in color of urine, no urgency or frequency.  No flank pain, no hematuria   MS:  No joint pain or swelling.  No decreased range of motion.  No back pain.    Physical Exam  BP (!) 102/56 (BP Location: Right Arm, Patient Position: Sitting, Cuff Size: Normal)   Pulse 69   Temp 97.8 F (36.6 C) (Oral)   Ht '5\' 1"'$  (1.549 m)   Wt 160 lb (72.6 kg)   SpO2 97%   BMI 30.23 kg/m   GEN: A/Ox3; pleasant , NAD, in wheelchair, on oxygen, elderly   HEENT:  [place]/AT,  EACs-clear, TMs-wnl, NOSE-clear, THROAT-clear, no lesions, no postnasal drip or exudate noted.   NECK:  Supple w/ fair ROM; no JVD; normal carotid impulses w/o bruits; no thyromegaly or nodules palpated; no lymphadenopathy.    RESP  Clear  P & A; w/o, wheezes/ rales/ or rhonchi. no accessory muscle use, no dullness to percussion  CARD:  irreg/irreg  no m/r/g, 1-2+ edema peripheral edema, pulses intact, no cyanosis or clubbing.  GI:   Soft & nt; nml bowel sounds; no organomegaly or masses detected.   Musco: Warm bil, no deformities or joint swelling noted.   Neuro: alert, no focal deficits noted.    Skin: Warm, no lesions or rashes    Lab Results:  CBC    Component Value Date/Time   WBC 9.2 04/23/2022 1612   WBC 7.7 07/24/2021  1226   RBC 4.19 04/23/2022 1612   RBC 3.91 07/24/2021 1226   HGB 13.1 04/23/2022 1612   HCT 40.0 04/23/2022 1612   PLT 211 04/23/2022 1612   MCV 96 04/23/2022 1612   MCH 31.3 04/23/2022 1612   MCH 32.2 07/24/2021 1226   MCHC 32.8 04/23/2022 1612   MCHC 32.9 07/24/2021 1226   RDW 12.0 04/23/2022 1612   LYMPHSABS 1.8 04/23/2022 1612   MONOABS 2.2 (H) 05/04/2017 0021   EOSABS 0.2 04/23/2022 1612   BASOSABS 0.1 04/23/2022 1612    BMET    Component Value Date/Time   NA 144 04/23/2022 1612   K 4.6 04/23/2022 1612   CL 99 04/23/2022 1612   CO2 27 04/23/2022 1612   GLUCOSE 93 04/23/2022 1612   GLUCOSE 112 (H) 07/24/2021 1226   BUN 38 (H) 04/23/2022 1612   CREATININE 1.32 (H) 04/23/2022 1612   CREATININE 0.99 (H) 02/06/2015 1052   CALCIUM 10.2 04/23/2022 1612   GFRNONAA 26 (L) 07/24/2021 1226   GFRAA 37 (L) 11/08/2019 1139    BNP    Component Value Date/Time   BNP 350.9 (H) 04/23/2022 1612   BNP 254.0 (H) 05/04/2017 0027    ProBNP    Component Value Date/Time   PROBNP 1,118.0 (H) 07/22/2013 0505          Latest Ref Rng & Units 05/22/2022    2:58 PM 11/07/2014    1:09 PM 07/04/2014    9:18 AM  PFT Results  FVC-Pre L 1.14  P 1.64  1.25   FVC-Predicted Pre % 60  P 73  55   FVC-Post L 1.30  P 1.68  1.48   FVC-Predicted Post % 68  P 75  66   Pre FEV1/FVC % % 62  P 65  65   Post FEV1/FCV % % 61  P 68  69   FEV1-Pre L 0.71  P 1.06  0.81   FEV1-Predicted Pre % 51  P 64  49   FEV1-Post L 0.79  P 1.14  1.03   DLCO uncorrected ml/min/mmHg 8.36  P 10.72  8.96   DLCO UNC% % 50  P 53  44   DLCO corrected ml/min/mmHg 7.63  P  8.96   DLCO COR %Predicted % 46  P  44   DLVA Predicted % 72  P 78  91   TLC L 4.18  P 4.22  3.89   TLC % Predicted % 90  P 91  84   RV % Predicted % 123  P 113  119     P Preliminary result    No results found for: "NITRICOXIDE"      Assessment & Plan:   Chronic obstructive asthma PFTs show progressive lung function decline with  chronic obstruction-mild reversibility.  Suspect this is due to underlying chronic obstructive asthma-untreated.  Recommend a trial of Breo daily.  Albuterol as needed. Chest x-ray showed bronchitic changes.  Could consider high-resolution CT chest to rule out underlying interstitial process.  Will hold on this for now.  Plan  Patient Instructions  Decrease Bisoprolol '10mg'$  1/2 tab daily  Follow up cardiology /primary care for ongoing blood pressure management  Begin BREO 1 puff , rinse after use .  Albuterol inhaler As needed   Continue on Oxygen 2l/m.  Activity as tolerated.  Saline nasal spray Twice daily Sips of water to soothe throat , avoid cough /throat clearing , No mints.  Delsym 2 tsp Twice daily  As needed  cough Follow up with Dr. Melvyn Novas  in 6 weeks and As needed   Please contact office for sooner follow up if symptoms do not improve or worsen or seek emergency care      Chronic diastolic heart failure ([place]) Chronic diastolic heart failure-does not appear in overt volume overload on exam.  Continue current regimen and maintenance with cardiology  Essential (primary) hypertension Patient's blood pressure has been lower on bisoprolol.  Will decrease bisoprolol 10 mg to half tablet daily.  Continue follow-up with cardiology and primary care for ongoing blood pressure management.  Chronic respiratory failure with hypoxia (HCC) Continue on oxygen to maintain O2 saturations greater than 88 to 90%.  Recommend a portable system they are currently working with [employer] to determine the best system for them.  DOE (dyspnea on exertion) Suspect is multifactorial with underlying chronic A-fib, chronic diastolic heart failure, aortic valve stenosis, physical deconditioning and chronic obstructive asthma.  Will add in Breo for asthma maintenance inhaler.  Continue on cardiac regimen.  Patient does have some pulmonary hypertension most likely secondary to diastolic heart failure  possible untreated sleep apnea. For now we will continue on oxygen to keep O2 saturations greater than 88 to 90%.  Diuresis as able.  Plan  Patient Instructions  Decrease Bisoprolol '10mg'$  1/2 tab daily  Follow up cardiology /primary care for ongoing blood pressure management  Begin BREO 1 puff , rinse after use .  Albuterol inhaler As needed   Continue on Oxygen 2l/m.  Activity as tolerated.  Saline nasal spray Twice daily Sips of water to soothe throat , avoid cough /throat clearing , No mints.  Delsym 2 tsp Twice daily  As needed  cough Follow up with Dr. Melvyn Novas  in 6 weeks and As needed   Please contact office for sooner follow up if symptoms do not improve or worsen or seek emergency care       I spent   42  minutes dedicated to the care of this patient on the date of this encounter to include pre-visit review of records, face-to-face time with the patient discussing conditions above, post visit ordering of testing, clinical documentation with the electronic health record, making appropriate referrals as documented, and communicating necessary findings to members of the patients care team.   Rexene Edison, NP 05/22/2022

## 2022-05-22 NOTE — Patient Instructions (Signed)
Full PFT performed today. °

## 2022-05-22 NOTE — Addendum Note (Signed)
Addended by: Melvenia Needles on: 05/22/2022 08:52 PM   Modules accepted: Orders

## 2022-05-22 NOTE — Assessment & Plan Note (Signed)
PFTs show progressive lung function decline with chronic obstruction-mild reversibility.  Suspect this is due to underlying chronic obstructive asthma-untreated.  Recommend a trial of Breo daily.  Albuterol as needed. Chest x-ray showed bronchitic changes.  Could consider high-resolution CT chest to rule out underlying interstitial process.  Will hold on this for now.  Plan  Patient Instructions  Decrease Bisoprolol '10mg'$  1/2 tab daily  Follow up cardiology /primary care for ongoing blood pressure management  Begin BREO 1 puff , rinse after use .  Albuterol inhaler As needed   Continue on Oxygen 2l/m.  Activity as tolerated.  Saline nasal spray Twice daily Sips of water to soothe throat , avoid cough /throat clearing , No mints.  Delsym 2 tsp Twice daily  As needed  cough Follow up with [name] [name]  in 6 weeks and As needed   Please contact office for sooner follow up if symptoms do not improve or worsen or seek emergency care

## 2022-05-22 NOTE — Patient Instructions (Signed)
Decrease Bisoprolol '10mg'$  1/2 tab daily  Follow up cardiology /primary care for ongoing blood pressure management  Begin BREO 1 puff , rinse after use .  Albuterol inhaler As needed   Continue on Oxygen 2l/m.  Activity as tolerated.  Saline nasal spray Twice daily Sips of water to soothe throat , avoid cough /throat clearing , No mints.  Delsym 2 tsp Twice daily  As needed  cough Follow up with Dr. Melvyn Novas  in 6 weeks and As needed   Please contact office for sooner follow up if symptoms do not improve or worsen or seek emergency care

## 2022-05-25 NOTE — Progress Notes (Signed)
Remote ICD transmission.   

## 2022-05-26 ENCOUNTER — Ambulatory Visit: Payer: Medicare PPO | Admitting: Internal Medicine

## 2022-06-01 ENCOUNTER — Telehealth: Payer: Self-pay

## 2022-06-01 NOTE — Telephone Encounter (Signed)
Patients son called and states patient has been struggling with her breathing lately.  He states with exertion her O2 sats are dropping to high 80s with 2LO2.  Advised him he can titrate her O2 to a higher flow to maintain patients sats above 90% with exertion.  He voiced understanding and I advised him I will send an FYI message to Dr. Melvyn Novas.  Nothing further needed    [facility] Cell number [phone].

## 2022-06-02 DIAGNOSIS — N1832 Chronic kidney disease, stage 3b: Secondary | ICD-10-CM | POA: Diagnosis not present

## 2022-06-02 DIAGNOSIS — I5030 Unspecified diastolic (congestive) heart failure: Secondary | ICD-10-CM | POA: Diagnosis not present

## 2022-06-02 DIAGNOSIS — Z79899 Other long term (current) drug therapy: Secondary | ICD-10-CM | POA: Diagnosis not present

## 2022-06-02 DIAGNOSIS — I10 Essential (primary) hypertension: Secondary | ICD-10-CM | POA: Diagnosis not present

## 2022-06-08 ENCOUNTER — Ambulatory Visit: Payer: Medicare PPO | Admitting: Orthopedic Surgery

## 2022-06-08 DIAGNOSIS — R0602 Shortness of breath: Secondary | ICD-10-CM | POA: Diagnosis not present

## 2022-06-08 DIAGNOSIS — R0609 Other forms of dyspnea: Secondary | ICD-10-CM | POA: Diagnosis not present

## 2022-06-09 DIAGNOSIS — J45909 Unspecified asthma, uncomplicated: Secondary | ICD-10-CM | POA: Diagnosis not present

## 2022-06-09 DIAGNOSIS — J9611 Chronic respiratory failure with hypoxia: Secondary | ICD-10-CM | POA: Diagnosis not present

## 2022-06-09 DIAGNOSIS — I5030 Unspecified diastolic (congestive) heart failure: Secondary | ICD-10-CM | POA: Diagnosis not present

## 2022-06-09 DIAGNOSIS — N1832 Chronic kidney disease, stage 3b: Secondary | ICD-10-CM | POA: Diagnosis not present

## 2022-06-14 ENCOUNTER — Emergency Department (HOSPITAL_COMMUNITY): Payer: Medicare PPO

## 2022-06-14 ENCOUNTER — Encounter (HOSPITAL_COMMUNITY): Payer: Self-pay | Admitting: Emergency Medicine

## 2022-06-14 ENCOUNTER — Other Ambulatory Visit: Payer: Self-pay

## 2022-06-14 ENCOUNTER — Emergency Department (HOSPITAL_COMMUNITY)
Admission: EM | Admit: 2022-06-14 | Discharge: 2022-06-14 | Disposition: A | Discharge status: Home / Self Care | Payer: Medicare PPO | Attending: Emergency Medicine | Admitting: Emergency Medicine

## 2022-06-14 DIAGNOSIS — J45909 Unspecified asthma, uncomplicated: Secondary | ICD-10-CM | POA: Insufficient documentation

## 2022-06-14 DIAGNOSIS — R0600 Dyspnea, unspecified: Secondary | ICD-10-CM

## 2022-06-14 DIAGNOSIS — J40 Bronchitis, not specified as acute or chronic: Secondary | ICD-10-CM | POA: Insufficient documentation

## 2022-06-14 DIAGNOSIS — Z7901 Long term (current) use of anticoagulants: Secondary | ICD-10-CM | POA: Insufficient documentation

## 2022-06-14 DIAGNOSIS — I10 Essential (primary) hypertension: Secondary | ICD-10-CM | POA: Diagnosis not present

## 2022-06-14 DIAGNOSIS — Z1152 Encounter for screening for COVID-19: Secondary | ICD-10-CM | POA: Diagnosis not present

## 2022-06-14 DIAGNOSIS — Z79899 Other long term (current) drug therapy: Secondary | ICD-10-CM | POA: Diagnosis not present

## 2022-06-14 DIAGNOSIS — R600 Localized edema: Secondary | ICD-10-CM | POA: Insufficient documentation

## 2022-06-14 DIAGNOSIS — R609 Edema, unspecified: Secondary | ICD-10-CM

## 2022-06-14 DIAGNOSIS — R0602 Shortness of breath: Secondary | ICD-10-CM | POA: Diagnosis not present

## 2022-06-14 DIAGNOSIS — J90 Pleural effusion, not elsewhere classified: Secondary | ICD-10-CM | POA: Diagnosis not present

## 2022-06-14 HISTORY — DX: Unspecified asthma, uncomplicated: J45.909

## 2022-06-14 LAB — CBC WITH DIFFERENTIAL/PLATELET
Abs Immature Granulocytes: 0.04 10*3/uL (ref 0.00–0.07)
Basophils Absolute: 0 10*3/uL (ref 0.0–0.1)
Basophils Relative: 1 %
Eosinophils Absolute: 0.1 10*3/uL (ref 0.0–0.5)
Eosinophils Relative: 1 %
HCT: 36.9 % (ref 36.0–46.0)
Hemoglobin: 11.7 g/dL — ABNORMAL LOW (ref 12.0–15.0)
Immature Granulocytes: 1 %
Lymphocytes Relative: 15 %
Lymphs Abs: 1.3 10*3/uL (ref 0.7–4.0)
MCH: 31.7 pg (ref 26.0–34.0)
MCHC: 31.7 g/dL (ref 30.0–36.0)
MCV: 100 fL (ref 80.0–100.0)
Monocytes Absolute: 1.1 10*3/uL — ABNORMAL HIGH (ref 0.1–1.0)
Monocytes Relative: 12 %
Neutro Abs: 6.2 10*3/uL (ref 1.7–7.7)
Neutrophils Relative %: 70 %
Platelets: 204 10*3/uL (ref 150–400)
RBC: 3.69 MIL/uL — ABNORMAL LOW (ref 3.87–5.11)
RDW: 15.1 % (ref 11.5–15.5)
WBC: 8.8 10*3/uL (ref 4.0–10.5)
nRBC: 0 % (ref 0.0–0.2)

## 2022-06-14 LAB — BLOOD GAS, VENOUS
Acid-Base Excess: 7.8 mmol/L — ABNORMAL HIGH (ref 0.0–2.0)
Bicarbonate: 34.2 mmol/L — ABNORMAL HIGH (ref 20.0–28.0)
Drawn by: 64452
O2 Saturation: 53 %
Patient temperature: 36.8
pCO2, Ven: 54 mmHg (ref 44–60)
pH, Ven: 7.41 (ref 7.25–7.43)
pO2, Ven: 32 mmHg (ref 32–45)

## 2022-06-14 LAB — COMPREHENSIVE METABOLIC PANEL
ALT: 17 U/L (ref 0–44)
AST: 24 U/L (ref 15–41)
Albumin: 4.2 g/dL (ref 3.5–5.0)
Alkaline Phosphatase: 54 U/L (ref 38–126)
Anion gap: 11 (ref 5–15)
BUN: 51 mg/dL — ABNORMAL HIGH (ref 8–23)
CO2: 29 mmol/L (ref 22–32)
Calcium: 10 mg/dL (ref 8.9–10.3)
Chloride: 103 mmol/L (ref 98–111)
Creatinine, Ser: 1.38 mg/dL — ABNORMAL HIGH (ref 0.44–1.00)
GFR, Estimated: 37 mL/min — ABNORMAL LOW (ref >60)
Glucose, Bld: 115 mg/dL — ABNORMAL HIGH (ref 70–99)
Potassium: 4.8 mmol/L (ref 3.5–5.1)
Sodium: 143 mmol/L (ref 135–145)
Total Bilirubin: 1.3 mg/dL — ABNORMAL HIGH (ref 0.3–1.2)
Total Protein: 7.1 g/dL (ref 6.5–8.1)

## 2022-06-14 LAB — RESP PANEL BY RT-PCR (RSV, FLU A&B, COVID)  RVPGX2
Influenza A by PCR: NEGATIVE
Influenza B by PCR: NEGATIVE
Resp Syncytial Virus by PCR: NEGATIVE
SARS Coronavirus 2 by RT PCR: NEGATIVE

## 2022-06-14 LAB — LACTIC ACID, PLASMA
Lactic Acid, Venous: 1.4 mmol/L (ref 0.5–1.9)
Lactic Acid, Venous: 0.9 mmol/L (ref 0.5–1.9)

## 2022-06-14 LAB — BRAIN NATRIURETIC PEPTIDE: B Natriuretic Peptide: 536 pg/mL — ABNORMAL HIGH (ref 0.0–100.0)

## 2022-06-14 LAB — TROPONIN I (HIGH SENSITIVITY)
Troponin I (High Sensitivity): 15 ng/L (ref <18)
Troponin I (High Sensitivity): 15 ng/L (ref <18)

## 2022-06-14 LAB — MAGNESIUM: Magnesium: 2.5 mg/dL — ABNORMAL HIGH (ref 1.7–2.4)

## 2022-06-14 MED ORDER — PREDNISONE 20 MG PO TABS: 40.0000 mg | ORAL_TABLET | Freq: Every day | ORAL | 0 refills | Status: AC

## 2022-06-14 MED ORDER — ALBUTEROL SULFATE HFA 108 (90 BASE) MCG/ACT IN AERS: 2.0000 | INHALATION_SPRAY | RESPIRATORY_TRACT | Status: DC | PRN

## 2022-06-14 MED ORDER — IOHEXOL 350 MG/ML SOLN
60.0000 mL | Freq: Once | INTRAVENOUS | Status: AC | PRN
  Administered 2022-06-14: 60 mL via INTRAVENOUS

## 2022-06-14 MED ORDER — AMOXICILLIN-POT CLAVULANATE 875-125 MG PO TABS: 1.0000 | ORAL_TABLET | Freq: Two times a day (BID) | ORAL | 0 refills | Status: DC

## 2022-06-14 NOTE — ED Provider Notes (Signed)
89 y.o.[name] female Has h/o HTN, HLD, asthma, CHF, CKD stage 3, primary pulmonary hypertension. Worsening DOE and oxygen demand. Started on supplemental 2L [place] 1 month ago, increased to 3L this week. Has taken torsemide TID. Worsening peripheral edema, significantly worse on RLE, which is new this week. Patient takes eliquis and has been compliant, hasn't missed any doses. Exam demonstrates bibasilar crackles and mildly increased work of breathing, 3+ pitting edema on RLE, 2+ on the left. Patient and son at bedside state that this breathing is normal for her.   CXR and CT PE with cardiomegaly/elevated R heart pressures stable, moderate sized pleural effusions, evidence of bronchitis. No leukocytosis and with no fevers low c/f pneumonia, trop flat,  BNP up from baseline. EKG without ischemic signs. Discussed extensively with patient and her son, patient is very motiviated to go home and does not want to be admitted. She feels she is breathing at baseline, they just came in for the c/f DVT. She is on her home oxygen. No [place] at this facility currently, patient already on eliquis, she is given instructions and orders to return tomorrow morning for DVT ultrasound. Given extensive DC instructions/return precautions, instrucrted to f/u with PCP.   Labs Reviewed  COMPREHENSIVE METABOLIC PANEL - Abnormal; Notable for the following components:      Result Value   Glucose, Bld 115 (*)    BUN 51 (*)    Creatinine, Ser 1.38 (*)    Total Bilirubin 1.3 (*)    GFR, Estimated 37 (*)    All other components within normal limits  CBC WITH DIFFERENTIAL/PLATELET - Abnormal; Notable for the following components:   RBC 3.69 (*)    Hemoglobin 11.7 (*)    Monocytes Absolute 1.1 (*)    All other components within normal limits  MAGNESIUM - Abnormal; Notable for the following components:   Magnesium 2.5 (*)    All other components within normal limits  BRAIN NATRIURETIC PEPTIDE - Abnormal; Notable for the following  components:   B Natriuretic Peptide 536.0 (*)    All other components within normal limits  BLOOD GAS, VENOUS - Abnormal; Notable for the following components:   Bicarbonate 34.2 (*)    Acid-Base Excess 7.8 (*)    All other components within normal limits  RESP PANEL BY RT-PCR (RSV, FLU A&B, COVID)  [id number]  LACTIC ACID, PLASMA  LACTIC ACID, PLASMA  TROPONIN I (HIGH SENSITIVITY)  TROPONIN I (HIGH SENSITIVITY)     DG Chest 1 View  Final Result    CT Angio Chest PE W and/or Wo Contrast    (Results Pending)   Medications  iohexol (OMNIPAQUE) 350 MG/ML injection 60 mL (60 mLs Intravenous Contrast Given 06/14/22 2059)         Audley Hose, MD 06/21/22 1202

## 2022-06-14 NOTE — ED Triage Notes (Signed)
Patient c/o swelling to increased right leg in past week with a bruise to posterior aspect of leg. Patient also is currently being treated by pulmonary for COPD/Asthma. Patient wearing 3L of oxygen via [employer] to keep O2 sats above 90%. Patient also using inhaler. Patient has increased shortness of breath and de-sating with minimal exertion. Patient does have hx of CHF. Denies any chest pain.

## 2022-06-14 NOTE — ED Notes (Signed)
Pt ambulated in hallway on oxygen 3 liters that she wears all the time Oxygen sats maintained at 91-92% on 3 liters while ambulating. When got back to room and patient sat down, oxygen sats dropped to 85-88%

## 2022-06-14 NOTE — ED Provider Notes (Signed)
[facility] Provider Note   CSN: [id number] Arrival date & time: 06/14/22  1613     History  Chief Complaint  Patient presents with   Leg Swelling   Shortness of Breath    [name] [name] is a 89 y.o. female.  89 year old female presents today for evaluation of worsening shortness of breath, increased O2 demand, exertional dyspnea.  Son is at bedside.  Patient also complains of asymmetrical swelling that is worse in the right lower extremity.  She states she also has a bruise on her right calf that she is not sure how she got this.  Reports compliance with all of her medications including Eliquis.  Denies any chest pain lightheadedness.  Denies any wheezing.  States she has history of asthma but not COPD.  She was given the O2 due to hypoxia.  Has had it for about a month.  She states she was prescribed 2 L however was told she could increase it to 3 to keep it above 90% which she has been increasing to 3 L recently.  She has been battling the hypoxia with exertion now for several months.  However the asymmetrical lower extremity swelling is new over the past week.     The history is provided by the patient. No language interpreter was used.       Home Medications Prior to Admission medications   Medication Sig Start Date End Date Taking? Authorizing Provider  acetaminophen (TYLENOL) 500 MG tablet Take 500 mg by mouth every morning.   Yes [provider]  albuterol (PROVENTIL HFA;VENTOLIN HFA) 108 (90 BASE) MCG/ACT inhaler Inhale 2 puffs into the lungs every 6 (six) hours as needed for wheezing or shortness of breath. 07/05/14  Yes Lendon Colonel, NP  amLODipine (NORVASC) 5 MG tablet TAKE (1) TABLET BY MOUTH ONCE DAILY. Patient taking differently: Take 5 mg by mouth daily. 06/30/21  Yes Satira Sark, MD  apixaban (ELIQUIS) 2.5 MG TABS tablet TAKE (1) TABLET BY MOUTH TWICE DAILY. Patient taking differently: Take 2.5 mg by  mouth 2 (two) times daily. 03/16/22  Yes Satira Sark, MD  atorvastatin (LIPITOR) 20 MG tablet TAKE ONE TABLET BY MOUTH ONCE DAILY. 04/10/22  Yes Satira Sark, MD  bisoprolol (ZEBETA) 10 MG tablet Take 1 tablet (10 mg total) by mouth daily. Patient taking differently: Take 5 mg by mouth daily. 04/23/22  Yes Tanda Rockers, MD  Calcium Carbonate-Vitamin D (CALCIUM 600 + D PO) Take 1 tablet by mouth 2 (two) times daily.   Yes [provider]  Cholecalciferol (VITAMIN D) 2000 UNITS CAPS Take 2,000 Units by mouth at bedtime.   Yes [provider]  dofetilide (TIKOSYN) 125 MCG capsule TAKE (1) CAPSULE BY MOUTH TWICE DAILY. Patient taking differently: Take 125 mcg by mouth 2 (two) times daily. 04/29/22  Yes Evans Lance, MD  fluticasone furoate-vilanterol (BREO ELLIPTA) 100-25 MCG/ACT AEPB Inhale 1 puff into the lungs daily. 05/22/22  Yes Parrett, Fonnie Mu, NP  folic acid (FOLVITE) 1 MG tablet Take 1 mg by mouth 2 (two) times daily.   Yes [provider]  losartan (COZAAR) 100 MG tablet TAKE (1) TABLET BY MOUTH DAILY. Patient taking differently: Take 100 mg by mouth daily. 03/31/22  Yes Satira Sark, MD  Polyethyl Glycol-Propyl Glycol (SYSTANE OP) Place 1 drop into both eyes daily as needed (Dry eyes).   Yes [provider]  potassium chloride SA (KLOR-CON M) 20  MEQ tablet TAKE (1) TABLET BY MOUTH 3 TIMES DAILY. Patient taking differently: Take 20 mEq by mouth 3 (three) times daily. Pt takes twice daily 12/05/21  Yes Satira Sark, MD  torsemide (DEMADEX) 20 MG tablet TAKE 3 TABLETS BY MOUTH DAILY. 03/31/22  Yes Satira Sark, MD  triamcinolone cream (KENALOG) 0.1 % Apply 1 application. topically daily as needed (itching).   Yes [provider]      Allergies    Codeine, Fentanyl, and Keflex [cephalexin]    Review of Systems   Review of Systems  Constitutional:  Negative for chills and fever.  Respiratory:  Positive for  shortness of breath. Negative for cough.   Cardiovascular:  Positive for leg swelling. Negative for chest pain and palpitations.  Gastrointestinal:  Negative for abdominal pain and nausea.  Neurological:  Negative for light-headedness and headaches.  All other systems reviewed and are negative.   Physical Exam Updated Vital Signs BP 126/68   Pulse (!) 59   Temp 97.9 F (36.6 C)   Resp (!) 31   Ht '5\' 1"'$  (1.549 m)   Wt 72.6 kg   SpO2 95%   BMI 30.23 kg/m  Physical Exam Vitals and nursing note reviewed.  Constitutional:      General: She is not in acute distress.    Appearance: Normal appearance. She is ill-appearing (Chronically ill-appearing).  HENT:     Head: Normocephalic and atraumatic.     Nose: Nose normal.  Eyes:     General: No scleral icterus.    Extraocular Movements: Extraocular movements intact.     Conjunctiva/sclera: Conjunctivae normal.  Cardiovascular:     Rate and Rhythm: Normal rate and regular rhythm.     Pulses: Normal pulses.  Pulmonary:     Effort: Pulmonary effort is normal. No respiratory distress.     Breath sounds: Rales (bibasilar) present. No wheezing.     Comments: Does have mild increase and work of breathing.  According to her son this is baseline for her Abdominal:     General: There is no distension.     Tenderness: There is no abdominal tenderness.  Musculoskeletal:        General: Normal range of motion.     Cervical back: Normal range of motion.     Right lower leg: Edema (3+/4+) present.     Left lower leg: Edema (2+) present.  Skin:    General: Skin is warm and dry.  Neurological:     General: No focal deficit present.     Mental Status: She is alert. Mental status is at baseline.     ED Results / Procedures / Treatments   Labs (all labs ordered are listed, but only abnormal results are displayed) Labs Reviewed  COMPREHENSIVE METABOLIC PANEL - Abnormal; Notable for the following components:      Result Value   Glucose,  Bld 115 (*)    BUN 51 (*)    Creatinine, Ser 1.38 (*)    Total Bilirubin 1.3 (*)    GFR, Estimated 37 (*)    All other components within normal limits  CBC WITH DIFFERENTIAL/PLATELET - Abnormal; Notable for the following components:   RBC 3.69 (*)    Hemoglobin 11.7 (*)    Monocytes Absolute 1.1 (*)    All other components within normal limits  MAGNESIUM - Abnormal; Notable for the following components:   Magnesium 2.5 (*)    All other components within normal limits  RESP PANEL BY RT-PCR (  RSV, FLU A&B, COVID)  RVPGX2  LACTIC ACID, PLASMA  BRAIN NATRIURETIC PEPTIDE  BLOOD GAS, VENOUS  LACTIC ACID, PLASMA  TROPONIN I (HIGH SENSITIVITY)    EKG None  Radiology DG Chest 1 View  Result Date: 06/14/2022 CLINICAL DATA:  Shortness of breath EXAM: CHEST  1 VIEW COMPARISON:  04/23/2022 FINDINGS: Left-sided implanted cardiac device remains in place. Stable heart size status post sternotomy and CABG. Aortic atherosclerosis. Low lung volumes. Small bilateral pleural effusions with hazy bibasilar airspace opacities. No pneumothorax. IMPRESSION: Small bilateral pleural effusions with hazy bibasilar airspace opacities, which may represent atelectasis versus pneumonia. Electronically Signed   By: Davina Poke D.O.   On: 06/14/2022 17:41    Procedures Procedures    Medications Ordered in ED Medications  albuterol (VENTOLIN HFA) 108 (90 Base) MCG/ACT inhaler 2 puff (has no administration in time range)    ED Course/ Medical Decision Making/ A&P                             Medical Decision Making Amount and/or Complexity of Data Reviewed Labs: ordered. Radiology: ordered.  Risk Prescription drug management.   Medical Decision Making / ED Course   This patient presents to the ED for concern of shortness of breath, leg swelling this involves an extensive number of treatment options, and is a complaint that carries with it a high risk of complications and morbidity.  The  differential diagnosis includes CHF exacerbation, pneumonia, asthma exacerbation, COPD exacerbation, PE, DVT  MDM: 89 year old female presents today for evaluation of leg swelling, shortness of breath.  She is anticoagulated.  Shortness of breath and hypoxia has been a longstanding issue for her and she is following with pulmonology outpatient.  Patient and son is predominantly worried about the right lower extremity swelling.  They are worried about a DVT.  She is compliant and on Eliquis.  She is also compliant with her torsemide.  Her BNP is elevated however no significant signs of pulmonary edema on workup today.  Chest x-ray and CTA chest PE study did not show significant pulmonary edema.  CBC is without leukocytosis, patient is afebrile.  Low suspicion for pneumonia.  Hemoglobin at baseline.  Without lactic acidosis.  Troponin negative x 2.  Respiratory panel negative.  CMP with mildly elevated creatinine at 1.38.  VBG without acute concerns.  EKG without acute ischemic changes.  CTA chest does show evidence of bronchitis.  Given multiple comorbidities will start patient on antibiotic as well as prednisone.  Advised patient to stop her 5 mg prednisone until she completes the high-dose steroid.  They are in agreement.  Discussed calling the pulmonology clinic first thing in the morning.  They are in agreement with this.  Admission offered however they deferred.  Given this is not a new issue and she has been dealing with the exertional hypoxia for quite some time and has been seen by pulmonology multiple times feel this is not unreasonable.  Patient discharged in stable condition.  She ambulated and maintained O2 sats at least 91%.  She briefly desatted once she sat down when she returned to the bed to about 85% with immediate recovery to the O2 sats of 90s. Discussed with attending who is in agreement.  Patient evaluated at bedside by attending.   Lab Tests: -I ordered, reviewed, and interpreted labs.    The pertinent results include:   Labs Reviewed  COMPREHENSIVE METABOLIC PANEL - Abnormal; Notable for  the following components:      Result Value   Glucose, Bld 115 (*)    BUN 51 (*)    Creatinine, Ser 1.38 (*)    Total Bilirubin 1.3 (*)    GFR, Estimated 37 (*)    All other components within normal limits  CBC WITH DIFFERENTIAL/PLATELET - Abnormal; Notable for the following components:   RBC 3.69 (*)    Hemoglobin 11.7 (*)    Monocytes Absolute 1.1 (*)    All other components within normal limits  MAGNESIUM - Abnormal; Notable for the following components:   Magnesium 2.5 (*)    All other components within normal limits  BLOOD GAS, VENOUS - Abnormal; Notable for the following components:   Bicarbonate 34.2 (*)    Acid-Base Excess 7.8 (*)    All other components within normal limits  RESP PANEL BY RT-PCR (RSV, FLU A&B, COVID)  RVPGX2  LACTIC ACID, PLASMA  BRAIN NATRIURETIC PEPTIDE  LACTIC ACID, PLASMA  TROPONIN I (HIGH SENSITIVITY)  TROPONIN I (HIGH SENSITIVITY)      EKG  EKG Interpretation  Date/Time:    Ventricular Rate:    PR Interval:    QRS Duration:   QT Interval:    QTC Calculation:   R Axis:     Text Interpretation:           Imaging Studies ordered: I ordered imaging studies including chest x-ray, CTA chest PE study I independently visualized and interpreted imaging. I agree with the radiologist interpretation   Medicines ordered and prescription drug management: Meds ordered this encounter  Medications   albuterol (VENTOLIN HFA) 108 (90 Base) MCG/ACT inhaler 2 puff    -I have reviewed the patients home medicines and have made adjustments as needed   Reevaluation: After the interventions noted above, I reevaluated the patient and found that they have :stayed the same  Co morbidities that complicate the patient evaluation  Past Medical History:  Diagnosis Date   Anxiety    Asthma    Chronic atrial fibrillation (HCC)    Chronic back  pain    Chronic diastolic heart failure ([place])    Chronic obstructive pulmonary disease, unspecified (HCC)    Complete heart block (HCC)    COPD (chronic obstructive pulmonary disease) ([place])    Coronary atherosclerosis of native coronary artery    Multivessel status post CABG, DES PLA March 2006   DDD (degenerative disc disease), lumbar    Essential hypertension    Headache    ICD (implantable cardioverter-defibrillator) in place    Mixed hyperlipidemia    Osteoarthritis    Ventricular fibrillation ([place]) 2003   a. seen on PPM interrogation a/w syncope      Dispostion: Patient discharged in stable condition.  Return precaution discussed.  Outpatient DVT study ordered at the request given the asymmetrical swelling.   Final Clinical Impression(s) / ED Diagnoses Final diagnoses:  Peripheral edema  Dyspnea, unspecified type  Bronchitis    Rx / DC Orders ED Discharge Orders          Ordered    VAS [place] LOWER EXTREMITY VENOUS (DVT)        06/14/22 2247    amoxicillin-clavulanate (AUGMENTIN) 875-125 MG tablet  Every 12 hours        06/14/22 2248    predniSONE (DELTASONE) 20 MG tablet  Daily with breakfast        06/14/22 2248              Evlyn Courier,  PA-C 06/14/22 2304    Audley Hose, MD 06/21/22 1155

## 2022-06-14 NOTE — Discharge Instructions (Addendum)
Your workup today showed you have bronchitis.  CT scan did not show evidence of pneumonia or blood clot.  I did order an ultrasound of your leg to be done at another day given we do not have ultrasound here tonight.  You can come back tomorrow or Tuesday as you are planning.  Please call the pulmonology office tomorrow morning to schedule a close follow-up appointment.  We discussed admission however you prefer to be discharged and to follow-up outpatient.  For any concerning or worsening symptoms please return to the emergency department.

## 2022-06-14 NOTE — ED Notes (Signed)
Patient transported to CT 

## 2022-06-15 ENCOUNTER — Ambulatory Visit (HOSPITAL_COMMUNITY)
Admission: RE | Admit: 2022-06-15 | Discharge: 2022-06-15 | Disposition: A | Discharge status: Home / Self Care | Payer: Medicare PPO | Source: Ambulatory Visit | Attending: Emergency Medicine | Admitting: Emergency Medicine

## 2022-06-15 ENCOUNTER — Telehealth: Payer: Self-pay

## 2022-06-15 DIAGNOSIS — M7989 Other specified soft tissue disorders: Secondary | ICD-10-CM | POA: Diagnosis not present

## 2022-06-15 DIAGNOSIS — R600 Localized edema: Secondary | ICD-10-CM | POA: Insufficient documentation

## 2022-06-15 NOTE — Telephone Encounter (Signed)
Yes that's fine 

## 2022-06-15 NOTE — Telephone Encounter (Signed)
Patient was seen in ED and was prescribed prednisone and  augmentin and wants to make sure she is ok to take this with current medications.  Has an appt Wednesday pm

## 2022-06-15 NOTE — Telephone Encounter (Signed)
Called and notified Jenny Reichmann (pts son ok per dpr) of response. Nothing further needed at this time.

## 2022-06-17 ENCOUNTER — Ambulatory Visit: Payer: Medicare PPO | Admitting: Internal Medicine

## 2022-06-17 ENCOUNTER — Encounter: Payer: Self-pay | Admitting: Internal Medicine

## 2022-06-17 VITALS — BP 130/78 | HR 70 | Ht 61.0 in | Wt 163.4 lb

## 2022-06-17 DIAGNOSIS — R0902 Hypoxemia: Secondary | ICD-10-CM

## 2022-06-17 DIAGNOSIS — R0609 Other forms of dyspnea: Secondary | ICD-10-CM | POA: Diagnosis not present

## 2022-06-17 MED ORDER — TRELEGY ELLIPTA 100-62.5-25 MCG/ACT IN AEPB: 1.0000 | INHALATION_SPRAY | Freq: Every day | RESPIRATORY_TRACT | 0 refills | Status: DC

## 2022-06-17 NOTE — Patient Instructions (Addendum)
Make sure you check your oxygen saturation  AT  your highest level of activity (not after you stop)   to be sure it stays over 90% and adjust  02 flow upward to maintain this level if needed but remember to turn it back to previous settings when you stop (to conserve your supply).   My office will be contacting you by phone for referral to [facility] asap   - if you don't hear back from my office within one week please call us back or notify us thru MyChart and we'll address it right away.   Elevate legs when sleeping and and as much as you can during the day   Trelegy [id number] one click each am x 2 weeks then return to clinic - cancel prior appts    If worse breathing or can't keep sats ver 90% at rest on max 02 > go to [place]

## 2022-06-17 NOTE — Progress Notes (Unsigned)
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 Etiology  with prior pfts 2016 showing mild aiflow obst not better on saba.  Pt unsure of baseline wt   Reported sob only with severe exertion at pfts 2016    History of Present Illness  04/23/2022  Pulmonary/ 1st office eval/ Clyda Smyth / [facility]  Chief Complaint  Patient presents with   Consult    SOB- O2 desaturations.  Patient was 87% room air after walking from lobby to room but stated she did not want to be on O2   Dyspnea:  100 ft / can still push cart at [employer]/ mb is 75 ft slt down to it.  Cough: started one day prior to OV  / minimal mucoid not noct  Sleep: flat in bed one pillow ok  SABA use: once a day but very poor technique  02: does not use it  Rec Stop lopressor and start biosoprolol 10 mg daily to see what difference this makes Work on inhaler technique:  Only use your albuterol as a rescue medication  Also  Ok to try albuterol 15 min before an activity (on alternating days)  that you know would usually make you short of breath     - PFT's  05/22/22  FEV1 0.79 (57 % ) ratio 0.61  p 11 % improvement from saba p ? prior to study with DLCO  8.36 (50%)   and FV curve min concave     06/17/2022  f/u ov/[place] office/[facility] [place] re: chronic asthma/ ex desats  maint on Breo 100 daily   Chief Complaint  Patient presents with   Follow-up    06/14/2022 For breathing and leg swelling  Have been titrating O2 to keep sats above 90%  Breathing is worse   Dyspnea:  mostly housebound / sob to bathroom/ not checking sats with activity  Cough: congestion back of throat / no significant production  Sleeping: recliner one night prior 30 degrees  SABA use: not really use hfa  02: 24/7 at 3lpm  Covid status: vax max/ neve infected      No obvious day  to day or daytime variability or assoc excess/ purulent sputum or mucus plugs or hemoptysis or cp or chest tightness, subjective wheeze or overt sinus or hb symptoms.     Also denies any obvious fluctuation of symptoms with weather or environmental changes or other aggravating or alleviating factors except as outlined above   No unusual exposure hx or h/o childhood pna/ asthma or knowledge of premature birth.  Current Allergies, Complete Past Medical History, Past Surgical History, Family History, and Social History were reviewed in [employer] record.  ROS  The following are not active complaints unless bolded Hoarseness, sore throat, dysphagia, dental problems, itching, sneezing,  nasal congestion or discharge of excess mucus or purulent secretions, ear ache,   fever, chills, sweats, unintended wt loss or wt gain, classically pleuritic or exertional cp,  orthopnea pnd or arm/hand swelling  or leg swelling, presyncope, palpitations, abdominal pain, anorexia, nausea, vomiting, diarrhea  or change in bowel habits or change in bladder habits, change in stools or change in urine, dysuria, hematuria,  rash, arthralgias, visual complaints, headache, numbness, weakness or ataxia or problems with walking or  coordination,  change in mood or  memory.        Current Meds  Medication Sig   acetaminophen (TYLENOL) 500 MG tablet Take 500 mg by mouth every morning.   albuterol (PROVENTIL HFA;VENTOLIN HFA) 108 (90 BASE) MCG/ACT inhaler Inhale 2 puffs into the lungs every 6 (six) hours as needed for wheezing or shortness of breath.   amLODipine (NORVASC) 5 MG tablet TAKE (1) TABLET BY MOUTH ONCE DAILY. (Patient taking differently: Take 5 mg by mouth daily.)   amoxicillin-clavulanate (AUGMENTIN) 875-125 MG tablet Take 1 tablet by mouth every 12 (twelve) hours.   apixaban (ELIQUIS) 2.5 MG TABS tablet TAKE (1) TABLET BY MOUTH TWICE DAILY. (Patient taking differently: Take 2.5 mg by mouth 2  (two) times daily.)   atorvastatin (LIPITOR) 20 MG tablet TAKE ONE TABLET BY MOUTH ONCE DAILY.   bisoprolol (ZEBETA) 10 MG tablet Take 1 tablet (10 mg total) by mouth daily. (Patient taking differently: Take 5 mg by mouth daily.)   Calcium Carbonate-Vitamin D (CALCIUM 600 + D PO) Take 1 tablet by mouth 2 (two) times daily.   Cholecalciferol (VITAMIN D) 2000 UNITS CAPS Take 2,000 Units by mouth at bedtime.   dofetilide (TIKOSYN) 125 MCG capsule TAKE (1) CAPSULE BY MOUTH TWICE DAILY. (Patient taking differently: Take 125 mcg by mouth 2 (two) times daily.)   fluticasone furoate-vilanterol (BREO ELLIPTA) 100-25 MCG/ACT AEPB Inhale 1 puff into the lungs daily.   folic acid (FOLVITE) 1 MG tablet Take 1 mg by mouth 2 (two) times daily.   losartan (COZAAR) 100 MG tablet TAKE (1) TABLET BY MOUTH DAILY. (Patient taking differently: Take 100 mg by mouth daily.)   Polyethyl Glycol-Propyl Glycol (SYSTANE OP) Place 1 drop into both eyes daily as needed (Dry eyes).   potassium chloride SA (KLOR-CON M) 20 MEQ tablet TAKE (1) TABLET BY MOUTH 3 TIMES DAILY. (Patient taking differently: Take 20 mEq by mouth 3 (three) times daily. Pt takes twice daily)   predniSONE (DELTASONE) 20 MG tablet Take 2 tablets (40 mg total) by mouth daily with breakfast for 5 days.   torsemide (DEMADEX) 20 MG tablet TAKE 3 TABLETS BY MOUTH DAILY.   triamcinolone cream (KENALOG) 0.1 % Apply 1 application. topically daily as needed (itching).                 Past Medical History:  Diagnosis Date   Anxiety    Chronic atrial fibrillation (HCC)    Chronic back pain    Chronic diastolic heart failure (HCC)    Chronic obstructive pulmonary disease, unspecified (HCC)    Complete heart block (HCC)    COPD (chronic obstructive pulmonary disease) (HCC)    Coronary atherosclerosis of native coronary artery    Multivessel status post CABG, DES PLA March 2006   DDD (degenerative disc disease), lumbar    Essential hypertension    Headache     ICD (implantable cardioverter-defibrillator) in place    Mixed hyperlipidemia    Osteoarthritis    Ventricular fibrillation ([place]) 2003   a. seen on PPM interrogation a/w syncope       Objective:    Wt Readings from Last 3 Encounters:  06/17/22 163 lb 6.4 oz (74.1 kg)  06/14/22 160 lb (72.6 kg)  05/22/22 160 lb (72.6 kg)     Vital signs reviewed  06/17/2022  - Note at rest 02 sats  90% on 3lpm cont    General appearance:    w/c bound elderly wf nad at res  HEENT : Oropharynx  clear        NECK :  without  apparent JVD/ palpable Nodes/TM    LUNGS: no acc muscle use, mod kyphotic chest contour with decreased bs/ dullness both bases    CV:  RRR  no s3 or murmur or increase in P2, and  2+ ptting R / 1+ L  ABD:  soft and nontender with nl inspiratory excursion in the supine position. No bruits or organomegaly appreciated   MS:  ext warm without deformities Or obvious joint restrictions  calf tenderness, cyanosis or clubbing    SKIN: warm and dry with cellulitic changes R LE  NEURO:  alert, approp, nl sensorium with  no motor or cerebellar deficits apparent.        I personally reviewed images and agree with radiology impression as follows:   Chest CTa   06/14/22 . No evidence of arterial dilatation or embolus. 2. Cardiomegaly with elevated RV/LV ratio and IVC reflux in keeping with tricuspid regurgitation and/or elevated right heart pressures. No overt edema findings in the lungs. 3. Moderate-sized layering pleural effusions with overlying compressive atelectasis or consolidation in the lower lobes. 4. Mild diffuse bronchial thickening which could be due to reactive airways disease or bronchitis. 5. Aortic and coronary artery atherosclerosis. Prior CABG. Pacemaker. 6. Minimal upper abdominal ascites. 7. Small hiatal hernia. 8. 3 new nodules in the upper lobes. Largest 2 are 5 mm. Per Fleischner Society Guidelines, if patient is low risk for malignancy, no routine  follow-up imaging is recommended. If patient is high risk for malignancy, a non-contrast Chest CT at 12 months is optional. If performed and the nodule is stable at 12 months, no further follow-up is recommended.      albumin 4.2 on 06/14/22   Lab Results  Component Value Date   TSH 2.810 04/23/2022                  Assessment

## 2022-06-18 ENCOUNTER — Encounter: Payer: Self-pay | Admitting: Internal Medicine

## 2022-06-18 NOTE — Assessment & Plan Note (Addendum)
04/23/2022  desat to 84% p 150 ft and declined repeating study on amb 02   Advised for now 3lpm 24/7 bu increase with activity with goal > 90%  F/u 2 weeks          Each maintenance medication was reviewed in detail including emphasizing most importantly the difference between maintenance and prns and under what circumstances the prns are to be triggered using an action plan format where appropriate.  Total time for H and P, chart review, counseling, reviewing dpi/02  device(s) and generating customized AVS unique to this office visit / same day charting  > 30 min for multiple  refractory respiratory  symptoms of uncertain etiology

## 2022-06-18 NOTE — Assessment & Plan Note (Addendum)
Onset was "with heart problems"  but improved p cabg / rehab  - never smoker - Echo [id number] mod diastolic dysfunction  - PFT's  @ wt 170  11/07/14  FEV1 1.14 (69 % ) ratio 0.68  p 8 % improvement from saba p nothing prior to study with DLCO  10.72 (53%)   and FV curve mildly concave    - desat with walking 04/23/2022  see ex hypoxemia - PFT's @ wt 160  05/22/22  FEV1 0.79 (57 % ) ratio 0.61  p 11 % improvement from saba p ? prior to study with DLCO  8.36 (50%)   and FV curve min concave   - CTa2/25/24 c/w R>L effusion / atx  - Echo 06/17/2022 >>>  She has a pattern suggesting ACOS in never smoker and now with significant bilateral effusions likely chf related and would benefit from aldactone if renal function allows  NB Note that pleural effusion and copd have the same effect on insp muscles/mechanics (both shorten their length prior to inspiration making them weaker with less force reserve) so they are synergistic in causing sob.   So will eval effusions and rx as copd with trelegy x 2 week trial   - The proper method of use, as well as anticipated side effects, of a metered-dose inhaler were discussed and demonstrated to the patient using teach back method. Improved effectiveness after extensive coaching during this visit to a level of approximately 50 % from a baseline of 25% (weak Insp force/flows)

## 2022-06-19 ENCOUNTER — Telehealth: Payer: Self-pay

## 2022-06-19 NOTE — Telephone Encounter (Signed)
        Patient  visited [facility] on 2/26     Telephone encounter attempt :  1st  A HIPAA compliant voice message was left requesting a return call.  Instructed patient to call back   [facility] [phone] [address], [place], [place] [address] Phone: [phone] Email: [email or web address]

## 2022-06-22 ENCOUNTER — Telehealth: Payer: Self-pay

## 2022-06-22 ENCOUNTER — Telehealth: Payer: Self-pay | Admitting: Internal Medicine

## 2022-06-22 ENCOUNTER — Telehealth: Payer: Self-pay | Admitting: Cardiology

## 2022-06-22 DIAGNOSIS — I10 Essential (primary) hypertension: Secondary | ICD-10-CM

## 2022-06-22 NOTE — Telephone Encounter (Signed)
Pt c/o swelling: STAT is pt has developed SOB within 24 hours  If swelling, where is the swelling located?   Feet, ankles, calves  How much weight have you gained and in what time span?   13 lbs in 2 weeks,   Have you gained 3 pounds in a day or 5 pounds in a week?   2 lbs in 1 week  Do you have a log of your daily weights (if so, list)?   Yes  Are you currently taking a fluid pill?   Yes - Demidex 20 mg (3 pills daily)  Are you currently SOB?   Yes, daughter notes patient is on oxygen constantly  Have you traveled recently?   No   Daughter states patient has been having significant weight gain and wants to know next steps as patient does not want to take more diuretics and continues to have weight gain since 1/24 when patient weighed 154.  Daughter notes patient's current weight is 167.2.

## 2022-06-22 NOTE — Telephone Encounter (Signed)
        Patient  visited [facility] on 2/25    Telephone encounter attempt :  2nd Patient declined call    [facility], [place] [phone] [address], [place], [place] [address] Phone: [phone] Email: [email or web address]

## 2022-06-22 NOTE — Telephone Encounter (Signed)
Spoke with pt's daughter who states that has increased SOB and Oxygen has been increased to 3 LPM. Daughter reports that pt has gained 13 lbs in 2 months not 2 weeks. She has gained 4 pounds in the last week. The has edema to lower ext. Daughter reports that she is currently taking Demadex 60 mg daily as prescribed. Please advise.

## 2022-06-22 NOTE — Telephone Encounter (Signed)
Called and spoke with patients son. He verbalized understanding.   Nothing further needed.

## 2022-06-22 NOTE — Telephone Encounter (Signed)
Pt. Son is calling about her weight gain in last week since her Dr. visit and her wanting to eat is not good he said

## 2022-06-22 NOTE — Telephone Encounter (Signed)
Called and spoke with patients son. He stated that he wanted to see where we were with the echocardiogram for patient. He also stated that he was worried about her weight gain as well.   MW, please advise.

## 2022-06-22 NOTE — Telephone Encounter (Signed)
It's been ordered but see if we can help expedite and whoever is treating her with diuretics (name on bottle) should be aware of the wt gain /fluid retention to [facility]

## 2022-06-23 NOTE — Telephone Encounter (Signed)
Daughter Beth notified of Dr. McDowell's response. She voiced understanding

## 2022-06-24 ENCOUNTER — Telehealth: Payer: Self-pay | Admitting: Cardiology

## 2022-06-24 MED ORDER — TORSEMIDE 20 MG PO TABS: 80.0000 mg | ORAL_TABLET | Freq: Every day | ORAL | 3 refills | Status: DC

## 2022-06-24 NOTE — Addendum Note (Signed)
Addended by: Christella Scheuermann C on: 06/24/2022 12:06 PM   Modules accepted: Orders

## 2022-06-24 NOTE — Telephone Encounter (Signed)
Medication sent to pharmacy per pt's son request.

## 2022-06-24 NOTE — Telephone Encounter (Signed)
  Pt c/o medication issue:  1. Name of Medication: torsemide (DEMADEX) 20 MG tablet   2. How are you currently taking this medication (dosage and times per day)?   TAKE 3 TABLETS BY MOUTH DAILY.    3. Are you having a reaction (difficulty breathing--STAT)? No   4. What is your medication issue? Pt's son requesting to get a new prescription send to [facility]. Dr. Domenic Polite increased pt's dose to take 4 tablets a day. They need 90 days supply 360 tablets

## 2022-06-26 ENCOUNTER — Telehealth: Payer: Self-pay | Admitting: *Deleted

## 2022-06-26 NOTE — Telephone Encounter (Signed)
Authorization has been obtained.  Documented on appt note & left a message for RT to reach out to [name] to sched.  Called [name] & updated-he verbalized understanding & nothing further needed at this time.

## 2022-06-29 ENCOUNTER — Ambulatory Visit: Payer: Medicare PPO | Admitting: Orthopedic Surgery

## 2022-07-01 ENCOUNTER — Telehealth: Payer: Self-pay | Admitting: Cardiology

## 2022-07-01 ENCOUNTER — Encounter: Payer: Self-pay | Admitting: Cardiology

## 2022-07-01 MED ORDER — AMLODIPINE BESYLATE 2.5 MG PO TABS: 2.5000 mg | ORAL_TABLET | Freq: Every day | ORAL | 3 refills | Status: DC

## 2022-07-01 NOTE — Telephone Encounter (Signed)
Spoke to pt's son who stated that pt is still having some swelling in her legs. Pt's weight on 1/4- 154 lb, 3/7- 165 lb, 3/13- 167.4 lb. Pts son stated that pt is not eating much. Son stated that pt's right calf is worse than her left, as she has a huge, dark bruise on the inside of her calf, which she is unsure how she acquired it. Pt's son stated that pt has been elevating feet when able to. Pt has an appt with Dr. Melvyn Novas on 3/14. Pt bp has been:  3/9-97/47 3/10- 106/76 3/12- 124/59 3/13- 123/72  Pt's son stated that O2 and hr have been with in normal range.   Please advise.

## 2022-07-01 NOTE — Telephone Encounter (Signed)
Pt's son notified of changes and verbalized understanding. Pts son had no other questions at this time.

## 2022-07-01 NOTE — Telephone Encounter (Signed)
Pt c/o swelling: STAT is pt has developed SOB within 24 hours  How much weight have you gained and in what time span? 2 1/2 since last med change.   If swelling, where is the swelling located? Both legs   Are you currently taking a fluid pill? Yes   Are you currently SOB? Yes, but nothing that is new.   Do you have a log of your daily weights (if so, list)? N/a   Have you gained 3 pounds in a day or 5 pounds in a week? 2 1/2   Have you traveled recently? No    Patient seems to have swelling increase in both lower legs, but now seems to be going up. Son has also notice large bruise on right leg behind knee. Requesting call back to discuss.

## 2022-07-01 NOTE — Telephone Encounter (Signed)
Error

## 2022-07-02 ENCOUNTER — Ambulatory Visit: Payer: Medicare PPO | Admitting: Internal Medicine

## 2022-07-02 ENCOUNTER — Encounter: Payer: Self-pay | Admitting: Internal Medicine

## 2022-07-02 ENCOUNTER — Ambulatory Visit (HOSPITAL_COMMUNITY)
Admission: RE | Admit: 2022-07-02 | Discharge: 2022-07-02 | Disposition: A | Discharge status: Home / Self Care | Payer: Medicare PPO | Source: Ambulatory Visit | Attending: Internal Medicine | Admitting: Internal Medicine

## 2022-07-02 VITALS — BP 132/84 | HR 76 | Ht 61.0 in | Wt 163.6 lb

## 2022-07-02 DIAGNOSIS — J9611 Chronic respiratory failure with hypoxia: Secondary | ICD-10-CM

## 2022-07-02 DIAGNOSIS — R0609 Other forms of dyspnea: Secondary | ICD-10-CM | POA: Diagnosis not present

## 2022-07-02 DIAGNOSIS — L03115 Cellulitis of right lower limb: Secondary | ICD-10-CM

## 2022-07-02 DIAGNOSIS — J9811 Atelectasis: Secondary | ICD-10-CM | POA: Diagnosis not present

## 2022-07-02 DIAGNOSIS — R0602 Shortness of breath: Secondary | ICD-10-CM | POA: Diagnosis not present

## 2022-07-02 DIAGNOSIS — J90 Pleural effusion, not elsewhere classified: Secondary | ICD-10-CM | POA: Diagnosis not present

## 2022-07-02 MED ORDER — DOXYCYCLINE HYCLATE 100 MG PO TABS: 100.0000 mg | ORAL_TABLET | Freq: Two times a day (BID) | ORAL | 0 refills | Status: DC

## 2022-07-02 NOTE — Patient Instructions (Addendum)
Doxycycline 100 mg twice daily x 10 days   Please remember to go to the  x-ray department  @  [facility] for your tests - we will call you with the results when they are available     Keep prior appt

## 2022-07-02 NOTE — Assessment & Plan Note (Signed)
Advised:  Make sure you check your oxygen saturation  AT  your highest level of activity (not after you stop)   to be sure it stays over 90% and adjust  02 flow upward to maintain this level if needed but remember to turn it back to previous settings when you stop (to conserve your supply).

## 2022-07-02 NOTE — Progress Notes (Signed)
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 Etiology  with prior pfts 2016 showing mild aiflow obst not better on saba.  Pt unsure of baseline wt   Reported sob only with severe exertion at pfts 2016 @ wt 170    History of Present Illness  04/23/2022  Pulmonary/ 1st [name] [name] eval/ [name] [name] [name] / [facility] [name] [name]  Chief Complaint  Patient presents with   Consult    SOB- O2 desaturations.  Patient was 87% room air after walking from lobby to room but stated she did not want to be on O2   Dyspnea:  100 ft / can still push cart at [employer]/ mb is 75 ft slt down to it.  Cough: started one day prior to OV  / minimal mucoid not noct  Sleep: flat in bed one pillow ok  SABA use: once a day but very poor technique  02: does not use it  Rec Stop lopressor and start biosoprolol 10 mg daily to see what difference this makes Work on inhaler technique:  Only use your albuterol as a rescue medication  Also  Ok to try albuterol 15 min before an activity (on alternating days)  that you know would usually make you short of breath     - PFT's  05/22/22  FEV1 0.79 (57 % ) ratio 0.61  p 11 % improvement from saba p ? prior to study with DLCO  8.36 (50%)   and FV curve min concave     06/17/2022  f/u ov/[name] [name] [name]/[name] [name] [name] re: chronic asthma/ ex desats  maint on Breo 100 daily   Chief Complaint  Patient presents with   Follow-up    06/14/2022 For breathing and leg swelling  Have been titrating O2 to keep sats above 90%  Breathing is worse   Dyspnea:  mostly housebound / sob to bathroom/ not checking sats with activity  Cough: congestion back of throat / no significant production  Sleeping: slept in recliner one night prior @ 30 degrees  SABA use: not really using  hfa  02: 24/7 at 3lpm  Covid status: vax max/ neve infected   Rec Make sure you check your oxygen saturation  AT  your highest level of activity (not after you stop)   to be sure it stays over 90%   Elevate legs when sleeping and and as much as you can during the day  Trelegy [id number] one click each am x 2 weeks then return to clinic   If worse breathing or can't keep sats over 90% at rest on max 02 > go to [facility]    07/02/2022  f/u ov/[place] [name] [name]/[name] [name] [name] re: bilateral effusions/ chronic 02 resp failure maint on trelegy 100   Chief Complaint  Patient presents with   Follow-up    Echo scheduled next week  Breathing about the same     Dyspnea:  on 02 3lpm room to room is all she can do   Cough: cough/ tickle / squeaky voice  Sleeping: sleeping in lift 30 degrees  SABA use: none 02: 3lpm      No obvious day to day or daytime variability or assoc excess/ purulent sputum or mucus plugs or hemoptysis or cp or chest tightness, subjective wheeze or overt sinus or  hb symptoms.   Sleeping  without nocturnal  or early am exacerbation  of respiratory  c/o's or need for noct saba. Also denies any obvious fluctuation of symptoms with weather or environmental changes or other aggravating or alleviating factors except as outlined above   No unusual exposure hx or h/o childhood pna/ asthma or knowledge of premature birth.  Current Allergies, Complete Past Medical History, Past Surgical History, Family History, and Social History were reviewed in [employer] record.  ROS  The following are not active complaints unless bolded Hoarseness, sore throat, dysphagia, dental problems, itching, sneezing,  nasal congestion or discharge of excess mucus or purulent secretions, ear ache,   fever, chills, sweats, unintended wt loss or wt gain, classically pleuritic or exertional cp,  orthopnea pnd or arm/hand swelling  or leg swelling L >R , presyncope, palpitations, abdominal pain, anorexia, nausea, vomiting, diarrhea  or change in bowel habits or  change in bladder habits, change in stools or change in urine, dysuria, hematuria,  rash, arthralgias, visual complaints, headache, numbness, weakness or ataxia or problems with walking or coordination,  change in mood or  memory.        Current Meds  Medication Sig   acetaminophen (TYLENOL) 500 MG tablet Take 500 mg by mouth every morning.   albuterol (PROVENTIL HFA;VENTOLIN HFA) 108 (90 BASE) MCG/ACT inhaler Inhale 2 puffs into the lungs every 6 (six) hours as needed for wheezing or shortness of breath.   amLODipine (NORVASC) 2.5 MG tablet Take 1 tablet (2.5 mg total) by mouth daily.   amoxicillin-clavulanate (AUGMENTIN) 875-125 MG tablet Take 1 tablet by mouth every 12 (twelve) hours.   apixaban (ELIQUIS) 2.5 MG TABS tablet TAKE (1) TABLET BY MOUTH TWICE DAILY. (Patient taking differently: Take 2.5 mg by mouth 2 (two) times daily.)   atorvastatin (LIPITOR) 20 MG tablet TAKE ONE TABLET BY MOUTH ONCE DAILY.   bisoprolol (ZEBETA) 10 MG tablet Take 1 tablet (10 mg total) by mouth daily. (Patient taking differently: Take 5 mg by mouth daily.)   Calcium Carbonate-Vitamin D (CALCIUM 600 + D PO) Take 1 tablet by mouth 2 (two) times daily.   Cholecalciferol (VITAMIN D) 2000 UNITS CAPS Take 2,000 Units by mouth at bedtime.   dofetilide (TIKOSYN) 125 MCG capsule TAKE (1) CAPSULE BY MOUTH TWICE DAILY. (Patient taking differently: Take 125 mcg by mouth 2 (two) times daily.)   Fluticasone-Umeclidin-Vilant (TRELEGY ELLIPTA) 100-62.5-25 MCG/ACT AEPB Inhale 1 puff into the lungs daily.   folic acid (FOLVITE) 1 MG tablet Take 1 mg by mouth 2 (two) times daily.   losartan (COZAAR) 100 MG tablet TAKE (1) TABLET BY MOUTH DAILY. (Patient taking differently: Take 100 mg by mouth daily.)   Polyethyl Glycol-Propyl Glycol (SYSTANE OP) Place 1 drop into both eyes daily as needed (Dry eyes).   potassium chloride SA (KLOR-CON M) 20 MEQ tablet TAKE (1) TABLET BY MOUTH 3 TIMES DAILY. (Patient taking differently: Take 20 mEq  by mouth 3 (three) times daily. Pt takes twice daily)   torsemide (DEMADEX) 20 MG tablet Take 4 tablets (80 mg total) by mouth daily.   triamcinolone cream (KENALOG) 0.1 % Apply 1 application. topically daily as needed (itching).                        Past Medical History:  Diagnosis Date   Anxiety    Chronic atrial fibrillation (HCC)    Chronic back pain    Chronic diastolic heart failure (  [place])    Chronic obstructive pulmonary disease, unspecified ([place])    Complete heart block (HCC)    COPD (chronic obstructive pulmonary disease) (HCC)    Coronary atherosclerosis of native coronary artery    Multivessel status post CABG, DES PLA March 2006   DDD (degenerative disc disease), lumbar    Essential hypertension    Headache    ICD (implantable cardioverter-defibrillator) in place    Mixed hyperlipidemia    Osteoarthritis    Ventricular fibrillation ([place]) 2003   a. seen on PPM interrogation a/w syncope       Objective:    07/02/2022       163   06/17/22 163 lb 6.4 oz (74.1 kg)  06/14/22 160 lb (72.6 kg)  05/22/22 160 lb (72.6 kg)     Vital signs reviewed  07/02/2022  - Note at rest 02 sats  97% on 3lpm    General appearance:    w/c bound elderly wf nad      HEENT : Oropharynx  clear      NECK :  without  apparent JVD/ palpable Nodes/TM    LUNGS: no acc muscle use,  Mildly kyphotic chest wall, decreased bs/ dullness both bases s crackles    CV:  RRR  no s3  2-3/6 SEM  s  increase in P2, and  4+ pitting R / 3+ L lower ext edema  ABD:  soft and nontender with nl inspiratory excursion in the supine position. No bruits or organomegaly appreciated   MS:  Nl gait/ ext warm without deformities Or obvious joint restrictions  calf tenderness, cyanosis or clubbing    SKIN: warm and dry with diffuse acute cellultic changes both LE R > L stocking pattern with mild tenderness to palpation.     NEURO:  alert, approp, nl sensorium with  no motor or cerebellar deficits  apparent.        CXR PA and Lateral:   07/02/2022 :    I personally reviewed images and impression is as follows:      CM R>L effusions no change      Assessment

## 2022-07-02 NOTE — Assessment & Plan Note (Addendum)
Venous doppler R leg 06/15/22 neg - 07/02/2022 rx Doxycycline 100 mg bid x 10 days/ elevation plus continue diurectic rx per cards    Each maintenance medication was reviewed in detail including emphasizing most importantly the difference between maintenance and prns and under what circumstances the prns are to be triggered using an action plan format where appropriate.  Total time for H and P, chart review, counseling, reviewing dpi, 02  device(s) and generating customized AVS unique to this office visit / same day charting  > 30 min for multiple  refractory respiratory  symptoms of uncertain etiology

## 2022-07-02 NOTE — Assessment & Plan Note (Addendum)
Onset was "with heart problems"  but improved p cabg / rehab  - never smoker - Echo A999333 mod diastolic dysfunction  - PFT's  @ wt 170  11/07/14  FEV1 1.14 (69 % ) ratio 0.68  p 8 % improvement from saba p nothing prior to study with DLCO  10.72 (53%)   and FV curve mildly concave    - desat with walking 04/23/2022  see ex hypoxemia - PFT's @ wt 160  05/22/22  FEV1 0.79 (57 % ) ratio 0.61  p 11 % improvement from saba p ? prior to study with DLCO  8.36 (50%)   and FV curve min concave   - CTa2/25/24 c/w R>L effusion / atx  - Echo 07/08/22 pending >>>  She does have a mild copd component but note her biggest problem is the pleural effusions and fluid retention in legs c/w chf.  Note that pleural effusion and copd have the same effect on insp muscles/mechanics (both shorten their length prior to inspiration making them weaker with less force reserve) so they are synergistic in causing sob.   Rec:  no change resp rx = trelegy 100 and prn saba F/u closely with cards for diuretic management.

## 2022-07-07 DIAGNOSIS — R0609 Other forms of dyspnea: Secondary | ICD-10-CM | POA: Diagnosis not present

## 2022-07-07 DIAGNOSIS — I10 Essential (primary) hypertension: Secondary | ICD-10-CM | POA: Diagnosis not present

## 2022-07-07 DIAGNOSIS — R0602 Shortness of breath: Secondary | ICD-10-CM | POA: Diagnosis not present

## 2022-07-08 ENCOUNTER — Ambulatory Visit (HOSPITAL_COMMUNITY): Payer: Medicare PPO | Attending: Cardiovascular Disease

## 2022-07-08 DIAGNOSIS — R0609 Other forms of dyspnea: Secondary | ICD-10-CM

## 2022-07-08 LAB — BASIC METABOLIC PANEL
BUN/Creatinine Ratio: 32 — ABNORMAL HIGH (ref 12–28)
BUN: 56 mg/dL — ABNORMAL HIGH (ref 8–27)
CO2: 24 mmol/L (ref 20–29)
Calcium: 9.6 mg/dL (ref 8.7–10.3)
Chloride: 101 mmol/L (ref 96–106)
Creatinine, Ser: 1.75 mg/dL — ABNORMAL HIGH (ref 0.57–1.00)
Glucose: 129 mg/dL — ABNORMAL HIGH (ref 70–99)
Potassium: 4.9 mmol/L (ref 3.5–5.2)
Sodium: 144 mmol/L (ref 134–144)
eGFR: 28 mL/min/{1.73_m2} — ABNORMAL LOW (ref >59)

## 2022-07-08 LAB — ECHOCARDIOGRAM COMPLETE: S' Lateral: 3 cm

## 2022-07-09 ENCOUNTER — Telehealth: Payer: Self-pay | Admitting: *Deleted

## 2022-07-09 MED ORDER — TORSEMIDE 20 MG PO TABS: ORAL_TABLET | ORAL | 11 refills | Status: DC

## 2022-07-09 NOTE — Telephone Encounter (Signed)
-----   Message from Satira Sark, MD sent at 07/09/2022  8:28 AM EDT ----- Thank you for checking.  Would recommend Demadex 80 mg alternating with 60 mg every other day. ----- Message ----- From: Levonne Hubert, LPN Sent: [id number]   8:25 AM EDT To: Asencion Noble, MD; Satira Sark, MD  Results given to pt. Pt states that the right leg is still swollen but the left is  better. Had echo done on yesterday. States that she had [place] of right leg 2 weeks ago.

## 2022-07-09 NOTE — Telephone Encounter (Signed)
Pt notified of medication change. Pt read back and repeated instructions.

## 2022-07-14 NOTE — Progress Notes (Signed)
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 Etiology  with prior pfts 2016 showing mild aiflow obst not better on saba.  Pt unsure of baseline wt   Reported sob only with severe exertion at pfts 2016 @ wt 170    History of Present Illness  04/23/2022  Pulmonary/ 1st office eval/ Tomoki Lucken / [facility] Office  Chief Complaint  Patient presents with   Consult    SOB- O2 desaturations.  Patient was 87% room air after walking from lobby to room but stated she did not want to be on O2   Dyspnea:  100 ft / can still push cart at [employer]/ mb is 75 ft slt down to it.  Cough: started one day prior to OV  / minimal mucoid not noct  Sleep: flat in bed one pillow ok  SABA use: once a day but very poor technique  02: does not use it  Rec Stop lopressor and start biosoprolol 10 mg daily to see what difference this makes Work on inhaler technique:  Only use your albuterol as a rescue medication  Also  Ok to try albuterol 15 min before an activity (on alternating days)  that you know would usually make you short of breath     - PFT's  05/22/22  FEV1 0.79 (57 % ) ratio 0.61  p 11 % improvement from saba p ? prior to study with DLCO  8.36 (50%)   and FV curve min concave     06/17/2022  f/u ov/[place] office/[place] re: chronic asthma/ ex desats  maint on Breo 100 daily   Chief Complaint  Patient presents with   Follow-up    06/14/2022 For breathing and leg swelling  Have been titrating O2 to keep sats above 90%  Breathing is worse   Dyspnea:  mostly housebound / sob to bathroom/ not checking sats with activity  Cough: congestion back of throat / no significant production  Sleeping: slept in recliner one night prior @ 30 degrees  SABA use: not really using  hfa  02: 24/7 at 3lpm  Covid status: vax max/ neve infected   Rec Make sure you check your oxygen saturation  AT  your highest level of activity (not after you stop)   to be sure it stays over 90%   Elevate legs when sleeping and and as much as you can during the day  Trelegy 100 one click each am x 2 weeks then return to clinic   If worse breathing or can't keep sats over 90% at rest on max 02 > go to [place]    07/02/2022  f/u ov/[place] office/[facility] Horseman re: bilateral effusions/ chronic 02 resp failure maint on trelegy 100   Chief Complaint  Patient presents with   Follow-up    Echo scheduled next week  Breathing about the same    Dyspnea:  on 02 3lpm room to room is all she can do   Cough: cough/ tickle / squeaky voice  Sleeping: sleeping in lift 30 degrees  SABA use: none 02: 3lpm  Rec Doxycycline 100 mg twice daily x 10 days for cellulitis         07/15/2022  f/u ov/[place] office/Waldine Zenz re: bilateral effusion mod chronic asthma  maint on Trelegy 100  Chief Complaint  Patient presents with   Follow-up    Echo results- would like to discuss   Dyspnea:  3lpm getting around the house better since prior ov  Cough: none  Sleeping: lft chair 30 degrees SABA use: none 02: 3lpm 24/7      No obvious day to day or daytime variability or assoc excess/ purulent sputum or mucus plugs or hemoptysis or cp or chest tightness, subjective wheeze or overt sinus or hb symptoms.   Sleeing as above  without nocturnal  or early am exacerbation  of respiratory  c/o's or need for noct saba. Also denies any obvious fluctuation of symptoms with weather or environmental changes or other aggravating or alleviating factors except as outlined above   No unusual exposure hx or h/o childhood pna/ asthma or knowledge of premature birth.  Current Allergies, Complete Past Medical History, Past Surgical History, Family History, and Social History were reviewed in [employer] record.  ROS  The following are not active complaints unless  bolded Hoarseness, sore throat, dysphagia, dental problems, itching, sneezing,  nasal congestion or discharge of excess mucus or purulent secretions, ear ache,   fever, chills, sweats, unintended wt loss or wt gain, classically pleuritic or exertional cp,  orthopnea pnd or arm/hand swelling  or leg swelling, presyncope, palpitations, abdominal pain, anorexia, nausea, vomiting, diarrhea  or change in bowel habits or change in bladder habits, change in stools or change in urine, dysuria, hematuria,  rash improved p doxy , arthralgias, visual complaints, headache, numbness, weakness or ataxia or problems with walking or coordination,  change in mood or  memory.        Current Meds  Medication Sig   acetaminophen (TYLENOL) 500 MG tablet Take 500 mg by mouth every morning.   albuterol (PROVENTIL HFA;VENTOLIN HFA) 108 (90 BASE) MCG/ACT inhaler Inhale 2 puffs into the lungs every 6 (six) hours as needed for wheezing or shortness of breath.   amLODipine (NORVASC) 2.5 MG tablet Take 1 tablet (2.5 mg total) by mouth daily.   amoxicillin-clavulanate (AUGMENTIN) 875-125 MG tablet Take 1 tablet by mouth every 12 (twelve) hours.   apixaban (ELIQUIS) 2.5 MG TABS tablet TAKE (1) TABLET BY MOUTH TWICE DAILY. (Patient taking differently: Take 2.5 mg by mouth 2 (two) times daily.)   atorvastatin (LIPITOR) 20 MG tablet TAKE ONE TABLET BY MOUTH ONCE DAILY.   bisoprolol (ZEBETA) 10 MG tablet Take 1 tablet (10 mg total) by mouth daily. (Patient taking differently: Take 5 mg by mouth daily.)   Calcium Carbonate-Vitamin D (CALCIUM 600 + D PO) Take 1 tablet by mouth 2 (two) times daily.   Cholecalciferol (VITAMIN D) 2000 UNITS CAPS Take 2,000 Units by mouth at bedtime.   dofetilide (TIKOSYN) 125 MCG capsule TAKE (1) CAPSULE BY MOUTH TWICE DAILY. (Patient taking differently: Take 125 mcg by mouth 2 (two) times daily.)   Fluticasone-Umeclidin-Vilant (TRELEGY ELLIPTA) 100-62.5-25 MCG/ACT AEPB Inhale 1 puff into the lungs daily.    folic acid (FOLVITE) 1 MG tablet Take 1 mg by mouth 2 (two) times daily.   losartan (COZAAR) 100 MG tablet TAKE (1) TABLET BY MOUTH DAILY. (Patient taking differently: Take 100 mg by mouth daily.)   Polyethyl Glycol-Propyl Glycol (SYSTANE OP) Place 1 drop into both eyes daily as needed (Dry eyes).   potassium chloride SA (KLOR-CON M) 20 MEQ tablet TAKE (1) TABLET BY MOUTH 3 TIMES DAILY. (Patient taking differently: Take 20 mEq by mouth 3 (three) times daily. Pt takes twice daily)  torsemide (DEMADEX) 20 MG tablet Take 80 mg ( 4 Tablets) alternating with 60 mg (3 Tablets) every other day   triamcinolone cream (KENALOG) 0.1 % Apply 1 application. topically daily as needed (itching).         Past Medical History:  Diagnosis Date   Anxiety    Chronic atrial fibrillation (HCC)    Chronic back pain    Chronic diastolic heart failure (HCC)    Chronic obstructive pulmonary disease, unspecified (HCC)    Complete heart block (HCC)    COPD (chronic obstructive pulmonary disease) (HCC)    Coronary atherosclerosis of native coronary artery    Multivessel status post CABG, DES PLA March 2006   DDD (degenerative disc disease), lumbar    Essential hypertension    Headache    ICD (implantable cardioverter-defibrillator) in place    Mixed hyperlipidemia    Osteoarthritis    Ventricular fibrillation (HCC) 2003   a. seen on PPM interrogation a/w syncope       Objective:    Wts  07/15/2022       164 07/02/2022       163   06/17/22 163 lb 6.4 oz (74.1 kg)  06/14/22 160 lb (72.6 kg)  05/22/22 160 lb (72.6 kg)    Vital signs reviewed  07/15/2022  - Note at rest 02 sats  97% on 3lpm cont   General appearance:    w/c bound somber wf nad      HEENT : Oropharynx  clear      NECK :  without  apparent JVD/ palpable Nodes/TM    LUNGS: no acc muscle use,  Min barrel/kyphotic  contour chest wall with bilateral decreased bs/ dullness both bases  and  without cough on insp or exp maneuvers and min   Hyperresonant  to  percussion bilaterally    CV:  RRRR  no s3  2-3/6 SEM   and  increase in P2, and Pos pitting both LE   ABD:  soft and nontender with pos end  insp Hoover's  in the supine position.  No bruits or organomegaly appreciated   MS:  ext warm without deformities Or obvious joint restrictions  calf tenderness, cyanosis or clubbing     SKIN: warm and dry with less acute   NEURO:  alert, approp, nl sensorium with  no motor or cerebellar deficits apparent.         I personally reviewed images and agree with radiology impression as follows:  CXR:   07/02/22  Bibasilar effusions and atelectasis slightly improved on RIGHT.  Bronchitic changes with mild atelectasis versus fluid at minor fissure      Assessment

## 2022-07-15 ENCOUNTER — Ambulatory Visit: Payer: Medicare PPO | Admitting: Internal Medicine

## 2022-07-15 ENCOUNTER — Encounter: Payer: Self-pay | Admitting: Internal Medicine

## 2022-07-15 VITALS — BP 138/82 | HR 84 | Ht 61.0 in | Wt 164.0 lb

## 2022-07-15 DIAGNOSIS — L03115 Cellulitis of right lower limb: Secondary | ICD-10-CM | POA: Diagnosis not present

## 2022-07-15 DIAGNOSIS — J9611 Chronic respiratory failure with hypoxia: Secondary | ICD-10-CM

## 2022-07-15 DIAGNOSIS — R0609 Other forms of dyspnea: Secondary | ICD-10-CM

## 2022-07-15 NOTE — Assessment & Plan Note (Signed)
Venous doppler R leg 06/15/22 neg - 07/02/2022 rx Doxycycline 100 mg bid x 10 days/ elevation plus continue diurectic rx per cards - 07/15/2022   cellulitis RLE improved, holding further abx   Rec elevation/ approp diuresis as planned

## 2022-07-15 NOTE — Assessment & Plan Note (Addendum)
Onset was "with heart problems"  but improved p cabg / rehab  - never smoker - Echo [id number] mod diastolic dysfunction  - PFT's  @ wt 170  11/07/14  FEV1 1.14 (69 % ) ratio 0.68  p 8 % improvement from saba p nothing prior to study with DLCO  10.72 (53%)   and FV curve mildly concave    - desat with walking 04/23/2022  see ex hypoxemia - PFT's @ wt 160  05/22/22  FEV1 0.79 (57 % ) ratio 0.61  p 11 % improvement from saba p ? prior to study with DLCO  8.36 (50%)   and FV curve min concave   - CTa2/25/24 c/w R>L effusion / atx  - Echo 07/09/22  1. Left ventricular ejection fraction, by estimation, is 50 to 55%. Left ventricular ejection fraction by 3D volume is 50 %. The left ventricle has low normal function.   2. Right ventricular systolic function is moderately reduced. The right ventricular size is moderately enlarged. There is moderately elevated pulmonary artery systolic pressure. The estimated right ventricular systolic pressure is 123456 mmHg.  3. Left atrial size was severely dilated.  4. Right atrial size was severely dilated.  5. The mitral valve is normal in structure. Mild to moderate mitral valve regurgitation.  6. Tricuspid valve regurgitation is moderate to severe.  7.  Aortic valve regurgitation is mild.    8. The inferior vena cava is dilated in size with <50% respiratory variability, suggesting right atrial pressure of 15 mmHg.     Lab Results  Component Value Date   CREATININE 1.75 (H) 07/07/2022   CREATININE 1.38 (H) 06/14/2022   CREATININE 1.32 (H) 04/23/2022     It looks like the diuretics have done all they can here an there is risk of over diuresing at this point so referred back to Dr Domenic Polite to see if there are any other options with f/u cxr at time ov ov.  All that can be done from pulmonary perspective is maint on Trelegy and approp saba/ 02   ? If R ht cath would help out at this point (doubt but has f/u with Dr Domenic Polite planned and suggested she move up if feeling  she's losing ground).

## 2022-07-15 NOTE — Assessment & Plan Note (Signed)
Well compensated on 3lpm s evidence  of hypercarbia > no change rx  Advised:  Make sure you check your oxygen saturation  AT  your highest level of activity (not after you stop)   to be sure it stays over 90% and adjust  02 flow upward to maintain this level if needed but remember to turn it back to previous settings when you stop (to conserve your supply). ting worse, and let me know if losing ground. (Collect the dots to connect the dots approach)           Each maintenance medication was reviewed in detail including emphasizing most importantly the difference between maintenance and prns and under what circumstances the prns are to be triggered using an action plan format where appropriate.  Total time for H and P, chart review, counseling, reviewing hfa/dpi/02 device(s) and generating customized AVS unique to this office visit / same day charting > 30 min for multiple  refractory respiratory  problems

## 2022-07-15 NOTE — Progress Notes (Deleted)
Cardiology Office Note  Date: 07/15/2022   ID: [name], [name] 11/07/1931, MRN [id number]  History of Present Illness: [name] [name] is an 89 y.o. female last seen in January.  She was added on to the schedule today for further discussion of her recent echocardiogram findings.  She has been following with Dr. Melvyn Novas in the interim who ordered the echocardiogram.  Physical Exam: VS:  There were no vitals taken for this visit., BMI There is no height or weight on file to calculate BMI.  Wt Readings from Last 3 Encounters:  07/15/22 164 lb (74.4 kg)  07/02/22 163 lb 9.6 oz (74.2 kg)  06/17/22 163 lb 6.4 oz (74.1 kg)    General: Patient appears comfortable at rest. HEENT: Conjunctiva and lids normal, oropharynx clear with moist mucosa. Neck: Supple, no elevated JVP or carotid bruits, no thyromegaly. Lungs: Clear to auscultation, nonlabored breathing at rest. Cardiac: Regular rate and rhythm, no S3 or significant systolic murmur, no pericardial rub. Abdomen: Soft, nontender, no hepatomegaly, bowel sounds present, no guarding or rebound. Extremities: No pitting edema, distal pulses 2+. Skin: Warm and dry. Musculoskeletal: No kyphosis. Neuropsychiatric: Alert and oriented x3, affect grossly appropriate.  ECG:  An ECG dated 06/14/2022 was personally reviewed today and demonstrated:  Dual-chamber paced rhythm.  Labwork: November 2023: Cholesterol 128, triglycerides 163, HDL 42, LDL 58 04/23/2022: TSH 2.810 06/14/2022: ALT 17; AST 24; B Natriuretic Peptide 536.0; Hemoglobin 11.7; Magnesium 2.5; Platelets 204 07/07/2022: BUN 56; Creatinine, Ser 1.75; Potassium 4.9; Sodium 144   Other Studies Reviewed Today:  Echocardiogram 07/08/2022:  1. Left ventricular ejection fraction, by estimation, is 50 to 55%. Left  ventricular ejection fraction by 3D volume is 50 %. The left ventricle has  low normal function. The left ventricle has no regional wall motion  abnormalities. Left ventricular   diastolic function could not be evaluated.   2. Right ventricular systolic function is moderately reduced. The right  ventricular size is moderately enlarged. There is moderately elevated  pulmonary artery systolic pressure. The estimated right ventricular  systolic pressure is 123456 mmHg.   3. Left atrial size was severely dilated.   4. Right atrial size was severely dilated.   5. The mitral valve is normal in structure. Mild to moderate mitral valve  regurgitation.   6. Tricuspid valve regurgitation is moderate to severe.   7. The aortic valve is tricuspid. There is mild calcification of the  aortic valve. There is mild thickening of the aortic valve. Aortic valve  regurgitation is mild. Aortic valve sclerosis is present, with no evidence  of aortic valve stenosis.   8. The inferior vena cava is dilated in size with <50% respiratory  variability, suggesting right atrial pressure of 15 mmHg.   Assessment and Plan:  1.  Longstanding dyspnea on exertion, likely multifactorial.  She has chronic hypoxic respiratory failure on supplemental oxygen and following with Dr. Melvyn Novas, HFpEF with LVEF most recently 84 to 55% and further evidence of moderate RV dysfunction with moderate to severe pulmonary hypertension as well as moderate to severe tricuspid regurgitation (worse compared to prior echocardiogram from July 2023), multivessel CAD status post CABG as well as DES to the LAD, and paroxysmal atrial fibrillation managed with Tikosyn for rhythm suppression and with fortunately fairly low rhythm burden based on device interrogation.  Dr. Melvyn Novas mentioned the possibility of considering a right heart catheterization at his most recent encounter with the patient.  2.  Paroxysmal atrial fibrillation with CHA2DS2-VASc score  of 5.  She is on Tikosyn for rhythm suppression, also bisoprolol and Eliquis.  3.  Multivessel CAD status post CABG with LIMA to LAD, SVG to diagonal, and SVG to ramus as well as OM.   Also status post DES to the PLA in March 2006.  Lexiscan Myoview in June 2021 was low risk with no active ischemic territories.  4.  Complete heart block as well as prior history of VF, [employer] ICD in place with follow-up by Dr. Lovena Le.  5.  Essential hypertension.  6.  Mixed hyperlipidemia.  Disposition:  Follow up {follow up:15908}  Signed, Satira Sark, M.D., F.A.C.C. [place] [facility]

## 2022-07-15 NOTE — Patient Instructions (Signed)
No change in recommendations   Be sure to adjust 02 if needed to keep sats > 90%   Will need to get cxr before next visit with cardiology   Discuss with Dr Domenic Polite possibility of right heart Cath to determine pressures on R vs Left side of heart that are likely the cause of your pleural fluid.  Please schedule a follow up office visit in 6 weeks, call sooner if needed

## 2022-07-17 ENCOUNTER — Telehealth: Payer: Self-pay | Admitting: Cardiology

## 2022-07-17 NOTE — Telephone Encounter (Signed)
Pt's son had to call and cancel appt. Set for 4/1 due to him having jury duty. Appt was rescheduled for 4/24 with PA, Strader but so would like a callback from nurse regarding something sooner. Please advise

## 2022-07-20 ENCOUNTER — Other Ambulatory Visit: Payer: Self-pay

## 2022-07-20 ENCOUNTER — Telehealth: Payer: Self-pay | Admitting: Internal Medicine

## 2022-07-20 ENCOUNTER — Ambulatory Visit: Payer: Medicare PPO | Admitting: Cardiology

## 2022-07-20 DIAGNOSIS — I272 Pulmonary hypertension, unspecified: Secondary | ICD-10-CM

## 2022-07-20 MED ORDER — TRELEGY ELLIPTA 100-62.5-25 MCG/ACT IN AEPB: 1.0000 | INHALATION_SPRAY | Freq: Every day | RESPIRATORY_TRACT | 6 refills | Status: DC

## 2022-07-20 NOTE — Telephone Encounter (Signed)
Refills of Trelegy has been sent to patients pharmacy. She is aware. NFN

## 2022-07-20 NOTE — Telephone Encounter (Signed)
Trelegy has worked well for patient and now would like a script sent in. Dr. Melvyn Novas please advise if this is okay?

## 2022-07-20 NOTE — Telephone Encounter (Signed)
Caroliana Apotocary Linna Hoff is Pharm  Son calling for mom.Wert gave her a sample of Trelegy 100  and she liked it very much. Can we call it in?  Mason (Son)

## 2022-07-22 NOTE — Telephone Encounter (Signed)
c 

## 2022-07-23 ENCOUNTER — Ambulatory Visit: Payer: Medicare PPO | Admitting: Orthopedic Surgery

## 2022-08-07 ENCOUNTER — Ambulatory Visit (INDEPENDENT_AMBULATORY_CARE_PROVIDER_SITE_OTHER): Payer: Medicare PPO

## 2022-08-07 DIAGNOSIS — R0602 Shortness of breath: Secondary | ICD-10-CM | POA: Diagnosis not present

## 2022-08-07 DIAGNOSIS — R0609 Other forms of dyspnea: Secondary | ICD-10-CM | POA: Diagnosis not present

## 2022-08-07 DIAGNOSIS — I442 Atrioventricular block, complete: Secondary | ICD-10-CM

## 2022-08-10 ENCOUNTER — Ambulatory Visit (HOSPITAL_COMMUNITY)
Admission: RE | Admit: 2022-08-10 | Discharge: 2022-08-10 | Disposition: A | Discharge status: Home / Self Care | Payer: Medicare PPO | Source: Ambulatory Visit | Attending: Internal Medicine | Admitting: Internal Medicine

## 2022-08-10 DIAGNOSIS — J811 Chronic pulmonary edema: Secondary | ICD-10-CM | POA: Diagnosis not present

## 2022-08-10 DIAGNOSIS — R0609 Other forms of dyspnea: Secondary | ICD-10-CM | POA: Insufficient documentation

## 2022-08-10 DIAGNOSIS — J9811 Atelectasis: Secondary | ICD-10-CM | POA: Diagnosis not present

## 2022-08-10 DIAGNOSIS — J90 Pleural effusion, not elsewhere classified: Secondary | ICD-10-CM | POA: Diagnosis not present

## 2022-08-10 LAB — CUP PACEART REMOTE DEVICE CHECK
Battery Remaining Longevity: 70 mo
Battery Voltage: 2.93 V
Brady Statistic AP VP Percent: 73.39 %
Brady Statistic AP VS Percent: 0.01 %
Brady Statistic AS VP Percent: 26.59 %
Brady Statistic AS VS Percent: 0.02 %
Brady Statistic RA Percent Paced: 71.85 %
Brady Statistic RV Percent Paced: 99.87 %
Date Time Interrogation Session: 20240422115820
HighPow Impedance: 51 Ohm
HighPow Impedance: 70 Ohm
Implantable Lead Connection Status: 753985
Implantable Lead Connection Status: 753985
Implantable Lead Implant Date: 20030823
Implantable Lead Implant Date: 20030823
Implantable Lead Location: 753859
Implantable Lead Location: 753860
Implantable Lead Model: 157
Implantable Lead Model: 4086
Implantable Lead Serial Number: 108215
Implantable Lead Serial Number: 112190
Implantable Pulse Generator Implant Date: 20230419
Lead Channel Impedance Value: 323 Ohm
Lead Channel Impedance Value: 323 Ohm
Lead Channel Impedance Value: 399 Ohm
Lead Channel Pacing Threshold Amplitude: 1.375 V
Lead Channel Pacing Threshold Pulse Width: 0.4 ms
Lead Channel Sensing Intrinsic Amplitude: 1.125 mV
Lead Channel Sensing Intrinsic Amplitude: 1.125 mV
Lead Channel Sensing Intrinsic Amplitude: 11.75 mV
Lead Channel Sensing Intrinsic Amplitude: 11.75 mV
Lead Channel Setting Pacing Amplitude: 2 V
Lead Channel Setting Pacing Amplitude: 2.75 V
Lead Channel Setting Pacing Pulse Width: 0.4 ms
Lead Channel Setting Sensing Sensitivity: 0.3 mV
Zone Setting Status: 755011

## 2022-08-12 ENCOUNTER — Other Ambulatory Visit
Admission: RE | Admit: 2022-08-12 | Discharge: 2022-08-12 | Disposition: A | Discharge status: Home / Self Care | Payer: Medicare PPO | Source: Ambulatory Visit | Attending: Student | Admitting: Student

## 2022-08-12 ENCOUNTER — Encounter: Payer: Self-pay | Admitting: Student

## 2022-08-12 ENCOUNTER — Ambulatory Visit: Payer: Medicare PPO | Admitting: Student

## 2022-08-12 VITALS — BP 124/60 | HR 74 | Ht 61.0 in | Wt 167.0 lb

## 2022-08-12 DIAGNOSIS — I272 Pulmonary hypertension, unspecified: Secondary | ICD-10-CM | POA: Diagnosis not present

## 2022-08-12 DIAGNOSIS — I10 Essential (primary) hypertension: Secondary | ICD-10-CM | POA: Diagnosis not present

## 2022-08-12 DIAGNOSIS — I270 Primary pulmonary hypertension: Secondary | ICD-10-CM | POA: Diagnosis present

## 2022-08-12 DIAGNOSIS — I2510 Atherosclerotic heart disease of native coronary artery without angina pectoris: Secondary | ICD-10-CM | POA: Diagnosis not present

## 2022-08-12 DIAGNOSIS — I480 Paroxysmal atrial fibrillation: Secondary | ICD-10-CM | POA: Diagnosis not present

## 2022-08-12 DIAGNOSIS — Z95810 Presence of automatic (implantable) cardiac defibrillator: Secondary | ICD-10-CM

## 2022-08-12 DIAGNOSIS — I5031 Acute diastolic (congestive) heart failure: Secondary | ICD-10-CM

## 2022-08-12 DIAGNOSIS — N1832 Chronic kidney disease, stage 3b: Secondary | ICD-10-CM

## 2022-08-12 DIAGNOSIS — E785 Hyperlipidemia, unspecified: Secondary | ICD-10-CM

## 2022-08-12 DIAGNOSIS — Z79899 Other long term (current) drug therapy: Secondary | ICD-10-CM

## 2022-08-12 LAB — BASIC METABOLIC PANEL
Anion gap: 10 (ref 5–15)
BUN: 40 mg/dL — ABNORMAL HIGH (ref 8–23)
CO2: 32 mmol/L (ref 22–32)
Calcium: 9.8 mg/dL (ref 8.9–10.3)
Chloride: 100 mmol/L (ref 98–111)
Creatinine, Ser: 1.43 mg/dL — ABNORMAL HIGH (ref 0.44–1.00)
GFR, Estimated: 35 mL/min — ABNORMAL LOW (ref >60)
Glucose, Bld: 108 mg/dL — ABNORMAL HIGH (ref 70–99)
Potassium: 4.8 mmol/L (ref 3.5–5.1)
Sodium: 142 mmol/L (ref 135–145)

## 2022-08-12 LAB — BRAIN NATRIURETIC PEPTIDE: B Natriuretic Peptide: 786 pg/mL — ABNORMAL HIGH (ref 0.0–100.0)

## 2022-08-12 NOTE — Patient Instructions (Signed)
Medication Instructions:  Your physician recommends that you continue on your current medications as directed. Please refer to the Current Medication list given to you today.  *If you need a refill on your cardiac medications before your next appointment, please call your pharmacy*   Lab Work: Your physician recommends that you return for lab work in: Today   If you have labs (blood work) drawn today and your tests are completely normal, you will receive your results only by: MyChart Message (if you have MyChart) OR A paper copy in the mail If you have any lab test that is abnormal or we need to change your treatment, we will call you to review the results.   Testing/Procedures: NONE    Follow-Up: At [facility], you and your health needs are our priority.  As part of our continuing mission to provide you with exceptional heart care, we have created designated Provider Care Teams.  These Care Teams include your primary Cardiologist (physician) and Advanced Practice Providers (APPs -  Physician Assistants and Nurse Practitioners) who all work together to provide you with the care you need, when you need it.  We recommend signing up for the patient portal called "MyChart".  Sign up information is provided on this After Visit Summary.  MyChart is used to connect with patients for Virtual Visits (Telemedicine).  Patients are able to view lab/test results, encounter notes, upcoming appointments, etc.  Non-urgent messages can be sent to your provider as well.   To learn more about what you can do with MyChart, go to [email or web address].    Your next appointment:   1 -2 week(s)  Provider:   You may see Nona Dell, MD or one of the following Advanced Practice Providers on your designated Care Team:   Randall An, PA-C  Jacolyn Reedy, PA-C     Other Instructions Thank you for choosing [facility]!

## 2022-08-12 NOTE — Progress Notes (Signed)
Cardiology Office Note    Date:  08/12/2022  ID:  [name] [name] Feb 19, 1932, MRN [id number] Cardiologist: Nona Dell, MD   EP: Dr. Ladona Ridgel  History of Present Illness:    [name] [name] is a 89 y.o. female with past medical history of CAD (s/p CABG in 1998 with LIMA-LAD, SVG-Diagonal, SVG-RI-OM, DES to PLA in 2006, low risk NST in 09/2019), HFpEF, chronic hypoxic respiratory failure, chronic obstructive asthma, paroxysmal atrial fibrillation, history of VT (s/p ICD placement), HTN, HLD and Stage 3 CKD who presents to the office today for follow-up of her recent echocardiogram.  She was examined by Dr. Diona Browner in 04/2022 and was being followed by Pulmonology with recent recommendations to start ambulatory oxygen supplementation. She denied any recent angina. She was continued on her current cardiac medications including Eliquis 2.5 mg twice daily, Amlodipine 5 mg daily, Atorvastatin 20 mg daily, Bisoprolol 10 mg daily, Tikosyn 125 mcg twice daily, Losartan 100 mg daily and Torsemide 60 mg daily.  In the interim, the patient's family did call the office reporting worsening lower extremity edema and Amlodipine was reduced to 2.5 mg daily with instructions to take an extra Torsemide tablet as needed. Pulmonology did recommend a repeat echocardiogram in the interim and this showed a low-normal EF of 50 to 55% with no regional wall motion abnormalities. RV function was moderately reduced and PASP was moderately elevated at 59.1 mmHg. She did have severe biatrial dilation, mild to moderate MR and moderate to severe TR. The report mentioned it was going to be reviewed with Dr. Diona Browner in regards to a possible RHC. She did have prior PFT's in 05/2022 which showed severe obstructive airway disease and severe diffusion defect.  In talking with the patient and her son today, she reports a significant decline in her respiratory status and energy level over the past 3-4 months. Using supplemental oxygen  per Pulmonology. Has noticed her lower extremity edema continues to progress despite titration of diuretic therapy. She does elevate her lower extremities. No recent exertional chest pain or palpitations.   Studies Reviewed:   EKG: EKG is not ordered today.   Echocardiogram: 06/2022 IMPRESSIONS     1. Left ventricular ejection fraction, by estimation, is 50 to 55%. Left  ventricular ejection fraction by 3D volume is 50 %. The left ventricle has  low normal function. The left ventricle has no regional wall motion  abnormalities. Left ventricular  diastolic function could not be evaluated.   2. Right ventricular systolic function is moderately reduced. The right  ventricular size is moderately enlarged. There is moderately elevated  pulmonary artery systolic pressure. The estimated right ventricular  systolic pressure is 59.1 mmHg.   3. Left atrial size was severely dilated.   4. Right atrial size was severely dilated.   5. The mitral valve is normal in structure. Mild to moderate mitral valve  regurgitation.   6. Tricuspid valve regurgitation is moderate to severe.   7. The aortic valve is tricuspid. There is mild calcification of the  aortic valve. There is mild thickening of the aortic valve. Aortic valve  regurgitation is mild. Aortic valve sclerosis is present, with no evidence  of aortic valve stenosis.   8. The inferior vena cava is dilated in size with <50% respiratory  variability, suggesting right atrial pressure of 15 mmHg.   Comparison(s): Prior images reviewed side by side. Tricuspid regurgitation  is much worse and right ventricular dilation and systolic dysfunction is  now present.  Physical Exam:   VS:  BP 124/60   Pulse 74   Ht  (1.549 m)   Wt 167 lb (75.8 kg)   SpO2 98%   BMI 31.55 kg/m    Wt Readings from Last 3 Encounters:  08/12/22 167 lb (75.8 kg)  07/15/22 164 lb (74.4 kg)  07/02/22 163 lb 9.6 oz (74.2 kg)     GEN: Elderly female  appearing in no acute distress NECK: JVD elevated to jaw line. ; No carotid bruits CARDIAC: RRR, 2/6 systolic murmur along Apex.  RESPIRATORY: Decreased breath sounds along bases bilaterally.  ABDOMEN: Appears non-distended. No obvious abdominal masses. EXTREMITIES: No clubbing or cyanosis. 2+ pitting edema bilaterally.  Distal pedal pulses are 2+ bilaterally.   Assessment and Plan:   1. Acute HFpEF/Pulmonary HTN - Recent echo showed her EF was mildly reduced at 50-55% with RV function being moderately reduced and PASP was moderately elevated at 59.1 mmHg. Reviewed with Dr. Diona Browner and he recommended referral to the Advanced Heart Failure clinic with consideration of a RHC if she wished to pursue aggressive therapy. Discussed with the patient and her son today and she is interested in exploring options. Suspect her Pulmonary HTN is mixed Group 2/3 as recent PFT's showed severe obstructive airway disease and severe diffusion defect. - Given her weight gain of over 11 lbs since 04/2022 and volume overload by examination, will recheck a BNP and BMET today. Pending reassessment of renal function, would consider using Metolazone. Continue Torsemide  daily alternating with  daily. If she fails outpatient diuresis, we reviewed she would likely need to be admitted for IV diuresis given her volume overload. Can consider adding an SGLT2 inhibitor at follow-up pending repeat labs as well.   2. CAD - She is s/p CABG in 1998 with LIMA-LAD, SVG-Diagonal, SVG-RI-OM with DES to PLA in 2006 and most recent ischemic evaluation was a low-risk NST in 09/2019. - She reports dyspnea on exertion but no recent chest pain.  - Continue Atorvastatin  daily and Bisoprolol  daily. Not on ASA given the need for anticoagulation.   3. Paroxysmal Atrial Fibrillation - Recent device interrogation showed brief episodes of atrial flutter with the longest lasting for 10 minutes. She remains on Eliquis 2.5 mg twice  daily for anticoagulation which is the appropriate dose given her age of 27 and creatinine at 1.75 by most recent labs. Recheck BMET today.  - She remains on Tikosyn 125 mcg twice daily which is followed by EP and also on Bisoprolol 5 mg daily.  4. History of VT - Her device is followed by Dr. Ladona Ridgel and most recent device interrogation earlier this month showed 8 VHR detections consistent with NSVT and VF of short duration but no therapies were administered.  5. HTN - BP is well-controlled at 124/60 during today's visit. Continue current medical therapy.   6. HLD - Followed by her PCP. LDL was at 58 in 02/2022. Continue Atorvastatin  daily.   7. Stage 3 CKD - Baseline creatinine 1.3 - 1.4. Was at 1.75 on 07/07/2022. Will recheck a BMET today.    Signed, Ellsworth Lennox, PA-C

## 2022-08-13 ENCOUNTER — Telehealth: Payer: Self-pay | Admitting: *Deleted

## 2022-08-13 ENCOUNTER — Other Ambulatory Visit (HOSPITAL_COMMUNITY): Payer: Self-pay

## 2022-08-13 ENCOUNTER — Telehealth: Payer: Self-pay

## 2022-08-13 DIAGNOSIS — Z79899 Other long term (current) drug therapy: Secondary | ICD-10-CM

## 2022-08-13 MED ORDER — METOLAZONE 2.5 MG PO TABS: ORAL_TABLET | ORAL | 0 refills | Status: DC

## 2022-08-13 MED ORDER — METOLAZONE 2.5 MG PO TABS
ORAL_TABLET | ORAL | 0 refills | Status: DC
  Filled 2022-08-13 (×2): qty 30, 30d supply, fill #0

## 2022-08-13 NOTE — Telephone Encounter (Signed)
Pharmacy Patient Advocate Encounter   Pharmacy team collaborated with prescriber to confirm that Metolazone 2.5 MG is appropriate for patient for 3 doses and STOP.   Prior Authorization for Metolazone 2.5 MG has been approved.    Please be mindful approval is for 30 tabs, but Apothe[facility]ary should run the script as qty 3 with day supply 30. This way we won't send the patient home with more drug than she needs.   Please let me know if you need anything additionally.   Haze Rushing, CPhT Pharmacy Patient Advocate Specialist Direct Number: [phone] Fax: [phone]

## 2022-08-13 NOTE — Telephone Encounter (Signed)
-----   Message from Ellsworth Lennox, [place] sent at 08/12/2022  7:24 PM EDT ----- Please let the patient know her fluid level remains elevated with BNP at 786. Electrolytes are within a normal range and her kidney function has actually improved with creatinine going from 1.75 down to 1.43. As discussed in her office visit today, would recommend using Metolazone 2.5mg  ever other day for 3 doses (can provide 10 pills). Take this 30 minutes prior to taking Torsemide and take an extra K-dur on the days of taking Metolazone. Call with weight and symptoms early next week. Pending reassessment, will either need repeat labs and can continue or if no improvement, she may require hospitalization for IV diuresis as we reviewed today.

## 2022-08-13 NOTE — Telephone Encounter (Addendum)
Spoke with pt and son. Notified of current med changes and the need for labs. Both voiced understanding.

## 2022-08-13 NOTE — Telephone Encounter (Signed)
Patient is requesting call back from Kimberly, LPN again.

## 2022-08-13 NOTE — Telephone Encounter (Signed)
   Discussed with Dr. Diona Browner and given only a short-course of Metolazone, would use and obtain close follow-up labs. Our Pharmacy team in [place] did complete the PA for this. Would update Rx for only 3 tablets and as discussed in the result note, she should take an extra K-dur on the days she takes Metolazone. Repeat BMET and Mg on Monday. If no improvement in fluid status, may still require admission for diuresis.   Signed, Ellsworth Lennox, PA-C 08/13/2022, 2:17 PM

## 2022-08-13 NOTE — Telephone Encounter (Signed)
Pharmacy Patient Advocate Encounter   Received notification from [facility] (see full fax on chart media) that prior authorization for METOLAZONE 2.5 TAB is needed.  Per test claim patient just got 180 caps of DOFETILIDE 125 MCG on 07/28/22  Rejection is coming back for a DUR for Drug-drug interaction. Is staff aware patient is prescribed both?  PLEASE ADVISE  Haze Rushing, CPhT Pharmacy Patient Advocate Specialist Direct Number: [phone] Fax: [phone]

## 2022-08-13 NOTE — Addendum Note (Signed)
Addended by: Kerney Elbe on: 08/13/2022 03:25 PM   Modules accepted: Orders

## 2022-08-13 NOTE — Telephone Encounter (Signed)
Patient notified and voiced understanding.

## 2022-08-13 NOTE — Telephone Encounter (Signed)
Pt and son notified of medication changes and the need for labs.

## 2022-08-13 NOTE — Telephone Encounter (Signed)
Do not recommend addition of metolazone due to patient on Tikosyn.  Concurrent use of DOFETILIDE and DIURETICS may result in an increased risk of cardiotoxicity (QT prolongation, torsades de pointes, cardiac arrest)  Recommend IV diuresis so patient electrolytes and EKG can be monitored

## 2022-08-17 ENCOUNTER — Other Ambulatory Visit (HOSPITAL_COMMUNITY)
Admission: RE | Admit: 2022-08-17 | Discharge: 2022-08-17 | Disposition: A | Discharge status: Home / Self Care | Payer: Medicare PPO | Source: Ambulatory Visit | Attending: Student | Admitting: Student

## 2022-08-17 DIAGNOSIS — J90 Pleural effusion, not elsewhere classified: Secondary | ICD-10-CM | POA: Diagnosis not present

## 2022-08-17 DIAGNOSIS — E785 Hyperlipidemia, unspecified: Secondary | ICD-10-CM | POA: Diagnosis not present

## 2022-08-17 DIAGNOSIS — I482 Chronic atrial fibrillation, unspecified: Secondary | ICD-10-CM | POA: Diagnosis not present

## 2022-08-17 DIAGNOSIS — I110 Hypertensive heart disease with heart failure: Secondary | ICD-10-CM | POA: Diagnosis not present

## 2022-08-17 DIAGNOSIS — J9611 Chronic respiratory failure with hypoxia: Secondary | ICD-10-CM | POA: Diagnosis present

## 2022-08-17 DIAGNOSIS — R0602 Shortness of breath: Secondary | ICD-10-CM | POA: Diagnosis not present

## 2022-08-17 DIAGNOSIS — I130 Hypertensive heart and chronic kidney disease with heart failure and stage 1 through stage 4 chronic kidney disease, or unspecified chronic kidney disease: Secondary | ICD-10-CM | POA: Diagnosis not present

## 2022-08-17 DIAGNOSIS — I340 Nonrheumatic mitral (valve) insufficiency: Secondary | ICD-10-CM | POA: Diagnosis present

## 2022-08-17 DIAGNOSIS — J449 Chronic obstructive pulmonary disease, unspecified: Secondary | ICD-10-CM | POA: Diagnosis not present

## 2022-08-17 DIAGNOSIS — R262 Difficulty in walking, not elsewhere classified: Secondary | ICD-10-CM | POA: Diagnosis not present

## 2022-08-17 DIAGNOSIS — I5032 Chronic diastolic (congestive) heart failure: Secondary | ICD-10-CM | POA: Diagnosis not present

## 2022-08-17 DIAGNOSIS — Z95810 Presence of automatic (implantable) cardiac defibrillator: Secondary | ICD-10-CM | POA: Diagnosis not present

## 2022-08-17 DIAGNOSIS — I5033 Acute on chronic diastolic (congestive) heart failure: Secondary | ICD-10-CM | POA: Diagnosis not present

## 2022-08-17 DIAGNOSIS — N1832 Chronic kidney disease, stage 3b: Secondary | ICD-10-CM | POA: Diagnosis present

## 2022-08-17 DIAGNOSIS — J209 Acute bronchitis, unspecified: Secondary | ICD-10-CM | POA: Diagnosis not present

## 2022-08-17 DIAGNOSIS — I472 Ventricular tachycardia, unspecified: Secondary | ICD-10-CM | POA: Diagnosis present

## 2022-08-17 DIAGNOSIS — E782 Mixed hyperlipidemia: Secondary | ICD-10-CM | POA: Diagnosis present

## 2022-08-17 DIAGNOSIS — I480 Paroxysmal atrial fibrillation: Secondary | ICD-10-CM | POA: Diagnosis present

## 2022-08-17 DIAGNOSIS — J4489 Other specified chronic obstructive pulmonary disease: Secondary | ICD-10-CM | POA: Diagnosis present

## 2022-08-17 DIAGNOSIS — Z8249 Family history of ischemic heart disease and other diseases of the circulatory system: Secondary | ICD-10-CM | POA: Diagnosis not present

## 2022-08-17 DIAGNOSIS — I5043 Acute on chronic combined systolic (congestive) and diastolic (congestive) heart failure: Secondary | ICD-10-CM | POA: Diagnosis present

## 2022-08-17 DIAGNOSIS — R2681 Unsteadiness on feet: Secondary | ICD-10-CM | POA: Diagnosis present

## 2022-08-17 DIAGNOSIS — I2510 Atherosclerotic heart disease of native coronary artery without angina pectoris: Secondary | ICD-10-CM | POA: Diagnosis present

## 2022-08-17 DIAGNOSIS — I272 Pulmonary hypertension, unspecified: Secondary | ICD-10-CM | POA: Diagnosis present

## 2022-08-17 DIAGNOSIS — Z79899 Other long term (current) drug therapy: Secondary | ICD-10-CM | POA: Diagnosis not present

## 2022-08-17 DIAGNOSIS — J9610 Chronic respiratory failure, unspecified whether with hypoxia or hypercapnia: Secondary | ICD-10-CM | POA: Diagnosis not present

## 2022-08-17 DIAGNOSIS — Z885 Allergy status to narcotic agent status: Secondary | ICD-10-CM | POA: Diagnosis not present

## 2022-08-17 DIAGNOSIS — Z951 Presence of aortocoronary bypass graft: Secondary | ICD-10-CM | POA: Diagnosis not present

## 2022-08-17 DIAGNOSIS — M6281 Muscle weakness (generalized): Secondary | ICD-10-CM | POA: Diagnosis present

## 2022-08-17 DIAGNOSIS — Z7901 Long term (current) use of anticoagulants: Secondary | ICD-10-CM | POA: Diagnosis not present

## 2022-08-17 DIAGNOSIS — Z881 Allergy status to other antibiotic agents status: Secondary | ICD-10-CM | POA: Diagnosis not present

## 2022-08-17 DIAGNOSIS — I10 Essential (primary) hypertension: Secondary | ICD-10-CM | POA: Diagnosis not present

## 2022-08-17 DIAGNOSIS — J9811 Atelectasis: Secondary | ICD-10-CM | POA: Diagnosis present

## 2022-08-17 DIAGNOSIS — Z7951 Long term (current) use of inhaled steroids: Secondary | ICD-10-CM | POA: Diagnosis not present

## 2022-08-17 LAB — BASIC METABOLIC PANEL
Anion gap: 10 (ref 5–15)
BUN: 50 mg/dL — ABNORMAL HIGH (ref 8–23)
CO2: 33 mmol/L — ABNORMAL HIGH (ref 22–32)
Calcium: 9.2 mg/dL (ref 8.9–10.3)
Chloride: 96 mmol/L — ABNORMAL LOW (ref 98–111)
Creatinine, Ser: 1.66 mg/dL — ABNORMAL HIGH (ref 0.44–1.00)
GFR, Estimated: 29 mL/min — ABNORMAL LOW (ref >60)
Glucose, Bld: 104 mg/dL — ABNORMAL HIGH (ref 70–99)
Potassium: 4.5 mmol/L (ref 3.5–5.1)
Sodium: 139 mmol/L (ref 135–145)

## 2022-08-17 LAB — MAGNESIUM: Magnesium: 2.3 mg/dL (ref 1.7–2.4)

## 2022-08-18 ENCOUNTER — Telehealth: Payer: Self-pay | Admitting: Student

## 2022-08-18 ENCOUNTER — Other Ambulatory Visit: Payer: Self-pay

## 2022-08-18 ENCOUNTER — Emergency Department (HOSPITAL_COMMUNITY): Payer: Medicare PPO

## 2022-08-18 ENCOUNTER — Inpatient Hospital Stay (HOSPITAL_COMMUNITY)
Admission: EM | Admit: 2022-08-18 | Discharge: 2022-08-23 | DRG: 291 | Disposition: A | Discharge status: Skilled Nursing Facility | Payer: Medicare PPO | Attending: Family Medicine | Admitting: Family Medicine

## 2022-08-18 ENCOUNTER — Encounter (HOSPITAL_COMMUNITY): Payer: Self-pay | Admitting: *Deleted

## 2022-08-18 DIAGNOSIS — Z7901 Long term (current) use of anticoagulants: Secondary | ICD-10-CM

## 2022-08-18 DIAGNOSIS — I5033 Acute on chronic diastolic (congestive) heart failure: Secondary | ICD-10-CM | POA: Diagnosis present

## 2022-08-18 DIAGNOSIS — R2681 Unsteadiness on feet: Secondary | ICD-10-CM | POA: Diagnosis present

## 2022-08-18 DIAGNOSIS — J9611 Chronic respiratory failure with hypoxia: Secondary | ICD-10-CM | POA: Diagnosis present

## 2022-08-18 DIAGNOSIS — Z881 Allergy status to other antibiotic agents status: Secondary | ICD-10-CM

## 2022-08-18 DIAGNOSIS — I10 Essential (primary) hypertension: Secondary | ICD-10-CM | POA: Diagnosis not present

## 2022-08-18 DIAGNOSIS — J4489 Other specified chronic obstructive pulmonary disease: Secondary | ICD-10-CM | POA: Diagnosis present

## 2022-08-18 DIAGNOSIS — Z95810 Presence of automatic (implantable) cardiac defibrillator: Secondary | ICD-10-CM | POA: Diagnosis not present

## 2022-08-18 DIAGNOSIS — I482 Chronic atrial fibrillation, unspecified: Secondary | ICD-10-CM | POA: Diagnosis present

## 2022-08-18 DIAGNOSIS — M6281 Muscle weakness (generalized): Secondary | ICD-10-CM | POA: Diagnosis present

## 2022-08-18 DIAGNOSIS — Z79899 Other long term (current) drug therapy: Secondary | ICD-10-CM

## 2022-08-18 DIAGNOSIS — I130 Hypertensive heart and chronic kidney disease with heart failure and stage 1 through stage 4 chronic kidney disease, or unspecified chronic kidney disease: Secondary | ICD-10-CM | POA: Diagnosis present

## 2022-08-18 DIAGNOSIS — I272 Pulmonary hypertension, unspecified: Secondary | ICD-10-CM | POA: Diagnosis present

## 2022-08-18 DIAGNOSIS — Z8249 Family history of ischemic heart disease and other diseases of the circulatory system: Secondary | ICD-10-CM | POA: Diagnosis not present

## 2022-08-18 DIAGNOSIS — I5043 Acute on chronic combined systolic (congestive) and diastolic (congestive) heart failure: Secondary | ICD-10-CM | POA: Diagnosis present

## 2022-08-18 DIAGNOSIS — D631 Anemia in chronic kidney disease: Secondary | ICD-10-CM | POA: Diagnosis present

## 2022-08-18 DIAGNOSIS — N183 Chronic kidney disease, stage 3 unspecified: Secondary | ICD-10-CM | POA: Diagnosis present

## 2022-08-18 DIAGNOSIS — I340 Nonrheumatic mitral (valve) insufficiency: Secondary | ICD-10-CM | POA: Diagnosis present

## 2022-08-18 DIAGNOSIS — J9610 Chronic respiratory failure, unspecified whether with hypoxia or hypercapnia: Secondary | ICD-10-CM | POA: Diagnosis not present

## 2022-08-18 DIAGNOSIS — I2510 Atherosclerotic heart disease of native coronary artery without angina pectoris: Secondary | ICD-10-CM | POA: Diagnosis present

## 2022-08-18 DIAGNOSIS — J449 Chronic obstructive pulmonary disease, unspecified: Secondary | ICD-10-CM | POA: Diagnosis not present

## 2022-08-18 DIAGNOSIS — J9811 Atelectasis: Secondary | ICD-10-CM | POA: Diagnosis present

## 2022-08-18 DIAGNOSIS — N1832 Chronic kidney disease, stage 3b: Secondary | ICD-10-CM | POA: Diagnosis present

## 2022-08-18 DIAGNOSIS — N184 Chronic kidney disease, stage 4 (severe): Secondary | ICD-10-CM | POA: Diagnosis present

## 2022-08-18 DIAGNOSIS — E782 Mixed hyperlipidemia: Secondary | ICD-10-CM | POA: Diagnosis present

## 2022-08-18 DIAGNOSIS — Z7951 Long term (current) use of inhaled steroids: Secondary | ICD-10-CM | POA: Diagnosis not present

## 2022-08-18 DIAGNOSIS — Z885 Allergy status to narcotic agent status: Secondary | ICD-10-CM | POA: Diagnosis not present

## 2022-08-18 DIAGNOSIS — I480 Paroxysmal atrial fibrillation: Secondary | ICD-10-CM | POA: Diagnosis present

## 2022-08-18 DIAGNOSIS — I472 Ventricular tachycardia, unspecified: Secondary | ICD-10-CM | POA: Diagnosis present

## 2022-08-18 DIAGNOSIS — Z951 Presence of aortocoronary bypass graft: Secondary | ICD-10-CM | POA: Diagnosis not present

## 2022-08-18 LAB — CBC WITH DIFFERENTIAL/PLATELET
Abs Immature Granulocytes: 0.03 10*3/uL (ref 0.00–0.07)
Basophils Absolute: 0 10*3/uL (ref 0.0–0.1)
Basophils Relative: 1 %
Eosinophils Absolute: 0.1 10*3/uL (ref 0.0–0.5)
Eosinophils Relative: 1 %
HCT: 34.7 % — ABNORMAL LOW (ref 36.0–46.0)
Hemoglobin: 11.1 g/dL — ABNORMAL LOW (ref 12.0–15.0)
Immature Granulocytes: 0 %
Lymphocytes Relative: 20 %
Lymphs Abs: 1.8 10*3/uL (ref 0.7–4.0)
MCH: 31.6 pg (ref 26.0–34.0)
MCHC: 32 g/dL (ref 30.0–36.0)
MCV: 98.9 fL (ref 80.0–100.0)
Monocytes Absolute: 1.2 10*3/uL — ABNORMAL HIGH (ref 0.1–1.0)
Monocytes Relative: 13 %
Neutro Abs: 5.8 10*3/uL (ref 1.7–7.7)
Neutrophils Relative %: 65 %
Platelets: 196 10*3/uL (ref 150–400)
RBC: 3.51 MIL/uL — ABNORMAL LOW (ref 3.87–5.11)
RDW: 15.8 % — ABNORMAL HIGH (ref 11.5–15.5)
WBC: 8.9 10*3/uL (ref 4.0–10.5)
nRBC: 0 % (ref 0.0–0.2)

## 2022-08-18 LAB — COMPREHENSIVE METABOLIC PANEL
ALT: 12 U/L (ref 0–44)
AST: 25 U/L (ref 15–41)
Albumin: 4.3 g/dL (ref 3.5–5.0)
Alkaline Phosphatase: 62 U/L (ref 38–126)
Anion gap: 13 (ref 5–15)
BUN: 50 mg/dL — ABNORMAL HIGH (ref 8–23)
CO2: 30 mmol/L (ref 22–32)
Calcium: 9.5 mg/dL (ref 8.9–10.3)
Chloride: 96 mmol/L — ABNORMAL LOW (ref 98–111)
Creatinine, Ser: 1.86 mg/dL — ABNORMAL HIGH (ref 0.44–1.00)
GFR, Estimated: 26 mL/min — ABNORMAL LOW (ref >60)
Glucose, Bld: 91 mg/dL (ref 70–99)
Potassium: 4.4 mmol/L (ref 3.5–5.1)
Sodium: 139 mmol/L (ref 135–145)
Total Bilirubin: 1.5 mg/dL — ABNORMAL HIGH (ref 0.3–1.2)
Total Protein: 7.5 g/dL (ref 6.5–8.1)

## 2022-08-18 LAB — BRAIN NATRIURETIC PEPTIDE: B Natriuretic Peptide: 623 pg/mL — ABNORMAL HIGH (ref 0.0–100.0)

## 2022-08-18 LAB — MAGNESIUM: Magnesium: 2.4 mg/dL (ref 1.7–2.4)

## 2022-08-18 MED ORDER — DOFETILIDE 125 MCG PO CAPS
125.0000 ug | ORAL_CAPSULE | Freq: Two times a day (BID) | ORAL | Status: DC
  Administered 2022-08-18 – 2022-08-23 (×10): 125 ug via ORAL
  Filled 2022-08-18 (×12): qty 1

## 2022-08-18 MED ORDER — UMECLIDINIUM BROMIDE 62.5 MCG/ACT IN AEPB
1.0000 | INHALATION_SPRAY | Freq: Every day | RESPIRATORY_TRACT | Status: DC
  Administered 2022-08-19 – 2022-08-23 (×5): 1 via RESPIRATORY_TRACT
  Filled 2022-08-18: qty 7

## 2022-08-18 MED ORDER — APIXABAN 2.5 MG PO TABS
2.5000 mg | ORAL_TABLET | Freq: Two times a day (BID) | ORAL | Status: DC
  Administered 2022-08-18 – 2022-08-23 (×10): 2.5 mg via ORAL
  Filled 2022-08-18 (×10): qty 1

## 2022-08-18 MED ORDER — BISOPROLOL FUMARATE 5 MG PO TABS
5.0000 mg | ORAL_TABLET | Freq: Every day | ORAL | Status: DC
  Administered 2022-08-19 – 2022-08-23 (×5): 5 mg via ORAL
  Filled 2022-08-18 (×5): qty 1

## 2022-08-18 MED ORDER — ACETAMINOPHEN 325 MG PO TABS: 650.0000 mg | ORAL_TABLET | Freq: Four times a day (QID) | ORAL | Status: DC | PRN

## 2022-08-18 MED ORDER — FLUTICASONE FUROATE-VILANTEROL 100-25 MCG/ACT IN AEPB
1.0000 | INHALATION_SPRAY | Freq: Every day | RESPIRATORY_TRACT | Status: DC
  Administered 2022-08-19 – 2022-08-23 (×5): 1 via RESPIRATORY_TRACT
  Filled 2022-08-18: qty 28

## 2022-08-18 MED ORDER — AMLODIPINE BESYLATE 5 MG PO TABS
2.5000 mg | ORAL_TABLET | Freq: Every day | ORAL | Status: DC
  Administered 2022-08-19 – 2022-08-23 (×5): 2.5 mg via ORAL
  Filled 2022-08-18 (×5): qty 1

## 2022-08-18 MED ORDER — ONDANSETRON HCL 4 MG/2ML IJ SOLN: 4.0000 mg | Freq: Four times a day (QID) | INTRAMUSCULAR | Status: DC | PRN

## 2022-08-18 MED ORDER — ACETAMINOPHEN 650 MG RE SUPP: 650.0000 mg | Freq: Four times a day (QID) | RECTAL | Status: DC | PRN

## 2022-08-18 MED ORDER — METRONIDAZOLE 500 MG/100ML IV SOLN: 500.0000 mg | Freq: Three times a day (TID) | INTRAVENOUS | Status: DC

## 2022-08-18 MED ORDER — FUROSEMIDE 10 MG/ML IJ SOLN
80.0000 mg | Freq: Once | INTRAMUSCULAR | Status: AC
  Administered 2022-08-18: 80 mg via INTRAVENOUS
  Filled 2022-08-18: qty 8

## 2022-08-18 MED ORDER — ALBUTEROL SULFATE HFA 108 (90 BASE) MCG/ACT IN AERS: 2.0000 | INHALATION_SPRAY | RESPIRATORY_TRACT | Status: DC | PRN

## 2022-08-18 MED ORDER — VANCOMYCIN HCL 250 MG PO CAPS: 500.0000 mg | ORAL_CAPSULE | Freq: Four times a day (QID) | ORAL | Status: DC

## 2022-08-18 MED ORDER — ATORVASTATIN CALCIUM 20 MG PO TABS
20.0000 mg | ORAL_TABLET | Freq: Every day | ORAL | Status: DC
  Administered 2022-08-18 – 2022-08-23 (×6): 20 mg via ORAL
  Filled 2022-08-18 (×6): qty 1

## 2022-08-18 MED ORDER — FUROSEMIDE 10 MG/ML IJ SOLN
60.0000 mg | Freq: Two times a day (BID) | INTRAMUSCULAR | Status: DC
  Administered 2022-08-19 – 2022-08-22 (×7): 60 mg via INTRAVENOUS
  Filled 2022-08-18 (×7): qty 6

## 2022-08-18 MED ORDER — ONDANSETRON HCL 4 MG PO TABS: 4.0000 mg | ORAL_TABLET | Freq: Four times a day (QID) | ORAL | Status: DC | PRN

## 2022-08-18 MED ORDER — LOSARTAN POTASSIUM 50 MG PO TABS
100.0000 mg | ORAL_TABLET | Freq: Every day | ORAL | Status: DC
  Administered 2022-08-19 – 2022-08-23 (×5): 100 mg via ORAL
  Filled 2022-08-18 (×6): qty 2

## 2022-08-18 MED ORDER — POLYETHYLENE GLYCOL 3350 17 G PO PACK: 17.0000 g | PACK | Freq: Every day | ORAL | Status: DC | PRN

## 2022-08-18 NOTE — Telephone Encounter (Signed)
   Called and spoke to the patient's son who reports that she has not experienced any improvement in her dyspnea or lower extremity edema since being on Metolazone.  Reports her weight has overall been unchanged since her last visit. As discussed during her office visit, I feel that she is going to need admission for IV diuresis. He is going to visit his mom later this morning and if she is in agreement, he will bring her to the Emergency Department for likely admission. I will make the ED provider aware.   Signed, Ellsworth Lennox, PA-C 08/18/2022, 8:16 AM

## 2022-08-18 NOTE — Assessment & Plan Note (Addendum)
Pitting bilateral lower extremity edema, dyspnea on exertion, per chart, patient has gained ~11 pounds since January.  Chest x-ray shows small bilateral pleural effusions, no pulmonary edema.  BNP elevated at 623.  Recent echo-EF of 50 to 55%, RV pressure 59.1.  No improvements in edema despite alternating 60 and 80 mg torsemide daily, and metolazone 2.5 mg every 48 hour x 3 doses started 5 days ago. - IV Lasix 80 mg x 1 given, continue 60 twice daily -Monitor renal function closely -Cardiology team to see in a.m. -Strict input output, daily weights daily BMP - Hold home torsemide and metolazone for now

## 2022-08-18 NOTE — H&P (Addendum)
History and Physical    [name] [name]:[id number] DOB: 25-May-1931 DOA: 08/18/2022  PCP: [name], MD   Patient coming from: Home  I have personally briefly reviewed patient's old medical records in [facility]  Chief Complaint: Difficulty Breathing, Leg swelling   HPI: [name] [name] is a 89 y.o. female with medical history significant for  Diastolic CHF, CKD 3b-4, CABG- 1478, Atria fibrillation on anti-coag, chronic respiratory failure on 3 L. Patient was sent to the ED from outpatient cardiology office for diuresis.  Patient has had ongoing bilateral lower extremity swelling, and difficulty breathing with exertion.  Since January this year, patient has put on about 16 pounds-baseline weight of about 154-179.  With ambulation just 10 to 15 feet at home, patient's O2 sats on 3 L dropped to 80s.  No chest pain. She has been compliant with torsemide 80 mg every other day alternating with 60 mg, and metolazone 2.5 was recently added.  Still with no improvement in edema.  ED Course: Temperature 98.5.  Heart rate 61, respiratory 20.  Blood pressure systolic 1 19-1 30.  O2 sat 100% on 3 L.  BNP 623.  Chest x-ray-small bilateral pleural effusions and bibasilar atelectasis.  No pulmonary edema. Lasix 80 mg x 1 given.  Review of Systems: As per HPI all other systems reviewed and negative.  Past Medical History:  Diagnosis Date   Anxiety    Asthma    Chronic atrial fibrillation (HCC)    Chronic back pain    Chronic diastolic heart failure (HCC)    Chronic obstructive pulmonary disease, unspecified (HCC)    Complete heart block (HCC)    COPD (chronic obstructive pulmonary disease) (HCC)    Coronary atherosclerosis of native coronary artery    Multivessel status post CABG, DES PLA March 2006   DDD (degenerative disc disease), lumbar    Essential hypertension    Headache    ICD (implantable cardioverter-defibrillator) in place    Mixed hyperlipidemia    Osteoarthritis    Ventricular  fibrillation (HCC) 2003   a. seen on PPM interrogation a/w syncope    Past Surgical History:  Procedure Laterality Date   CARDIAC DEFIBRILLATOR PLACEMENT     MDT dual chamber ICD   CARDIOVERSION N/A 08/09/2014   Procedure: CARDIOVERSION;  Surgeon: Thurmon Fair, MD;  Location: [facility] ENDOS[facility];  Service: Cardiovascular;  Laterality: N/A;   CORONARY ARTERY BYPASS GRAFT     LIMA to LAD, SVG to diagonal, SVG to ramus and OM   ICD GENERATOR CHANGEOUT N/A 08/06/2021   Procedure: ICD GENERATOR CHANGEOUT;  Surgeon: Marinus Maw, MD;  Location: [facility] INVASIVE CV LAB;  Service: Cardiovascular;  Laterality: N/A;   IMPLANTABLE CARDIOVERTER DEFIBRILLATOR GENERATOR CHANGE N/A 09/25/2013   Procedure: IMPLANTABLE CARDIOVERTER DEFIBRILLATOR GENERATOR CHANGE;  Surgeon: Marinus Maw, MD;  Location: [facility] CATH LAB;  Service: Cardiovascular;  Laterality: N/A;   LEAD REVISION N/A 09/29/2013   Procedure: LEAD REVISION;  Surgeon: Duke Salvia, MD;  Location: [facility] CATH LAB;  Service: Cardiovascular;  Laterality: N/A;   TONSILLECTOMY     YAG LASER APPLICATION Left 12/27/2012   Procedure: YAG LASER APPLICATION;  Surgeon: Loraine Leriche T. Nile Riggs, MD;  Location: [facility] ORS;  Service: Ophthalmology;  Laterality: Left;   YAG LASER APPLICATION Right 01/10/2013   Procedure: YAG LASER APPLICATION;  Surgeon: Loraine Leriche T. Nile Riggs, MD;  Location: [facility] ORS;  Service: Ophthalmology;  Laterality: Right;     reports that she has never smoked. She has never used  smokeless tobacco. She reports that she does not drink alcohol and does not use drugs.  Allergies  Allergen Reactions   Codeine Nausea And Vomiting   Fentanyl Itching    Itching, redness to scalp and neck   Keflex [Cephalexin] Itching and Rash    Family History  Problem Relation Age of Onset   Heart attack Father    Heart attack Brother     Prior to Admission medications   Medication Sig Start Date End Date Taking? Authorizing Provider  acetaminophen (TYLENOL) 500 MG tablet Take 500 mg  by mouth every morning.    [provider]  albuterol (PROVENTIL HFA;VENTOLIN HFA) 108 (90 BASE) MCG/ACT inhaler Inhale 2 puffs into the lungs every 6 (six) hours as needed for wheezing or shortness of breath. 07/05/14   Jodelle Gross, NP  amLODipine (NORVASC) 2.5 MG tablet Take 1 tablet (2.5 mg total) by mouth daily. 07/01/22 06/26/23  Jonelle Sidle, MD  amoxicillin-clavulanate (AUGMENTIN) 875-125 MG tablet Take 1 tablet by mouth every 12 (twelve) hours. Patient not taking: Reported on 08/12/2022 06/14/22   Marita Kansas, PA-C  apixaban (ELIQUIS) 2.5 MG TABS tablet TAKE (1) TABLET BY MOUTH TWICE DAILY. Patient taking differently: Take 2.5 mg by mouth 2 (two) times daily. 03/16/22   Jonelle Sidle, MD  atorvastatin (LIPITOR) 20 MG tablet TAKE ONE TABLET BY MOUTH ONCE DAILY. 04/10/22   Jonelle Sidle, MD  bisoprolol (ZEBETA) 10 MG tablet Take 1 tablet (10 mg total) by mouth daily. Patient taking differently: Take 5 mg by mouth daily. 04/23/22   Nyoka Cowden, MD  Calcium Carbonate-Vitamin D (CALCIUM 600 + D PO) Take 1 tablet by mouth 2 (two) times daily.    [provider]  Cholecalciferol (VITAMIN D) 2000 UNITS CAPS Take 2,000 Units by mouth at bedtime.    [provider]  dofetilide (TIKOSYN) 125 MCG capsule TAKE (1) CAPSULE BY MOUTH TWICE DAILY. Patient taking differently: Take 125 mcg by mouth 2 (two) times daily. 04/29/22   Marinus Maw, MD  Fluticasone-Umeclidin-Vilant (TRELEGY ELLIPTA) 100-62.5-25 MCG/ACT AEPB Inhale 1 puff into the lungs daily. 07/20/22   Nyoka Cowden, MD  folic acid (FOLVITE) 1 MG tablet Take 1 mg by mouth 2 (two) times daily.    [provider]  losartan (COZAAR) 100 MG tablet TAKE (1) TABLET BY MOUTH DAILY. Patient taking differently: Take 100 mg by mouth daily. 03/31/22   Jonelle Sidle, MD  metolazone (ZAROXOLYN) 2.5 MG tablet Take every other day 30 minutes prior to Torsemide for 3 doses. Then stop 08/13/22   [place] Ouch,  [place] M, PA-C  Polyethyl Glycol-Propyl Glycol (SYSTANE OP) Place 1 drop into both eyes daily as needed (Dry eyes).    [provider]  potassium chloride SA (KLOR-CON M) 20 MEQ tablet TAKE (1) TABLET BY MOUTH 3 TIMES DAILY. Patient taking differently: Take 20 mEq by mouth 3 (three) times daily. Pt takes twice daily 12/05/21   Jonelle Sidle, MD  torsemide (DEMADEX) 20 MG tablet Take 80 mg ( 4 Tablets) alternating with 60 mg (3 Tablets) every other day 07/09/22   Jonelle Sidle, MD  triamcinolone cream (KENALOG) 0.1 % Apply 1 application. topically daily as needed (itching).    [provider]    Physical Exam: Vitals:   08/18/22 1343  BP: (!) 119/48  Pulse: 61  Resp: 20  Temp: 98.5 F (36.9 C)  TempSrc: Oral  SpO2: 100%  Weight: 74.9 kg  Height: 5' (  1.524 m)    Constitutional: NAD, calm, comfortable Vitals:   08/18/22 1343  BP: (!) 119/48  Pulse: 61  Resp: 20  Temp: 98.5 F (36.9 C)  TempSrc: Oral  SpO2: 100%  Weight: 74.9 kg  Height: 5' (1.524 m)   Eyes: PERRL, lids and conjunctivae normal ENMT: Mucous membranes are moist.  Neck: normal, supple, no masses, no thyromegaly Respiratory: clear to auscultation bilaterally, no wheezing, no crackles. Normal respiratory effort. No accessory muscle use.  Cardiovascular: Regular rate and rhythm, no murmurs / rubs / gallops.  At least 2+ pitting bilateral lower extremity edema to knees.  Extremities warm. Abdomen: no tenderness, no masses palpated. No hepatosplenomegaly. Bowel sounds positive.  Musculoskeletal: no clubbing / cyanosis. No joint deformity upper and lower extremities. Good ROM, no contractures.  Skin: no rashes, lesions, ulcers. No induration Neurologic: Speech fluent without aphasia, no facial asymmetry, moving all extremities spontaneously Psychiatric: Normal judgment and insight. Alert and oriented x 3. Normal mood.   Labs on Admission: I have personally reviewed following labs and  imaging studies  CBC: Recent Labs  Lab 08/18/22 1451  WBC 8.9  NEUTROABS 5.8  HGB 11.1*  HCT 34.7*  MCV 98.9  PLT 196   Basic Metabolic Panel: Recent Labs  Lab 08/12/22 1609 08/17/22 1131 08/18/22 1451  NA 142 139 139  K 4.8 4.5 4.4  CL 100 96* 96*  CO2 32 33* 30  GLUCOSE 108* 104* 91  BUN 40* 50* 50*  CREATININE 1.43* 1.66* 1.86*  CALCIUM 9.8 9.2 9.5  MG  --  2.3  --    GFR: Estimated Creatinine Clearance: 18.9 mL/min (A) (by C-G formula based on SCr of 1.86 mg/dL (H)). Liver Function Tests: Recent Labs  Lab 08/18/22 1451  AST 25  ALT 12  ALKPHOS 62  BILITOT 1.5*  PROT 7.5  ALBUMIN 4.3    Radiological Exams on Admission: DG Chest 2 View  Result Date: 08/18/2022 CLINICAL DATA:  Shortness of breath. EXAM: CHEST - 2 VIEW COMPARISON:  08/10/2022 FINDINGS: The pacer wires are stable. Stable surgical changes from coronary artery bypass surgery persistent small bilateral pleural effusions and bibasilar atelectasis. No pulmonary edema or pulmonary infiltrates. IMPRESSION: Persistent small bilateral pleural effusions and bibasilar atelectasis. No pulmonary edema. Electronically Signed   By: Rudie Meyer M.D.   On: 08/18/2022 14:22    EKG: Independently reviewed.  Pacer spikes present rate 61.  No significant change from prior.  Assessment/Plan Principal Problem:   Acute on chronic diastolic (congestive) heart failure (HCC) Active Problems:   Essential hypertension, benign   Automatic implantable cardioverter-defibrillator in situ   Chronic kidney disease, stage III (moderate) (HCC)   Chronic atrial fibrillation (HCC)   Chronic obstructive pulmonary disease, unspecified (HCC)   Chronic respiratory failure with hypoxia (HCC)   Assessment and Plan: * Acute on chronic diastolic (congestive) heart failure (HCC) Pitting bilateral lower extremity edema, dyspnea on exertion, per chart, patient has gained ~11 pounds since January.  Chest x-ray shows small bilateral  pleural effusions, no pulmonary edema.  BNP elevated at 623.  Recent echo-EF of 50 to 55%, RV pressure 59.1.  No improvements in edema despite alternating 60 and 80 mg torsemide daily, and metolazone 2.5 mg every 48 hour x 3 doses started 5 days ago. - IV Lasix 80 mg x 1 given, continue 60 twice daily -Monitor renal function closely -Cardiology team to see in a.m. -Strict input output, daily weights daily BMP - Hold home torsemide and metolazone for  now  Chronic obstructive pulmonary disease, unspecified (HCC) Repeated with chronic respiratory failure on 3 L.  Stable. -Resume home regimen  Chronic atrial fibrillation (HCC) Stable.  Rate controlled and on anticoagulation -Resume Tikosyn, Eliquis  Chronic kidney disease, stage III (moderate) (HCC) CKD stage III B/ 4 -Monitor with IV diuresis -Resume losartan  Essential hypertension, benign Stable. -Resume bisoprolol 10 mg, 100mg  losartan, 2.5 mg Norvasc   DVT prophylaxis: Eliquis Code Status: FULL code-Confirmed with patient and son at bedside. Family Communication: Patients son at bedside Disposition Plan: > 2 days Consults called:  Cardiology Admission status:  Inpt Tele  I certify that at the point of admission it is my clinical judgment that the patient will require inpatient hospital care spanning beyond 2 midnights from the point of admission due to high intensity of service, high risk for further deterioration and high frequency of surveillance required.  Author: [name], MD 08/18/2022 4:44 PM  For on call review [email or web address].

## 2022-08-18 NOTE — Assessment & Plan Note (Signed)
CKD stage III B/ 4 -Monitor with IV diuresis -Resume losartan

## 2022-08-18 NOTE — Assessment & Plan Note (Addendum)
Stable. -Resume bisoprolol 10 mg, 100mg  losartan, 2.5 mg Norvasc

## 2022-08-18 NOTE — ED Triage Notes (Signed)
Pt in c/o bil lower leg edema, pt reports that she spoke with her her cardiology PA who instructed her to come to the ED for eval, pt reports no improvement with urine output with an increase in her diuretic, pt experiencing intermittent desaturation with exertion while on 3 L [place] at baseline, pt A&O x4

## 2022-08-18 NOTE — ED Notes (Addendum)
Son states pt is a Full Code.

## 2022-08-18 NOTE — Assessment & Plan Note (Signed)
Repeated with chronic respiratory failure on 3 L.  Stable. -Resume home regimen

## 2022-08-18 NOTE — ED Provider Notes (Signed)
[place] EMERGENCY DEPARTMENT AT [facility] Provider Note   CSN: [id number] Arrival date & time: 08/18/22  1322     History  No chief complaint on file.   [name] [name] is a 89 y.o. female.  She has a history of heart failure with reduced ejection fraction, coronary disease, paroxysmal A-fib.  She has been struggling with peripheral edema and cardiology has been adjusting her diuretics which have been limited by her chronic CKD.  She is on Eliquis and Tikosyn for her A-fib.  She was sent in from cardiology today after increasing her diuretics over the weekend without any improvement in her shortness of breath and peripheral edema.  It was recommended that she come here for admission and IV diuresis.  The history is provided by the patient and a relative.  Shortness of Breath Severity:  Moderate Timing:  Constant Progression:  Unchanged Chronicity:  Recurrent Relieved by:  Nothing Worsened by:  Activity Ineffective treatments:  Diuretics Associated symptoms: cough   Associated symptoms: no abdominal pain, no chest pain, no fever, no hemoptysis, no sputum production and no vomiting        Home Medications Prior to Admission medications   Medication Sig Start Date End Date Taking? Authorizing Provider  acetaminophen (TYLENOL) 500 MG tablet Take 500 mg by mouth every morning.    [provider]  albuterol (PROVENTIL HFA;VENTOLIN HFA) 108 (90 BASE) MCG/ACT inhaler Inhale 2 puffs into the lungs every 6 (six) hours as needed for wheezing or shortness of breath. 07/05/14   Jodelle Gross, NP  amLODipine (NORVASC) 2.5 MG tablet Take 1 tablet (2.5 mg total) by mouth daily. 07/01/22 06/26/23  Jonelle Sidle, MD  amoxicillin-clavulanate (AUGMENTIN) 875-125 MG tablet Take 1 tablet by mouth every 12 (twelve) hours. Patient not taking: Reported on 08/12/2022 06/14/22   Marita Kansas, PA-C  apixaban (ELIQUIS) 2.5 MG TABS tablet TAKE (1) TABLET BY MOUTH TWICE DAILY. Patient  taking differently: Take 2.5 mg by mouth 2 (two) times daily. 03/16/22   Jonelle Sidle, MD  atorvastatin (LIPITOR) 20 MG tablet TAKE ONE TABLET BY MOUTH ONCE DAILY. 04/10/22   Jonelle Sidle, MD  bisoprolol (ZEBETA) 10 MG tablet Take 1 tablet (10 mg total) by mouth daily. Patient taking differently: Take 5 mg by mouth daily. 04/23/22   Nyoka Cowden, MD  Calcium Carbonate-Vitamin D (CALCIUM 600 + D PO) Take 1 tablet by mouth 2 (two) times daily.    [provider]  Cholecalciferol (VITAMIN D) 2000 UNITS CAPS Take 2,000 Units by mouth at bedtime.    [provider]  dofetilide (TIKOSYN) 125 MCG capsule TAKE (1) CAPSULE BY MOUTH TWICE DAILY. Patient taking differently: Take 125 mcg by mouth 2 (two) times daily. 04/29/22   Marinus Maw, MD  Fluticasone-Umeclidin-Vilant (TRELEGY ELLIPTA) 100-62.5-25 MCG/ACT AEPB Inhale 1 puff into the lungs daily. 07/20/22   Nyoka Cowden, MD  folic acid (FOLVITE) 1 MG tablet Take 1 mg by mouth 2 (two) times daily.    [provider]  losartan (COZAAR) 100 MG tablet TAKE (1) TABLET BY MOUTH DAILY. Patient taking differently: Take 100 mg by mouth daily. 03/31/22   Jonelle Sidle, MD  metolazone (ZAROXOLYN) 2.5 MG tablet Take every other day 30 minutes prior to Torsemide for 3 doses. Then stop 08/13/22   Iran Ouch, [place] M, PA-C  Polyethyl Glycol-Propyl Glycol (SYSTANE OP) Place 1 drop into both eyes daily as needed (Dry eyes).    [provider]  potassium chloride SA (KLOR-CON M) 20 MEQ tablet TAKE (1) TABLET BY MOUTH 3 TIMES DAILY. Patient taking differently: Take 20 mEq by mouth 3 (three) times daily. Pt takes twice daily 12/05/21   Jonelle Sidle, MD  torsemide (DEMADEX) 20 MG tablet Take 80 mg ( 4 Tablets) alternating with 60 mg (3 Tablets) every other day 07/09/22   Jonelle Sidle, MD  triamcinolone cream (KENALOG) 0.1 % Apply 1 application. topically daily as needed (itching).    [provider]       Allergies    Codeine, Fentanyl, and Keflex [cephalexin]    Review of Systems   Review of Systems  Constitutional:  Negative for fever.  Respiratory:  Positive for cough and shortness of breath. Negative for hemoptysis and sputum production.   Cardiovascular:  Positive for leg swelling. Negative for chest pain.  Gastrointestinal:  Negative for abdominal pain and vomiting.    Physical Exam Updated Vital Signs BP (!) 119/48 (BP Location: Left Arm)   Pulse 61   Temp 98.5 F (36.9 C) (Oral)   Resp 20   Ht 5' (1.524 m)   Wt 74.9 kg   SpO2 100%   BMI 32.24 kg/m  Physical Exam Vitals and nursing note reviewed.  Constitutional:      General: She is not in acute distress.    Appearance: Normal appearance. She is well-developed.  HENT:     Head: Normocephalic and atraumatic.  Eyes:     Conjunctiva/sclera: Conjunctivae normal.  Cardiovascular:     Rate and Rhythm: Normal rate and regular rhythm.     Heart sounds: Murmur heard.     Comments: Pacer AICD left upper chest nontender Pulmonary:     Effort: Tachypnea and accessory muscle usage present. No respiratory distress.     Breath sounds: Normal breath sounds.  Abdominal:     Palpations: Abdomen is soft.     Tenderness: There is no abdominal tenderness. There is no guarding or rebound.  Musculoskeletal:     Cervical back: Neck supple.     Right lower leg: Edema present.     Left lower leg: Edema present.  Skin:    General: Skin is warm and dry.     Capillary Refill: Capillary refill takes less than 2 seconds.  Neurological:     General: No focal deficit present.     Mental Status: She is alert.     ED Results / Procedures / Treatments   Labs (all labs ordered are listed, but only abnormal results are displayed) Labs Reviewed  CBC WITH DIFFERENTIAL/PLATELET - Abnormal; Notable for the following components:      Result Value   RBC 3.51 (*)    Hemoglobin 11.1 (*)    HCT 34.7 (*)    RDW 15.8 (*)    Monocytes  Absolute 1.2 (*)    All other components within normal limits  COMPREHENSIVE METABOLIC PANEL - Abnormal; Notable for the following components:   Chloride 96 (*)    BUN 50 (*)    Creatinine, Ser 1.86 (*)    Total Bilirubin 1.5 (*)    GFR, Estimated 26 (*)    All other components within normal limits  BRAIN NATRIURETIC PEPTIDE - Abnormal; Notable for the following components:   B Natriuretic Peptide 623.0 (*)    All other components within normal limits  MAGNESIUM  BASIC METABOLIC PANEL    EKG EKG Interpretation  Date/Time:  Tuesday August 18 2022 14:01:35 EDT Ventricular Rate:  61 PR Interval:  206 QRS Duration: 170 QT Interval:  507 QTC Calculation: 511 R Axis:   -81 Text Interpretation: AV PACED RHYTHM No significant change since prior 2/24 Confirmed by Meridee Score [phone]) on 08/18/2022 2:13:58 PM  Radiology DG Chest 2 View  Result Date: 08/18/2022 CLINICAL DATA:  Shortness of breath. EXAM: CHEST - 2 VIEW COMPARISON:  08/10/2022 FINDINGS: The pacer wires are stable. Stable surgical changes from coronary artery bypass surgery persistent small bilateral pleural effusions and bibasilar atelectasis. No pulmonary edema or pulmonary infiltrates. IMPRESSION: Persistent small bilateral pleural effusions and bibasilar atelectasis. No pulmonary edema. Electronically Signed   By: Rudie Meyer M.D.   On: 08/18/2022 14:22    Procedures Procedures    Medications Ordered in ED Medications  albuterol (VENTOLIN HFA) 108 (90 Base) MCG/ACT inhaler 2 puff (has no administration in time range)  furosemide (LASIX) injection 80 mg (has no administration in time range)    ED Course/ Medical Decision Making/ A&P Clinical Course as of 08/18/22 1920  Tue Aug 18, 2022  1429 Chest x-ray interpreted by me as probable bilateral effusions no gross infiltrate.  Awaiting radiology reading. [MB]  1515 Cardiology had called me about this patient prior to arrival and stated she needed to be admitted  for IV diuresis.  Cardiology team will be seeing her as a consult during admission. [MB]  1535 Discussed with [facility] hospitalist Dr. Mariea Clonts who will evaluate patient for admission. [MB]    Clinical Course User Index [MB] Terrilee Files, MD                             Medical Decision Making Amount and/or Complexity of Data Reviewed Labs: ordered. Radiology: ordered.  Risk Prescription drug management. Decision regarding hospitalization.   This patient complains of continued edema and worsening shortness of breath; this involves an extensive number of treatment Options and is a complaint that carries with it a high risk of complications and morbidity. The differential includes CHF, peripheral edema, hypoalbuminemia, liver failure, renal failure, metabolic derangement  I ordered, reviewed and interpreted labs, which included CBC with normal white count stable hemoglobin, chemistries with slightly worse CKD, BNP elevated from priors I ordered medication IV Lasix and reviewed PMP when indicated. I ordered imaging studies which included chest x-ray and I independently    visualized and interpreted imaging which showed mild effusions Additional history obtained from patient's son Previous records obtained and reviewed in epic including recent cardiology notes I consulted [facility] hospitalist Dr. Mariea Clonts I and discussed lab and imaging findings and discussed disposition.  Cardiac monitoring reviewed, paced rhythm Social determinants considered, no significant barriers Critical Interventions: None  After the interventions stated above, I reevaluated the patient and found patient to be oxygenating well on her home nasal cannula oxygen Admission and further testing considered, patient has failed outpatient attempts for oral diuresis and so will be admitted to the hospital for IV diuresis.  Patient in agreement with plan for admission.         Final Clinical Impression(s) / ED  Diagnoses Final diagnoses:  Acute on chronic diastolic congestive heart failure [facility])    Rx / DC Orders ED Discharge Orders     None         Terrilee Files, MD 08/18/22 [phone]

## 2022-08-18 NOTE — Assessment & Plan Note (Signed)
Stable.  Rate controlled and on anticoagulation -Resume Tikosyn, Eliquis

## 2022-08-19 DIAGNOSIS — I5033 Acute on chronic diastolic (congestive) heart failure: Secondary | ICD-10-CM | POA: Diagnosis not present

## 2022-08-19 DIAGNOSIS — J9610 Chronic respiratory failure, unspecified whether with hypoxia or hypercapnia: Secondary | ICD-10-CM

## 2022-08-19 DIAGNOSIS — I480 Paroxysmal atrial fibrillation: Secondary | ICD-10-CM | POA: Diagnosis not present

## 2022-08-19 LAB — BASIC METABOLIC PANEL
Anion gap: 11 (ref 5–15)
BUN: 49 mg/dL — ABNORMAL HIGH (ref 8–23)
CO2: 33 mmol/L — ABNORMAL HIGH (ref 22–32)
Calcium: 9 mg/dL (ref 8.9–10.3)
Chloride: 96 mmol/L — ABNORMAL LOW (ref 98–111)
Creatinine, Ser: 1.62 mg/dL — ABNORMAL HIGH (ref 0.44–1.00)
GFR, Estimated: 30 mL/min — ABNORMAL LOW (ref >60)
Glucose, Bld: 96 mg/dL (ref 70–99)
Potassium: 3.5 mmol/L (ref 3.5–5.1)
Sodium: 140 mmol/L (ref 135–145)

## 2022-08-19 MED ORDER — POTASSIUM CHLORIDE CRYS ER 20 MEQ PO TBCR
60.0000 meq | EXTENDED_RELEASE_TABLET | Freq: Once | ORAL | Status: AC
  Administered 2022-08-19: 60 meq via ORAL
  Filled 2022-08-19: qty 3

## 2022-08-19 NOTE — Evaluation (Signed)
Physical Therapy Evaluation Patient Details Name: [name] [name] MRN: [id number] DOB: 1932-02-02 Today's Date: 08/19/2022  History of Present Illness  [name] [name] is a 89 y.o. female with medical history significant for  Diastolic CHF, CKD 3b-4, CABG- 4098, Atria fibrillation on anti-coag, chronic respiratory failure on 3 L.  Patient was sent to the ED from outpatient cardiology office for diuresis.  Patient has had ongoing bilateral lower extremity swelling, and difficulty breathing with exertion.  Since January this year, patient has put on about 16 pounds-baseline weight of about 154-179.  With ambulation just 10 to 15 feet at home, patient's O2 sats on 3 L dropped to 80s.  No chest pain.  She has been compliant with torsemide 80 mg every other day alternating with 60 mg, and metolazone 2.5 was recently added.  Still with no improvement in edema.   Clinical Impression  Patient functioning near baseline for functional mobility and gait with good return for transferring to chair and walking in room using RW without loss of balance.  Patient limited for activities mostly due to c/o fatigue and tolerated sitting up in chair after therapy - nursing staff aware.  Patient will benefit from continued skilled physical therapy in hospital and recommended venue below to increase strength, balance, endurance for safe ADLs and gait.         Recommendations for follow up therapy are one component of a multi-disciplinary discharge planning process, led by the attending physician.  Recommendations may be updated based on patient status, additional functional criteria and insurance authorization.  Follow Up Recommendations       Assistance Recommended at Discharge Set up Supervision/Assistance  Patient can return home with the following  A little help with walking and/or transfers;A little help with bathing/dressing/bathroom;Help with stairs or ramp for entrance;Assistance with cooking/housework    Equipment  Recommendations None recommended by PT  Recommendations for Other Services       Functional Status Assessment Patient has had a recent decline in their functional status and demonstrates the ability to make significant improvements in function in a reasonable and predictable amount of time.     Precautions / Restrictions Precautions Precautions: Fall Restrictions Weight Bearing Restrictions: No      Mobility  Bed Mobility Overal bed mobility: Needs Assistance Bed Mobility: Supine to Sit     Supine to sit: Supervision, Min guard, HOB elevated     General bed mobility comments: required HOB slightly raised    Transfers Overall transfer level: Needs assistance Equipment used: Rolling walker (2 wheels) Transfers: Sit to/from Stand, Bed to chair/wheelchair/BSC Sit to Stand: Supervision   Step pivot transfers: Supervision       General transfer comment: slightly labored movement    Ambulation/Gait Ambulation/Gait assistance: Supervision, Min guard Gait Distance (Feet): 50 Feet Assistive device: Rolling walker (2 wheels) Gait Pattern/deviations: Decreased step length - right, Decreased step length - left, Decreased stride length, Trunk flexed Gait velocity: decreased     General Gait Details: slightly labored cadence without loss of balance using RW  Stairs            Wheelchair Mobility    Modified Rankin (Stroke Patients Only)       Balance Overall balance assessment: Needs assistance Sitting-balance support: Feet supported, No upper extremity supported Sitting balance-Leahy Scale: Good Sitting balance - Comments: seated at EOB   Standing balance support: During functional activity, No upper extremity supported Standing balance-Leahy Scale: Fair Standing balance comment: fair/good using RW  Pertinent Vitals/Pain Pain Assessment Pain Assessment: No/denies pain    Home Living Family/patient expects to be  discharged to:: Private residence Living Arrangements: Alone Available Help at Discharge: Family;Available PRN/intermittently Type of Home: House [facility]) Home Access: Level entry       Home Layout: One level Home Equipment: Firefighter;Shower seat - built in;Grab bars - tub/shower;Wheelchair - manual      Prior Function Prior Level of Function : Independent/Modified Independent             Mobility Comments: household  ambulator using pick up walker ADLs Comments: Independent household ADL's, assisted for community by family     Hand Dominance        Extremity/Trunk Assessment   Upper Extremity Assessment Upper Extremity Assessment: Overall WFL for tasks assessed    Lower Extremity Assessment Lower Extremity Assessment: Generalized weakness    Cervical / Trunk Assessment Cervical / Trunk Assessment: Normal  Communication   Communication: No difficulties  Cognition Arousal/Alertness: Awake/alert Behavior During Therapy: WFL for tasks assessed/performed Overall Cognitive Status: Within Functional Limits for tasks assessed                                          General Comments      Exercises     Assessment/Plan    PT Assessment Patient needs continued PT services  PT Problem List Decreased strength;Decreased activity tolerance;Decreased balance;Decreased mobility       PT Treatment Interventions DME instruction;Gait training;Stair training;Functional mobility training;Therapeutic activities;Therapeutic exercise;Patient/family education;Balance training    PT Goals (Current goals can be found in the Care Plan section)  Acute Rehab PT Goals Patient Stated Goal: return home with family to assist PT Goal Formulation: With patient Time For Goal Achievement: 08/24/22 Potential to Achieve Goals: Good    Frequency Min 3X/week     Co-evaluation               AM-PAC PT "6 Clicks" Mobility  Outcome Measure Help needed  turning from your back to your side while in a flat bed without using bedrails?: None Help needed moving from lying on your back to sitting on the side of a flat bed without using bedrails?: A Little Help needed moving to and from a bed to a chair (including a wheelchair)?: A Little Help needed standing up from a chair using your arms (e.g., wheelchair or bedside chair)?: None Help needed to walk in hospital room?: A Little Help needed climbing 3-5 steps with a railing? : A Little 6 Click Score: 20    End of Session Equipment Utilized During Treatment: Oxygen Activity Tolerance: Patient tolerated treatment well Patient left: in chair;with call bell/phone within reach Nurse Communication: Mobility status PT Visit Diagnosis: Unsteadiness on feet (R26.81);Other abnormalities of gait and mobility (R26.89);Muscle weakness (generalized) (M62.81)    Time: 1610-9604 PT Time Calculation (min) (ACUTE ONLY): 24 min   Charges:   PT Evaluation $PT Eval Moderate Complexity: 1 Mod PT Treatments $Therapeutic Activity: 23-37 mins        3:54 PM, 08/19/22 Ocie Bob, MPT Physical Therapist with [facility] [phone] office [phone] mobile phone

## 2022-08-19 NOTE — TOC Initial Note (Signed)
Transition of Care [facility]) - Initial/Assessment Note    Patient Details  Name: [name] [name] MRN: [id number] Date of Birth: 12-18-1931  Transition of Care [facility]) CM/SW Contact:    Elliot Gault, LCSW Phone Number: 08/19/2022, 1:13 PM  Clinical Narrative:                  Pt admitted from home. Received TOC consult for CHF screen.  Met with pt and her son at bedside. Pt reports that she resides alone in her own home. Son and daughter see her every day and assist as needed. Son takes pt to appointments and prescriptions are delivered from [place]. Pt uses a walker for ambulation and has Home O2 through [employer].  Per pt, she follows CHF diet and completes daily weights. She is established with a cardiologist.  Discussed HH referral at dc with pt. Pt is reluctant to agree to allow referral. Pt's son voices that he thinks this would be a good idea and he and pt will further discuss. MD anticipating dc in 2 days.  Will follow up with pt closer to DC to see if she has changed her mind about accepting [facility] referral.   Expected Discharge Plan: Home w Home Health Services Barriers to Discharge: Continued Medical Work up   Patient Goals and CMS Choice Patient states their goals for this hospitalization and ongoing recovery are:: go home with no [facility]          Expected Discharge Plan and Services In-house Referral: Clinical Social Work     Living arrangements for the past 2 months: Single Family Home                                      Prior Living Arrangements/Services Living arrangements for the past 2 months: Single Family Home Lives with:: Self Patient language and need for interpreter reviewed:: Yes Do you feel safe going back to the place where you live?: Yes      Need for Family Participation in Patient Care: No (Comment) Care giver support system in place?: Yes (comment) Current home services: DME Criminal Activity/Legal Involvement Pertinent  to Current Situation/Hospitalization: No - Comment as needed  Activities of Daily Living Home Assistive Devices/Equipment: Oxygen, Walker (specify type), Other (Comment) (lifeline) ADL Screening (condition at time of admission) Patient's cognitive ability adequate to safely complete daily activities?: Yes Is the patient deaf or have difficulty hearing?: No Does the patient have difficulty seeing, even when wearing glasses/contacts?: No Does the patient have difficulty concentrating, remembering, or making decisions?: No Patient able to express need for assistance with ADLs?: Yes Does the patient have difficulty dressing or bathing?: Yes Independently performs ADLs?: No Communication: Independent Dressing (OT): Needs assistance Is this a change from baseline?: Change from baseline, expected to last <3days Grooming: Independent Feeding: Independent Bathing: Needs assistance Is this a change from baseline?: Change from baseline, expected to last <3 days Toileting: Needs assistance Is this a change from baseline?: Change from baseline, expected to last <3 days In/Out Bed: Needs assistance Is this a change from baseline?: Change from baseline, expected to last <3 days Walks in Home: Needs assistance Is this a change from baseline?: Change from baseline, expected to last <3 days Does the patient have difficulty walking or climbing stairs?: Yes Weakness of Legs: Both Weakness of Arms/Hands: None  Permission Sought/Granted  Emotional Assessment Appearance:: Appears younger than stated age Attitude/Demeanor/Rapport: Engaged Affect (typically observed): Pleasant Orientation: : Oriented to Self, Oriented to Place, Oriented to  Time, Oriented to Situation Alcohol / Substance Use: Not Applicable Psych Involvement: No (comment)  Admission diagnosis:  Acute on chronic diastolic (congestive) heart failure (HCC) [I50.33] Patient Active Problem List   Diagnosis Date Noted    Acute on chronic diastolic (congestive) heart failure (HCC) 08/18/2022   Cellulitis of right leg 07/02/2022   Chronic obstructive asthma 05/22/2022   Chronic respiratory failure with hypoxia (HCC) 05/22/2022   Exercise hypoxemia 04/28/2022   Essential (primary) hypertension    Hyperlipidemia, unspecified    Presence of cardiac pacemaker    Primary pulmonary hypertension (HCC)    Unspecified osteoarthritis, unspecified site    Anxiety disorder, unspecified    Atherosclerotic heart disease of native coronary artery without angina pectoris    Chronic atrial fibrillation (HCC)    Chronic diastolic (congestive) heart failure (HCC)    Chronic obstructive pulmonary disease, unspecified (HCC)    Headache    Heart failure, unspecified (HCC)    Unspecified fall, initial encounter    Degenerative disc disease, cervical 02/09/2018   Acute respiratory failure with hypoxia (HCC) 05/07/2017   Chronic kidney disease, stage III (moderate) (HCC) 05/07/2017   Chronic obstructive asthma with acute exacerbation (HCC) 05/05/2017   COPD exacerbation (HCC) 05/04/2017   Pacemaker complications 09/28/2013   CAP (community acquired pneumonia) 07/19/2013   DOE (dyspnea on exertion) 07/19/2013   Chronic diastolic heart failure (HCC) 04/11/2013   Aortic stenosis 10/18/2012   Encounter for long-term (current) use of anticoagulants 10/08/2011   Mixed hyperlipidemia 08/25/2008   Essential hypertension, benign 08/25/2008   Coronary atherosclerosis of native coronary artery 08/25/2008   VENTRICULAR TACHYCARDIA 08/25/2008   Automatic implantable cardioverter-defibrillator in situ 08/25/2008   PCP:  [name], MD Pharmacy:   [facility] [place] - [address] [place] [address] Phone: [phone] Fax: [phone]     Social Determinants of Health (SDOH) Social History: SDOH Screenings   Food Insecurity: No Food Insecurity (08/18/2022)  Housing: Low Risk   (08/18/2022)  Transportation Needs: No Transportation Needs (08/18/2022)  Utilities: Not At Risk (08/18/2022)  Tobacco Use: Low Risk  (08/18/2022)   SDOH Interventions:     Readmission Risk Interventions     No data to display

## 2022-08-19 NOTE — Progress Notes (Signed)
PROGRESS NOTE     [name] [name], is a 89 y.o. female, DOB - 1931-07-05, ZOX:[id number]  Admit date - 08/18/2022   Admitting Physician [name], MD  Outpatient Primary MD for the patient is [name], MD  LOS - 1  CC---shob       Brief Narrative:  89 y.o. female with medical history significant for  Diastolic CHF, CKD 3b-4, CABG- 8119, Atria fibrillation on anti-coag, chronic respiratory failure on 3 L admitted with acute diastolic CHF exacerbation on 08/18/2022    -Assessment and Plan: 1)Acute on chronic diastolic (congestive) heart failure (HCC) -Admit to with weight gain, no extremity edema, elevated BNP, chest x-ray findings of pleural effusions and worsening shortness of breath after failing oral diuretics at home  -Cardiology consult appreciated  -Recent echo-EF of 50 to 55%, RV pressure 59.1.   -PTA patient was on torsemide daily (alternating between 60 and 80 mg every daily), and metolazone 2.5 mg   -Collagen consult appreciated -Appears to be diuresing well -c/n IV Lasix 60 mg every 12  2)Chronic obstructive pulmonary disease, unspecified (HCC) -Currently requiring 4 L per nasal cannula at baseline uses 3 L per nasal cannula  3)Chronic atrial fibrillation/history of V. tach -status post AICD placement Stable.  -Continue Tikosyn, bisoprolol, Eliquis -Follow-up with Dr. Ladona Ridgel as outpatient  4)CKD stage -3B -monitor renal function closely with diuresis -renally adjust medications, avoid nephrotoxic agents / dehydration  / hypotension  5)HTN- Stable. C/n  bisoprolol 10 mg, 100mg  losartan, 2.5 mg Norvasc  6)CAD--- chest pain-free -s/p CABG in 1998 with LIMA-LAD, SVG-Diagonal, SVG-RI-OM, DES to PLA in 2006 and she did have a low risk NST in 09/2019.  -Send lipid on bisoprolol -No aspirin as patient is already on Eliquis  7)Generalized weakness and deconditioning----PT eval appreciated -Anticipate discharge home with home health services  Status is:  Inpatient   Disposition: The patient is from: Home              Anticipated d/c is to: Home with home health services              Anticipated d/c date is: 2 days              Patient currently is not medically stable to d/c. Barriers: Not Clinically Stable-   Code Status :  -  Code Status: Full Code   Family Communication:    NA (patient is alert, awake and coherent)  Discussed with son at bedside  DVT Prophylaxis  :   - SCDs  apixaban (ELIQUIS) tablet 2.5 mg Start: 08/18/22 2200 apixaban (ELIQUIS) tablet 2.5 mg   Lab Results  Component Value Date   PLT 196 08/18/2022    Inpatient Medications  Scheduled Meds:  amLODipine  2.5 mg Oral Daily   apixaban  2.5 mg Oral BID   atorvastatin  20 mg Oral Daily   bisoprolol  5 mg Oral Daily   dofetilide  125 mcg Oral BID   fluticasone furoate-vilanterol  1 puff Inhalation Daily   And   umeclidinium bromide  1 puff Inhalation Daily   furosemide  60 mg Intravenous Q12H   losartan  100 mg Oral Daily   Continuous Infusions: PRN Meds:.acetaminophen **OR** acetaminophen, albuterol, ondansetron **OR** ondansetron (ZOFRAN) IV, polyethylene glycol   Anti-infectives (From admission, onward)    Start     Dose/Rate Route Frequency Ordered Stop   08/18/22 1800  vancomycin (VANCOCIN) capsule 500 mg  Status:  Discontinued  500 mg Oral Every 6 hours 08/18/22 1510 08/18/22 1510   08/18/22 1515  metroNIDAZOLE (FLAGYL) IVPB 500 mg  Status:  Discontinued        500 mg 100 mL/hr over 60 Minutes Intravenous Every 8 hours 08/18/22 1510 08/18/22 1510         Subjective: [name] [name] today has no fevers, no emesis,  No chest pain,   - Son at bedside, questions answered -Dyspnea on exertion and hypoxia persist   Objective: Vitals:   08/19/22 0424 08/19/22 0500 08/19/22 0716 08/19/22 1225  BP: (!) 125/53   (!) 134/48  Pulse: 62   60  Resp: 18   20  Temp: 98.6 F (37 C)   98.5 F (36.9 C)  TempSrc:    Oral  SpO2: 98%  98% 100%   Weight:  72.6 kg    Height:        Intake/Output Summary (Last 24 hours) at 08/19/2022 1843 Last data filed at 08/19/2022 1821 Gross per 24 hour  Intake 240 ml  Output 2700 ml  Net -2460 ml   Filed Weights   08/18/22 1343 08/19/22 0500  Weight: 74.9 kg 72.6 kg    Physical Exam  Gen:- Awake Alert, dyspnea on exertion persist HEENT:- [place].AT, No sclera icterus Neck-Supple Neck,No JVD,.  Lungs-diminished breath sounds, no wheezing  CV- S1, S2 normal, irregular , AICD in situ Abd-  +ve B.Sounds, Abd Soft, No tenderness,    Extremity/Skin:- No  edema, pedal pulses present  Psych-affect is appropriate, oriented x3 Neuro-generalized weakness, no new focal deficits, no tremors  Data Reviewed: I have personally reviewed following labs and imaging studies  CBC: Recent Labs  Lab 08/18/22 1451  WBC 8.9  NEUTROABS 5.8  HGB 11.1*  HCT 34.7*  MCV 98.9  PLT 196   Basic Metabolic Panel: Recent Labs  Lab 08/17/22 1131 08/18/22 1451 08/19/22 0344  NA 139 139 140  K 4.5 4.4 3.5  CL 96* 96* 96*  CO2 33* 30 33*  GLUCOSE 104* 91 96  BUN 50* 50* 49*  CREATININE 1.66* 1.86* 1.62*  CALCIUM 9.2 9.5 9.0  MG 2.3 2.4  --    GFR: Estimated Creatinine Clearance: 21.3 mL/min (A) (by C-G formula based on SCr of 1.62 mg/dL (H)). Liver Function Tests: Recent Labs  Lab 08/18/22 1451  AST 25  ALT 12  ALKPHOS 62  BILITOT 1.5*  PROT 7.5  ALBUMIN 4.3   Radiology Studies: DG Chest 2 View  Result Date: 08/18/2022 CLINICAL DATA:  Shortness of breath. EXAM: CHEST - 2 VIEW COMPARISON:  08/10/2022 FINDINGS: The pacer wires are stable. Stable surgical changes from coronary artery bypass surgery persistent small bilateral pleural effusions and bibasilar atelectasis. No pulmonary edema or pulmonary infiltrates. IMPRESSION: Persistent small bilateral pleural effusions and bibasilar atelectasis. No pulmonary edema. Electronically Signed   By: Rudie Meyer M.D.   On: 08/18/2022 14:22      Scheduled Meds:  amLODipine  2.5 mg Oral Daily   apixaban  2.5 mg Oral BID   atorvastatin  20 mg Oral Daily   bisoprolol  5 mg Oral Daily   dofetilide  125 mcg Oral BID   fluticasone furoate-vilanterol  1 puff Inhalation Daily   And   umeclidinium bromide  1 puff Inhalation Daily   furosemide  60 mg Intravenous Q12H   losartan  100 mg Oral Daily   Continuous Infusions:   LOS: 1 day   Shon Hale M.D on 08/19/2022 at 6:43 PM  Go to [email or web address] - for contact info  [facility] - Office  [phone]  If 7PM-7AM, please contact night-coverage [email or web address] 08/19/2022, 6:43 PM

## 2022-08-19 NOTE — Progress Notes (Signed)
Pharmacy: Dofetilide (Tikosyn) - Follow Up Assessment and Electrolyte Replacement  Pharmacy consulted to assist in monitoring and replacing electrolytes in this 89 y.o. female admitted on 08/18/2022 undergoing dofetilide   Labs:    Component Value Date/Time   K 3.5 08/19/2022 0344   MG 2.4 08/18/2022 1451     Plan: Potassium: K 3.5-3.7:  Give KCl 60 mEq po x1   Magnesium: Mg > 2: No additional supplementation needed    Thank you for allowing pharmacy to participate in this patient's care   Tad Moore 08/19/2022  8:00 AM

## 2022-08-19 NOTE — Plan of Care (Signed)
  Problem: Acute Rehab PT Goals(only PT should resolve) Goal: Pt Will Go Supine/Side To Sit Outcome: Progressing Flowsheets (Taken 08/19/2022 1555) Pt will go Supine/Side to Sit:  with modified independence  Independently Goal: Patient Will Transfer Sit To/From Stand Outcome: Progressing Flowsheets (Taken 08/19/2022 1555) Patient will transfer sit to/from stand: with modified independence Goal: Pt Will Transfer Bed To Chair/Chair To Bed Outcome: Progressing Flowsheets (Taken 08/19/2022 1555) Pt will Transfer Bed to Chair/Chair to Bed: with modified independence Goal: Pt Will Ambulate Outcome: Progressing Flowsheets (Taken 08/19/2022 1555) Pt will Ambulate:  75 feet  with supervision  with modified independence  with rolling walker   3:56 PM, 08/19/22 Ocie Bob, MPT Physical Therapist with [facility] [phone] office [phone] mobile phone

## 2022-08-19 NOTE — Consult Note (Addendum)
Cardiology Consultation   Patient ID: ABEL RA MRN: [id number]; DOB: 1931/06/24  Admit date: 08/18/2022 Date of Consult: 08/19/2022  PCP:  [name], MD   [facility] Providers Cardiologist:  Nona Dell, MD  Electrophysiologist:  Lewayne Bunting, MD       Patient Profile:   [name] [name] is a 89 y.o. female with a hx of CAD (s/p CABG in 1998 with LIMA-LAD, SVG-Diagonal, SVG-RI-OM, DES to PLA in 2006, low risk NST in 09/2019), HFpEF (EF 50-55% by echo in 06/2022 with RV function being moderately reduced and PASP elevated to 59.1 mmHg), chronic hypoxic respiratory failure, chronic obstructive asthma, paroxysmal atrial fibrillation, history of VT (s/p ICD placement), HTN, HLD and Stage 3 CKD who is being seen 08/19/2022 for the evaluation of CHF at the request of Dr. Wendall Stade.  History of Present Illness:   Ms. Adel was examined in clinic by myself on 08/12/2022 and reported significantly worsening dyspnea and fatigue over the past 3 to 4 months along with lower extremity edema. Torsemide had previously been titrated by Dr. Diona Browner but this resulted in worsening renal function. Follow-up labs were obtained and it was recommended to consider the use of Metolazone for a few days to see if this would help with diuresis as she wished to avoid admission. Weight was up to 167 lbs (up 11 lbs over 3 months). The patient was contacted yesterday after follow-up labs were obtained and she reported that symptoms had not overall improved with the use of Metolazone, therefore it was recommended she go to the ED for likely admission.   Initial labs showed WBC 8.9, Hgb 11.1, platelets 196, Na+ 139, K+ 4.4 and creatinine 1.86 (baseline 1.3 - 1.4). Mg 2.4. BNP elevated to 623. CXR showing persistent small bilateral pleural effusions and bibasilar atelectasis. EKG shows AV paced rhythm, HR 61.  She received IV Lasix 80mg  while in the ED and has been started on IV Lasix 60mg  BID with a recorded  net output of -2.1 L thus far. Weight on admission was recorded at 165 lbs and down to 160 lbs today.   In talking with the patient today, she reports that her lower extremity edema has improved but she still feels short of breath with minimal activity. She is currently on 3L nasal cannula and is hopeful this can be reduced following diuresis. She denies any recent chest pain or palpitations.   Past Medical History:  Diagnosis Date   Anxiety    Asthma    Chronic atrial fibrillation (HCC)    Chronic back pain    Chronic diastolic heart failure (HCC)    Chronic obstructive pulmonary disease, unspecified (HCC)    Complete heart block (HCC)    COPD (chronic obstructive pulmonary disease) (HCC)    Coronary atherosclerosis of native coronary artery    Multivessel status post CABG, DES PLA March 2006   DDD (degenerative disc disease), lumbar    Essential hypertension    Headache    ICD (implantable cardioverter-defibrillator) in place    Mixed hyperlipidemia    Osteoarthritis    Ventricular fibrillation (HCC) 2003   a. seen on PPM interrogation a/w syncope    Past Surgical History:  Procedure Laterality Date   CARDIAC DEFIBRILLATOR PLACEMENT     MDT dual chamber ICD   CARDIOVERSION N/A 08/09/2014   Procedure: CARDIOVERSION;  Surgeon: Thurmon Fair, MD;  Location: [facility];  Service: Cardiovascular;  Laterality: N/A;   CORONARY ARTERY BYPASS GRAFT  LIMA to LAD, SVG to diagonal, SVG to ramus and OM   ICD GENERATOR CHANGEOUT N/A 08/06/2021   Procedure: ICD GENERATOR CHANGEOUT;  Surgeon: Marinus Maw, MD;  Location: [facility] INVASIVE CV LAB;  Service: Cardiovascular;  Laterality: N/A;   IMPLANTABLE CARDIOVERTER DEFIBRILLATOR GENERATOR CHANGE N/A 09/25/2013   Procedure: IMPLANTABLE CARDIOVERTER DEFIBRILLATOR GENERATOR CHANGE;  Surgeon: Marinus Maw, MD;  Location: [facility] CATH LAB;  Service: Cardiovascular;  Laterality: N/A;   LEAD REVISION N/A 09/29/2013   Procedure: LEAD REVISION;   Surgeon: Duke Salvia, MD;  Location: [facility] CATH LAB;  Service: Cardiovascular;  Laterality: N/A;   TONSILLECTOMY     YAG LASER APPLICATION Left 12/27/2012   Procedure: YAG LASER APPLICATION;  Surgeon: Loraine Leriche T. Nile Riggs, MD;  Location: [facility] ORS;  Service: Ophthalmology;  Laterality: Left;   YAG LASER APPLICATION Right 01/10/2013   Procedure: YAG LASER APPLICATION;  Surgeon: Loraine Leriche T. Nile Riggs, MD;  Location: [facility];  Service: Ophthalmology;  Laterality: Right;     Home Medications:  Prior to Admission medications   Medication Sig Start Date End Date Taking? Authorizing Provider  acetaminophen (TYLENOL) 500 MG tablet Take 650 mg by mouth every morning.   Yes [provider]  albuterol (PROVENTIL HFA;VENTOLIN HFA) 108 (90 BASE) MCG/ACT inhaler Inhale 2 puffs into the lungs every 6 (six) hours as needed for wheezing or shortness of breath. 07/05/14  Yes Jodelle Gross, NP  amLODipine (NORVASC) 2.5 MG tablet Take 1 tablet (2.5 mg total) by mouth daily. 07/01/22 06/26/23 Yes Jonelle Sidle, MD  apixaban (ELIQUIS) 2.5 MG TABS tablet TAKE (1) TABLET BY MOUTH TWICE DAILY. Patient taking differently: Take 2.5 mg by mouth 2 (two) times daily. 03/16/22  Yes Jonelle Sidle, MD  atorvastatin (LIPITOR) 20 MG tablet TAKE ONE TABLET BY MOUTH ONCE DAILY. 04/10/22  Yes Jonelle Sidle, MD  bisoprolol (ZEBETA) 10 MG tablet Take 1 tablet (10 mg total) by mouth daily. Patient taking differently: Take 5 mg by mouth daily. 04/23/22  Yes Nyoka Cowden, MD  Calcium Carbonate-Vitamin D (CALCIUM 600 + D PO) Take 1 tablet by mouth 2 (two) times daily.   Yes [provider]  Cholecalciferol (VITAMIN D) 2000 UNITS CAPS Take 2,000 Units by mouth at bedtime.   Yes [provider]  dofetilide (TIKOSYN) 125 MCG capsule TAKE (1) CAPSULE BY MOUTH TWICE DAILY. Patient taking differently: Take 125 mcg by mouth 2 (two) times daily. 04/29/22  Yes Marinus Maw, MD  Fluticasone-Umeclidin-Vilant (TRELEGY  ELLIPTA) 100-62.5-25 MCG/ACT AEPB Inhale 1 puff into the lungs daily. 07/20/22  Yes Nyoka Cowden, MD  folic acid (FOLVITE) 1 MG tablet Take 1 mg by mouth 2 (two) times daily.   Yes [provider]  losartan (COZAAR) 100 MG tablet TAKE (1) TABLET BY MOUTH DAILY. Patient taking differently: Take 100 mg by mouth daily. 03/31/22  Yes Jonelle Sidle, MD  metolazone (ZAROXOLYN) 2.5 MG tablet Take every other day 30 minutes prior to Torsemide for 3 doses. Then stop 08/13/22  Yes Strader, [place] M, PA-C  Polyethyl Glycol-Propyl Glycol (SYSTANE OP) Place 1 drop into both eyes daily as needed (Dry eyes).   Yes [provider]  potassium chloride SA (KLOR-CON M) 20 MEQ tablet TAKE (1) TABLET BY MOUTH 3 TIMES DAILY. Patient taking differently: Take 20 mEq by mouth 3 (three) times daily. Pt takes twice daily 12/05/21  Yes Jonelle Sidle, MD  torsemide (DEMADEX) 20 MG tablet Take 80 mg ( 4 Tablets)  alternating with 60 mg (3 Tablets) every other day 07/09/22  Yes Jonelle Sidle, MD  triamcinolone cream (KENALOG) 0.1 % Apply 1 application. topically daily as needed (itching).   Yes [provider]  amoxicillin-clavulanate (AUGMENTIN) 875-125 MG tablet Take 1 tablet by mouth every 12 (twelve) hours. Patient not taking: Reported on 08/12/2022 06/14/22   Marita Kansas, PA-C    Inpatient Medications: Scheduled Meds:  amLODipine  2.5 mg Oral Daily   apixaban  2.5 mg Oral BID   atorvastatin  20 mg Oral Daily   bisoprolol  5 mg Oral Daily   dofetilide  125 mcg Oral BID   fluticasone furoate-vilanterol  1 puff Inhalation Daily   And   umeclidinium bromide  1 puff Inhalation Daily   furosemide  60 mg Intravenous Q12H   losartan  100 mg Oral Daily   potassium chloride  60 mEq Oral Once   Continuous Infusions:  PRN Meds: acetaminophen **OR** acetaminophen, albuterol, ondansetron **OR** ondansetron (ZOFRAN) IV, polyethylene glycol  Allergies:    Allergies  Allergen Reactions    Codeine Nausea And Vomiting   Fentanyl Itching    Itching, redness to scalp and neck   Keflex [Cephalexin] Itching and Rash    Social History:   Social History   Socioeconomic History   Marital status: Widowed    Spouse name: Not on file   Number of children: 2   Years of education: Not on file   Highest education level: Not on file  Occupational History   Occupation: Retired    Comment: Copywriter, advertising: RETIRED  Tobacco Use   Smoking status: Never   Smokeless tobacco: Never  Vaping Use   Vaping Use: Never used  Substance and Sexual Activity   Alcohol use: No    Alcohol/week: 0.0 standard drinks of alcohol   Drug use: No   Sexual activity: Never  Other Topics Concern   Not on file  Social History Narrative   Not on file   Social Determinants of Health   Financial Resource Strain: Not on file  Food Insecurity: No Food Insecurity (08/18/2022)   Hunger Vital Sign    Worried About Running Out of Food in the Last Year: Never true    Ran Out of Food in the Last Year: Never true  Transportation Needs: No Transportation Needs (08/18/2022)   PRAPARE - Administrator, Civil Service (Medical): No    Lack of Transportation (Non-Medical): No  Physical Activity: Not on file  Stress: Not on file  Social Connections: Not on file  Intimate Partner Violence: Not At Risk (08/18/2022)   Humiliation, Afraid, Rape, and Kick questionnaire    Fear of Current or Ex-Partner: No    Emotionally Abused: No    Physically Abused: No    Sexually Abused: No    Family History:    Family History  Problem Relation Age of Onset   Heart attack Father    Heart attack Brother      ROS:  Please see the history of present illness.   All other ROS reviewed and negative.     Physical Exam/Data:   Vitals:   08/18/22 2017 08/19/22 0424 08/19/22 0500 08/19/22 0716  BP: (!) 119/57 (!) 125/53    Pulse: 63 62    Resp: 20 18    Temp: 98 F (36.7 C) 98.6 F (37 C)     TempSrc: Oral     SpO2: 99% 98%  98%  Weight:   72.6 kg   Height:        Intake/Output Summary (Last 24 hours) at 08/19/2022 0840 Last data filed at 08/19/2022 1610 Gross per 24 hour  Intake 240 ml  Output 2400 ml  Net -2160 ml      08/19/2022    5:00 AM 08/18/2022    1:43 PM 08/12/2022    3:12 PM  Last 3 Weights  Weight (lbs) 160 lb 0.9 oz 165 lb 1.6 oz 167 lb  Weight (kg) 72.6 kg 74.889 kg 75.751 kg     Body mass index is 31.26 kg/m.  General: Pleasant, elderly female appearing in no acute distress.  HEENT: normal Neck: JVD to jaw line.  Vascular: No carotid bruits; Distal pulses 2+ bilaterally Cardiac:  normal S1, S2; RRR; 2/6 systolic murmur along Apex.  Lungs: rales along bases bilaterally.  Abd: soft, nontender, no hepatomegaly  Ext: 1+ pitting edema bilaterally.  Musculoskeletal:  No deformities, BUE and BLE strength normal and equal Skin: warm and dry  Neuro:  CNs 2-12 intact, no focal abnormalities noted Psych:  Normal affect   EKG:  The EKG was personally reviewed and demonstrates:  AV paced rhythm, HR 61. Telemetry:  Telemetry was personally reviewed and demonstrates: AV pacing, HR in 60's with occasional PVC's.   Relevant CV Studies:  Echocardiogram: 06/2022 IMPRESSIONS     1. Left ventricular ejection fraction, by estimation, is 50 to 55%. Left  ventricular ejection fraction by 3D volume is 50 %. The left ventricle has  low normal function. The left ventricle has no regional wall motion  abnormalities. Left ventricular  diastolic function could not be evaluated.   2. Right ventricular systolic function is moderately reduced. The right  ventricular size is moderately enlarged. There is moderately elevated  pulmonary artery systolic pressure. The estimated right ventricular  systolic pressure is 59.1 mmHg.   3. Left atrial size was severely dilated.   4. Right atrial size was severely dilated.   5. The mitral valve is normal in structure. Mild to moderate  mitral valve  regurgitation.   6. Tricuspid valve regurgitation is moderate to severe.   7. The aortic valve is tricuspid. There is mild calcification of the  aortic valve. There is mild thickening of the aortic valve. Aortic valve  regurgitation is mild. Aortic valve sclerosis is present, with no evidence  of aortic valve stenosis.   8. The inferior vena cava is dilated in size with <50% respiratory  variability, suggesting right atrial pressure of 15 mmHg.   Comparison(s): Prior images reviewed side by side. Tricuspid regurgitation  is much worse and right ventricular dilation and systolic dysfunction is  now present.    Laboratory Data:  High Sensitivity Troponin:  No results for input(s): "TROPONINIHS" in the last 720 hours.   Chemistry Recent Labs  Lab 08/17/22 1131 08/18/22 1451 08/19/22 0344  NA 139 139 140  K 4.5 4.4 3.5  CL 96* 96* 96*  CO2 33* 30 33*  GLUCOSE 104* 91 96  BUN 50* 50* 49*  CREATININE 1.66* 1.86* 1.62*  CALCIUM 9.2 9.5 9.0  MG 2.3 2.4  --   GFRNONAA 29* 26* 30*  ANIONGAP 10 13 11     Recent Labs  Lab 08/18/22 1451  PROT 7.5  ALBUMIN 4.3  AST 25  ALT 12  ALKPHOS 62  BILITOT 1.5*   Lipids No results for input(s): "CHOL", "TRIG", "HDL", "LABVLDL", "LDLCALC", "CHOLHDL" in the last 168 hours.  Hematology Recent Labs  Lab 08/18/22 1451  WBC 8.9  RBC 3.51*  HGB 11.1*  HCT 34.7*  MCV 98.9  MCH 31.6  MCHC 32.0  RDW 15.8*  PLT 196   Thyroid No results for input(s): "TSH", "FREET4" in the last 168 hours.  BNP Recent Labs  Lab 08/12/22 1609 08/18/22 1451  BNP 786.0* 623.0*    DDimer No results for input(s): "DDIMER" in the last 168 hours.   Radiology/Studies:  DG Chest 2 View  Result Date: 08/18/2022 CLINICAL DATA:  Shortness of breath. EXAM: CHEST - 2 VIEW COMPARISON:  08/10/2022 FINDINGS: The pacer wires are stable. Stable surgical changes from coronary artery bypass surgery persistent small bilateral pleural effusions and  bibasilar atelectasis. No pulmonary edema or pulmonary infiltrates. IMPRESSION: Persistent small bilateral pleural effusions and bibasilar atelectasis. No pulmonary edema. Electronically Signed   By: Rudie Meyer M.D.   On: 08/18/2022 14:22     Assessment and Plan:   1. Acute HFpEF/Pulmonary HTN - Currently admitted with an acute CHF exacerbation after having failed outpatient diuresis in the setting of variable renal function and minimal change in her symptoms or weight. Recent echo did show her EF was mildly reduced at 50 to 55% with moderately reduced RV function and PASP elevated to 59.1 mmHg. Dr. Diona Browner had previously recommended referral to the [facility] and this is pending. Doubt she will be a candidate for advanced therapies given her age. - At this time, she is responding well to IV Lasix with a recorded net output of -2.1 L thus far and weight has declined by 5 pounds. Will need to establish new dry weight but previously at 154 lbs in 04/2022. - She is scheduled to start IV Lasix 60 mg twice daily and would continue with this and follow I's and O's along with daily weights. Repeat BMET and Mg in AM. Keep K+ ~ 4.0 given the concurrent use of Tikosyn. Supplementation already ordered by pharmacy for today as K+ was at 3.5 this AM.   2. CAD - She is s/p CABG in 1998 with LIMA-LAD, SVG-Diagonal, SVG-RI-OM, DES to PLA in 2006 and she did have a low risk NST in 09/2019. - She denies any recent chest pain. Continue Atorvastatin 20 mg daily and Bisoprolol 5 mg daily. She is not on ASA given the concurrent use of Eliquis.  3. Paroxysmal Atrial Fibrillation - Recent device interrogation last month showed brief episodes of atrial flutter totaling 0.8% of the time. She remains on Tikosyn 125 mcg twice daily along with Bisoprolol 5 mg daily. - She is on Eliquis 2.5 mg twice daily for anticoagulation which is the appropriate dose at this time given her age, weight and renal function.  4.  HTN - BP  has been well-controlled at 117/51 - 141/46 since admission. She has been continued on Bisoprolol 5 mg daily and Losartan 100 mg daily.  5. History of VT  - She is s/p ICD placement which is followed by Dr. Ladona Ridgel as an outpatient.   6. Stage 3 CKD - Baseline creatinine 1.3 - 1.4. Was elevated to 1.86 on admission, improved to 1.62 today with diuresis. Repeat BMET in AM.   For questions or updates, please contact [facility] Please consult [email or web address] for contact info under    Signed, Ellsworth Lennox, PA-C  08/19/2022 8:40 AM  Attending note Patient seen and discussed with PA [place] Ouch, I agree with her documentation. 89 yo female history of CAD with prior CABG, HFpEF, PAF, history of VT  now with ICD for secondary prevention, CKD 3 admitted with SOB and leg swelling    WBC 8.9 Hgb 11.1 Plt 196 K 1.86 BUN 50 BNP 623  CXR small bilateral effusions EKG AV paced  06/2022 echo: LVEF 50-55%, indet diastolic fxn, mod RV dysfunction, mod RVE, mod pulm HTN PASP 59, severe BAE, mild to mod MR, dilated fixed IVC   1.Acute on chronic HFpEF - 06/2022 echo: LVEF 50-55%, indet diastolic fxn, mod RV dysfunction, mod RVE, mod pulm HTN PASP 59, severe BAE, mild to mod MR, dilated fixed IVC - CXR bilateral small effusions, BNP 623  - negative 2.1 L thus far in admit, she received IV lasix 80mg  x 1 yesterday and due for 60mg  bid today. Downtrend in Cr with diuresis consistent with venous congestion and HF. Remains fluid overloaded, continue IV lasix today - may consider SLGT2i this admit pending renal function  2.PAF -on tikasyn, bisoprolol, eliquis reduced dose based on age and renal function - AV [paced on tele normal rates.   3. Chronic respiratory failure - on 3L [place] at home   Dina Rich MD

## 2022-08-19 NOTE — Progress Notes (Signed)
Pt slept through the night. No c/o pain noted.

## 2022-08-20 DIAGNOSIS — I480 Paroxysmal atrial fibrillation: Secondary | ICD-10-CM | POA: Diagnosis not present

## 2022-08-20 DIAGNOSIS — I5033 Acute on chronic diastolic (congestive) heart failure: Secondary | ICD-10-CM | POA: Diagnosis not present

## 2022-08-20 LAB — BASIC METABOLIC PANEL
Anion gap: 10 (ref 5–15)
BUN: 43 mg/dL — ABNORMAL HIGH (ref 8–23)
CO2: 34 mmol/L — ABNORMAL HIGH (ref 22–32)
Calcium: 9 mg/dL (ref 8.9–10.3)
Chloride: 99 mmol/L (ref 98–111)
Creatinine, Ser: 1.54 mg/dL — ABNORMAL HIGH (ref 0.44–1.00)
GFR, Estimated: 32 mL/min — ABNORMAL LOW (ref >60)
Glucose, Bld: 93 mg/dL (ref 70–99)
Potassium: 3.6 mmol/L (ref 3.5–5.1)
Sodium: 143 mmol/L (ref 135–145)

## 2022-08-20 LAB — MAGNESIUM: Magnesium: 2.2 mg/dL (ref 1.7–2.4)

## 2022-08-20 MED ORDER — POTASSIUM CHLORIDE CRYS ER 20 MEQ PO TBCR
60.0000 meq | EXTENDED_RELEASE_TABLET | Freq: Once | ORAL | Status: AC
  Administered 2022-08-20: 60 meq via ORAL
  Filled 2022-08-20: qty 3

## 2022-08-20 NOTE — Progress Notes (Addendum)
Rounding Note    Patient Name: [name] [name] Date of Encounter: 08/20/2022  [facility] Cardiologist: Nona Dell, MD   Subjective   Breathing improved but not back to baseline. She feels like deconditioning is playing a role as well. Considering Home Health PT. She denies any chest pain or palpitations.   Inpatient Medications    Scheduled Meds:  amLODipine  2.5 mg Oral Daily   apixaban  2.5 mg Oral BID   atorvastatin  20 mg Oral Daily   bisoprolol  5 mg Oral Daily   dofetilide  125 mcg Oral BID   fluticasone furoate-vilanterol  1 puff Inhalation Daily   And   umeclidinium bromide  1 puff Inhalation Daily   furosemide  60 mg Intravenous Q12H   losartan  100 mg Oral Daily   potassium chloride  60 mEq Oral Once   Continuous Infusions:  PRN Meds: acetaminophen **OR** acetaminophen, albuterol, ondansetron **OR** ondansetron (ZOFRAN) IV, polyethylene glycol   Vital Signs    Vitals:   08/20/22 0401 08/20/22 0500 08/20/22 0739 08/20/22 0815  BP: 101/65   (!) 116/41  Pulse: 60   60  Resp: 18     Temp: 98.7 F (37.1 C)     TempSrc:      SpO2: 94%  98% 97%  Weight:  71.2 kg    Height:        Intake/Output Summary (Last 24 hours) at 08/20/2022 0833 Last data filed at 08/20/2022 0554 Gross per 24 hour  Intake 240 ml  Output 2450 ml  Net -2210 ml      08/20/2022    5:00 AM 08/19/2022    5:00 AM 08/18/2022    1:43 PM  Last 3 Weights  Weight (lbs) 156 lb 15.5 oz 160 lb 0.9 oz 165 lb 1.6 oz  Weight (kg) 71.2 kg 72.6 kg 74.889 kg      Telemetry    AV paced, HR in 60's. - Personally Reviewed  ECG    AV paced rhythm, HR 61. - Personally Reviewed  Physical Exam   GEN: Elderly female appearing in no acute distress.   Neck: No JVD Cardiac: RRR, no murmurs, rubs, or gallops.  Respiratory: Rales along bases bilaterally but improved from prior examinations.  GI: Soft, nontender, non-distended  MS: 1+ pitting edema bilaterally; No deformity. Neuro:   Nonfocal  Psych: Normal affect   Labs    High Sensitivity Troponin:  No results for input(s): "TROPONINIHS" in the last 720 hours.   Chemistry Recent Labs  Lab 08/17/22 1131 08/18/22 1451 08/19/22 0344 08/20/22 0402  NA 139 139 140 143  K 4.5 4.4 3.5 3.6  CL 96* 96* 96* 99  CO2 33* 30 33* 34*  GLUCOSE 104* 91 96 93  BUN 50* 50* 49* 43*  CREATININE 1.66* 1.86* 1.62* 1.54*  CALCIUM 9.2 9.5 9.0 9.0  MG 2.3 2.4  --  2.2  PROT  --  7.5  --   --   ALBUMIN  --  4.3  --   --   AST  --  25  --   --   ALT  --  12  --   --   ALKPHOS  --  62  --   --   BILITOT  --  1.5*  --   --   GFRNONAA 29* 26* 30* 32*  ANIONGAP 10 13 11 10     Lipids No results for input(s): "CHOL", "TRIG", "HDL", "LABVLDL", "LDLCALC", "CHOLHDL" in  the last 168 hours.  Hematology Recent Labs  Lab 08/18/22 1451  WBC 8.9  RBC 3.51*  HGB 11.1*  HCT 34.7*  MCV 98.9  MCH 31.6  MCHC 32.0  RDW 15.8*  PLT 196   Thyroid No results for input(s): "TSH", "FREET4" in the last 168 hours.  BNP Recent Labs  Lab 08/18/22 1451  BNP 623.0*    DDimer No results for input(s): "DDIMER" in the last 168 hours.   Radiology    DG Chest 2 View  Result Date: 08/18/2022 CLINICAL DATA:  Shortness of breath. EXAM: CHEST - 2 VIEW COMPARISON:  08/10/2022 FINDINGS: The pacer wires are stable. Stable surgical changes from coronary artery bypass surgery persistent small bilateral pleural effusions and bibasilar atelectasis. No pulmonary edema or pulmonary infiltrates. IMPRESSION: Persistent small bilateral pleural effusions and bibasilar atelectasis. No pulmonary edema. Electronically Signed   By: Rudie Meyer M.D.   On: 08/18/2022 14:22    Cardiac Studies   Echocardiogram: 06/2022 IMPRESSIONS     1. Left ventricular ejection fraction, by estimation, is 50 to 55%. Left  ventricular ejection fraction by 3D volume is 50 %. The left ventricle has  low normal function. The left ventricle has no regional wall motion   abnormalities. Left ventricular  diastolic function could not be evaluated.   2. Right ventricular systolic function is moderately reduced. The right  ventricular size is moderately enlarged. There is moderately elevated  pulmonary artery systolic pressure. The estimated right ventricular  systolic pressure is 59.1 mmHg.   3. Left atrial size was severely dilated.   4. Right atrial size was severely dilated.   5. The mitral valve is normal in structure. Mild to moderate mitral valve  regurgitation.   6. Tricuspid valve regurgitation is moderate to severe.   7. The aortic valve is tricuspid. There is mild calcification of the  aortic valve. There is mild thickening of the aortic valve. Aortic valve  regurgitation is mild. Aortic valve sclerosis is present, with no evidence  of aortic valve stenosis.   8. The inferior vena cava is dilated in size with <50% respiratory  variability, suggesting right atrial pressure of 15 mmHg.   Comparison(s): Prior images reviewed side by side. Tricuspid regurgitation  is much worse and right ventricular dilation and systolic dysfunction is  now present.    Patient Profile     89 y.o. female w/ PMH of CAD (s/p CABG in 1998 with LIMA-LAD, SVG-Diagonal, SVG-RI-OM, DES to PLA in 2006, low risk NST in 09/2019), HFpEF (EF 50-55% by echo in 06/2022 with RV function being moderately reduced and PASP elevated to 59.1 mmHg), chronic hypoxic respiratory failure, chronic obstructive asthma, paroxysmal atrial fibrillation, history of VT (s/p ICD placement), HTN, HLD and Stage 3 CKD who is currently admitted for an acute CHF exacerbation.   Assessment & Plan    1. Acute HFpEF/Pulmonary HTN - Admitted after having failed outpatient diuresis. Recent echo showed her EF was mildly reduced at 50 to 55% with moderately reduced RV function and PASP elevated to 59.1 mmHg. Previously referred to [facility] with appointment pending at this time.  - She is currently receiving  IV Lasix 60mg  BID with a recorded net output of -4.3 L and weight down from 165 lbs to 156 lbs (prior baseline of 154 lbs). Given her appropriate response and improvement in renal function, would continue with IV Lasix today. Will need to establish a new dry weight as she has likely lost fat and muscle  mass over the past few months. Can perhaps add an SGLT2 inhibitor today or tomorrow (GFR improving and now at 32).  2. CAD - She is s/p CABG in 1998 with LIMA-LAD, SVG-Diagonal, SVG-RI-OM, DES to PLA in 2006 and she did have a low risk NST in 09/2019. No recent anginal symptoms.  - Continue Bisoprolol and statin therapy. No ASA since she is on anticoagulation.  - She denies any recent chest pain. Continue Atorvastatin 20 mg daily and Bisoprolol 5 mg daily. She is not on ASA given the concurrent use of Eliquis.   3. Paroxysmal Atrial Fibrillation - Recent device interrogation last month showed brief episodes of atrial flutter totaling 0.8% of the time. AV paced on telemetry this admission. She remains on Tikosyn 125 mcg twice daily along with Bisoprolol 5 mg daily. - She is on Eliquis 2.5 mg twice daily for anticoagulation. Creatinine at 1.54 today. If this trends to < 1.5, would need to adjust her dose to 5mg  BID.   4.  HTN - BP has been well-controlled at 101/65 - 134/48 in the past 24 hours. Continue current medical therapy with Bisoprolol 5 mg daily and Losartan 100 mg daily.   5. History of VT  - She is s/p ICD placement which is followed by Dr. Ladona Ridgel as an outpatient. No significant arrhythmias by review of telemetry.    6. Stage 3 CKD - Baseline creatinine 1.3 - 1.4. Was elevated to 1.86 on admission and improving to 1.54 today with IV diuresis.    7. Chronic Hypoxic Respiratory Failure - On 3L [place] at baseline and followed by Dr. Sherene Sires. Prior PFT's in 05/2022 showed severe obstructive airway disease and severe perfusion defect    For questions or updates, please contact [facility] Please consult [email or web address] for contact info under        Signed, Ellsworth Lennox, PA-C  08/20/2022, 8:33 AM    Attending note Patient seen and discussed with PA [place] Ouch, I agree with her documentation above.   1.Acute on chronic HFpEF - 06/2022 echo: LVEF 50-55%, indet diastolic fxn, mod RV dysfunction, mod RVE, mod pulm HTN PASP 59, severe BAE, mild to mod MR, dilated fixed IVC - CXR bilateral small effusions, BNP 623   - negative 2.2 L yesterdya though I/Os not completey charted, negative 4.3 L since admission.  She is on IV lasix 60mg  bid, downtrend in Cr with diuresis consistent with venous congestion and HF.  - may consider SLGT2i this admit pending renal function with diuresis. GFR is borderline 26-30 for use.  - continue IV diuresis, remains fluid overloaded   2.PAF -on tikasyn, bisoprolol, eliquis reduced dose based on age and renal function - AV paced on tele normal rates.    3. Chronic respiratory failure - on 3L [place] at home    Dina Rich MD

## 2022-08-20 NOTE — Progress Notes (Signed)
EKG done and given to nurse 

## 2022-08-20 NOTE — Progress Notes (Signed)
PROGRESS NOTE     [name] [name], is a 89 y.o. female, DOB - 1931/08/22, ZOX:[id number]  Admit date - 08/18/2022   Admitting Physician [name], MD  Outpatient Primary MD for the patient is [name], MD  LOS - 2  CC---shob       Brief Narrative:  89 y.o. female with medical history significant for  Diastolic CHF, CKD 3b-4, CABG- 8119, Atria fibrillation on anti-coag, chronic respiratory failure on 3 L admitted with acute diastolic CHF exacerbation on 08/18/2022    -Assessment and Plan: 1)Acute on chronic diastolic (congestive) heart failure (HCC) -Admit to with weight gain, no extremity edema, elevated BNP, chest x-ray findings of pleural effusions and worsening shortness of breath after failing oral diuretics at home  -Recent echo-EF of 50 to 55%, RV pressure 59.1.   -PTA patient was on torsemide daily (alternating between 60 and 80 mg every daily), and metolazone 2.5 mg   -Cardiology consult appreciated 08/20/22 -Appears to be diuresing well---fluid balance remains negative, weight is trending down but not back to baseline -Fatigue and dyspnea on exertion improving -c/n IV Lasix 60 mg every 12hr -Pt requires additional IV diuretics, she failed oral diuretics prior to admission)  2)Chronic obstructive pulmonary disease, unspecified (HCC) -Currently requiring 4 L per nasal cannula at baseline uses 3 L per nasal cannula  3)Chronic atrial fibrillation/history of V. tach -status post AICD placement Stable.  -Continue Tikosyn, bisoprolol, Eliquis -Follow-up with Dr. Ladona Ridgel as outpatient  4)CKD stage -3B -monitor renal function closely with diuresis---creatinine trending down slowly with diuresis -renally adjust medications, avoid nephrotoxic agents / dehydration  / hypotension  5)HTN- Stable. C/n  bisoprolol 10 mg, 100mg  losartan, 2.5 mg Norvasc  6)CAD--- chest pain-free -s/p CABG in 1998 with LIMA-LAD, SVG-Diagonal, SVG-RI-OM, DES to PLA in 2006 and she did have a  low risk NST in 09/2019.  Continue atorvastatin 20 mg daily and  Bisoprolol -No aspirin as patient is already on Eliquis  7)Generalized weakness and Deconditioning----PT eval appreciated -Anticipate discharge home with home health services  Status is: Inpatient   Disposition: The patient is from: Home              Anticipated d/c is to: Home with home health services in 1 to 2 days              Anticipated d/c date is: 2 days              Patient currently is not medically stable to d/c. Barriers: Not Clinically Stable-   Code Status :  -  Code Status: Full Code   Family Communication:    NA (patient is alert, awake and coherent)  Discussed with son at bedside  DVT Prophylaxis  :   - SCDs  apixaban (ELIQUIS) tablet 2.5 mg Start: 08/18/22 2200 apixaban (ELIQUIS) tablet 2.5 mg   Lab Results  Component Value Date   PLT 196 08/18/2022   Inpatient Medications  Scheduled Meds:  amLODipine  2.5 mg Oral Daily   apixaban  2.5 mg Oral BID   atorvastatin  20 mg Oral Daily   bisoprolol  5 mg Oral Daily   dofetilide  125 mcg Oral BID   fluticasone furoate-vilanterol  1 puff Inhalation Daily   And   umeclidinium bromide  1 puff Inhalation Daily   furosemide  60 mg Intravenous Q12H   losartan  100 mg Oral Daily   Continuous Infusions: PRN Meds:.acetaminophen **OR** acetaminophen, albuterol, ondansetron **OR** ondansetron (ZOFRAN) IV, polyethylene  glycol   Anti-infectives (From admission, onward)    Start     Dose/Rate Route Frequency Ordered Stop   08/18/22 1800  vancomycin (VANCOCIN) capsule 500 mg  Status:  Discontinued        500 mg Oral Every 6 hours 08/18/22 1510 08/18/22 1510   08/18/22 1515  metroNIDAZOLE (FLAGYL) IVPB 500 mg  Status:  Discontinued        500 mg 100 mL/hr over 60 Minutes Intravenous Every 8 hours 08/18/22 1510 08/18/22 1510      Subjective: [name] [name] today has no fevers, no emesis,  No chest pain,   - Son ([name])  at bedside, questions  answered -Dyspnea on exertion  persist.,..... significant fatigue -Oxygen requirement is getting close to baseline   Objective: Vitals:   08/20/22 0401 08/20/22 0500 08/20/22 0739 08/20/22 0815  BP: 101/65   (!) 116/41  Pulse: 60   60  Resp: 18     Temp: 98.7 F (37.1 C)     TempSrc:      SpO2: 94%  98% 97%  Weight:  71.2 kg    Height:        Intake/Output Summary (Last 24 hours) at 08/20/2022 1353 Last data filed at 08/20/2022 1200 Gross per 24 hour  Intake 240 ml  Output 2300 ml  Net -2060 ml   Filed Weights   08/18/22 1343 08/19/22 0500 08/20/22 0500  Weight: 74.9 kg 72.6 kg 71.2 kg   Physical Exam  Gen:- Awake Alert, dyspnea on exertion persist HEENT:- [place].AT, No sclera icterus Neck-Supple Neck,No JVD,.  Lungs-diminished breath sounds, no wheezing  CV- S1, S2 normal, irregular , AICD in situ Abd-  +ve B.Sounds, Abd Soft, No tenderness,    Extremity/Skin:- No  edema, pedal pulses present  Psych-affect is appropriate, oriented x3 Neuro-generalized weakness, no new focal deficits, no tremors  Data Reviewed: I have personally reviewed following labs and imaging studies  CBC: Recent Labs  Lab 08/18/22 1451  WBC 8.9  NEUTROABS 5.8  HGB 11.1*  HCT 34.7*  MCV 98.9  PLT 196   Basic Metabolic Panel: Recent Labs  Lab 08/17/22 1131 08/18/22 1451 08/19/22 0344 08/20/22 0402  NA 139 139 140 143  K 4.5 4.4 3.5 3.6  CL 96* 96* 96* 99  CO2 33* 30 33* 34*  GLUCOSE 104* 91 96 93  BUN 50* 50* 49* 43*  CREATININE 1.66* 1.86* 1.62* 1.54*  CALCIUM 9.2 9.5 9.0 9.0  MG 2.3 2.4  --  2.2   GFR: Estimated Creatinine Clearance: 22.2 mL/min (A) (by C-G formula based on SCr of 1.54 mg/dL (H)). Liver Function Tests: Recent Labs  Lab 08/18/22 1451  AST 25  ALT 12  ALKPHOS 62  BILITOT 1.5*  PROT 7.5  ALBUMIN 4.3   Radiology Studies: DG Chest 2 View  Result Date: 08/18/2022 CLINICAL DATA:  Shortness of breath. EXAM: CHEST - 2 VIEW COMPARISON:  08/10/2022 FINDINGS:  The pacer wires are stable. Stable surgical changes from coronary artery bypass surgery persistent small bilateral pleural effusions and bibasilar atelectasis. No pulmonary edema or pulmonary infiltrates. IMPRESSION: Persistent small bilateral pleural effusions and bibasilar atelectasis. No pulmonary edema. Electronically Signed   By: Rudie Meyer M.D.   On: 08/18/2022 14:22     Scheduled Meds:  amLODipine  2.5 mg Oral Daily   apixaban  2.5 mg Oral BID   atorvastatin  20 mg Oral Daily   bisoprolol  5 mg Oral Daily   dofetilide  125 mcg Oral  BID   fluticasone furoate-vilanterol  1 puff Inhalation Daily   And   umeclidinium bromide  1 puff Inhalation Daily   furosemide  60 mg Intravenous Q12H   losartan  100 mg Oral Daily   Continuous Infusions:   LOS: 2 days   Shon Hale M.D on 08/20/2022 at 1:53 PM  Go to [email or web address] - for contact info  [facility] - Office  [phone]  If 7PM-7AM, please contact night-coverage [email or web address] 08/20/2022, 1:53 PM

## 2022-08-20 NOTE — Care Management Important Message (Signed)
Important Message  Patient Details  Name: [name] [name] MRN: [id number] Date of Birth: 02-Jul-1931   Medicare Important Message Given:  N/A - LOS <3 / Initial given by admissions     Corey Harold 08/20/2022, 11:13 AM

## 2022-08-20 NOTE — Progress Notes (Signed)
Pt slept through the night. Vitals stable. No c/o pain or discomfort noted.

## 2022-08-21 DIAGNOSIS — I5033 Acute on chronic diastolic (congestive) heart failure: Secondary | ICD-10-CM | POA: Diagnosis not present

## 2022-08-21 DIAGNOSIS — I480 Paroxysmal atrial fibrillation: Secondary | ICD-10-CM | POA: Diagnosis not present

## 2022-08-21 LAB — BASIC METABOLIC PANEL
Anion gap: 11 (ref 5–15)
BUN: 38 mg/dL — ABNORMAL HIGH (ref 8–23)
CO2: 32 mmol/L (ref 22–32)
Calcium: 8.8 mg/dL — ABNORMAL LOW (ref 8.9–10.3)
Chloride: 98 mmol/L (ref 98–111)
Creatinine, Ser: 1.35 mg/dL — ABNORMAL HIGH (ref 0.44–1.00)
GFR, Estimated: 38 mL/min — ABNORMAL LOW (ref >60)
Glucose, Bld: 92 mg/dL (ref 70–99)
Potassium: 3.9 mmol/L (ref 3.5–5.1)
Sodium: 141 mmol/L (ref 135–145)

## 2022-08-21 MED ORDER — POTASSIUM CHLORIDE CRYS ER 20 MEQ PO TBCR
40.0000 meq | EXTENDED_RELEASE_TABLET | Freq: Once | ORAL | Status: AC
  Administered 2022-08-21: 40 meq via ORAL
  Filled 2022-08-21: qty 2

## 2022-08-21 NOTE — Progress Notes (Signed)
Increased oxygen to 3 liters from 2 . Patient appeared to have increased work of breathing with saturation 93 %

## 2022-08-21 NOTE — NC FL2 (Signed)
[place] MEDICAID FL2 LEVEL OF CARE FORM     IDENTIFICATION  Patient Name: [name] [name] Birthdate: 1931/11/03 Sex: female Admission Date (Current Location): 08/18/2022  [facility] and [place] Number:  [employer] and Address:  [facility],  [address] [place] [address]      Provider Number: [id number]  Attending Physician Name and Address:  Shon Hale, MD  Relative Name and Phone Number:       Current Level of Care: Hospital Recommended Level of Care: Skilled Nursing Facility Prior Approval Number:    Date Approved/Denied:   PASRR Number: [id number]  Discharge Plan: SNF    Current Diagnoses: Patient Active Problem List   Diagnosis Date Noted   Acute on chronic diastolic (congestive) heart failure (HCC) 08/18/2022   Cellulitis of right leg 07/02/2022   Chronic obstructive asthma 05/22/2022   Chronic respiratory failure with hypoxia (HCC) 05/22/2022   Exercise hypoxemia 04/28/2022   Essential (primary) hypertension    Hyperlipidemia, unspecified    Presence of cardiac pacemaker    Primary pulmonary hypertension (HCC)    Unspecified osteoarthritis, unspecified site    Anxiety disorder, unspecified    Atherosclerotic heart disease of native coronary artery without angina pectoris    Chronic atrial fibrillation (HCC)    Chronic diastolic (congestive) heart failure (HCC)    Chronic obstructive pulmonary disease, unspecified (HCC)    Headache    Heart failure, unspecified (HCC)    Unspecified fall, initial encounter    Degenerative disc disease, cervical 02/09/2018   Acute respiratory failure with hypoxia (HCC) 05/07/2017   Chronic kidney disease, stage III (moderate) (HCC) 05/07/2017   Chronic obstructive asthma with acute exacerbation (HCC) 05/05/2017   COPD exacerbation (HCC) 05/04/2017   Pacemaker complications 09/28/2013   CAP (community acquired pneumonia) 07/19/2013   DOE (dyspnea on exertion) 07/19/2013   Chronic diastolic  heart failure (HCC) 28/41/3244   Aortic stenosis 10/18/2012   Encounter for long-term (current) use of anticoagulants 10/08/2011   Mixed hyperlipidemia 08/25/2008   Essential hypertension, benign 08/25/2008   Coronary atherosclerosis of native coronary artery 08/25/2008   VENTRICULAR TACHYCARDIA 08/25/2008   Automatic implantable cardioverter-defibrillator in situ 08/25/2008    Orientation RESPIRATION BLADDER Height & Weight     Self, Time, Situation, Place  O2 (3L) Incontinent, External catheter Weight: 156 lb 1.4 oz (70.8 kg) Height:  5' (152.4 cm)  BEHAVIORAL SYMPTOMS/MOOD NEUROLOGICAL BOWEL NUTRITION STATUS      Continent Diet (Heart healthy)  AMBULATORY STATUS COMMUNICATION OF NEEDS Skin   Extensive Assist Verbally Normal                       Personal Care Assistance Level of Assistance  Bathing, Feeding, Dressing Bathing Assistance: Limited assistance Feeding assistance: Independent Dressing Assistance: Limited assistance     Functional Limitations Info  Sight, Hearing, Speech Sight Info: Adequate Hearing Info: Impaired Speech Info: Adequate    SPECIAL CARE FACTORS FREQUENCY  PT (By licensed PT), OT (By licensed OT)     PT Frequency: 5 times weekly OT Frequency: 5 times weekly            Contractures Contractures Info: Not present    Additional Factors Info  Code Status, Allergies Code Status Info: FULL Allergies Info: Codeine, Fentanyl, Keflex (Cephalexin)           Current Medications (08/21/2022):  This is the current hospital active medication list Current Facility-Administered Medications  Medication Dose Route Frequency Provider Last Rate  Last Admin   acetaminophen (TYLENOL) tablet 650 mg  650 mg Oral Q6H PRN Emokpae, Ejiroghene E, MD       Or   acetaminophen (TYLENOL) suppository 650 mg  650 mg Rectal Q6H PRN Emokpae, Ejiroghene E, MD       albuterol (VENTOLIN HFA) 108 (90 Base) MCG/ACT inhaler 2 puff  2 puff Inhalation Q2H PRN Emokpae,  Ejiroghene E, MD       amLODipine (NORVASC) tablet 2.5 mg  2.5 mg Oral Daily Emokpae, Ejiroghene E, MD   2.5 mg at 08/21/22 0947   apixaban (ELIQUIS) tablet 2.5 mg  2.5 mg Oral BID Emokpae, Ejiroghene E, MD   2.5 mg at 08/21/22 0947   atorvastatin (LIPITOR) tablet 20 mg  20 mg Oral Daily Emokpae, Ejiroghene E, MD   20 mg at 08/21/22 0947   bisoprolol (ZEBETA) tablet 5 mg  5 mg Oral Daily Emokpae, Ejiroghene E, MD   5 mg at 08/21/22 0947   dofetilide (TIKOSYN) capsule 125 mcg  125 mcg Oral BID Emokpae, Ejiroghene E, MD   125 mcg at 08/21/22 0948   fluticasone furoate-vilanterol (BREO ELLIPTA) 100-25 MCG/ACT 1 puff  1 puff Inhalation Daily Emokpae, Ejiroghene E, MD   1 puff at 08/21/22 0904   And   umeclidinium bromide (INCRUSE ELLIPTA) 62.5 MCG/ACT 1 puff  1 puff Inhalation Daily Emokpae, Ejiroghene E, MD   1 puff at 08/21/22 0904   furosemide (LASIX) injection 60 mg  60 mg Intravenous Q12H Emokpae, Ejiroghene E, MD   60 mg at 08/21/22 0947   losartan (COZAAR) tablet 100 mg  100 mg Oral Daily Emokpae, Ejiroghene E, MD   100 mg at 08/21/22 0947   ondansetron (ZOFRAN) tablet 4 mg  4 mg Oral Q6H PRN Emokpae, Ejiroghene E, MD       Or   ondansetron (ZOFRAN) injection 4 mg  4 mg Intravenous Q6H PRN Emokpae, Ejiroghene E, MD       polyethylene glycol (MIRALAX / GLYCOLAX) packet 17 g  17 g Oral Daily PRN Emokpae, Ejiroghene E, MD         Discharge Medications: Please see discharge summary for a list of discharge medications.  Relevant Imaging Results:  Relevant Lab Results:   Additional Information SSN: [id number], [place]

## 2022-08-21 NOTE — Progress Notes (Signed)
Pt slept through the night. DBP low. No c/o pain.

## 2022-08-21 NOTE — Progress Notes (Signed)
PROGRESS NOTE     [name] [name], is a 89 y.o. female, DOB - 08-25-1931, ZOX:[id number]  Admit date - 08/18/2022   Admitting Physician [name], MD  Outpatient Primary MD for the patient is [name], MD  LOS - 3  CC---shob       Brief Narrative:  89 y.o. female with medical history significant for  Diastolic CHF, CKD 3b-4, CABG- 8119, Atria fibrillation on anti-coag, chronic respiratory failure on 3 L admitted with acute diastolic CHF exacerbation on 08/18/2022    -Assessment and Plan: 1)Acute on chronic diastolic (congestive) heart failure (HCC) -Admit to with weight gain, no extremity edema, elevated BNP, chest x-ray findings of pleural effusions and worsening shortness of breath after failing oral diuretics at home  -Recent echo-EF of 50 to 55%, RV pressure 59.1.   -PTA patient was on torsemide daily (alternating between 60 and 80 mg every daily), and metolazone 2.5 mg   -Cardiology consult appreciated 08/21/22 -Appears to be diuresing well---fluid balance remains negative, weight is trending down but not back to baseline --Experiencing significant dyspnea on exertion -Cardiology recommends continue IV Lasix for at least another days  -c/n IV Lasix 60 mg every 12hr -Pt requires additional IV diuretics, she failed oral diuretics prior to admission)   2)Chronic obstructive pulmonary disease, unspecified (HCC) -Currently requiring 3 L per nasal cannula  -at baseline uses 3 L per nasal cannula 08/21/22 -Experiencing significant dyspnea on exertion  3)Chronic atrial fibrillation/history of V. tach -status post AICD placement Stable.  -Continue Tikosyn, bisoprolol, Eliquis -Follow-up with Dr. Ladona Ridgel as outpatient  4)CKD stage -3B -monitor renal function closely with diuresis---creatinine trending down slowly with diuresis -renally adjust medications, avoid nephrotoxic agents / dehydration  / hypotension  5)HTN- Stable. C/n  bisoprolol 10 mg, 100mg  losartan, 2.5 mg  Norvasc  6)CAD--- chest pain-free -s/p CABG in 1998 with LIMA-LAD, SVG-Diagonal, SVG-RI-OM, DES to PLA in 2006 and she did have a low risk NST in 09/2019.  Continue atorvastatin 20 mg daily and  Bisoprolol -No aspirin as patient is already on Eliquis  7)Generalized weakness and Deconditioning----PT eval appreciated -Anticipate discharge home with home health services  Status is: Inpatient   Disposition: The patient is from: Home              Anticipated d/c is to: Home with home health services in 1 to 2 days              Anticipated d/c date is: 2 days              Patient currently is not medically stable to d/c. Barriers: Not Clinically Stable-   Code Status :  -  Code Status: Full Code   Family Communication:    NA (patient is alert, awake and coherent)  Discussed with son at bedside  DVT Prophylaxis  :   - SCDs  apixaban (ELIQUIS) tablet 2.5 mg Start: 08/18/22 2200 apixaban (ELIQUIS) tablet 2.5 mg   Lab Results  Component Value Date   PLT 196 08/18/2022   Inpatient Medications  Scheduled Meds:  amLODipine  2.5 mg Oral Daily   apixaban  2.5 mg Oral BID   atorvastatin  20 mg Oral Daily   bisoprolol  5 mg Oral Daily   dofetilide  125 mcg Oral BID   fluticasone furoate-vilanterol  1 puff Inhalation Daily   And   umeclidinium bromide  1 puff Inhalation Daily   furosemide  60 mg Intravenous Q12H   losartan  100  mg Oral Daily   Continuous Infusions: PRN Meds:.acetaminophen **OR** acetaminophen, albuterol, ondansetron **OR** ondansetron (ZOFRAN) IV, polyethylene glycol   Anti-infectives (From admission, onward)    Start     Dose/Rate Route Frequency Ordered Stop   08/18/22 1800  vancomycin (VANCOCIN) capsule 500 mg  Status:  Discontinued        500 mg Oral Every 6 hours 08/18/22 1510 08/18/22 1510   08/18/22 1515  metroNIDAZOLE (FLAGYL) IVPB 500 mg  Status:  Discontinued        500 mg 100 mL/hr over 60 Minutes Intravenous Every 8 hours 08/18/22 1510 08/18/22 1510       Subjective: [name] today has no fevers, no emesis,  No chest pain,   - Son ([name])  at bedside, questions answered -Dyspnea on exertion  persist.,..... significant fatigue -Oxygen requirement is getting close to baseline -Experiencing significant dyspnea on exertion   Objective: Vitals:   08/20/22 1949 08/21/22 0518 08/21/22 0904 08/21/22 1658  BP: (!) 115/57 (!) 118/54  (!) 105/48  Pulse: 62 60  64  Resp: 20 (!) 22  (!) 21  Temp: 98.4 F (36.9 C) 98.2 F (36.8 C)  98.7 F (37.1 C)  TempSrc: Oral     SpO2: 98% 98% 91% 96%  Weight:  70.8 kg    Height:        Intake/Output Summary (Last 24 hours) at 08/21/2022 2001 Last data filed at 08/21/2022 0900 Gross per 24 hour  Intake 600 ml  Output 1600 ml  Net -1000 ml   Filed Weights   08/19/22 0500 08/20/22 0500 08/21/22 0518  Weight: 72.6 kg 71.2 kg 70.8 kg   Physical Exam  Gen:- Awake Alert, dyspnea on exertion persist HEENT:- [name].AT, No sclera icterus Nose- [name] 3L/min Neck-Supple Neck,No JVD,.  Lungs-diminished breath sounds, no wheezing  CV- S1, S2 normal, irregular , AICD in situ Abd-  +ve B.Sounds, Abd Soft, No tenderness,    Extremity/Skin:-Improved edema, pedal pulses present  Psych-affect is appropriate, oriented x3 Neuro-generalized weakness, no new focal deficits, no tremors  Data Reviewed: I have personally reviewed following labs and imaging studies  CBC: Recent Labs  Lab 08/18/22 1451  WBC 8.9  NEUTROABS 5.8  HGB 11.1*  HCT 34.7*  MCV 98.9  PLT 196   Basic Metabolic Panel: Recent Labs  Lab 08/17/22 1131 08/18/22 1451 08/19/22 0344 08/20/22 0402 08/21/22 0402  NA 139 139 140 143 141  K 4.5 4.4 3.5 3.6 3.9  CL 96* 96* 96* 99 98  CO2 33* 30 33* 34* 32  GLUCOSE 104* 91 96 93 92  BUN 50* 50* 49* 43* 38*  CREATININE 1.66* 1.86* 1.62* 1.54* 1.35*  CALCIUM 9.2 9.5 9.0 9.0 8.8*  MG 2.3 2.4  --  2.2  --    GFR: Estimated Creatinine Clearance: 25.3 mL/min (A) (by C-G formula based on  SCr of 1.35 mg/dL (H)). Liver Function Tests: Recent Labs  Lab 08/18/22 1451  AST 25  ALT 12  ALKPHOS 62  BILITOT 1.5*  PROT 7.5  ALBUMIN 4.3   Radiology Studies: No results found.   Scheduled Meds:  amLODipine  2.5 mg Oral Daily   apixaban  2.5 mg Oral BID   atorvastatin  20 mg Oral Daily   bisoprolol  5 mg Oral Daily   dofetilide  125 mcg Oral BID   fluticasone furoate-vilanterol  1 puff Inhalation Daily   And   umeclidinium bromide  1 puff Inhalation Daily   furosemide  60 mg  Intravenous Q12H   losartan  100 mg Oral Daily   Continuous Infusions:   LOS: 3 days   Shon Hale M.D on 08/21/2022 at 8:01 PM  Go to [email or web address] - for contact info  [facility] - Office  [phone]  If 7PM-7AM, please contact night-coverage [email or web address] 08/21/2022, 8:01 PM

## 2022-08-21 NOTE — TOC Progression Note (Addendum)
Transition of Care [facility]) - Progression Note    Patient Details  Name: [name] [name] MRN: [id number] Date of Birth: [birth year]  Transition of [facility]) CM/SW Contact  Elliot Gault, LCSW Phone Number: 08/21/2022, 3:05 PM  Clinical Narrative:     TOC following. Met with pt to review dc planning. Pt now requesting SNF referral. CMS provider options reviewed with pt and with her son. Will refer as requested and start insurance auth. Anticipating dc tomorrow if bed and auth in place.  1530: [facility] made bed offer. Reviewed with pt/son and they will accept. [facility] can accept over the weekend if insurance auth in place. Weekend TOC will follow.  Expected Discharge Plan: Skilled Nursing Facility Barriers to Discharge: Continued Medical Work up  Expected Discharge Plan and Services In-house Referral: Clinical Social Work   Post Acute Care Choice: Skilled Nursing Facility Living arrangements for the past 2 months: Single Family Home                                       Social Determinants of Health (SDOH) Interventions SDOH Screenings   Food Insecurity: No Food Insecurity (08/18/2022)  Housing: Low Risk  (08/18/2022)  Transportation Needs: No Transportation Needs (08/18/2022)  Utilities: Not At Risk (08/18/2022)  Tobacco Use: Low Risk  (08/18/2022)    Readmission Risk Interventions     No data to display

## 2022-08-21 NOTE — Progress Notes (Signed)
Rounding Note    Patient Name: [name] [name] Date of Encounter: 08/21/2022   [facility] Cardiologist: Nona Dell, MD   Subjective   SOB improved  Inpatient Medications    Scheduled Meds:  amLODipine  2.5 mg Oral Daily   apixaban  2.5 mg Oral BID   atorvastatin  20 mg Oral Daily   bisoprolol  5 mg Oral Daily   dofetilide  125 mcg Oral BID   fluticasone furoate-vilanterol  1 puff Inhalation Daily   And   umeclidinium bromide  1 puff Inhalation Daily   furosemide  60 mg Intravenous Q12H   losartan  100 mg Oral Daily   potassium chloride  40 mEq Oral Once   Continuous Infusions:  PRN Meds: acetaminophen **OR** acetaminophen, albuterol, ondansetron **OR** ondansetron (ZOFRAN) IV, polyethylene glycol   Vital Signs    Vitals:   08/20/22 0815 08/20/22 1353 08/20/22 1949 08/21/22 0518  BP: (!) 116/41 118/63 (!) 115/57 (!) 118/54  Pulse: 60 61 62 60  Resp:  (!) 22 20 (!) 22  Temp:  98.1 F (36.7 C) 98.4 F (36.9 C) 98.2 F (36.8 C)  TempSrc:  Oral Oral   SpO2: 97% 99% 98% 98%  Weight:    70.8 kg  Height:        Intake/Output Summary (Last 24 hours) at 08/21/2022 0912 Last data filed at 08/21/2022 0900 Gross per 24 hour  Intake 1080 ml  Output 2050 ml  Net -970 ml      08/21/2022    5:18 AM 08/20/2022    5:00 AM 08/19/2022    5:00 AM  Last 3 Weights  Weight (lbs) 156 lb 1.4 oz 156 lb 15.5 oz 160 lb 0.9 oz  Weight (kg) 70.8 kg 71.2 kg 72.6 kg      Telemetry    NSR - Personally Reviewed  ECG    N/a - Personally Reviewed  Physical Exam   GEN: No acute distress.   Neck: elevated JVD Cardiac: RRR, no murmurs, rubs, or gallops.  Respiratory: mild crackles bilateral bases GI: Soft, nontender, non-distended  MS: No edema; No deformity. Neuro:  Nonfocal  Psych: Normal affect   Labs    High Sensitivity Troponin:  No results for input(s): "TROPONINIHS" in the last 720 hours.   Chemistry Recent Labs  Lab 08/17/22 1131 08/18/22 1451  08/19/22 0344 08/20/22 0402 08/21/22 0402  NA 139 139 140 143 141  K 4.5 4.4 3.5 3.6 3.9  CL 96* 96* 96* 99 98  CO2 33* 30 33* 34* 32  GLUCOSE 104* 91 96 93 92  BUN 50* 50* 49* 43* 38*  CREATININE 1.66* 1.86* 1.62* 1.54* 1.35*  CALCIUM 9.2 9.5 9.0 9.0 8.8*  MG 2.3 2.4  --  2.2  --   PROT  --  7.5  --   --   --   ALBUMIN  --  4.3  --   --   --   AST  --  25  --   --   --   ALT  --  12  --   --   --   ALKPHOS  --  62  --   --   --   BILITOT  --  1.5*  --   --   --   GFRNONAA 29* 26* 30* 32* 38*  ANIONGAP 10 13 11 10 11     Lipids No results for input(s): "CHOL", "TRIG", "HDL", "LABVLDL", "LDLCALC", "CHOLHDL" in the last 168 hours.  Hematology Recent Labs  Lab 08/18/22 1451  WBC 8.9  RBC 3.51*  HGB 11.1*  HCT 34.7*  MCV 98.9  MCH 31.6  MCHC 32.0  RDW 15.8*  PLT 196   Thyroid No results for input(s): "TSH", "FREET4" in the last 168 hours.  BNP Recent Labs  Lab 08/18/22 1451  BNP 623.0*    DDimer No results for input(s): "DDIMER" in the last 168 hours.   Radiology    No results found.  Cardiac Studies    Patient Profile     89 y.o. female w/ PMH of CAD (s/p CABG in 1998 with LIMA-LAD, SVG-Diagonal, SVG-RI-OM, DES to PLA in 2006, low risk NST in 09/2019), HFpEF (EF 50-55% by echo in 06/2022 with RV function being moderately reduced and PASP elevated to 59.1 mmHg), chronic hypoxic respiratory failure, chronic obstructive asthma, paroxysmal atrial fibrillation, history of VT (s/p ICD placement), HTN, HLD and Stage 3 CKD who is currently admitted for an acute CHF exacerbation.   Assessment & Plan    1.Acute on chronic HFpEF - 06/2022 echo: LVEF 50-55%, indet diastolic fxn, mod RV dysfunction, mod RVE, mod pulm HTN PASP 59, severe BAE, mild to mod MR, dilated fixed IVC - CXR bilateral small effusions, BNP 623   - negative 1 L yesterday, negative roughly 5.4 L since admission though some incomplete documentation.  She is on IV lasix 60mg  bid, consistent downtrend in  Cr with diuresis consistent with venous congestion and HF - still with some signs of fluid overload, continue IV lasix today.  - likely transition to oral diuretic tomorrow. Had been on torsemide 80mg  alternating days with 60mg . Would discharge on torsemide 80mg  daily, can take additional 40mg  in evening for weight gain of 3 pounds or more. - GFR is borderline 26-30 for SGLT2i, have not started this admission.     2.PAF -on tikasyn, bisoprolol, eliquis reduced dose based on age and renal function - AV paced on tele normal rates.    3. Chronic respiratory failure - on 3L [place] at home  Likely discharge tomorrow.   For questions or updates, please contact [facility] Please consult [email or web address] for contact info under        Signed, Dina Rich, MD  08/21/2022, 9:12 AM

## 2022-08-21 NOTE — Care Management Important Message (Signed)
Important Message  Patient Details  Name: [name] [name] MRN: [id number] Date of Birth: November 10, 1931   Medicare Important Message Given:  Yes     Corey Harold 08/21/2022, 11:40 AM

## 2022-08-22 DIAGNOSIS — I5033 Acute on chronic diastolic (congestive) heart failure: Secondary | ICD-10-CM | POA: Diagnosis not present

## 2022-08-22 LAB — BASIC METABOLIC PANEL
Anion gap: 9 (ref 5–15)
BUN: 40 mg/dL — ABNORMAL HIGH (ref 8–23)
CO2: 34 mmol/L — ABNORMAL HIGH (ref 22–32)
Calcium: 8.9 mg/dL (ref 8.9–10.3)
Chloride: 98 mmol/L (ref 98–111)
Creatinine, Ser: 1.42 mg/dL — ABNORMAL HIGH (ref 0.44–1.00)
GFR, Estimated: 36 mL/min — ABNORMAL LOW (ref >60)
Glucose, Bld: 98 mg/dL (ref 70–99)
Potassium: 4 mmol/L (ref 3.5–5.1)
Sodium: 141 mmol/L (ref 135–145)

## 2022-08-22 LAB — MAGNESIUM: Magnesium: 2.4 mg/dL (ref 1.7–2.4)

## 2022-08-22 MED ORDER — FUROSEMIDE 10 MG/ML IJ SOLN
40.0000 mg | Freq: Every evening | INTRAMUSCULAR | Status: AC
  Administered 2022-08-22: 40 mg via INTRAVENOUS
  Filled 2022-08-22: qty 4

## 2022-08-22 MED ORDER — TORSEMIDE 20 MG PO TABS
80.0000 mg | ORAL_TABLET | Freq: Every day | ORAL | Status: DC
  Administered 2022-08-23: 80 mg via ORAL
  Filled 2022-08-22: qty 4

## 2022-08-22 MED ORDER — ALBUTEROL SULFATE (2.5 MG/3ML) 0.083% IN NEBU: 2.5000 mg | INHALATION_SOLUTION | RESPIRATORY_TRACT | Status: DC | PRN

## 2022-08-22 MED ORDER — ALBUTEROL SULFATE (2.5 MG/3ML) 0.083% IN NEBU
INHALATION_SOLUTION | RESPIRATORY_TRACT | Status: AC
  Administered 2022-08-22: 2.5 mg via RESPIRATORY_TRACT
  Filled 2022-08-22: qty 3

## 2022-08-22 NOTE — TOC Progression Note (Signed)
Transition of Care [facility]) - Progression Note    Patient Details  Name: [name] [name] MRN: [id number] Date of Birth: Feb 14, 1932  [facility] of [facility] CM/SW Contact  Catalina Gravel, LCSW Phone Number: 08/22/2022, 1:47 PM  Clinical Narrative:    CSW met with pt and [name] at bedside. Accepted bed at [facility]. CSW initially advised DC may be as soon as today, based on progression. CSW followed up with [facility] and DR. Dr. Asked could pt be accepted on Sunday. CSW verified facility can accept on Sunday and secure chatted MD.CSW followed up with son advising pt may not dc today but bed ready and approved.  TOC to follow.    Expected Discharge Plan: Skilled Nursing Facility Barriers to Discharge: Continued Medical Work up  Expected Discharge Plan and Services In-house Referral: Clinical Social Work   Post Acute Care Choice: Skilled Nursing Facility Living arrangements for the past 2 months: Single Family Home                                       Social Determinants of Health (SDOH) Interventions SDOH Screenings   Food Insecurity: No Food Insecurity (08/18/2022)  Housing: Low Risk  (08/18/2022)  Transportation Needs: No Transportation Needs (08/18/2022)  Utilities: Not At Risk (08/18/2022)  Tobacco Use: Low Risk  (08/18/2022)    Readmission Risk Interventions     No data to display

## 2022-08-22 NOTE — Progress Notes (Addendum)
PROGRESS NOTE     [name] [name], is a 89 y.o. female, DOB - 1931/05/06, ZOX:[id number]  Admit date - 08/18/2022   Admitting Physician [name], MD  Outpatient Primary MD for the patient is [name], MD  LOS - 4  CC---shob       Brief Narrative:  89 y.o. female with medical history significant for  Diastolic CHF, CKD 3b-4, CABG- 8119, Atria fibrillation on anti-coag, chronic respiratory failure on 3 L admitted with acute diastolic CHF exacerbation on 08/18/2022    -Assessment and Plan: 1)Acute on chronic diastolic (congestive) heart failure (HCC) -Admit to with weight gain, no extremity edema, elevated BNP, chest x-ray findings of pleural effusions and worsening shortness of breath after failing oral diuretics at home  -Recent echo-EF of 50 to 55%, RV pressure 59.1.   -PTA patient was on torsemide daily (alternating between 60 and 80 mg every daily), and metolazone 2.5 mg   -Cardiology consult appreciated 08/22/22 -Some conversational dyspnea -Noticeable dyspnea with minimal exertion  Currently on IV Lasix -Repeat chest x-ray on 08/23/2022 -Voiding well, weight is trending down and fluid balance remains negative -Anticipate possible transition to oral Demadex in 1 to 2 days -She failed oral diuretics prior to admission   2)Chronic obstructive pulmonary disease, unspecified (HCC) -Currently requiring 3 L per nasal cannula  -at baseline uses 3 L per nasal cannula 08/22/22 -Some conversational dyspnea -Noticeable dyspnea with minimal exertion  3)Chronic atrial fibrillation/history of V. tach -status post AICD placement Stable.  -Continue Tikosyn, bisoprolol, Eliquis -Follow-up with Dr. Ladona Ridgel as outpatient  4)CKD stage -3B -monitor renal function closely with diuresis---creatinine trending down slowly with diuresis -renally adjust medications, avoid nephrotoxic agents / dehydration  / hypotension  5)HTN- Stable. C/n  bisoprolol 10 mg, 100mg  losartan, 2.5 mg  Norvasc -Currently on IV Lasix will transition to oral Demadex on 08/23/2022  6)CAD--- chest pain-free -s/p CABG in 1998 with LIMA-LAD, SVG-Diagonal, SVG-RI-OM, DES to PLA in 2006 and she did have a low risk NST in 09/2019.  Continue atorvastatin 20 mg daily and  Bisoprolol -No aspirin as patient is already on Eliquis  7)Generalized weakness and Deconditioning----PT eval appreciated -Anticipate discharge to SNF facility in 1 to 2 days for rehab  Status is: Inpatient   Disposition: The patient is from: Home              Anticipated d/c is to: SNF with home health services in 1 to 2 days              Anticipated d/c date is: 1 day              Patient currently is not medically stable to d/c. Barriers: Not Clinically Stable-   Code Status :  -  Code Status: Full Code   Family Communication:    NA (patient is alert, awake and coherent)  Discussed with son at bedside  DVT Prophylaxis  :   - SCDs  apixaban (ELIQUIS) tablet 2.5 mg Start: 08/18/22 2200 apixaban (ELIQUIS) tablet 2.5 mg   Lab Results  Component Value Date   PLT 196 08/18/2022   Inpatient Medications  Scheduled Meds:  amLODipine  2.5 mg Oral Daily   apixaban  2.5 mg Oral BID   atorvastatin  20 mg Oral Daily   bisoprolol  5 mg Oral Daily   dofetilide  125 mcg Oral BID   fluticasone furoate-vilanterol  1 puff Inhalation Daily   And   umeclidinium bromide  1 puff Inhalation  Daily   furosemide  40 mg Intravenous QPM   losartan  100 mg Oral Daily   [START ON 08/23/2022] torsemide  80 mg Oral Daily   Continuous Infusions: PRN Meds:.acetaminophen **OR** acetaminophen, albuterol, albuterol, ondansetron **OR** ondansetron (ZOFRAN) IV, polyethylene glycol   Anti-infectives (From admission, onward)    Start     Dose/Rate Route Frequency Ordered Stop   08/18/22 1800  vancomycin (VANCOCIN) capsule 500 mg  Status:  Discontinued        500 mg Oral Every 6 hours 08/18/22 1510 08/18/22 1510   08/18/22 1515  metroNIDAZOLE  (FLAGYL) IVPB 500 mg  Status:  Discontinued        500 mg 100 mL/hr over 60 Minutes Intravenous Every 8 hours 08/18/22 1510 08/18/22 1510      Subjective: [name] today has no fevers, no emesis,  No chest pain,   - -Somewhat anxious but refusing antianxiety medication -Some conversational dyspnea -Noticeable dyspnea with minimal exertion -Voiding well -  Objective: Vitals:   08/21/22 2019 08/22/22 0532 08/22/22 0928 08/22/22 1246  BP: (!) 114/58 (!) 144/60  (!) 125/47  Pulse: (!) 59 79  63  Resp: 20 20  16   Temp: 98.8 F (37.1 C) 98.2 F (36.8 C)  98 F (36.7 C)  TempSrc: Oral     SpO2: 97% 95% 90% 96%  Weight:  70.1 kg    Height:        Intake/Output Summary (Last 24 hours) at 08/22/2022 1432 Last data filed at 08/22/2022 1300 Gross per 24 hour  Intake 480 ml  Output 1500 ml  Net -1020 ml   Filed Weights   08/20/22 0500 08/21/22 0518 08/22/22 0532  Weight: 71.2 kg 70.8 kg 70.1 kg   Physical Exam  Gen:- Awake Alert, dyspnea on exertion persist HEENT:- [place].AT, No sclera icterus Nose- [place] 3L/min Neck-Supple Neck, improving JVD,.  Lungs-diminished breath sounds, no wheezing  CV- S1, S2 normal, irregular , AICD in situ Abd-  +ve B.Sounds, Abd Soft, No tenderness,    Extremity/Skin:-Improved edema, chronic venous status noted,  pedal pulses present  Psych-affect is appropriate, oriented x3 Neuro-generalized weakness, no new focal deficits, no tremors  Data Reviewed: I have personally reviewed following labs and imaging studies  CBC: Recent Labs  Lab 08/18/22 1451  WBC 8.9  NEUTROABS 5.8  HGB 11.1*  HCT 34.7*  MCV 98.9  PLT 196   Basic Metabolic Panel: Recent Labs  Lab 08/17/22 1131 08/18/22 1451 08/19/22 0344 08/20/22 0402 08/21/22 0402 08/22/22 0512  NA 139 139 140 143 141 141  K 4.5 4.4 3.5 3.6 3.9 4.0  CL 96* 96* 96* 99 98 98  CO2 33* 30 33* 34* 32 34*  GLUCOSE 104* 91 96 93 92 98  BUN 50* 50* 49* 43* 38* 40*  CREATININE 1.66* 1.86* 1.62*  1.54* 1.35* 1.42*  CALCIUM 9.2 9.5 9.0 9.0 8.8* 8.9  MG 2.3 2.4  --  2.2  --  2.4   GFR: Estimated Creatinine Clearance: 23.9 mL/min (A) (by C-G formula based on SCr of 1.42 mg/dL (H)). Liver Function Tests: Recent Labs  Lab 08/18/22 1451  AST 25  ALT 12  ALKPHOS 62  BILITOT 1.5*  PROT 7.5  ALBUMIN 4.3   Scheduled Meds:  amLODipine  2.5 mg Oral Daily   apixaban  2.5 mg Oral BID   atorvastatin  20 mg Oral Daily   bisoprolol  5 mg Oral Daily   dofetilide  125 mcg Oral BID  fluticasone furoate-vilanterol  1 puff Inhalation Daily   And   umeclidinium bromide  1 puff Inhalation Daily   furosemide  40 mg Intravenous QPM   losartan  100 mg Oral Daily   [START ON 08/23/2022] torsemide  80 mg Oral Daily   Continuous Infusions:   LOS: 4 days   Shon Hale M.D on 08/22/2022 at 2:32 PM  Go to [email or web address] - for contact info  [facility] - Office  [phone]  If 7PM-7AM, please contact night-coverage [email or web address] 08/22/2022, 2:32 PM

## 2022-08-22 NOTE — Progress Notes (Signed)
Patient experienced DOE and increased WOB post use of bedside commode.  Her Sp02 also dropped to 85% while on 3l L/m [place] during this episode . PRN Albuterol nebulizer given followed by DPIs.

## 2022-08-22 NOTE — Progress Notes (Signed)
BP elevated. Pt slept through the night. No c/o pain.

## 2022-08-22 NOTE — Progress Notes (Signed)
Attempted to administer DPI's but patient unavailable due to the following: 0835 patient eating [phone] consult with doctor [phone] toileting Medications to be given when patient is available.

## 2022-08-23 ENCOUNTER — Inpatient Hospital Stay (HOSPITAL_COMMUNITY): Payer: Medicare PPO

## 2022-08-23 DIAGNOSIS — Z79899 Other long term (current) drug therapy: Secondary | ICD-10-CM | POA: Diagnosis not present

## 2022-08-23 DIAGNOSIS — Z7901 Long term (current) use of anticoagulants: Secondary | ICD-10-CM | POA: Diagnosis not present

## 2022-08-23 DIAGNOSIS — I130 Hypertensive heart and chronic kidney disease with heart failure and stage 1 through stage 4 chronic kidney disease, or unspecified chronic kidney disease: Secondary | ICD-10-CM | POA: Diagnosis not present

## 2022-08-23 DIAGNOSIS — N1832 Chronic kidney disease, stage 3b: Secondary | ICD-10-CM | POA: Diagnosis not present

## 2022-08-23 DIAGNOSIS — J209 Acute bronchitis, unspecified: Secondary | ICD-10-CM | POA: Diagnosis not present

## 2022-08-23 DIAGNOSIS — Z951 Presence of aortocoronary bypass graft: Secondary | ICD-10-CM | POA: Diagnosis not present

## 2022-08-23 DIAGNOSIS — Z955 Presence of coronary angioplasty implant and graft: Secondary | ICD-10-CM | POA: Diagnosis not present

## 2022-08-23 DIAGNOSIS — D631 Anemia in chronic kidney disease: Secondary | ICD-10-CM | POA: Diagnosis present

## 2022-08-23 DIAGNOSIS — J9611 Chronic respiratory failure with hypoxia: Secondary | ICD-10-CM | POA: Diagnosis not present

## 2022-08-23 DIAGNOSIS — I480 Paroxysmal atrial fibrillation: Secondary | ICD-10-CM | POA: Diagnosis not present

## 2022-08-23 DIAGNOSIS — I083 Combined rheumatic disorders of mitral, aortic and tricuspid valves: Secondary | ICD-10-CM | POA: Diagnosis not present

## 2022-08-23 DIAGNOSIS — R0602 Shortness of breath: Secondary | ICD-10-CM | POA: Diagnosis not present

## 2022-08-23 DIAGNOSIS — I4892 Unspecified atrial flutter: Secondary | ICD-10-CM | POA: Diagnosis not present

## 2022-08-23 DIAGNOSIS — R0609 Other forms of dyspnea: Secondary | ICD-10-CM | POA: Diagnosis not present

## 2022-08-23 DIAGNOSIS — J9811 Atelectasis: Secondary | ICD-10-CM | POA: Diagnosis not present

## 2022-08-23 DIAGNOSIS — M6281 Muscle weakness (generalized): Secondary | ICD-10-CM | POA: Diagnosis not present

## 2022-08-23 DIAGNOSIS — N183 Chronic kidney disease, stage 3 unspecified: Secondary | ICD-10-CM | POA: Diagnosis not present

## 2022-08-23 DIAGNOSIS — I272 Pulmonary hypertension, unspecified: Secondary | ICD-10-CM | POA: Diagnosis not present

## 2022-08-23 DIAGNOSIS — I2510 Atherosclerotic heart disease of native coronary artery without angina pectoris: Secondary | ICD-10-CM | POA: Diagnosis not present

## 2022-08-23 DIAGNOSIS — I482 Chronic atrial fibrillation, unspecified: Secondary | ICD-10-CM | POA: Diagnosis not present

## 2022-08-23 DIAGNOSIS — J90 Pleural effusion, not elsewhere classified: Secondary | ICD-10-CM | POA: Diagnosis not present

## 2022-08-23 DIAGNOSIS — I5033 Acute on chronic diastolic (congestive) heart failure: Secondary | ICD-10-CM | POA: Diagnosis not present

## 2022-08-23 DIAGNOSIS — R262 Difficulty in walking, not elsewhere classified: Secondary | ICD-10-CM | POA: Diagnosis not present

## 2022-08-23 DIAGNOSIS — I10 Essential (primary) hypertension: Secondary | ICD-10-CM | POA: Diagnosis not present

## 2022-08-23 DIAGNOSIS — I25119 Atherosclerotic heart disease of native coronary artery with unspecified angina pectoris: Secondary | ICD-10-CM | POA: Diagnosis not present

## 2022-08-23 DIAGNOSIS — Z95810 Presence of automatic (implantable) cardiac defibrillator: Secondary | ICD-10-CM | POA: Diagnosis not present

## 2022-08-23 DIAGNOSIS — I4729 Other ventricular tachycardia: Secondary | ICD-10-CM | POA: Diagnosis not present

## 2022-08-23 DIAGNOSIS — I5032 Chronic diastolic (congestive) heart failure: Secondary | ICD-10-CM | POA: Diagnosis not present

## 2022-08-23 DIAGNOSIS — E785 Hyperlipidemia, unspecified: Secondary | ICD-10-CM | POA: Diagnosis not present

## 2022-08-23 MED ORDER — TRELEGY ELLIPTA 100-62.5-25 MCG/ACT IN AEPB: 1.0000 | INHALATION_SPRAY | Freq: Every day | RESPIRATORY_TRACT | 6 refills | Status: DC

## 2022-08-23 MED ORDER — FOLIC ACID 1 MG PO TABS: 1.0000 mg | ORAL_TABLET | Freq: Every day | ORAL | 3 refills | Status: DC

## 2022-08-23 MED ORDER — DOXYCYCLINE HYCLATE 100 MG PO TABS: 100.0000 mg | ORAL_TABLET | Freq: Two times a day (BID) | ORAL | 0 refills | Status: AC

## 2022-08-23 MED ORDER — ACETAMINOPHEN 325 MG PO TABS: 650.0000 mg | ORAL_TABLET | Freq: Four times a day (QID) | ORAL | 2 refills | Status: DC | PRN

## 2022-08-23 MED ORDER — ALBUTEROL SULFATE HFA 108 (90 BASE) MCG/ACT IN AERS: 2.0000 | INHALATION_SPRAY | Freq: Four times a day (QID) | RESPIRATORY_TRACT | 2 refills | Status: DC | PRN

## 2022-08-23 MED ORDER — METOLAZONE 2.5 MG PO TABS: 2.5000 mg | ORAL_TABLET | ORAL | 0 refills | Status: DC

## 2022-08-23 MED ORDER — ATORVASTATIN CALCIUM 20 MG PO TABS: 20.0000 mg | ORAL_TABLET | Freq: Every day | ORAL | 3 refills | Status: DC

## 2022-08-23 MED ORDER — LOSARTAN POTASSIUM 100 MG PO TABS: 100.0000 mg | ORAL_TABLET | Freq: Every day | ORAL | 4 refills | Status: DC

## 2022-08-23 MED ORDER — DOFETILIDE 125 MCG PO CAPS: 125.0000 ug | ORAL_CAPSULE | Freq: Two times a day (BID) | ORAL | 2 refills | Status: DC

## 2022-08-23 MED ORDER — BISOPROLOL FUMARATE 5 MG PO TABS: 5.0000 mg | ORAL_TABLET | Freq: Every day | ORAL | 4 refills | Status: DC

## 2022-08-23 MED ORDER — AMLODIPINE BESYLATE 2.5 MG PO TABS: 2.5000 mg | ORAL_TABLET | Freq: Every day | ORAL | 3 refills | Status: DC

## 2022-08-23 MED ORDER — POLYETHYLENE GLYCOL 3350 17 G PO PACK: 17.0000 g | PACK | Freq: Every day | ORAL | 0 refills | Status: DC | PRN

## 2022-08-23 MED ORDER — POTASSIUM CHLORIDE CRYS ER 20 MEQ PO TBCR: 20.0000 meq | EXTENDED_RELEASE_TABLET | ORAL | 0 refills | Status: DC

## 2022-08-23 MED ORDER — APIXABAN 2.5 MG PO TABS: 2.5000 mg | ORAL_TABLET | Freq: Two times a day (BID) | ORAL | 4 refills | Status: DC

## 2022-08-23 MED ORDER — TORSEMIDE 40 MG PO TABS: 80.0000 mg | ORAL_TABLET | Freq: Every morning | ORAL | 3 refills | Status: DC

## 2022-08-23 NOTE — Discharge Summary (Signed)
[name] [name], is a 89 y.o. female  DOB [birth year]  MRN [id number].  Admission date:  08/18/2022  Admitting Physician  [name], MD  Discharge Date:  08/23/2022   Primary MD  [name], MD  Recommendations for primary care physician for things to follow:   1)Please change Torsemide/Demadex to 80 mg every Morning----May Take additional 40 mg in the evening if you gain 3Lb or more 2)You are taking apixaban/Eliquis which is a blood thinner so please Avoid ibuprofen/Advil/Aleve/Motrin/Goody Powders/Naproxen/BC powders/Meloxicam/Diclofenac/Indomethacin and other Nonsteroidal anti-inflammatory medications as these will make you more likely to bleed and can cause stomach ulcers, can also cause Kidney problems.  3) please take metolazone/Zaroxolyn 2.5 mg every Tuesday morning..,  Please also take potassium 20 meq every Tuesday morning with this 4)You need oxygen at home at 3 L via nasal cannula continuously while awake and while asleep---   5)Very low-salt diet advised 6)Weigh yourself daily, call if you gain more than 3 pounds in 1 day or more than 5 pounds in 1 week as your diuretic medications may need to be adjusted 7)Limit your Fluid  intake to No more than 60 ounces (1.8 Liters) per day 8) repeat BMP blood test on Wednesday, 08/26/2022 9) outpatient follow-up with cardiologist Dr. Diona Browner in about 3 weeks or so  Admission Diagnosis  Acute on chronic diastolic (congestive) heart failure (HCC) [I50.33]  Discharge Diagnosis  Acute on chronic diastolic (congestive) heart failure (HCC) [I50.33]    Principal Problem:   Acute on chronic diastolic (congestive) heart failure (HCC) Active Problems:   Essential hypertension, benign   Automatic implantable cardioverter-defibrillator in situ   Chronic kidney disease, stage III (moderate) (HCC)   Chronic atrial fibrillation (HCC)   Chronic obstructive  pulmonary disease, unspecified (HCC)   Chronic respiratory failure with hypoxia (HCC)      Past Medical History:  Diagnosis Date   Anxiety    Asthma    Chronic atrial fibrillation (HCC)    Chronic back pain    Chronic diastolic heart failure (HCC)    Chronic obstructive pulmonary disease, unspecified (HCC)    Complete heart block (HCC)    COPD (chronic obstructive pulmonary disease) (HCC)    Coronary atherosclerosis of native coronary artery    Multivessel status post CABG, DES PLA March 2006   DDD (degenerative disc disease), lumbar    Essential hypertension    Headache    ICD (implantable cardioverter-defibrillator) in place    Mixed hyperlipidemia    Osteoarthritis    Ventricular fibrillation (HCC) 2003   a. seen on PPM interrogation a/w syncope    Past Surgical History:  Procedure Laterality Date   CARDIAC DEFIBRILLATOR PLACEMENT     MDT dual chamber ICD   CARDIOVERSION N/A 08/09/2014   Procedure: CARDIOVERSION;  Surgeon: Thurmon Fair, MD;  Location: [facility];  Service: Cardiovascular;  Laterality: N/A;   CORONARY ARTERY BYPASS GRAFT     LIMA to LAD, SVG to diagonal, SVG to ramus and OM   ICD GENERATOR CHANGEOUT N/A  08/06/2021   Procedure: ICD GENERATOR CHANGEOUT;  Surgeon: Marinus Maw, MD;  Location: [facility] INVASIVE CV LAB;  Service: Cardiovascular;  Laterality: N/A;   IMPLANTABLE CARDIOVERTER DEFIBRILLATOR GENERATOR CHANGE N/A 09/25/2013   Procedure: IMPLANTABLE CARDIOVERTER DEFIBRILLATOR GENERATOR CHANGE;  Surgeon: Marinus Maw, MD;  Location: [facility] CATH LAB;  Service: Cardiovascular;  Laterality: N/A;   LEAD REVISION N/A 09/29/2013   Procedure: LEAD REVISION;  Surgeon: Duke Salvia, MD;  Location: [facility] CATH LAB;  Service: Cardiovascular;  Laterality: N/A;   TONSILLECTOMY     YAG LASER APPLICATION Left 12/27/2012   Procedure: YAG LASER APPLICATION;  Surgeon: Loraine Leriche T. Nile Riggs, MD;  Location: [facility];  Service: Ophthalmology;  Laterality: Left;   YAG LASER APPLICATION  Right 01/10/2013   Procedure: YAG LASER APPLICATION;  Surgeon: Loraine Leriche T. Nile Riggs, MD;  Location: [facility];  Service: Ophthalmology;  Laterality: Right;     HPI  from the history and physical done on the day of admission:   Chief Complaint: Difficulty Breathing, Leg swelling    HPI: [name] [name] is a 89 y.o. female with medical history significant for  Diastolic CHF, CKD 3b-4, CABG- 1610, Atria fibrillation on anti-coag, chronic respiratory failure on 3 L. Patient was sent to the ED from outpatient cardiology office for diuresis.  Patient has had ongoing bilateral lower extremity swelling, and difficulty breathing with exertion.  Since January this year, patient has put on about 16 pounds-baseline weight of about 154-179.  With ambulation just 10 to 15 feet at home, patient's O2 sats on 3 L dropped to 80s.  No chest pain. She has been compliant with torsemide 80 mg every other day alternating with 60 mg, and metolazone 2.5 was recently added.  Still with no improvement in edema.   ED Course: Temperature 98.5.  Heart rate 61, respiratory 20.  Blood pressure systolic 1 19-1 30.  O2 sat 100% on 3 L.  BNP 623.  Chest x-ray-small bilateral pleural effusions and bibasilar atelectasis.  No pulmonary edema. Lasix 80 mg x 1 given.   Review of Systems: As per HPI all other systems reviewed and negative.    Hospital Course:     Brief Narrative:  89 y.o. female with medical history significant for  Diastolic CHF, CKD 3b-4, CABG- 9604, Atria fibrillation on anti-coag, chronic respiratory failure on 3 L admitted with acute diastolic CHF exacerbation on 08/18/2022     -Assessment and Plan: 1)Acute on chronic diastolic (congestive) heart failure (HCC) -Admit to with weight gain, no extremity edema, elevated BNP, chest x-ray findings of pleural effusions and worsening shortness of breath after failing oral diuretics at home  -Recent echo-EF of 50 to 55%, RV pressure 59.1.   -PTA patient was on torsemide daily  (alternating between 60 and 80 mg every daily), and metolazone 2.5 mg   -Cardiology consult appreciated -Diuresed really well with IV Lasix -Weight is down to 154 pounds from 165 pounds -REDs Vest is down to 36 from 38 yesterday -Fluid balance remains negative -Dyspnea has improved significantly -Cardiologist recommends discharge on Demadex 80 mg daily, patient may take additional 40 mg if gains more than 3 pounds, take metolazone 2.5 mg and potassium 20 meq qTuesday -Repeat chest x-ray on 08/23/2022 noted small bilateral pleural effusions left greater than right and findings of possible bronchitis (Demadex and doxycycline as prescribed) -Outpatient follow-up with cardiologist Dr. Diona Browner in about 3 weeks advised -     2)Chronic obstructive pulmonary disease, unspecified (HCC) -Currently requiring 3 L per  nasal cannula  -at baseline uses 3 L per nasal cannula -Doxycycline as above for presumed bronchitis -Please see #1 above   3)Chronic atrial fibrillation/history of V. tach -status post AICD placement Stable.  -Continue Tikosyn, bisoprolol, Eliquis -Follow-up with Dr. Ladona Ridgel as outpatient   4)CKD stage -3B -Creatinine improved with diuresis -Avoid NSAIDs -Repeat BMP on Wednesday, 08/26/2022   5)HTN- Stable. C/n  bisoprolol 10 mg,  losartan 100 mg,, amlodipine 2.5 mg daily -Transitioned from IV Lasix to p.o. Demadex as above #1   6)CAD--- chest pain-free -s/p CABG in 1998 with LIMA-LAD, SVG-Diagonal, SVG-RI-OM, DES to PLA in 2006 and she did have a low risk NST in 09/2019.  Continue atorvastatin 20 mg daily and  Bisoprolol -No aspirin as patient is already on Eliquis   7)Generalized weakness and Deconditioning----PT eval appreciated -Discharge to SNF in relatively stable condition    Disposition: The patient is from: Home              Anticipated d/c is to: SNF    Discharge Condition: Stable  Follow UP   Contact information for follow-up providers     Jonelle Sidle, MD. Schedule an appointment as soon as possible for a visit in 3 week(s).   Specialty: Cardiology Contact information: [address] [place] [address] [phone]              Contact information for after-discharge care     Destination     [facility] .   Service: Skilled Nursing Contact information: [address] [place] [address] [phone]                     Consults obtained - Cardiology  Diet and Activity recommendation:  As advised  Discharge Instructions    Discharge Instructions     (HEART FAILURE PATIENTS) Call MD:  Anytime you have any of the following symptoms: 1) 3 pound weight gain in 24 hours or 5 pounds in 1 week 2) shortness of breath, with or without a dry hacking cough 3) swelling in the hands, feet or stomach 4) if you have to sleep on extra pillows at night in order to breathe.   Complete by: As directed    Call MD for:  difficulty breathing, headache or visual disturbances   Complete by: As directed    Call MD for:  persistant dizziness or light-headedness   Complete by: As directed    Call MD for:  temperature >100.4   Complete by: As directed    Diet - low sodium heart healthy   Complete by: As directed    Discharge instructions   Complete by: As directed    1)Please change Torsemide/Demadex to 80 mg every Morning----May Take additional 40 mg in the evening if you gain 3Lb or more 2)You are taking apixaban/Eliquis which is a blood thinner so please Avoid ibuprofen/Advil/Aleve/Motrin/Goody Powders/Naproxen/BC powders/Meloxicam/Diclofenac/Indomethacin and other Nonsteroidal anti-inflammatory medications as these will make you more likely to bleed and can cause stomach ulcers, can also cause Kidney problems.  3) please take metolazone/Zaroxolyn 2.5 mg every Tuesday morning..,  Please also take potassium 20 meq every Tuesday morning with this 4)You need oxygen at home at 3 L via  nasal cannula continuously while awake and while asleep---   5)Very low-salt diet advised 6)Weigh yourself daily, call if you gain more than 3 pounds in 1 day or more than 5 pounds in 1 week as your diuretic medications  may need to be adjusted 7)Limit your Fluid  intake to No more than 60 ounces (1.8 Liters) per day 8) repeat BMP blood test on Wednesday, 08/26/2022 9) outpatient follow-up with cardiologist Dr. Diona Browner in about 3 weeks or so   Increase activity slowly   Complete by: As directed          Discharge Medications     Allergies as of 08/23/2022       Reactions   Codeine Nausea And Vomiting   Fentanyl Itching   Itching, redness to scalp and neck   Keflex [cephalexin] Itching, Rash        Medication List     STOP taking these medications    amoxicillin-clavulanate 875-125 MG tablet Commonly known as: AUGMENTIN       TAKE these medications    acetaminophen 325 MG tablet Commonly known as: TYLENOL Take 2 tablets (650 mg total) by mouth every 6 (six) hours as needed for mild pain (or Fever >/= 101). What changed:  medication strength when to take this reasons to take this   albuterol 108 (90 Base) MCG/ACT inhaler Commonly known as: VENTOLIN HFA Inhale 2 puffs into the lungs every 6 (six) hours as needed for wheezing or shortness of breath.   amLODipine 2.5 MG tablet Commonly known as: NORVASC Take 1 tablet (2.5 mg total) by mouth daily.   apixaban 2.5 MG Tabs tablet Commonly known as: Eliquis Take 1 tablet (2.5 mg total) by mouth 2 (two) times daily. What changed: See the new instructions.   atorvastatin 20 MG tablet Commonly known as: LIPITOR Take 1 tablet (20 mg total) by mouth daily.   bisoprolol 5 MG tablet Commonly known as: ZEBETA Take 1 tablet (5 mg total) by mouth daily. Start taking on: Aug 24, 2022 What changed:  medication strength how much to take   CALCIUM 600 + D PO Take 1 tablet by mouth 2 (two) times daily.   dofetilide 125  MCG capsule Commonly known as: TIKOSYN Take 1 capsule (125 mcg total) by mouth 2 (two) times daily. What changed: See the new instructions.   doxycycline 100 MG tablet Commonly known as: VIBRA-TABS Take 1 tablet (100 mg total) by mouth 2 (two) times daily for 5 days.   folic acid 1 MG tablet Commonly known as: FOLVITE Take 1 tablet (1 mg total) by mouth daily. What changed: when to take this   losartan 100 MG tablet Commonly known as: COZAAR Take 1 tablet (100 mg total) by mouth daily. What changed: See the new instructions.   metolazone 2.5 MG tablet Commonly known as: ZAROXOLYN Take 1 tablet (2.5 mg total) by mouth every Tuesday. Take  30 minutes prior to [employer] taking on: Aug 25, 2022 What changed:  how much to take how to take this when to take this additional instructions   polyethylene glycol 17 g packet Commonly known as: MIRALAX / GLYCOLAX Take 17 g by mouth daily as needed for mild constipation.   potassium chloride SA 20 MEQ tablet Commonly known as: KLOR-CON M Take 1 tablet (20 mEq total) by mouth every Tuesday. Start taking on: Aug 25, 2022 What changed: See the new instructions.   SYSTANE OP Place 1 drop into both eyes daily as needed (Dry eyes).   Torsemide 40 MG Tabs Take 80 mg by mouth every morning. May Take additional 40 mg in the evening if you gain 3Lb or more What changed:  medication strength how much to take how to take  this when to take this additional instructions   Trelegy Ellipta 100-62.5-25 MCG/ACT Aepb Generic drug: Fluticasone-Umeclidin-Vilant Inhale 1 puff into the lungs daily.   triamcinolone cream 0.1 % Commonly known as: KENALOG Apply 1 application. topically daily as needed (itching).   Vitamin D 50 MCG (2000 UT) Caps Take 2,000 Units by mouth at bedtime.       Major procedures and Radiology Reports - PLEASE review detailed and final reports for all details, in brief -   DG CHEST PORT 1 VIEW  Result Date:  08/23/2022 CLINICAL DATA:  89 year old female with history of dyspnea. EXAM: PORTABLE CHEST 1 VIEW COMPARISON:  Chest x-ray 08/18/2022. FINDINGS: Bibasilar opacities (left-greater-than-right) which may reflect areas of atelectasis and/or consolidation, with superimposed small bilateral pleural effusions (left-greater-than-right). Additional areas of interstitial prominence and widespread peribronchial cuffing noted throughout both lungs. No pneumothorax. No evidence of pulmonary edema. Heart size is mildly enlarged. Atherosclerotic calcifications in the thoracic aorta. Left-sided pacemaker/AICD noted with lead tips projecting over the expected location of the right atrium and right ventricle. Status post median sternotomy for CABG including LIMA. IMPRESSION: 1. Persistent bibasilar areas of atelectasis and/or consolidation with superimposed small bilateral pleural effusions (left-greater-than-right). 2. Diffuse interstitial prominence and peribronchial cuffing, concerning for an acute bronchitis. 3. Mild cardiomegaly. 4. Aortic atherosclerosis. Electronically Signed   By: Trudie Reed M.D.   On: 08/23/2022 08:06   DG Chest 2 View  Result Date: 08/18/2022 CLINICAL DATA:  Shortness of breath. EXAM: CHEST - 2 VIEW COMPARISON:  08/10/2022 FINDINGS: The pacer wires are stable. Stable surgical changes from coronary artery bypass surgery persistent small bilateral pleural effusions and bibasilar atelectasis. No pulmonary edema or pulmonary infiltrates. IMPRESSION: Persistent small bilateral pleural effusions and bibasilar atelectasis. No pulmonary edema. Electronically Signed   By: Rudie Meyer M.D.   On: 08/18/2022 14:22   DG Chest 2 View  Result Date: 08/11/2022 CLINICAL DATA:  Pleural effusions, follow-up EXAM: CHEST - 2 VIEW COMPARISON:  07/02/2022 FINDINGS: LEFT subclavian ICD with leads projecting at RIGHT atrium and RIGHT ventricle, unchanged. Enlargement of cardiac silhouette post CABG. Stable  mediastinal contours and pulmonary vascularity with atherosclerotic calcification aorta. Persistent bibasilar pleural effusions and atelectasis slightly increased on LEFT since prior exam. Minimal perihilar edema. No pneumothorax or acute osseous findings. IMPRESSION: Mild pulmonary edema with bibasilar effusions and atelectasis slightly increased on LEFT since previous exam. Aortic Atherosclerosis (ICD10-I70.0). Electronically Signed   By: Ulyses Southward M.D.   On: 08/11/2022 14:50   CUP PACEART REMOTE DEVICE CHECK  Result Date: 08/10/2022 Scheduled remote reviewed. Normal device function.  8 VHR detections consistent with NSVT and VF of short duration, no therapies. 265 AHR totaling 0.8% of total time, EGMs consistent with atrial flutter.  Longest AHR lasted 10 minutes.  On Eliquis and Tikosyn.  HF trends abnormal during February to early March, within normal limits since mid March 2024. Next remote 91 days. Rica Mote, RN, CVRS   Today   Subjective    Noomi Scala today has no new complaints  -Voiding well -Son Jonny Ruiz) at bedside -Concerns about insomnia but patient declines sleep aid -Oxygen requirement at baseline -Dyspnea has improved   Patient has been seen and examined prior to discharge   Objective   Blood pressure (!) 128/56, pulse 63, temperature 98.6 F (37 C), resp. rate 20, height 5' (1.524 m), weight 70.1 kg, SpO2 96 %.   Intake/Output Summary (Last 24 hours) at 08/23/2022 1219 Last data filed at 08/23/2022 0420 Gross per 24  hour  Intake 240 ml  Output 1450 ml  Net -1210 ml   Exam Gen:- Awake Alert, in no acute distress  HEENT:- [place].AT, No sclera icterus Nose- [place] 3L/min Neck-Supple Neck, No JVD,.  Lungs-improved air movement, no wheezing or rhonchi  CV- S1, S2 normal, irregular , AICD in situ Abd-  +ve B.Sounds, Abd Soft, No tenderness,    Extremity/Skin:-Improved edema, chronic venous status noted,  pedal pulses present  Psych-affect is appropriate, oriented  x3 Neuro-generalized weakness, no new focal deficits, no tremors   Data Review   CBC w Diff:  Lab Results  Component Value Date   WBC 8.9 08/18/2022   HGB 11.1 (L) 08/18/2022   HGB 13.1 04/23/2022   HCT 34.7 (L) 08/18/2022   HCT 40.0 04/23/2022   PLT 196 08/18/2022   PLT 211 04/23/2022   LYMPHOPCT 20 08/18/2022   MONOPCT 13 08/18/2022   EOSPCT 1 08/18/2022   BASOPCT 1 08/18/2022   CMP:  Lab Results  Component Value Date   NA 141 08/22/2022   NA 144 07/07/2022   K 4.0 08/22/2022   CL 98 08/22/2022   CO2 34 (H) 08/22/2022   BUN 40 (H) 08/22/2022   BUN 56 (H) 07/07/2022   CREATININE 1.42 (H) 08/22/2022   CREATININE 0.99 (H) 02/06/2015   PROT 7.5 08/18/2022   ALBUMIN 4.3 08/18/2022   BILITOT 1.5 (H) 08/18/2022   ALKPHOS 62 08/18/2022   AST 25 08/18/2022   ALT 12 08/18/2022  .  Total Discharge time is about 33 minutes  Shon Hale M.D on 08/23/2022 at 12:19 PM  Go to [email or web address] -  for contact info  [facility] - Office  [phone]

## 2022-08-23 NOTE — Discharge Instructions (Signed)
1)Please change Torsemide/Demadex to 80 mg every Morning----May Take additional 40 mg in the evening if you gain 3Lb or more 2)You are taking apixaban/Eliquis which is a blood thinner so please Avoid ibuprofen/Advil/Aleve/Motrin/Goody Powders/Naproxen/BC powders/Meloxicam/Diclofenac/Indomethacin and other Nonsteroidal anti-inflammatory medications as these will make you more likely to bleed and can cause stomach ulcers, can also cause Kidney problems.  3) please take metolazone/Zaroxolyn 2.5 mg every Tuesday morning..,  Please also take potassium 20 meq every Tuesday morning with this 4)You need oxygen at home at 3 L via nasal cannula continuously while awake and while asleep---   5)Very low-salt diet advised 6)Weigh yourself daily, call if you gain more than 3 pounds in 1 day or more than 5 pounds in 1 week as your diuretic medications may need to be adjusted 7)Limit your Fluid  intake to No more than 60 ounces (1.8 Liters) per day 8) repeat BMP blood test on Wednesday, 08/26/2022 9) outpatient follow-up with cardiologist Dr. Diona Browner in about 3 weeks or so

## 2022-08-23 NOTE — Progress Notes (Signed)
   08/23/22 0800  ReDS Vest / Clip  Station Marker A  Ruler Value 29  ReDS Value Range 36 - 40  ReDS Actual Value 36

## 2022-08-23 NOTE — Progress Notes (Signed)
Ng Discharge Note  Admit Date:  08/18/2022 Discharge date: 08/23/2022   Roanna Banning to be D/C'd Skilled nursing facility per MD order.  AVS completed. Patient/caregiver able to verbalize understanding.  Discharge Medication: Allergies as of 08/23/2022       Reactions   Codeine Nausea And Vomiting   Fentanyl Itching   Itching, redness to scalp and neck   Keflex [cephalexin] Itching, Rash        Medication List     STOP taking these medications    amoxicillin-clavulanate 875-125 MG tablet Commonly known as: AUGMENTIN       TAKE these medications    acetaminophen 325 MG tablet Commonly known as: TYLENOL Take 2 tablets (650 mg total) by mouth every 6 (six) hours as needed for mild pain (or Fever >/= 101). What changed:  medication strength when to take this reasons to take this   albuterol 108 (90 Base) MCG/ACT inhaler Commonly known as: VENTOLIN HFA Inhale 2 puffs into the lungs every 6 (six) hours as needed for wheezing or shortness of breath.   amLODipine 2.5 MG tablet Commonly known as: NORVASC Take 1 tablet (2.5 mg total) by mouth daily.   apixaban 2.5 MG Tabs tablet Commonly known as: Eliquis Take 1 tablet (2.5 mg total) by mouth 2 (two) times daily. What changed: See the new instructions.   atorvastatin 20 MG tablet Commonly known as: LIPITOR Take 1 tablet (20 mg total) by mouth daily.   bisoprolol 5 MG tablet Commonly known as: ZEBETA Take 1 tablet (5 mg total) by mouth daily. Start taking on: Aug 24, 2022 What changed:  medication strength how much to take   CALCIUM 600 + D PO Take 1 tablet by mouth 2 (two) times daily.   dofetilide 125 MCG capsule Commonly known as: TIKOSYN Take 1 capsule (125 mcg total) by mouth 2 (two) times daily. What changed: See the new instructions.   doxycycline 100 MG tablet Commonly known as: VIBRA-TABS Take 1 tablet (100 mg total) by mouth 2 (two) times daily for 5 days.   folic acid 1 MG tablet Commonly known  as: FOLVITE Take 1 tablet (1 mg total) by mouth daily. What changed: when to take this   losartan 100 MG tablet Commonly known as: COZAAR Take 1 tablet (100 mg total) by mouth daily. What changed: See the new instructions.   metolazone 2.5 MG tablet Commonly known as: ZAROXOLYN Take 1 tablet (2.5 mg total) by mouth every Tuesday. Take  30 minutes prior to [employer] taking on: Aug 25, 2022 What changed:  how much to take how to take this when to take this additional instructions   polyethylene glycol 17 g packet Commonly known as: MIRALAX / GLYCOLAX Take 17 g by mouth daily as needed for mild constipation.   potassium chloride SA 20 MEQ tablet Commonly known as: KLOR-CON M Take 1 tablet (20 mEq total) by mouth every Tuesday. Start taking on: Aug 25, 2022 What changed: See the new instructions.   SYSTANE OP Place 1 drop into both eyes daily as needed (Dry eyes).   Torsemide 40 MG Tabs Take 80 mg by mouth every morning. May Take additional 40 mg in the evening if you gain 3Lb or more What changed:  medication strength how much to take how to take this when to take this additional instructions   Trelegy Ellipta 100-62.5-25 MCG/ACT Aepb Generic drug: Fluticasone-Umeclidin-Vilant Inhale 1 puff into the lungs daily.   triamcinolone cream 0.1 % Commonly  known as: KENALOG Apply 1 application. topically daily as needed (itching).   Vitamin D 50 MCG (2000 UT) Caps Take 2,000 Units by mouth at bedtime.        Discharge Assessment: Vitals:   08/23/22 0744 08/23/22 1315  BP:  94/67  Pulse:  68  Resp:  16  Temp:  99.1 F (37.3 C)  SpO2: 96% 98%   Skin clean, dry and intact without evidence of skin break down, no evidence of skin tears noted. IV catheter discontinued intact. Site without signs and symptoms of complications - no redness or edema noted at insertion site, patient denies c/o pain - only slight tenderness at site.  Dressing with slight pressure  applied.  D/c Instructions-Education: Discharge instructions given to patient/family with verbalized understanding. D/c education completed with patient/family including follow up instructions, medication list, d/c activities limitations if indicated, with other d/c instructions as indicated by MD - patient able to verbalize understanding, all questions fully answered. Patient instructed to return to ED, call 911, or call MD for any changes in condition.  Patient escorted via wheelchair by staff to [facility]  Cristal Ford, LPN 04/25/1094 0:45 PM

## 2022-08-24 ENCOUNTER — Encounter: Payer: Self-pay | Admitting: Internal Medicine

## 2022-08-24 ENCOUNTER — Non-Acute Institutional Stay (SKILLED_NURSING_FACILITY): Payer: Medicare PPO | Admitting: Internal Medicine

## 2022-08-24 DIAGNOSIS — N1832 Chronic kidney disease, stage 3b: Secondary | ICD-10-CM

## 2022-08-24 DIAGNOSIS — I5033 Acute on chronic diastolic (congestive) heart failure: Secondary | ICD-10-CM

## 2022-08-24 DIAGNOSIS — J9611 Chronic respiratory failure with hypoxia: Secondary | ICD-10-CM

## 2022-08-24 NOTE — Patient Instructions (Signed)
See assessment and plan under each diagnosis in the problem list and acutely for this visit 

## 2022-08-24 NOTE — Progress Notes (Signed)
NURSING HOME LOCATION:  [facility] ROOM NUMBER:  [id number]  CODE STATUS:  Full Code  PCP:  [name] MD  This is a comprehensive admission note to this SNFperformed on this date less than 30 days from date of admission. Included are preadmission medical/surgical history; reconciled medication list; family history; social history and comprehensive review of systems.  Corrections and additions to the records were documented. Comprehensive physical exam was also performed. Additionally a clinical summary was entered for each active diagnosis pertinent to this admission in the Problem List to enhance continuity of care.  HPI: She was hospitalized 4/30 - 08/23/2022 with acute on chronic diastolic congestive heart failure.  This is in the context of chronic respiratory failure for which she is on 3 L of nasal oxygen.  Cardiology referred her to the ED for diuresis because of persistent bilateral lower extremity edema and exertional dyspnea.  By history ambulation of 10-15 feet PTA resulted in O2 sats in the 80s despite the supplemental oxygen.  History includes 16 pound weight gain since January of this year. The edema persisted inspite of compliance with torsemide 80 mg alternating with 60 mg daily as well as recent addition of metolazone 2.5 mg. Chest x-ray revealed pleural effusions; BNP was elevated to 623.0. Recent ECHO revealed EF of 50-55% with RV pressure of 59.1. Diuresis was achieved with IV furosemide with a decrease in weight to 154 pounds from admission weight of 165 pounds.  REDS vest was titrated from 38 to 36.  Clinically there was improvement in her exertional dyspnea.  Cardiology recommended Demadex 80 mg daily with an additional 40 mg for weight gain greater than 3 pounds.  She was to take metolazone 2.5 mg and potassium 20 mill equivalents every Tuesday.  Follow-up chest x-ray 5/5 suggested the possibility of bronchitis for which doxycycline was prescribed.  The follow-up  film revealed small bilateral pleural effusions, greater on the left. Tikosyn, bisoprolol and Eliquis were continued for her CIF. CKD did improve with diuresis.  While hospitalized creatinine ranged from a high of 1.86 down to 1.35; value was 1.42 prior to discharge.  GFR was 26 on admission with a peak of 38.  At discharge value was 36 indicating CKD stage IIIb.  Normochromic, normocytic anemia was present with H/H of 11.1/34.7.  Despite the radiographic findings suggesting possible bronchitis; white count and differential were normal. PT/OT recommended SNF placement for rehab.  Past medical and surgical history: Includes history of CHF, chronic diastolic heart failure, history of CHB, CAD, DDD, essential hypertension, history of V-fib, and mixed dyslipidemia. Surgeries and procedures include cardioversion, CABG, implantation of cardioverter-defibrillator, and YAG laser application.  Social history: Nondrinker; non-smoker. She is a retired Engineer, civil (consulting).  Family history: Noncontributory due to advanced age.   Review of systems: She is intelligent and communicative.  She has an excellent understanding of her hospital course.  She states that she lost "11 pounds of fluid" while hospitalized.  Cardiology and Pulmonary follow-up appointments are this Thursday.  Dr. Sherene Sires is recommending a CT scan to help define her COPD as she has no history of asthma and has never smoked.  She states that her breathing is better unless she is active such as going to the bathroom.  She describes thoracic soreness which she relates to the volume overload prior to aggressive diuresis.   She has chronic low back pain.  She has numbness in her feet and hands.  She denies anxiety or depression but her son  feels that she is anxious.  He also feels that she may have undiagnosed obstructive sleep apnea describing her as "waking up gasping."  She does sleep poorly but relates this to the back discomfort and the uncomfortable  bed.  Constitutional: No fever Eyes: No redness, discharge, pain, vision change ENT/mouth: No nasal congestion, purulent discharge, earache, change in hearing, sore throat  Cardiovascular: No palpitations, paroxysmal nocturnal dyspnea, claudication  Respiratory: No sputum production, hemoptysis, significant snoring  Gastrointestinal: No heartburn, dysphagia, abdominal pain, nausea /vomiting, rectal bleeding, melena, change in bowels Genitourinary: No dysuria, hematuria, pyuria, incontinence, nocturia Dermatologic: No rash, pruritus Neurologic: No dizziness, headache, syncope, seizures Psychiatric: No significant anorexia Endocrine: No change in hair/nails, excessive thirst, excessive hunger  Hematologic/lymphatic: No significant lymphadenopathy, abnormal bleeding Allergy/immunology: No itchy/watery eyes, significant sneezing, urticaria, angioedema  Physical exam:  Pertinent or positive findings: She appears her age and somewhat chronically ill.  Ptosis is greater on the left than the right.  She is wearing nasal oxygen.  Breath sounds are markedly decreased presenting as  "silent lungs."  She has intermittent nonproductive cough.  Heart rhythm is irregular; she has a grade 1 systolic murmur.  Abdomen is protuberant.  She has tense nonpitting edema of the lower extremities.  Pedal pulses are nonpalpable.  She has ecchymoses in a scattered distribution over the forearms and dorsum of the hands.  She has stasis hyperpigmentation over the shins with scattered vitiliginous scarring greater on the left than the right.  Interosseous wasting of the hands is present.  General appearance:  no acute distress, increased work of breathing is present.   Lymphatic: No lymphadenopathy about the head, neck, axilla. Eyes: No conjunctival inflammation or lid edema is present. There is no scleral icterus. Ears:  External ear exam shows no significant lesions or deformities.   Nose:  External nasal examination  shows no deformity or inflammation. Nasal mucosa are pink and moist without lesions, exudates Oral exam: Lips and gums are healthy appearing.There is no oropharyngeal erythema or exudate. Neck:  No thyromegaly, masses, tenderness noted.    Heart:  No gallop, click, rub.  Lungs:  without wheezes, rhonchi, rales, rubs. Abdomen: Bowel sounds are normal.  Abdomen is soft and nontender with no organomegaly, hernias, masses. GU: Deferred  Extremities:  No cyanosis, clubbing. Neurologic exam:  Strength equal  in upper & lower extremities. Balance, Rhomberg, finger to nose testing could not be completed due to clinical state Deep tendon reflexes are equal Skin: Warm & dry w/o tenting.  See clinical summary under each active problem in the Problem List with associated updated therapeutic plan

## 2022-08-24 NOTE — Assessment & Plan Note (Signed)
4/30 - 08/23/2022 nadir GFR 26 with predischarge value of 36 indicating CKD stage IIIb.  Med list reviewed; no change in present medications or dosages unless there is progression of CKD.

## 2022-08-24 NOTE — Assessment & Plan Note (Addendum)
Cardiology recommended Demadex 80 mg daily with additional 40 for weight gain greater than 3 pounds.  Metolazone 2.5 potassium 20 mEq every Tuesday. Sodium restricted diet.

## 2022-08-24 NOTE — Assessment & Plan Note (Signed)
Current O2 sats 96% on 3 L/min nasal oxygen.  S/P doxycycline for clinical bronchitis.

## 2022-08-26 ENCOUNTER — Encounter: Payer: Self-pay | Admitting: Adult Health

## 2022-08-26 ENCOUNTER — Non-Acute Institutional Stay (SKILLED_NURSING_FACILITY): Payer: Medicare PPO | Admitting: Adult Health

## 2022-08-26 ENCOUNTER — Other Ambulatory Visit (HOSPITAL_COMMUNITY)
Admission: RE | Admit: 2022-08-26 | Discharge: 2022-08-26 | Disposition: A | Discharge status: Home / Self Care | Payer: Medicare PPO | Source: Skilled Nursing Facility | Attending: Adult Health | Admitting: Adult Health

## 2022-08-26 DIAGNOSIS — D631 Anemia in chronic kidney disease: Secondary | ICD-10-CM | POA: Diagnosis not present

## 2022-08-26 DIAGNOSIS — N1832 Chronic kidney disease, stage 3b: Secondary | ICD-10-CM | POA: Diagnosis not present

## 2022-08-26 DIAGNOSIS — I5033 Acute on chronic diastolic (congestive) heart failure: Secondary | ICD-10-CM | POA: Insufficient documentation

## 2022-08-26 LAB — CBC
HCT: 30.9 % — ABNORMAL LOW (ref 36.0–46.0)
Hemoglobin: 9.8 g/dL — ABNORMAL LOW (ref 12.0–15.0)
MCH: 31.2 pg (ref 26.0–34.0)
MCHC: 31.7 g/dL (ref 30.0–36.0)
MCV: 98.4 fL (ref 80.0–100.0)
Platelets: 190 10*3/uL (ref 150–400)
RBC: 3.14 MIL/uL — ABNORMAL LOW (ref 3.87–5.11)
RDW: 15.4 % (ref 11.5–15.5)
WBC: 9.8 10*3/uL (ref 4.0–10.5)
nRBC: 0 % (ref 0.0–0.2)

## 2022-08-26 LAB — BASIC METABOLIC PANEL
Anion gap: 11 (ref 5–15)
BUN: 63 mg/dL — ABNORMAL HIGH (ref 8–23)
CO2: 31 mmol/L (ref 22–32)
Calcium: 8.9 mg/dL (ref 8.9–10.3)
Chloride: 97 mmol/L — ABNORMAL LOW (ref 98–111)
Creatinine, Ser: 1.55 mg/dL — ABNORMAL HIGH (ref 0.44–1.00)
GFR, Estimated: 32 mL/min — ABNORMAL LOW (ref >60)
Glucose, Bld: 96 mg/dL (ref 70–99)
Potassium: 3.4 mmol/L — ABNORMAL LOW (ref 3.5–5.1)
Sodium: 139 mmol/L (ref 135–145)

## 2022-08-26 NOTE — Progress Notes (Signed)
Cardiology Office Note  Date: 08/27/2022   ID: Michalah, Abadilla November 24, 1931, MRN [id number]  History of Present Illness: [name] [name] is an 89 y.o. female most recently seen in the office in late April by Ms. Strader PA-C, and ultimately hospitalized with acute on chronic diastolic heart failure.  I reviewed her records.  She diuresed well on IV Lasix with associated improvement in renal function, was discharged to inpatient rehabilitation at the [facility] on Demadex 80 mg daily with plan for as needed use of an additional 40 mg based on weight change and fluid retention.  She was also to use metolazone 2.5 mg once weekly with potassium supplement.  She is here today with her son and daughter for follow-up.  Breathing has improved overall, she remains on supplemental oxygen as before.  Degree of leg edema also looks better.  Today we went over her medications in detail and discussed adjustments as noted below.  Follow-up lab work yesterday showed BUN 63, creatinine 1.55, and potassium 3.4.  Medtronic ICD in place with follow-up by Dr. Ladona Ridgel.  Device interrogation in April did show brief ventricular arrhythmias not requiring therapy and low AF/flutter burden at 0.8%.  She does have a referral placed for consultation in the advanced heart failure clinic.  Question of right heart catheterization had been brought up by Dr. Sherene Sires in light of pulmonary hypertension.  My sense is that this is WHO group 2 and 3 pulmonary hypertension and that we should likely focus on medical therapy at this time rather than putting her through further invasive procedures that will most likely not change the overall treatment plan.  Having said that, it is not unreasonable to at least discuss the situation via consultation and the patient and her family members seem amenable to this.  Physical Exam: VS:  BP (!) 108/50   Pulse 76   Ht 5' (1.524 m)   Wt 156 lb (70.8 kg)   SpO2 96% Comment: oxygen 3 liters Watson  BMI  30.47 kg/m , BMI Body mass index is 30.47 kg/m.  Wt Readings from Last 3 Encounters:  08/27/22 156 lb (70.8 kg)  08/27/22 155 lb 9.6 oz (70.6 kg)  08/26/22 156 lb 6.4 oz (70.9 kg)    General: Patient appears comfortable, seated in wheelchair wearing supplemental oxygen via nasal cannula. HEENT: Conjunctiva and lids normal.. Neck: Supple, no elevated JVP or carotid bruits. Lungs: Diminished breath sounds without wheezing. Cardiac: Regular rate and rhythm, no S3, 2/6 systolic murmur. Extremities: Chronic appearing edema and lymphedema, improved from last evaluation..  ECG:  An ECG dated 08/20/2022 was personally reviewed today and demonstrated:  Dual-chamber paced rhythm.  Labwork: 04/23/2022: TSH 2.810 08/18/2022: ALT 12; AST 25; B Natriuretic Peptide 623.0 08/22/2022: Magnesium 2.4 08/26/2022: BUN 63; Creatinine, Ser 1.55; Hemoglobin 9.8; Platelets 190; Potassium 3.4; Sodium 139   Reviewed Today:  Echocardiogram 07/08/2022:  1. Left ventricular ejection fraction, by estimation, is 50 to 55%. Left  ventricular ejection fraction by 3D volume is 50 %. The left ventricle has  low normal function. The left ventricle has no regional wall motion  abnormalities. Left ventricular  diastolic function could not be evaluated.   2. Right ventricular systolic function is moderately reduced. The right  ventricular size is moderately enlarged. There is moderately elevated  pulmonary artery systolic pressure. The estimated right ventricular  systolic pressure is 59.1 mmHg.   3. Left atrial size was severely dilated.   4. Right atrial size was  severely dilated.   5. The mitral valve is normal in structure. Mild to moderate mitral valve  regurgitation.   6. Tricuspid valve regurgitation is moderate to severe.   7. The aortic valve is tricuspid. There is mild calcification of the  aortic valve. There is mild thickening of the aortic valve. Aortic valve  regurgitation is mild. Aortic valve sclerosis is  present, with no evidence  of aortic valve stenosis.   8. The inferior vena cava is dilated in size with <50% respiratory  variability, suggesting right atrial pressure of 15 mmHg.   Assessment and Plan:  1.  HFpEF with suspected WHO group 2 and 3 pulmonary hypertension.  Echocardiogram from March revealed LVEF 50 to 55% with moderately reduced RV contraction and estimated RVSP 59 mmHg.  She does have consultation placed with the advanced heart failure clinic as discussed above, mainly for discussion and evaluation of her current treatment course.  Although a right heart catheterization could be considered as has been brought up previously by Dr. Sherene Sires, I think that focusing on medical therapy adjustments without putting her through further invasive testing makes the most sense at this time.  Plan to start Farxiga 10 mg daily, cut Demadex back to 60 mg daily, change KCl to 20 mEq daily.  We will arrange close follow-up for next Friday with BMET.  Also stop Norvasc for now.  2.  Multivessel CAD status post CABG in 1998 with LIMA to LAD, SVG to diagonal, SVG to ramus intermedius and OM, and ultimately DES to the PLA in 2006.  Follow-up Lexiscan Myoview in 2021 was normal.  She does not report any active angina.  Not on aspirin given use of Eliquis.  Continue Lipitor.  3.  Paroxysmal atrial fibrillation with CHA2DS2-VASc score of 5.  She remains on Tikosyn, bisoprolol, and Eliquis.  She has had relatively low rhythm burden on current therapy based on device interrogations.  Recheck BMET next week after recent medication adjustments.  4.  Medtronic ICD in place with follow-up with Dr. Ladona Ridgel.  Has remote history of VT and also heart block, no recent ventricular arrhythmias requiring therapy.  5.  CKD stage IIIb.  Most recent creatinine 1.55.  6.  Chronic hypoxic respiratory failure.  She has been seen by Dr. Sherene Sires, PFT suggesting obstructive airways disease and also diffusion defect.  She is on  supplemental oxygen at baseline.  7.  Mixed hyperlipidemia.  Continue Lipitor at current dose.  Disposition:  Follow up Friday, May 17 with Ms. Iran Ouch PA C.  Signed, Jonelle Sidle, M.D., F.A.C.C. [facility]

## 2022-08-26 NOTE — Progress Notes (Signed)
Location:  [facility] Room Number: NO/143/P Place of Service:  SNF (31) Babatunde Seago S.,NP   CODE STATUS: FULL  Allergies  Allergen Reactions   Codeine Nausea And Vomiting   Fentanyl Itching    Itching, redness to scalp and neck   Keflex [Cephalexin] Itching and Rash    Chief Complaint  Patient presents with   Acute Visit    Patient is being seen for follow up on labs    HPI:  She has stage 3b chronic kidney disease. Her renal function has slightly declined to a gfr of 32. She is presently taking torsemide 80 mg daily with 40 mg daily as needed and metolazone weekly. She is due to see cardiology in the AM.   Past Medical History:  Diagnosis Date   Anxiety    Asthma    Chronic atrial fibrillation (HCC)    Chronic back pain    Chronic diastolic heart failure (HCC)    Chronic obstructive pulmonary disease, unspecified (HCC)    Complete heart block (HCC)    COPD (chronic obstructive pulmonary disease) (HCC)    Coronary atherosclerosis of native coronary artery    Multivessel status post CABG, DES PLA March 2006   DDD (degenerative disc disease), lumbar    Essential hypertension    Headache    ICD (implantable cardioverter-defibrillator) in place    Mixed hyperlipidemia    Osteoarthritis    Ventricular fibrillation (HCC) 2003   a. seen on PPM interrogation a/w syncope    Past Surgical History:  Procedure Laterality Date   CARDIAC DEFIBRILLATOR PLACEMENT     MDT dual chamber ICD   CARDIOVERSION N/A 08/09/2014   Procedure: CARDIOVERSION;  Surgeon: Thurmon Fair, MD;  Location: [facility] ENDOSCOPY;  Service: Cardiovascular;  Laterality: N/A;   CORONARY ARTERY BYPASS GRAFT     LIMA to LAD, SVG to diagonal, SVG to ramus and OM   ICD GENERATOR CHANGEOUT N/A 08/06/2021   Procedure: ICD GENERATOR CHANGEOUT;  Surgeon: Marinus Maw, MD;  Location: [facility] INVASIVE CV LAB;  Service: Cardiovascular;  Laterality: N/A;   IMPLANTABLE CARDIOVERTER DEFIBRILLATOR  GENERATOR CHANGE N/A 09/25/2013   Procedure: IMPLANTABLE CARDIOVERTER DEFIBRILLATOR GENERATOR CHANGE;  Surgeon: Marinus Maw, MD;  Location: [facility] CATH LAB;  Service: Cardiovascular;  Laterality: N/A;   LEAD REVISION N/A 09/29/2013   Procedure: LEAD REVISION;  Surgeon: Duke Salvia, MD;  Location: [facility] CATH LAB;  Service: Cardiovascular;  Laterality: N/A;   TONSILLECTOMY     YAG LASER APPLICATION Left 12/27/2012   Procedure: YAG LASER APPLICATION;  Surgeon: Loraine Leriche T. Nile Riggs, MD;  Location: [facility] ORS;  Service: Ophthalmology;  Laterality: Left;   YAG LASER APPLICATION Right 01/10/2013   Procedure: YAG LASER APPLICATION;  Surgeon: Loraine Leriche T. Nile Riggs, MD;  Location: [facility] ORS;  Service: Ophthalmology;  Laterality: Right;    Social History   Socioeconomic History   Marital status: Widowed    Spouse name: Not on file   Number of children: 2   Years of education: Not on file   Highest education level: Not on file  Occupational History   Occupation: Retired    Comment: Copywriter, advertising: RETIRED  Tobacco Use   Smoking status: Never   Smokeless tobacco: Never  Vaping Use   Vaping Use: Never used  Substance and Sexual Activity   Alcohol use: No    Alcohol/week: 0.0 standard drinks of alcohol   Drug use: No   Sexual activity: Never  Other Topics Concern  Not on file  Social History Narrative   Not on file   Social Determinants of Health   Financial Resource Strain: Not on file  Food Insecurity: No Food Insecurity (08/18/2022)   Hunger Vital Sign    Worried About Running Out of Food in the Last Year: Never true    Ran Out of Food in the Last Year: Never true  Transportation Needs: No Transportation Needs (08/18/2022)   PRAPARE - Administrator, Civil Service (Medical): No    Lack of Transportation (Non-Medical): No  Physical Activity: Not on file  Stress: Not on file  Social Connections: Not on file  Intimate Partner Violence: Not At Risk (08/18/2022)   Humiliation,  Afraid, Rape, and Kick questionnaire    Fear of Current or Ex-Partner: No    Emotionally Abused: No    Physically Abused: No    Sexually Abused: No   Family History  Problem Relation Age of Onset   Heart attack Father    Heart attack Brother       VITAL SIGNS BP (!) 111/59   Pulse 79   Temp 98.7 F (37.1 C)   Resp 20   Ht 5' (1.524 m)   Wt 156 lb 6.4 oz (70.9 kg)   SpO2 94%   BMI 30.54 kg/m   Outpatient Encounter Medications as of 08/26/2022  Medication Sig   acetaminophen (TYLENOL) 325 MG tablet Take 2 tablets (650 mg total) by mouth every 6 (six) hours as needed for mild pain (or Fever >/= 101).   albuterol (VENTOLIN HFA) 108 (90 Base) MCG/ACT inhaler Inhale 2 puffs into the lungs every 6 (six) hours as needed for wheezing or shortness of breath.   amLODipine (NORVASC) 2.5 MG tablet Take 1 tablet (2.5 mg total) by mouth daily.   apixaban (ELIQUIS) 2.5 MG TABS tablet Take 1 tablet (2.5 mg total) by mouth 2 (two) times daily.   atorvastatin (LIPITOR) 20 MG tablet Take 1 tablet (20 mg total) by mouth daily.   bisoprolol (ZEBETA) 5 MG tablet Take 1 tablet (5 mg total) by mouth daily.   Calcium Carbonate-Vitamin D (CALCIUM 600 + D PO) Take 1 tablet by mouth 2 (two) times daily.   Cholecalciferol (VITAMIN D) 2000 UNITS CAPS Take 2,000 Units by mouth at bedtime.   dofetilide (TIKOSYN) 125 MCG capsule Take 1 capsule (125 mcg total) by mouth 2 (two) times daily.   doxycycline (VIBRA-TABS) 100 MG tablet Take 1 tablet (100 mg total) by mouth 2 (two) times daily for 5 days.   Fluticasone-Umeclidin-Vilant (TRELEGY ELLIPTA) 100-62.5-25 MCG/ACT AEPB Inhale 1 puff into the lungs daily.   folic acid (FOLVITE) 1 MG tablet Take 1 tablet (1 mg total) by mouth daily.   losartan (COZAAR) 100 MG tablet Take 1 tablet (100 mg total) by mouth daily.   metolazone (ZAROXOLYN) 2.5 MG tablet Take 1 tablet (2.5 mg total) by mouth every Tuesday. Take  30 minutes prior to Torsemide   Polyethyl Glycol-Propyl  Glycol (SYSTANE OP) Place 1 drop into both eyes daily as needed (Dry eyes).   polyethylene glycol (MIRALAX / GLYCOLAX) 17 g packet Take 17 g by mouth daily as needed for mild constipation.   potassium chloride SA (KLOR-CON M) 20 MEQ tablet Take 1 tablet (20 mEq total) by mouth every Tuesday.   torsemide 40 MG TABS Take 80 mg by mouth every morning. May Take additional 40 mg in the evening if you gain 3Lb or more   triamcinolone cream (KENALOG)  0.1 % Apply 1 application. topically daily as needed (itching).   No facility-administered encounter medications on file as of 08/26/2022.     SIGNIFICANT DIAGNOSTIC EXAMS  TODAY  08-22-22: glucose 98; bun 40; creat 1.42; k+ 4.0; na++ 141; ca 8.9; gfr 36 mag 2.4 08-26-22: wbc 9.8; hgb 9.8; hct 30.9 mcv 98.4 plt 190; glucose 96; bun 63; creat 1.55; k+ 3.4; na++ 139; ca 8.9; gfr 32;   Review of Systems  Constitutional:  Negative for malaise/fatigue.  Respiratory:  Negative for cough and shortness of breath.   Cardiovascular:  Negative for chest pain, palpitations and leg swelling.  Gastrointestinal:  Negative for abdominal pain, constipation and heartburn.  Musculoskeletal:  Negative for back pain, joint pain and myalgias.  Skin: Negative.   Neurological:  Negative for dizziness.  Psychiatric/Behavioral:  The patient is not nervous/anxious.    Physical Exam Constitutional:      General: She is not in acute distress.    Appearance: She is well-developed. She is not diaphoretic.  Neck:     Thyroid: No thyromegaly.  Cardiovascular:     Rate and Rhythm: Normal rate. Rhythm irregular.     Pulses: Normal pulses.     Heart sounds: Murmur heard.  Pulmonary:     Effort: Pulmonary effort is normal. No respiratory distress.     Breath sounds: Normal breath sounds.  Abdominal:     General: Bowel sounds are normal. There is no distension.     Palpations: Abdomen is soft.     Tenderness: There is no abdominal tenderness.  Musculoskeletal:        General:  Normal range of motion.     Cervical back: Neck supple.     Right lower leg: Edema present.     Left lower leg: Edema present.  Lymphadenopathy:     Cervical: No cervical adenopathy.  Skin:    General: Skin is warm and dry.  Neurological:     Mental Status: She is alert and oriented to person, place, and time.  Psychiatric:        Mood and Affect: Mood normal.       ASSESSMENT/ PLAN:  TODAY  Stage 3b chronic kidney disease: at this time will not make changes; will repeat labs on 08-31-22 and will make changes based upon those readings.  Anemia due to stage 3b chronic kidney disease: will monitor and will repeat labs on 08-31-22.    Synthia Innocent NP [facility]   call [phone]

## 2022-08-27 ENCOUNTER — Ambulatory Visit: Payer: Medicare PPO | Admitting: Internal Medicine

## 2022-08-27 ENCOUNTER — Encounter: Payer: Self-pay | Admitting: Cardiology

## 2022-08-27 ENCOUNTER — Encounter: Payer: Self-pay | Admitting: Internal Medicine

## 2022-08-27 ENCOUNTER — Ambulatory Visit: Payer: Medicare PPO | Attending: Cardiology | Admitting: Cardiology

## 2022-08-27 VITALS — BP 97/58 | HR 64 | Ht 60.0 in | Wt 155.6 lb

## 2022-08-27 VITALS — BP 108/50 | HR 76 | Ht 60.0 in | Wt 156.0 lb

## 2022-08-27 DIAGNOSIS — I5032 Chronic diastolic (congestive) heart failure: Secondary | ICD-10-CM

## 2022-08-27 DIAGNOSIS — I25119 Atherosclerotic heart disease of native coronary artery with unspecified angina pectoris: Secondary | ICD-10-CM

## 2022-08-27 DIAGNOSIS — Z79899 Other long term (current) drug therapy: Secondary | ICD-10-CM | POA: Diagnosis not present

## 2022-08-27 DIAGNOSIS — J9611 Chronic respiratory failure with hypoxia: Secondary | ICD-10-CM

## 2022-08-27 DIAGNOSIS — N1832 Chronic kidney disease, stage 3b: Secondary | ICD-10-CM | POA: Diagnosis not present

## 2022-08-27 DIAGNOSIS — I480 Paroxysmal atrial fibrillation: Secondary | ICD-10-CM

## 2022-08-27 DIAGNOSIS — R0609 Other forms of dyspnea: Secondary | ICD-10-CM

## 2022-08-27 MED ORDER — DAPAGLIFLOZIN PROPANEDIOL 10 MG PO TABS: 10.0000 mg | ORAL_TABLET | Freq: Every day | ORAL | 6 refills | Status: DC

## 2022-08-27 MED ORDER — TORSEMIDE 20 MG PO TABS: 60.0000 mg | ORAL_TABLET | Freq: Every day | ORAL | 3 refills | Status: DC

## 2022-08-27 MED ORDER — POTASSIUM CHLORIDE CRYS ER 20 MEQ PO TBCR: 20.0000 meq | EXTENDED_RELEASE_TABLET | Freq: Every day | ORAL | 3 refills | Status: DC

## 2022-08-27 NOTE — Assessment & Plan Note (Signed)
Onset was "with heart problems"  but improved p cabg / rehab  - never smoker - Echo 09/20/19 mod diastolic dysfunction  - PFT's  @ wt 170  11/07/14  FEV1 1.14 (69 % ) ratio 0.68  p 8 % improvement from saba p nothing prior to study with DLCO  10.72 (53%)   and FV curve mildly concave    - desat with walking 04/23/2022  see ex hypoxemia - PFT's @ wt 160  05/22/22  FEV1 0.79 (57 % ) ratio 0.61  p 11 % improvement from saba p ? prior to study with DLCO  8.36 (50%)   and FV curve min concave   - CTa2/25/24 c/w R>L effusion / atx  - Echo 07/09/22  1. Left ventricular ejection fraction, by estimation, is 50 to 55%. Left ventricular ejection fraction by 3D volume is 50 %. The left ventricle has low normal function.   2. Right ventricular systolic function is moderately reduced. The right ventricular size is moderately enlarged. There is moderately elevated pulmonary artery systolic pressure. The estimated right ventricular systolic pressure is 59.1 mmHg.  3. Left atrial size was severely dilated.  4. Right atrial size was severely dilated.  5. The mitral valve is normal in structure. Mild to moderate mitral valve regurgitation.  6. Tricuspid valve regurgitation is moderate to severe.  7.  Aortic valve regurgitation is mild.    8. The inferior vena cava is dilated in size with <50% respiratory variability, suggesting right atrial pressure of 15 mmHg.  She is well compensated at present at rest with the challenge to get her good enough to start walking and stay out of the hospital so I favor LHC/RHC to sort out whether or not this is all WHO 2 vs element of WHO 3 PH as well   >> f/u per cards planned / adjustments in diuretics per cards.

## 2022-08-27 NOTE — Assessment & Plan Note (Signed)
Rec titrate 02 to sats > 90% at all times to facilitate approp response to diuretics s compromising renal function.  F/u in 2 months, call sooner if needed         Each maintenance medication was reviewed in detail including emphasizing most importantly the difference between maintenance and prns and under what circumstances the prns are to be triggered using an action plan format where appropriate.  Total time for H and P, chart review, counseling, reviewing dpi/ 02  device(s) and generating customized AVS unique to this office visit / same day charting  > 30 min post hosp f/u ov

## 2022-08-27 NOTE — Patient Instructions (Signed)
Medication Instructions:   STOP Amlodipine  DECREASE Torsemide to 60 mg daily  Take Potassium 20 meq Daily  START Farxiga  10 mg daily   Labwork: BMET Friday 09/04/22  Testing/Procedures: None today  Follow-Up: 09/04/22  Any Other Special Instructions Will Be Listed Below (If Applicable).  If you need a refill on your cardiac medications before your next appointment, please call your pharmacy.

## 2022-08-27 NOTE — Patient Instructions (Signed)
[employer] for 02 saturations is 90% or greater   Please schedule a follow up visit in 2  months but call sooner if needed

## 2022-08-27 NOTE — Progress Notes (Signed)
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 Etiology  with prior pfts 2016 showing mild aiflow obst not better on saba.  Pt unsure of baseline wt   Reported sob only with severe exertion at pfts 2016 @ wt 170    History of Present Illness  04/23/2022  Pulmonary/ 1st office eval/ Ladora Osterberg / [facility] Office  Chief Complaint  Patient presents with   Consult    SOB- O2 desaturations.  Patient was 87% room air after walking from lobby to room but stated she did not want to be on O2   Dyspnea:  100 ft / can still push cart at [employer]/ mb is 75 ft slt down to it.  Cough: started one day prior to OV  / minimal mucoid not noct  Sleep: flat in bed one pillow ok  SABA use: once a day but very poor technique  02: does not use it  Rec Stop lopressor and start biosoprolol 10 mg daily to see what difference this makes Work on inhaler technique:  Only use your albuterol as a rescue medication  Also  Ok to try albuterol 15 min before an activity (on alternating days)  that you know would usually make you short of breath     - PFT's  05/22/22  FEV1 0.79 (57 % ) ratio 0.61  p 11 % improvement from saba p ? prior to study with DLCO  8.36 (50%)   and FV curve min concave     06/17/2022  f/u ov/[place] office/Erisha Paugh re: chronic asthma/ ex desats  maint on Breo 100 daily   Chief Complaint  Patient presents with   Follow-up    06/14/2022 For breathing and leg swelling  Have been titrating O2 to keep sats above 90%  Breathing is worse   Dyspnea:  mostly housebound / sob to bathroom/ not checking sats with activity  Cough: congestion back of throat / no significant production  Sleeping: slept in recliner one night prior @ 30 degrees  SABA use: not really using  hfa  02: 24/7 at 3lpm  Covid status: vax max/ neve infected   Rec Make sure you check your oxygen saturation  AT  your highest level of activity (not after you stop)   to be sure it stays over 90%   Elevate legs when sleeping and and as much as you can during the day  Trelegy [id number] one click each am x 2 weeks then return to clinic   If worse breathing or can't keep sats over 90% at rest on max 02 > go to [place]    07/02/2022  f/u ov/[place] office/Isaac Lacson re: bilateral effusions/ chronic 02 resp failure maint on trelegy 100   Chief Complaint  Patient presents with   Follow-up    Echo scheduled next week  Breathing about the same    Dyspnea:  on 02 3lpm room to room is all she can do   Cough: cough/ tickle / squeaky voice  Sleeping: sleeping in lift 30 degrees  SABA use: none 02: 3lpm  Rec Doxycycline 100 mg twice daily x 10 days for cellulitis         07/15/2022  f/u ov/ office/Marlene Beidler re: bilateral effusion mod chronic asthma  maint on Trelegy 100  Chief Complaint  Patient presents with   Follow-up    Echo results- would like to discuss   Dyspnea:  3lpm getting around the house better since prior ov  Cough: none  Sleeping: lift chair 30 degrees SABA use: none 02: 3lpm 24/7  Rec No change in recommendations  Be sure to adjust 02 if needed to keep sats > 90%  Will need to get cxr before next visit with cardiology  Discuss with Dr Diona Browner possibility of right heart Cath to determine pressures on R vs Left side of heart that are likely the cause of your pleural fluid.   Admission date:  08/18/2022    Discharge Date:  08/23/2022    Acute on chronic diastolic (congestive) heart failure (HCC)   Essential hypertension, benign   Automatic implantable cardioverter-defibrillator in situ   Chronic kidney disease, stage III (moderate) (HCC)   Chronic atrial fibrillation (HCC)   Chronic obstructive pulmonary disease, unspecified (HCC)   Chronic respiratory failure with hypoxia (HCC)        Chief Complaint: Difficulty Breathing,  Leg swelling    HPI: [name] TANCREDI is a 89 y.o. female with medical history significant for  Diastolic CHF, CKD 3b-4, CABG- 1610, Atria fibrillation on anti-coag, chronic respiratory failure on 3 L. Patient was sent to the ED from outpatient cardiology office for diuresis.  Patient has had ongoing bilateral lower extremity swelling, and difficulty breathing with exertion.  Since January this year, patient has put on about 16 pounds-baseline weight of about 154-179.  With ambulation just 10 to 15 feet at home, patient's O2 sats on 3 L dropped to 80s.  No chest pain. She has been compliant with torsemide 80 mg every other day alternating with 60 mg, and metolazone 2.5 was recently added.  Still with no improvement in edema.  Brief Narrative:  89 y.o. female with medical history significant for  Diastolic CHF, CKD 3b-4, CABG- 9604, Atria fibrillation on anti-coag, chronic respiratory failure on 3 L admitted with acute diastolic CHF exacerbation on 08/18/2022   -Assessment and Plan: 1)Acute on chronic diastolic (congestive) heart failure (HCC) -Admit to with weight gain, no extremity edema, elevated BNP, chest x-ray findings of pleural effusions and worsening shortness of breath after failing oral diuretics at home  -Recent echo-EF of 50 to 55%, RV pressure 59.1.   -PTA patient was on torsemide daily (alternating between 60 and 80 mg every daily), and metolazone 2.5 mg   -Cardiology consult appreciated -Diuresed really well with IV Lasix -Weight is down to 154 pounds from 165 pounds -REDs Vest is down to 36 from 38 yesterday -Fluid balance remains negative -Dyspnea has improved significantly -Cardiologist recommends discharge on Demadex 80 mg daily, patient may take additional 40 mg if gains more than 3 pounds, take metolazone 2.5 mg and potassium 20 meq qTuesday -Repeat chest x-ray on 08/23/2022 noted small bilateral pleural effusions left greater than right and findings of possible bronchitis (Demadex  and doxycycline as prescribed) -Outpatient follow-up with cardiologist Dr. Diona Browner in about 3 weeks advised -     2)Chronic obstructive pulmonary disease, unspecified (HCC) -Currently requiring 3 L per nasal cannula  -at baseline uses 3 L per nasal cannula -Doxycycline as above for presumed bronchitis -Please see #1 above   3)Chronic atrial fibrillation/history of V. tach -status post AICD placement Stable.  -Continue Tikosyn, bisoprolol, Eliquis -Follow-up with Dr. Ladona Ridgel as outpatient   4)CKD stage -3B -Creatinine improved with diuresis -Avoid NSAIDs -Repeat BMP on Wednesday, 08/26/2022  5)HTN- Stable. C/n  bisoprolol 10 mg,  losartan 100 mg,, amlodipine 2.5 mg daily -Transitioned from IV Lasix to p.o. Demadex as above #1   6)CAD--- chest pain-free -s/p CABG in 1998 with LIMA-LAD, SVG-Diagonal, SVG-RI-OM, DES to PLA in 2006 and she did have a low risk NST in 09/2019.  Continue atorvastatin 20 mg daily and  Bisoprolol -No aspirin as patient is already on Eliquis   7)Generalized weakness and Deconditioning----PT eval appreciated -Discharge to SNF in relatively stable condition   08/27/2022  f/u ov/[place] office/Virdia Ziesmer re: 02 dep/ chf with ? Chronic asthma maint on trelegy   Chief Complaint  Patient presents with   Follow-up   Dyspnea:  not walking hallways yet  Cough: none  Sleeping: slt elevated at [facility]  SABA use: none 02: 3lpm 24     No obvious day to day or daytime variability or assoc excess/ purulent sputum or mucus plugs or hemoptysis or cp or chest tightness, subjective wheeze or overt sinus or hb symptoms.   Sleeping  without nocturnal  or early am exacerbation  of respiratory  c/o's or need for noct saba. Also denies any obvious fluctuation of symptoms with weather or environmental changes or other aggravating or alleviating factors except as outlined above   No unusual exposure hx or h/o childhood pna/ asthma or knowledge of premature  birth.  Current Allergies, Complete Past Medical History, Past Surgical History, Family History, and Social History were reviewed in [employer] record.  ROS  The following are not active complaints unless bolded Hoarseness, sore throat, dysphagia, dental problems, itching, sneezing,  nasal congestion or discharge of excess mucus or purulent secretions, ear ache,   fever, chills, sweats, unintended wt loss or wt gain, classically pleuritic or exertional cp,  orthopnea pnd or arm/hand swelling  or leg swelling improved p admit , presyncope, palpitations, abdominal pain, anorexia, nausea, vomiting, diarrhea  or change in bowel habits or change in bladder habits, change in stools or change in urine, dysuria, hematuria,  rash, arthralgias, visual complaints, headache, numbness, weakness or ataxia or problems with walking or coordination,  change in mood or  memory.        Current Meds  Medication Sig   acetaminophen (TYLENOL) 325 MG tablet Take 2 tablets (650 mg total) by mouth every 6 (six) hours as needed for mild pain (or Fever >/= 101).   albuterol (VENTOLIN HFA) 108 (90 Base) MCG/ACT inhaler Inhale 2 puffs into the lungs every 6 (six) hours as needed for wheezing or shortness of breath.   amLODipine (NORVASC) 2.5 MG tablet Take 1 tablet (2.5 mg total) by mouth daily.   apixaban (ELIQUIS) 2.5 MG TABS tablet Take 1 tablet (2.5 mg total) by mouth 2 (two) times daily.   atorvastatin (LIPITOR) 20 MG tablet Take 1 tablet (20 mg total) by mouth daily.   bisoprolol (ZEBETA) 5 MG tablet Take 1 tablet (5 mg total) by mouth daily.   Calcium Carbonate-Vitamin D (CALCIUM 600 + D PO) Take 1 tablet by mouth 2 (two) times daily.   Cholecalciferol (VITAMIN D) 2000 UNITS CAPS Take 2,000 Units by mouth at bedtime.   dofetilide (TIKOSYN) 125 MCG capsule Take 1 capsule (125 mcg total) by mouth 2 (two) times daily.   doxycycline (VIBRA-TABS) 100 MG tablet Take 1 tablet (100 mg total) by mouth 2  (two) times daily for 5 days.   Fluticasone-Umeclidin-Vilant (TRELEGY ELLIPTA) 100-62.5-25 MCG/ACT AEPB Inhale 1 puff into the lungs daily.   folic  acid (FOLVITE) 1 MG tablet Take 1 tablet (1 mg total) by mouth daily.   losartan (COZAAR) 100 MG tablet Take 1 tablet (100 mg total) by mouth daily.   metolazone (ZAROXOLYN) 2.5 MG tablet Take 1 tablet (2.5 mg total) by mouth every Tuesday. Take  30 minutes prior to Torsemide   Polyethyl Glycol-Propyl Glycol (SYSTANE OP) Place 1 drop into both eyes daily as needed (Dry eyes).   polyethylene glycol (MIRALAX / GLYCOLAX) 17 g packet Take 17 g by mouth daily as needed for mild constipation.   potassium chloride SA (KLOR-CON M) 20 MEQ tablet Take 1 tablet (20 mEq total) by mouth every Tuesday.   torsemide 40 MG TABS Take 80 mg by mouth every morning. May Take additional 40 mg in the evening if you gain 3Lb or more   triamcinolone cream (KENALOG) 0.1 % Apply 1 application. topically daily as needed (itching).              Past Medical History:  Diagnosis Date   Anxiety    Chronic atrial fibrillation (HCC)    Chronic back pain    Chronic diastolic heart failure (HCC)    Chronic obstructive pulmonary disease, unspecified (HCC)    Complete heart block (HCC)    COPD (chronic obstructive pulmonary disease) (HCC)    Coronary atherosclerosis of native coronary artery    Multivessel status post CABG, DES PLA March 2006   DDD (degenerative disc disease), lumbar    Essential hypertension    Headache    ICD (implantable cardioverter-defibrillator) in place    Mixed hyperlipidemia    Osteoarthritis    Ventricular fibrillation (HCC) 2003   a. seen on PPM interrogation a/w syncope       Objective:    Wts  08/27/2022         155   07/15/2022       164 07/02/2022       163   06/17/22 163 lb 6.4 oz (74.1 kg)  06/14/22 160 lb (72.6 kg)  05/22/22 160 lb (72.6 kg)     Vital signs reviewed  08/27/2022  - Note at rest 02 sats  91% on POC    General  appearance:    w/c bound pleasant wf nad          HEENT : Oropharynx  clear      NECK :  without  apparent JVD/ palpable Nodes/TM    LUNGS: no acc muscle use,  Min barrel / kyphotic contour chest wall with bilateral  basilar  decreased bs s audible wheeze and  without cough on insp or exp maneuvers     CV:  RRR  no s3   2/6 HS with slt  increase in P2, and trace pedal  edema bilaterally   ABD:  soft and nontender  MS: ext warm without deformities Or obvious joint restrictions  calf tenderness, cyanosis or clubbing     SKIN: warm and dry without lesions    NEURO:  alert, approp, nl sensorium with  no motor or cerebellar deficits apparent.          I personally reviewed images and agree with radiology impression as follows:  CXR:   Pa and lateral 08/23/22  1. Persistent bibasilar areas of atelectasis and/or consolidation with superimposed small bilateral pleural effusions (left-greater-than-right). 2. Diffuse interstitial prominence and peribronchial cuffing, concerning for an acute bronchitis. 3. Mild cardiomegaly. 4. Aortic atherosclerosis.    Assessment

## 2022-08-28 DIAGNOSIS — N183 Chronic kidney disease, stage 3 unspecified: Secondary | ICD-10-CM | POA: Insufficient documentation

## 2022-08-28 DIAGNOSIS — D631 Anemia in chronic kidney disease: Secondary | ICD-10-CM | POA: Insufficient documentation

## 2022-08-31 ENCOUNTER — Other Ambulatory Visit (HOSPITAL_COMMUNITY)
Admission: RE | Admit: 2022-08-31 | Discharge: 2022-08-31 | Disposition: A | Discharge status: Home / Self Care | Payer: Medicare PPO | Source: Skilled Nursing Facility | Attending: Adult Health | Admitting: Adult Health

## 2022-08-31 ENCOUNTER — Non-Acute Institutional Stay (SKILLED_NURSING_FACILITY): Payer: Medicare PPO | Admitting: Adult Health

## 2022-08-31 ENCOUNTER — Encounter: Payer: Self-pay | Admitting: Adult Health

## 2022-08-31 DIAGNOSIS — N1832 Chronic kidney disease, stage 3b: Secondary | ICD-10-CM | POA: Diagnosis not present

## 2022-08-31 DIAGNOSIS — I5033 Acute on chronic diastolic (congestive) heart failure: Secondary | ICD-10-CM | POA: Insufficient documentation

## 2022-08-31 LAB — COMPREHENSIVE METABOLIC PANEL
ALT: 17 U/L (ref 0–44)
AST: 23 U/L (ref 15–41)
Albumin: 3.5 g/dL (ref 3.5–5.0)
Alkaline Phosphatase: 56 U/L (ref 38–126)
Anion gap: 11 (ref 5–15)
BUN: 78 mg/dL — ABNORMAL HIGH (ref 8–23)
CO2: 30 mmol/L (ref 22–32)
Calcium: 9.4 mg/dL (ref 8.9–10.3)
Chloride: 98 mmol/L (ref 98–111)
Creatinine, Ser: 1.98 mg/dL — ABNORMAL HIGH (ref 0.44–1.00)
GFR, Estimated: 24 mL/min — ABNORMAL LOW (ref >60)
Glucose, Bld: 107 mg/dL — ABNORMAL HIGH (ref 70–99)
Potassium: 4.1 mmol/L (ref 3.5–5.1)
Sodium: 139 mmol/L (ref 135–145)
Total Bilirubin: 0.9 mg/dL (ref 0.3–1.2)
Total Protein: 6.3 g/dL — ABNORMAL LOW (ref 6.5–8.1)

## 2022-08-31 LAB — HEMOGLOBIN AND HEMATOCRIT, BLOOD
HCT: 30.5 % — ABNORMAL LOW (ref 36.0–46.0)
Hemoglobin: 9.8 g/dL — ABNORMAL LOW (ref 12.0–15.0)

## 2022-08-31 NOTE — Progress Notes (Signed)
Location:  [facility] Room Number: 142 Place of Service:   SNF    CODE STATUS: full code   Allergies  Allergen Reactions   Codeine Nausea And Vomiting   Fentanyl Itching    Itching, redness to scalp and neck   Keflex [Cephalexin] Itching and Rash    Chief Complaint  Patient presents with   Acute Visit    Follow up labs     HPI:  She has been started on farxiga 10 mg daily to protect her renal function. Since starting this dose her creat has increased to 1.98. she is tolerating this medication without any side effects at this time.   Past Medical History:  Diagnosis Date   Anxiety    Asthma    Chronic atrial fibrillation (HCC)    Chronic back pain    Chronic diastolic heart failure (HCC)    Chronic obstructive pulmonary disease, unspecified (HCC)    Complete heart block (HCC)    COPD (chronic obstructive pulmonary disease) (HCC)    Coronary atherosclerosis of native coronary artery    Multivessel status post CABG, DES PLA March 2006   DDD (degenerative disc disease), lumbar    Essential hypertension    Headache    ICD (implantable cardioverter-defibrillator) in place    Mixed hyperlipidemia    Osteoarthritis    Ventricular fibrillation (HCC) 2003   a. seen on PPM interrogation a/w syncope    Past Surgical History:  Procedure Laterality Date   CARDIAC DEFIBRILLATOR PLACEMENT     MDT dual chamber ICD   CARDIOVERSION N/A 08/09/2014   Procedure: CARDIOVERSION;  Surgeon: Thurmon Fair, MD;  Location: [facility] ENDOSCOPY;  Service: Cardiovascular;  Laterality: N/A;   CORONARY ARTERY BYPASS GRAFT     LIMA to LAD, SVG to diagonal, SVG to ramus and OM   ICD GENERATOR CHANGEOUT N/A 08/06/2021   Procedure: ICD GENERATOR CHANGEOUT;  Surgeon: Marinus Maw, MD;  Location: [facility] INVASIVE CV LAB;  Service: Cardiovascular;  Laterality: N/A;   IMPLANTABLE CARDIOVERTER DEFIBRILLATOR GENERATOR CHANGE N/A 09/25/2013   Procedure: IMPLANTABLE CARDIOVERTER  DEFIBRILLATOR GENERATOR CHANGE;  Surgeon: Marinus Maw, MD;  Location: [facility] CATH LAB;  Service: Cardiovascular;  Laterality: N/A;   LEAD REVISION N/A 09/29/2013   Procedure: LEAD REVISION;  Surgeon: Duke Salvia, MD;  Location: [facility] CATH LAB;  Service: Cardiovascular;  Laterality: N/A;   TONSILLECTOMY     YAG LASER APPLICATION Left 12/27/2012   Procedure: YAG LASER APPLICATION;  Surgeon: Loraine Leriche T. Nile Riggs, MD;  Location: [facility] ORS;  Service: Ophthalmology;  Laterality: Left;   YAG LASER APPLICATION Right 01/10/2013   Procedure: YAG LASER APPLICATION;  Surgeon: Loraine Leriche T. Nile Riggs, MD;  Location: [facility] ORS;  Service: Ophthalmology;  Laterality: Right;    Social History   Socioeconomic History   Marital status: Widowed    Spouse name: Not on file   Number of children: 2   Years of education: Not on file   Highest education level: Not on file  Occupational History   Occupation: Retired    Comment: Copywriter, advertising: RETIRED  Tobacco Use   Smoking status: Never   Smokeless tobacco: Never  Vaping Use   Vaping Use: Never used  Substance and Sexual Activity   Alcohol use: No    Alcohol/week: 0.0 standard drinks of alcohol   Drug use: No   Sexual activity: Never  Other Topics Concern   Not on file  Social History Narrative  Not on file   Social Determinants of Health   Financial Resource Strain: Not on file  Food Insecurity: No Food Insecurity (08/18/2022)   Hunger Vital Sign    Worried About Running Out of Food in the Last Year: Never true    Ran Out of Food in the Last Year: Never true  Transportation Needs: No Transportation Needs (08/18/2022)   PRAPARE - Administrator, Civil Service (Medical): No    Lack of Transportation (Non-Medical): No  Physical Activity: Not on file  Stress: Not on file  Social Connections: Not on file  Intimate Partner Violence: Not At Risk (08/18/2022)   Humiliation, Afraid, Rape, and Kick questionnaire    Fear of Current or Ex-Partner: No     Emotionally Abused: No    Physically Abused: No    Sexually Abused: No   Family History  Problem Relation Age of Onset   Heart attack Father    Heart attack Brother       VITAL SIGNS BP (!) 116/52   Pulse 68   Temp 98.3 F (36.8 C)   Resp 20   Ht 5' (1.524 m)   Wt 157 lb 9.6 oz (71.5 kg)   SpO2 98%   BMI 30.78 kg/m   Outpatient Encounter Medications as of 08/31/2022  Medication Sig   acetaminophen (TYLENOL) 325 MG tablet Take 2 tablets (650 mg total) by mouth every 6 (six) hours as needed for mild pain (or Fever >/= 101).   albuterol (VENTOLIN HFA) 108 (90 Base) MCG/ACT inhaler Inhale 2 puffs into the lungs every 6 (six) hours as needed for wheezing or shortness of breath.   apixaban (ELIQUIS) 2.5 MG TABS tablet Take 1 tablet (2.5 mg total) by mouth 2 (two) times daily.   atorvastatin (LIPITOR) 20 MG tablet Take 1 tablet (20 mg total) by mouth daily.   bisoprolol (ZEBETA) 5 MG tablet Take 1 tablet (5 mg total) by mouth daily.   Calcium Carbonate-Vitamin D (CALCIUM 600 + D PO) Take 1 tablet by mouth 2 (two) times daily.   Cholecalciferol (VITAMIN D) 2000 UNITS CAPS Take 2,000 Units by mouth at bedtime.   dapagliflozin propanediol (FARXIGA) 10 MG TABS tablet Take 1 tablet (10 mg total) by mouth daily before breakfast.   dofetilide (TIKOSYN) 125 MCG capsule Take 1 capsule (125 mcg total) by mouth 2 (two) times daily.   Fluticasone-Umeclidin-Vilant (TRELEGY ELLIPTA) 100-62.5-25 MCG/ACT AEPB Inhale 1 puff into the lungs daily.   folic acid (FOLVITE) 1 MG tablet Take 1 tablet (1 mg total) by mouth daily.   losartan (COZAAR) 100 MG tablet Take 1 tablet (100 mg total) by mouth daily.   metolazone (ZAROXOLYN) 2.5 MG tablet Take 1 tablet (2.5 mg total) by mouth every Tuesday. Take  30 minutes prior to Torsemide   Polyethyl Glycol-Propyl Glycol (SYSTANE OP) Place 1 drop into both eyes daily as needed (Dry eyes).   polyethylene glycol (MIRALAX / GLYCOLAX) 17 g packet Take 17 g by mouth  daily as needed for mild constipation.   potassium chloride SA (KLOR-CON M20) 20 MEQ tablet Take 1 tablet (20 mEq total) by mouth daily.   torsemide (DEMADEX) 20 MG tablet Take 3 tablets (60 mg total) by mouth daily.   triamcinolone cream (KENALOG) 0.1 % Apply 1 application. topically daily as needed (itching).   No facility-administered encounter medications on file as of 08/31/2022.     SIGNIFICANT DIAGNOSTIC EXAMS  PREVIOUS   08-22-22: glucose 98; bun 40; creat 1.42;  k+ 4.0; na++ 141; ca 8.9; gfr 36 mag 2.4 08-26-22: wbc 9.8; hgb 9.8; hct 30.9 mcv 98.4 plt 190; glucose 96; bun 63; creat 1.55; k+ 3.4; na++ 139; ca 8.9; gfr 32;  TODAY  08-31-22: hgb 9.8; hct 30.5; glucose 107; bun 78; creat 1.98; k+ 4.1; na++ 139; ca 9.4 gfr 24 protein 6.3 albumin 3.5   Review of Systems  Constitutional:  Negative for malaise/fatigue.  Respiratory:  Negative for cough and shortness of breath.   Cardiovascular:  Negative for chest pain, palpitations and leg swelling.  Gastrointestinal:  Negative for abdominal pain, constipation and heartburn.  Musculoskeletal:  Negative for back pain, joint pain and myalgias.  Skin: Negative.   Neurological:  Negative for dizziness.  Psychiatric/Behavioral:  The patient is not nervous/anxious.    Physical Exam Constitutional:      General: She is not in acute distress.    Appearance: She is well-developed. She is obese. She is not diaphoretic.  Neck:     Thyroid: No thyromegaly.  Cardiovascular:     Rate and Rhythm: Normal rate and regular rhythm.     Pulses: Normal pulses.     Heart sounds: Normal heart sounds.  Pulmonary:     Effort: Pulmonary effort is normal. No respiratory distress.     Breath sounds: Normal breath sounds.  Abdominal:     General: Bowel sounds are normal. There is no distension.     Palpations: Abdomen is soft.     Tenderness: There is no abdominal tenderness.  Musculoskeletal:        General: Normal range of motion.     Cervical back:  Neck supple.     Right lower leg: No edema.     Left lower leg: No edema.  Lymphadenopathy:     Cervical: No cervical adenopathy.  Skin:    General: Skin is warm and dry.  Neurological:     Mental Status: She is alert. Mental status is at baseline.  Psychiatric:        Mood and Affect: Mood normal.      ASSESSMENT/ PLAN:  TODAY  Stage 3b chronic kidney disease: is slightly worse; will lower her farxiga to 5 mg daily and will monitor her status.    Synthia Innocent NP [facility]   call [phone]

## 2022-09-03 NOTE — Progress Notes (Signed)
Cardiology Office Note    Date:  09/04/2022  ID:  [name] [name], DOB 01/01/1932, MRN [id number] Cardiologist: Nona Dell, MD    History of Present Illness:    [name] [name] is a 89 y.o. female with past medical history of CAD (s/p CABG in 1998 with LIMA-LAD, SVG-Diagonal, SVG-RI-OM, DES to PLA in 2006, low risk NST in 09/2019), HFpEF, chronic hypoxic respiratory failure, chronic obstructive asthma, paroxysmal atrial fibrillation, history of VT (s/p ICD placement), HTN, HLD and Stage 3 CKD who presents to the office today for 1 week follow-up.  She was last examined Dr. Diona Browner on 08/27/2022 following a recent hospitalization for an acute CHF exacerbation. Was taking Torsemide 80 mg daily with an additional 40 mg as needed and reported that her breathing had overall improved but she was still requiring supplemental oxygen. It was still recommended for her to follow-up with the [facility] for discussion regarding her pulmonary hypertension but it was felt that medical therapy would likely be the focus as compared to pursuing RHC. Torsemide was reduced to 60 mg daily with her being started on Farxiga 10 mg daily. Amlodipine was also discontinued given her soft BP.  In talking with the patient and her son today, she is still residing at the [facility] but is possibly looking to move to [facility]. She is still working with physical therapy. Says that her breathing has overall been stable and she is hopeful her oxygen requirement can be reduced. Denies any specific orthopnea or PND. No recent chest pain or palpitations. Her lower extremity edema has been stable. She is worried about her worsening renal function as creatinine is now at 2.26 by repeat labs this morning. In reviewing her medications from SNF, she has been receiving Metolazone every Tuesday and has been on Torsemide 60 mg daily.  Studies Reviewed:   EKG: EKG is not ordered today.  Echocardiogram:  06/2022 IMPRESSIONS     1. Left ventricular ejection fraction, by estimation, is 50 to 55%. Left  ventricular ejection fraction by 3D volume is 50 %. The left ventricle has  low normal function. The left ventricle has no regional wall motion  abnormalities. Left ventricular  diastolic function could not be evaluated.   2. Right ventricular systolic function is moderately reduced. The right  ventricular size is moderately enlarged. There is moderately elevated  pulmonary artery systolic pressure. The estimated right ventricular  systolic pressure is 59.1 mmHg.   3. Left atrial size was severely dilated.   4. Right atrial size was severely dilated.   5. The mitral valve is normal in structure. Mild to moderate mitral valve  regurgitation.   6. Tricuspid valve regurgitation is moderate to severe.   7. The aortic valve is tricuspid. There is mild calcification of the  aortic valve. There is mild thickening of the aortic valve. Aortic valve  regurgitation is mild. Aortic valve sclerosis is present, with no evidence  of aortic valve stenosis.   8. The inferior vena cava is dilated in size with <50% respiratory  variability, suggesting right atrial pressure of 15 mmHg.   Comparison(s): Prior images reviewed side by side. Tricuspid regurgitation  is much worse and right ventricular dilation and systolic dysfunction is  now present.   Physical Exam:   VS:  BP (!) 116/58   Pulse 66   Ht 5' (1.524 m)   Wt 155 lb (70.3 kg)   SpO2 98%   BMI 30.27 kg/m  Wt Readings from Last 3 Encounters:  09/04/22 155 lb (70.3 kg)  09/04/22 156 lb 3.2 oz (70.9 kg)  08/31/22 157 lb 9.6 oz (71.5 kg)     GEN: Pleasant, elderly female appearing in NAD. Sitting in wheelchair.  NECK: No JVD; No carotid bruits CARDIAC: RRR, 2/6 SEM along RUSB.  RESPIRATORY:  Clear to auscultation without rales, wheezing or rhonchi  ABDOMEN: Appears non-distended. No obvious abdominal masses. EXTREMITIES: No clubbing or  cyanosis. Trace lower extremity edema.  Distal pedal pulses are 2+ bilaterally.   Assessment and Plan:   1. HFpEF/Pulmonary HTN - Her volume status has significantly improved since her hospitalization and weight has been stable. Given her worsening renal function, will reduce Torsemide from 60 mg daily to 40 mg daily and also stop once weekly Metolazone as she has been receiving this while at SNF. For unclear reasons, Marcelline Deist was reduced from 10 mg daily to 5 mg daily and given the correct dose of this for CHF is 10mg  daily, this will be readjusted. Will recheck a BMET and BNP early next week. If renal function does not improve with the following adjustments, we may need to stop [place]. - Felt to have Group 2/3 Pulmonary HTN and was previously referred to the [facility] in 07/2022 but a visit has not yet been scheduled. A staff message was sent today to follow-up on this.  2. CAD - She underwent CABG in 1998 with LIMA-LAD, SVG-Diagonal, SVG-RI-OM with DES to PLA in 2006. Most recent ischemic evaluation was a low-risk NST in 2021.  - She has baseline dyspnea on exertion but no chest pain. Continue Atorvastatin 20 mg daily and Bisoprolol 5 mg daily. She is not on ASA given the need for anticoagulation.  3. Paroxysmal Atrial Fibrillation - Device interrogation 07/2022 showed atrial flutter approximately 0.8% of the time. She remains on Tikosyn 125 mcg twice daily along with Bisoprolol 5 mg daily. Will stop Metolazone as discussed above given her worsening renal function and also to avoid electrolyte abnormalities given the use of Tikosyn. - She is on Eliquis 2.5 mg twice daily for anticoagulation which is the appropriate dose at this time given her age of 81 and current renal function.   4. History of VT - Followed by Dr. Ladona Ridgel. Device interrogation last month showed normal device function.  5. HTN - Her blood pressure is well-controlled at 116/58 during today's visit. Continue  Bisoprolol 5 mg daily and Losartan 100 mg daily.  6. HLD - LDL was at 58 when checked in 02/2022. Continue current medical therapy with Atorvastatin 20 mg daily.  7. Stage 3 CKD - Baseline creatinine 1.4 - 1.5. Elevated to 1.98 on 08/31/2022 and at 2.26 today. Will stop once weekly Metolazone as it appears she has been receiving this at SNF. Will also reduce Torsemide from 60 mg daily to 40 mg daily and recheck labs next Tuesday or Wednesday.  Signed, Ellsworth Lennox, PA-C

## 2022-09-04 ENCOUNTER — Ambulatory Visit: Payer: Medicare PPO | Admitting: Student

## 2022-09-04 ENCOUNTER — Encounter: Payer: Self-pay | Admitting: Student

## 2022-09-04 ENCOUNTER — Other Ambulatory Visit
Admission: RE | Admit: 2022-09-04 | Discharge: 2022-09-04 | Disposition: A | Discharge status: Home / Self Care | Payer: Medicare PPO | Source: Skilled Nursing Facility | Attending: Student | Admitting: Student

## 2022-09-04 ENCOUNTER — Encounter: Payer: Self-pay | Admitting: Adult Health

## 2022-09-04 ENCOUNTER — Non-Acute Institutional Stay (SKILLED_NURSING_FACILITY): Payer: Medicare PPO | Admitting: Adult Health

## 2022-09-04 VITALS — BP 116/58 | HR 66 | Ht 60.0 in | Wt 155.0 lb

## 2022-09-04 DIAGNOSIS — N1832 Chronic kidney disease, stage 3b: Secondary | ICD-10-CM | POA: Diagnosis not present

## 2022-09-04 DIAGNOSIS — J9611 Chronic respiratory failure with hypoxia: Secondary | ICD-10-CM | POA: Diagnosis not present

## 2022-09-04 DIAGNOSIS — I480 Paroxysmal atrial fibrillation: Secondary | ICD-10-CM | POA: Insufficient documentation

## 2022-09-04 DIAGNOSIS — I482 Chronic atrial fibrillation, unspecified: Secondary | ICD-10-CM | POA: Diagnosis not present

## 2022-09-04 DIAGNOSIS — Z7901 Long term (current) use of anticoagulants: Secondary | ICD-10-CM | POA: Diagnosis not present

## 2022-09-04 DIAGNOSIS — I5032 Chronic diastolic (congestive) heart failure: Secondary | ICD-10-CM | POA: Diagnosis not present

## 2022-09-04 DIAGNOSIS — I4729 Other ventricular tachycardia: Secondary | ICD-10-CM

## 2022-09-04 DIAGNOSIS — Z79899 Other long term (current) drug therapy: Secondary | ICD-10-CM | POA: Insufficient documentation

## 2022-09-04 DIAGNOSIS — I083 Combined rheumatic disorders of mitral, aortic and tricuspid valves: Secondary | ICD-10-CM | POA: Insufficient documentation

## 2022-09-04 DIAGNOSIS — N183 Chronic kidney disease, stage 3 unspecified: Secondary | ICD-10-CM | POA: Insufficient documentation

## 2022-09-04 DIAGNOSIS — I4892 Unspecified atrial flutter: Secondary | ICD-10-CM | POA: Diagnosis not present

## 2022-09-04 DIAGNOSIS — Z95810 Presence of automatic (implantable) cardiac defibrillator: Secondary | ICD-10-CM | POA: Diagnosis not present

## 2022-09-04 DIAGNOSIS — D631 Anemia in chronic kidney disease: Secondary | ICD-10-CM | POA: Insufficient documentation

## 2022-09-04 DIAGNOSIS — E785 Hyperlipidemia, unspecified: Secondary | ICD-10-CM | POA: Diagnosis not present

## 2022-09-04 DIAGNOSIS — I10 Essential (primary) hypertension: Secondary | ICD-10-CM | POA: Diagnosis not present

## 2022-09-04 DIAGNOSIS — I272 Pulmonary hypertension, unspecified: Secondary | ICD-10-CM | POA: Diagnosis not present

## 2022-09-04 DIAGNOSIS — Z951 Presence of aortocoronary bypass graft: Secondary | ICD-10-CM | POA: Insufficient documentation

## 2022-09-04 DIAGNOSIS — I2510 Atherosclerotic heart disease of native coronary artery without angina pectoris: Secondary | ICD-10-CM

## 2022-09-04 DIAGNOSIS — Z955 Presence of coronary angioplasty implant and graft: Secondary | ICD-10-CM | POA: Diagnosis not present

## 2022-09-04 DIAGNOSIS — I130 Hypertensive heart and chronic kidney disease with heart failure and stage 1 through stage 4 chronic kidney disease, or unspecified chronic kidney disease: Secondary | ICD-10-CM | POA: Insufficient documentation

## 2022-09-04 LAB — BASIC METABOLIC PANEL
Anion gap: 14 (ref 5–15)
BUN: 76 mg/dL — ABNORMAL HIGH (ref 8–23)
CO2: 29 mmol/L (ref 22–32)
Calcium: 9.1 mg/dL (ref 8.9–10.3)
Chloride: 94 mmol/L — ABNORMAL LOW (ref 98–111)
Creatinine, Ser: 2.26 mg/dL — ABNORMAL HIGH (ref 0.44–1.00)
GFR, Estimated: 20 mL/min — ABNORMAL LOW (ref >60)
Glucose, Bld: 74 mg/dL (ref 70–99)
Potassium: 3.7 mmol/L (ref 3.5–5.1)
Sodium: 137 mmol/L (ref 135–145)

## 2022-09-04 MED ORDER — TORSEMIDE 20 MG PO TABS: 40.0000 mg | ORAL_TABLET | Freq: Every day | ORAL | 3 refills | Status: DC

## 2022-09-04 NOTE — Progress Notes (Signed)
Remote ICD transmission.   

## 2022-09-04 NOTE — Patient Instructions (Addendum)
Medication Instructions:  STOP Metolazone  REDUCE Torsemide to 40 mg daily   Marcelline Deist should be 10 mg daily  Labwork: BMET,BNP next Tuesday or Wednesday 5/21 or 5/22  Testing/Procedures: None today  Follow-Up: 3-4 weeks  Any Other Special Instructions Will Be Listed Below (If Applicable).   Apply compression stockings  If you need a refill on your cardiac medications before your next appointment, please call your pharmacy.

## 2022-09-04 NOTE — Progress Notes (Signed)
Location:  [facility] Room Number: 143 Place of Service:  SNF (31)   CODE STATUS: FULL CODE  Allergies  Allergen Reactions   Codeine Nausea And Vomiting   Fentanyl Itching    Itching, redness to scalp and neck   Keflex [Cephalexin] Itching and Rash    Chief Complaint  Patient presents with   Acute Visit    Care plan meeting    HPI:  We have come together for her care plan meeting. Family present. BIMS 13/15 mood 7/30: not eating well; not sleeping well; nervous; some depression; decreased energy. She is ambulatory with therapy; no falls. She is supervision with adl care. She is occasionally incontinent of bladder and bowel. Dietary: regular diet with 1800 cc fluid restriction; good appetite; set up for meals; weight is 158 pounds. Therapy: stand/pivot: mod assist with 02 tubing; bed mobility: independent with rail; upper body supervision; lower body stand by assist; brp; stand by assist; ambulate 120 feet with rolling walker; mod I; car transfers: independent. 02 sats are stable. Activities: visits. She continues to be followed for her chronic illnesses including:  Chronic atrial fibrillation Chronic diastolic congestive heart failure   Chronic respiratory failure with hypoxia  Past Medical History:  Diagnosis Date   Anxiety    Asthma    Chronic atrial fibrillation (HCC)    Chronic back pain    Chronic diastolic heart failure (HCC)    Chronic obstructive pulmonary disease, unspecified (HCC)    Complete heart block (HCC)    COPD (chronic obstructive pulmonary disease) (HCC)    Coronary atherosclerosis of native coronary artery    Multivessel status post CABG, DES PLA March 2006   DDD (degenerative disc disease), lumbar    Essential hypertension    Headache    ICD (implantable cardioverter-defibrillator) in place    Mixed hyperlipidemia    Osteoarthritis    Ventricular fibrillation (HCC) 2003   a. seen on PPM interrogation a/w syncope    Past  Surgical History:  Procedure Laterality Date   CARDIAC DEFIBRILLATOR PLACEMENT     MDT dual chamber ICD   CARDIOVERSION N/A 08/09/2014   Procedure: CARDIOVERSION;  Surgeon: Thurmon Fair, MD;  Location: [facility] ENDOSCOPY;  Service: Cardiovascular;  Laterality: N/A;   CORONARY ARTERY BYPASS GRAFT     LIMA to LAD, SVG to diagonal, SVG to ramus and OM   ICD GENERATOR CHANGEOUT N/A 08/06/2021   Procedure: ICD GENERATOR CHANGEOUT;  Surgeon: Marinus Maw, MD;  Location: [facility] INVASIVE CV LAB;  Service: Cardiovascular;  Laterality: N/A;   IMPLANTABLE CARDIOVERTER DEFIBRILLATOR GENERATOR CHANGE N/A 09/25/2013   Procedure: IMPLANTABLE CARDIOVERTER DEFIBRILLATOR GENERATOR CHANGE;  Surgeon: Marinus Maw, MD;  Location: [facility] CATH LAB;  Service: Cardiovascular;  Laterality: N/A;   LEAD REVISION N/A 09/29/2013   Procedure: LEAD REVISION;  Surgeon: Duke Salvia, MD;  Location: [facility] CATH LAB;  Service: Cardiovascular;  Laterality: N/A;   TONSILLECTOMY     YAG LASER APPLICATION Left 12/27/2012   Procedure: YAG LASER APPLICATION;  Surgeon: Loraine Leriche T. Nile Riggs, MD;  Location: [facility] ORS;  Service: Ophthalmology;  Laterality: Left;   YAG LASER APPLICATION Right 01/10/2013   Procedure: YAG LASER APPLICATION;  Surgeon: Loraine Leriche T. Nile Riggs, MD;  Location: [facility];  Service: Ophthalmology;  Laterality: Right;    Social History   Socioeconomic History   Marital status: Widowed    Spouse name: Not on file   Number of children: 2   Years of education: Not on file  Highest education level: Not on file  Occupational History   Occupation: Retired    Comment: Copywriter, advertising: RETIRED  Tobacco Use   Smoking status: Never   Smokeless tobacco: Never  Vaping Use   Vaping Use: Never used  Substance and Sexual Activity   Alcohol use: No    Alcohol/week: 0.0 standard drinks of alcohol   Drug use: No   Sexual activity: Never  Other Topics Concern   Not on file  Social History Narrative   Not on file   Social  Determinants of Health   Financial Resource Strain: Not on file  Food Insecurity: No Food Insecurity (08/18/2022)   Hunger Vital Sign    Worried About Running Out of Food in the Last Year: Never true    Ran Out of Food in the Last Year: Never true  Transportation Needs: No Transportation Needs (08/18/2022)   PRAPARE - Administrator, Civil Service (Medical): No    Lack of Transportation (Non-Medical): No  Physical Activity: Not on file  Stress: Not on file  Social Connections: Not on file  Intimate Partner Violence: Not At Risk (08/18/2022)   Humiliation, Afraid, Rape, and Kick questionnaire    Fear of Current or Ex-Partner: No    Emotionally Abused: No    Physically Abused: No    Sexually Abused: No   Family History  Problem Relation Age of Onset   Heart attack Father    Heart attack Brother       VITAL SIGNS BP 121/79   Pulse 61   Temp (!) 97.5 F (36.4 C)   Resp 20   Ht 5' (1.524 m)   Wt 156 lb 3.2 oz (70.9 kg)   SpO2 97%   BMI 30.51 kg/m   Outpatient Encounter Medications as of 09/04/2022  Medication Sig   acetaminophen (TYLENOL) 325 MG tablet Take 2 tablets (650 mg total) by mouth every 6 (six) hours as needed for mild pain (or Fever >/= 101).   albuterol (VENTOLIN HFA) 108 (90 Base) MCG/ACT inhaler Inhale 2 puffs into the lungs every 6 (six) hours as needed for wheezing or shortness of breath.   apixaban (ELIQUIS) 2.5 MG TABS tablet Take 1 tablet (2.5 mg total) by mouth 2 (two) times daily.   atorvastatin (LIPITOR) 20 MG tablet Take 1 tablet (20 mg total) by mouth daily.   bisoprolol (ZEBETA) 5 MG tablet Take 1 tablet (5 mg total) by mouth daily.   Calcium Carbonate-Vitamin D (CALCIUM 600 + D PO) Take 1 tablet by mouth 2 (two) times daily.   dapagliflozin propanediol (FARXIGA) 10 MG TABS tablet Take 1 tablet (10 mg total) by mouth daily before breakfast.   dofetilide (TIKOSYN) 125 MCG capsule Take 1 capsule (125 mcg total) by mouth 2 (two) times daily.    Fluticasone-Umeclidin-Vilant (TRELEGY ELLIPTA) 100-62.5-25 MCG/ACT AEPB Inhale 1 puff into the lungs daily.   folic acid (FOLVITE) 1 MG tablet Take 1 tablet (1 mg total) by mouth daily.   losartan (COZAAR) 100 MG tablet Take 1 tablet (100 mg total) by mouth daily.   metolazone (ZAROXOLYN) 2.5 MG tablet Take 1 tablet (2.5 mg total) by mouth every Tuesday. Take  30 minutes prior to Torsemide   Polyethyl Glycol-Propyl Glycol (SYSTANE OP) Place 1 drop into both eyes daily as needed (Dry eyes).   polyethylene glycol (MIRALAX / GLYCOLAX) 17 g packet Take 17 g by mouth daily as needed for mild constipation.   potassium chloride  SA (KLOR-CON M20) 20 MEQ tablet Take 1 tablet (20 mEq total) by mouth daily.   torsemide (DEMADEX) 20 MG tablet Take 3 tablets (60 mg total) by mouth daily.   torsemide (DEMADEX) 20 MG tablet Take 20 mg by mouth daily as needed. Every evening prn   triamcinolone cream (KENALOG) 0.1 % Apply 1 application. topically daily as needed (itching).   Cholecalciferol (VITAMIN D) 2000 UNITS CAPS Take 2,000 Units by mouth at bedtime.   No facility-administered encounter medications on file as of 09/04/2022.     SIGNIFICANT DIAGNOSTIC EXAMS  TODAY  08-22-22: glucose 98; bun 40; creat 1.42; k+ 4.0; na++ 141; ca 8.9; gfr 36 mag 2.4 08-26-22: wbc 9.8; hgb 9.8; hct 30.9 mcv 98.4 plt 190; glucose 96; bun 63; creat 1.55; k+ 3.4; na++ 139; ca 8.9; gfr 32;   Review of Systems  Constitutional:  Negative for malaise/fatigue.  Respiratory:  Negative for cough and shortness of breath.   Cardiovascular:  Negative for chest pain, palpitations and leg swelling.  Gastrointestinal:  Negative for abdominal pain, constipation and heartburn.  Musculoskeletal:  Negative for back pain, joint pain and myalgias.  Skin: Negative.   Neurological:  Negative for dizziness.  Psychiatric/Behavioral:  The patient is not nervous/anxious.    Physical Exam Constitutional:      General: She is not in acute  distress.    Appearance: She is well-developed. She is not diaphoretic.  Neck:     Thyroid: No thyromegaly.  Cardiovascular:     Rate and Rhythm: Normal rate and regular rhythm.     Pulses: Normal pulses.     Heart sounds: Normal heart sounds.  Pulmonary:     Effort: Pulmonary effort is normal. No respiratory distress.     Breath sounds: Normal breath sounds.  Abdominal:     General: Bowel sounds are normal. There is no distension.     Palpations: Abdomen is soft.     Tenderness: There is no abdominal tenderness.  Musculoskeletal:        General: Normal range of motion.     Cervical back: Neck supple.     Right lower leg: No edema.     Left lower leg: No edema.  Lymphadenopathy:     Cervical: No cervical adenopathy.  Skin:    General: Skin is warm and dry.  Neurological:     Mental Status: She is alert and oriented to person, place, and time.  Psychiatric:        Mood and Affect: Mood normal.       ASSESSMENT/ PLAN:  TODAY  Chronic atrial fibrillation Chronic diastolic congestive heart failure Chronic respiratory failure with hypoxia  Will continue current medications Will continue current plan of care Her goal is for assisted living at this time   Time spent with patient: 40 minutes; therapy; medications; dietary    Synthia Innocent NP [facility]  call [phone]

## 2022-09-05 ENCOUNTER — Other Ambulatory Visit: Payer: Self-pay | Admitting: Internal Medicine

## 2022-09-05 ENCOUNTER — Encounter: Payer: Self-pay | Admitting: Internal Medicine

## 2022-09-05 DIAGNOSIS — N179 Acute kidney failure, unspecified: Secondary | ICD-10-CM | POA: Insufficient documentation

## 2022-09-08 ENCOUNTER — Telehealth: Payer: Self-pay | Admitting: Student

## 2022-09-08 ENCOUNTER — Other Ambulatory Visit (HOSPITAL_COMMUNITY)
Admission: RE | Admit: 2022-09-08 | Discharge: 2022-09-08 | Disposition: A | Discharge status: Home / Self Care | Payer: Medicare PPO | Source: Skilled Nursing Facility | Attending: Internal Medicine | Admitting: Internal Medicine

## 2022-09-08 DIAGNOSIS — I5033 Acute on chronic diastolic (congestive) heart failure: Secondary | ICD-10-CM | POA: Insufficient documentation

## 2022-09-08 DIAGNOSIS — Z79899 Other long term (current) drug therapy: Secondary | ICD-10-CM

## 2022-09-08 LAB — BASIC METABOLIC PANEL
Anion gap: 10 (ref 5–15)
BUN: 62 mg/dL — ABNORMAL HIGH (ref 8–23)
CO2: 29 mmol/L (ref 22–32)
Calcium: 8.9 mg/dL (ref 8.9–10.3)
Chloride: 99 mmol/L (ref 98–111)
Creatinine, Ser: 1.95 mg/dL — ABNORMAL HIGH (ref 0.44–1.00)
GFR, Estimated: 24 mL/min — ABNORMAL LOW (ref >60)
Glucose, Bld: 100 mg/dL — ABNORMAL HIGH (ref 70–99)
Potassium: 3.9 mmol/L (ref 3.5–5.1)
Sodium: 138 mmol/L (ref 135–145)

## 2022-09-08 LAB — BRAIN NATRIURETIC PEPTIDE: B Natriuretic Peptide: 693 pg/mL — ABNORMAL HIGH (ref 0.0–100.0)

## 2022-09-08 NOTE — Telephone Encounter (Signed)
   Please let the patient and her son know I reviewed labs from the [facility] and her kidney function is starting to improve as creatinine was previously at 2.26 and is down to 1.95. Potassium improved as we want this ~ 4.0 and at 3.9 now. Fluid level similar to prior values. Would continue current diuretic regimen at this time. Would recommend follow-up labs within 2 weeks (BMET).   Signed, Ellsworth Lennox, PA-C 09/08/2022, 7:56 PM

## 2022-09-09 NOTE — Telephone Encounter (Signed)
Spoke with Jonny Ruiz ( son) and informed of results and the need to have labs done in 2 weeks. Son voiced understanding and agrees with plan of care.

## 2022-09-09 NOTE — Progress Notes (Signed)
Pt has seen results via MyChart. NFN att.

## 2022-09-10 ENCOUNTER — Non-Acute Institutional Stay (SKILLED_NURSING_FACILITY): Payer: Medicare PPO | Admitting: Adult Health

## 2022-09-10 ENCOUNTER — Encounter: Payer: Self-pay | Admitting: Adult Health

## 2022-09-10 DIAGNOSIS — I5033 Acute on chronic diastolic (congestive) heart failure: Secondary | ICD-10-CM | POA: Diagnosis not present

## 2022-09-10 DIAGNOSIS — D631 Anemia in chronic kidney disease: Secondary | ICD-10-CM

## 2022-09-10 DIAGNOSIS — N1832 Chronic kidney disease, stage 3b: Secondary | ICD-10-CM | POA: Diagnosis not present

## 2022-09-10 DIAGNOSIS — N179 Acute kidney failure, unspecified: Secondary | ICD-10-CM | POA: Diagnosis not present

## 2022-09-10 DIAGNOSIS — I482 Chronic atrial fibrillation, unspecified: Secondary | ICD-10-CM | POA: Diagnosis not present

## 2022-09-10 NOTE — Progress Notes (Signed)
Location:  [facility] Room Number: 143 Place of Service:  SNF (31)   CODE STATUS: FULL CODE  Allergies  Allergen Reactions   Codeine Nausea And Vomiting   Fentanyl Itching    Itching, redness to scalp and neck   Keflex [Cephalexin] Itching and Rash    Chief Complaint  Patient presents with   Discharge Note    HPI:  She is being discharged to assisted living with home health for pt/ot. She will need a wheelchair and halo. The assisted living will be providing her medications and medical follow up. She had been hospitalized for acute on chronic diastolic congestive heart failure. She was admitted to this facility for short term rehab. She is independent with most adl functions. She is ready to transition to assisted living.   Past Medical History:  Diagnosis Date   Anxiety    Asthma    Chronic atrial fibrillation (HCC)    Chronic back pain    Chronic diastolic heart failure (HCC)    Chronic obstructive pulmonary disease, unspecified (HCC)    Complete heart block (HCC)    COPD (chronic obstructive pulmonary disease) (HCC)    Coronary atherosclerosis of native coronary artery    Multivessel status post CABG, DES PLA March 2006   DDD (degenerative disc disease), lumbar    Essential hypertension    Headache    ICD (implantable cardioverter-defibrillator) in place    Mixed hyperlipidemia    Osteoarthritis    Ventricular fibrillation (HCC) 2003   a. seen on PPM interrogation a/w syncope    Past Surgical History:  Procedure Laterality Date   CARDIAC DEFIBRILLATOR PLACEMENT     MDT dual chamber ICD   CARDIOVERSION N/A 08/09/2014   Procedure: CARDIOVERSION;  Surgeon: Thurmon Fair, MD;  Location: [facility];  Service: Cardiovascular;  Laterality: N/A;   CORONARY ARTERY BYPASS GRAFT     LIMA to LAD, SVG to diagonal, SVG to ramus and OM   ICD GENERATOR CHANGEOUT N/A 08/06/2021   Procedure: ICD GENERATOR CHANGEOUT;  Surgeon: Marinus Maw, MD;   Location: [facility] INVASIVE CV LAB;  Service: Cardiovascular;  Laterality: N/A;   IMPLANTABLE CARDIOVERTER DEFIBRILLATOR GENERATOR CHANGE N/A 09/25/2013   Procedure: IMPLANTABLE CARDIOVERTER DEFIBRILLATOR GENERATOR CHANGE;  Surgeon: Marinus Maw, MD;  Location: [facility] CATH LAB;  Service: Cardiovascular;  Laterality: N/A;   LEAD REVISION N/A 09/29/2013   Procedure: LEAD REVISION;  Surgeon: Duke Salvia, MD;  Location: [facility] CATH LAB;  Service: Cardiovascular;  Laterality: N/A;   TONSILLECTOMY     YAG LASER APPLICATION Left 12/27/2012   Procedure: YAG LASER APPLICATION;  Surgeon: Loraine Leriche T. Nile Riggs, MD;  Location: [facility]S;  Service: Ophthalmology;  Laterality: Left;   YAG LASER APPLICATION Right 01/10/2013   Procedure: YAG LASER APPLICATION;  Surgeon: Loraine Leriche T. Nile Riggs, MD;  Location: [facility];  Service: Ophthalmology;  Laterality: Right;    Social History   Socioeconomic History   Marital status: Widowed    Spouse name: Not on file   Number of children: 2   Years of education: Not on file   Highest education level: Not on file  Occupational History   Occupation: Retired    Comment: Copywriter, advertising: RETIRED  Tobacco Use   Smoking status: Never   Smokeless tobacco: Never  Vaping Use   Vaping Use: Never used  Substance and Sexual Activity   Alcohol use: No    Alcohol/week: 0.0 standard drinks of alcohol   Drug  use: No   Sexual activity: Never  Other Topics Concern   Not on file  Social History Narrative   Not on file   Social Determinants of Health   Financial Resource Strain: Not on file  Food Insecurity: No Food Insecurity (08/18/2022)   Hunger Vital Sign    Worried About Running Out of Food in the Last Year: Never true    Ran Out of Food in the Last Year: Never true  Transportation Needs: No Transportation Needs (08/18/2022)   PRAPARE - Administrator, Civil Service (Medical): No    Lack of Transportation (Non-Medical): No  Physical Activity: Not on file  Stress: Not  on file  Social Connections: Not on file  Intimate Partner Violence: Not At Risk (08/18/2022)   Humiliation, Afraid, Rape, and Kick questionnaire    Fear of Current or Ex-Partner: No    Emotionally Abused: No    Physically Abused: No    Sexually Abused: No   Family History  Problem Relation Age of Onset   Heart attack Father    Heart attack Brother       VITAL SIGNS BP 119/77   Pulse 81   Temp 98 F (36.7 C)   Resp 18   Ht 5' (1.524 m)   Wt 158 lb 12.8 oz (72 kg)   SpO2 95%   BMI 31.01 kg/m   Outpatient Encounter Medications as of 09/10/2022  Medication Sig   acetaminophen (TYLENOL) 325 MG tablet Take 2 tablets (650 mg total) by mouth every 6 (six) hours as needed for mild pain (or Fever >/= 101).   albuterol (VENTOLIN HFA) 108 (90 Base) MCG/ACT inhaler Inhale 2 puffs into the lungs every 6 (six) hours as needed for wheezing or shortness of breath.   apixaban (ELIQUIS) 2.5 MG TABS tablet Take 1 tablet (2.5 mg total) by mouth 2 (two) times daily.   atorvastatin (LIPITOR) 20 MG tablet Take 1 tablet (20 mg total) by mouth daily.   bisoprolol (ZEBETA) 5 MG tablet Take 1 tablet (5 mg total) by mouth daily.   Calcium Carbonate-Vitamin D (CALCIUM 600 + D PO) Take 1 tablet by mouth 2 (two) times daily.   dapagliflozin propanediol (FARXIGA) 10 MG TABS tablet Take 1 tablet (10 mg total) by mouth daily before breakfast.   dofetilide (TIKOSYN) 125 MCG capsule Take 1 capsule (125 mcg total) by mouth 2 (two) times daily.   Fluticasone-Umeclidin-Vilant (TRELEGY ELLIPTA) 100-62.5-25 MCG/ACT AEPB Inhale 1 puff into the lungs daily.   folic acid (FOLVITE) 1 MG tablet Take 1 tablet (1 mg total) by mouth daily.   losartan (COZAAR) 100 MG tablet Take 1 tablet (100 mg total) by mouth daily.   Polyethyl Glycol-Propyl Glycol (SYSTANE OP) Place 1 drop into both eyes daily as needed (Dry eyes).   polyethylene glycol (MIRALAX / GLYCOLAX) 17 g packet Take 17 g by mouth daily as needed for mild  constipation.   potassium chloride SA (KLOR-CON M20) 20 MEQ tablet Take 1 tablet (20 mEq total) by mouth daily.   torsemide (DEMADEX) 20 MG tablet Take 2 tablets (40 mg total) by mouth daily.   triamcinolone cream (KENALOG) 0.1 % Apply 1 application. topically daily as needed (itching).   [DISCONTINUED] Cholecalciferol (VITAMIN D) 2000 UNITS CAPS Take 2,000 Units by mouth at bedtime.   No facility-administered encounter medications on file as of 09/10/2022.     SIGNIFICANT DIAGNOSTIC EXAMS  TODAY  08-22-22: glucose 98; bun 40; creat 1.42; k+ 4.0; na++  141; ca 8.9; gfr 36 mag 2.4 08-26-22: wbc 9.8; hgb 9.8; hct 30.9 mcv 98.4 plt 190; glucose 96; bun 63; creat 1.55; k+ 3.4; na++ 139; ca 8.9; gfr 32;   Review of Systems  Constitutional:  Negative for malaise/fatigue.  Respiratory:  Negative for cough and shortness of breath.   Cardiovascular:  Negative for chest pain, palpitations and leg swelling.  Gastrointestinal:  Negative for abdominal pain, constipation and heartburn.  Musculoskeletal:  Negative for back pain, joint pain and myalgias.  Skin: Negative.   Neurological:  Negative for dizziness.  Psychiatric/Behavioral:  The patient is not nervous/anxious.     Physical Exam Constitutional:      General: She is not in acute distress.    Appearance: She is well-developed. She is not diaphoretic.  Neck:     Thyroid: No thyromegaly.  Cardiovascular:     Rate and Rhythm: Normal rate and regular rhythm.     Pulses: Normal pulses.     Heart sounds: Normal heart sounds.  Pulmonary:     Effort: Pulmonary effort is normal. No respiratory distress.     Breath sounds: Normal breath sounds.  Abdominal:     General: Bowel sounds are normal. There is no distension.     Palpations: Abdomen is soft.     Tenderness: There is no abdominal tenderness.  Musculoskeletal:        General: Normal range of motion.     Cervical back: Neck supple.     Right lower leg: No edema.     Left lower leg: No  edema.  Lymphadenopathy:     Cervical: No cervical adenopathy.  Skin:    General: Skin is warm and dry.  Neurological:     Mental Status: She is alert and oriented to person, place, and time.  Psychiatric:        Mood and Affect: Mood normal.       ASSESSMENT/ PLAN:   Patient is being discharged with the following home health services:  pt/ot: to evaluate and treat as indicated for gait balance strength adl training   Patient is being discharged with the following durable medical equipment:  will need a w/c and halo  Patient has been advised to f/u with their PCP in 1-2 weeks to for a transitions of care visit.  Social services at their facility was responsible for arranging this appointment.  Pt was provided with adequate prescriptions of noncontrolled medications to reach the scheduled appointment .  For controlled substances, a limited supply was provided as appropriate for the individual patient.  If the pt normally receives these medications from a pain clinic or has a contract with another physician, these medications should be received from that clinic or physician only).    Her medications will be provided by the assist living.   Time spent with patient: 35 minutes: medications; home health; dme   Synthia Innocent NP [facility]   call [phone]

## 2022-09-14 ENCOUNTER — Other Ambulatory Visit (HOSPITAL_COMMUNITY)
Admission: RE | Admit: 2022-09-14 | Discharge: 2022-09-14 | Disposition: A | Discharge status: Home / Self Care | Payer: Medicare PPO | Source: Skilled Nursing Facility | Attending: Adult Health | Admitting: Adult Health

## 2022-09-14 DIAGNOSIS — I5032 Chronic diastolic (congestive) heart failure: Secondary | ICD-10-CM | POA: Diagnosis not present

## 2022-09-14 LAB — BASIC METABOLIC PANEL
Anion gap: 15 (ref 5–15)
BUN: 49 mg/dL — ABNORMAL HIGH (ref 8–23)
CO2: 24 mmol/L (ref 22–32)
Calcium: 9.2 mg/dL (ref 8.9–10.3)
Chloride: 99 mmol/L (ref 98–111)
Creatinine, Ser: 1.93 mg/dL — ABNORMAL HIGH (ref 0.44–1.00)
GFR, Estimated: 25 mL/min — ABNORMAL LOW (ref >60)
Glucose, Bld: 109 mg/dL — ABNORMAL HIGH (ref 70–99)
Potassium: 4.3 mmol/L (ref 3.5–5.1)
Sodium: 138 mmol/L (ref 135–145)

## 2022-09-14 LAB — BRAIN NATRIURETIC PEPTIDE: B Natriuretic Peptide: 583 pg/mL — ABNORMAL HIGH (ref 0.0–100.0)

## 2022-09-15 ENCOUNTER — Telehealth: Payer: Self-pay | Admitting: Student

## 2022-09-15 DIAGNOSIS — J9610 Chronic respiratory failure, unspecified whether with hypoxia or hypercapnia: Secondary | ICD-10-CM | POA: Diagnosis not present

## 2022-09-15 DIAGNOSIS — J449 Chronic obstructive pulmonary disease, unspecified: Secondary | ICD-10-CM | POA: Diagnosis not present

## 2022-09-15 DIAGNOSIS — I5033 Acute on chronic diastolic (congestive) heart failure: Secondary | ICD-10-CM | POA: Diagnosis not present

## 2022-09-15 NOTE — Telephone Encounter (Signed)
Andrey Campanile made aware [place] will be in office tomorrow and will address PRN order.

## 2022-09-15 NOTE — Telephone Encounter (Signed)
[name] [name] stated she send over a fax on 09/11/2022 to [facility] in reference to new pt moving in today. She'd like a callback regarding this fax. Please advise

## 2022-09-16 DIAGNOSIS — I5033 Acute on chronic diastolic (congestive) heart failure: Secondary | ICD-10-CM | POA: Diagnosis not present

## 2022-09-16 DIAGNOSIS — J4489 Other specified chronic obstructive pulmonary disease: Secondary | ICD-10-CM | POA: Diagnosis not present

## 2022-09-16 DIAGNOSIS — I2510 Atherosclerotic heart disease of native coronary artery without angina pectoris: Secondary | ICD-10-CM | POA: Diagnosis not present

## 2022-09-16 DIAGNOSIS — I130 Hypertensive heart and chronic kidney disease with heart failure and stage 1 through stage 4 chronic kidney disease, or unspecified chronic kidney disease: Secondary | ICD-10-CM | POA: Diagnosis not present

## 2022-09-16 DIAGNOSIS — D631 Anemia in chronic kidney disease: Secondary | ICD-10-CM | POA: Diagnosis not present

## 2022-09-16 DIAGNOSIS — N1832 Chronic kidney disease, stage 3b: Secondary | ICD-10-CM | POA: Diagnosis not present

## 2022-09-16 DIAGNOSIS — I442 Atrioventricular block, complete: Secondary | ICD-10-CM | POA: Diagnosis not present

## 2022-09-16 DIAGNOSIS — I4901 Ventricular fibrillation: Secondary | ICD-10-CM | POA: Diagnosis not present

## 2022-09-16 DIAGNOSIS — F419 Anxiety disorder, unspecified: Secondary | ICD-10-CM | POA: Diagnosis not present

## 2022-09-17 DIAGNOSIS — I5033 Acute on chronic diastolic (congestive) heart failure: Secondary | ICD-10-CM | POA: Diagnosis not present

## 2022-09-17 DIAGNOSIS — I2510 Atherosclerotic heart disease of native coronary artery without angina pectoris: Secondary | ICD-10-CM | POA: Diagnosis not present

## 2022-09-17 DIAGNOSIS — Z79899 Other long term (current) drug therapy: Secondary | ICD-10-CM | POA: Diagnosis not present

## 2022-09-17 DIAGNOSIS — J4489 Other specified chronic obstructive pulmonary disease: Secondary | ICD-10-CM | POA: Diagnosis not present

## 2022-09-17 DIAGNOSIS — D631 Anemia in chronic kidney disease: Secondary | ICD-10-CM | POA: Diagnosis not present

## 2022-09-17 DIAGNOSIS — I130 Hypertensive heart and chronic kidney disease with heart failure and stage 1 through stage 4 chronic kidney disease, or unspecified chronic kidney disease: Secondary | ICD-10-CM | POA: Diagnosis not present

## 2022-09-17 DIAGNOSIS — I4901 Ventricular fibrillation: Secondary | ICD-10-CM | POA: Diagnosis not present

## 2022-09-17 DIAGNOSIS — J9611 Chronic respiratory failure with hypoxia: Secondary | ICD-10-CM | POA: Diagnosis not present

## 2022-09-17 DIAGNOSIS — N1832 Chronic kidney disease, stage 3b: Secondary | ICD-10-CM | POA: Diagnosis not present

## 2022-09-17 DIAGNOSIS — I442 Atrioventricular block, complete: Secondary | ICD-10-CM | POA: Diagnosis not present

## 2022-09-17 DIAGNOSIS — I5030 Unspecified diastolic (congestive) heart failure: Secondary | ICD-10-CM | POA: Diagnosis not present

## 2022-09-17 DIAGNOSIS — F419 Anxiety disorder, unspecified: Secondary | ICD-10-CM | POA: Diagnosis not present

## 2022-09-21 DIAGNOSIS — I5033 Acute on chronic diastolic (congestive) heart failure: Secondary | ICD-10-CM | POA: Diagnosis not present

## 2022-09-21 DIAGNOSIS — I2510 Atherosclerotic heart disease of native coronary artery without angina pectoris: Secondary | ICD-10-CM | POA: Diagnosis not present

## 2022-09-21 DIAGNOSIS — I4901 Ventricular fibrillation: Secondary | ICD-10-CM | POA: Diagnosis not present

## 2022-09-21 DIAGNOSIS — F419 Anxiety disorder, unspecified: Secondary | ICD-10-CM | POA: Diagnosis not present

## 2022-09-21 DIAGNOSIS — I442 Atrioventricular block, complete: Secondary | ICD-10-CM | POA: Diagnosis not present

## 2022-09-21 DIAGNOSIS — J4489 Other specified chronic obstructive pulmonary disease: Secondary | ICD-10-CM | POA: Diagnosis not present

## 2022-09-21 DIAGNOSIS — I130 Hypertensive heart and chronic kidney disease with heart failure and stage 1 through stage 4 chronic kidney disease, or unspecified chronic kidney disease: Secondary | ICD-10-CM | POA: Diagnosis not present

## 2022-09-21 DIAGNOSIS — N1832 Chronic kidney disease, stage 3b: Secondary | ICD-10-CM | POA: Diagnosis not present

## 2022-09-21 DIAGNOSIS — D631 Anemia in chronic kidney disease: Secondary | ICD-10-CM | POA: Diagnosis not present

## 2022-09-22 DIAGNOSIS — I2510 Atherosclerotic heart disease of native coronary artery without angina pectoris: Secondary | ICD-10-CM | POA: Diagnosis not present

## 2022-09-22 DIAGNOSIS — D631 Anemia in chronic kidney disease: Secondary | ICD-10-CM | POA: Diagnosis not present

## 2022-09-22 DIAGNOSIS — N1832 Chronic kidney disease, stage 3b: Secondary | ICD-10-CM | POA: Diagnosis not present

## 2022-09-22 DIAGNOSIS — J4489 Other specified chronic obstructive pulmonary disease: Secondary | ICD-10-CM | POA: Diagnosis not present

## 2022-09-22 DIAGNOSIS — I5033 Acute on chronic diastolic (congestive) heart failure: Secondary | ICD-10-CM | POA: Diagnosis not present

## 2022-09-22 DIAGNOSIS — I4901 Ventricular fibrillation: Secondary | ICD-10-CM | POA: Diagnosis not present

## 2022-09-22 DIAGNOSIS — I442 Atrioventricular block, complete: Secondary | ICD-10-CM | POA: Diagnosis not present

## 2022-09-22 DIAGNOSIS — I130 Hypertensive heart and chronic kidney disease with heart failure and stage 1 through stage 4 chronic kidney disease, or unspecified chronic kidney disease: Secondary | ICD-10-CM | POA: Diagnosis not present

## 2022-09-22 DIAGNOSIS — F419 Anxiety disorder, unspecified: Secondary | ICD-10-CM | POA: Diagnosis not present

## 2022-09-23 ENCOUNTER — Telehealth (HOSPITAL_COMMUNITY): Payer: Self-pay | Admitting: Vascular Surgery

## 2022-09-23 NOTE — Telephone Encounter (Signed)
Lvm to make ew chf appt w/ either provider

## 2022-09-24 ENCOUNTER — Telehealth: Payer: Self-pay | Admitting: Cardiology

## 2022-09-24 NOTE — Telephone Encounter (Signed)
Spoke to pt who stated that Dr. Ouida Sills increased her Torsemide to include an additional 20 mg tablet in the evenings for 5 days. Pt stated she had lab work with Dr. Ouida Sills that showed GFR >27. Will request lab work. Patient is asking that provider approve/deny taking extra tablet for 5 days.   Please advise.

## 2022-09-24 NOTE — Telephone Encounter (Signed)
Pt son returning call

## 2022-09-24 NOTE — Telephone Encounter (Signed)
Left a message for patient to call office back regarding medication 

## 2022-09-24 NOTE — Telephone Encounter (Signed)
Pt c/o medication issue:  1. Name of Medication:  torsemide (DEMADEX) 20 MG tablet  2. How are you currently taking this medication (dosage and times per day)?   3. Are you having a reaction (difficulty breathing--STAT)?   4. What is your medication issue?   Patient's son states Dr. Ouida Sills added an additional dose of 20 MG in the evening for 5 days. Patient would like to confirm whether Dr. Diona Browner agrees.

## 2022-09-25 NOTE — Telephone Encounter (Signed)
Spoke with pt son Jonny Ruiz and notified that is ok to proceed with Torsemide for 5 days.

## 2022-09-25 NOTE — Telephone Encounter (Signed)
Pt son calling back again. Informed him message has been sent back over to nurse yesterday they will call as soon as they can.    He states he goes into work at 9, if you can after that, he ask that you leave detailed vm with instructions.

## 2022-09-29 DIAGNOSIS — I130 Hypertensive heart and chronic kidney disease with heart failure and stage 1 through stage 4 chronic kidney disease, or unspecified chronic kidney disease: Secondary | ICD-10-CM | POA: Diagnosis not present

## 2022-09-29 DIAGNOSIS — J4489 Other specified chronic obstructive pulmonary disease: Secondary | ICD-10-CM | POA: Diagnosis not present

## 2022-09-29 DIAGNOSIS — I4901 Ventricular fibrillation: Secondary | ICD-10-CM | POA: Diagnosis not present

## 2022-09-29 DIAGNOSIS — I5033 Acute on chronic diastolic (congestive) heart failure: Secondary | ICD-10-CM | POA: Diagnosis not present

## 2022-09-29 DIAGNOSIS — F419 Anxiety disorder, unspecified: Secondary | ICD-10-CM | POA: Diagnosis not present

## 2022-09-29 DIAGNOSIS — N1832 Chronic kidney disease, stage 3b: Secondary | ICD-10-CM | POA: Diagnosis not present

## 2022-09-29 DIAGNOSIS — I442 Atrioventricular block, complete: Secondary | ICD-10-CM | POA: Diagnosis not present

## 2022-09-29 DIAGNOSIS — D631 Anemia in chronic kidney disease: Secondary | ICD-10-CM | POA: Diagnosis not present

## 2022-09-29 DIAGNOSIS — I2510 Atherosclerotic heart disease of native coronary artery without angina pectoris: Secondary | ICD-10-CM | POA: Diagnosis not present

## 2022-09-30 DIAGNOSIS — I130 Hypertensive heart and chronic kidney disease with heart failure and stage 1 through stage 4 chronic kidney disease, or unspecified chronic kidney disease: Secondary | ICD-10-CM | POA: Diagnosis not present

## 2022-09-30 DIAGNOSIS — N1832 Chronic kidney disease, stage 3b: Secondary | ICD-10-CM | POA: Diagnosis not present

## 2022-09-30 DIAGNOSIS — I442 Atrioventricular block, complete: Secondary | ICD-10-CM | POA: Diagnosis not present

## 2022-09-30 DIAGNOSIS — J4489 Other specified chronic obstructive pulmonary disease: Secondary | ICD-10-CM | POA: Diagnosis not present

## 2022-09-30 DIAGNOSIS — I5033 Acute on chronic diastolic (congestive) heart failure: Secondary | ICD-10-CM | POA: Diagnosis not present

## 2022-09-30 DIAGNOSIS — D631 Anemia in chronic kidney disease: Secondary | ICD-10-CM | POA: Diagnosis not present

## 2022-09-30 DIAGNOSIS — I2510 Atherosclerotic heart disease of native coronary artery without angina pectoris: Secondary | ICD-10-CM | POA: Diagnosis not present

## 2022-09-30 DIAGNOSIS — I4901 Ventricular fibrillation: Secondary | ICD-10-CM | POA: Diagnosis not present

## 2022-09-30 DIAGNOSIS — F419 Anxiety disorder, unspecified: Secondary | ICD-10-CM | POA: Diagnosis not present

## 2022-10-05 ENCOUNTER — Telehealth: Payer: Self-pay | Admitting: Student

## 2022-10-05 NOTE — Telephone Encounter (Addendum)
We received a fax from [facility] that said Resident [name] wt today is [phone]

## 2022-10-06 DIAGNOSIS — Z79899 Other long term (current) drug therapy: Secondary | ICD-10-CM | POA: Diagnosis not present

## 2022-10-06 NOTE — Telephone Encounter (Signed)
Called [facility] and spoke with Marcelino Duster in formed of Brittany's response.

## 2022-10-07 DIAGNOSIS — R0602 Shortness of breath: Secondary | ICD-10-CM | POA: Diagnosis not present

## 2022-10-07 DIAGNOSIS — I130 Hypertensive heart and chronic kidney disease with heart failure and stage 1 through stage 4 chronic kidney disease, or unspecified chronic kidney disease: Secondary | ICD-10-CM | POA: Diagnosis not present

## 2022-10-07 DIAGNOSIS — N1832 Chronic kidney disease, stage 3b: Secondary | ICD-10-CM | POA: Diagnosis not present

## 2022-10-07 DIAGNOSIS — J4489 Other specified chronic obstructive pulmonary disease: Secondary | ICD-10-CM | POA: Diagnosis not present

## 2022-10-07 DIAGNOSIS — I2510 Atherosclerotic heart disease of native coronary artery without angina pectoris: Secondary | ICD-10-CM | POA: Diagnosis not present

## 2022-10-07 DIAGNOSIS — I4901 Ventricular fibrillation: Secondary | ICD-10-CM | POA: Diagnosis not present

## 2022-10-07 DIAGNOSIS — F419 Anxiety disorder, unspecified: Secondary | ICD-10-CM | POA: Diagnosis not present

## 2022-10-07 DIAGNOSIS — I442 Atrioventricular block, complete: Secondary | ICD-10-CM | POA: Diagnosis not present

## 2022-10-07 DIAGNOSIS — I5033 Acute on chronic diastolic (congestive) heart failure: Secondary | ICD-10-CM | POA: Diagnosis not present

## 2022-10-07 DIAGNOSIS — D631 Anemia in chronic kidney disease: Secondary | ICD-10-CM | POA: Diagnosis not present

## 2022-10-07 DIAGNOSIS — R0609 Other forms of dyspnea: Secondary | ICD-10-CM | POA: Diagnosis not present

## 2022-10-07 LAB — BASIC METABOLIC PANEL
BUN/Creatinine Ratio: 23 (ref 12–28)
BUN: 40 mg/dL — ABNORMAL HIGH (ref 8–27)
CO2: 27 mmol/L (ref 20–29)
Calcium: 9.7 mg/dL (ref 8.7–10.3)
Chloride: 100 mmol/L (ref 96–106)
Creatinine, Ser: 1.75 mg/dL — ABNORMAL HIGH (ref 0.57–1.00)
Glucose: 93 mg/dL (ref 70–99)
Potassium: 4.3 mmol/L (ref 3.5–5.2)
Sodium: 145 mmol/L — ABNORMAL HIGH (ref 134–144)
eGFR: 28 mL/min/{1.73_m2} — ABNORMAL LOW (ref >59)

## 2022-10-08 ENCOUNTER — Encounter: Payer: Self-pay | Admitting: Student

## 2022-10-08 ENCOUNTER — Ambulatory Visit: Payer: Medicare PPO | Attending: Student | Admitting: Student

## 2022-10-08 VITALS — BP 110/70 | HR 55 | Ht 60.0 in | Wt 165.0 lb

## 2022-10-08 DIAGNOSIS — I5032 Chronic diastolic (congestive) heart failure: Secondary | ICD-10-CM | POA: Diagnosis not present

## 2022-10-08 DIAGNOSIS — I480 Paroxysmal atrial fibrillation: Secondary | ICD-10-CM | POA: Diagnosis not present

## 2022-10-08 DIAGNOSIS — N1832 Chronic kidney disease, stage 3b: Secondary | ICD-10-CM | POA: Diagnosis not present

## 2022-10-08 DIAGNOSIS — I10 Essential (primary) hypertension: Secondary | ICD-10-CM

## 2022-10-08 DIAGNOSIS — I272 Pulmonary hypertension, unspecified: Secondary | ICD-10-CM

## 2022-10-08 DIAGNOSIS — E785 Hyperlipidemia, unspecified: Secondary | ICD-10-CM

## 2022-10-08 DIAGNOSIS — I2510 Atherosclerotic heart disease of native coronary artery without angina pectoris: Secondary | ICD-10-CM | POA: Diagnosis not present

## 2022-10-08 DIAGNOSIS — Z95810 Presence of automatic (implantable) cardiac defibrillator: Secondary | ICD-10-CM | POA: Diagnosis not present

## 2022-10-08 MED ORDER — TORSEMIDE 20 MG PO TABS: ORAL_TABLET | ORAL | 3 refills | Status: DC

## 2022-10-08 NOTE — Patient Instructions (Signed)
Medication Instructions:  Your physician has recommended you make the following change in your medication:    Increase Torsemide to 60 mg alternating with 40 mg Daily   *If you need a refill on your cardiac medications before your next appointment, please call your pharmacy*   Lab Work: Your physician recommends that you return for lab work on July 5 at [facility].   If you have labs (blood work) drawn today and your tests are completely normal, you will receive your results only by: [employer] (if you have MyChart) OR A paper copy in the mail If you have any lab test that is abnormal or we need to change your treatment, we will call you to review the results.   Testing/Procedures: NONE    Follow-Up: At [facility], you and your health needs are our priority.  As part of our continuing mission to provide you with exceptional heart care, we have created designated Provider Care Teams.  These Care Teams include your primary Cardiologist (physician) and Advanced Practice Providers (APPs -  Physician Assistants and Nurse Practitioners) who all work together to provide you with the care you need, when you need it.  We recommend signing up for the patient portal called "MyChart".  Sign up information is provided on this After Visit Summary.  MyChart is used to connect with patients for Virtual Visits (Telemedicine).  Patients are able to view lab/test results, encounter notes, upcoming appointments, etc.  Non-urgent messages can be sent to your provider as well.   To learn more about what you can do with MyChart, go to [email or web address].    Your next appointment:    As Scheduled   Provider:   Nona Dell, MD    Other Instructions Thank you for choosing [facility]!

## 2022-10-08 NOTE — Progress Notes (Signed)
Cardiology Office Note    Date:  10/08/2022  ID:  [name], [name] 12-20-1931, MRN [id number] Cardiologist: Nona Dell, MD   EP: Dr. Ladona Ridgel  History of Present Illness:    [name] [name] is a 89 y.o. female with past medical history of CAD (s/p CABG in 1998 with LIMA-LAD, SVG-Diagonal, SVG-RI-OM, DES to PLA in 2006, low-risk NST in 09/2019), HFpEF, pulmonary HTN, chronic hypoxic respiratory failure/COPD, paroxysmal atrial fibrillation, history of VT (s/p ICD placement), HTN, HLD and Stage 3 CKD who presents to the office today for 69-month follow-up.   She was examined by myself in 08/2022 and was working with PT at [facility] and reported baseline dyspnea on exertion but denied any specific anginal symptoms. Recent labs had shown her creatinine had increased to 2.26, therefore once weekly Metolazone was discontinued and Torsemide was reduced to 40mg  daily. She had only been taking [place] 5mg  daily and this was titrated to 10mg  daily. In the interim, she has transitioned to [facility] and she has been taking an extra Torsemide 20mg  at times due to weight gain or worsening edema.   In talking with the patient and her son today, she reports her strength is starting to improve and she is using a walker at [facility] and working with PT regularly. Has baseline dyspnea on exertion and remains on supplemental oxygen but no orthopnea or PND. She has experienced worsening lower extremity edema over the past week and has noticed occasional water blisters at times. Weight fluctuates from 158 lbs to 162 lbs. Her son recently brought a recliner to her room and she is planning to elevate her lower extremities more frequently. She denies any chest pain or palpitations.   Studies Reviewed:   EKG: EKG is not ordered today.   NST: 09/2019 The study is normal. There are no perfusion defects This is a low risk study. The left ventricular ejection fraction is normal (55-65%). AV paced throughout the  study   PFT's: 05/2022   Echocardiogram: 06/2022 IMPRESSIONS     1. Left ventricular ejection fraction, by estimation, is 50 to 55%. Left  ventricular ejection fraction by 3D volume is 50 %. The left ventricle has  low normal function. The left ventricle has no regional wall motion  abnormalities. Left ventricular  diastolic function could not be evaluated.   2. Right ventricular systolic function is moderately reduced. The right  ventricular size is moderately enlarged. There is moderately elevated  pulmonary artery systolic pressure. The estimated right ventricular  systolic pressure is 59.1 mmHg.   3. Left atrial size was severely dilated.   4. Right atrial size was severely dilated.   5. The mitral valve is normal in structure. Mild to moderate mitral valve  regurgitation.   6. Tricuspid valve regurgitation is moderate to severe.   7. The aortic valve is tricuspid. There is mild calcification of the  aortic valve. There is mild thickening of the aortic valve. Aortic valve  regurgitation is mild. Aortic valve sclerosis is present, with no evidence  of aortic valve stenosis.   8. The inferior vena cava is dilated in size with <50% respiratory  variability, suggesting right atrial pressure of 15 mmHg.   Comparison(s): Prior images reviewed side by side. Tricuspid regurgitation  is much worse and right ventricular dilation and systolic dysfunction is  now present.   Risk Assessment/Calculations:    CHA2DS2-VASc Score = 6   This indicates a 9.7% annual risk of stroke. The patient's score is  based upon: CHF History: 1 HTN History: 1 Diabetes History: 0 Stroke History: 0 Vascular Disease History: 1 Age Score: 2 Gender Score: 1   Physical Exam:   VS:  BP 110/70   Pulse (!) 55   Ht 5' (1.524 m)   Wt 165 lb (74.8 kg)   SpO2 92% Comment: 3 L per [place]  BMI 32.22 kg/m    Wt Readings from Last 3 Encounters:  10/08/22 165 lb (74.8 kg)  09/10/22 158 lb 12.8 oz (72 kg)   09/04/22 155 lb (70.3 kg)     GEN: Pleasant, female appearing in no acute distress. Sitting in wheelchair.  NECK: No JVD; No carotid bruits CARDIAC: RRR, 2/6 systolic murmur along [place].  RESPIRATORY: No wheezing or rales. Scattered rhonchi. On supplemental oxygen.  ABDOMEN: Appears non-distended. No obvious abdominal masses. EXTREMITIES: No clubbing or cyanosis. 1+ pitting edema up to knees bilaterally.  Distal pedal pulses are 2+ bilaterally.   Assessment and Plan:   1. HFpEF/Pulmonary HTN - Most recent echo showed a low-normal EF of 50-55% with moderately reduced RV function and PASP at 59.1 mmHg. She was previously referred to Advanced Heart Failure by Dr. Diona Browner with anticipation of medical management and likely no invasive procedures given her age and comorbidities (appt scheduled in 10/2022). She likely has Group 2/3 Pulmonary HTN given PFT's in 05/2022 showed severe obstructive airway disease so options may be limited and the patient and her son are aware of this.  - Given her volume overload today, will cautiously titrate Torsemide from 40mg  daily to 40mg  daily alternating with 60mg  daily. Recheck BNP and BMET within 2 weeks. May ultimately require 60mg  daily. We reviewed the possibility of Unna boots as well if her edema does not improve. Continue Farxiga 10mg  daily, Bisoprolol 5mg  daily and Losartan 100mg  daily. May ultimately be able to switch from Losartan to [facility] but will follow-up on labs following titration of Torsemide as renal function previously prohibited this.   2. Paroxysmal Atrial Fibrillation - Device interrogation in 07/2022 showed an AF burden of 0.8% and consistent with flutter. She remains on Tikosyn per EP. Recent labs showed K+ was stable at 4.3 and will recheck following dose adjustment of Torsemide.  - No reports of active bleeding. Continue Eliquis 2.5mg  BID for anticoagulation which is the appropriate dose for now given her age, weight and renal function.    3. CAD - She is s/p CABG in 1998 with LIMA-LAD, SVG-Diagonal, SVG-RI-OM, DES to PLA in 2006 and most recent ischemic evaluation was a low-risk NST in 09/2019. - Continue Bisoprolol and Atorvastatin. She is not on ASA given the need for anticoagulation.   4. History of VT - She does have an ICD in place which is followed by Dr. Ladona Ridgel. Most recent interrogation in 07/2022 showed HF trends were abnormal from 05/2022 to 06/2022. Was also noted to have NSVT and VF of short duration but did not require interventions.   5. HTN - BP is well-controlled at 110/70 during today's visit. Continue Bisoprolol 5mg  daily and Losartan 100mg  daily.   6. HLD - LDL was at 58 on most recent check. Continue Atorvastatin 20mg  daily.   7. Stage 3 CKD - Creatinine has been variable from 1.3 to 2.2 this year, averaging around 1.8 - 1.9 more recently. Was overall stable at 1.75 when checked on 10/06/2022. Will recheck a follow-up BMET within 2 weeks following dose adjustment of Torsemide.   Signed, Ellsworth Lennox, PA-C

## 2022-10-13 DIAGNOSIS — F419 Anxiety disorder, unspecified: Secondary | ICD-10-CM | POA: Diagnosis not present

## 2022-10-13 DIAGNOSIS — I130 Hypertensive heart and chronic kidney disease with heart failure and stage 1 through stage 4 chronic kidney disease, or unspecified chronic kidney disease: Secondary | ICD-10-CM | POA: Diagnosis not present

## 2022-10-13 DIAGNOSIS — D631 Anemia in chronic kidney disease: Secondary | ICD-10-CM | POA: Diagnosis not present

## 2022-10-13 DIAGNOSIS — I4901 Ventricular fibrillation: Secondary | ICD-10-CM | POA: Diagnosis not present

## 2022-10-13 DIAGNOSIS — I2510 Atherosclerotic heart disease of native coronary artery without angina pectoris: Secondary | ICD-10-CM | POA: Diagnosis not present

## 2022-10-13 DIAGNOSIS — I5033 Acute on chronic diastolic (congestive) heart failure: Secondary | ICD-10-CM | POA: Diagnosis not present

## 2022-10-13 DIAGNOSIS — I442 Atrioventricular block, complete: Secondary | ICD-10-CM | POA: Diagnosis not present

## 2022-10-13 DIAGNOSIS — J4489 Other specified chronic obstructive pulmonary disease: Secondary | ICD-10-CM | POA: Diagnosis not present

## 2022-10-13 DIAGNOSIS — N1832 Chronic kidney disease, stage 3b: Secondary | ICD-10-CM | POA: Diagnosis not present

## 2022-10-14 DIAGNOSIS — I442 Atrioventricular block, complete: Secondary | ICD-10-CM | POA: Diagnosis not present

## 2022-10-14 DIAGNOSIS — I5033 Acute on chronic diastolic (congestive) heart failure: Secondary | ICD-10-CM | POA: Diagnosis not present

## 2022-10-14 DIAGNOSIS — J4489 Other specified chronic obstructive pulmonary disease: Secondary | ICD-10-CM | POA: Diagnosis not present

## 2022-10-14 DIAGNOSIS — I2510 Atherosclerotic heart disease of native coronary artery without angina pectoris: Secondary | ICD-10-CM | POA: Diagnosis not present

## 2022-10-14 DIAGNOSIS — F419 Anxiety disorder, unspecified: Secondary | ICD-10-CM | POA: Diagnosis not present

## 2022-10-14 DIAGNOSIS — I130 Hypertensive heart and chronic kidney disease with heart failure and stage 1 through stage 4 chronic kidney disease, or unspecified chronic kidney disease: Secondary | ICD-10-CM | POA: Diagnosis not present

## 2022-10-14 DIAGNOSIS — D631 Anemia in chronic kidney disease: Secondary | ICD-10-CM | POA: Diagnosis not present

## 2022-10-14 DIAGNOSIS — N1832 Chronic kidney disease, stage 3b: Secondary | ICD-10-CM | POA: Diagnosis not present

## 2022-10-14 DIAGNOSIS — I4901 Ventricular fibrillation: Secondary | ICD-10-CM | POA: Diagnosis not present

## 2022-10-16 DIAGNOSIS — I5033 Acute on chronic diastolic (congestive) heart failure: Secondary | ICD-10-CM | POA: Diagnosis not present

## 2022-10-16 DIAGNOSIS — J449 Chronic obstructive pulmonary disease, unspecified: Secondary | ICD-10-CM | POA: Diagnosis not present

## 2022-10-16 DIAGNOSIS — J9610 Chronic respiratory failure, unspecified whether with hypoxia or hypercapnia: Secondary | ICD-10-CM | POA: Diagnosis not present

## 2022-10-20 DIAGNOSIS — I2510 Atherosclerotic heart disease of native coronary artery without angina pectoris: Secondary | ICD-10-CM | POA: Diagnosis not present

## 2022-10-20 DIAGNOSIS — I5032 Chronic diastolic (congestive) heart failure: Secondary | ICD-10-CM | POA: Diagnosis not present

## 2022-10-21 LAB — BASIC METABOLIC PANEL
BUN/Creatinine Ratio: 19 (ref 12–28)
BUN: 26 mg/dL (ref 8–27)
CO2: 28 mmol/L (ref 20–29)
Calcium: 9.5 mg/dL (ref 8.7–10.3)
Chloride: 102 mmol/L (ref 96–106)
Creatinine, Ser: 1.4 mg/dL — ABNORMAL HIGH (ref 0.57–1.00)
Glucose: 90 mg/dL (ref 70–99)
Potassium: 4.6 mmol/L (ref 3.5–5.2)
Sodium: 145 mmol/L — ABNORMAL HIGH (ref 134–144)
eGFR: 36 mL/min/{1.73_m2} — ABNORMAL LOW (ref >59)

## 2022-10-21 LAB — MAGNESIUM: Magnesium: 2.5 mg/dL — ABNORMAL HIGH (ref 1.6–2.3)

## 2022-10-22 LAB — BRAIN NATRIURETIC PEPTIDE: BNP: 932.4 pg/mL — ABNORMAL HIGH (ref 0.0–100.0)

## 2022-10-23 NOTE — Progress Notes (Addendum)
ADVANCED HF CLINIC CONSULT NOTE  Referring Physician: [name], MD Primary Care: [name], MD Primary Cardiologist: Nona Dell, MD  HPI:  [name] [name] is a 89 y.o. female (never smoker) with CAD (s/p CABG in 1998 with LIMA-LAD, SVG-Diagonal, SVG-RI-OM, DES to PLA in 2006, low-risk NST in 09/2019), HFpEF, pulmonary HTN, chronic hypoxic respiratory failure/COPD, pPAF, history of VT (s/p ICD placement) and Stage 3 CKD who is referred by Dr. Diona Browner for further evaluation of her pulmonary HTN,    Here with family. Says she was doing pretty well until 6 months ago. Started getting weaker and had no energy. Currently now at [facility]. Working with PT regularly. Walks with walker. Has baseline dyspnea on exertion and remains on supplemental oxygen (3L). SOB with mild activity. + significant LE edema but some what improved recently. No orthopnea or PND. No CP  Echo 3/24: LVEF 50-55%. RV mod HK.RVSP 59. Severe biatrial enlargement. Mod MR. Mod-sev TR  PFTs 2/24 FEV1 0.79 (57%) FVC 1.30 (68%) DLCO 46%  In 5/24 labs had shown her creatinine had increased to 2.26, therefore once weekly Metolazone was discontinued and Torsemide was reduced to 40mg  daily. She had only been taking [place] 5mg  daily and this was titrated to 10mg  daily. Now taking an extra Torsemide 20mg  at times due to weight gain or worsening edema.    Review of Systems: [y] = yes, [ ]  = no   General: Weight gain [ ] ; Weight loss [ ] ; Anorexia [ ] ; Fatigue [ y]; Fever [ ] ; Chills [ ] ; Weakness [ ]   Cardiac: Chest pain/pressure [ ] ; Resting SOB [ ] ; Exertional SOB [ y]; Orthopnea [ ] ; Pedal Edema [name] ]; Palpitations [ ] ; Syncope [ ] ; Presyncope [ ] ; Paroxysmal nocturnal dyspnea[ ]   Pulmonary: Cough [ ] ; Wheezing[ ] ; Hemoptysis[ ] ; Sputum [ ] ; Snoring [ ]   GI: Vomiting[ ] ; Dysphagia[ ] ; Melena[ ] ; Hematochezia [ ] ; Heartburn[ ] ; Abdominal pain [ ] ; Constipation [ ] ; Diarrhea [ ] ; BRBPR [ ]   GU: Hematuria[ ] ; Dysuria [ ] ;  Nocturia[ ]   Vascular: Pain in legs with walking [ ] ; Pain in feet with lying flat [ ] ; Non-healing sores [ ] ; Stroke [ ] ; TIA [ ] ; Slurred speech [ ] ;  Neuro: Headaches[ ] ; Vertigo[ ] ; Seizures[ ] ; Paresthesias[ ] ;Blurred vision [ ] ; Diplopia [ ] ; Vision changes [ ]   Ortho/Skin: Arthritis [ y]; Joint pain [name] ]; Muscle pain [ ] ; Joint swelling [ ] ; Back Pain [ ] ; Rash [ ]   Psych: Depression[ ] ; Anxiety[y ]  Heme: Bleeding problems [ ] ; Clotting disorders [ ] ; Anemia [ ]   Endocrine: Diabetes [ ] ; Thyroid dysfunction[ ]    Past Medical History:  Diagnosis Date   Anxiety    Asthma    Chronic atrial fibrillation (HCC)    Chronic back pain    Chronic diastolic heart failure (HCC)    Chronic obstructive pulmonary disease, unspecified (HCC)    Complete heart block (HCC)    COPD (chronic obstructive pulmonary disease) (HCC)    Coronary atherosclerosis of native coronary artery    a. s/p CABG in 1998 with LIMA-LAD, SVG-Diagonal, SVG-RI-OM b. DES to PLA in 2006   DDD (degenerative disc disease), lumbar    Essential hypertension    Headache    ICD (implantable cardioverter-defibrillator) in place    Mixed hyperlipidemia    Osteoarthritis    Ventricular fibrillation (HCC) 2003   a. seen on PPM interrogation a/w syncope    Current Outpatient  Medications  Medication Sig Dispense Refill   acetaminophen (TYLENOL) 325 MG tablet Take 2 tablets (650 mg total) by mouth every 6 (six) hours as needed for mild pain (or Fever >/= 101). 100 tablet 2   albuterol (VENTOLIN HFA) 108 (90 Base) MCG/ACT inhaler Inhale 2 puffs into the lungs every 6 (six) hours as needed for wheezing or shortness of breath. 18 g 2   apixaban (ELIQUIS) 2.5 MG TABS tablet Take 1 tablet (2.5 mg total) by mouth 2 (two) times daily. 60 tablet 4   atorvastatin (LIPITOR) 20 MG tablet Take 1 tablet (20 mg total) by mouth daily. 90 tablet 3   bisoprolol (ZEBETA) 5 MG tablet Take 1 tablet (5 mg total) by mouth daily. 30 tablet 4    Calcium Carbonate-Vitamin D (CALCIUM 600 + D PO) Take 1 tablet by mouth 2 (two) times daily.     dapagliflozin propanediol (FARXIGA) 10 MG TABS tablet Take 1 tablet (10 mg total) by mouth daily before breakfast. 30 tablet 6   Fluticasone-Umeclidin-Vilant (TRELEGY ELLIPTA) 100-62.5-25 MCG/ACT AEPB Inhale 1 puff into the lungs daily. 60 each 6   folic acid (FOLVITE) 1 MG tablet Take 1 tablet (1 mg total) by mouth daily. 30 tablet 3   losartan (COZAAR) 100 MG tablet Take 1 tablet (100 mg total) by mouth daily. 30 tablet 4   Polyethyl Glycol-Propyl Glycol (SYSTANE OP) Place 1 drop into both eyes daily as needed (Dry eyes).     polyethylene glycol (MIRALAX / GLYCOLAX) 17 g packet Take 17 g by mouth daily as needed for mild constipation. 14 each 0   potassium chloride SA (KLOR-CON M20) 20 MEQ tablet Take 1 tablet (20 mEq total) by mouth daily. 90 tablet 3   torsemide (DEMADEX) 20 MG tablet Take 40 mg daily alternating with 60 mg Daily 180 tablet 3   triamcinolone cream (KENALOG) 0.1 % Apply 1 application. topically daily as needed (itching).     dofetilide (TIKOSYN) 125 MCG capsule Take 1 capsule (125 mcg total) by mouth 2 (two) times daily. 180 capsule 2   No current facility-administered medications for this encounter.    Allergies  Allergen Reactions   Codeine Nausea And Vomiting   Fentanyl Itching    Itching, redness to scalp and neck   Keflex [Cephalexin] Itching and Rash      Social History   Socioeconomic History   Marital status: Widowed    Spouse name: Not on file   Number of children: 2   Years of education: Not on file   Highest education level: Not on file  Occupational History   Occupation: Retired    Comment: Copywriter, advertising: RETIRED  Tobacco Use   Smoking status: Never   Smokeless tobacco: Never  Vaping Use   Vaping Use: Never used  Substance and Sexual Activity   Alcohol use: No    Alcohol/week: 0.0 standard drinks of alcohol   Drug use: No   Sexual  activity: Never  Other Topics Concern   Not on file  Social History Narrative   Not on file   Social Determinants of Health   Financial Resource Strain: Not on file  Food Insecurity: No Food Insecurity (08/18/2022)   Hunger Vital Sign    Worried About Running Out of Food in the Last Year: Never true    Ran Out of Food in the Last Year: Never true  Transportation Needs: No Transportation Needs (08/18/2022)   PRAPARE - Transportation  Lack of Transportation (Medical): No    Lack of Transportation (Non-Medical): No  Physical Activity: Not on file  Stress: Not on file  Social Connections: Not on file  Intimate Partner Violence: Not At Risk (08/18/2022)   Humiliation, Afraid, Rape, and Kick questionnaire    Fear of Current or Ex-Partner: No    Emotionally Abused: No    Physically Abused: No    Sexually Abused: No      Family History  Problem Relation Age of Onset   Heart attack Father    Heart attack Brother     Vitals:   10/26/22 1448  BP: 110/70  Pulse: 62  SpO2: 100%  Weight: 74.4 kg (164 lb)   Wt Readings from Last 3 Encounters:  10/26/22 74.4 kg (164 lb)  10/08/22 74.8 kg (165 lb)  09/10/22 72 kg (158 lb 12.8 oz)    PHYSICAL EXAM: General:  Elderly No respiratory difficulty HEENT: normal Neck: supple. JVP to ear Carotids 2+ bilat; no bruits. No lymphadenopathy or thryomegaly appreciated. Cor: PMI nondisplaced. Regular rate & rhythm. No rubs, gallops or murmurs. Lungs: clear Abdomen: soft, nontender, nondistended. No hepatosplenomegaly. No bruits or masses. Good bowel sounds. Extremities: no cyanosis, clubbing, rash, 2-3+ edema Neuro: alert & oriented x 3, cranial nerves grossly intact. moves all 4 extremities w/o difficulty. Affect pleasant.  ECG:AV paced 65 Personally reviewed   ASSESSMENT & PLAN:  1. HFpEF/Pulmonary HTN likely restrictive CM - Echo 3/24: LVEF 50-55%. RV mod HK.RVSP 59. Severe biatrial enlargement. Mod MR. Mod-sev TR (suggestive of  restrictive CM) - Likely WHO Group 2/3 PH so not candidate for selective pulmonary artery vasodilators - Goals of therapy are to maintain euvolemia and avoid hypoxemia - NYHA III (multifactorial) - Markedly volume overloaded but diuresis has been limited by CKD. Currently on torsemide 40 daily alternating with 60 daily (recently increased) - Continue Farxiga 10mg  daily  - Continue losartan 100 daily - Can consider decreasing dose of b-blocker to help with functional capacity as needed - Not on MRA due to CKD/AKI may benefit from cautious use down the road - Given use of tikosyn and previous difficulty with metolazone will avoid - We initially tried to use Furoscix but ALF will not allow - We will increase torsemide to 60 daily and have her use 60 bid for 2 days - Contact HHRN for UNNA boots - Check sleep study of OSA - Labs today and watch closely with diuresis - Will see back in clinic in several weeks   2. Paroxysmal Atrial Fibrillation - Device interrogation in 07/2022 showed an AF burden of 0.8% and consistent with flutter. She remains on Tikosyn per EP. - Tolerating Eliquis - Management per Dr. Diona Browner - Watch electrolytes very closely with diuresis. ICD in place   3. CAD - She is s/p CABG in 1998 with LIMA-LAD, SVG-Diagonal, SVG-RI-OM, DES to PLA in 2006 and most recent ischemic evaluation was a low-risk NST in 09/2019. - Continue Bisoprolol and Atorvastatin. She is not on ASA given the need for anticoagulation. - No current s/s angina    4. History of VT - She does have an ICD in place which is followed by Dr. Ladona Ridgel. Most recent interrogation in 07/2022 showed HF trends were abnormal from 05/2022 to 06/2022. Was also noted to have NSVT and VF of short duration but did not require interventions.    5. HTN - BP controlled   6. Stage IV CKD - Creatinine has been variable from 1.3 to 2.2 this  year, averaging around 1.8 - 1.9 more recently.  - Last check 10/20/22 Scr 1.4 -  Recheck today - Continue SGLT2i  Total time spent 50 minutes. Over half that time spent discussing above.     Arvilla Meres, MD  3:13 PM

## 2022-10-26 ENCOUNTER — Ambulatory Visit (HOSPITAL_COMMUNITY)
Admission: RE | Admit: 2022-10-26 | Discharge: 2022-10-26 | Disposition: A | Discharge status: Home / Self Care | Payer: Medicare PPO | Source: Ambulatory Visit | Attending: Internal Medicine | Admitting: Internal Medicine

## 2022-10-26 VITALS — BP 110/70 | HR 62 | Wt 164.0 lb

## 2022-10-26 DIAGNOSIS — Z95810 Presence of automatic (implantable) cardiac defibrillator: Secondary | ICD-10-CM | POA: Diagnosis not present

## 2022-10-26 DIAGNOSIS — Z79899 Other long term (current) drug therapy: Secondary | ICD-10-CM | POA: Insufficient documentation

## 2022-10-26 DIAGNOSIS — N184 Chronic kidney disease, stage 4 (severe): Secondary | ICD-10-CM | POA: Diagnosis not present

## 2022-10-26 DIAGNOSIS — I10 Essential (primary) hypertension: Secondary | ICD-10-CM

## 2022-10-26 DIAGNOSIS — I272 Pulmonary hypertension, unspecified: Secondary | ICD-10-CM | POA: Diagnosis not present

## 2022-10-26 DIAGNOSIS — Z951 Presence of aortocoronary bypass graft: Secondary | ICD-10-CM | POA: Diagnosis not present

## 2022-10-26 DIAGNOSIS — I5032 Chronic diastolic (congestive) heart failure: Secondary | ICD-10-CM | POA: Diagnosis not present

## 2022-10-26 DIAGNOSIS — I480 Paroxysmal atrial fibrillation: Secondary | ICD-10-CM | POA: Insufficient documentation

## 2022-10-26 DIAGNOSIS — I2510 Atherosclerotic heart disease of native coronary artery without angina pectoris: Secondary | ICD-10-CM | POA: Insufficient documentation

## 2022-10-26 DIAGNOSIS — Z7901 Long term (current) use of anticoagulants: Secondary | ICD-10-CM | POA: Diagnosis not present

## 2022-10-26 DIAGNOSIS — Z8249 Family history of ischemic heart disease and other diseases of the circulatory system: Secondary | ICD-10-CM | POA: Insufficient documentation

## 2022-10-26 DIAGNOSIS — I130 Hypertensive heart and chronic kidney disease with heart failure and stage 1 through stage 4 chronic kidney disease, or unspecified chronic kidney disease: Secondary | ICD-10-CM | POA: Insufficient documentation

## 2022-10-26 DIAGNOSIS — J449 Chronic obstructive pulmonary disease, unspecified: Secondary | ICD-10-CM | POA: Diagnosis not present

## 2022-10-26 DIAGNOSIS — Z9981 Dependence on supplemental oxygen: Secondary | ICD-10-CM | POA: Diagnosis not present

## 2022-10-26 LAB — BASIC METABOLIC PANEL
Anion gap: 15 (ref 5–15)
BUN: 32 mg/dL — ABNORMAL HIGH (ref 8–23)
CO2: 28 mmol/L (ref 22–32)
Calcium: 9.5 mg/dL (ref 8.9–10.3)
Chloride: 97 mmol/L — ABNORMAL LOW (ref 98–111)
Creatinine, Ser: 1.53 mg/dL — ABNORMAL HIGH (ref 0.44–1.00)
GFR, Estimated: 33 mL/min — ABNORMAL LOW (ref >60)
Glucose, Bld: 88 mg/dL (ref 70–99)
Potassium: 4.7 mmol/L (ref 3.5–5.1)
Sodium: 140 mmol/L (ref 135–145)

## 2022-10-26 LAB — BRAIN NATRIURETIC PEPTIDE: B Natriuretic Peptide: 520.6 pg/mL — ABNORMAL HIGH (ref 0.0–100.0)

## 2022-10-26 MED ORDER — TORSEMIDE 20 MG PO TABS: ORAL_TABLET | ORAL | 3 refills | Status: DC

## 2022-10-26 NOTE — Progress Notes (Signed)
Medication Samples have been provided to the patient.  Drug name: FUROSCIX       Strength: 80MG Janine Ores        Qty: 2  LOT: [id number]  Exp.Date: 12-19-23  Dosing instructions: TAKE AS DIRECTED  The patient has been instructed regarding the correct time, dose, and frequency of taking this medication, including desired effects and most common side effects.   Lauro Manlove M Kasiya Burck 4:04 PM 10/26/2022

## 2022-10-26 NOTE — Progress Notes (Signed)
Height:     Weight: BMI:  Today's Date:  STOP BANG RISK ASSESSMENT S (snore) Have you been told that you snore?   YES   T (tired) Are you often tired, fatigued, or sleepy during the day?   YES  O (obstruction) Do you stop breathing, choke, or gasp during sleep? YES   P (pressure) Do you have or are you being treated for high blood pressure? YES   B (BMI) Is your body index greater than 35 kg/m? NO   A (age) Are you 89 years old or older? YES   N (neck) Do you have a neck circumference greater than 16 inches?   NO   G (gender) Are you a female? NO   TOTAL STOP/BANG "YES" ANSWERS 5                                                                       For Office Use Only              Procedure Order Form    YES to 3+ Stop Bang questions OR two clinical symptoms - patient qualifies for WatchPAT (CPT 95800)      Clinical Notes: Will consult Sleep Specialist and refer for management of therapy due to patient increased risk of Sleep Apnea. Ordering a sleep study due to the following two clinical symptoms: Excessive daytime sleepiness G47.10 / Gastroesophageal reflux K21.9 / Nocturia R35.1 / Morning Headaches G44.221 / Difficulty concentrating R41.840 / Memory problems or poor judgment G31.84 / Personality changes or irritability R45.4 / Loud snoring R06.83 / Depression F32.9 / Unrefreshed by sleep G47.8 / Impotence N52.9 / History of high blood pressure R03.0 / Insomnia G47.00

## 2022-10-26 NOTE — Patient Instructions (Addendum)
Medication Changes:  INCREASE TORSEMIDE TO 60mg  ONCE DAILY- DO NOT TAKE THIS ON THE DAYS YOU ARE TAKING FUROSCIX  Dosing Directions:   Day 1= TOMORROW 10/27/22 USE 1 KIT OF FUROSCIX ALSO TAKE OF POTASSIUM   Day 2= 10/28/22 USE 1 KIT OF FUROSCIX ALSO TAKE OF POTASSIUM  Lab Work:  Labs done today, your results will be available in MyChart, we will contact you for abnormal readings.   Testing/Procedures:  Your provider has recommended that you have a home sleep study ([facility]amar Test).  We have provided you with the equipment in our office today. Please go ahead and download the app. DO NOT OPEN OR TAMPER WITH THE BOX UNTIL WE ADVISE YOU TO DO SO. Once insurance has approved the test our office will call you with PIN number and approval to proceed with testing. Once you have completed the test you just dispose of the equipment, the information is automatically uploaded to [place] via blue-tooth technology. If your test is positive for sleep apnea and you need a home CPAP machine you will be contacted by Dr Norris Cross office [facility]) to set this up.   Follow-Up in: 2-3 WEEKS WITH APP AS SCHEDULED   At the [facility], you and your health needs are our priority. We have a designated team specialized in the treatment of Heart Failure. This Care Team includes your primary Heart Failure Specialized Cardiologist (physician), Advanced Practice Providers (APPs- Physician Assistants and Nurse Practitioners), and Pharmacist who all work together to provide you with the care you need, when you need it.   You may see any of the following providers on your designated Care Team at your next follow up:  Dr. Arvilla Meres Dr. Marca Ancona Dr. Marcos Eke, NP Robbie Lis, [place] [facility] [place], [place] Brynda Peon, NP Karle Plumber, PharmD   Please be sure to bring in all your medications bottles to every appointment.   Need to Contact  us:  If you have any questions or concerns before your next appointment please send [place] a message through [place] or call our office at [phone].    TO LEAVE A MESSAGE FOR THE NURSE SELECT OPTION 2, PLEASE LEAVE A MESSAGE INCLUDING: YOUR NAME DATE OF BIRTH CALL BACK NUMBER REASON FOR CALL**this is important as we prioritize the call backs  YOU WILL RECEIVE A CALL BACK THE SAME DAY AS LONG AS YOU CALL BEFORE 4:00 PM

## 2022-10-27 ENCOUNTER — Telehealth (HOSPITAL_COMMUNITY): Payer: Self-pay | Admitting: Cardiology

## 2022-10-27 DIAGNOSIS — I4901 Ventricular fibrillation: Secondary | ICD-10-CM | POA: Diagnosis not present

## 2022-10-27 DIAGNOSIS — F419 Anxiety disorder, unspecified: Secondary | ICD-10-CM | POA: Diagnosis not present

## 2022-10-27 DIAGNOSIS — N1832 Chronic kidney disease, stage 3b: Secondary | ICD-10-CM | POA: Diagnosis not present

## 2022-10-27 DIAGNOSIS — I130 Hypertensive heart and chronic kidney disease with heart failure and stage 1 through stage 4 chronic kidney disease, or unspecified chronic kidney disease: Secondary | ICD-10-CM | POA: Diagnosis not present

## 2022-10-27 DIAGNOSIS — I2510 Atherosclerotic heart disease of native coronary artery without angina pectoris: Secondary | ICD-10-CM | POA: Diagnosis not present

## 2022-10-27 DIAGNOSIS — I442 Atrioventricular block, complete: Secondary | ICD-10-CM | POA: Diagnosis not present

## 2022-10-27 DIAGNOSIS — I5033 Acute on chronic diastolic (congestive) heart failure: Secondary | ICD-10-CM | POA: Diagnosis not present

## 2022-10-27 DIAGNOSIS — J4489 Other specified chronic obstructive pulmonary disease: Secondary | ICD-10-CM | POA: Diagnosis not present

## 2022-10-27 DIAGNOSIS — D631 Anemia in chronic kidney disease: Secondary | ICD-10-CM | POA: Diagnosis not present

## 2022-10-27 NOTE — Telephone Encounter (Signed)
Marcelino Duster with [facility] [phone]  Called to report while pt is at facility, unable to use furoscix per nurse director. -will pt need changes in diuretics -facility is unable to give IV lasix    unable to apply unna boots as supplies are not available. -if wraps are still needed will need HH order  Clarification was needed for potassium -advised will provide clarification as diuretics are likely to change with new plan   Fax # [phone]

## 2022-10-28 NOTE — Telephone Encounter (Signed)
Per Marcelino Duster with [facility] Patient is established [facility] through PT, unna boots order mentioned at PT visit 7/10, PT can initiate RN services. Unna boots are in process    Per Dr Gala Romney Increase torsemide to 60 mg twice a day for 2 days since facility unable to use furoscix.

## 2022-10-29 ENCOUNTER — Ambulatory Visit: Payer: Medicare PPO | Admitting: Internal Medicine

## 2022-10-29 ENCOUNTER — Encounter: Payer: Self-pay | Admitting: Internal Medicine

## 2022-10-29 VITALS — BP 94/60 | HR 63 | Ht 60.0 in | Wt 166.4 lb

## 2022-10-29 DIAGNOSIS — J9611 Chronic respiratory failure with hypoxia: Secondary | ICD-10-CM | POA: Diagnosis not present

## 2022-10-29 DIAGNOSIS — R0609 Other forms of dyspnea: Secondary | ICD-10-CM

## 2022-10-29 NOTE — Assessment & Plan Note (Signed)
Advised:  Make sure you check your oxygen saturation  AT  your highest level of activity (not after you stop)   to be sure it stays over 90% and adjust  02 flow upward to maintain this level if needed but remember to turn it back to previous settings when you stop (to conserve your supply).   F/u can be yearly          Each maintenance medication was reviewed in detail including emphasizing most importantly the difference between maintenance and prns and under what circumstances the prns are to be triggered using an action plan format where appropriate.  Total time for H and P, chart review, counseling, reviewing pdi./02  device(s) and generating customized AVS unique to this office visit / same day charting = 25 min

## 2022-10-29 NOTE — Progress Notes (Signed)
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 Etiology  with prior pfts 2016 showing mild aiflow obst not better on saba.  Pt unsure of baseline wt   Reported sob only with severe exertion at pfts 2016 @ wt 170    History of Present Illness  04/23/2022  Pulmonary/ 1st [name] eval/ [name] [name] / [name]  Chief Complaint  Patient presents with   Consult    SOB- O2 desaturations.  Patient was 87% room air after walking from lobby to room but stated she did not want to be on O2   Dyspnea:  100 ft / can still push cart at [employer]/ mb is 75 ft slt down to it.  Cough: started one day prior to OV  / minimal mucoid not noct  Sleep: flat in bed one pillow ok  SABA use: once a day but very poor technique  02: does not use it  Rec Stop lopressor and start biosoprolol 10 mg daily to see what difference this makes Work on inhaler technique:  Only use your albuterol as a rescue medication  Also  Ok to try albuterol 15 min before an activity (on alternating days)  that you know would usually make you short of breath     - PFT's  05/22/22  FEV1 0.79 (57 % ) ratio 0.61  p 11 % improvement from saba p ? prior to study with DLCO  8.36 (50%)   and FV curve min concave     06/17/2022  f/u ov/[name] [name]/[name] [name] re: chronic asthma/ ex desats  maint on Breo 100 daily   Chief Complaint  Patient presents with   Follow-up    06/14/2022 For breathing and leg swelling  Have been titrating O2 to keep sats above 90%  Breathing is worse   Dyspnea:  mostly housebound / sob to bathroom/ not checking sats with activity  Cough: congestion back of throat / no significant production  Sleeping: slept in recliner one night prior @ 30 degrees  SABA use: not really using  hfa  02: 24/7 at 3lpm  Covid status: vax max/ neve infected   Rec Make sure you check your oxygen saturation  AT  your highest level of activity (not after you stop)   to be sure it stays over 90%   Elevate legs when sleeping and and as much as you can during the day  Trelegy 100 one click each am x 2 weeks then return to clinic   If worse breathing or can't keep sats over 90% at rest on max 02 > go to [name] [name]         07/15/2022  f/u ov/[place] [name]/[name] [name] re: bilateral effusion mod chronic asthma  maint on Trelegy 100   Chief Complaint  Patient presents with   Follow-up    Echo results- would like to discuss   Dyspnea:  3lpm getting around the house better since prior ov  Cough: none  Sleeping: lift chair 30 degrees SABA use: none 02: 3lpm 24/7  Rec No change in recommendations  Be sure to adjust 02 if needed to keep sats > 90%  Will need to get cxr before next visit with cardiology  Discuss with [name] [name] possibility of right heart Cath to determine pressures  on R vs Left side of heart that are likely the cause of your pleural fluid.   Admission date:  08/18/2022    Discharge Date:  08/23/2022    Acute on chronic diastolic (congestive) heart failure (HCC)   Essential hypertension, benign   Automatic implantable cardioverter-defibrillator in situ   Chronic kidney disease, stage III (moderate) (HCC)   Chronic atrial fibrillation (HCC)   Chronic obstructive pulmonary disease, unspecified (HCC)   Chronic respiratory failure with hypoxia (HCC)        Chief Complaint: Difficulty Breathing, Leg swelling    HPI: [name] is a 89 y.o. female with medical history significant for  Diastolic CHF, CKD 3b-4, CABG- 2956, Atria fibrillation on anti-coag, chronic respiratory failure on 3 L. Patient was sent to the ED from outpatient cardiology [name] for diuresis.  Patient has had ongoing bilateral lower extremity swelling, and difficulty breathing with exertion.  Since January this year, patient has put on about 16 pounds-baseline weight  of about 154-179.  With ambulation just 10 to 15 feet at home, patient's O2 sats on 3 L dropped to 80s.  No chest pain. She has been compliant with torsemide 80 mg every other day alternating with 60 mg, and metolazone 2.5 was recently added.  Still with no improvement in edema.  Brief Narrative:  89 y.o. female with medical history significant for  Diastolic CHF, CKD 3b-4, CABG- 2130, Atria fibrillation on anti-coag, chronic respiratory failure on 3 L admitted with acute diastolic CHF exacerbation on 08/18/2022   -Assessment and Plan: 1)Acute on chronic diastolic (congestive) heart failure (HCC) -Admit to with weight gain, no extremity edema, elevated BNP, chest x-ray findings of pleural effusions and worsening shortness of breath after failing oral diuretics at home  -Recent echo-EF of 50 to 55%, RV pressure 59.1.   -PTA patient was on torsemide daily (alternating between 60 and 80 mg every daily), and metolazone 2.5 mg   -Cardiology consult appreciated -Diuresed really well with IV Lasix -Weight is down to 154 pounds from 165 pounds -REDs Vest is down to 36 from 38 yesterday -Fluid balance remains negative -Dyspnea has improved significantly -Cardiologist recommends discharge on Demadex 80 mg daily, patient may take additional 40 mg if gains more than 3 pounds, take metolazone 2.5 mg and potassium 20 meq qTuesday -Repeat chest x-ray on 08/23/2022 noted small bilateral pleural effusions left greater than right and findings of possible bronchitis (Demadex and doxycycline as prescribed) -Outpatient follow-up with cardiologist [name]. Diona [name] in about 3 weeks advised -     2)Chronic obstructive pulmonary disease, unspecified (HCC) -Currently requiring 3 L per nasal cannula  -at baseline uses 3 L per nasal cannula -Doxycycline as above for presumed bronchitis -Please see #1 above   3)Chronic atrial fibrillation/history of V. tach -status post AICD placement Stable.  -Continue Tikosyn,  bisoprolol, Eliquis -Follow-up with [name]. Ladona [name] as outpatient   4)CKD stage -3B -Creatinine improved with diuresis -Avoid NSAIDs -Repeat BMP on Wednesday, 08/26/2022   5)HTN- Stable. C/n  bisoprolol 10 mg,  losartan 100 mg,, amlodipine 2.5 mg daily -Transitioned from IV Lasix to p.o. Demadex as above #1   6)CAD--- chest pain-free -s/p CABG in 1998 with LIMA-LAD, SVG-Diagonal, SVG-RI-OM, DES to PLA in 2006 and she did have a low risk NST in 09/2019.  Continue atorvastatin 20 mg daily and  Bisoprolol -No aspirin as patient is already on Eliquis   7)Generalized weakness and Deconditioning----PT eval appreciated -Discharge to SNF in relatively stable condition   08/27/2022  f/u ov/[place] [name]/Jamyia Fortune re: 02 dep/ chf with ? Chronic asthma maint on trelegy   Chief Complaint  Patient presents with   Follow-up   Dyspnea:  not walking hallways yet  Cough: none  Sleeping: slt elevated at [facility]  SABA use: none 02: 3lpm 24 Rec [employer] for 02 saturations is 90% or greater    10/29/2022  f/u ov/[place] [name]/Onie Hayashi re: asthma/ 02 dep maint on trelegy 100   Chief Complaint  Patient presents with   Follow-up    Feet have been swollen- plans to increase demadex 10/30/22.  Breathing is unchanged since last visit here. She rarely uses her rescue inhaler.   Dyspnea:  PT walks in hallway with rollator  Cough: none  Sleeping: bed is level/ one pillow not aware  SABA use: none  02: 3lpm HS / pulsed at 3lpm for activity  and overnight study pending  Says PT checking it when walking and not aware it's being titrated      No obvious day to day or daytime variability or assoc excess/ purulent sputum or mucus plugs or hemoptysis or cp or chest tightness, subjective wheeze or overt sinus or hb symptoms.   Sleeping  without nocturnal  or early am exacerbation  of respiratory  c/o's or need for noct saba. Also denies any obvious fluctuation of symptoms with weather or environmental changes or  other aggravating or alleviating factors except as outlined above   No unusual exposure hx or h/o childhood pna/ asthma or knowledge of premature birth.  Current Allergies, Complete Past Medical History, Past Surgical History, Family History, and Social History were reviewed in [employer] record.  ROS  The following are not active complaints unless bolded Hoarseness, sore throat, dysphagia, dental problems, itching, sneezing,  nasal congestion or discharge of excess mucus or purulent secretions, ear ache,   fever, chills, sweats, unintended wt loss or wt gain, classically pleuritic or exertional cp,  orthopnea pnd or arm/hand swelling  or leg swelling(improved since last cards ov) , presyncope, palpitations, abdominal pain, anorexia, nausea, vomiting, diarrhea  or change in bowel habits or change in bladder habits, change in stools or change in urine, dysuria, hematuria,  rash, arthralgias, visual complaints, headache, numbness, weakness or ataxia or problems with walking or coordination,  change in mood or  memory.        Current Meds  Medication Sig   acetaminophen (TYLENOL) 325 MG tablet Take 2 tablets (650 mg total) by mouth every 6 (six) hours as needed for mild pain (or Fever >/= 101).   albuterol (VENTOLIN HFA) 108 (90 Base) MCG/ACT inhaler Inhale 2 puffs into the lungs every 6 (six) hours as needed for wheezing or shortness of breath.   apixaban (ELIQUIS) 2.5 MG TABS tablet Take 1 tablet (2.5 mg total) by mouth 2 (two) times daily.   atorvastatin (LIPITOR) 20 MG tablet Take 1 tablet (20 mg total) by mouth daily.   bisoprolol (ZEBETA) 5 MG tablet Take 1 tablet (5 mg total) by mouth daily.   Calcium Carbonate-Vitamin D (CALCIUM 600 + D PO) Take 1 tablet by mouth 2 (two) times daily.   dapagliflozin propanediol (FARXIGA) 10 MG TABS tablet Take 1 tablet (10 mg total) by mouth daily before breakfast.   dofetilide (TIKOSYN) 125 MCG capsule Take 1 capsule (125 mcg total)  by mouth 2 (two) times daily.   Fluticasone-Umeclidin-Vilant (TRELEGY ELLIPTA) 100-62.5-25 MCG/ACT AEPB Inhale 1 puff into the lungs daily.   folic acid (FOLVITE) 1 MG  tablet Take 1 tablet (1 mg total) by mouth daily.   losartan (COZAAR) 100 MG tablet Take 1 tablet (100 mg total) by mouth daily.   Polyethyl Glycol-Propyl Glycol (SYSTANE OP) Place 1 drop into both eyes daily as needed (Dry eyes).   polyethylene glycol (MIRALAX / GLYCOLAX) 17 g packet Take 17 g by mouth daily as needed for mild constipation.   potassium chloride SA (KLOR-CON M20) 20 MEQ tablet Take 1 tablet (20 mEq total) by mouth daily.   torsemide (DEMADEX) 20 MG tablet TAKE 60mg  ONCE DAILY   triamcinolone cream (KENALOG) 0.1 % Apply 1 application. topically daily as needed (itching).                 Past Medical History:  Diagnosis Date   Anxiety    Chronic atrial fibrillation (HCC)    Chronic back pain    Chronic diastolic heart failure (HCC)    Chronic obstructive pulmonary disease, unspecified (HCC)    Complete heart block (HCC)    COPD (chronic obstructive pulmonary disease) (HCC)    Coronary atherosclerosis of native coronary artery    Multivessel status post CABG, DES PLA March 2006   DDD (degenerative disc disease), lumbar    Essential hypertension    Headache    ICD (implantable cardioverter-defibrillator) in place    Mixed hyperlipidemia    Osteoarthritis    Ventricular fibrillation (HCC) 2003   a. seen on PPM interrogation a/w syncope       Objective:    Wts  10/29/2022       166  08/27/2022         155   07/15/2022       164 07/02/2022       163   06/17/22 163 lb 6.4 oz (74.1 kg)  06/14/22 160 lb (72.6 kg)  05/22/22 160 lb (72.6 kg)    Vital signs reviewed  10/29/2022  - Note at rest 02 sats  90% on 3lpm pulsed in w/c    General appearance:    w/c bound pleasant alert wf nad    HEENT : Oropharynx  clear    NECK :  without  apparent JVD/ palpable Nodes/TM    LUNGS: no acc muscle use,   Min barrel /kyphotic  contour chest wall with bilateral  slightly decreased bs s audible wheeze and  without cough on insp or exp maneuvers and min  Hyperresonant  to  percussion bilaterally    CV:  RRR  no s3 or murmur or increase in P2, and no pitting edema   ABD:  soft and nontender with pos end  insp Hoover's  in the supine position.  No bruits or organomegaly appreciated   MS:  Nl gait/ ext warm without deformities Or obvious joint restrictions  calf tenderness, cyanosis or clubbing     SKIN: warm and dry without lesions    NEURO:  alert, approp, nl sensorium with  no motor or cerebellar deficits apparent.              I personally reviewed images and agree with radiology impression as follows:  CXR:   Pa and lateral 08/23/22  1. Persistent bibasilar areas of atelectasis and/or consolidation with superimposed small bilateral pleural effusions (left-greater-than-right). 2. Diffuse interstitial prominence and peribronchial cuffing, concerning for an acute bronchitis. 3. Mild cardiomegaly. 4. Aortic atherosclerosis.    Assessment

## 2022-10-29 NOTE — Assessment & Plan Note (Signed)
Onset was "with heart problems"  but improved p cabg / rehab  - never smoker - Echo 09/20/19 mod diastolic dysfunction  - PFT's  @ wt 170  11/07/14  FEV1 1.14 (69 % ) ratio 0.68  p 8 % improvement from saba p nothing prior to study with DLCO  10.72 (53%)   and FV curve mildly concave    - desat with walking 04/23/2022  see ex hypoxemia - PFT's @ wt 160  05/22/22  FEV1 0.79 (57 % ) ratio 0.61  p 11 % improvement from saba p ? prior to study with DLCO  8.36 (50%)   and FV curve min concave   - CTa2/25/24 c/w R>L effusion / atx  - Echo 07/09/22  1. Left ventricular ejection fraction, by estimation, is 50 to 55%. Left ventricular ejection fraction by 3D volume is 50 %. The left ventricle has low normal function.   2. Right ventricular systolic function is moderately reduced. The right ventricular size is moderately enlarged. There is moderately elevated pulmonary artery systolic pressure. The estimated right ventricular systolic pressure is 59.1 mmHg.  3. Left atrial size was severely dilated.  4. Right atrial size was severely dilated.  5. The mitral valve is normal in structure. Mild to moderate mitral valve regurgitation.  6. Tricuspid valve regurgitation is moderate to severe.  7.  Aortic valve regurgitation is mild.    8. The inferior vena cava is dilated in size with <50% respiratory variability, suggesting right atrial pressure of 15 mmHg.  Mostly restrictive (kyposis) related lung dz with tendency to CHF  L and R sided on empirical trelegy 100 with little evidence it's helping but so frail reasonable to continue if for nothing but cardiac asthma prevention

## 2022-10-29 NOTE — Patient Instructions (Signed)
Make sure you check your oxygen saturation  AT  your highest level of activity (not after you stop)   to be sure it stays over 90% and adjust  02 flow upward to maintain this level if needed but remember to turn it back to previous settings when you stop (to conserve your supply).  ° ° °Please schedule a follow up visit in 12 months but call sooner if needed  °

## 2022-10-30 ENCOUNTER — Encounter (INDEPENDENT_AMBULATORY_CARE_PROVIDER_SITE_OTHER): Payer: Medicare PPO | Admitting: Cardiology

## 2022-10-30 ENCOUNTER — Telehealth: Payer: Self-pay

## 2022-10-30 DIAGNOSIS — J4489 Other specified chronic obstructive pulmonary disease: Secondary | ICD-10-CM | POA: Diagnosis not present

## 2022-10-30 DIAGNOSIS — I4901 Ventricular fibrillation: Secondary | ICD-10-CM | POA: Diagnosis not present

## 2022-10-30 DIAGNOSIS — D631 Anemia in chronic kidney disease: Secondary | ICD-10-CM | POA: Diagnosis not present

## 2022-10-30 DIAGNOSIS — I2510 Atherosclerotic heart disease of native coronary artery without angina pectoris: Secondary | ICD-10-CM | POA: Diagnosis not present

## 2022-10-30 DIAGNOSIS — I272 Pulmonary hypertension, unspecified: Secondary | ICD-10-CM

## 2022-10-30 DIAGNOSIS — I130 Hypertensive heart and chronic kidney disease with heart failure and stage 1 through stage 4 chronic kidney disease, or unspecified chronic kidney disease: Secondary | ICD-10-CM | POA: Diagnosis not present

## 2022-10-30 DIAGNOSIS — I442 Atrioventricular block, complete: Secondary | ICD-10-CM | POA: Diagnosis not present

## 2022-10-30 DIAGNOSIS — F419 Anxiety disorder, unspecified: Secondary | ICD-10-CM | POA: Diagnosis not present

## 2022-10-30 DIAGNOSIS — R0683 Snoring: Secondary | ICD-10-CM

## 2022-10-30 DIAGNOSIS — I5033 Acute on chronic diastolic (congestive) heart failure: Secondary | ICD-10-CM | POA: Diagnosis not present

## 2022-10-30 DIAGNOSIS — G4733 Obstructive sleep apnea (adult) (pediatric): Secondary | ICD-10-CM | POA: Diagnosis not present

## 2022-10-30 DIAGNOSIS — N1832 Chronic kidney disease, stage 3b: Secondary | ICD-10-CM | POA: Diagnosis not present

## 2022-10-30 NOTE — Telephone Encounter (Signed)
Pt son called letting us know that he is taking her monitor to her. She is now staying at [facility] in [place].

## 2022-11-02 DIAGNOSIS — I4901 Ventricular fibrillation: Secondary | ICD-10-CM | POA: Diagnosis not present

## 2022-11-02 DIAGNOSIS — J4489 Other specified chronic obstructive pulmonary disease: Secondary | ICD-10-CM | POA: Diagnosis not present

## 2022-11-02 DIAGNOSIS — D631 Anemia in chronic kidney disease: Secondary | ICD-10-CM | POA: Diagnosis not present

## 2022-11-02 DIAGNOSIS — F419 Anxiety disorder, unspecified: Secondary | ICD-10-CM | POA: Diagnosis not present

## 2022-11-02 DIAGNOSIS — I2510 Atherosclerotic heart disease of native coronary artery without angina pectoris: Secondary | ICD-10-CM | POA: Diagnosis not present

## 2022-11-02 DIAGNOSIS — I5033 Acute on chronic diastolic (congestive) heart failure: Secondary | ICD-10-CM | POA: Diagnosis not present

## 2022-11-02 DIAGNOSIS — I442 Atrioventricular block, complete: Secondary | ICD-10-CM | POA: Diagnosis not present

## 2022-11-02 DIAGNOSIS — N1832 Chronic kidney disease, stage 3b: Secondary | ICD-10-CM | POA: Diagnosis not present

## 2022-11-02 DIAGNOSIS — I130 Hypertensive heart and chronic kidney disease with heart failure and stage 1 through stage 4 chronic kidney disease, or unspecified chronic kidney disease: Secondary | ICD-10-CM | POA: Diagnosis not present

## 2022-11-04 ENCOUNTER — Ambulatory Visit: Payer: Medicare PPO | Attending: Cardiology | Admitting: Cardiology

## 2022-11-04 ENCOUNTER — Encounter: Payer: Self-pay | Admitting: Cardiology

## 2022-11-04 VITALS — BP 128/70 | HR 62 | Ht 60.0 in | Wt 165.0 lb

## 2022-11-04 DIAGNOSIS — I5032 Chronic diastolic (congestive) heart failure: Secondary | ICD-10-CM | POA: Diagnosis not present

## 2022-11-04 DIAGNOSIS — I272 Pulmonary hypertension, unspecified: Secondary | ICD-10-CM

## 2022-11-04 DIAGNOSIS — I480 Paroxysmal atrial fibrillation: Secondary | ICD-10-CM | POA: Diagnosis not present

## 2022-11-04 NOTE — Patient Instructions (Signed)
Medication Instructions:  Your physician recommends that you continue on your current medications as directed. Please refer to the Current Medication list given to you today.  Labwork: None today  Testing/Procedures: None today  Follow-Up: 4-6 weeks  Any Other Special Instructions Will Be Listed Below (If Applicable).  If you need a refill on your cardiac medications before your next appointment, please call your pharmacy.

## 2022-11-04 NOTE — Progress Notes (Signed)
Cardiology Office Note  Date: 11/04/2022   ID: [name] [name], DOB 14-Dec-1931, MRN [id number]  History of Present Illness: [name] [name] is an 89 y.o. female recently seen by Dr. Gala Romney in the heart failure clinic on July 8, I reviewed the note. She resides at [facility] and is here with an Geophysicist/field seismologist.  Torsemide was increased after her last visit, temporarily 60 mg twice daily for 2 days and now 60 mg daily.  Her weight is relatively stable, currently wearing Unna boots.  She is in a wheelchair today with chronic NYHA class III dyspnea, wearing supplemental oxygen.  States that her appetite is good, she may actually be eating more than she was at home since her meals are being prepared.  Try to avoid salt as best she can.  Medtronic ICD in place with followed by Dr. Ladona Ridgel.  Device interrogation April indicated less than 1% AF/AT burden.  Also NSVT/VF of short duration not requiring therapies.  I reviewed her medications which we will not modify today.  She has a follow-up visit in the heart failure clinic next week.  Of note, Furoscix was apparently not allowed to be used at her facility, although I wonder whether this was a misconception about the type of device.  Physical Exam: VS:  BP 128/70 (BP Location: Right Arm, Patient Position: Sitting, Cuff Size: Large)   Pulse 62   Ht 5' (1.524 m)   Wt 165 lb (74.8 kg)   SpO2 94%   BMI 32.22 kg/m , BMI Body mass index is 32.22 kg/m.  Wt Readings from Last 3 Encounters:  11/04/22 165 lb (74.8 kg)  10/29/22 166 lb 6.4 oz (75.5 kg)  10/26/22 164 lb (74.4 kg)    General: Patient appears comfortable at rest.  Wearing oxygen via nasal cannula. HEENT: Conjunctiva and lids normal. Neck: Supple, no elevated JVP or carotid bruits. Lungs: Decreased breath sounds without wheezing. Cardiac: Regular rate and rhythm, no S3, 2/6 systolic murmur. Extremities: Unna boots in place bilaterally.  ECG:  An ECG dated 10/26/2022 was personally reviewed  today and demonstrated:  Dual-chamber pacing.  Labwork: November 2023: Cholesterol 128, triglycerides 163, HDL 42, LDL 58 04/23/2022: TSH 2.810 08/26/2022: Platelets 190 08/31/2022: ALT 17; AST 23; Hemoglobin 9.8 10/20/2022: Magnesium 2.5 10/26/2022: B Natriuretic Peptide 520.6; BUN 32; Creatinine, Ser 1.53; Potassium 4.7; Sodium 140   Other Studies Reviewed Today:  Echocardiogram 07/08/2022:  1. Left ventricular ejection fraction, by estimation, is 50 to 55%. Left  ventricular ejection fraction by 3D volume is 50 %. The left ventricle has  low normal function. The left ventricle has no regional wall motion  abnormalities. Left ventricular  diastolic function could not be evaluated.   2. Right ventricular systolic function is moderately reduced. The right  ventricular size is moderately enlarged. There is moderately elevated  pulmonary artery systolic pressure. The estimated right ventricular  systolic pressure is 59.1 mmHg.   3. Left atrial size was severely dilated.   4. Right atrial size was severely dilated.   5. The mitral valve is normal in structure. Mild to moderate mitral valve  regurgitation.   6. Tricuspid valve regurgitation is moderate to severe.   7. The aortic valve is tricuspid. There is mild calcification of the  aortic valve. There is mild thickening of the aortic valve. Aortic valve  regurgitation is mild. Aortic valve sclerosis is present, with no evidence  of aortic valve stenosis.   8. The inferior vena cava is dilated  in size with <50% respiratory  variability, suggesting right atrial pressure of 15 mmHg.   Assessment and Plan:  1.  HFpEF with restrictive cardiomyopathy and WHO group 2/3 pulmonary hypertension.  She has NYHA class III dyspnea which is most likely multifactorial.  LVEF 50 to 55% by echocardiogram in March, RVSP estimated 59 mmHg.  Plan to continue Demadex at 60 mg daily with potassium supplement along with losartan and Farxiga.  Recent creatinine 1.53.   She is wearing Unna boots and also has follow-up in the heart failure clinic next week.  No changes were made today.  2.  Multivessel CAD status post CABG in 1998 with LIMA to LAD, SVG to diagonal, and SVG to ramus intermedius/OM.  Subsequently underwent DES to the PLA in 2006.  No angina at low-level activity.  Not on aspirin given use of Eliquis.  Continue Lipitor.  3.  Paroxysmal atrial fibrillation.  CHA2DS2-VASc score is 5.  She remains on Tikosyn, bisoprolol, and Eliquis.  Last device interrogation indicated less than 1% arrhythmia burden in April.  4.  COPD with chronic hypoxic respiratory failure.  She continues to follow with Dr. Sherene Sires.  5.  CKD stage IV.  Recent follow-up creatinine 1.53 with normal potassium.  6.  Mixed hyperlipidemia.  LDL 58 in November 2023 on Lipitor.  7.  Medtronic ICD in place with follow-up by Dr. Ladona Ridgel.  No recent device shocks or syncope.  Disposition:  Follow up  4 to 6 weeks.  Signed, Jonelle Sidle, M.D., F.A.C.C. [facility]

## 2022-11-05 DIAGNOSIS — F419 Anxiety disorder, unspecified: Secondary | ICD-10-CM | POA: Diagnosis not present

## 2022-11-05 DIAGNOSIS — N1832 Chronic kidney disease, stage 3b: Secondary | ICD-10-CM | POA: Diagnosis not present

## 2022-11-05 DIAGNOSIS — J4489 Other specified chronic obstructive pulmonary disease: Secondary | ICD-10-CM | POA: Diagnosis not present

## 2022-11-05 DIAGNOSIS — D631 Anemia in chronic kidney disease: Secondary | ICD-10-CM | POA: Diagnosis not present

## 2022-11-05 DIAGNOSIS — I130 Hypertensive heart and chronic kidney disease with heart failure and stage 1 through stage 4 chronic kidney disease, or unspecified chronic kidney disease: Secondary | ICD-10-CM | POA: Diagnosis not present

## 2022-11-05 DIAGNOSIS — I2510 Atherosclerotic heart disease of native coronary artery without angina pectoris: Secondary | ICD-10-CM | POA: Diagnosis not present

## 2022-11-05 DIAGNOSIS — I5033 Acute on chronic diastolic (congestive) heart failure: Secondary | ICD-10-CM | POA: Diagnosis not present

## 2022-11-05 DIAGNOSIS — I4901 Ventricular fibrillation: Secondary | ICD-10-CM | POA: Diagnosis not present

## 2022-11-05 DIAGNOSIS — I442 Atrioventricular block, complete: Secondary | ICD-10-CM | POA: Diagnosis not present

## 2022-11-06 ENCOUNTER — Ambulatory Visit (INDEPENDENT_AMBULATORY_CARE_PROVIDER_SITE_OTHER): Payer: Medicare PPO

## 2022-11-06 DIAGNOSIS — I442 Atrioventricular block, complete: Secondary | ICD-10-CM | POA: Diagnosis not present

## 2022-11-06 DIAGNOSIS — I5032 Chronic diastolic (congestive) heart failure: Secondary | ICD-10-CM | POA: Diagnosis not present

## 2022-11-06 DIAGNOSIS — I4901 Ventricular fibrillation: Secondary | ICD-10-CM | POA: Diagnosis not present

## 2022-11-06 DIAGNOSIS — I5033 Acute on chronic diastolic (congestive) heart failure: Secondary | ICD-10-CM | POA: Diagnosis not present

## 2022-11-06 DIAGNOSIS — F419 Anxiety disorder, unspecified: Secondary | ICD-10-CM | POA: Diagnosis not present

## 2022-11-06 DIAGNOSIS — D631 Anemia in chronic kidney disease: Secondary | ICD-10-CM | POA: Diagnosis not present

## 2022-11-06 DIAGNOSIS — N1832 Chronic kidney disease, stage 3b: Secondary | ICD-10-CM | POA: Diagnosis not present

## 2022-11-06 DIAGNOSIS — I2510 Atherosclerotic heart disease of native coronary artery without angina pectoris: Secondary | ICD-10-CM | POA: Diagnosis not present

## 2022-11-06 DIAGNOSIS — R0609 Other forms of dyspnea: Secondary | ICD-10-CM | POA: Diagnosis not present

## 2022-11-06 DIAGNOSIS — R0602 Shortness of breath: Secondary | ICD-10-CM | POA: Diagnosis not present

## 2022-11-06 DIAGNOSIS — J4489 Other specified chronic obstructive pulmonary disease: Secondary | ICD-10-CM | POA: Diagnosis not present

## 2022-11-06 DIAGNOSIS — I130 Hypertensive heart and chronic kidney disease with heart failure and stage 1 through stage 4 chronic kidney disease, or unspecified chronic kidney disease: Secondary | ICD-10-CM | POA: Diagnosis not present

## 2022-11-06 NOTE — Procedures (Signed)
    SLEEP STUDY REPORT Patient Information Study Date: 10/30/2022 Patient Name: [name] [name] Patient ID: [id number] Birth Date: [birth year] Age: 89 Gender: Female BMI: 32.0 (W=163 lb, H=5' 0'') Referring Physician: Nicholes Mango, MD  TEST DESCRIPTION: Home sleep apnea testing was completed using the WatchPat, a Type 1 device, utilizing  peripheral arterial tonometry (PAT), chest movement, actigraphy, pulse oximetry, pulse rate, body position and snore.  AHI was calculated with apnea and hypopnea using valid sleep time as the denominator. RDI includes apneas,  hypopneas, and RERAs. The data acquired and the scoring of sleep and all associated events were performed in  accordance with the recommended standards and specifications as outlined in the AASM Manual for the Scoring of  Sleep and Associated Events 2.2.0 (2015).   FINDINGS: 1. No evidence of Obstructive Sleep Apnea with AHI 3.2/hr.  2. No Central Sleep Apnea. 3. Oxygen desaturations as low as 90%. 4. Severe snoring was present. O2 sats were < 88% for 0 minutes. 5. Total sleep time was 5 hrs and 16 min. 6. 0% of total sleep time was spent in REM sleep.  7. sleep onset latency at 22 min.  8. Total awakenings were 18.  9. Arrhythmia Detection: Possible atrial fibrillation lasting 1 minute and 34 seconds. This is not diagnostic and  recommend further testing if clinically indicated.   DIAGNOSIS:  Normal study with no significant sleep disordered breathing. Possible Atrial Fibrillation  RECOMMENDATIONS: 1. Normal study with no significant sleep disordered breathing. 2. Healthy sleep recommendations include: adequate nightly sleep (normal 7-9 hrs/night), avoidance of caffeine after  noon and alcohol near bedtime, and maintaining a sleep environment that is cool, dark and quiet. 3. Weight loss for overweight patients is recommended.  4. Snoring recommendations include: weight loss where appropriate, side sleeping, and avoidance of  alcohol before  bed. 5. Operation of motor vehicle or dangerous equipment must be avoided when feeling drowsy, excessively sleepy, or  mentally fatigued.  6. An ENT consultation which may be useful for specific causes of and possible treatment of bothersome snoring .  7. Weight loss may be of benefit in reducing the severity of snoring.   Signature: Armanda Magic, MD; [facility]; Diplomat, American Board of Sleep  Medicine Electronically Signed: 11/06/2022 10:07:27 AM

## 2022-11-09 ENCOUNTER — Encounter (HOSPITAL_COMMUNITY): Payer: Self-pay

## 2022-11-09 ENCOUNTER — Ambulatory Visit (HOSPITAL_COMMUNITY)
Admission: RE | Admit: 2022-11-09 | Discharge: 2022-11-09 | Disposition: A | Discharge status: Home / Self Care | Payer: Medicare PPO | Source: Ambulatory Visit | Attending: Family Medicine | Admitting: Family Medicine

## 2022-11-09 VITALS — BP 128/80 | HR 60 | Wt 163.6 lb

## 2022-11-09 DIAGNOSIS — Z95810 Presence of automatic (implantable) cardiac defibrillator: Secondary | ICD-10-CM | POA: Diagnosis not present

## 2022-11-09 DIAGNOSIS — I272 Pulmonary hypertension, unspecified: Secondary | ICD-10-CM | POA: Insufficient documentation

## 2022-11-09 DIAGNOSIS — I4729 Other ventricular tachycardia: Secondary | ICD-10-CM | POA: Diagnosis not present

## 2022-11-09 DIAGNOSIS — J449 Chronic obstructive pulmonary disease, unspecified: Secondary | ICD-10-CM | POA: Insufficient documentation

## 2022-11-09 DIAGNOSIS — I10 Essential (primary) hypertension: Secondary | ICD-10-CM

## 2022-11-09 DIAGNOSIS — I5032 Chronic diastolic (congestive) heart failure: Secondary | ICD-10-CM | POA: Diagnosis not present

## 2022-11-09 DIAGNOSIS — I2510 Atherosclerotic heart disease of native coronary artery without angina pectoris: Secondary | ICD-10-CM | POA: Insufficient documentation

## 2022-11-09 DIAGNOSIS — I130 Hypertensive heart and chronic kidney disease with heart failure and stage 1 through stage 4 chronic kidney disease, or unspecified chronic kidney disease: Secondary | ICD-10-CM | POA: Insufficient documentation

## 2022-11-09 DIAGNOSIS — N184 Chronic kidney disease, stage 4 (severe): Secondary | ICD-10-CM | POA: Diagnosis not present

## 2022-11-09 DIAGNOSIS — Z951 Presence of aortocoronary bypass graft: Secondary | ICD-10-CM | POA: Diagnosis present

## 2022-11-09 DIAGNOSIS — I480 Paroxysmal atrial fibrillation: Secondary | ICD-10-CM | POA: Insufficient documentation

## 2022-11-09 MED ORDER — TORSEMIDE 20 MG PO TABS: ORAL_TABLET | ORAL | 6 refills | Status: DC

## 2022-11-09 MED ORDER — POTASSIUM CHLORIDE CRYS ER 20 MEQ PO TBCR: 20.0000 meq | EXTENDED_RELEASE_TABLET | Freq: Two times a day (BID) | ORAL | 6 refills | Status: DC

## 2022-11-09 NOTE — Progress Notes (Signed)
ADVANCED HF CLINIC NOTE  PCP: [name], MD Primary Cardiologist: Nona Dell, MD HF Cardiologist: Dr. Gala Romney  HPI: [name] [name] is a. 89 y.o.[name] female (never smoker) with CAD (s/p CABG in 1998 with LIMA-LAD, SVG-Diagonal, SVG-RI-OM, DES to PLA in 2006, low-risk NST in 09/2019), HFpEF, pulmonary HTN, chronic hypoxic respiratory failure/COPD, pPAF, history of VT (s/p ICD placement) and Stage 3 CKD who is referred by Dr. Diona Browner for further evaluation of her pulmonary HTN,   PFTs 2/24 FEV1 0.79 (57%) FVC 1.30 (68%) DLCO 46%   Echo 3/24: LVEF 50-55%, RV mod HK, RVSP 59, severe biatrial enlargement, mod MR, mod-sev TR  In 5/24, labs had shown her creatinine had increased to 2.26, therefore once weekly metolazone was discontinued and torsemide was reduced to 40 mg daily. She had only been taking Farxiga 5 mg daily and this was titrated to 10 mg daily.   Initial visit with Dr. Gala Romney 10/26/22, she had increased weakness over past 6 months. Markedly volume overloaded, and diuresis complicated by CKD. Residing at [facility], unable to use Furoscix. Torsemide increased to 60 bid x 2 days, then 60 daily thereafter and Unna boots arranged.  Today she returns for close HF follow up with her son and daughter. Overall feeling fair. She remains SOB with any activity. She can walk 50 feet with her rolling walker without significant dyspnea. Has unna boots on, feels she still has fluid on board. She is on oxygen continuously. Denies palpitations, CP, dizziness,or PND/Orthopnea. Appetite poor. No fever or chills. Taking all medications provided by facility.  Cardiac Studies - Echo (3/24): EF 50-55%, RV mod HK, RVSP 59, severe biatrial enlargement, mod MR, mod-sev TR  Past Medical History:  Diagnosis Date   Anxiety    Asthma    Chronic atrial fibrillation (HCC)    Chronic back pain    Chronic diastolic heart failure (HCC)    Chronic obstructive pulmonary disease, unspecified (HCC)     Complete heart block (HCC)    COPD (chronic obstructive pulmonary disease) (HCC)    Coronary atherosclerosis of native coronary artery    a. s/p CABG in 1998 with LIMA-LAD, SVG-Diagonal, SVG-RI-OM b. DES to PLA in 2006   DDD (degenerative disc disease), lumbar    Essential hypertension    Headache    ICD (implantable cardioverter-defibrillator) in place    Mixed hyperlipidemia    Osteoarthritis    Ventricular fibrillation (HCC) 2003   a. seen on PPM interrogation a/w syncope   Current Outpatient Medications  Medication Sig Dispense Refill   acetaminophen (TYLENOL) 325 MG tablet Take 2 tablets (650 mg total) by mouth every 6 (six) hours as needed for mild pain (or Fever >/= 101). 100 tablet 2   albuterol (VENTOLIN HFA) 108 (90 Base) MCG/ACT inhaler Inhale 2 puffs into the lungs every 6 (six) hours as needed for wheezing or shortness of breath. 18 g 2   apixaban (ELIQUIS) 2.5 MG TABS tablet Take 1 tablet (2.5 mg total) by mouth 2 (two) times daily. 60 tablet 4   atorvastatin (LIPITOR) 20 MG tablet Take 1 tablet (20 mg total) by mouth daily. 90 tablet 3   bisoprolol (ZEBETA) 5 MG tablet Take 1 tablet (5 mg total) by mouth daily. 30 tablet 4   Calcium Carbonate-Vitamin D (CALCIUM 600 + D PO) Take 1 tablet by mouth 2 (two) times daily.     dapagliflozin propanediol (FARXIGA) 10 MG TABS tablet Take 1 tablet (10 mg total) by mouth daily before  breakfast. 30 tablet 6   dofetilide (TIKOSYN) 125 MCG capsule Take 1 capsule (125 mcg total) by mouth 2 (two) times daily. 180 capsule 2   Fluticasone-Umeclidin-Vilant (TRELEGY ELLIPTA) 100-62.5-25 MCG/ACT AEPB Inhale 1 puff into the lungs daily. 60 each 6   folic acid (FOLVITE) 1 MG tablet Take 1 tablet (1 mg total) by mouth daily. 30 tablet 3   losartan (COZAAR) 100 MG tablet Take 1 tablet (100 mg total) by mouth daily. 30 tablet 4   Polyethyl Glycol-Propyl Glycol (SYSTANE OP) Place 1 drop into both eyes daily as needed (Dry eyes).     polyethylene glycol  (MIRALAX / GLYCOLAX) 17 g packet Take 17 g by mouth daily as needed for mild constipation. 14 each 0   potassium chloride SA (KLOR-CON M20) 20 MEQ tablet Take 1 tablet (20 mEq total) by mouth daily. 90 tablet 3   torsemide (DEMADEX) 20 MG tablet TAKE 60mg  ONCE DAILY 90 tablet 3   triamcinolone cream (KENALOG) 0.1 % Apply 1 application. topically daily as needed (itching).     No current facility-administered medications for this encounter.   Allergies  Allergen Reactions   Codeine Nausea And Vomiting   Fentanyl Itching    Itching, redness to scalp and neck   Keflex [Cephalexin] Itching and Rash   Social History   Socioeconomic History   Marital status: Widowed    Spouse name: Not on file   Number of children: 2   Years of education: Not on file   Highest education level: Not on file  Occupational History   Occupation: Retired    Comment: Copywriter, advertising: RETIRED  Tobacco Use   Smoking status: Never   Smokeless tobacco: Never  Vaping Use   Vaping status: Never Used  Substance and Sexual Activity   Alcohol use: No    Alcohol/week: 0.0 standard drinks of alcohol   Drug use: No   Sexual activity: Never  Other Topics Concern   Not on file  Social History Narrative   Not on file   Social Determinants of Health   Financial Resource Strain: Not on file  Food Insecurity: No Food Insecurity (08/18/2022)   Hunger Vital Sign    Worried About Running Out of Food in the Last Year: Never true    Ran Out of Food in the Last Year: Never true  Transportation Needs: No Transportation Needs (08/18/2022)   PRAPARE - Administrator, Civil Service (Medical): No    Lack of Transportation (Non-Medical): No  Physical Activity: Not on file  Stress: Not on file  Social Connections: Not on file  Intimate Partner Violence: Not At Risk (08/18/2022)   Humiliation, Afraid, Rape, and Kick questionnaire    Fear of Current or Ex-Partner: No    Emotionally Abused: No     Physically Abused: No    Sexually Abused: No   Family History  Problem Relation Age of Onset   Heart attack Father    Heart attack Brother    Pulse 60   SpO2 94%   Wt Readings from Last 3 Encounters:  11/04/22 74.8 kg (165 lb)  10/29/22 75.5 kg (166 lb 6.4 oz)  10/26/22 74.4 kg (164 lb)   PHYSICAL EXAM: General:  NAD. No resp difficulty, elderly, arrived in [facility] on oxyen HEENT: Normal Neck: Supple. JVP 10. Carotids 2+ bilat; no bruits. No lymphadenopathy or thryomegaly appreciated. Cor: PMI nondisplaced. Regular rate & rhythm. No rubs, gallops or murmurs. Lungs: Crackles RLL  Abdomen: Soft, nontender, nondistended. No hepatosplenomegaly. No bruits or masses. Good bowel sounds. Extremities: No cyanosis, clubbing, rash, 1-2+ BLE edema; unna boots on Neuro: Alert & oriented x 3, cranial nerves grossly intact. Moves all 4 extremities w/o difficulty. Affect pleasant.  Device interrogation (personally reviewed): OptiVol up, thoracic impedence down, no VT, > 99.9% Vpacing, short bursts of AF (longest lasting 10 mins)  ASSESSMENT & PLAN: 1. HFpEF/Pulmonary HTN likely restrictive CM - Echo (3/24): LVEF 50-55%. RV mod HK, RVSP 59. Severe biatrial enlargement. Mod MR. Mod-sev TR (suggestive of restrictive CM) - Likely WHO Group 2/3 PH so not candidate for selective pulmonary artery vasodilators. - Sleep study 7/24 neg for OSA - Goals of therapy are to maintain euvolemia and avoid hypoxemia. - NYHA III (multifactorial). Volume overloaded by exam and OptiVol. Diuresis limited by CKD - ALF will not allow Furoscix - Increase torsemide to 60 mg qam/40 mg qpm, increase KCL to 20 bid. - Continue Farxiga 10 mg daily.  - Continue losartan 100 mg daily - Continue bisoprolol 5 mg daily. Can consider decreasing dose to help with functional capacity, as needed - Not on MRA due to CKD/AKI. May benefit from, cautious use down the road - Given use of Tikosyn, and previous difficulty with metolazone, will  avoid. - Continue UNNA boots x 1 more week, then can d/c and place TED hose. - Labs today, repeat BMET in 7-10 days. - Will ask device RN to send transmission in 1 week to follow OptiVol.   2. Paroxysmal Atrial Fibrillation - Device interrogation 4/24 showed an AF burden of 0.8% and consistent with flutter. She remains on Tikosyn per EP. - Continue Eliquis 2.5 mg bid. Device interrogation as above. - Management per Dr. Diona Browner. - Watch electrolytes very closely with diuresis. ICD in place   3. CAD - s/p CABG in 1998 with LIMA-LAD, SVG-Diagonal, SVG-RI-OM,  - DES to PLA in 2006 - Most recent ischemic evaluation was a low-risk NST in 09/2019. - No chest pain. - Continue bisoprolol and atorvastatin.    4. History of VT - She does have an ICD in place which is followed by Dr. Ladona Ridgel.  - Most recent interrogation in 07/2022 showed HF trends were abnormal from 05/2022 to 06/2022.  - Was also noted to have NSVT and VF of short duration but did not require interventions.    5. HTN - BP controlled - GDMT as above.   6. CKD IV - Creatinine variable from 1.3 to 2.2 this year, averaging around 1.8 - 1.9 more recently.  - Most recent SCr 1.53 (10/26/22) - Continue SGLT2i - Labs today.  Follow up in 3-4 weeks with APP. If volume refractory to escalation of oral diuretics, next step will be IV lasix. Discussed this with [name] [name] and her children.  Jacklynn Ganong, FNP  3:05 PM

## 2022-11-09 NOTE — Patient Instructions (Addendum)
Thank you for coming in today  If you had labs drawn today, any labs that are abnormal the clinic will call you No news is good news  Continue unna boots for another week until 11/20/2022 Then ok to use TED hose after completion of wearing unna boots Place Ted hose on in the morning and off at night    Medications: INCREASE Torsemide to 60 mg every morning and 40 mg every evening no later than 4pm Increase Potassium to 20 meq 1 tablet twice daily   Follow up appointments:  Your physician recommends that you schedule a follow-up appointment in:  4 weeks clinic   Do the following things EVERYDAY: Weigh yourself in the morning before breakfast. Write it down and keep it in a log. Take your medicines as prescribed Eat low salt foods--Limit salt (sodium) to 2000 mg per day.  Stay as active as you can everyday Limit all fluids for the day to less than 2 liters   At the [facility], you and your health needs are our priority. As part of our continuing mission to provide you with exceptional heart care, we have created designated Provider Care Teams. These Care Teams include your primary Cardiologist (physician) and Advanced Practice Providers (APPs- Physician Assistants and Nurse Practitioners) who all work together to provide you with the care you need, when you need it.   You may see any of the following providers on your designated Care Team at your next follow up: Dr Arvilla Meres Dr Marca Ancona Dr. Marcos Eke, NP Robbie Lis, [place] [facility] [place], [place] Brynda Peon, NP Karle Plumber, PharmD   Please be sure to bring in all your medications bottles to every appointment.    Thank you for choosing [facility]  If you have any questions or concerns before your next appointment please send [place] a message through [place] or call our office at [phone].    TO LEAVE A MESSAGE FOR THE  NURSE SELECT OPTION 2, PLEASE LEAVE A MESSAGE INCLUDING: YOUR NAME DATE OF BIRTH CALL BACK NUMBER REASON FOR CALL**this is important as we prioritize the call backs  YOU WILL RECEIVE A CALL BACK THE SAME DAY AS LONG AS YOU CALL BEFORE 4:00 PM

## 2022-11-10 DIAGNOSIS — I130 Hypertensive heart and chronic kidney disease with heart failure and stage 1 through stage 4 chronic kidney disease, or unspecified chronic kidney disease: Secondary | ICD-10-CM | POA: Diagnosis not present

## 2022-11-10 DIAGNOSIS — D631 Anemia in chronic kidney disease: Secondary | ICD-10-CM | POA: Diagnosis not present

## 2022-11-10 DIAGNOSIS — I4901 Ventricular fibrillation: Secondary | ICD-10-CM | POA: Diagnosis not present

## 2022-11-10 DIAGNOSIS — I5033 Acute on chronic diastolic (congestive) heart failure: Secondary | ICD-10-CM | POA: Diagnosis not present

## 2022-11-10 DIAGNOSIS — F419 Anxiety disorder, unspecified: Secondary | ICD-10-CM | POA: Diagnosis not present

## 2022-11-10 DIAGNOSIS — N1832 Chronic kidney disease, stage 3b: Secondary | ICD-10-CM | POA: Diagnosis not present

## 2022-11-10 DIAGNOSIS — I442 Atrioventricular block, complete: Secondary | ICD-10-CM | POA: Diagnosis not present

## 2022-11-10 DIAGNOSIS — I2510 Atherosclerotic heart disease of native coronary artery without angina pectoris: Secondary | ICD-10-CM | POA: Diagnosis not present

## 2022-11-10 DIAGNOSIS — J4489 Other specified chronic obstructive pulmonary disease: Secondary | ICD-10-CM | POA: Diagnosis not present

## 2022-11-10 LAB — CUP PACEART REMOTE DEVICE CHECK
Battery Remaining Longevity: 67 mo
Battery Voltage: 2.98 V
Brady Statistic AP VP Percent: 74.54 %
Brady Statistic AP VS Percent: 0.01 %
Brady Statistic AS VP Percent: 25.43 %
Brady Statistic AS VS Percent: 0.02 %
Brady Statistic RA Percent Paced: 72.73 %
Brady Statistic RV Percent Paced: 99.85 %
Date Time Interrogation Session: 20240722150953
HighPow Impedance: 51 Ohm
HighPow Impedance: 70 Ohm
Implantable Lead Connection Status: 753985
Implantable Lead Connection Status: 753985
Implantable Lead Implant Date: 20030823
Implantable Lead Implant Date: 20030823
Implantable Lead Location: 753859
Implantable Lead Location: 753860
Implantable Lead Model: 157
Implantable Lead Model: 4086
Implantable Lead Serial Number: 108215
Implantable Lead Serial Number: 112190
Implantable Pulse Generator Implant Date: 20230419
Lead Channel Impedance Value: 323 Ohm
Lead Channel Impedance Value: 323 Ohm
Lead Channel Impedance Value: 380 Ohm
Lead Channel Pacing Threshold Amplitude: 1.375 V
Lead Channel Pacing Threshold Pulse Width: 0.4 ms
Lead Channel Sensing Intrinsic Amplitude: 1 mV
Lead Channel Sensing Intrinsic Amplitude: 1 mV
Lead Channel Sensing Intrinsic Amplitude: 15.25 mV
Lead Channel Sensing Intrinsic Amplitude: 15.25 mV
Lead Channel Setting Pacing Amplitude: 2 V
Lead Channel Setting Pacing Amplitude: 2.75 V
Lead Channel Setting Pacing Pulse Width: 0.4 ms
Lead Channel Setting Sensing Sensitivity: 0.3 mV
Zone Setting Status: 755011

## 2022-11-14 DIAGNOSIS — I2510 Atherosclerotic heart disease of native coronary artery without angina pectoris: Secondary | ICD-10-CM | POA: Diagnosis not present

## 2022-11-14 DIAGNOSIS — I4901 Ventricular fibrillation: Secondary | ICD-10-CM | POA: Diagnosis not present

## 2022-11-14 DIAGNOSIS — I130 Hypertensive heart and chronic kidney disease with heart failure and stage 1 through stage 4 chronic kidney disease, or unspecified chronic kidney disease: Secondary | ICD-10-CM | POA: Diagnosis not present

## 2022-11-14 DIAGNOSIS — I5033 Acute on chronic diastolic (congestive) heart failure: Secondary | ICD-10-CM | POA: Diagnosis not present

## 2022-11-14 DIAGNOSIS — D631 Anemia in chronic kidney disease: Secondary | ICD-10-CM | POA: Diagnosis not present

## 2022-11-14 DIAGNOSIS — F419 Anxiety disorder, unspecified: Secondary | ICD-10-CM | POA: Diagnosis not present

## 2022-11-14 DIAGNOSIS — N1832 Chronic kidney disease, stage 3b: Secondary | ICD-10-CM | POA: Diagnosis not present

## 2022-11-14 DIAGNOSIS — I442 Atrioventricular block, complete: Secondary | ICD-10-CM | POA: Diagnosis not present

## 2022-11-14 DIAGNOSIS — J4489 Other specified chronic obstructive pulmonary disease: Secondary | ICD-10-CM | POA: Diagnosis not present

## 2022-11-15 DIAGNOSIS — I5033 Acute on chronic diastolic (congestive) heart failure: Secondary | ICD-10-CM | POA: Diagnosis not present

## 2022-11-15 DIAGNOSIS — J9610 Chronic respiratory failure, unspecified whether with hypoxia or hypercapnia: Secondary | ICD-10-CM | POA: Diagnosis not present

## 2022-11-15 DIAGNOSIS — J449 Chronic obstructive pulmonary disease, unspecified: Secondary | ICD-10-CM | POA: Diagnosis not present

## 2022-11-17 ENCOUNTER — Telehealth: Payer: Self-pay

## 2022-11-17 ENCOUNTER — Ambulatory Visit: Payer: Medicare PPO | Attending: Internal Medicine

## 2022-11-17 DIAGNOSIS — I5032 Chronic diastolic (congestive) heart failure: Secondary | ICD-10-CM | POA: Diagnosis not present

## 2022-11-17 DIAGNOSIS — N1832 Chronic kidney disease, stage 3b: Secondary | ICD-10-CM | POA: Diagnosis not present

## 2022-11-17 DIAGNOSIS — I425 Other restrictive cardiomyopathy: Secondary | ICD-10-CM | POA: Diagnosis not present

## 2022-11-17 DIAGNOSIS — I2729 Other secondary pulmonary hypertension: Secondary | ICD-10-CM | POA: Diagnosis not present

## 2022-11-17 DIAGNOSIS — S80921D Unspecified superficial injury of right lower leg, subsequent encounter: Secondary | ICD-10-CM | POA: Diagnosis not present

## 2022-11-17 DIAGNOSIS — I4901 Ventricular fibrillation: Secondary | ICD-10-CM | POA: Diagnosis not present

## 2022-11-17 DIAGNOSIS — Z95810 Presence of automatic (implantable) cardiac defibrillator: Secondary | ICD-10-CM | POA: Diagnosis not present

## 2022-11-17 DIAGNOSIS — D631 Anemia in chronic kidney disease: Secondary | ICD-10-CM | POA: Diagnosis not present

## 2022-11-17 DIAGNOSIS — I5033 Acute on chronic diastolic (congestive) heart failure: Secondary | ICD-10-CM | POA: Diagnosis not present

## 2022-11-17 DIAGNOSIS — S80922D Unspecified superficial injury of left lower leg, subsequent encounter: Secondary | ICD-10-CM | POA: Diagnosis not present

## 2022-11-17 DIAGNOSIS — I130 Hypertensive heart and chronic kidney disease with heart failure and stage 1 through stage 4 chronic kidney disease, or unspecified chronic kidney disease: Secondary | ICD-10-CM | POA: Diagnosis not present

## 2022-11-17 NOTE — Telephone Encounter (Signed)
Spoke with son, Jonny Ruiz per [employer].  Explained Jessica from HF clinic has requested a remote transmission for today so that she can review fluid levels.  He stated patient is ALF and he will send a remote transmission tomorrow when he visits her.  He said she is doing well and thinks the fluid levels should be improving.  Advised will call with results once transmission is received and reviewed.

## 2022-11-18 NOTE — Telephone Encounter (Signed)
Transmission received.  See ICM note.

## 2022-11-18 NOTE — Progress Notes (Signed)
EPIC Encounter for ICM Monitoring  Patient Name: [name] [name] is a 89 y.o. female Date: 11/18/2022 Primary Care Physican: [name], MD Primary Cardiologist: Bensimhon Electrophysiologist: Ladona Ridgel   Since 09-Nov-2022 VT-NS (>4 beats, >171 bpm) 2 AT/AF  5 Time in AT/AF  <0.1 hr/day (0.2%) Longest AT/AF 13 minutes       ICM check as requested by Prince Rome, NP HF clinic following 7/22 OV.     Spoke with son Nuvia Uhr per [place].  Heart Failure questions reviewed.  She does continue to have some leg swelling but has improved.     Optivol thoracic impedance suggesting fluid levels returned to normal after Torsemide dosage increased to 60 mg AM and 40 mg PM at 11/05/2022 OV.   Prescribed:  Torsemide 20 mg take 3 tablet(s) (60 mg total) by mouth every morning and 2 tablets by mouth every evening. Potassium 20 mEq take 1 tablet(s) (20 mEq total) by mouth twice a day.  Recommendations: Advised to call HF clinic for any changes in condition.    Follow-up plan: No further ICM clinic appointments scheduled.   91 day device clinic remote transmission 02/05/2023.    EP/Cardiology Office Visits: 12/07/2022 with HF clinic.    Copy of ICM check sent to Dr. Ladona Ridgel and Prince Rome, NP as requested.    3 month ICM trend: 11/18/2022.    12-14 Month ICM trend:     Karie Soda, RN 11/18/2022 3:29 PM

## 2022-11-19 NOTE — Progress Notes (Signed)
Remote ICD transmission.   

## 2022-11-20 ENCOUNTER — Telehealth: Payer: Self-pay | Admitting: *Deleted

## 2022-11-20 ENCOUNTER — Other Ambulatory Visit (HOSPITAL_COMMUNITY): Payer: Self-pay | Admitting: Family Medicine

## 2022-11-20 DIAGNOSIS — I5033 Acute on chronic diastolic (congestive) heart failure: Secondary | ICD-10-CM | POA: Diagnosis not present

## 2022-11-20 DIAGNOSIS — S80921D Unspecified superficial injury of right lower leg, subsequent encounter: Secondary | ICD-10-CM | POA: Diagnosis not present

## 2022-11-20 DIAGNOSIS — I425 Other restrictive cardiomyopathy: Secondary | ICD-10-CM | POA: Diagnosis not present

## 2022-11-20 DIAGNOSIS — N1832 Chronic kidney disease, stage 3b: Secondary | ICD-10-CM | POA: Diagnosis not present

## 2022-11-20 DIAGNOSIS — I4901 Ventricular fibrillation: Secondary | ICD-10-CM | POA: Diagnosis not present

## 2022-11-20 DIAGNOSIS — I2729 Other secondary pulmonary hypertension: Secondary | ICD-10-CM | POA: Diagnosis not present

## 2022-11-20 DIAGNOSIS — I130 Hypertensive heart and chronic kidney disease with heart failure and stage 1 through stage 4 chronic kidney disease, or unspecified chronic kidney disease: Secondary | ICD-10-CM | POA: Diagnosis not present

## 2022-11-20 DIAGNOSIS — S80922D Unspecified superficial injury of left lower leg, subsequent encounter: Secondary | ICD-10-CM | POA: Diagnosis not present

## 2022-11-20 DIAGNOSIS — I5032 Chronic diastolic (congestive) heart failure: Secondary | ICD-10-CM | POA: Diagnosis not present

## 2022-11-20 DIAGNOSIS — D631 Anemia in chronic kidney disease: Secondary | ICD-10-CM | POA: Diagnosis not present

## 2022-11-20 NOTE — Telephone Encounter (Signed)
The patient has been notified of the result via mychart. Latrelle Dodrill, CMA 11/20/2022 4:03 PM

## 2022-11-20 NOTE — Telephone Encounter (Signed)
-----   Message from Armanda Magic sent at 11/06/2022 10:08 AM EDT ----- Please let patient know that sleep study showed no significant sleep apnea.

## 2022-11-23 ENCOUNTER — Telehealth: Payer: Self-pay

## 2022-11-23 NOTE — Telephone Encounter (Signed)
Fax received from [place] asking for order to increase patient's O2 liter flow. Note says [name] [name] has increased her flow.  Spoke with [name] [name] [phone]) and they state they currently have patient on 2L continuous and have not changed her flow. They report good consistent O2 levels with no desats noted. They advise patient has been having some shob and cardiology increased her Torsemide to 60mg  QAM and 40mg  QPM, changed on 11/10/22. They report some instances of seemingly labored breathing but no documented desats.   Recommended OV to assess patient, nurse advised calling [name] to discuss/arrange.  Spoke with son, [name] [name] ([place]), he states he does not feel patient needs an increase in O2 at this time. He is working with cardiology/H&V to assess fluid levels, which he is aware can contribute to shob sensation. To his knowledge patient is not experiencing desats. He will be visiting patient tomorrow and will let us know if they need further assistance. Nothing further needed at this time.

## 2022-11-27 ENCOUNTER — Encounter: Payer: Self-pay | Admitting: Cardiology

## 2022-12-02 ENCOUNTER — Ambulatory Visit: Payer: Medicare PPO | Attending: Student | Admitting: Student

## 2022-12-02 ENCOUNTER — Encounter: Payer: Self-pay | Admitting: Student

## 2022-12-02 VITALS — BP 104/64 | HR 64 | Ht 60.0 in | Wt 161.8 lb

## 2022-12-02 DIAGNOSIS — Z95810 Presence of automatic (implantable) cardiac defibrillator: Secondary | ICD-10-CM | POA: Diagnosis not present

## 2022-12-02 DIAGNOSIS — I5032 Chronic diastolic (congestive) heart failure: Secondary | ICD-10-CM | POA: Diagnosis not present

## 2022-12-02 DIAGNOSIS — I2510 Atherosclerotic heart disease of native coronary artery without angina pectoris: Secondary | ICD-10-CM | POA: Diagnosis not present

## 2022-12-02 DIAGNOSIS — I480 Paroxysmal atrial fibrillation: Secondary | ICD-10-CM | POA: Diagnosis not present

## 2022-12-02 DIAGNOSIS — N1832 Chronic kidney disease, stage 3b: Secondary | ICD-10-CM

## 2022-12-02 DIAGNOSIS — E785 Hyperlipidemia, unspecified: Secondary | ICD-10-CM | POA: Diagnosis not present

## 2022-12-02 MED ORDER — LOSARTAN POTASSIUM 50 MG PO TABS: 50.0000 mg | ORAL_TABLET | Freq: Every day | ORAL | 3 refills | Status: DC

## 2022-12-02 NOTE — Progress Notes (Signed)
Cardiology Office Note    Date:  12/02/2022  ID:  [name] [name], DOB Nov 16, 1931, MRN [id number] Cardiologist: Nona Dell, MD   AHF: Dr. Gala Romney  History of Present Illness:    [name] [name] is a 89 y.o. female with past medical history of CAD (s/p CABG in 1998 with LIMA-LAD, SVG-Diagonal, SVG-RI-OM, DES to PLA in 2006, low-risk NST in 09/2019), HFpEF, pulmonary HTN, chronic hypoxic respiratory failure/COPD, paroxysmal atrial fibrillation, history of VT (s/p ICD placement), HTN, HLD and Stage 3 CKD who presents to the office today for 1 month follow-up.  She was examined by Dr. Diona Browner in 10/2022 and had recently been evaluated by [facility] with titration of Torsemide to 60 mg daily and using Unna boots. No changes were made to her cardiac medications at that time. She did follow-up with A[facility] on 11/09/2022 and reported still having dyspnea with activity and her weight was at 163 lbs. She was volume overloaded by examination and OptiVol readings. Torsemide was further titrated to 60 mg in AM/40 mg in PM and she was continued on Farxiga, Losartan and Bisoprolol. Labs on 11/20/2022 showed her creatinine had increased from 1.53 to 1.75 and electrolytes were within a normal range.  In talking with the patient and her son today, she reports having baseline dyspnea with minimal activity which has improved some with titration of diuretics. Reports her edema did improve with the use of Unna boots and she is now wearing compression stockings.  She is frustrated about the amount of urination given her current diuretics. Denies any specific chest pain or palpitations. No orthopnea or PND. Her BP is soft at 102/58 on initial check today and at 104/64 on recheck. They report similar readings over the past month as well.  Studies Reviewed:   EKG: EKG is not ordered today.  Echocardiogram: 06/2022 IMPRESSIONS     1. Left ventricular ejection fraction, by estimation, is 50 to 55%. Left   ventricular ejection fraction by 3D volume is 50 %. The left ventricle has  low normal function. The left ventricle has no regional wall motion  abnormalities. Left ventricular  diastolic function could not be evaluated.   2. Right ventricular systolic function is moderately reduced. The right  ventricular size is moderately enlarged. There is moderately elevated  pulmonary artery systolic pressure. The estimated right ventricular  systolic pressure is 59.1 mmHg.   3. Left atrial size was severely dilated.   4. Right atrial size was severely dilated.   5. The mitral valve is normal in structure. Mild to moderate mitral valve  regurgitation.   6. Tricuspid valve regurgitation is moderate to severe.   7. The aortic valve is tricuspid. There is mild calcification of the  aortic valve. There is mild thickening of the aortic valve. Aortic valve  regurgitation is mild. Aortic valve sclerosis is present, with no evidence  of aortic valve stenosis.   8. The inferior vena cava is dilated in size with <50% respiratory  variability, suggesting right atrial pressure of 15 mmHg.   Comparison(s): Prior images reviewed side by side. Tricuspid regurgitation  is much worse and right ventricular dilation and systolic dysfunction is  now present.   Risk Assessment/Calculations:    CHA2DS2-VASc Score = 6   This indicates a 9.7% annual risk of stroke. The patient's score is based upon: CHF History: 1 HTN History: 1 Diabetes History: 0 Stroke History: 0 Vascular Disease History: 1 Age Score: 2 Gender Score: 1  Physical Exam:   VS:  BP 104/64   Pulse 64   Ht 5' (1.524 m)   Wt 161 lb 12.8 oz (73.4 kg)   SpO2 92%   BMI 31.60 kg/m    Wt Readings from Last 3 Encounters:  12/02/22 161 lb 12.8 oz (73.4 kg)  11/09/22 163 lb 9.6 oz (74.2 kg)  11/04/22 165 lb (74.8 kg)     GEN: Pleasant, elderly female appearing in no acute distress. Sitting in wheelchair. NECK: No JVD; No carotid  bruits CARDIAC: RRR, no murmurs, rubs, gallops RESPIRATORY:  Clear to auscultation without rales, wheezing or rhonchi  ABDOMEN: Appears non-distended. No obvious abdominal masses. EXTREMITIES: No clubbing or cyanosis. 1+ pitting edema. Compression stockings in place.   Distal pedal pulses are 2+ bilaterally.   Assessment and Plan:   1. CAD - She did undergo CABG in 1998 with LIMA-LAD, SVG-Diagonal, SVG-RI-OM, DES to PLA in 2006 with NST in 09/2019 being low-risk.  - She has baseline dyspnea on exertion but denies any recent chest pain. No plans for further ischemic testing at this time. Continue Atorvastatin 20 mg daily and Bisoprolol 5 mg daily. She is no longer on ASA given the need for anticoagulation.  2. Chronic HFpEF/Pulmonary HTN - She is also being followed by [facility]. Recently underwent a sleep study which was negative for OSA. - While she still has dyspnea, her volume status has improved since her last evaluation and weight has declined by 4 pounds within the past month. Her lower extremity edema has improved as well. She is currently taking Torsemide 60 mg in AM/40 mg in PM and is scheduled for follow-up labs with A[facility] next week. Continue Bisoprolol 5 mg daily and Farxiga 10 mg daily. Given her soft BP and fatigue, will reduce Losartan to 50 mg daily.  3. Paroxysmal Atrial Fibrillation - Device interrogation in 10/2022 showed an atrial fibrillation burden of 0.5%. She denies any recent palpitations and she is in normal sinus rhythm by examination today.  Continue Bisoprolol 5 mg daily and Tikosyn 125 mcg twice daily. - No reports of active bleeding. She remains on Eliquis 2.5 mg twice daily for anticoagulation which is the appropriate dose at this time given her age of 89 yo and creatinine at 1.75 by most recent labs.  4. History of VT - Device interrogation last month showed 36 episodes of NSVT but she did not require therapy. Her device is followed by Dr.  Ladona Ridgel.  5. Stage 3-4 CKD - Her creatinine has been significantly variable this year due to fluctuations in diuretic therapy and has been averaging around 1.8 - 1.9. This had improved to 1.40 in 10/2022 but was at 1.75 on 11/20/2022. She is scheduled for repeat labs next week per their report.  6. HLD - LDL was at 58 in 02/2022.  Continue current medical therapy with Atorvastatin 20mg  daily.   Signed, Ellsworth Lennox, PA-C

## 2022-12-02 NOTE — Patient Instructions (Signed)
Medication Instructions:  Your physician has recommended you make the following change in your medication:   Decrease Losartan to 50 mg Daily   *If you need a refill on your cardiac medications before your next appointment, please call your pharmacy*   Lab Work: NONE   If you have labs (blood work) drawn today and your tests are completely normal, you will receive your results only by: MyChart Message (if you have MyChart) OR A paper copy in the mail If you have any lab test that is abnormal or we need to change your treatment, we will call you to review the results.   Testing/Procedures: NONE    Follow-Up: At [facility], you and your health needs are our priority.  As part of our continuing mission to provide you with exceptional heart care, we have created designated Provider Care Teams.  These Care Teams include your primary Cardiologist (physician) and Advanced Practice Providers (APPs -  Physician Assistants and Nurse Practitioners) who all work together to provide you with the care you need, when you need it.  We recommend signing up for the patient portal called "MyChart".  Sign up information is provided on this After Visit Summary.  MyChart is used to connect with patients for Virtual Visits (Telemedicine).  Patients are able to view lab/test results, encounter notes, upcoming appointments, etc.  Non-urgent messages can be sent to your provider as well.   To learn more about what you can do with MyChart, go to [email or web address].    Your next appointment:   2 month(s)  Provider:   You may see Nona Dell, MD or one of the following Advanced Practice Providers on your designated Care Team:   Randall An, PA-C  Jacolyn Reedy, PA-C     Other Instructions Thank you for choosing [facility]!

## 2022-12-04 NOTE — Progress Notes (Signed)
ADVANCED HF CLINIC NOTE  PCP: [name], MD Primary Cardiologist: Nona Dell, MD HF Cardiologist: Dr. Gala Romney  HPI: [name] [name] is a. 89 y.o.[name] female (never smoker) with CAD (s/p CABG in 1998 with LIMA-LAD, SVG-Diagonal, SVG-RI-OM, DES to PLA in 2006, low-risk NST in 09/2019), HFpEF, pulmonary HTN, chronic hypoxic respiratory failure/COPD, pPAF, history of VT (s/p ICD placement) and Stage 3 CKD who is referred by Dr. Diona Browner for further evaluation of her pulmonary HTN,   PFTs 2/24 FEV1 0.79 (57%) FVC 1.30 (68%) DLCO 46%   Echo 3/24: LVEF 50-55%, RV mod HK, RVSP 59, severe biatrial enlargement, mod MR, mod-sev TR  In 5/24, labs had shown her creatinine had increased to 2.26, therefore once weekly metolazone was discontinued and torsemide was reduced to 40 mg daily. She had only been taking Farxiga 5 mg daily and this was titrated to 10 mg daily.   Initial visit with Dr. Gala Romney 10/26/22, she had increased weakness over past 6 months. Markedly volume overloaded, and diuresis complicated by CKD. Residing at [facility], unable to use Furoscix. Torsemide increased to 60 bid x 2 days, then 60 daily thereafter and Unna boots arranged.  Today she returns for close HF follow up with her son and daughter. Overall feeling fair. She remains SOB with any activity. She can walk 50 feet with her rolling walker without significant dyspnea. Has unna boots on, feels she still has fluid on board. She is on oxygen continuously. Denies palpitations, CP, dizziness,or PND/Orthopnea. Appetite poor. No fever or chills. Taking all medications provided by facility.  Today she returns for HF follow up with her son and daughter. She remains SOB walking short distances with her rolling walker, has to stop after walking 50 feet. She remains on 3L oxygen. Legs are still swelling.  Denies increasing SOB, CP, dizziness, edema, or PND/Orthopnea. Appetite ok. No fever or chills. Weight at home  158-162 pounds.  Taking all medications.    Cardiac Studies - Echo (3/24): EF 50-55%, RV mod HK, RVSP 59, severe biatrial enlargement, mod MR, mod-sev TR  Past Medical History:  Diagnosis Date   Anxiety    Asthma    Chronic atrial fibrillation (HCC)    Chronic back pain    Chronic diastolic heart failure (HCC)    Chronic obstructive pulmonary disease, unspecified (HCC)    Complete heart block (HCC)    COPD (chronic obstructive pulmonary disease) (HCC)    Coronary atherosclerosis of native coronary artery    a. s/p CABG in 1998 with LIMA-LAD, SVG-Diagonal, SVG-RI-OM b. DES to PLA in 2006   DDD (degenerative disc disease), lumbar    Essential hypertension    Headache    ICD (implantable cardioverter-defibrillator) in place    Mixed hyperlipidemia    Osteoarthritis    Ventricular fibrillation (HCC) 2003   a. seen on PPM interrogation a/w syncope   Current Outpatient Medications  Medication Sig Dispense Refill   acetaminophen (TYLENOL) 325 MG tablet Take 2 tablets (650 mg total) by mouth every 6 (six) hours as needed for mild pain (or Fever >/= 101). 100 tablet 2   albuterol (VENTOLIN HFA) 108 (90 Base) MCG/ACT inhaler Inhale 2 puffs into the lungs every 6 (six) hours as needed for wheezing or shortness of breath. 18 g 2   apixaban (ELIQUIS) 2.5 MG TABS tablet Take 1 tablet (2.5 mg total) by mouth 2 (two) times daily. 60 tablet 4   atorvastatin (LIPITOR) 20 MG tablet Take 1 tablet (20 mg total)  by mouth daily. 90 tablet 3   bisoprolol (ZEBETA) 5 MG tablet Take 1 tablet (5 mg total) by mouth daily. 30 tablet 4   Calcium Carbonate-Vitamin D (CALCIUM 600 + D PO) Take 1 tablet by mouth 2 (two) times daily.     dapagliflozin propanediol (FARXIGA) 10 MG TABS tablet Take 1 tablet (10 mg total) by mouth daily before breakfast. 30 tablet 6   dofetilide (TIKOSYN) 125 MCG capsule Take 1 capsule (125 mcg total) by mouth 2 (two) times daily. 180 capsule 2   Fluticasone-Umeclidin-Vilant (TRELEGY ELLIPTA)  100-62.5-25 MCG/ACT AEPB Inhale 1 puff into the lungs daily. 60 each 6   folic acid (FOLVITE) 1 MG tablet Take 1 tablet (1 mg total) by mouth daily. 30 tablet 3   losartan (COZAAR) 50 MG tablet Take 1 tablet (50 mg total) by mouth daily. 90 tablet 3   Polyethyl Glycol-Propyl Glycol (SYSTANE OP) Place 1 drop into both eyes daily as needed (Dry eyes).     polyethylene glycol (MIRALAX / GLYCOLAX) 17 g packet Take 17 g by mouth daily as needed for mild constipation. 14 each 0   potassium chloride SA (KLOR-CON M20) 20 MEQ tablet Take 1 tablet (20 mEq total) by mouth 2 (two) times daily. 60 tablet 6   torsemide (DEMADEX) 20 MG tablet Take 3 tablets (60 mg total) by mouth in the morning AND 2 tablets (40 mg total) every evening. TAKE 60mg  ONCE DAILY. 150 tablet 6   triamcinolone cream (KENALOG) 0.1 % Apply 1 application. topically daily as needed (itching).     No current facility-administered medications for this encounter.   Allergies  Allergen Reactions   Codeine Nausea And Vomiting   Fentanyl Itching    Itching, redness to scalp and neck   Keflex [Cephalexin] Itching and Rash   Social History   Socioeconomic History   Marital status: Widowed    Spouse name: Not on file   Number of children: 2   Years of education: Not on file   Highest education level: Not on file  Occupational History   Occupation: Retired    Comment: Copywriter, advertising: RETIRED  Tobacco Use   Smoking status: Never   Smokeless tobacco: Never  Vaping Use   Vaping status: Never Used  Substance and Sexual Activity   Alcohol use: No    Alcohol/week: 0.0 standard drinks of alcohol   Drug use: No   Sexual activity: Never  Other Topics Concern   Not on file  Social History Narrative   Not on file   Social Determinants of Health   Financial Resource Strain: Not on file  Food Insecurity: No Food Insecurity (08/18/2022)   Hunger Vital Sign    Worried About Running Out of Food in the Last Year: Never  true    Ran Out of Food in the Last Year: Never true  Transportation Needs: No Transportation Needs (08/18/2022)   PRAPARE - Administrator, Civil Service (Medical): No    Lack of Transportation (Non-Medical): No  Physical Activity: Not on file  Stress: Not on file  Social Connections: Not on file  Intimate Partner Violence: Not At Risk (08/18/2022)   Humiliation, Afraid, Rape, and Kick questionnaire    Fear of Current or Ex-Partner: No    Emotionally Abused: No    Physically Abused: No    Sexually Abused: No   Family History  Problem Relation Age of Onset   Heart attack Father  Heart attack Brother    BP 94/66   Pulse 63   Wt 72.9 kg (160 lb 12.8 oz)   SpO2 91%   BMI 31.40 kg/m   Wt Readings from Last 3 Encounters:  12/07/22 72.9 kg (160 lb 12.8 oz)  12/02/22 73.4 kg (161 lb 12.8 oz)  11/09/22 74.2 kg (163 lb 9.6 oz)   PHYSICAL EXAM: General:  NAD. No resp difficulty, elderly, arrived in [facility] on oxyen HEENT: Normal Neck: Supple. JVP 10. Carotids 2+ bilat; no bruits. No lymphadenopathy or thryomegaly appreciated. Cor: PMI nondisplaced. Regular rate & rhythm. No rubs, gallops or murmurs. Lungs: Crackles RLL Abdomen: Soft, nontender, nondistended. No hepatosplenomegaly. No bruits or masses. Good bowel sounds. Extremities: No cyanosis, clubbing, rash, 1-2+ BLE edema; unna boots on Neuro: Alert & oriented x 3, cranial nerves grossly intact. Moves all 4 extremities w/o difficulty. Affect pleasant.  Device interrogation (personally reviewed): OptiVol stable, thoracic impedence just below reference line, no VT, > 99.9% Vpacing, short bursts of AF (longest lasting 13 mins), 0/1 hr/day activity  ASSESSMENT & PLAN: 1. HFpEF/Pulmonary HTN likely restrictive CM - Echo (3/24): LVEF 50-55%. RV mod HK, RVSP 59. Severe biatrial enlargement. Mod MR. Mod-sev TR (suggestive of restrictive CM) - Likely WHO Group 2/3 PH so not candidate for selective pulmonary artery  vasodilators. - Sleep study 7/24 neg for OSA - Goals of therapy are to maintain euvolemia and avoid hypoxemia. - NYHA III (multifactorial, suspect symptoms more respiratory driven vs HF). Volume improved on exam and by OptiVol, still has mild LEE. Diuresis limited by CKD - ALF will not allow Furoscix - Increase torsemide to 100 mg qam/40 mg q pm x 3 days, then back to 60/40 - Increase KCL to 40 bid x 3 days, then back to 20 bid. - Decrease bisoprolol to 2.5 mg daily to allow more diuresis and help with functional capacity. - Continue Farxiga 10 mg daily.  - Continue losartan 50 mg daily (recently reduced with low BP) - Not on MRA due to CKD/AKI. May benefit from, cautious use down the road - Given use of Tikosyn, and previous difficulty with metolazone, will avoid. - Continue TED hose. - Labs today  - Progressive dyspnea, we discussed RHC but suspect this will not change our management (WHO group 2/3 so no selective pulmonary vasodilators). She does not want to undergo any invasive procedures.   2. Paroxysmal Atrial Fibrillation - Device interrogation 4/24 showed an AF burden of 0.8% and consistent with flutter. She remains on Tikosyn per EP. - Continue Eliquis 2.5 mg bid. Device interrogation as above. - Management per Dr. Diona Browner. - Watch electrolytes very closely with diuresis. ICD in place   3. CAD - s/p CABG in 1998 with LIMA-LAD, SVG-Diagonal, SVG-RI-OM,  - DES to PLA in 2006 - Most recent ischemic evaluation was a low-risk NST in 09/2019. - No chest pain. - Continue bisoprolol and atorvastatin.    4. History of VT - She does have an ICD in place which is followed by Dr. Ladona Ridgel.  - Most recent interrogation in 07/2022 showed HF trends were abnormal from 05/2022 to 06/2022.  - Was also noted to have NSVT and VF of short duration but did not require interventions.    5. HTN - BP soft - Decrease bisoprolol as above   6. CKD IV - Creatinine variable from 1.3 to 2.2 this year,  averaging around 1.8 - 1.9 more recently.  - Most recent SCr 1.53 (10/26/22) - Continue SGLT2i - Labs  today.  7. COPD with chronic hypoxic respiratory failure - Follows with Dr. Sherene Sires - Suspect main driver of her symptoms - Continue home oxygen - Refer to Pulmonary Rehab  Follow up in 4 months with Dr. Gala Romney   Jacklynn Ganong, FNP  2:40 PM

## 2022-12-07 ENCOUNTER — Ambulatory Visit (HOSPITAL_COMMUNITY)
Admission: RE | Admit: 2022-12-07 | Discharge: 2022-12-07 | Disposition: A | Discharge status: Home / Self Care | Payer: Medicare PPO | Source: Ambulatory Visit | Attending: Family Medicine | Admitting: Family Medicine

## 2022-12-07 ENCOUNTER — Encounter (HOSPITAL_COMMUNITY): Payer: Self-pay

## 2022-12-07 VITALS — BP 94/66 | HR 63 | Wt 160.8 lb

## 2022-12-07 DIAGNOSIS — R0609 Other forms of dyspnea: Secondary | ICD-10-CM | POA: Diagnosis not present

## 2022-12-07 DIAGNOSIS — R5383 Other fatigue: Secondary | ICD-10-CM | POA: Insufficient documentation

## 2022-12-07 DIAGNOSIS — N184 Chronic kidney disease, stage 4 (severe): Secondary | ICD-10-CM | POA: Insufficient documentation

## 2022-12-07 DIAGNOSIS — Z95810 Presence of automatic (implantable) cardiac defibrillator: Secondary | ICD-10-CM | POA: Diagnosis not present

## 2022-12-07 DIAGNOSIS — J9611 Chronic respiratory failure with hypoxia: Secondary | ICD-10-CM | POA: Diagnosis not present

## 2022-12-07 DIAGNOSIS — J449 Chronic obstructive pulmonary disease, unspecified: Secondary | ICD-10-CM | POA: Insufficient documentation

## 2022-12-07 DIAGNOSIS — I272 Pulmonary hypertension, unspecified: Secondary | ICD-10-CM | POA: Diagnosis not present

## 2022-12-07 DIAGNOSIS — I4729 Other ventricular tachycardia: Secondary | ICD-10-CM

## 2022-12-07 DIAGNOSIS — I480 Paroxysmal atrial fibrillation: Secondary | ICD-10-CM | POA: Insufficient documentation

## 2022-12-07 DIAGNOSIS — I5032 Chronic diastolic (congestive) heart failure: Secondary | ICD-10-CM

## 2022-12-07 DIAGNOSIS — I130 Hypertensive heart and chronic kidney disease with heart failure and stage 1 through stage 4 chronic kidney disease, or unspecified chronic kidney disease: Secondary | ICD-10-CM | POA: Insufficient documentation

## 2022-12-07 DIAGNOSIS — I10 Essential (primary) hypertension: Secondary | ICD-10-CM | POA: Diagnosis not present

## 2022-12-07 DIAGNOSIS — R0602 Shortness of breath: Secondary | ICD-10-CM | POA: Diagnosis not present

## 2022-12-07 DIAGNOSIS — I2510 Atherosclerotic heart disease of native coronary artery without angina pectoris: Secondary | ICD-10-CM | POA: Insufficient documentation

## 2022-12-07 LAB — CBC
HCT: 38.6 % (ref 36.0–46.0)
Hemoglobin: 12.1 g/dL (ref 12.0–15.0)
MCH: 29.2 pg (ref 26.0–34.0)
MCHC: 31.3 g/dL (ref 30.0–36.0)
MCV: 93.2 fL (ref 80.0–100.0)
Platelets: 214 10*3/uL (ref 150–400)
RBC: 4.14 MIL/uL (ref 3.87–5.11)
RDW: 17.9 % — ABNORMAL HIGH (ref 11.5–15.5)
WBC: 10.4 10*3/uL (ref 4.0–10.5)
nRBC: 0 % (ref 0.0–0.2)

## 2022-12-07 LAB — BASIC METABOLIC PANEL
Anion gap: 12 (ref 5–15)
BUN: 41 mg/dL — ABNORMAL HIGH (ref 8–23)
CO2: 29 mmol/L (ref 22–32)
Calcium: 9.5 mg/dL (ref 8.9–10.3)
Chloride: 98 mmol/L (ref 98–111)
Creatinine, Ser: 1.99 mg/dL — ABNORMAL HIGH (ref 0.44–1.00)
GFR, Estimated: 24 mL/min — ABNORMAL LOW (ref >60)
Glucose, Bld: 106 mg/dL — ABNORMAL HIGH (ref 70–99)
Potassium: 4.6 mmol/L (ref 3.5–5.1)
Sodium: 139 mmol/L (ref 135–145)

## 2022-12-07 LAB — BRAIN NATRIURETIC PEPTIDE: B Natriuretic Peptide: 864.9 pg/mL — ABNORMAL HIGH (ref 0.0–100.0)

## 2022-12-07 LAB — IRON AND TIBC
Iron: 34 ug/dL (ref 28–170)
Saturation Ratios: 6 % — ABNORMAL LOW (ref 10.4–31.8)
TIBC: 568 ug/dL — ABNORMAL HIGH (ref 250–450)
UIBC: 534 ug/dL

## 2022-12-07 LAB — FERRITIN: Ferritin: 43 ng/mL (ref 11–307)

## 2022-12-07 MED ORDER — BISOPROLOL FUMARATE 5 MG PO TABS: 2.5000 mg | ORAL_TABLET | Freq: Every day | ORAL | 4 refills | Status: DC

## 2022-12-07 NOTE — Patient Instructions (Addendum)
Labs done today. We will contact you only if your labs are abnormal.  DECREASE Bisoprolol to 2.5mg  (1/2 tablet) by mouth daily.   Take an extra Torsemide 40mg  (2 tablets) by mouth daily for 3 days.THEN DECREASE back down to your regular dosage.   Take an extra Potassium 67meq(2 tablets) by mouth daily for 3 days. THEN DECREASE back down to your regular dosage.   No other medication changes were made. Please continue all current medications as prescribed.  You have been referred to pulmonary rehab. They will contact you to schedule an appointment.   Your physician recommends that you schedule a follow-up appointment in: 4 months with Dr. Gala Romney.   If you have any questions or concerns before your next appointment please send [place] a message through [place] or call our office at [phone].    TO LEAVE A MESSAGE FOR THE NURSE SELECT OPTION 2, PLEASE LEAVE A MESSAGE INCLUDING: YOUR NAME DATE OF BIRTH CALL BACK NUMBER REASON FOR CALL**this is important as we prioritize the call backs  YOU WILL RECEIVE A CALL BACK THE SAME DAY AS LONG AS YOU CALL BEFORE 4:00 PM   Do the following things EVERYDAY: Weigh yourself in the morning before breakfast. Write it down and keep it in a log. Take your medicines as prescribed Eat low salt foods--Limit salt (sodium) to 2000 mg per day.  Stay as active as you can everyday Limit all fluids for the day to less than 2 liters   At the [facility], you and your health needs are our priority. As part of our continuing mission to provide you with exceptional heart care, we have created designated Provider Care Teams. These Care Teams include your primary Cardiologist (physician) and Advanced Practice Providers (APPs- Physician Assistants and Nurse Practitioners) who all work together to provide you with the care you need, when you need it.   You may see any of the following providers on your designated Care Team at your next follow up: Dr  Arvilla Meres Dr Marca Ancona Dr. Marcos Eke, NP Robbie Lis, [place] [facility] [place], [place] Brynda Peon, NP Karle Plumber, PharmD   Please be sure to bring in all your medications bottles to every appointment.    Thank you for choosing [facility]

## 2022-12-08 ENCOUNTER — Other Ambulatory Visit (HOSPITAL_COMMUNITY): Payer: Self-pay | Admitting: *Deleted

## 2022-12-08 DIAGNOSIS — I272 Pulmonary hypertension, unspecified: Secondary | ICD-10-CM

## 2022-12-08 NOTE — Addendum Note (Signed)
Encounter addended by: Jacklynn Ganong, FNP on: 12/08/2022 8:10 AM  Actions taken: Alternative orders not taken and original order placed, Order list changed

## 2022-12-16 ENCOUNTER — Ambulatory Visit (HOSPITAL_COMMUNITY)
Admission: RE | Admit: 2022-12-16 | Discharge: 2022-12-16 | Disposition: A | Discharge status: Home / Self Care | Payer: Medicare PPO | Source: Ambulatory Visit | Attending: Internal Medicine | Admitting: Internal Medicine

## 2022-12-16 DIAGNOSIS — I5033 Acute on chronic diastolic (congestive) heart failure: Secondary | ICD-10-CM | POA: Diagnosis not present

## 2022-12-16 DIAGNOSIS — I272 Pulmonary hypertension, unspecified: Secondary | ICD-10-CM | POA: Insufficient documentation

## 2022-12-16 DIAGNOSIS — J449 Chronic obstructive pulmonary disease, unspecified: Secondary | ICD-10-CM | POA: Diagnosis not present

## 2022-12-16 DIAGNOSIS — J9610 Chronic respiratory failure, unspecified whether with hypoxia or hypercapnia: Secondary | ICD-10-CM | POA: Diagnosis not present

## 2022-12-17 ENCOUNTER — Encounter (HOSPITAL_COMMUNITY): Payer: Self-pay

## 2022-12-17 ENCOUNTER — Encounter (HOSPITAL_COMMUNITY)
Admission: RE | Admit: 2022-12-17 | Discharge: 2022-12-17 | Disposition: A | Discharge status: Home / Self Care | Payer: Medicare PPO | Source: Ambulatory Visit | Attending: Internal Medicine | Admitting: Internal Medicine

## 2022-12-17 ENCOUNTER — Other Ambulatory Visit: Payer: Self-pay | Admitting: Pharmacy Technician

## 2022-12-17 VITALS — BP 92/48 | HR 63 | Ht 60.0 in | Wt 156.5 lb

## 2022-12-17 DIAGNOSIS — I272 Pulmonary hypertension, unspecified: Secondary | ICD-10-CM | POA: Diagnosis not present

## 2022-12-17 NOTE — Patient Instructions (Signed)
Patient Instructions  Patient Details  Name: [name] [name] MRN: [id number] Date of Birth: May 29, 1931 Referring Provider:  Nyoka Cowden, MD  Below are your personal goals for exercise, nutrition, and risk factors. Our goal is to help you stay on track towards obtaining and maintaining these goals. We will be discussing your progress on these goals with you throughout the program.  Initial Exercise Prescription:  Initial Exercise Prescription - 12/17/22 1500       Date of Initial Exercise RX and Referring Provider   Date 12/17/22    Referring Provider Wallie Char MD      Oxygen   Oxygen Continuous    Liters 3    Maintain Oxygen Saturation 88% or higher      NuStep   Level 1    SPM 80    Minutes 30    METs 1.5      Prescription Details   Frequency (times per week) 2    Duration Progress to 30 minutes of continuous aerobic without signs/symptoms of physical distress      Intensity   THRR 40-80% of Max Heartrate 90-118    Ratings of Perceived Exertion 11-13    Perceived Dyspnea 0-4      Progression   Progression Continue to progress workloads to maintain intensity without signs/symptoms of physical distress.      Resistance Training   Training Prescription Yes    Weight 2 lb    Reps 10-15             Exercise Goals: Frequency: Be able to perform aerobic exercise two to three times per week in program working toward 2-5 days per week of home exercise.  Intensity: Work with a perceived exertion of 11 (fairly light) - 15 (hard) while following your exercise prescription.  We will make changes to your prescription with you as you progress through the program.   Duration: Be able to do 30 to 45 minutes of continuous aerobic exercise in addition to a 5 minute warm-up and a 5 minute cool-down routine.   Nutrition Goals: Your personal nutrition goals will be established when you do your nutrition analysis with the dietician.  The following are general nutrition  guidelines to follow: Cholesterol < 200mg /day Sodium < 1500mg /day Fiber: Women over 50 yrs - 21 grams per day  Personal Goals:  Personal Goals and Risk Factors at Admission - 12/17/22 1459       Core Components/Risk Factors/Patient Goals on Admission    Weight Management Yes;Obesity;Weight Loss    Intervention Weight Management: Develop a combined nutrition and exercise program designed to reach desired caloric intake, while maintaining appropriate intake of nutrient and fiber, sodium and fats, and appropriate energy expenditure required for the weight goal.;Weight Management: Provide education and appropriate resources to help participant work on and attain dietary goals.;Weight Management/Obesity: Establish reasonable short term and long term weight goals.;Obesity: Provide education and appropriate resources to help participant work on and attain dietary goals.    Admit Weight 156 lb 8 oz (71 kg)    Goal Weight: Short Term 150 lb (68 kg)    Goal Weight: Long Term 150 lb (68 kg)    Expected Outcomes Short Term: Continue to assess and modify interventions until short term weight is achieved;Long Term: Adherence to nutrition and physical activity/exercise program aimed toward attainment of established weight goal;Weight Loss: Understanding of general recommendations for a balanced deficit meal plan, which promotes 1-2 lb weight loss per week and includes a negative  energy balance of [id number] kcal/d;Understanding recommendations for meals to include 15-35% energy as protein, 25-35% energy from fat, 35-60% energy from carbohydrates, less than 200mg  of dietary cholesterol, 20-35 gm of total fiber daily;Understanding of distribution of calorie intake throughout the day with the consumption of 4-5 meals/snacks    Improve shortness of breath with ADL's Yes    Intervention Provide education, individualized exercise plan and daily activity instruction to help decrease symptoms of SOB with activities of daily  living.    Expected Outcomes Short Term: Improve cardiorespiratory fitness to achieve a reduction of symptoms when performing ADLs;Long Term: Be able to perform more ADLs without symptoms or delay the onset of symptoms    Increase knowledge of respiratory medications and ability to use respiratory devices properly  Yes    Intervention Provide education and demonstration as needed of appropriate use of medications, inhalers, and oxygen therapy.    Expected Outcomes Short Term: Achieves understanding of medications use. Understands that oxygen is a medication prescribed by physician. Demonstrates appropriate use of inhaler and oxygen therapy.;Long Term: Maintain appropriate use of medications, inhalers, and oxygen therapy.    Heart Failure Yes    Intervention Provide a combined exercise and nutrition program that is supplemented with education, support and counseling about heart failure. Directed toward relieving symptoms such as shortness of breath, decreased exercise tolerance, and extremity edema.    Expected Outcomes Improve functional capacity of life;Short term: Attendance in program 2-3 days a week with increased exercise capacity. Reported lower sodium intake. Reported increased fruit and vegetable intake. Reports medication compliance.;Short term: Daily weights obtained and reported for increase. Utilizing diuretic protocols set by physician.;Long term: Adoption of self-care skills and reduction of barriers for early signs and symptoms recognition and intervention leading to self-care maintenance.    Hypertension Yes    Intervention Monitor prescription use compliance.;Provide education on lifestyle modifcations including regular physical activity/exercise, weight management, moderate sodium restriction and increased consumption of fresh fruit, vegetables, and low fat dairy, alcohol moderation, and smoking cessation.    Expected Outcomes Short Term: Continued assessment and intervention until BP is  < 140/68mm HG in hypertensive participants. < 130/56mm HG in hypertensive participants with diabetes, heart failure or chronic kidney disease.;Long Term: Maintenance of blood pressure at goal levels.    Lipids Yes    Intervention Provide education and support for participant on nutrition & aerobic/resistive exercise along with prescribed medications to achieve LDL 70mg , HDL >40mg .    Expected Outcomes Short Term: Participant states understanding of desired cholesterol values and is compliant with medications prescribed. Participant is following exercise prescription and nutrition guidelines.;Long Term: Cholesterol controlled with medications as prescribed, with individualized exercise RX and with personalized nutrition plan. Value goals: LDL < 70mg , HDL > 40 mg.             Tobacco Use Initial Evaluation: Social History   Tobacco Use  Smoking Status Never  Smokeless Tobacco Never    Exercise Goals and Review:  Exercise Goals     Row Name 12/17/22 1526             Exercise Goals   Increase Physical Activity Yes       Intervention Provide advice, education, support and counseling about physical activity/exercise needs.;Develop an individualized exercise prescription for aerobic and resistive training based on initial evaluation findings, risk stratification, comorbidities and participant's personal goals.       Expected Outcomes Short Term: Attend rehab on a regular basis to increase amount of physical  activity.;Long Term: Exercising regularly at least 3-5 days a week.;Long Term: Add in home exercise to make exercise part of routine and to increase amount of physical activity.       Increase Strength and Stamina Yes       Intervention Provide advice, education, support and counseling about physical activity/exercise needs.;Develop an individualized exercise prescription for aerobic and resistive training based on initial evaluation findings, risk stratification, comorbidities and  participant's personal goals.       Expected Outcomes Short Term: Increase workloads from initial exercise prescription for resistance, speed, and METs.;Short Term: Perform resistance training exercises routinely during rehab and add in resistance training at home;Long Term: Improve cardiorespiratory fitness, muscular endurance and strength as measured by increased METs and functional capacity ( )       Able to understand and use rate of perceived exertion (RPE) scale Yes       Intervention Provide education and explanation on how to use RPE scale       Expected Outcomes Short Term: Able to use RPE daily in rehab to express subjective intensity level;Long Term:  Able to use RPE to guide intensity level when exercising independently       Able to understand and use Dyspnea scale Yes       Intervention Provide education and explanation on how to use Dyspnea scale       Expected Outcomes Short Term: Able to use Dyspnea scale daily in rehab to express subjective sense of shortness of breath during exertion;Long Term: Able to use Dyspnea scale to guide intensity level when exercising independently       Knowledge and understanding of Target Heart Rate Range (THRR) Yes       Intervention Provide education and explanation of THRR including how the numbers were predicted and where they are located for reference       Expected Outcomes Short Term: Able to state/look up THRR;Short Term: Able to use daily as guideline for intensity in rehab;Long Term: Able to use THRR to govern intensity when exercising independently       Able to check pulse independently Yes       Intervention Provide education and demonstration on how to check pulse in carotid and radial arteries.;Review the importance of being able to check your own pulse for safety during independent exercise       Expected Outcomes Short Term: Able to explain why pulse checking is important during independent exercise;Long Term: Able to check pulse  independently and accurately       Understanding of Exercise Prescription Yes       Intervention Provide education, explanation, and written materials on patient's individual exercise prescription       Expected Outcomes Short Term: Able to explain program exercise prescription;Long Term: Able to explain home exercise prescription to exercise independently                Copy of goals given to participant.

## 2022-12-17 NOTE — Progress Notes (Signed)
Pulmonary Individual Treatment Plan  Patient Details  Name: [name] [name] MRN: [id number] Date of Birth: Dec 08, 1931 Referring Provider:   Flowsheet Row PULMONARY REHAB OTHER RESP ORIENTATION from 12/17/2022 in [facility] CARDIAC REHABILITATION  Referring Provider Wallie Char MD       Initial Encounter Date:  Flowsheet Row PULMONARY REHAB OTHER RESP ORIENTATION from 12/17/2022 in [place] CARDIAC REHABILITATION  Date 12/17/22       Visit Diagnosis: Pulmonary hypertension (HCC)  Patient's Home Medications on Admission:   Current Outpatient Medications:    acetaminophen (TYLENOL) 325 MG tablet, Take 2 tablets (650 mg total) by mouth every 6 (six) hours as needed for mild pain (or Fever >/= 101)., Disp: 100 tablet, Rfl: 2   albuterol (VENTOLIN HFA) 108 (90 Base) MCG/ACT inhaler, Inhale 2 puffs into the lungs every 6 (six) hours as needed for wheezing or shortness of breath., Disp: 18 g, Rfl: 2   apixaban (ELIQUIS) 2.5 MG TABS tablet, Take 1 tablet (2.5 mg total) by mouth 2 (two) times daily., Disp: 60 tablet, Rfl: 4   atorvastatin (LIPITOR) 20 MG tablet, Take 1 tablet (20 mg total) by mouth daily., Disp: 90 tablet, Rfl: 3   bisoprolol (ZEBETA) 5 MG tablet, Take 0.5 tablets (2.5 mg total) by mouth daily., Disp: 45 tablet, Rfl: 4   Calcium Carbonate-Vitamin D (CALCIUM 600 + D PO), Take 1 tablet by mouth 2 (two) times daily., Disp: , Rfl:    dapagliflozin propanediol (FARXIGA) 10 MG TABS tablet, Take 1 tablet (10 mg total) by mouth daily before breakfast., Disp: 30 tablet, Rfl: 6   dofetilide (TIKOSYN) 125 MCG capsule, Take 1 capsule (125 mcg total) by mouth 2 (two) times daily., Disp: 180 capsule, Rfl: 2   Fluticasone-Umeclidin-Vilant (TRELEGY ELLIPTA) 100-62.5-25 MCG/ACT AEPB, Inhale 1 puff into the lungs daily., Disp: 60 each, Rfl: 6   folic acid (FOLVITE) 1 MG tablet, Take 1 tablet (1 mg total) by mouth daily., Disp: 30 tablet, Rfl: 3   losartan (COZAAR) 50 MG tablet, Take 1 tablet  (50 mg total) by mouth daily., Disp: 90 tablet, Rfl: 3   Polyethyl Glycol-Propyl Glycol (SYSTANE OP), Place 1 drop into both eyes daily as needed (Dry eyes)., Disp: , Rfl:    polyethylene glycol (MIRALAX / GLYCOLAX) 17 g packet, Take 17 g by mouth daily as needed for mild constipation., Disp: 14 each, Rfl: 0   potassium chloride SA (KLOR-CON M20) 20 MEQ tablet, Take 1 tablet (20 mEq total) by mouth 2 (two) times daily. (Patient taking differently: Take 40 mEq by mouth 2 (two) times daily.), Disp: 60 tablet, Rfl: 6   torsemide (DEMADEX) 20 MG tablet, Take 3 tablets (60 mg total) by mouth in the morning AND 2 tablets (40 mg total) every evening. TAKE 60mg  ONCE DAILY. (Patient taking differently: Take 100 mg by mouth in the morning AND 40 mg total every evening. ), Disp: 150 tablet, Rfl: 6   triamcinolone cream (KENALOG) 0.1 %, Apply 1 application. topically daily as needed (itching)., Disp: , Rfl:   Past Medical History: Past Medical History:  Diagnosis Date   Anxiety    Asthma    Chronic atrial fibrillation (HCC)    Chronic back pain    Chronic diastolic heart failure (HCC)    Chronic obstructive pulmonary disease, unspecified (HCC)    Complete heart block (HCC)    COPD (chronic obstructive pulmonary disease) (HCC)    Coronary atherosclerosis of native coronary artery    a. s/p CABG in 1998  with LIMA-LAD, SVG-Diagonal, SVG-RI-OM b. DES to PLA in 2006   DDD (degenerative disc disease), lumbar    Essential hypertension    Headache    ICD (implantable cardioverter-defibrillator) in place    Mixed hyperlipidemia    Osteoarthritis    Ventricular fibrillation (HCC) 2003   a. seen on PPM interrogation a/w syncope    Tobacco Use: Social History   Tobacco Use  Smoking Status Never  Smokeless Tobacco Never    Labs: Review Flowsheet  More data may exist      Latest Ref Rng & Units 03/08/2007 03/23/2008 08/29/2009 01/24/2013 06/14/2022  Labs for ITP Cardiac and Pulmonary Rehab  Cholestrol  0 - 200 mg/dL 865  784  696  295  -  LDL (calc) 0 - 99 mg/dL 58  56  58  56  -  HDL-C >39 mg/dL 28.4  13.2  36  31  -  Trlycerides <150 mg/dL 440  102  725  366  -  Bicarbonate 20.0 - 28.0 mmol/L - - - - 34.2   O2 Saturation % - - - - 53.0     Details            Capillary Blood Glucose: No results found for: "GLUCAP"   Pulmonary Assessment Scores:  Pulmonary Assessment Scores     Row Name 12/17/22 1456         ADL UCSD   ADL Phase Entry     SOB Score total 75     Rest 0     Walk 3     Stairs 5     Bath 3     Dress 3     Shop 4       CAT Score   CAT Score 26       mMRC Score   mMRC Score 3             UCSD: Self-administered rating of dyspnea associated with activities of daily living (ADLs) 6-point scale (0 = "not at all" to 5 = "maximal or unable to do because of breathlessness")  Scoring Scores range from 0 to 120.  Minimally important difference is 5 units  CAT: CAT can identify the health impairment of COPD patients and is better correlated with disease progression.  CAT has a scoring range of zero to 40. The CAT score is classified into four groups of low (less than 10), medium (10 - 20), high (21-30) and very high (31-40) based on the impact level of disease on health status. A CAT score over 10 suggests significant symptoms.  A worsening CAT score could be explained by an exacerbation, poor medication adherence, poor inhaler technique, or progression of COPD or comorbid conditions.  CAT MCID is 2 points  mMRC: mMRC (Modified Medical Research Council) Dyspnea Scale is used to assess the degree of baseline functional disability in patients of respiratory disease due to dyspnea. No minimal important difference is established. A decrease in score of 1 point or greater is considered a positive change.   Pulmonary Function Assessment:   Exercise Target Goals: Exercise Program Goal: Individual exercise prescription set using results from initial 6 min  walk test and THRR while considering  patient's activity barriers and safety.   Exercise Prescription Goal: Initial exercise prescription builds to 30-45 minutes a day of aerobic activity, 2-3 days per week.  Home exercise guidelines will be given to patient during program as part of exercise prescription that the participant will acknowledge.  Activity  Barriers & Risk Stratification:  Activity Barriers & Cardiac Risk Stratification - 12/17/22 1524       Activity Barriers & Cardiac Risk Stratification   Activity Barriers Deconditioning;Muscular Weakness;Back Problems;Arthritis;Shortness of Breath;Assistive Device;Balance Concerns             6 Minute Walk:  6 Minute Walk     Row Name 12/17/22 1516         6 Minute Walk   Phase Initial     Distance 244 feet     Walk Time 2.83 minutes     # of Rest Breaks 1  2:10     MPH 0.97     RPE 15     Perceived Dyspnea  3     Symptoms Yes (comment)     Comments SOB     Resting HR 63 bpm     Resting BP 92/48     Resting Oxygen Saturation  92 %     Exercise Oxygen Saturation  during 6 min walk 84 %     Max Ex. HR 87 bpm     Max Ex. BP 138/82     2 Minute Post BP 134/70       Interval HR   1 Minute HR 66     2 Minute HR 68     3 Minute HR 67     4 Minute HR 63     5 Minute HR 63     6 Minute HR 87     2 Minute Post HR 64     Interval Heart Rate? Yes       Interval Oxygen   Interval Oxygen? Yes     Baseline Oxygen Saturation % 92 %     1 Minute Oxygen Saturation % 87 %     1 Minute Liters of Oxygen 3 L  pulsed     2 Minute Oxygen Saturation % 84 %     2 Minute Liters of Oxygen 3 L     3 Minute Oxygen Saturation % 91 %     3 Minute Liters of Oxygen 3 L     4 Minute Oxygen Saturation % 92 %     4 Minute Liters of Oxygen 3 L     5 Minute Oxygen Saturation % 93 %     5 Minute Liters of Oxygen 3 L     6 Minute Oxygen Saturation % 92 %     6 Minute Liters of Oxygen 3 L     2 Minute Post Oxygen Saturation % 87 %     2  Minute Post Liters of Oxygen 3 L              Oxygen Initial Assessment:  Oxygen Initial Assessment - 12/17/22 1455       Home Oxygen   Home Oxygen Device Home Concentrator;E-Tanks    Sleep Oxygen Prescription Continuous    Liters per minute 2    Home Resting Oxygen Prescription Continuous    Liters per minute 2    Compliance with Home Oxygen Use Yes      Intervention   Short Term Goals To learn and exhibit compliance with exercise, home and travel O2 prescription;To learn and understand importance of monitoring SPO2 with pulse oximeter and demonstrate accurate use of the pulse oximeter.;To learn and understand importance of maintaining oxygen saturations>88%;To learn and demonstrate proper pursed lip breathing techniques or other breathing techniques. ;To learn and demonstrate proper use of  respiratory medications    Long  Term Goals Exhibits compliance with exercise, home  and travel O2 prescription;Verbalizes importance of monitoring SPO2 with pulse oximeter and return demonstration;Maintenance of O2 saturations>88%;Exhibits proper breathing techniques, such as pursed lip breathing or other method taught during program session;Compliance with respiratory medication;Demonstrates proper use of MDI's             Oxygen Re-Evaluation:   Oxygen Discharge (Final Oxygen Re-Evaluation):   Initial Exercise Prescription:  Initial Exercise Prescription - 12/17/22 1500       Date of Initial Exercise RX and Referring Provider   Date 12/17/22    Referring Provider Wallie Char MD      Oxygen   Oxygen Continuous    Liters 3    Maintain Oxygen Saturation 88% or higher      NuStep   Level 1    SPM 80    Minutes 30    METs 1.5      Prescription Details   Frequency (times per week) 2    Duration Progress to 30 minutes of continuous aerobic without signs/symptoms of physical distress      Intensity   THRR 40-80% of Max Heartrate 90-118    Ratings of Perceived Exertion  11-13    Perceived Dyspnea 0-4      Progression   Progression Continue to progress workloads to maintain intensity without signs/symptoms of physical distress.      Resistance Training   Training Prescription Yes    Weight 2 lb    Reps 10-15             Perform Capillary Blood Glucose checks as needed.  Exercise Prescription Changes:   Exercise Prescription Changes     Row Name 12/17/22 1500             Response to Exercise   Blood Pressure (Admit) 92/48       Blood Pressure (Exercise) 134/82       Blood Pressure (Exit) 134/70       Heart Rate (Admit) 63 bpm       Heart Rate (Exercise) 87 bpm       Heart Rate (Exit) 64 bpm       Oxygen Saturation (Admit) 92 %       Oxygen Saturation (Exercise) 84 %       Oxygen Saturation (Exit) 87 %       Rating of Perceived Exertion (Exercise) 15       Perceived Dyspnea (Exercise) 3       Symptoms SOB, fatigue       Comments walk test results                Exercise Comments:   Exercise Goals and Review:   Exercise Goals     Row Name 12/17/22 1526             Exercise Goals   Increase Physical Activity Yes       Intervention Provide advice, education, support and counseling about physical activity/exercise needs.;Develop an individualized exercise prescription for aerobic and resistive training based on initial evaluation findings, risk stratification, comorbidities and participant's personal goals.       Expected Outcomes Short Term: Attend rehab on a regular basis to increase amount of physical activity.;Long Term: Exercising regularly at least 3-5 days a week.;Long Term: Add in home exercise to make exercise part of routine and to increase amount of physical activity.       Increase Strength and  Stamina Yes       Intervention Provide advice, education, support and counseling about physical activity/exercise needs.;Develop an individualized exercise prescription for aerobic and resistive training based on  initial evaluation findings, risk stratification, comorbidities and participant's personal goals.       Expected Outcomes Short Term: Increase workloads from initial exercise prescription for resistance, speed, and METs.;Short Term: Perform resistance training exercises routinely during rehab and add in resistance training at home;Long Term: Improve cardiorespiratory fitness, muscular endurance and strength as measured by increased METs and functional capacity ( )       Able to understand and use rate of perceived exertion (RPE) scale Yes       Intervention Provide education and explanation on how to use RPE scale       Expected Outcomes Short Term: Able to use RPE daily in rehab to express subjective intensity level;Long Term:  Able to use RPE to guide intensity level when exercising independently       Able to understand and use Dyspnea scale Yes       Intervention Provide education and explanation on how to use Dyspnea scale       Expected Outcomes Short Term: Able to use Dyspnea scale daily in rehab to express subjective sense of shortness of breath during exertion;Long Term: Able to use Dyspnea scale to guide intensity level when exercising independently       Knowledge and understanding of Target Heart Rate Range (THRR) Yes       Intervention Provide education and explanation of THRR including how the numbers were predicted and where they are located for reference       Expected Outcomes Short Term: Able to state/look up THRR;Short Term: Able to use daily as guideline for intensity in rehab;Long Term: Able to use THRR to govern intensity when exercising independently       Able to check pulse independently Yes       Intervention Provide education and demonstration on how to check pulse in carotid and radial arteries.;Review the importance of being able to check your own pulse for safety during independent exercise       Expected Outcomes Short Term: Able to explain why pulse checking is  important during independent exercise;Long Term: Able to check pulse independently and accurately       Understanding of Exercise Prescription Yes       Intervention Provide education, explanation, and written materials on patient's individual exercise prescription       Expected Outcomes Short Term: Able to explain program exercise prescription;Long Term: Able to explain home exercise prescription to exercise independently                Exercise Goals Re-Evaluation :   Discharge Exercise Prescription (Final Exercise Prescription Changes):  Exercise Prescription Changes - 12/17/22 1500       Response to Exercise   Blood Pressure (Admit) 92/48    Blood Pressure (Exercise) 134/82    Blood Pressure (Exit) 134/70    Heart Rate (Admit) 63 bpm    Heart Rate (Exercise) 87 bpm    Heart Rate (Exit) 64 bpm    Oxygen Saturation (Admit) 92 %    Oxygen Saturation (Exercise) 84 %    Oxygen Saturation (Exit) 87 %    Rating of Perceived Exertion (Exercise) 15    Perceived Dyspnea (Exercise) 3    Symptoms SOB, fatigue    Comments walk test results  Nutrition:  Target Goals: Understanding of nutrition guidelines, daily intake of sodium 1500mg , cholesterol 200mg , calories 30% from fat and 7% or less from saturated fats, daily to have 5 or more servings of fruits and vegetables.  Biometrics:  Pre Biometrics - 12/17/22 1526       Pre Biometrics   Height 5' (1.524 m)    Weight 71 kg    Waist Circumference 39 inches    Hip Circumference 42.5 inches    Waist to Hip Ratio 0.92 %    BMI (Calculated) 30.56    Grip Strength 14.6 kg              Nutrition Therapy Plan and Nutrition Goals:   Nutrition Assessments:  MEDIFICTS Score Key: ?70 Need to make dietary changes  40-70 Heart Healthy Diet ? 40 Therapeutic Level Cholesterol Diet  Flowsheet Row PULMONARY REHAB OTHER RESP ORIENTATION from 12/17/2022 in [facility] CARDIAC REHABILITATION  Picture Your Plate  Total Score on Admission 28      Picture Your Plate Scores: <88 Unhealthy dietary pattern with much room for improvement. 41-50 Dietary pattern unlikely to meet recommendations for good health and room for improvement. 51-60 More healthful dietary pattern, with some room for improvement.  >60 Healthy dietary pattern, although there may be some specific behaviors that could be improved.    Nutrition Goals Re-Evaluation:   Nutrition Goals Discharge (Final Nutrition Goals Re-Evaluation):   Psychosocial: Target Goals: Acknowledge presence or absence of significant depression and/or stress, maximize coping skills, provide positive support system. Participant is able to verbalize types and ability to use techniques and skills needed for reducing stress and depression.  Initial Review & Psychosocial Screening:  Initial Psych Review & Screening - 12/17/22 1500       Initial Review   Current issues with Current Depression;Current Stress Concerns;Current Sleep Concerns    Source of Stress Concerns Unable to participate in former interests or hobbies;Chronic Illness      Family Dynamics   Good Support System? Yes   Patient's son and daughter are her support people.     Barriers   Psychosocial barriers to participate in program The patient should benefit from training in stress management and relaxation.      Screening Interventions   Interventions Encouraged to exercise;To provide support and resources with identified psychosocial needs;Provide feedback about the scores to participant    Expected Outcomes Short Term goal: Utilizing psychosocial counselor, staff and physician to assist with identification of specific Stressors or current issues interfering with healing process. Setting desired goal for each stressor or current issue identified.;Long Term Goal: Stressors or current issues are controlled or eliminated.;Short Term goal: Identification and review with participant of any Quality  of Life or Depression concerns found by scoring the questionnaire.;Long Term goal: The participant improves quality of Life and PHQ9 Scores as seen by post scores and/or verbalization of changes             Quality of Life Scores:  Scores of 19 and below usually indicate a poorer quality of life in these areas.  A difference of  2-3 points is a clinically meaningful difference.  A difference of 2-3 points in the total score of the Quality of Life Index has been associated with significant improvement in overall quality of life, self-image, physical symptoms, and general health in studies assessing change in quality of life.   PHQ-9: Review Flowsheet       12/17/2022  Depression screen PHQ 2/9  Decreased  Interest 3  Down, Depressed, Hopeless 1  PHQ - 2 Score 4  Altered sleeping 3  Tired, decreased energy 3  Change in appetite 2  Feeling bad or failure about yourself  1  Trouble concentrating 3  Moving slowly or fidgety/restless 0  Suicidal thoughts 0  PHQ-9 Score 16  Difficult doing work/chores Somewhat difficult    Details           Interpretation of Total Score  Total Score Depression Severity:  1-4 = Minimal depression, 5-9 = Mild depression, 10-14 = Moderate depression, 15-19 = Moderately severe depression, 20-27 = Severe depression   Psychosocial Evaluation and Intervention:  Psychosocial Evaluation - 12/17/22 1502       Psychosocial Evaluation & Interventions   Interventions Stress management education;Relaxation education;Encouraged to exercise with the program and follow exercise prescription    Comments Patient was referred to PR with pulmonary HTN. She was accompained today by her son and daughter. Her PHQ-9 score was 16 due to sleeping issues caused by nocturia, lack of interest in doing things, poor appepite and lack of energy. She was hospitalized in April with HF exacerbation and was discharged to SNF and then went to an assistive living facility in June.  She says she has been down and depressed since moving into [place]. She wants to go back to her home and get back to living independently. She has never been treated for depression or anxiety and says she is not willing to be treated at this time. 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 She is hard of hearting but very alert and orientated. She was discharged from the hospital on O2. She says she has tried outpatient PT and was not able to complete the sessions due to SOB and fatigue so she is very skeptical that she will be able to participate in PR but is willing to try since her son and daughter want her to try. They both say she was not using O2 when she attempted PT and they feel she will be able to participate. They will provide transportation or transportation will be provided by [facility] Her son and daughter say her diuretic was increased  at her last visit to the HF clinic and since this she has been staying in her room not going to activities or to the dining room to eat like she was doing previously. They think this is mainly due to fear of her having to urinate and soiling herself. The patient says she has to go to the bathroom every 15 minutes and this is why she does not leave her room. She does wear a pull-up in case she can't make it to the bathroom. She says her goals for the program are to breathe better and feel better overall. Her lack of motivation could be a barrier for her to participate in the program.    Expected Outcomes Short term: Attend sessions. Long Term: Breathe better and feel better overall.    Continue Psychosocial Services  Follow up required by staff             Psychosocial Re-Evaluation:   Psychosocial Discharge (Final Psychosocial Re-Evaluation):    Education: Education Goals: Education classes will be provided on a weekly basis, covering required topics. Participant will state understanding/return demonstration of topics presented.  Learning  Barriers/Preferences:  Learning Barriers/Preferences - 12/17/22 1458       Learning Barriers/Preferences   Learning Barriers Hearing    Learning Preferences Written Material;Audio;Skilled Demonstration  Education Topics: How Lungs Work and Diseases: - Discuss the anatomy of the lungs and diseases that can affect the lungs, such as COPD.   Exercise: -Discuss the importance of exercise, FITT principles of exercise, normal and abnormal responses to exercise, and how to exercise safely.   Environmental Irritants: -Discuss types of environmental irritants and how to limit exposure to environmental irritants.   Meds/Inhalers and oxygen: - Discuss respiratory medications, definition of an inhaler and oxygen, and the proper way to use an inhaler and oxygen.   Energy Saving Techniques: - Discuss methods to conserve energy and decrease shortness of breath when performing activities of daily living.    Bronchial Hygiene / Breathing Techniques: - Discuss breathing mechanics, pursed-lip breathing technique,  proper posture, effective ways to clear airways, and other functional breathing techniques   Cleaning Equipment: - Provides group verbal and written instruction about the health risks of elevated stress, cause of high stress, and healthy ways to reduce stress.   Nutrition I: Fats: - Discuss the types of cholesterol, what cholesterol does to the body, and how cholesterol levels can be controlled.   Nutrition II: Labels: -Discuss the different components of food labels and how to read food labels.   Respiratory Infections: - Discuss the signs and symptoms of respiratory infections, ways to prevent respiratory infections, and the importance of seeking medical treatment when having a respiratory infection.   Stress I: Signs and Symptoms: - Discuss the causes of stress, how stress may lead to anxiety and depression, and ways to limit stress.   Stress II:  Relaxation: -Discuss relaxation techniques to limit stress.   Oxygen for Home/Travel: - Discuss how to prepare for travel when on oxygen and proper ways to transport and store oxygen to ensure safety.   Knowledge Questionnaire Score:  Knowledge Questionnaire Score - 12/17/22 1458       Knowledge Questionnaire Score   Pre Score 16/18             Core Components/Risk Factors/Patient Goals at Admission:  Personal Goals and Risk Factors at Admission - 12/17/22 1459       Core Components/Risk Factors/Patient Goals on Admission    Weight Management Yes;Obesity;Weight Loss    Intervention Weight Management: Develop a combined nutrition and exercise program designed to reach desired caloric intake, while maintaining appropriate intake of nutrient and fiber, sodium and fats, and appropriate energy expenditure required for the weight goal.;Weight Management: Provide education and appropriate resources to help participant work on and attain dietary goals.;Weight Management/Obesity: Establish reasonable short term and long term weight goals.;Obesity: Provide education and appropriate resources to help participant work on and attain dietary goals.    Admit Weight 156 lb 8 oz (71 kg)    Goal Weight: Short Term 150 lb (68 kg)    Goal Weight: Long Term 150 lb (68 kg)    Expected Outcomes Short Term: Continue to assess and modify interventions until short term weight is achieved;Long Term: Adherence to nutrition and physical activity/exercise program aimed toward attainment of established weight goal;Weight Loss: Understanding of general recommendations for a balanced deficit meal plan, which promotes 1-2 lb weight loss per week and includes a negative energy balance of [phone] kcal/d;Understanding recommendations for meals to include 15-35% energy as protein, 25-35% energy from fat, 35-60% energy from carbohydrates, less than 200mg  of dietary cholesterol, 20-35 gm of total fiber daily;Understanding  of distribution of calorie intake throughout the day with the consumption of 4-5 meals/snacks    Improve shortness of  breath with ADL's Yes    Intervention Provide education, individualized exercise plan and daily activity instruction to help decrease symptoms of SOB with activities of daily living.    Expected Outcomes Short Term: Improve cardiorespiratory fitness to achieve a reduction of symptoms when performing ADLs;Long Term: Be able to perform more ADLs without symptoms or delay the onset of symptoms    Increase knowledge of respiratory medications and ability to use respiratory devices properly  Yes    Intervention Provide education and demonstration as needed of appropriate use of medications, inhalers, and oxygen therapy.    Expected Outcomes Short Term: Achieves understanding of medications use. Understands that oxygen is a medication prescribed by physician. Demonstrates appropriate use of inhaler and oxygen therapy.;Long Term: Maintain appropriate use of medications, inhalers, and oxygen therapy.    Heart Failure Yes    Intervention Provide a combined exercise and nutrition program that is supplemented with education, support and counseling about heart failure. Directed toward relieving symptoms such as shortness of breath, decreased exercise tolerance, and extremity edema.    Expected Outcomes Improve functional capacity of life;Short term: Attendance in program 2-3 days a week with increased exercise capacity. Reported lower sodium intake. Reported increased fruit and vegetable intake. Reports medication compliance.;Short term: Daily weights obtained and reported for increase. Utilizing diuretic protocols set by physician.;Long term: Adoption of self-care skills and reduction of barriers for early signs and symptoms recognition and intervention leading to self-care maintenance.    Hypertension Yes    Intervention Monitor prescription use compliance.;Provide education on lifestyle  modifcations including regular physical activity/exercise, weight management, moderate sodium restriction and increased consumption of fresh fruit, vegetables, and low fat dairy, alcohol moderation, and smoking cessation.    Expected Outcomes Short Term: Continued assessment and intervention until BP is < 140/34mm HG in hypertensive participants. < 130/75mm HG in hypertensive participants with diabetes, heart failure or chronic kidney disease.;Long Term: Maintenance of blood pressure at goal levels.    Lipids Yes    Intervention Provide education and support for participant on nutrition & aerobic/resistive exercise along with prescribed medications to achieve LDL 70mg , HDL >40mg .    Expected Outcomes Short Term: Participant states understanding of desired cholesterol values and is compliant with medications prescribed. Participant is following exercise prescription and nutrition guidelines.;Long Term: Cholesterol controlled with medications as prescribed, with individualized exercise RX and with personalized nutrition plan. Value goals: LDL < 70mg , HDL > 40 mg.             Core Components/Risk Factors/Patient Goals Review:    Core Components/Risk Factors/Patient Goals at Discharge (Final Review):    ITP Comments:   Comments: Patient arrived for 1st visit/orientation/education at 1300. Patient was referred to CR by Wert due to Pulmonary HTN. During orientation advised patient on arrival and appointment times what to wear, what to do before, during and after exercise. Reviewed attendance and class policy.  Pt is scheduled to return Cardiac Rehab on 12/24/22 at 1330. Pt was advised to come to class 15 minutes before class starts.  Discussed RPE/Dpysnea scales. Patient participated in warm up stretches. Patient was able to complete 6 minute walk test. Patient was measured for the equipment. Discussed equipment safety with patient. Took patient pre-anthropometric measurements. Patient finished visit  at 1430.

## 2022-12-22 ENCOUNTER — Telehealth: Payer: Self-pay

## 2022-12-22 ENCOUNTER — Other Ambulatory Visit: Payer: Self-pay | Admitting: Pharmacy Technician

## 2022-12-22 DIAGNOSIS — D631 Anemia in chronic kidney disease: Secondary | ICD-10-CM | POA: Insufficient documentation

## 2022-12-22 NOTE — Telephone Encounter (Signed)
Auth Submission: NO AUTH NEEDED Site of care: Site of care: [facility] Payer: [employer] MEDICARE  Medication & CPT/J Code(s) submitted: Venofer (Iron Sucrose) J1756 Route of submission (phone, fax, portal): portal Phone # Fax # Auth type: Buy/Bill PB Units/visits requested: 300mg  x2doses, weekly Reference number: [id number] Approval from: 12/22/22 to 04/20/23

## 2022-12-24 ENCOUNTER — Encounter (HOSPITAL_COMMUNITY)
Admission: RE | Admit: 2022-12-24 | Discharge: 2022-12-24 | Disposition: A | Discharge status: Home / Self Care | Payer: Medicare PPO | Source: Ambulatory Visit | Attending: Internal Medicine | Admitting: Internal Medicine

## 2022-12-24 ENCOUNTER — Other Ambulatory Visit (HOSPITAL_COMMUNITY): Payer: Self-pay | Admitting: Internal Medicine

## 2022-12-24 DIAGNOSIS — D631 Anemia in chronic kidney disease: Secondary | ICD-10-CM | POA: Diagnosis not present

## 2022-12-24 DIAGNOSIS — N1832 Chronic kidney disease, stage 3b: Secondary | ICD-10-CM | POA: Insufficient documentation

## 2022-12-24 DIAGNOSIS — I5032 Chronic diastolic (congestive) heart failure: Secondary | ICD-10-CM | POA: Insufficient documentation

## 2022-12-24 DIAGNOSIS — I272 Pulmonary hypertension, unspecified: Secondary | ICD-10-CM | POA: Insufficient documentation

## 2022-12-24 NOTE — Progress Notes (Signed)
Daily Session Note  Patient Details  Name: [name] [name] MRN: [id number] Date of Birth: 1931-06-25 Referring Provider:   Flowsheet Row PULMONARY REHAB OTHER RESP ORIENTATION from 12/17/2022 in [facility] CARD[facility]  Referring Provider Wallie Char MD       Encounter Date: 12/24/2022  Check In:  Session Check In - 12/24/22 1348       Check-In   Supervising physician immediately available to respond to emergencies See telemetry face sheet for immediately available MD    Location [facility]    Staff Present Fabio Pierce, MA, RCEP, CCRP, Harolyn Rutherford, RN, BSN;Hillary Troutman BSN, RN    Virtual Visit No    Medication changes reported     No    Fall or balance concerns reported    No    Warm-up and Cool-down Performed on first and last piece of Public house manager Performed Yes    VAD Patient? No    PAD/SET Patient? No      Pain Assessment   Currently in Pain? No/denies             Capillary Blood Glucose: No results found for this or any previous visit (from the past 24 hour(s)).    Social History   Tobacco Use  Smoking Status Never  Smokeless Tobacco Never    Goals Met:  Proper associated with RPD/PD & O2 Sat Exercise tolerated well Personal goals reviewed Queuing for purse lip breathing No report of concerns or symptoms today Strength training completed today  Goals Unmet:  Not Applicable  Comments: First full day of exercise!  Patient was oriented to gym and equipment including functions, settings, policies, and procedures.  Patient's individual exercise prescription and treatment plan were reviewed.  All starting workloads were established based on the results of the 6 minute walk test done at initial orientation visit.  The plan for exercise progression was also introduced and progression will be customized based on patient's performance and goals.    Dr. Erick Blinks is Medical Director for [facility] Pulmonary Rehab.

## 2022-12-25 ENCOUNTER — Encounter (HOSPITAL_COMMUNITY): Payer: Medicare PPO

## 2022-12-25 ENCOUNTER — Encounter (INDEPENDENT_AMBULATORY_CARE_PROVIDER_SITE_OTHER): Payer: Medicare PPO | Admitting: Emergency Medicine

## 2022-12-25 VITALS — BP 122/55 | HR 65 | Temp 98.1°F | Resp 18

## 2022-12-25 DIAGNOSIS — I5032 Chronic diastolic (congestive) heart failure: Secondary | ICD-10-CM

## 2022-12-25 DIAGNOSIS — D631 Anemia in chronic kidney disease: Secondary | ICD-10-CM | POA: Diagnosis not present

## 2022-12-25 DIAGNOSIS — N1832 Chronic kidney disease, stage 3b: Secondary | ICD-10-CM

## 2022-12-25 LAB — BASIC METABOLIC PANEL
BUN/Creatinine Ratio: 20 (ref 12–28)
BUN: 42 mg/dL — ABNORMAL HIGH (ref 8–27)
CO2: 26 mmol/L (ref 20–29)
Calcium: 9.7 mg/dL (ref 8.7–10.3)
Chloride: 99 mmol/L (ref 96–106)
Creatinine, Ser: 2.1 mg/dL — ABNORMAL HIGH (ref 0.57–1.00)
Glucose: 87 mg/dL (ref 70–99)
Potassium: 5.1 mmol/L (ref 3.5–5.2)
Sodium: 143 mmol/L (ref 134–144)
eGFR: 22 mL/min/{1.73_m2} — ABNORMAL LOW (ref >59)

## 2022-12-25 LAB — SPECIMEN STATUS REPORT

## 2022-12-25 MED ORDER — DIPHENHYDRAMINE HCL 25 MG PO CAPS
25.0000 mg | ORAL_CAPSULE | Freq: Once | ORAL | Status: AC
  Administered 2022-12-25: 25 mg via ORAL
  Filled 2022-12-25: qty 1

## 2022-12-25 MED ORDER — SODIUM CHLORIDE 0.9 % IV SOLN
300.0000 mg | Freq: Once | INTRAVENOUS | Status: AC
  Administered 2022-12-25: 300 mg via INTRAVENOUS
  Filled 2022-12-25: qty 15

## 2022-12-25 MED ORDER — ACETAMINOPHEN 325 MG PO TABS
650.0000 mg | ORAL_TABLET | Freq: Once | ORAL | Status: AC
  Administered 2022-12-25: 650 mg via ORAL
  Filled 2022-12-25: qty 2

## 2022-12-25 NOTE — Progress Notes (Signed)
Diagnosis: Iron Deficiency Anemia  Provider:   Prince Rome NP  Procedure: IV Infusion  IV Type: Peripheral, IV Location: L Antecubital  Venofer (Iron Sucrose), Dose: 300 mg  Infusion Start Time: 1337  Infusion Stop Time: 1534  Post Infusion IV Care: Observation period completed and Peripheral IV Discontinued  Discharge: Condition: Good, Destination: Home . AVS Provided  Performed by:  Arrie Senate, RN

## 2022-12-28 ENCOUNTER — Other Ambulatory Visit (HOSPITAL_COMMUNITY): Payer: Self-pay | Admitting: Internal Medicine

## 2022-12-29 ENCOUNTER — Encounter (HOSPITAL_COMMUNITY)
Admission: RE | Admit: 2022-12-29 | Discharge: 2022-12-29 | Disposition: A | Discharge status: Home / Self Care | Payer: Medicare PPO | Source: Ambulatory Visit | Attending: Internal Medicine | Admitting: Internal Medicine

## 2022-12-29 DIAGNOSIS — I5032 Chronic diastolic (congestive) heart failure: Secondary | ICD-10-CM | POA: Diagnosis not present

## 2022-12-29 DIAGNOSIS — I272 Pulmonary hypertension, unspecified: Secondary | ICD-10-CM

## 2022-12-29 DIAGNOSIS — N1832 Chronic kidney disease, stage 3b: Secondary | ICD-10-CM | POA: Diagnosis not present

## 2022-12-29 DIAGNOSIS — D631 Anemia in chronic kidney disease: Secondary | ICD-10-CM | POA: Diagnosis not present

## 2022-12-29 NOTE — Progress Notes (Signed)
Daily Session Note  Patient Details  Name: [name] [name] MRN: [id number] Date of Birth: 19-Oct-1931 Referring Provider:   Flowsheet Row PULMONARY REHAB OTHER RESP ORIENTATION from 12/17/2022 in [facility] RE[facility]  Referring Provider Wallie Char MD       Encounter Date: 12/29/2022  Check In:  Session Check In - 12/29/22 1330       Check-In   Supervising physician immediately available to respond to emergencies See telemetry face sheet for immediately available MD    Location [facility]    Staff Present Ross Ludwig, BS, Exercise Physiologist;Phyllis Billingsley, RN;Imanni Burdine Daphine Deutscher, RN, BSN    Virtual Visit No    Medication changes reported     No    Fall or balance concerns reported    Yes    Comments walks with a rollator and the pt is taking Demadex-she has slipped one time in the past year    Tobacco Cessation No Change    Warm-up and Cool-down Performed on first and last piece of equipment    Resistance Training Performed Yes    VAD Patient? No      Pain Assessment   Currently in Pain? No/denies             Capillary Blood Glucose: No results found for this or any previous visit (from the past 24 hour(s)).    Social History   Tobacco Use  Smoking Status Never  Smokeless Tobacco Never    Goals Met:  Proper associated with RPD/PD & O2 Sat Independence with exercise equipment Using PLB without cueing & demonstrates good technique Exercise tolerated well Queuing for purse lip breathing No report of concerns or symptoms today Strength training completed today  Goals Unmet:  Not Applicable  Comments: Pt able to follow exercise prescription today without complaint.  Will continue to monitor for progression.    Dr. Erick Blinks is Medical Director for [facility] Pulmonary Rehab.

## 2022-12-30 ENCOUNTER — Encounter (HOSPITAL_COMMUNITY): Payer: Self-pay | Admitting: *Deleted

## 2022-12-30 DIAGNOSIS — I272 Pulmonary hypertension, unspecified: Secondary | ICD-10-CM

## 2022-12-30 NOTE — Progress Notes (Signed)
Pulmonary Individual Treatment Plan  Patient Details  Name: [name] [name] MRN: [id number] Date of Birth: 1931/08/10 Referring Provider:   Flowsheet Row PULMONARY REHAB OTHER RESP ORIENTATION from 12/17/2022 in [facility] CARDIAC REHABILITATION  Referring Provider Wallie Char MD       Initial Encounter Date:  Flowsheet Row PULMONARY REHAB OTHER RESP ORIENTATION from 12/17/2022 in [place] CARDIAC REHABILITATION  Date 12/17/22       Visit Diagnosis: Pulmonary hypertension (HCC)  Patient's Home Medications on Admission:   Current Outpatient Medications:    acetaminophen (TYLENOL) 325 MG tablet, Take 2 tablets (650 mg total) by mouth every 6 (six) hours as needed for mild pain (or Fever >/= 101)., Disp: 100 tablet, Rfl: 2   albuterol (VENTOLIN HFA) 108 (90 Base) MCG/ACT inhaler, Inhale 2 puffs into the lungs every 6 (six) hours as needed for wheezing or shortness of breath., Disp: 18 g, Rfl: 2   apixaban (ELIQUIS) 2.5 MG TABS tablet, Take 1 tablet (2.5 mg total) by mouth 2 (two) times daily., Disp: 60 tablet, Rfl: 4   atorvastatin (LIPITOR) 20 MG tablet, Take 1 tablet (20 mg total) by mouth daily., Disp: 90 tablet, Rfl: 3   bisoprolol (ZEBETA) 5 MG tablet, Take 0.5 tablets (2.5 mg total) by mouth daily., Disp: 45 tablet, Rfl: 4   Calcium Carbonate-Vitamin D (CALCIUM 600 + D PO), Take 1 tablet by mouth 2 (two) times daily., Disp: , Rfl:    dapagliflozin propanediol (FARXIGA) 10 MG TABS tablet, Take 1 tablet (10 mg total) by mouth daily before breakfast., Disp: 30 tablet, Rfl: 6   dofetilide (TIKOSYN) 125 MCG capsule, Take 1 capsule (125 mcg total) by mouth 2 (two) times daily., Disp: 180 capsule, Rfl: 2   Fluticasone-Umeclidin-Vilant (TRELEGY ELLIPTA) 100-62.5-25 MCG/ACT AEPB, Inhale 1 puff into the lungs daily., Disp: 60 each, Rfl: 6   folic acid (FOLVITE) 1 MG tablet, Take 1 tablet (1 mg total) by mouth daily., Disp: 30 tablet, Rfl: 3   losartan (COZAAR) 50 MG tablet, Take 1 tablet  (50 mg total) by mouth daily., Disp: 90 tablet, Rfl: 3   Polyethyl Glycol-Propyl Glycol (SYSTANE OP), Place 1 drop into both eyes daily as needed (Dry eyes)., Disp: , Rfl:    polyethylene glycol (MIRALAX / GLYCOLAX) 17 g packet, Take 17 g by mouth daily as needed for mild constipation., Disp: 14 each, Rfl: 0   potassium chloride SA (KLOR-CON M20) 20 MEQ tablet, Take 1 tablet (20 mEq total) by mouth 2 (two) times daily. (Patient taking differently: Take 40 mEq by mouth 2 (two) times daily.), Disp: 60 tablet, Rfl: 6   torsemide (DEMADEX) 20 MG tablet, Take 3 tablets (60 mg total) by mouth in the morning AND 2 tablets (40 mg total) every evening. TAKE 60mg  ONCE DAILY. (Patient taking differently: Take 100 mg by mouth in the morning AND 40 mg total every evening. ), Disp: 150 tablet, Rfl: 6   triamcinolone cream (KENALOG) 0.1 %, Apply 1 application. topically daily as needed (itching)., Disp: , Rfl:   Past Medical History: Past Medical History:  Diagnosis Date   Anxiety    Asthma    Chronic atrial fibrillation (HCC)    Chronic back pain    Chronic diastolic heart failure (HCC)    Chronic obstructive pulmonary disease, unspecified (HCC)    Complete heart block (HCC)    COPD (chronic obstructive pulmonary disease) (HCC)    Coronary atherosclerosis of native coronary artery    a. s/p CABG in 1998  with LIMA-LAD, SVG-Diagonal, SVG-RI-OM b. DES to PLA in 2006   DDD (degenerative disc disease), lumbar    Essential hypertension    Headache    ICD (implantable cardioverter-defibrillator) in place    Mixed hyperlipidemia    Osteoarthritis    Ventricular fibrillation (HCC) 2003   a. seen on PPM interrogation a/w syncope    Tobacco Use: Social History   Tobacco Use  Smoking Status Never  Smokeless Tobacco Never    Labs: Review Flowsheet  More data may exist      Latest Ref Rng & Units 03/08/2007 03/23/2008 08/29/2009 01/24/2013 06/14/2022  Labs for ITP Cardiac and Pulmonary Rehab  Cholestrol  0 - 200 mg/dL 161  096  045  409  -  LDL (calc) 0 - 99 mg/dL 58  56  58  56  -  HDL-C >39 mg/dL 81.1  91.4  36  31  -  Trlycerides <150 mg/dL 782  956  213  086  -  Bicarbonate 20.0 - 28.0 mmol/L - - - - 34.2   O2 Saturation % - - - - 53.0     Details            Capillary Blood Glucose: No results found for: "GLUCAP"   Pulmonary Assessment Scores:  Pulmonary Assessment Scores     Row Name 12/17/22 1456         ADL UCSD   ADL Phase Entry     SOB Score total 75     Rest 0     Walk 3     Stairs 5     Bath 3     Dress 3     Shop 4       CAT Score   CAT Score 26       mMRC Score   mMRC Score 3             UCSD: Self-administered rating of dyspnea associated with activities of daily living (ADLs) 6-point scale (0 = "not at all" to 5 = "maximal or unable to do because of breathlessness")  Scoring Scores range from 0 to 120.  Minimally important difference is 5 units  CAT: CAT can identify the health impairment of COPD patients and is better correlated with disease progression.  CAT has a scoring range of zero to 40. The CAT score is classified into four groups of low (less than 10), medium (10 - 20), high (21-30) and very high (31-40) based on the impact level of disease on health status. A CAT score over 10 suggests significant symptoms.  A worsening CAT score could be explained by an exacerbation, poor medication adherence, poor inhaler technique, or progression of COPD or comorbid conditions.  CAT MCID is 2 points  mMRC: mMRC (Modified Medical Research Council) Dyspnea Scale is used to assess the degree of baseline functional disability in patients of respiratory disease due to dyspnea. No minimal important difference is established. A decrease in score of 1 point or greater is considered a positive change.   Pulmonary Function Assessment:   Exercise Target Goals: Exercise Program Goal: Individual exercise prescription set using results from initial 6 min  walk test and THRR while considering  patient's activity barriers and safety.   Exercise Prescription Goal: Initial exercise prescription builds to 30-45 minutes a day of aerobic activity, 2-3 days per week.  Home exercise guidelines will be given to patient during program as part of exercise prescription that the participant will acknowledge.  Activity  Barriers & Risk Stratification:  Activity Barriers & Cardiac Risk Stratification - 12/17/22 1524       Activity Barriers & Cardiac Risk Stratification   Activity Barriers Deconditioning;Muscular Weakness;Back Problems;Arthritis;Shortness of Breath;Assistive Device;Balance Concerns             6 Minute Walk:  6 Minute Walk     Row Name 12/17/22 1516         6 Minute Walk   Phase Initial     Distance 244 feet     Walk Time 2.83 minutes     # of Rest Breaks 1  2:10     MPH 0.97     RPE 15     Perceived Dyspnea  3     Symptoms Yes (comment)     Comments SOB     Resting HR 63 bpm     Resting BP 92/48     Resting Oxygen Saturation  92 %     Exercise Oxygen Saturation  during 6 min walk 84 %     Max Ex. HR 87 bpm     Max Ex. BP 138/82     2 Minute Post BP 134/70       Interval HR   1 Minute HR 66     2 Minute HR 68     3 Minute HR 67     4 Minute HR 63     5 Minute HR 63     6 Minute HR 87     2 Minute Post HR 64     Interval Heart Rate? Yes       Interval Oxygen   Interval Oxygen? Yes     Baseline Oxygen Saturation % 92 %     1 Minute Oxygen Saturation % 87 %     1 Minute Liters of Oxygen 3 L  pulsed     2 Minute Oxygen Saturation % 84 %     2 Minute Liters of Oxygen 3 L     3 Minute Oxygen Saturation % 91 %     3 Minute Liters of Oxygen 3 L     4 Minute Oxygen Saturation % 92 %     4 Minute Liters of Oxygen 3 L     5 Minute Oxygen Saturation % 93 %     5 Minute Liters of Oxygen 3 L     6 Minute Oxygen Saturation % 92 %     6 Minute Liters of Oxygen 3 L     2 Minute Post Oxygen Saturation % 87 %     2  Minute Post Liters of Oxygen 3 L              Oxygen Initial Assessment:  Oxygen Initial Assessment - 12/17/22 1455       Home Oxygen   Home Oxygen Device Home Concentrator;E-Tanks    Sleep Oxygen Prescription Continuous    Liters per minute 2    Home Resting Oxygen Prescription Continuous    Liters per minute 2    Compliance with Home Oxygen Use Yes      Intervention   Short Term Goals To learn and exhibit compliance with exercise, home and travel O2 prescription;To learn and understand importance of monitoring SPO2 with pulse oximeter and demonstrate accurate use of the pulse oximeter.;To learn and understand importance of maintaining oxygen saturations>88%;To learn and demonstrate proper pursed lip breathing techniques or other breathing techniques. ;To learn and demonstrate proper use of  respiratory medications    Long  Term Goals Exhibits compliance with exercise, home  and travel O2 prescription;Verbalizes importance of monitoring SPO2 with pulse oximeter and return demonstration;Maintenance of O2 saturations>88%;Exhibits proper breathing techniques, such as pursed lip breathing or other method taught during program session;Compliance with respiratory medication;Demonstrates proper use of MDI's             Oxygen Re-Evaluation:  Oxygen Re-Evaluation     Row Name 12/24/22 1350             Goals/Expected Outcomes   Short Term Goals To learn and demonstrate proper pursed lip breathing techniques or other breathing techniques.        Long  Term Goals Exhibits proper breathing techniques, such as pursed lip breathing or other method taught during program session       Comments Reviewed PLB technique with pt.  Talked about how it works and it's importance in maintaining their exercise saturations.       Goals/Expected Outcomes Short: Become more profiecient at using PLB.   Long: Become independent at using PLB.                Oxygen Discharge (Final Oxygen  Re-Evaluation):  Oxygen Re-Evaluation - 12/24/22 1350       Goals/Expected Outcomes   Short Term Goals To learn and demonstrate proper pursed lip breathing techniques or other breathing techniques.     Long  Term Goals Exhibits proper breathing techniques, such as pursed lip breathing or other method taught during program session    Comments Reviewed PLB technique with pt.  Talked about how it works and it's importance in maintaining their exercise saturations.    Goals/Expected Outcomes Short: Become more profiecient at using PLB.   Long: Become independent at using PLB.             Initial Exercise Prescription:  Initial Exercise Prescription - 12/17/22 1500       Date of Initial Exercise RX and Referring Provider   Date 12/17/22    Referring Provider Wallie Char MD      Oxygen   Oxygen Continuous    Liters 3    Maintain Oxygen Saturation 88% or higher      NuStep   Level 1    SPM 80    Minutes 30    METs 1.5      Prescription Details   Frequency (times per week) 2    Duration Progress to 30 minutes of continuous aerobic without signs/symptoms of physical distress      Intensity   THRR 40-80% of Max Heartrate 90-118    Ratings of Perceived Exertion 11-13    Perceived Dyspnea 0-4      Progression   Progression Continue to progress workloads to maintain intensity without signs/symptoms of physical distress.      Resistance Training   Training Prescription Yes    Weight 2 lb    Reps 10-15             Perform Capillary Blood Glucose checks as needed.  Exercise Prescription Changes:   Exercise Prescription Changes     Row Name 12/17/22 1500 12/24/22 1400           Response to Exercise   Blood Pressure (Admit) 92/48 106/50      Blood Pressure (Exercise) 134/82 110/60      Blood Pressure (Exit) 134/70 106/62      Heart Rate (Admit) 63 bpm 66 bpm  Heart Rate (Exercise) 87 bpm 69 bpm      Heart Rate (Exit) 64 bpm 65 bpm      Oxygen Saturation  (Admit) 92 % 94 %      Oxygen Saturation (Exercise) 84 % 95 %      Oxygen Saturation (Exit) 87 % 98 %      Rating of Perceived Exertion (Exercise) 15 13      Perceived Dyspnea (Exercise) 3 1      Symptoms SOB, fatigue --      Comments walk test results --      Duration -- Continue with 30 min of aerobic exercise without signs/symptoms of physical distress.      Intensity -- THRR unchanged        Progression   Progression -- Continue to progress workloads to maintain intensity without signs/symptoms of physical distress.        Resistance Training   Training Prescription -- Yes      Weight -- 2      Reps -- 10-15        Oxygen   Oxygen -- Continuous      Liters -- 3        NuStep   Level -- 1      SPM -- 48      Minutes -- 30      METs -- 1.6        Oxygen   Maintain Oxygen Saturation -- 88% or higher               Exercise Comments:   Exercise Comments     Row Name 12/24/22 1349           Exercise Comments First full day of exercise!  Patient was oriented to gym and equipment including functions, settings, policies, and procedures.  Patient's individual exercise prescription and treatment plan were reviewed.  All starting workloads were established based on the results of the 6 minute walk test done at initial orientation visit.  The plan for exercise progression was also introduced and progression will be customized based on patient's performance and goals.                Exercise Goals and Review:   Exercise Goals     Row Name 12/17/22 1526             Exercise Goals   Increase Physical Activity Yes       Intervention Provide advice, education, support and counseling about physical activity/exercise needs.;Develop an individualized exercise prescription for aerobic and resistive training based on initial evaluation findings, risk stratification, comorbidities and participant's personal goals.       Expected Outcomes Short Term: Attend rehab on a  regular basis to increase amount of physical activity.;Long Term: Exercising regularly at least 3-5 days a week.;Long Term: Add in home exercise to make exercise part of routine and to increase amount of physical activity.       Increase Strength and Stamina Yes       Intervention Provide advice, education, support and counseling about physical activity/exercise needs.;Develop an individualized exercise prescription for aerobic and resistive training based on initial evaluation findings, risk stratification, comorbidities and participant's personal goals.       Expected Outcomes Short Term: Increase workloads from initial exercise prescription for resistance, speed, and METs.;Short Term: Perform resistance training exercises routinely during rehab and add in resistance training at home;Long Term: Improve cardiorespiratory fitness, muscular endurance and strength as measured by  increased METs and functional capacity ( )       Able to understand and use rate of perceived exertion (RPE) scale Yes       Intervention Provide education and explanation on how to use RPE scale       Expected Outcomes Short Term: Able to use RPE daily in rehab to express subjective intensity level;Long Term:  Able to use RPE to guide intensity level when exercising independently       Able to understand and use Dyspnea scale Yes       Intervention Provide education and explanation on how to use Dyspnea scale       Expected Outcomes Short Term: Able to use Dyspnea scale daily in rehab to express subjective sense of shortness of breath during exertion;Long Term: Able to use Dyspnea scale to guide intensity level when exercising independently       Knowledge and understanding of Target Heart Rate Range (THRR) Yes       Intervention Provide education and explanation of THRR including how the numbers were predicted and where they are located for reference       Expected Outcomes Short Term: Able to state/look up THRR;Short Term:  Able to use daily as guideline for intensity in rehab;Long Term: Able to use THRR to govern intensity when exercising independently       Able to check pulse independently Yes       Intervention Provide education and demonstration on how to check pulse in carotid and radial arteries.;Review the importance of being able to check your own pulse for safety during independent exercise       Expected Outcomes Short Term: Able to explain why pulse checking is important during independent exercise;Long Term: Able to check pulse independently and accurately       Understanding of Exercise Prescription Yes       Intervention Provide education, explanation, and written materials on patient's individual exercise prescription       Expected Outcomes Short Term: Able to explain program exercise prescription;Long Term: Able to explain home exercise prescription to exercise independently                Exercise Goals Re-Evaluation :  Exercise Goals Re-Evaluation     Row Name 12/24/22 1349             Exercise Goal Re-Evaluation   Exercise Goals Review Able to understand and use rate of perceived exertion (RPE) scale;Knowledge and understanding of Target Heart Rate Range (THRR);Understanding of Exercise Prescription;Able to understand and use Dyspnea scale       Comments Reviewed RPE  and dyspnea scale, THR and program prescription with pt today.  Pt voiced understanding and was given a copy of goals to take home.       Expected Outcomes Short: Use RPE daily to regulate intensity.  Long: Follow program prescription in THR.                Discharge Exercise Prescription (Final Exercise Prescription Changes):  Exercise Prescription Changes - 12/24/22 1400       Response to Exercise   Blood Pressure (Admit) 106/50    Blood Pressure (Exercise) 110/60    Blood Pressure (Exit) 106/62    Heart Rate (Admit) 66 bpm    Heart Rate (Exercise) 69 bpm    Heart Rate (Exit) 65 bpm    Oxygen Saturation  (Admit) 94 %    Oxygen Saturation (Exercise) 95 %    Oxygen Saturation (Exit)  98 %    Rating of Perceived Exertion (Exercise) 13    Perceived Dyspnea (Exercise) 1    Duration Continue with 30 min of aerobic exercise without signs/symptoms of physical distress.    Intensity THRR unchanged      Progression   Progression Continue to progress workloads to maintain intensity without signs/symptoms of physical distress.      Resistance Training   Training Prescription Yes    Weight 2    Reps 10-15      Oxygen   Oxygen Continuous    Liters 3      NuStep   Level 1    SPM 48    Minutes 30    METs 1.6      Oxygen   Maintain Oxygen Saturation 88% or higher             Nutrition:  Target Goals: Understanding of nutrition guidelines, daily intake of sodium 1500mg , cholesterol 200mg , calories 30% from fat and 7% or less from saturated fats, daily to have 5 or more servings of fruits and vegetables.  Biometrics:  Pre Biometrics - 12/17/22 1526       Pre Biometrics   Height 5' (1.524 m)    Weight 156 lb 8 oz (71 kg)    Waist Circumference 39 inches    Hip Circumference 42.5 inches    Waist to Hip Ratio 0.92 %    BMI (Calculated) 30.56    Grip Strength 14.6 kg              Nutrition Therapy Plan and Nutrition Goals:   Nutrition Assessments:  MEDIFICTS Score Key: >=70 Need to make dietary changes  40-70 Heart Healthy Diet <= 40 Therapeutic Level Cholesterol Diet  Flowsheet Row PULMONARY REHAB OTHER RESP ORIENTATION from 12/17/2022 in [facility] CARDIAC REHABILITATION  Picture Your Plate Total Score on Admission 28      Picture Your Plate Scores: <66 Unhealthy dietary pattern with much room for improvement. 41-50 Dietary pattern unlikely to meet recommendations for good health and room for improvement. 51-60 More healthful dietary pattern, with some room for improvement.  >60 Healthy dietary pattern, although there may be some specific behaviors that could  be improved.    Nutrition Goals Re-Evaluation:   Nutrition Goals Discharge (Final Nutrition Goals Re-Evaluation):   Psychosocial: Target Goals: Acknowledge presence or absence of significant depression and/or stress, maximize coping skills, provide positive support system. Participant is able to verbalize types and ability to use techniques and skills needed for reducing stress and depression.  Initial Review & Psychosocial Screening:  Initial Psych Review & Screening - 12/17/22 1500       Initial Review   Current issues with Current Depression;Current Stress Concerns;Current Sleep Concerns    Source of Stress Concerns Unable to participate in former interests or hobbies;Chronic Illness      Family Dynamics   Good Support System? Yes   Patient's son and daughter are her support people.     Barriers   Psychosocial barriers to participate in program The patient should benefit from training in stress management and relaxation.      Screening Interventions   Interventions Encouraged to exercise;To provide support and resources with identified psychosocial needs;Provide feedback about the scores to participant    Expected Outcomes Short Term goal: Utilizing psychosocial counselor, staff and physician to assist with identification of specific Stressors or current issues interfering with healing process. Setting desired goal for each stressor or current issue identified.;Long Term Goal:  Stressors or current issues are controlled or eliminated.;Short Term goal: Identification and review with participant of any Quality of Life or Depression concerns found by scoring the questionnaire.;Long Term goal: The participant improves quality of Life and PHQ9 Scores as seen by post scores and/or verbalization of changes             Quality of Life Scores:  Scores of 19 and below usually indicate a poorer quality of life in these areas.  A difference of  2-3 points is a clinically meaningful  difference.  A difference of 2-3 points in the total score of the Quality of Life Index has been associated with significant improvement in overall quality of life, self-image, physical symptoms, and general health in studies assessing change in quality of life.   PHQ-9: Review Flowsheet       12/17/2022  Depression screen PHQ 2/9  Decreased Interest 3  Down, Depressed, Hopeless 1  PHQ - 2 Score 4  Altered sleeping 3  Tired, decreased energy 3  Change in appetite 2  Feeling bad or failure about yourself  1  Trouble concentrating 3  Moving slowly or fidgety/restless 0  Suicidal thoughts 0  PHQ-9 Score 16  Difficult doing work/chores Somewhat difficult    Details           Interpretation of Total Score  Total Score Depression Severity:  1-4 = Minimal depression, 5-9 = Mild depression, 10-14 = Moderate depression, 15-19 = Moderately severe depression, 20-27 = Severe depression   Psychosocial Evaluation and Intervention:  Psychosocial Evaluation - 12/17/22 1502       Psychosocial Evaluation & Interventions   Interventions Stress management education;Relaxation education;Encouraged to exercise with the program and follow exercise prescription    Comments Patient was referred to PR with pulmonary HTN. She was accompained today by her son and daughter. Her PHQ-9 score was 16 due to sleeping issues caused by nocturia, lack of interest in doing things, poor appepite and lack of energy. She was hospitalized in April with HF exacerbation and was discharged to SNF and then went to an assistive living facility in June. She says she has been down and depressed since moving into [facility]. She wants to go back to her home and get back to living independently. She has never been treated for depression or anxiety and says she is not willing to be treated at this time. 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 She is hard of hearting but very alert and orientated. She was discharged from the hospital  on O2. She says she has tried outpatient PT and was not able to complete the sessions due to SOB and fatigue so she is very skeptical that she will be able to participate in PR but is willing to try since her son and daughter want her to try. They both say she was not using O2 when she attempted PT and they feel she will be able to participate. They will provide transportation or transportation will be provided by [facility]. Her son and daughter say her diuretic was increased  at her last visit to the HF clinic and since this she has been staying in her room not going to activities or to the dining room to eat like she was doing previously. They think this is mainly due to fear of her having to urinate and soiling herself. The patient says she has to go to the bathroom every 15 minutes and this is why she does not leave her  room. She does wear a pull-up in case she can't make it to the bathroom. She says her goals for the program are to breathe better and feel better overall. Her lack of motivation could be a barrier for her to participate in the program.    Expected Outcomes Short term: Attend sessions. Long Term: Breathe better and feel better overall.    Continue Psychosocial Services  Follow up required by staff             Psychosocial Re-Evaluation:   Psychosocial Discharge (Final Psychosocial Re-Evaluation):    Education: Education Goals: Education classes will be provided on a weekly basis, covering required topics. Participant will state understanding/return demonstration of topics presented.  Learning Barriers/Preferences:  Learning Barriers/Preferences - 12/17/22 1458       Learning Barriers/Preferences   Learning Barriers Hearing    Learning Preferences Written Material;Audio;Skilled Demonstration             Education Topics: How Lungs Work and Diseases: - Discuss the anatomy of the lungs and diseases that can affect the lungs, such as COPD.   Exercise: -Discuss  the importance of exercise, FITT principles of exercise, normal and abnormal responses to exercise, and how to exercise safely.   Environmental Irritants: -Discuss types of environmental irritants and how to limit exposure to environmental irritants.   Meds/Inhalers and oxygen: - Discuss respiratory medications, definition of an inhaler and oxygen, and the proper way to use an inhaler and oxygen.   Energy Saving Techniques: - Discuss methods to conserve energy and decrease shortness of breath when performing activities of daily living.    Bronchial Hygiene / Breathing Techniques: - Discuss breathing mechanics, pursed-lip breathing technique,  proper posture, effective ways to clear airways, and other functional breathing techniques   Cleaning Equipment: - Provides group verbal and written instruction about the health risks of elevated stress, cause of high stress, and healthy ways to reduce stress.   Nutrition I: Fats: - Discuss the types of cholesterol, what cholesterol does to the body, and how cholesterol levels can be controlled.   Nutrition II: Labels: -Discuss the different components of food labels and how to read food labels.   Respiratory Infections: - Discuss the signs and symptoms of respiratory infections, ways to prevent respiratory infections, and the importance of seeking medical treatment when having a respiratory infection.   Stress I: Signs and Symptoms: - Discuss the causes of stress, how stress may lead to anxiety and depression, and ways to limit stress.   Stress II: Relaxation: -Discuss relaxation techniques to limit stress.   Oxygen for Home/Travel: - Discuss how to prepare for travel when on oxygen and proper ways to transport and store oxygen to ensure safety.   Knowledge Questionnaire Score:  Knowledge Questionnaire Score - 12/17/22 1458       Knowledge Questionnaire Score   Pre Score 16/18             Core Components/Risk  Factors/Patient Goals at Admission:  Personal Goals and Risk Factors at Admission - 12/17/22 1459       Core Components/Risk Factors/Patient Goals on Admission    Weight Management Yes;Obesity;Weight Loss    Intervention Weight Management: Develop a combined nutrition and exercise program designed to reach desired caloric intake, while maintaining appropriate intake of nutrient and fiber, sodium and fats, and appropriate energy expenditure required for the weight goal.;Weight Management: Provide education and appropriate resources to help participant work on and attain dietary goals.;Weight Management/Obesity: Establish reasonable short term  and long term weight goals.;Obesity: Provide education and appropriate resources to help participant work on and attain dietary goals.    Admit Weight 156 lb 8 oz (71 kg)    Goal Weight: Short Term 150 lb (68 kg)    Goal Weight: Long Term 150 lb (68 kg)    Expected Outcomes Short Term: Continue to assess and modify interventions until short term weight is achieved;Long Term: Adherence to nutrition and physical activity/exercise program aimed toward attainment of established weight goal;Weight Loss: Understanding of general recommendations for a balanced deficit meal plan, which promotes 1-2 lb weight loss per week and includes a negative energy balance of [phone] kcal/d;Understanding recommendations for meals to include 15-35% energy as protein, 25-35% energy from fat, 35-60% energy from carbohydrates, less than 200mg  of dietary cholesterol, 20-35 gm of total fiber daily;Understanding of distribution of calorie intake throughout the day with the consumption of 4-5 meals/snacks    Improve shortness of breath with ADL's Yes    Intervention Provide education, individualized exercise plan and daily activity instruction to help decrease symptoms of SOB with activities of daily living.    Expected Outcomes Short Term: Improve cardiorespiratory fitness to achieve a  reduction of symptoms when performing ADLs;Long Term: Be able to perform more ADLs without symptoms or delay the onset of symptoms    Increase knowledge of respiratory medications and ability to use respiratory devices properly  Yes    Intervention Provide education and demonstration as needed of appropriate use of medications, inhalers, and oxygen therapy.    Expected Outcomes Short Term: Achieves understanding of medications use. Understands that oxygen is a medication prescribed by physician. Demonstrates appropriate use of inhaler and oxygen therapy.;Long Term: Maintain appropriate use of medications, inhalers, and oxygen therapy.    Heart Failure Yes    Intervention Provide a combined exercise and nutrition program that is supplemented with education, support and counseling about heart failure. Directed toward relieving symptoms such as shortness of breath, decreased exercise tolerance, and extremity edema.    Expected Outcomes Improve functional capacity of life;Short term: Attendance in program 2-3 days a week with increased exercise capacity. Reported lower sodium intake. Reported increased fruit and vegetable intake. Reports medication compliance.;Short term: Daily weights obtained and reported for increase. Utilizing diuretic protocols set by physician.;Long term: Adoption of self-care skills and reduction of barriers for early signs and symptoms recognition and intervention leading to self-care maintenance.    Hypertension Yes    Intervention Monitor prescription use compliance.;Provide education on lifestyle modifcations including regular physical activity/exercise, weight management, moderate sodium restriction and increased consumption of fresh fruit, vegetables, and low fat dairy, alcohol moderation, and smoking cessation.    Expected Outcomes Short Term: Continued assessment and intervention until BP is < 140/43mm HG in hypertensive participants. < 130/38mm HG in hypertensive participants  with diabetes, heart failure or chronic kidney disease.;Long Term: Maintenance of blood pressure at goal levels.    Lipids Yes    Intervention Provide education and support for participant on nutrition & aerobic/resistive exercise along with prescribed medications to achieve LDL 70mg , HDL >40mg .    Expected Outcomes Short Term: Participant states understanding of desired cholesterol values and is compliant with medications prescribed. Participant is following exercise prescription and nutrition guidelines.;Long Term: Cholesterol controlled with medications as prescribed, with individualized exercise RX and with personalized nutrition plan. Value goals: LDL < 70mg , HDL > 40 mg.             Core Components/Risk Factors/Patient Goals Review:  Core Components/Risk Factors/Patient Goals at Discharge (Final Review):    ITP Comments:  ITP Comments     Row Name 12/24/22 1349 12/30/22 1518         ITP Comments First full day of exercise!  Patient was oriented to gym and equipment including functions, settings, policies, and procedures.  Patient's individual exercise prescription and treatment plan were reviewed.  All starting workloads were established based on the results of the 6 minute walk test done at initial orientation visit.  The plan for exercise progression was also introduced and progression will be customized based on patient's performance and goals. 30 day review completed. ITP sent to Dr.Jehanzeb Memon, Medical Director of  Pulmonary Rehab. Continue with ITP unless changes are made by physician.  New to program               Comments: 30 day review

## 2022-12-31 ENCOUNTER — Encounter (HOSPITAL_COMMUNITY)
Admission: RE | Admit: 2022-12-31 | Discharge: 2022-12-31 | Disposition: A | Discharge status: Home / Self Care | Payer: Medicare PPO | Source: Ambulatory Visit | Attending: Internal Medicine | Admitting: Internal Medicine

## 2022-12-31 DIAGNOSIS — I5032 Chronic diastolic (congestive) heart failure: Secondary | ICD-10-CM | POA: Diagnosis not present

## 2022-12-31 DIAGNOSIS — I272 Pulmonary hypertension, unspecified: Secondary | ICD-10-CM | POA: Diagnosis not present

## 2022-12-31 DIAGNOSIS — D631 Anemia in chronic kidney disease: Secondary | ICD-10-CM | POA: Diagnosis not present

## 2022-12-31 DIAGNOSIS — N1832 Chronic kidney disease, stage 3b: Secondary | ICD-10-CM | POA: Diagnosis not present

## 2022-12-31 NOTE — Progress Notes (Signed)
Daily Session Note  Patient Details  Name: [name] [name] MRN: [id number] Date of Birth: [birth year] Referring Provider:   Flowsheet Row PULMONARY REHAB OTHER RESP ORIENTATION from 12/17/2022 in [facility] CARDIAC REHABILITATION  Referring Provider Wallie Char MD       Encounter Date: 12/31/2022  Check In:  Session Check In - 12/31/22 1315       Check-In   Supervising physician immediately available to respond to emergencies [facility] MD immediately available    Physician(s) Dr. Diona Browner    Location [facility]    Staff Present Ross Ludwig, BS, Exercise Physiologist;Rynn Markiewicz BSN, RN;Daphyne Daphine Deutscher, RN, BSN    Virtual Visit No    Medication changes reported     No    Fall or balance concerns reported    Yes    Comments walks with a rollator and the pt is taking Demadex-she has slipped one time in the past year    Tobacco Cessation No Change    Warm-up and Cool-down Performed on first and last piece of equipment    Resistance Training Performed Yes    VAD Patient? No    PAD/SET Patient? No      Pain Assessment   Currently in Pain? No/denies    Multiple Pain Sites No             Capillary Blood Glucose: No results found for this or any previous visit (from the past 24 hour(s)).    Social History   Tobacco Use  Smoking Status Never  Smokeless Tobacco Never    Goals Met:  Proper associated with RPD/PD & O2 Sat Independence with exercise equipment Using PLB without cueing & demonstrates good technique Exercise tolerated well Queuing for purse lip breathing No report of concerns or symptoms today Strength training completed today  Goals Unmet:  Not Applicable  Comments: Marland KitchenMarland KitchenPt able to follow exercise prescription today without complaint.  Will continue to monitor for progression.    Dr. Erick Blinks is Medical Director for [facility] Pulmonary Rehab.

## 2023-01-01 ENCOUNTER — Other Ambulatory Visit: Payer: Self-pay

## 2023-01-01 DIAGNOSIS — G4733 Obstructive sleep apnea (adult) (pediatric): Secondary | ICD-10-CM

## 2023-01-01 NOTE — Progress Notes (Signed)
Patient agreement reviewed and signed on 10/26/2022.  WatchPAT issued to patient on 10/26/2022 by Bea Laura, RN. Patient aware to not open the WatchPAT box until contacted with the activation PIN. Patient profile initialized in CloudPAT on 10/26/2022 by unkown. Device serial number: [id number]

## 2023-01-04 ENCOUNTER — Encounter (INDEPENDENT_AMBULATORY_CARE_PROVIDER_SITE_OTHER): Payer: Medicare PPO | Admitting: Emergency Medicine

## 2023-01-04 VITALS — BP 118/48 | HR 60 | Temp 98.3°F | Resp 18

## 2023-01-04 DIAGNOSIS — I5032 Chronic diastolic (congestive) heart failure: Secondary | ICD-10-CM | POA: Diagnosis not present

## 2023-01-04 DIAGNOSIS — N1832 Chronic kidney disease, stage 3b: Secondary | ICD-10-CM

## 2023-01-04 DIAGNOSIS — D631 Anemia in chronic kidney disease: Secondary | ICD-10-CM

## 2023-01-04 MED ORDER — DIPHENHYDRAMINE HCL 25 MG PO CAPS
25.0000 mg | ORAL_CAPSULE | Freq: Once | ORAL | Status: AC
  Administered 2023-01-04: 25 mg via ORAL
  Filled 2023-01-04: qty 1

## 2023-01-04 MED ORDER — SODIUM CHLORIDE 0.9 % IV SOLN
300.0000 mg | Freq: Once | INTRAVENOUS | Status: AC
  Administered 2023-01-04: 300 mg via INTRAVENOUS
  Filled 2023-01-04: qty 15

## 2023-01-04 MED ORDER — ACETAMINOPHEN 325 MG PO TABS
650.0000 mg | ORAL_TABLET | Freq: Once | ORAL | Status: AC
  Administered 2023-01-04: 650 mg via ORAL
  Filled 2023-01-04: qty 2

## 2023-01-04 NOTE — Progress Notes (Signed)
Diagnosis: Iron Deficiency Anemia  Provider:   Prince Rome   Procedure: IV Infusion  IV Type: Peripheral, IV Location: L Antecubital  Venofer (Iron Sucrose), Dose: 300 mg  Infusion Start Time: 1340  Infusion Stop Time: 1524  Post Infusion IV Care: Observation period completed and Peripheral IV Discontinued  Discharge: Condition: Good, Destination: Home . AVS Provided  Performed by:  Arrie Senate, RN

## 2023-01-05 ENCOUNTER — Encounter (HOSPITAL_COMMUNITY)
Admission: RE | Admit: 2023-01-05 | Discharge: 2023-01-05 | Disposition: A | Discharge status: Home / Self Care | Payer: Medicare PPO | Source: Ambulatory Visit | Attending: Internal Medicine | Admitting: Internal Medicine

## 2023-01-05 DIAGNOSIS — D631 Anemia in chronic kidney disease: Secondary | ICD-10-CM | POA: Diagnosis not present

## 2023-01-05 DIAGNOSIS — I272 Pulmonary hypertension, unspecified: Secondary | ICD-10-CM

## 2023-01-05 DIAGNOSIS — N1832 Chronic kidney disease, stage 3b: Secondary | ICD-10-CM | POA: Diagnosis not present

## 2023-01-05 DIAGNOSIS — I5032 Chronic diastolic (congestive) heart failure: Secondary | ICD-10-CM | POA: Diagnosis not present

## 2023-01-05 NOTE — Progress Notes (Signed)
Daily Session Note  Patient Details  Name: PAULENA KIANG MRN: [id number] Date of Birth: 1931/08/14 Referring Provider:   Flowsheet Row PULMONARY REHAB OTHER RESP ORIENTATION from 12/17/2022 in [facility] CARDIAC REHABILITATION  Referring Provider Wallie Char MD       Encounter Date: 01/05/2023  Check In:  Session Check In - 01/05/23 1330       Check-In   Supervising physician immediately available to respond to emergencies See telemetry face sheet for immediately available MD    Location [facility]    Staff Present Ross Ludwig, BS, Exercise Physiologist;Alano Blasco, RN;Daphyne [place], RN, Thomos Lemons, MA, RCEP, CCRP, CCET    Virtual Visit No    Medication changes reported     No    Fall or balance concerns reported    Yes    Comments walks with a rollator and the pt is taking Demadex-she has slipped one time in the past year    Warm-up and Cool-down Performed on first and last piece of equipment    Resistance Training Performed Yes    VAD Patient? No    PAD/SET Patient? No      Pain Assessment   Currently in Pain? No/denies    Multiple Pain Sites No             Capillary Blood Glucose: No results found for this or any previous visit (from the past 24 hour(s)).    Social History   Tobacco Use  Smoking Status Never  Smokeless Tobacco Never    Goals Met:  Proper associated with RPD/PD & O2 Sat Independence with exercise equipment Using PLB without cueing & demonstrates good technique Exercise tolerated well No report of concerns or symptoms today  Goals Unmet:  Not Applicable  Comments: Pt able to follow exercise prescription today without complaint.  Will continue to monitor for progression.    Dr. Erick Blinks is Medical Director for [facility] Pulmonary Rehab.

## 2023-01-07 ENCOUNTER — Encounter (HOSPITAL_COMMUNITY)
Admission: RE | Admit: 2023-01-07 | Discharge: 2023-01-07 | Disposition: A | Discharge status: Home / Self Care | Payer: Medicare PPO | Source: Ambulatory Visit | Attending: Internal Medicine | Admitting: Internal Medicine

## 2023-01-07 DIAGNOSIS — R0609 Other forms of dyspnea: Secondary | ICD-10-CM | POA: Diagnosis not present

## 2023-01-07 DIAGNOSIS — I272 Pulmonary hypertension, unspecified: Secondary | ICD-10-CM | POA: Diagnosis not present

## 2023-01-07 DIAGNOSIS — N1832 Chronic kidney disease, stage 3b: Secondary | ICD-10-CM | POA: Diagnosis not present

## 2023-01-07 DIAGNOSIS — D631 Anemia in chronic kidney disease: Secondary | ICD-10-CM | POA: Diagnosis not present

## 2023-01-07 DIAGNOSIS — I5032 Chronic diastolic (congestive) heart failure: Secondary | ICD-10-CM | POA: Diagnosis not present

## 2023-01-07 DIAGNOSIS — R0602 Shortness of breath: Secondary | ICD-10-CM | POA: Diagnosis not present

## 2023-01-07 NOTE — Progress Notes (Signed)
Daily Session Note  Patient Details  Name: [name] [name] MRN: [id number] Date of Birth: May 01, 1931 Referring Provider:   Flowsheet Row PULMONARY REHAB OTHER RESP ORIENTATION from 12/17/2022 in [facility] CARDIAC REHABILITATION  Referring Provider Wallie Char MD       Encounter Date: 01/07/2023  Check In:  Session Check In - 01/07/23 1330       Check-In   Supervising physician immediately available to respond to emergencies See telemetry face sheet for immediately available MD    Location [facility]    Staff Present Ross Ludwig, BS, Exercise Physiologist;Nicolas Sisler Daphine Deutscher, RN, BSN;Jessica Hawkins, MA, RCEP, CCRP, CCET    Virtual Visit No    Medication changes reported     No    Fall or balance concerns reported    Yes    Comments walks with a rollator and the pt is taking Demadex-she has slipped one time in the past year    Tobacco Cessation No Change    Warm-up and Cool-down Performed on first and last piece of equipment    Resistance Training Performed Yes    VAD Patient? No      Pain Assessment   Currently in Pain? No/denies             Capillary Blood Glucose: No results found for this or any previous visit (from the past 24 hour(s)).    Social History   Tobacco Use  Smoking Status Never  Smokeless Tobacco Never    Goals Met:  Proper associated with RPD/PD & O2 Sat Independence with exercise equipment Using PLB without cueing & demonstrates good technique Exercise tolerated well Queuing for purse lip breathing No report of concerns or symptoms today Strength training completed today  Goals Unmet:  Not Applicable  Comments: Pt able to follow exercise prescription today without complaint.  Will continue to monitor for progression.    Dr. Erick Blinks is Medical Director for [facility] Pulmonary Rehab.

## 2023-01-12 ENCOUNTER — Encounter (HOSPITAL_COMMUNITY)
Admission: RE | Admit: 2023-01-12 | Discharge: 2023-01-12 | Disposition: A | Discharge status: Home / Self Care | Payer: Medicare PPO | Source: Ambulatory Visit | Attending: Internal Medicine | Admitting: Internal Medicine

## 2023-01-12 DIAGNOSIS — I272 Pulmonary hypertension, unspecified: Secondary | ICD-10-CM | POA: Diagnosis not present

## 2023-01-12 DIAGNOSIS — N1832 Chronic kidney disease, stage 3b: Secondary | ICD-10-CM | POA: Diagnosis not present

## 2023-01-12 DIAGNOSIS — I5032 Chronic diastolic (congestive) heart failure: Secondary | ICD-10-CM | POA: Diagnosis not present

## 2023-01-12 DIAGNOSIS — D631 Anemia in chronic kidney disease: Secondary | ICD-10-CM | POA: Diagnosis not present

## 2023-01-12 NOTE — Progress Notes (Signed)
Daily Session Note  Patient Details  Name: [name] [name] MRN: [id number] Date of Birth: 01-Nov-1931 Referring Provider:   Flowsheet Row PULMONARY REHAB OTHER RESP ORIENTATION from 12/17/2022 in [facility] CARDIAC REHABILITATION  Referring Provider Wallie Char MD       Encounter Date: 01/12/2023  Check In:  Session Check In - 01/12/23 1330       Check-In   Supervising physician immediately available to respond to emergencies See telemetry face sheet for immediately available MD    Location [facility]    Staff Present Ross Ludwig, BS, Exercise Physiologist;Azarah Dacy, RN;Daphyne [place], RN, Thomos Lemons, MA, RCEP, CCRP, CCET    Virtual Visit No    Medication changes reported     No    Fall or balance concerns reported    Yes    Comments walks with a rollator and the pt is taking Demadex-she has slipped one time in the past year    Tobacco Cessation No Change    Warm-up and Cool-down Performed on first and last piece of equipment    Resistance Training Performed Yes    VAD Patient? No    PAD/SET Patient? No      Pain Assessment   Currently in Pain? No/denies    Multiple Pain Sites No             Capillary Blood Glucose: No results found for this or any previous visit (from the past 24 hour(s)).    Social History   Tobacco Use  Smoking Status Never  Smokeless Tobacco Never    Goals Met:  Proper associated with RPD/PD & O2 Sat Using PLB without cueing & demonstrates good technique Exercise tolerated well No report of concerns or symptoms today Strength training completed today  Goals Unmet:  Not Applicable  Comments: Pt able to follow exercise prescription today without complaint.  Will continue to monitor for progression.    Dr. Erick Blinks is Medical Director for [facility] Pulmonary Rehab.

## 2023-01-14 ENCOUNTER — Encounter (HOSPITAL_COMMUNITY)
Admission: RE | Admit: 2023-01-14 | Discharge: 2023-01-14 | Disposition: A | Discharge status: Home / Self Care | Payer: Medicare PPO | Source: Ambulatory Visit | Attending: Internal Medicine | Admitting: Internal Medicine

## 2023-01-14 DIAGNOSIS — I5032 Chronic diastolic (congestive) heart failure: Secondary | ICD-10-CM | POA: Diagnosis not present

## 2023-01-14 DIAGNOSIS — N1832 Chronic kidney disease, stage 3b: Secondary | ICD-10-CM | POA: Diagnosis not present

## 2023-01-14 DIAGNOSIS — I272 Pulmonary hypertension, unspecified: Secondary | ICD-10-CM | POA: Diagnosis not present

## 2023-01-14 DIAGNOSIS — D631 Anemia in chronic kidney disease: Secondary | ICD-10-CM | POA: Diagnosis not present

## 2023-01-14 NOTE — Progress Notes (Signed)
Daily Session Note  Patient Details  Name: [name] [name] MRN: [id number] Date of Birth: 11/09/1931 Referring Provider:   Flowsheet Row PULMONARY REHAB OTHER RESP ORIENTATION from 12/17/2022 in [facility] CARDIAC REHABILITATION  Referring Provider Wallie Char MD       Encounter Date: 01/14/2023  Check In:  Session Check In - 01/14/23 1315       Check-In   Supervising physician immediately available to respond to emergencies See telemetry face sheet for immediately available ER MD    Location [facility]    Staff Present Rodena Medin, RN, BSN;Heather Gaynell Face, Exercise Physiologist;Hillary Leonidas Romberg BSN, RN    Virtual Visit No    Medication changes reported     No    Fall or balance concerns reported    Yes    Comments walks with a rollator and the pt is taking Demadex-she has slipped one time in the past year    Warm-up and Cool-down Performed on first and last piece of equipment    Resistance Training Performed Yes    VAD Patient? No    PAD/SET Patient? No      Pain Assessment   Currently in Pain? No/denies    Multiple Pain Sites No             Capillary Blood Glucose: No results found for this or any previous visit (from the past 24 hour(s)).    Social History   Tobacco Use  Smoking Status Never  Smokeless Tobacco Never    Goals Met:  Proper associated with RPD/PD & O2 Sat Independence with exercise equipment Using PLB without cueing & demonstrates good technique Exercise tolerated well No report of concerns or symptoms today Strength training completed today  Goals Unmet:  Not Applicable  Comments: Pt able to follow exercise prescription today without complaint.  Will continue to monitor for progression.    Dr. Erick Blinks is Medical Director for [facility] Pulmonary Rehab.

## 2023-01-16 DIAGNOSIS — I5033 Acute on chronic diastolic (congestive) heart failure: Secondary | ICD-10-CM | POA: Diagnosis not present

## 2023-01-16 DIAGNOSIS — J9610 Chronic respiratory failure, unspecified whether with hypoxia or hypercapnia: Secondary | ICD-10-CM | POA: Diagnosis not present

## 2023-01-16 DIAGNOSIS — J449 Chronic obstructive pulmonary disease, unspecified: Secondary | ICD-10-CM | POA: Diagnosis not present

## 2023-01-19 ENCOUNTER — Encounter (HOSPITAL_COMMUNITY)
Admission: RE | Admit: 2023-01-19 | Discharge: 2023-01-19 | Disposition: A | Discharge status: Home / Self Care | Payer: Medicare PPO | Source: Ambulatory Visit | Attending: Internal Medicine | Admitting: Internal Medicine

## 2023-01-19 DIAGNOSIS — I272 Pulmonary hypertension, unspecified: Secondary | ICD-10-CM | POA: Insufficient documentation

## 2023-01-19 NOTE — Progress Notes (Signed)
Daily Session Note  Patient Details  Name: [name] [name] MRN: [id number] Date of Birth: October 04, 1931 Referring Provider:   Flowsheet Row PULMONARY REHAB OTHER RESP ORIENTATION from 12/17/2022 in [facility] CARDIAC REHABILITATION  Referring Provider Wallie Char MD       Encounter Date: 01/19/2023  Check In:   Capillary Blood Glucose: No results found for this or any previous visit (from the past 24 hour(s)).    Social History   Tobacco Use  Smoking Status Never  Smokeless Tobacco Never    Goals Met:  Proper associated with RPD/PD & O2 Sat Independence with exercise equipment Using PLB without cueing & demonstrates good technique Exercise tolerated well Queuing for purse lip breathing No report of concerns or symptoms today Strength training completed today  Goals Unmet:  Not Applicable  Comments: Pt able to follow exercise prescription today without complaint.  Will continue to monitor for progression.    Dr. Erick Blinks is Medical Director for [facility] Pulmonary Rehab.

## 2023-01-21 ENCOUNTER — Encounter (HOSPITAL_COMMUNITY)
Admission: RE | Admit: 2023-01-21 | Discharge: 2023-01-21 | Disposition: A | Discharge status: Home / Self Care | Payer: Medicare PPO | Source: Ambulatory Visit | Attending: Internal Medicine | Admitting: Internal Medicine

## 2023-01-21 DIAGNOSIS — I272 Pulmonary hypertension, unspecified: Secondary | ICD-10-CM

## 2023-01-21 NOTE — Progress Notes (Signed)
Daily Session Note  Patient Details  Name: MARA FAVERO MRN: [id number] Date of Birth: 1931/04/21 Referring Provider:   Flowsheet Row PULMONARY REHAB OTHER RESP ORIENTATION from 12/17/2022 in [facility] CARDIAC REHABILITATION  Referring Provider Wallie Char MD       Encounter Date: 01/21/2023  Check In:  Session Check In - 01/21/23 1315       Check-In   Supervising physician immediately available to respond to emergencies See telemetry face sheet for immediately available MD    Location [facility]    Staff Present Ross Ludwig, BS, Exercise Physiologist;Debra Laural Benes, RN, Pleas Koch, RN, BSN    Virtual Visit No    Medication changes reported     No    Fall or balance concerns reported    Yes    Comments walks with a rollator and the pt is taking Demadex-she has slipped one time in the past year    Tobacco Cessation No Change    Warm-up and Cool-down Performed on first and last piece of equipment    Resistance Training Performed Yes    VAD Patient? No      Pain Assessment   Currently in Pain? No/denies             Capillary Blood Glucose: No results found for this or any previous visit (from the past 24 hour(s)).    Social History   Tobacco Use  Smoking Status Never  Smokeless Tobacco Never    Goals Met:  Proper associated with RPD/PD & O2 Sat Independence with exercise equipment Using PLB without cueing & demonstrates good technique Exercise tolerated well Queuing for purse lip breathing No report of concerns or symptoms today Strength training completed today  Goals Unmet:  Not Applicable  Comments: Pt able to follow exercise prescription today without complaint.  Will continue to monitor for progression.    Dr. Erick Blinks is Medical Director for [facility] Pulmonary Rehab.

## 2023-01-25 ENCOUNTER — Encounter: Payer: Self-pay | Admitting: Cardiology

## 2023-01-25 ENCOUNTER — Ambulatory Visit: Payer: Medicare PPO | Attending: Cardiology | Admitting: Cardiology

## 2023-01-25 VITALS — BP 122/62 | HR 68 | Ht 60.0 in | Wt 161.0 lb

## 2023-01-25 DIAGNOSIS — I5032 Chronic diastolic (congestive) heart failure: Secondary | ICD-10-CM | POA: Diagnosis not present

## 2023-01-25 DIAGNOSIS — I272 Pulmonary hypertension, unspecified: Secondary | ICD-10-CM

## 2023-01-25 DIAGNOSIS — J9611 Chronic respiratory failure with hypoxia: Secondary | ICD-10-CM

## 2023-01-25 DIAGNOSIS — I480 Paroxysmal atrial fibrillation: Secondary | ICD-10-CM

## 2023-01-25 DIAGNOSIS — Z95810 Presence of automatic (implantable) cardiac defibrillator: Secondary | ICD-10-CM | POA: Diagnosis not present

## 2023-01-25 NOTE — Progress Notes (Signed)
Cardiology Office Note  Date: 01/25/2023   ID: Skila, Angeletti 06-26-1931, MRN [id number]  History of Present Illness: [name] [name] is an 89 y.o. female last seen in August by Ms. Strader NP and subsequently in the heart failure clinic, I reviewed these notes.  She is here today with her son for a follow-up visit, still resides at [place].  She is using a rolling walker, also participating in pulmonary rehabilitation.  She reports NYHA class II-III dyspnea as before, leg swelling better after up titration of diuretics per the heart failure team.  I reviewed her most recent lab work as noted below.  Medtronic ICD in place with follow-up by Dr. Ladona Ridgel.  Device interrogation in July indicated 0.5% atrial arrhythmia burden.  She remains on Tikosyn and Eliquis.  Other than easy bruising, she reports no major spontaneous bleeding problems.  Still wearing supplemental oxygen, weight is relatively stable, fluctuating up or down by few pounds.  Creatinine up to 2.1 with potassium 5.1 by recent lab work on current diuretic regimen.  Physical Exam: VS:  BP 122/62 (BP Location: Right Arm, Patient Position: Sitting, Cuff Size: Normal)   Pulse 68   Ht 5' (1.524 m)   Wt 161 lb (73 kg)   SpO2 93%   BMI 31.44 kg/m , BMI Body mass index is 31.44 kg/m.  Wt Readings from Last 3 Encounters:  01/25/23 161 lb (73 kg)  12/17/22 156 lb 8 oz (71 kg)  12/07/22 160 lb 12.8 oz (72.9 kg)    General: Patient appears comfortable at rest.  Wearing oxygen via nasal cannula. HEENT: Conjunctiva and lids normal. Neck: Supple, no elevated JVP or carotid bruits. Lungs: Decreased breath sounds without wheezing. Cardiac: Regular rate and rhythm, no S3, 2/6 systolic murmur. Extremities: Venous stasis and mild edema.  ECG:  An ECG dated 10/26/2022 was personally reviewed today and demonstrated:  Dual-chamber pacing.  Labwork: 04/23/2022: TSH 2.810 08/31/2022: ALT 17; AST 23 10/20/2022: Magnesium 2.5 12/07/2022: B  Natriuretic Peptide 864.9; Hemoglobin 12.1; Platelets 214 12/24/2022: BUN 42; Creatinine, Ser 2.10; Potassium 5.1; Sodium 143     Component Value Date/Time   CHOL 111 01/24/2013 0946   TRIG 119 01/24/2013 0946   HDL 31 (L) 01/24/2013 0946   CHOLHDL 3.6 01/24/2013 0946   VLDL 24 01/24/2013 0946   LDLCALC 56 01/24/2013 0946   Other Studies Reviewed Today:  Echocardiogram 07/08/2022:  1. Left ventricular ejection fraction, by estimation, is 50 to 55%. Left  ventricular ejection fraction by 3D volume is 50 %. The left ventricle has  low normal function. The left ventricle has no regional wall motion  abnormalities. Left ventricular  diastolic function could not be evaluated.   2. Right ventricular systolic function is moderately reduced. The right  ventricular size is moderately enlarged. There is moderately elevated  pulmonary artery systolic pressure. The estimated right ventricular  systolic pressure is 59.1 mmHg.   3. Left atrial size was severely dilated.   4. Right atrial size was severely dilated.   5. The mitral valve is normal in structure. Mild to moderate mitral valve  regurgitation.   6. Tricuspid valve regurgitation is moderate to severe.   7. The aortic valve is tricuspid. There is mild calcification of the  aortic valve. There is mild thickening of the aortic valve. Aortic valve  regurgitation is mild. Aortic valve sclerosis is present, with no evidence  of aortic valve stenosis.   8. The inferior vena cava is dilated in  size with <50% respiratory  variability, suggesting right atrial pressure of 15 mmHg.   Assessment and Plan:  1.  HFpEF with restrictive cardiomyopathy and WHO group 2/3 pulmonary hypertension, not candidate for pulmonary artery vasodilators.  She has NYHA class III dyspnea which is most likely multifactorial.  LVEF 50 to 55% by echocardiogram in March, RVSP estimated 59 mmHg.  Fluid status looks relatively stable, she has had up titration and diuretics  by heart failure team, currently on Demadex 100 mg in the morning and 40 mg in the afternoon.  Additional therapy includes potassium supplements, losartan, and Farxiga.  Keep follow-up in the heart failure clinic as scheduled next month.   2.  Multivessel CAD status post CABG in 1998 with LIMA to LAD, SVG to diagonal, and SVG to ramus intermedius/OM.  Subsequently underwent DES to the PLA in 2006.  No angina at current level of activity.  Continue Lipitor.   3.  Paroxysmal atrial fibrillation.  CHA2DS2-VASc score is 5.  She remains on Tikosyn, bisoprolol, and Eliquis.  Very low rhythm burden based on device interrogations.   4.  COPD with chronic hypoxic respiratory failure.  She continues to follow with Dr. Sherene Sires.   5.  CKD stage IV.  Recent follow-up creatinine 2.1 with potassium 5.1.   6.  Mixed hyperlipidemia.  LDL 58 in November 2023 on Lipitor.   7.  Medtronic ICD in place with follow-up by Dr. Ladona Ridgel.  No recent device shocks or syncope.  Disposition:  Follow up  3 months.  Signed, Jonelle Sidle, M.D., F.A.C.C. [name]

## 2023-01-25 NOTE — Patient Instructions (Addendum)
Medication Instructions:   Your physician recommends that you continue on your current medications as directed. Please refer to the Current Medication list given to you today.  Labwork:  none  Testing/Procedures:  none  Follow-Up:  Your physician recommends that you schedule a follow-up appointment in: 3 months.  Any Other Special Instructions Will Be Listed Below (If Applicable).  If you need a refill on your cardiac medications before your next appointment, please call your pharmacy. 

## 2023-01-26 ENCOUNTER — Encounter (HOSPITAL_COMMUNITY)
Admission: RE | Admit: 2023-01-26 | Discharge: 2023-01-26 | Disposition: A | Discharge status: Home / Self Care | Payer: Medicare PPO | Source: Ambulatory Visit | Attending: Internal Medicine | Admitting: Internal Medicine

## 2023-01-26 DIAGNOSIS — I272 Pulmonary hypertension, unspecified: Secondary | ICD-10-CM | POA: Diagnosis not present

## 2023-01-26 NOTE — Progress Notes (Signed)
Daily Session Note  Patient Details  Name: [name] [name] MRN: [id number] Date of Birth: [birth year] Referring Provider:   Flowsheet Row PULMONARY REHAB OTHER RESP ORIENTATION from 12/17/2022 in [facility]  Referring Provider Wallie Char MD       Encounter Date: 01/26/2023  Check In:  Session Check In - 01/26/23 1330       Check-In   Supervising physician immediately available to respond to emergencies See telemetry face sheet for immediately available MD    Location [facility]    Staff Present Ross Ludwig, BS, Exercise Physiologist;Serria Sloma, RN;Daphyne Waverly, RN, Thomos Lemons, MA, RCEP, CCRP, CCET    Virtual Visit No    Medication changes reported     No    Fall or balance concerns reported    Yes    Comments walks with a rollator and the pt is taking Demadex-she has slipped one time in the past year    Tobacco Cessation No Change    Warm-up and Cool-down Performed on first and last piece of equipment    Resistance Training Performed Yes    VAD Patient? No    PAD/SET Patient? No      Pain Assessment   Currently in Pain? No/denies    Multiple Pain Sites No             Capillary Blood Glucose: No results found for this or any previous visit (from the past 24 hour(s)).    Social History   Tobacco Use  Smoking Status Never  Smokeless Tobacco Never    Goals Met:  Independence with exercise equipment Improved SOB with ADL's Using PLB without cueing & demonstrates good technique Exercise tolerated well No report of concerns or symptoms today Strength training completed today  Goals Unmet:  Not Applicable  Comments: Pt able to follow exercise prescription today without complaint.  Will continue to monitor for progression.    Dr. Erick Blinks is Medical Director for [facility].

## 2023-01-27 ENCOUNTER — Encounter (HOSPITAL_COMMUNITY): Payer: Self-pay | Admitting: *Deleted

## 2023-01-27 DIAGNOSIS — I272 Pulmonary hypertension, unspecified: Secondary | ICD-10-CM

## 2023-01-27 NOTE — Progress Notes (Signed)
Pulmonary Individual Treatment Plan  Patient Details  Name: [name] [name] MRN: [id number] Date of Birth: 09-29-1931 Referring Provider:   Flowsheet Row PULMONARY REHAB OTHER RESP ORIENTATION from 12/17/2022 in [facility] CARDIAC REHABILITATION  Referring Provider Wallie Char MD       Initial Encounter Date:  Flowsheet Row PULMONARY REHAB OTHER RESP ORIENTATION from 12/17/2022 in [place] CARDIAC REHABILITATION  Date 12/17/22       Visit Diagnosis: Pulmonary hypertension (HCC)  Patient's Home Medications on Admission:   Current Outpatient Medications:    acetaminophen (TYLENOL) 325 MG tablet, Take 2 tablets (650 mg total) by mouth every 6 (six) hours as needed for mild pain (or Fever >/= 101)., Disp: 100 tablet, Rfl: 2   albuterol (VENTOLIN HFA) 108 (90 Base) MCG/ACT inhaler, Inhale 2 puffs into the lungs every 6 (six) hours as needed for wheezing or shortness of breath., Disp: 18 g, Rfl: 2   apixaban (ELIQUIS) 2.5 MG TABS tablet, Take 1 tablet (2.5 mg total) by mouth 2 (two) times daily., Disp: 60 tablet, Rfl: 4   atorvastatin (LIPITOR) 20 MG tablet, Take 1 tablet (20 mg total) by mouth daily., Disp: 90 tablet, Rfl: 3   bisoprolol (ZEBETA) 5 MG tablet, Take 0.5 tablets (2.5 mg total) by mouth daily., Disp: 45 tablet, Rfl: 4   Calcium Carbonate-Vitamin D (CALCIUM 600 + D PO), Take 1 tablet by mouth 2 (two) times daily., Disp: , Rfl:    dapagliflozin propanediol (FARXIGA) 10 MG TABS tablet, Take 1 tablet (10 mg total) by mouth daily before breakfast., Disp: 30 tablet, Rfl: 6   dofetilide (TIKOSYN) 125 MCG capsule, Take 1 capsule (125 mcg total) by mouth 2 (two) times daily., Disp: 180 capsule, Rfl: 2   Fluticasone-Umeclidin-Vilant (TRELEGY ELLIPTA) 100-62.5-25 MCG/ACT AEPB, Inhale 1 puff into the lungs daily., Disp: 60 each, Rfl: 6   folic acid (FOLVITE) 1 MG tablet, Take 1 tablet (1 mg total) by mouth daily., Disp: 30 tablet, Rfl: 3   losartan (COZAAR) 50 MG tablet, Take 1 tablet  (50 mg total) by mouth daily., Disp: 90 tablet, Rfl: 3   Polyethyl Glycol-Propyl Glycol (SYSTANE OP), Place 1 drop into both eyes daily as needed (Dry eyes)., Disp: , Rfl:    polyethylene glycol (MIRALAX / GLYCOLAX) 17 g packet, Take 17 g by mouth daily as needed for mild constipation., Disp: 14 each, Rfl: 0   potassium chloride SA (KLOR-CON M20) 20 MEQ tablet, Take 1 tablet (20 mEq total) by mouth 2 (two) times daily. (Patient taking differently: Take 40 mEq by mouth 2 (two) times daily.), Disp: 60 tablet, Rfl: 6   torsemide (DEMADEX) 100 MG tablet, Take 100 mg by mouth daily., Disp: , Rfl:    torsemide (DEMADEX) 20 MG tablet, Take 40 mg by mouth every evening. In addition to 100 mg in the morning., Disp: , Rfl:    triamcinolone cream (KENALOG) 0.1 %, Apply 1 application. topically daily as needed (itching)., Disp: , Rfl:   Past Medical History: Past Medical History:  Diagnosis Date   Anxiety    Asthma    Chronic atrial fibrillation (HCC)    Chronic back pain    Chronic diastolic heart failure (HCC)    Chronic obstructive pulmonary disease, unspecified (HCC)    Complete heart block (HCC)    COPD (chronic obstructive pulmonary disease) (HCC)    Coronary atherosclerosis of native coronary artery    a. s/p CABG in 1998 with LIMA-LAD, SVG-Diagonal, SVG-RI-OM b. DES to PLA in 2006  DDD (degenerative disc disease), lumbar    Essential hypertension    Headache    ICD (implantable cardioverter-defibrillator) in place    Mixed hyperlipidemia    Osteoarthritis    Ventricular fibrillation (HCC) 2003   a. seen on PPM interrogation a/w syncope    Tobacco Use: Social History   Tobacco Use  Smoking Status Never  Smokeless Tobacco Never    Labs: Review Flowsheet  More data may exist      Latest Ref Rng & Units 03/08/2007 03/23/2008 08/29/2009 01/24/2013 06/14/2022  Labs for ITP Cardiac and Pulmonary Rehab  Cholestrol 0 - 200 mg/dL 409  811  914  782  -  LDL (calc) 0 - 99 mg/dL 58  56  58   56  -  HDL-C >39 mg/dL 95.6  21.3  36  31  -  Trlycerides <150 mg/dL 086  578  469  629  -  Bicarbonate 20.0 - 28.0 mmol/L - - - - 34.2   O2 Saturation % - - - - 53.0     Details            Capillary Blood Glucose: No results found for: "GLUCAP"   Pulmonary Assessment Scores:  Pulmonary Assessment Scores     Row Name 12/17/22 1456         ADL UCSD   ADL Phase Entry     SOB Score total 75     Rest 0     Walk 3     Stairs 5     Bath 3     Dress 3     Shop 4       CAT Score   CAT Score 26       mMRC Score   mMRC Score 3             UCSD: Self-administered rating of dyspnea associated with activities of daily living (ADLs) 6-point scale (0 = "not at all" to 5 = "maximal or unable to do because of breathlessness")  Scoring Scores range from 0 to 120.  Minimally important difference is 5 units  CAT: CAT can identify the health impairment of COPD patients and is better correlated with disease progression.  CAT has a scoring range of zero to 40. The CAT score is classified into four groups of low (less than 10), medium (10 - 20), high (21-30) and very high (31-40) based on the impact level of disease on health status. A CAT score over 10 suggests significant symptoms.  A worsening CAT score could be explained by an exacerbation, poor medication adherence, poor inhaler technique, or progression of COPD or comorbid conditions.  CAT MCID is 2 points  mMRC: mMRC (Modified Medical Research Council) Dyspnea Scale is used to assess the degree of baseline functional disability in patients of respiratory disease due to dyspnea. No minimal important difference is established. A decrease in score of 1 point or greater is considered a positive change.   Pulmonary Function Assessment:   Exercise Target Goals: Exercise Program Goal: Individual exercise prescription set using results from initial 6 min walk test and THRR while considering  patient's activity barriers and  safety.   Exercise Prescription Goal: Initial exercise prescription builds to 30-45 minutes a day of aerobic activity, 2-3 days per week.  Home exercise guidelines will be given to patient during program as part of exercise prescription that the participant will acknowledge.  Activity Barriers & Risk Stratification:  Activity Barriers & Cardiac Risk Stratification -  12/17/22 1524       Activity Barriers & Cardiac Risk Stratification   Activity Barriers Deconditioning;Muscular Weakness;Back Problems;Arthritis;Shortness of Breath;Assistive Device;Balance Concerns             6 Minute Walk:  6 Minute Walk     Row Name 12/17/22 1516         6 Minute Walk   Phase Initial     Distance 244 feet     Walk Time 2.83 minutes     # of Rest Breaks 1  2:10     MPH 0.97     RPE 15     Perceived Dyspnea  3     Symptoms Yes (comment)     Comments SOB     Resting HR 63 bpm     Resting BP 92/48     Resting Oxygen Saturation  92 %     Exercise Oxygen Saturation  during 6 min walk 84 %     Max Ex. HR 87 bpm     Max Ex. BP 138/82     2 Minute Post BP 134/70       Interval HR   1 Minute HR 66     2 Minute HR 68     3 Minute HR 67     4 Minute HR 63     5 Minute HR 63     6 Minute HR 87     2 Minute Post HR 64     Interval Heart Rate? Yes       Interval Oxygen   Interval Oxygen? Yes     Baseline Oxygen Saturation % 92 %     1 Minute Oxygen Saturation % 87 %     1 Minute Liters of Oxygen 3 L  pulsed     2 Minute Oxygen Saturation % 84 %     2 Minute Liters of Oxygen 3 L     3 Minute Oxygen Saturation % 91 %     3 Minute Liters of Oxygen 3 L     4 Minute Oxygen Saturation % 92 %     4 Minute Liters of Oxygen 3 L     5 Minute Oxygen Saturation % 93 %     5 Minute Liters of Oxygen 3 L     6 Minute Oxygen Saturation % 92 %     6 Minute Liters of Oxygen 3 L     2 Minute Post Oxygen Saturation % 87 %     2 Minute Post Liters of Oxygen 3 L              Oxygen Initial  Assessment:  Oxygen Initial Assessment - 12/17/22 1455       Home Oxygen   Home Oxygen Device Home Concentrator;E-Tanks    Sleep Oxygen Prescription Continuous    Liters per minute 2    Home Resting Oxygen Prescription Continuous    Liters per minute 2    Compliance with Home Oxygen Use Yes      Intervention   Short Term Goals To learn and exhibit compliance with exercise, home and travel O2 prescription;To learn and understand importance of monitoring SPO2 with pulse oximeter and demonstrate accurate use of the pulse oximeter.;To learn and understand importance of maintaining oxygen saturations>88%;To learn and demonstrate proper pursed lip breathing techniques or other breathing techniques. ;To learn and demonstrate proper use of respiratory medications    Long  Term Goals Exhibits compliance with  exercise, home  and travel O2 prescription;Verbalizes importance of monitoring SPO2 with pulse oximeter and return demonstration;Maintenance of O2 saturations>88%;Exhibits proper breathing techniques, such as pursed lip breathing or other method taught during program session;Compliance with respiratory medication;Demonstrates proper use of MDI's             Oxygen Re-Evaluation:  Oxygen Re-Evaluation     Row Name 12/24/22 1350 01/19/23 1348           Program Oxygen Prescription   Program Oxygen Prescription -- Continuous      Liters per minute -- 3        Home Oxygen   Home Oxygen Device -- Home Concentrator;E-Tanks      Sleep Oxygen Prescription -- Continuous      Liters per minute -- 2      Home Exercise Oxygen Prescription -- Continuous      Liters per minute -- 3      Home Resting Oxygen Prescription -- Continuous      Liters per minute -- 2      Compliance with Home Oxygen Use -- Yes        Goals/Expected Outcomes   Short Term Goals To learn and demonstrate proper pursed lip breathing techniques or other breathing techniques.  To learn and demonstrate proper pursed lip  breathing techniques or other breathing techniques.       Long  Term Goals Exhibits proper breathing techniques, such as pursed lip breathing or other method taught during program session Exhibits proper breathing techniques, such as pursed lip breathing or other method taught during program session      Comments Reviewed PLB technique with pt.  Talked about how it works and it's importance in maintaining their exercise saturations. [name] [name] stated that she does have to take breaks when walking due to her SOB. She said that since starting she has not noticed improvement in her SOB.      Goals/Expected Outcomes Short: Become more profiecient at using PLB.   Long: Become independent at using PLB. Short: continue to use PLB for SOB   long term:continue to exericse for imrpovment and compliance with oxygen               Oxygen Discharge (Final Oxygen Re-Evaluation):  Oxygen Re-Evaluation - 01/19/23 1348       Program Oxygen Prescription   Program Oxygen Prescription Continuous    Liters per minute 3      Home Oxygen   Home Oxygen Device Home Concentrator;E-Tanks    Sleep Oxygen Prescription Continuous    Liters per minute 2    Home Exercise Oxygen Prescription Continuous    Liters per minute 3    Home Resting Oxygen Prescription Continuous    Liters per minute 2    Compliance with Home Oxygen Use Yes      Goals/Expected Outcomes   Short Term Goals To learn and demonstrate proper pursed lip breathing techniques or other breathing techniques.     Long  Term Goals Exhibits proper breathing techniques, such as pursed lip breathing or other method taught during program session    Comments [place] stated that she does have to take breaks when walking due to her SOB. She said that since starting she has not noticed improvement in her SOB.    Goals/Expected Outcomes Short: continue to use PLB for SOB   long term:continue to exericse for imrpovment and compliance with oxygen  Initial Exercise Prescription:  Initial Exercise Prescription - 12/17/22 1500       Date of Initial Exercise RX and Referring Provider   Date 12/17/22    Referring Provider Wallie Char MD      Oxygen   Oxygen Continuous    Liters 3    Maintain Oxygen Saturation 88% or higher      NuStep   Level 1    SPM 80    Minutes 30    METs 1.5      Prescription Details   Frequency (times per week) 2    Duration Progress to 30 minutes of continuous aerobic without signs/symptoms of physical distress      Intensity   THRR 40-80% of Max Heartrate 90-118    Ratings of Perceived Exertion 11-13    Perceived Dyspnea 0-4      Progression   Progression Continue to progress workloads to maintain intensity without signs/symptoms of physical distress.      Resistance Training   Training Prescription Yes    Weight 2 lb    Reps 10-15             Perform Capillary Blood Glucose checks as needed.  Exercise Prescription Changes:   Exercise Prescription Changes     Row Name 12/17/22 1500 12/24/22 1400 01/19/23 1300         Response to Exercise   Blood Pressure (Admit) 92/48 106/50 92/60     Blood Pressure (Exercise) 134/82 110/60 --     Blood Pressure (Exit) 134/70 106/62 124/64     Heart Rate (Admit) 63 bpm 66 bpm 68 bpm     Heart Rate (Exercise) 87 bpm 69 bpm 98 bpm     Heart Rate (Exit) 64 bpm 65 bpm 91 bpm     Oxygen Saturation (Admit) 92 % 94 % 94 %     Oxygen Saturation (Exercise) 84 % 95 % 88 %     Oxygen Saturation (Exit) 87 % 98 % 98 %     Rating of Perceived Exertion (Exercise) 15 13 12      Perceived Dyspnea (Exercise) 3 1 2      Symptoms SOB, fatigue -- --     Comments walk test results -- --     Duration -- Continue with 30 min of aerobic exercise without signs/symptoms of physical distress. Continue with 30 min of aerobic exercise without signs/symptoms of physical distress.     Intensity -- THRR unchanged THRR unchanged       Progression   Progression --  Continue to progress workloads to maintain intensity without signs/symptoms of physical distress. Continue to progress workloads to maintain intensity without signs/symptoms of physical distress.       Resistance Training   Training Prescription -- Yes Yes     Weight -- 2 2 lbs / yellow band     Reps -- 10-15 10-15       Oxygen   Oxygen -- Continuous Continuous     Liters -- 3 3       NuStep   Level -- 1 2     SPM -- 48 49     Minutes -- 30 30     METs -- 1.6 1.6       Oxygen   Maintain Oxygen Saturation -- 88% or higher 88% or higher              Exercise Comments:   Exercise Comments     Row Name 12/24/22 1349  Exercise Comments First full day of exercise!  Patient was oriented to gym and equipment including functions, settings, policies, and procedures.  Patient's individual exercise prescription and treatment plan were reviewed.  All starting workloads were established based on the results of the 6 minute walk test done at initial orientation visit.  The plan for exercise progression was also introduced and progression will be customized based on patient's performance and goals.                Exercise Goals and Review:   Exercise Goals     Row Name 12/17/22 1526             Exercise Goals   Increase Physical Activity Yes       Intervention Provide advice, education, support and counseling about physical activity/exercise needs.;Develop an individualized exercise prescription for aerobic and resistive training based on initial evaluation findings, risk stratification, comorbidities and participant's personal goals.       Expected Outcomes Short Term: Attend rehab on a regular basis to increase amount of physical activity.;Long Term: Exercising regularly at least 3-5 days a week.;Long Term: Add in home exercise to make exercise part of routine and to increase amount of physical activity.       Increase Strength and Stamina Yes       Intervention  Provide advice, education, support and counseling about physical activity/exercise needs.;Develop an individualized exercise prescription for aerobic and resistive training based on initial evaluation findings, risk stratification, comorbidities and participant's personal goals.       Expected Outcomes Short Term: Increase workloads from initial exercise prescription for resistance, speed, and METs.;Short Term: Perform resistance training exercises routinely during rehab and add in resistance training at home;Long Term: Improve cardiorespiratory fitness, muscular endurance and strength as measured by increased METs and functional capacity ( )       Able to understand and use rate of perceived exertion (RPE) scale Yes       Intervention Provide education and explanation on how to use RPE scale       Expected Outcomes Short Term: Able to use RPE daily in rehab to express subjective intensity level;Long Term:  Able to use RPE to guide intensity level when exercising independently       Able to understand and use Dyspnea scale Yes       Intervention Provide education and explanation on how to use Dyspnea scale       Expected Outcomes Short Term: Able to use Dyspnea scale daily in rehab to express subjective sense of shortness of breath during exertion;Long Term: Able to use Dyspnea scale to guide intensity level when exercising independently       Knowledge and understanding of Target Heart Rate Range (THRR) Yes       Intervention Provide education and explanation of THRR including how the numbers were predicted and where they are located for reference       Expected Outcomes Short Term: Able to state/look up THRR;Short Term: Able to use daily as guideline for intensity in rehab;Long Term: Able to use THRR to govern intensity when exercising independently       Able to check pulse independently Yes       Intervention Provide education and demonstration on how to check pulse in carotid and radial  arteries.;Review the importance of being able to check your own pulse for safety during independent exercise       Expected Outcomes Short Term: Able to explain why pulse checking is  important during independent exercise;Long Term: Able to check pulse independently and accurately       Understanding of Exercise Prescription Yes       Intervention Provide education, explanation, and written materials on patient's individual exercise prescription       Expected Outcomes Short Term: Able to explain program exercise prescription;Long Term: Able to explain home exercise prescription to exercise independently                Exercise Goals Re-Evaluation :  Exercise Goals Re-Evaluation     Row Name 12/24/22 1349 01/19/23 1331 01/20/23 1327         Exercise Goal Re-Evaluation   Exercise Goals Review Able to understand and use rate of perceived exertion (RPE) scale;Knowledge and understanding of Target Heart Rate Range (THRR);Understanding of Exercise Prescription;Able to understand and use Dyspnea scale Increase Physical Activity;Increase Strength and Stamina Increase Physical Activity;Increase Strength and Stamina;Understanding of Exercise Prescription     Comments Reviewed RPE  and dyspnea scale, THR and program prescription with pt today.  Pt voiced understanding and was given a copy of goals to take home. [name] [name] has been doing well in rehab. At the start she did not think she was going to be able to do rehab but she is proud that she has been able to do the NuStep. She said that she has not noticed an increase in her energy level or SOB. She continues to do well on the nustep and is on level 2. [name] [name] is doing great in rehab and tolerating exercise well. She has increased her level on the NuStep to level 2. She is exercising at an RPE of 12. Will continue to monitor and progress as able.     Expected Outcomes Short: Use RPE daily to regulate intensity.  Long: Follow program prescription in THR. Short:  continue to increase levels when able    long term: continue to attend rehab Short: increase to level 3 for one half in the next two weeks    long term: continue to monitor and progress as able,              Discharge Exercise Prescription (Final Exercise Prescription Changes):  Exercise Prescription Changes - 01/19/23 1300       Response to Exercise   Blood Pressure (Admit) 92/60    Blood Pressure (Exit) 124/64    Heart Rate (Admit) 68 bpm    Heart Rate (Exercise) 98 bpm    Heart Rate (Exit) 91 bpm    Oxygen Saturation (Admit) 94 %    Oxygen Saturation (Exercise) 88 %    Oxygen Saturation (Exit) 98 %    Rating of Perceived Exertion (Exercise) 12    Perceived Dyspnea (Exercise) 2    Duration Continue with 30 min of aerobic exercise without signs/symptoms of physical distress.    Intensity THRR unchanged      Progression   Progression Continue to progress workloads to maintain intensity without signs/symptoms of physical distress.      Resistance Training   Training Prescription Yes    Weight 2 lbs / yellow band    Reps 10-15      Oxygen   Oxygen Continuous    Liters 3      NuStep   Level 2    SPM 49    Minutes 30    METs 1.6      Oxygen   Maintain Oxygen Saturation 88% or higher  Nutrition:  Target Goals: Understanding of nutrition guidelines, daily intake of sodium 1500mg , cholesterol 200mg , calories 30% from fat and 7% or less from saturated fats, daily to have 5 or more servings of fruits and vegetables.  Biometrics:  Pre Biometrics - 12/17/22 1526       Pre Biometrics   Height 5' (1.524 m)    Weight 156 lb 8 oz (71 kg)    Waist Circumference 39 inches    Hip Circumference 42.5 inches    Waist to Hip Ratio 0.92 %    BMI (Calculated) 30.56    Grip Strength 14.6 kg              Nutrition Therapy Plan and Nutrition Goals:   Nutrition Assessments:  MEDIFICTS Score Key: >=70 Need to make dietary changes  40-70 Heart Healthy  Diet <= 40 Therapeutic Level Cholesterol Diet  Flowsheet Row PULMONARY REHAB OTHER RESP ORIENTATION from 12/17/2022 in [facility] CARDIAC REHABILITATION  Picture Your Plate Total Score on Admission 28      Picture Your Plate Scores: <16 Unhealthy dietary pattern with much room for improvement. 41-50 Dietary pattern unlikely to meet recommendations for good health and room for improvement. 51-60 More healthful dietary pattern, with some room for improvement.  >60 Healthy dietary pattern, although there may be some specific behaviors that could be improved.    Nutrition Goals Re-Evaluation:  Nutrition Goals Re-Evaluation     Row Name 01/19/23 1342             Goals   Nutrition Goal healthy eatting       Comment [name] [name] said that she does not have a big appite. She is at [facility] so she eats their dinning food. She said they have a lot of veggies and chicken. They give her a lot of food and she isnt able to eat it all. She also said that it is sometimes to salty and she cant eat things too salty. She does not eat a lot of sweets but will eat the ones the [facility] has with dinner.       Expected Outcome Short : coninue to eat adiquet amout of foods    long term: continue to choise healthy eating                Nutrition Goals Discharge (Final Nutrition Goals Re-Evaluation):  Nutrition Goals Re-Evaluation - 01/19/23 1342       Goals   Nutrition Goal healthy eatting    Comment [name] [name] said that she does not have a big appite. She is at [facility] so she eats their dinning food. She said they have a lot of veggies and chicken. They give her a lot of food and she isnt able to eat it all. She also said that it is sometimes to salty and she cant eat things too salty. She does not eat a lot of sweets but will eat the ones the [facility] has with dinner.    Expected Outcome Short : coninue to eat adiquet amout of foods    long term: continue to choise healthy eating              Psychosocial: Target Goals: Acknowledge presence or absence of significant depression and/or stress, maximize coping skills, provide positive support system. Participant is able to verbalize types and ability to use techniques and skills needed for reducing stress and depression.  Initial Review & Psychosocial Screening:  Initial Psych Review & Screening - 12/17/22 1500  Initial Review   Current issues with Current Depression;Current Stress Concerns;Current Sleep Concerns    Source of Stress Concerns Unable to participate in former interests or hobbies;Chronic Illness      Family Dynamics   Good Support System? Yes   Patient's son and daughter are her support people.     Barriers   Psychosocial barriers to participate in program The patient should benefit from training in stress management and relaxation.      Screening Interventions   Interventions Encouraged to exercise;To provide support and resources with identified psychosocial needs;Provide feedback about the scores to participant    Expected Outcomes Short Term goal: Utilizing psychosocial counselor, staff and physician to assist with identification of specific Stressors or current issues interfering with healing process. Setting desired goal for each stressor or current issue identified.;Long Term Goal: Stressors or current issues are controlled or eliminated.;Short Term goal: Identification and review with participant of any Quality of Life or Depression concerns found by scoring the questionnaire.;Long Term goal: The participant improves quality of Life and PHQ9 Scores as seen by post scores and/or verbalization of changes             Quality of Life Scores:  Scores of 19 and below usually indicate a poorer quality of life in these areas.  A difference of  2-3 points is a clinically meaningful difference.  A difference of 2-3 points in the total score of the Quality of Life Index has been associated with significant  improvement in overall quality of life, self-image, physical symptoms, and general health in studies assessing change in quality of life.   PHQ-9: Review Flowsheet       12/17/2022  Depression screen PHQ 2/9  Decreased Interest 3  Down, Depressed, Hopeless 1  PHQ - 2 Score 4  Altered sleeping 3  Tired, decreased energy 3  Change in appetite 2  Feeling bad or failure about yourself  1  Trouble concentrating 3  Moving slowly or fidgety/restless 0  Suicidal thoughts 0  PHQ-9 Score 16  Difficult doing work/chores Somewhat difficult    Details           Interpretation of Total Score  Total Score Depression Severity:  1-4 = Minimal depression, 5-9 = Mild depression, 10-14 = Moderate depression, 15-19 = Moderately severe depression, 20-27 = Severe depression   Psychosocial Evaluation and Intervention:  Psychosocial Evaluation - 12/17/22 1502       Psychosocial Evaluation & Interventions   Interventions Stress management education;Relaxation education;Encouraged to exercise with the program and follow exercise prescription    Comments Patient was referred to PR with pulmonary HTN. She was accompained today by her son and daughter. Her PHQ-9 score was 16 due to sleeping issues caused by nocturia, lack of interest in doing things, poor appepite and lack of energy. She was hospitalized in April with HF exacerbation and was discharged to SNF and then went to an assistive living facility in June. She says she has been down and depressed since moving into [place]. She wants to go back to her home and get back to living independently. She has never been treated for depression or anxiety and says she is not willing to be treated at this time. 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 She is hard of hearting but very alert and orientated. She was discharged from the hospital on O2. She says she has tried outpatient PT and was not able to complete the sessions due to SOB and fatigue  so she is very  skeptical that she will be able to participate in PR but is willing to try since her son and daughter want her to try. They both say she was not using O2 when she attempted PT and they feel she will be able to participate. They will provide transportation or transportation will be provided by [facility]. Her son and daughter say her diuretic was increased  at her last visit to the HF clinic and since this she has been staying in her room not going to activities or to the dining room to eat like she was doing previously. They think this is mainly due to fear of her having to urinate and soiling herself. The patient says she has to go to the bathroom every 15 minutes and this is why she does not leave her room. She does wear a pull-up in case she can't make it to the bathroom. She says her goals for the program are to breathe better and feel better overall. Her lack of motivation could be a barrier for her to participate in the program.    Expected Outcomes Short term: Attend sessions. Long Term: Breathe better and feel better overall.    Continue Psychosocial Services  Follow up required by staff             Psychosocial Re-Evaluation:  Psychosocial Re-Evaluation     Row Name 01/19/23 1334             Psychosocial Re-Evaluation   Current issues with History of Depression;Current Stress Concerns       Comments [name] [name] stated that she is down about still living at [facility]. She is wanting to get back to living at her own home. She stated that her goals is to get home. She said that she did live by helself. She also stated that [facility] has been a nice place and everyone there has been treating her nicely. She is attending resident event       Expected Outcomes Short term: continue to attned resident events and get to know people    long term: continue to attned rehab and exercise to get to personal goals       Interventions Stress management education;Relaxation education       Continue  Psychosocial Services  Follow up required by staff                Psychosocial Discharge (Final Psychosocial Re-Evaluation):  Psychosocial Re-Evaluation - 01/19/23 1334       Psychosocial Re-Evaluation   Current issues with History of Depression;Current Stress Concerns    Comments [name] [name] stated that she is down about still living at [place]. She is wanting to get back to living at her own home. She stated that her goals is to get home. She said that she did live by helself. She also stated that [facility] has been a nice place and everyone there has been treating her nicely. She is attending resident event    Expected Outcomes Short term: continue to attned resident events and get to know people    long term: continue to attned rehab and exercise to get to personal goals    Interventions Stress management education;Relaxation education    Continue Psychosocial Services  Follow up required by staff              Education: Education Goals: Education classes will be provided on a weekly basis, covering required topics. Participant will state understanding/return demonstration of topics presented.  Learning Barriers/Preferences:  Learning Barriers/Preferences - 12/17/22 1458       Learning Barriers/Preferences   Learning Barriers Hearing    Learning Preferences Written Material;Audio;Skilled Demonstration             Education Topics: How Lungs Work and Diseases: - Discuss the anatomy of the lungs and diseases that can affect the lungs, such as COPD.   Exercise: -Discuss the importance of exercise, FITT principles of exercise, normal and abnormal responses to exercise, and how to exercise safely.   Environmental Irritants: -Discuss types of environmental irritants and how to limit exposure to environmental irritants.   Meds/Inhalers and oxygen: - Discuss respiratory medications, definition of an inhaler and oxygen, and the proper way to use an inhaler and  oxygen.   Energy Saving Techniques: - Discuss methods to conserve energy and decrease shortness of breath when performing activities of daily living.    Bronchial Hygiene / Breathing Techniques: - Discuss breathing mechanics, pursed-lip breathing technique,  proper posture, effective ways to clear airways, and other functional breathing techniques   Cleaning Equipment: - Provides group verbal and written instruction about the health risks of elevated stress, cause of high stress, and healthy ways to reduce stress.   Nutrition I: Fats: - Discuss the types of cholesterol, what cholesterol does to the body, and how cholesterol levels can be controlled.   Nutrition II: Labels: -Discuss the different components of food labels and how to read food labels.   Respiratory Infections: - Discuss the signs and symptoms of respiratory infections, ways to prevent respiratory infections, and the importance of seeking medical treatment when having a respiratory infection.   Stress I: Signs and Symptoms: - Discuss the causes of stress, how stress may lead to anxiety and depression, and ways to limit stress.   Stress II: Relaxation: -Discuss relaxation techniques to limit stress.   Oxygen for Home/Travel: - Discuss how to prepare for travel when on oxygen and proper ways to transport and store oxygen to ensure safety.   Knowledge Questionnaire Score:  Knowledge Questionnaire Score - 12/17/22 1458       Knowledge Questionnaire Score   Pre Score 16/18             Core Components/Risk Factors/Patient Goals at Admission:  Personal Goals and Risk Factors at Admission - 12/17/22 1459       Core Components/Risk Factors/Patient Goals on Admission    Weight Management Yes;Obesity;Weight Loss    Intervention Weight Management: Develop a combined nutrition and exercise program designed to reach desired caloric intake, while maintaining appropriate intake of nutrient and fiber, sodium and  fats, and appropriate energy expenditure required for the weight goal.;Weight Management: Provide education and appropriate resources to help participant work on and attain dietary goals.;Weight Management/Obesity: Establish reasonable short term and long term weight goals.;Obesity: Provide education and appropriate resources to help participant work on and attain dietary goals.    Admit Weight 156 lb 8 oz (71 kg)    Goal Weight: Short Term 150 lb (68 kg)    Goal Weight: Long Term 150 lb (68 kg)    Expected Outcomes Short Term: Continue to assess and modify interventions until short term weight is achieved;Long Term: Adherence to nutrition and physical activity/exercise program aimed toward attainment of established weight goal;Weight Loss: Understanding of general recommendations for a balanced deficit meal plan, which promotes 1-2 lb weight loss per week and includes a negative energy balance of [phone] kcal/d;Understanding recommendations for meals to include 15-35% energy as  protein, 25-35% energy from fat, 35-60% energy from carbohydrates, less than 200mg  of dietary cholesterol, 20-35 gm of total fiber daily;Understanding of distribution of calorie intake throughout the day with the consumption of 4-5 meals/snacks    Improve shortness of breath with ADL's Yes    Intervention Provide education, individualized exercise plan and daily activity instruction to help decrease symptoms of SOB with activities of daily living.    Expected Outcomes Short Term: Improve cardiorespiratory fitness to achieve a reduction of symptoms when performing ADLs;Long Term: Be able to perform more ADLs without symptoms or delay the onset of symptoms    Increase knowledge of respiratory medications and ability to use respiratory devices properly  Yes    Intervention Provide education and demonstration as needed of appropriate use of medications, inhalers, and oxygen therapy.    Expected Outcomes Short Term: Achieves  understanding of medications use. Understands that oxygen is a medication prescribed by physician. Demonstrates appropriate use of inhaler and oxygen therapy.;Long Term: Maintain appropriate use of medications, inhalers, and oxygen therapy.    Heart Failure Yes    Intervention Provide a combined exercise and nutrition program that is supplemented with education, support and counseling about heart failure. Directed toward relieving symptoms such as shortness of breath, decreased exercise tolerance, and extremity edema.    Expected Outcomes Improve functional capacity of life;Short term: Attendance in program 2-3 days a week with increased exercise capacity. Reported lower sodium intake. Reported increased fruit and vegetable intake. Reports medication compliance.;Short term: Daily weights obtained and reported for increase. Utilizing diuretic protocols set by physician.;Long term: Adoption of self-care skills and reduction of barriers for early signs and symptoms recognition and intervention leading to self-care maintenance.    Hypertension Yes    Intervention Monitor prescription use compliance.;Provide education on lifestyle modifcations including regular physical activity/exercise, weight management, moderate sodium restriction and increased consumption of fresh fruit, vegetables, and low fat dairy, alcohol moderation, and smoking cessation.    Expected Outcomes Short Term: Continued assessment and intervention until BP is < 140/80mm HG in hypertensive participants. < 130/71mm HG in hypertensive participants with diabetes, heart failure or chronic kidney disease.;Long Term: Maintenance of blood pressure at goal levels.    Lipids Yes    Intervention Provide education and support for participant on nutrition & aerobic/resistive exercise along with prescribed medications to achieve LDL 70mg , HDL >40mg .    Expected Outcomes Short Term: Participant states understanding of desired cholesterol values and is  compliant with medications prescribed. Participant is following exercise prescription and nutrition guidelines.;Long Term: Cholesterol controlled with medications as prescribed, with individualized exercise RX and with personalized nutrition plan. Value goals: LDL < 70mg , HDL > 40 mg.             Core Components/Risk Factors/Patient Goals Review:   Goals and Risk Factor Review     Row Name 01/19/23 1346             Core Components/Risk Factors/Patient Goals Review   Personal Goals Review Improve shortness of breath with ADL's;Weight Management/Obesity       Review Tawny has been working on her weight managment but she does not eat much due to low appite. She does choose to eat healthy options at the living center. She is wanting to imrpvoe her SOB but has not seen an improvemnt yet. She does walk around [place] with help and attend classes to increase her endurance. She is also remebering to use PLB to help with her SOB when walking.  Expected Outcomes Short term: continue to use PLB for SOB   long term: continue to exericse for mantaince and improving endurance                Core Components/Risk Factors/Patient Goals at Discharge (Final Review):   Goals and Risk Factor Review - 01/19/23 1346       Core Components/Risk Factors/Patient Goals Review   Personal Goals Review Improve shortness of breath with ADL's;Weight Management/Obesity    Review [name] [name] has been working on her weight managment but she does not eat much due to low appite. She does choose to eat healthy options at the living center. She is wanting to imrpvoe her SOB but has not seen an improvemnt yet. She does walk around [place] with help and attend classes to increase her endurance. She is also remebering to use PLB to help with her SOB when walking.    Expected Outcomes Short term: continue to use PLB for SOB   long term: continue to exericse for mantaince and improving endurance             ITP  Comments:  ITP Comments     Row Name 12/24/22 1349 12/30/22 1518 01/27/23 1633       ITP Comments First full day of exercise!  Patient was oriented to gym and equipment including functions, settings, policies, and procedures.  Patient's individual exercise prescription and treatment plan were reviewed.  All starting workloads were established based on the results of the 6 minute walk test done at initial orientation visit.  The plan for exercise progression was also introduced and progression will be customized based on patient's performance and goals. 30 day review completed. ITP sent to Dr.Jehanzeb Memon, Medical Director of  Pulmonary Rehab. Continue with ITP unless changes are made by physician.  New to program 30 day review completed. ITP sent to Dr.Jehanzeb Memon, Medical Director of  Pulmonary Rehab. Continue with ITP unless changes are made by physician.              Comments: 30 day review

## 2023-01-28 ENCOUNTER — Encounter (HOSPITAL_COMMUNITY)
Admission: RE | Admit: 2023-01-28 | Discharge: 2023-01-28 | Disposition: A | Discharge status: Home / Self Care | Payer: Medicare PPO | Source: Ambulatory Visit | Attending: Internal Medicine | Admitting: Internal Medicine

## 2023-01-28 DIAGNOSIS — I272 Pulmonary hypertension, unspecified: Secondary | ICD-10-CM | POA: Diagnosis not present

## 2023-01-28 NOTE — Progress Notes (Signed)
Daily Session Note  Patient Details  Name: [name] [name] MRN: [id number] Date of Birth: 23-Sep-1931 Referring Provider:   Flowsheet Row PULMONARY REHAB OTHER RESP ORIENTATION from 12/17/2022 in [facility] CARDIAC REHABILITATION  Referring Provider Wallie Char MD       Encounter Date: 01/28/2023  Check In:   Capillary Blood Glucose: No results found for this or any previous visit (from the past 24 hour(s)).    Social History   Tobacco Use  Smoking Status Never  Smokeless Tobacco Never    Goals Met:  Independence with exercise equipment Using PLB without cueing & demonstrates good technique Exercise tolerated well Queuing for purse lip breathing No report of concerns or symptoms today Strength training completed today  Goals Unmet:  Not Applicable  Comments: Pt able to follow exercise prescription today without complaint.  Will continue to monitor for progression.

## 2023-02-02 ENCOUNTER — Encounter (HOSPITAL_COMMUNITY)
Admission: RE | Admit: 2023-02-02 | Discharge: 2023-02-02 | Disposition: A | Discharge status: Home / Self Care | Payer: Medicare PPO | Source: Ambulatory Visit | Attending: Internal Medicine | Admitting: Internal Medicine

## 2023-02-02 DIAGNOSIS — I272 Pulmonary hypertension, unspecified: Secondary | ICD-10-CM

## 2023-02-02 NOTE — Progress Notes (Signed)
Daily Session Note  Patient Details  Name: [name] [name] MRN: [id number] Date of Birth: 1932/02/20 Referring Provider:   Flowsheet Row PULMONARY REHAB OTHER RESP ORIENTATION from 12/17/2022 in [facility] CARDIAC REHABILITATION  Referring Provider Wallie Char MD       Encounter Date: 02/02/2023  Check In:  Session Check In - 02/02/23 1330       Check-In   Supervising physician immediately available to respond to emergencies See telemetry face sheet for immediately available MD    Location [facility]    Staff Present Ross Ludwig, BS, Exercise Physiologist;Christy Randa Evens, RN, BSN;Other    Virtual Visit No    Medication changes reported     No    Fall or balance concerns reported    Yes    Comments walks with a rollator and the pt is taking Demadex-she has slipped one time in the past year    Tobacco Cessation No Change    Warm-up and Cool-down Performed on first and last piece of equipment    Resistance Training Performed Yes    VAD Patient? No    PAD/SET Patient? No      Pain Assessment   Currently in Pain? No/denies    Multiple Pain Sites No             Capillary Blood Glucose: No results found for this or any previous visit (from the past 24 hour(s)).    Social History   Tobacco Use  Smoking Status Never  Smokeless Tobacco Never    Goals Met:  Independence with exercise equipment Using PLB without cueing & demonstrates good technique Personal goals reviewed No report of concerns or symptoms today Strength training completed today  Goals Unmet:  Not Applicable  Comments: Pt able to follow exercise prescription today without complaint.  Will continue to monitor for progression.

## 2023-02-04 ENCOUNTER — Encounter (HOSPITAL_COMMUNITY): Payer: Medicare PPO

## 2023-02-05 ENCOUNTER — Ambulatory Visit (INDEPENDENT_AMBULATORY_CARE_PROVIDER_SITE_OTHER): Payer: Medicare PPO

## 2023-02-05 DIAGNOSIS — I4729 Other ventricular tachycardia: Secondary | ICD-10-CM

## 2023-02-06 DIAGNOSIS — R0609 Other forms of dyspnea: Secondary | ICD-10-CM | POA: Diagnosis not present

## 2023-02-06 DIAGNOSIS — R0602 Shortness of breath: Secondary | ICD-10-CM | POA: Diagnosis not present

## 2023-02-06 LAB — CUP PACEART REMOTE DEVICE CHECK
Battery Remaining Longevity: 63 mo
Battery Voltage: 2.97 V
Brady Statistic AP VP Percent: 90.21 %
Brady Statistic AP VS Percent: 0.01 %
Brady Statistic AS VP Percent: 9.77 %
Brady Statistic AS VS Percent: 0 %
Brady Statistic RA Percent Paced: 89.29 %
Brady Statistic RV Percent Paced: 99.92 %
Date Time Interrogation Session: 20241018063326
HighPow Impedance: 52 Ohm
HighPow Impedance: 68 Ohm
Implantable Lead Connection Status: 753985
Implantable Lead Connection Status: 753985
Implantable Lead Implant Date: 20030823
Implantable Lead Implant Date: 20030823
Implantable Lead Location: 753859
Implantable Lead Location: 753860
Implantable Lead Model: 157
Implantable Lead Model: 4086
Implantable Lead Serial Number: 108215
Implantable Lead Serial Number: 112190
Implantable Pulse Generator Implant Date: 20230419
Lead Channel Impedance Value: 323 Ohm
Lead Channel Impedance Value: 323 Ohm
Lead Channel Impedance Value: 380 Ohm
Lead Channel Pacing Threshold Amplitude: 1.375 V
Lead Channel Pacing Threshold Pulse Width: 0.4 ms
Lead Channel Sensing Intrinsic Amplitude: 0.75 mV
Lead Channel Sensing Intrinsic Amplitude: 0.75 mV
Lead Channel Sensing Intrinsic Amplitude: 15.25 mV
Lead Channel Sensing Intrinsic Amplitude: 15.25 mV
Lead Channel Setting Pacing Amplitude: 2 V
Lead Channel Setting Pacing Amplitude: 2.75 V
Lead Channel Setting Pacing Pulse Width: 0.4 ms
Lead Channel Setting Sensing Sensitivity: 0.3 mV
Zone Setting Status: 755011

## 2023-02-09 ENCOUNTER — Encounter (HOSPITAL_COMMUNITY)
Admission: RE | Admit: 2023-02-09 | Discharge: 2023-02-09 | Disposition: A | Discharge status: Home / Self Care | Payer: Medicare PPO | Source: Ambulatory Visit | Attending: Internal Medicine | Admitting: Internal Medicine

## 2023-02-09 DIAGNOSIS — I272 Pulmonary hypertension, unspecified: Secondary | ICD-10-CM | POA: Diagnosis not present

## 2023-02-09 NOTE — Progress Notes (Signed)
Daily Session Note  Patient Details  Name: MILEVA KILDOW MRN: [id number] Date of Birth: [birth year] Referring Provider:   Flowsheet Row PULMONARY REHAB OTHER RESP ORIENTATION from 12/17/2022 in [facility] CARDIAC REHABILITATION  Referring Provider Wallie Char MD       Encounter Date: 02/09/2023  Check In:  Session Check In - 02/09/23 1330       Check-In   Supervising physician immediately available to respond to emergencies See telemetry face sheet for immediately available MD    Location [facility]    Staff Present Ross Ludwig, BS, Exercise Physiologist;Danila Eddie, RN;Jessica Brandon, MA, RCEP, CCRP, CCET    Virtual Visit No    Medication changes reported     No    Fall or balance concerns reported    No    Tobacco Cessation No Change    Warm-up and Cool-down Performed on first and last piece of equipment    Resistance Training Performed Yes    VAD Patient? No    PAD/SET Patient? No      Pain Assessment   Currently in Pain? No/denies    Multiple Pain Sites No             Capillary Blood Glucose: No results found. However, due to the size of the patient record, not all encounters were searched. Please check Results Review for a complete set of results.    Social History   Tobacco Use  Smoking Status Never  Smokeless Tobacco Never    Goals Met:  Proper associated with RPD/PD & O2 Sat Independence with exercise equipment Using PLB without cueing & demonstrates good technique Exercise tolerated well No report of concerns or symptoms today Strength training completed today  Goals Unmet:  Not Applicable  Comments: Pt able to follow exercise prescription today without complaint.  Will continue to monitor for progression.

## 2023-02-09 NOTE — Progress Notes (Signed)
Reviewed home exercise with pt today.  Pt plans to walk at home for exercise.  Reviewed THR, pulse, RPE, sign and symptoms, pulse oximetery and when to call 911 or MD.  Also discussed weather considerations and indoor options.  Pt voiced understanding.

## 2023-02-11 ENCOUNTER — Encounter (HOSPITAL_COMMUNITY)
Admission: RE | Admit: 2023-02-11 | Discharge: 2023-02-11 | Disposition: A | Discharge status: Home / Self Care | Payer: Medicare PPO | Source: Ambulatory Visit | Attending: Internal Medicine | Admitting: Internal Medicine

## 2023-02-11 DIAGNOSIS — I272 Pulmonary hypertension, unspecified: Secondary | ICD-10-CM

## 2023-02-11 NOTE — Progress Notes (Signed)
Daily Session Note  Patient Details  Name: ARAEYA DUFFELL MRN: [id number] Date of Birth: 03/28/1932 Referring Provider:   Flowsheet Row PULMONARY REHAB OTHER RESP ORIENTATION from 12/17/2022 in [facility] CARDIAC REHABILITATION  Referring Provider Wallie Char MD       Encounter Date: 02/11/2023  Check In:  Session Check In - 02/11/23 1330       Check-In   Supervising physician immediately available to respond to emergencies See telemetry face sheet for immediately available MD    Location [facility]    Staff Present Ross Ludwig, BS, Exercise Physiologist;Jessica Juanetta Gosling, MA, RCEP, CCRP, CCET    Virtual Visit No    Medication changes reported     No    Fall or balance concerns reported    No    Tobacco Cessation No Change    Warm-up and Cool-down Performed on first and last piece of equipment    Resistance Training Performed Yes    VAD Patient? No    PAD/SET Patient? No      Pain Assessment   Currently in Pain? No/denies    Multiple Pain Sites No             Capillary Blood Glucose: No results found. However, due to the size of the patient record, not all encounters were searched. Please check Results Review for a complete set of results.    Social History   Tobacco Use  Smoking Status Never  Smokeless Tobacco Never    Goals Met:  Independence with exercise equipment Using PLB without cueing & demonstrates good technique Exercise tolerated well No report of concerns or symptoms today Strength training completed today  Goals Unmet:  Not Applicable  Comments: Pt able to follow exercise prescription today without complaint.  Will continue to monitor for progression.

## 2023-02-15 DIAGNOSIS — J9610 Chronic respiratory failure, unspecified whether with hypoxia or hypercapnia: Secondary | ICD-10-CM | POA: Diagnosis not present

## 2023-02-15 DIAGNOSIS — J449 Chronic obstructive pulmonary disease, unspecified: Secondary | ICD-10-CM | POA: Diagnosis not present

## 2023-02-15 DIAGNOSIS — I5033 Acute on chronic diastolic (congestive) heart failure: Secondary | ICD-10-CM | POA: Diagnosis not present

## 2023-02-16 ENCOUNTER — Encounter (HOSPITAL_COMMUNITY)
Admission: RE | Admit: 2023-02-16 | Discharge: 2023-02-16 | Disposition: A | Discharge status: Home / Self Care | Payer: Medicare PPO | Source: Ambulatory Visit | Attending: Internal Medicine | Admitting: Internal Medicine

## 2023-02-16 DIAGNOSIS — I272 Pulmonary hypertension, unspecified: Secondary | ICD-10-CM | POA: Diagnosis not present

## 2023-02-16 NOTE — Progress Notes (Signed)
Daily Session Note  Patient Details  Name: [name] [name] MRN: [id number] Date of Birth: 1931/08/31 Referring Provider:   Flowsheet Row PULMONARY REHAB OTHER RESP ORIENTATION from 12/17/2022 in [facility] CARDIAC REHABILITATION  Referring Provider Wallie Char MD       Encounter Date: 02/16/2023  Check In:  Session Check In - 02/16/23 1315       Check-In   Supervising physician immediately available to respond to emergencies See telemetry face sheet for immediately available MD    Location [facility]    Staff Present Ross Ludwig, BS, Exercise Physiologist;Kymberlee Viger Leonidas Romberg BSN, RN    Virtual Visit No    Medication changes reported     No    Fall or balance concerns reported    No    Tobacco Cessation No Change    Warm-up and Cool-down Performed on first and last piece of equipment    Resistance Training Performed Yes    VAD Patient? No    PAD/SET Patient? No      Pain Assessment   Currently in Pain? No/denies    Multiple Pain Sites No             Capillary Blood Glucose: No results found. However, due to the size of the patient record, not all encounters were searched. Please check Results Review for a complete set of results.    Social History   Tobacco Use  Smoking Status Never  Smokeless Tobacco Never    Goals Met:  Proper associated with RPD/PD & O2 Sat Independence with exercise equipment Using PLB without cueing & demonstrates good technique Exercise tolerated well Queuing for purse lip breathing No report of concerns or symptoms today Strength training completed today  Goals Unmet:  Not Applicable  Comments: Marland KitchenMarland KitchenPt able to follow exercise prescription today without complaint.  Will continue to monitor for progression.

## 2023-02-18 ENCOUNTER — Encounter (HOSPITAL_COMMUNITY)
Admission: RE | Admit: 2023-02-18 | Discharge: 2023-02-18 | Disposition: A | Discharge status: Home / Self Care | Payer: Medicare PPO | Source: Ambulatory Visit | Attending: Internal Medicine | Admitting: Internal Medicine

## 2023-02-18 DIAGNOSIS — I272 Pulmonary hypertension, unspecified: Secondary | ICD-10-CM

## 2023-02-18 NOTE — Progress Notes (Signed)
Daily Session Note  Patient Details  Name: [name] [name] MRN: [id number] Date of Birth: 1932/01/05 Referring Provider:   Flowsheet Row PULMONARY REHAB OTHER RESP ORIENTATION from 12/17/2022 in [facility] CARDIAC REHABILITATION  Referring Provider Wallie Char MD       Encounter Date: 02/18/2023  Check In:  Session Check In - 02/18/23 1330       Check-In   Supervising physician immediately available to respond to emergencies See telemetry face sheet for immediately available MD    Location [facility]    Staff Present Ross Ludwig, BS, Exercise Physiologist;Jessica Juanetta Gosling, MA, RCEP, CCRP, CCET;Hillary Troutman BSN, RN    Virtual Visit No    Medication changes reported     No    Fall or balance concerns reported    No    Tobacco Cessation No Change    Warm-up and Cool-down Performed on first and last piece of equipment    Resistance Training Performed Yes    VAD Patient? No    PAD/SET Patient? No      Pain Assessment   Currently in Pain? No/denies    Multiple Pain Sites No             Capillary Blood Glucose: No results found. However, due to the size of the patient record, not all encounters were searched. Please check Results Review for a complete set of results.    Social History   Tobacco Use  Smoking Status Never  Smokeless Tobacco Never    Goals Met:  Independence with exercise equipment Using PLB without cueing & demonstrates good technique Exercise tolerated well No report of concerns or symptoms today Strength training completed today  Goals Unmet:  Not Applicable  Comments: Pt able to follow exercise prescription today without complaint.  Will continue to monitor for progression.

## 2023-02-23 ENCOUNTER — Encounter (HOSPITAL_COMMUNITY)
Admission: RE | Admit: 2023-02-23 | Discharge: 2023-02-23 | Disposition: A | Discharge status: Home / Self Care | Payer: Medicare PPO | Source: Ambulatory Visit | Attending: Internal Medicine | Admitting: Internal Medicine

## 2023-02-23 DIAGNOSIS — I272 Pulmonary hypertension, unspecified: Secondary | ICD-10-CM | POA: Diagnosis not present

## 2023-02-23 NOTE — Progress Notes (Signed)
Daily Session Note  Patient Details  Name: [name] [name] MRN: [id number] Date of Birth: November 08, 1931 Referring Provider:   Flowsheet Row PULMONARY REHAB OTHER RESP ORIENTATION from 12/17/2022 in [facility] CARDIAC REHABILITATION  Referring Provider Wallie Char MD       Encounter Date: 02/23/2023  Check In:  Session Check In - 02/23/23 1330       Check-In   Supervising physician immediately available to respond to emergencies See telemetry face sheet for immediately available MD    Location [facility]    Staff Present Ross Ludwig, BS, Exercise Physiologist;Danny Gala Romney, RN, BSN;Dossie Swor, RN;Jessica Essex, MA, RCEP, CCRP, CCET    Virtual Visit No    Medication changes reported     No    Fall or balance concerns reported    No    Tobacco Cessation No Change    Warm-up and Cool-down Performed on first and last piece of equipment    Resistance Training Performed Yes    VAD Patient? No    PAD/SET Patient? No      Pain Assessment   Currently in Pain? No/denies    Multiple Pain Sites No             Capillary Blood Glucose: No results found. However, due to the size of the patient record, not all encounters were searched. Please check Results Review for a complete set of results.    Social History   Tobacco Use  Smoking Status Never  Smokeless Tobacco Never    Goals Met:  Proper associated with RPD/PD & O2 Sat Independence with exercise equipment Improved SOB with ADL's Exercise tolerated well No report of concerns or symptoms today Strength training completed today  Goals Unmet:  Not Applicable  Comments: Pt able to follow exercise prescription today without complaint.  Will continue to monitor for progression.    Dr. Mechele Collin is Medical Director for Pulmonary Rehab at [facility].

## 2023-02-23 NOTE — Progress Notes (Signed)
Remote ICD transmission.   

## 2023-02-24 ENCOUNTER — Encounter (HOSPITAL_COMMUNITY): Payer: Self-pay | Admitting: *Deleted

## 2023-02-24 DIAGNOSIS — I272 Pulmonary hypertension, unspecified: Secondary | ICD-10-CM

## 2023-02-24 NOTE — Progress Notes (Signed)
Pulmonary Individual Treatment Plan  Patient Details  Name: [name] [name] MRN: [id number] Date of Birth: 1931/09/25 Referring Provider:   Flowsheet Row PULMONARY REHAB OTHER RESP ORIENTATION from 12/17/2022 in [facility] CARDIAC REHABILITATION  Referring Provider Wallie Char MD       Initial Encounter Date:  Flowsheet Row PULMONARY REHAB OTHER RESP ORIENTATION from 12/17/2022 in [place] CARDIAC REHABILITATION  Date 12/17/22       Visit Diagnosis: Pulmonary hypertension (HCC)  Patient's Home Medications on Admission:   Current Outpatient Medications:    acetaminophen (TYLENOL) 325 MG tablet, Take 2 tablets (650 mg total) by mouth every 6 (six) hours as needed for mild pain (or Fever >/= 101)., Disp: 100 tablet, Rfl: 2   albuterol (VENTOLIN HFA) 108 (90 Base) MCG/ACT inhaler, Inhale 2 puffs into the lungs every 6 (six) hours as needed for wheezing or shortness of breath., Disp: 18 g, Rfl: 2   apixaban (ELIQUIS) 2.5 MG TABS tablet, Take 1 tablet (2.5 mg total) by mouth 2 (two) times daily., Disp: 60 tablet, Rfl: 4   atorvastatin (LIPITOR) 20 MG tablet, Take 1 tablet (20 mg total) by mouth daily., Disp: 90 tablet, Rfl: 3   bisoprolol (ZEBETA) 5 MG tablet, Take 0.5 tablets (2.5 mg total) by mouth daily., Disp: 45 tablet, Rfl: 4   Calcium Carbonate-Vitamin D (CALCIUM 600 + D PO), Take 1 tablet by mouth 2 (two) times daily., Disp: , Rfl:    dapagliflozin propanediol (FARXIGA) 10 MG TABS tablet, Take 1 tablet (10 mg total) by mouth daily before breakfast., Disp: 30 tablet, Rfl: 6   dofetilide (TIKOSYN) 125 MCG capsule, Take 1 capsule (125 mcg total) by mouth 2 (two) times daily., Disp: 180 capsule, Rfl: 2   Fluticasone-Umeclidin-Vilant (TRELEGY ELLIPTA) 100-62.5-25 MCG/ACT AEPB, Inhale 1 puff into the lungs daily., Disp: 60 each, Rfl: 6   folic acid (FOLVITE) 1 MG tablet, Take 1 tablet (1 mg total) by mouth daily., Disp: 30 tablet, Rfl: 3   losartan (COZAAR) 50 MG tablet, Take 1 tablet  (50 mg total) by mouth daily., Disp: 90 tablet, Rfl: 3   Polyethyl Glycol-Propyl Glycol (SYSTANE OP), Place 1 drop into both eyes daily as needed (Dry eyes)., Disp: , Rfl:    polyethylene glycol (MIRALAX / GLYCOLAX) 17 g packet, Take 17 g by mouth daily as needed for mild constipation., Disp: 14 each, Rfl: 0   potassium chloride SA (KLOR-CON M20) 20 MEQ tablet, Take 1 tablet (20 mEq total) by mouth 2 (two) times daily. (Patient taking differently: Take 40 mEq by mouth 2 (two) times daily.), Disp: 60 tablet, Rfl: 6   torsemide (DEMADEX) 100 MG tablet, Take 100 mg by mouth daily., Disp: , Rfl:    torsemide (DEMADEX) 20 MG tablet, Take 40 mg by mouth every evening. In addition to 100 mg in the morning., Disp: , Rfl:    triamcinolone cream (KENALOG) 0.1 %, Apply 1 application. topically daily as needed (itching)., Disp: , Rfl:   Past Medical History: Past Medical History:  Diagnosis Date   Anxiety    Asthma    Chronic atrial fibrillation (HCC)    Chronic back pain    Chronic diastolic heart failure (HCC)    Chronic obstructive pulmonary disease, unspecified (HCC)    Complete heart block (HCC)    COPD (chronic obstructive pulmonary disease) (HCC)    Coronary atherosclerosis of native coronary artery    a. s/p CABG in 1998 with LIMA-LAD, SVG-Diagonal, SVG-RI-OM b. DES to PLA in 2006  DDD (degenerative disc disease), lumbar    Essential hypertension    Headache    ICD (implantable cardioverter-defibrillator) in place    Mixed hyperlipidemia    Osteoarthritis    Ventricular fibrillation (HCC) 2003   a. seen on PPM interrogation a/w syncope    Tobacco Use: Social History   Tobacco Use  Smoking Status Never  Smokeless Tobacco Never    Labs: Review Flowsheet  More data may exist      Latest Ref Rng & Units 03/08/2007 03/23/2008 08/29/2009 01/24/2013 06/14/2022  Labs for ITP Cardiac and Pulmonary Rehab  Cholestrol 0 - 200 mg/dL 409  811  914  782  -  LDL (calc) 0 - 99 mg/dL 58  56  58   56  -  HDL-C >39 mg/dL 95.6  21.3  36  31  -  Trlycerides <150 mg/dL 086  578  469  629  -  Bicarbonate 20.0 - 28.0 mmol/L - - - - 34.2   O2 Saturation % - - - - 53.0     Details            Capillary Blood Glucose: No results found for: "GLUCAP"   Pulmonary Assessment Scores:  Pulmonary Assessment Scores     Row Name 12/17/22 1456         ADL UCSD   ADL Phase Entry     SOB Score total 75     Rest 0     Walk 3     Stairs 5     Bath 3     Dress 3     Shop 4       CAT Score   CAT Score 26       mMRC Score   mMRC Score 3             UCSD: Self-administered rating of dyspnea associated with activities of daily living (ADLs) 6-point scale (0 = "not at all" to 5 = "maximal or unable to do because of breathlessness")  Scoring Scores range from 0 to 120.  Minimally important difference is 5 units  CAT: CAT can identify the health impairment of COPD patients and is better correlated with disease progression.  CAT has a scoring range of zero to 40. The CAT score is classified into four groups of low (less than 10), medium (10 - 20), high (21-30) and very high (31-40) based on the impact level of disease on health status. A CAT score over 10 suggests significant symptoms.  A worsening CAT score could be explained by an exacerbation, poor medication adherence, poor inhaler technique, or progression of COPD or comorbid conditions.  CAT MCID is 2 points  mMRC: mMRC (Modified Medical Research Council) Dyspnea Scale is used to assess the degree of baseline functional disability in patients of respiratory disease due to dyspnea. No minimal important difference is established. A decrease in score of 1 point or greater is considered a positive change.   Pulmonary Function Assessment:   Exercise Target Goals: Exercise Program Goal: Individual exercise prescription set using results from initial 6 min walk test and THRR while considering  patient's activity barriers and  safety.   Exercise Prescription Goal: Initial exercise prescription builds to 30-45 minutes a day of aerobic activity, 2-3 days per week.  Home exercise guidelines will be given to patient during program as part of exercise prescription that the participant will acknowledge.  Activity Barriers & Risk Stratification:  Activity Barriers & Cardiac Risk Stratification -  12/17/22 1524       Activity Barriers & Cardiac Risk Stratification   Activity Barriers Deconditioning;Muscular Weakness;Back Problems;Arthritis;Shortness of Breath;Assistive Device;Balance Concerns             6 Minute Walk:  6 Minute Walk     Row Name 12/17/22 1516         6 Minute Walk   Phase Initial     Distance 244 feet     Walk Time 2.83 minutes     # of Rest Breaks 1  2:10     MPH 0.97     RPE 15     Perceived Dyspnea  3     Symptoms Yes (comment)     Comments SOB     Resting HR 63 bpm     Resting BP 92/48     Resting Oxygen Saturation  92 %     Exercise Oxygen Saturation  during 6 min walk 84 %     Max Ex. HR 87 bpm     Max Ex. BP 138/82     2 Minute Post BP 134/70       Interval HR   1 Minute HR 66     2 Minute HR 68     3 Minute HR 67     4 Minute HR 63     5 Minute HR 63     6 Minute HR 87     2 Minute Post HR 64     Interval Heart Rate? Yes       Interval Oxygen   Interval Oxygen? Yes     Baseline Oxygen Saturation % 92 %     1 Minute Oxygen Saturation % 87 %     1 Minute Liters of Oxygen 3 L  pulsed     2 Minute Oxygen Saturation % 84 %     2 Minute Liters of Oxygen 3 L     3 Minute Oxygen Saturation % 91 %     3 Minute Liters of Oxygen 3 L     4 Minute Oxygen Saturation % 92 %     4 Minute Liters of Oxygen 3 L     5 Minute Oxygen Saturation % 93 %     5 Minute Liters of Oxygen 3 L     6 Minute Oxygen Saturation % 92 %     6 Minute Liters of Oxygen 3 L     2 Minute Post Oxygen Saturation % 87 %     2 Minute Post Liters of Oxygen 3 L              Oxygen Initial  Assessment:  Oxygen Initial Assessment - 12/17/22 1455       Home Oxygen   Home Oxygen Device Home Concentrator;E-Tanks    Sleep Oxygen Prescription Continuous    Liters per minute 2    Home Resting Oxygen Prescription Continuous    Liters per minute 2    Compliance with Home Oxygen Use Yes      Intervention   Short Term Goals To learn and exhibit compliance with exercise, home and travel O2 prescription;To learn and understand importance of monitoring SPO2 with pulse oximeter and demonstrate accurate use of the pulse oximeter.;To learn and understand importance of maintaining oxygen saturations>88%;To learn and demonstrate proper pursed lip breathing techniques or other breathing techniques. ;To learn and demonstrate proper use of respiratory medications    Long  Term Goals Exhibits compliance with  exercise, home  and travel O2 prescription;Verbalizes importance of monitoring SPO2 with pulse oximeter and return demonstration;Maintenance of O2 saturations>88%;Exhibits proper breathing techniques, such as pursed lip breathing or other method taught during program session;Compliance with respiratory medication;Demonstrates proper use of MDI's             Oxygen Re-Evaluation:  Oxygen Re-Evaluation     Row Name 12/24/22 1350 01/19/23 1348 02/09/23 1401         Program Oxygen Prescription   Program Oxygen Prescription -- Continuous Continuous     Liters per minute -- 3 3       Home Oxygen   Home Oxygen Device -- Home Concentrator;E-Tanks Home Concentrator;E-Tanks     Sleep Oxygen Prescription -- Continuous Continuous     Liters per minute -- 2 2     Home Exercise Oxygen Prescription -- Continuous Continuous     Liters per minute -- 3 3     Home Resting Oxygen Prescription -- Continuous Continuous     Liters per minute -- 2 2     Compliance with Home Oxygen Use -- Yes Yes       Goals/Expected Outcomes   Short Term Goals To learn and demonstrate proper pursed lip breathing  techniques or other breathing techniques.  To learn and demonstrate proper pursed lip breathing techniques or other breathing techniques.  To learn and demonstrate proper pursed lip breathing techniques or other breathing techniques. ;To learn and understand importance of maintaining oxygen saturations>88%;To learn and understand importance of monitoring SPO2 with pulse oximeter and demonstrate accurate use of the pulse oximeter.;To learn and exhibit compliance with exercise, home and travel O2 prescription;To learn and demonstrate proper use of respiratory medications     Long  Term Goals Exhibits proper breathing techniques, such as pursed lip breathing or other method taught during program session Exhibits proper breathing techniques, such as pursed lip breathing or other method taught during program session Exhibits compliance with exercise, home  and travel O2 prescription;Verbalizes importance of monitoring SPO2 with pulse oximeter and return demonstration;Exhibits proper breathing techniques, such as pursed lip breathing or other method taught during program session;Maintenance of O2 saturations>88%;Compliance with respiratory medication;Demonstrates proper use of MDI's     Comments Reviewed PLB technique with pt.  Talked about how it works and it's importance in maintaining their exercise saturations. [name] [name] stated that she does have to take breaks when walking due to her SOB. She said that since starting she has not noticed improvement in her SOB. [name] [name] is doing well in rehab. She is good about wearing her oxygen and finds it helpful.  However, she has noted that sometimes when she wakes up at night it has come off and she will put it back on.  She is doing well with her inhalers.  She is still working on her PLB and finds it helpful and tries to remember to do it.     Goals/Expected Outcomes Short: Become more profiecient at using PLB.   Long: Become independent at using PLB. Short: continue to use PLB  for SOB   long term:continue to exericse for imrpovment and compliance with oxygen Short: Conitue to work on PLB at home Long: Conitnue to work on breathing              Oxygen Discharge (Final Oxygen Re-Evaluation):  Oxygen Re-Evaluation - 02/09/23 1401       Program Oxygen Prescription   Program Oxygen Prescription Continuous    Liters per minute 3  Home Oxygen   Home Oxygen Device Home Concentrator;E-Tanks    Sleep Oxygen Prescription Continuous    Liters per minute 2    Home Exercise Oxygen Prescription Continuous    Liters per minute 3    Home Resting Oxygen Prescription Continuous    Liters per minute 2    Compliance with Home Oxygen Use Yes      Goals/Expected Outcomes   Short Term Goals To learn and demonstrate proper pursed lip breathing techniques or other breathing techniques. ;To learn and understand importance of maintaining oxygen saturations>88%;To learn and understand importance of monitoring SPO2 with pulse oximeter and demonstrate accurate use of the pulse oximeter.;To learn and exhibit compliance with exercise, home and travel O2 prescription;To learn and demonstrate proper use of respiratory medications    Long  Term Goals Exhibits compliance with exercise, home  and travel O2 prescription;Verbalizes importance of monitoring SPO2 with pulse oximeter and return demonstration;Exhibits proper breathing techniques, such as pursed lip breathing or other method taught during program session;Maintenance of O2 saturations>88%;Compliance with respiratory medication;Demonstrates proper use of MDI's    Comments [name] [name] is doing well in rehab. She is good about wearing her oxygen and finds it helpful.  However, she has noted that sometimes when she wakes up at night it has come off and she will put it back on.  She is doing well with her inhalers.  She is still working on her PLB and finds it helpful and tries to remember to do it.    Goals/Expected Outcomes Short: Conitue to  work on PLB at home Long: Conitnue to work on breathing             Initial Exercise Prescription:  Initial Exercise Prescription - 12/17/22 1500       Date of Initial Exercise RX and Referring Provider   Date 12/17/22    Referring Provider Wallie Char MD      Oxygen   Oxygen Continuous    Liters 3    Maintain Oxygen Saturation 88% or higher      NuStep   Level 1    SPM 80    Minutes 30    METs 1.5      Prescription Details   Frequency (times per week) 2    Duration Progress to 30 minutes of continuous aerobic without signs/symptoms of physical distress      Intensity   THRR 40-80% of Max Heartrate 90-118    Ratings of Perceived Exertion 11-13    Perceived Dyspnea 0-4      Progression   Progression Continue to progress workloads to maintain intensity without signs/symptoms of physical distress.      Resistance Training   Training Prescription Yes    Weight 2 lb    Reps 10-15             Perform Capillary Blood Glucose checks as needed.  Exercise Prescription Changes:   Exercise Prescription Changes     Row Name 12/17/22 1500 12/24/22 1400 01/19/23 1300 02/09/23 1400 02/18/23 1500     Response to Exercise   Blood Pressure (Admit) 92/48 106/50 92/60 -- 120/60   Blood Pressure (Exercise) 134/82 110/60 -- -- --   Blood Pressure (Exit) 134/70 106/62 124/64 -- 102/66   Heart Rate (Admit) 63 bpm 66 bpm 68 bpm -- 64 bpm   Heart Rate (Exercise) 87 bpm 69 bpm 98 bpm -- 64 bpm   Heart Rate (Exit) 64 bpm 65 bpm 91 bpm -- 61 bpm  Oxygen Saturation (Admit) 92 % 94 % 94 % -- 98 %   Oxygen Saturation (Exercise) 84 % 95 % 88 % -- 95 %   Oxygen Saturation (Exit) 87 % 98 % 98 % -- 100 %   Rating of Perceived Exertion (Exercise) 15 13 12  -- 12   Perceived Dyspnea (Exercise) 3 1 2  -- 3   Symptoms SOB, fatigue -- -- -- --   Comments walk test results -- -- -- --   Duration -- Continue with 30 min of aerobic exercise without signs/symptoms of physical distress.  Continue with 30 min of aerobic exercise without signs/symptoms of physical distress. -- Continue with 30 min of aerobic exercise without signs/symptoms of physical distress.   Intensity -- THRR unchanged THRR unchanged -- THRR unchanged     Progression   Progression -- Continue to progress workloads to maintain intensity without signs/symptoms of physical distress. Continue to progress workloads to maintain intensity without signs/symptoms of physical distress. -- Continue to progress workloads to maintain intensity without signs/symptoms of physical distress.     Resistance Training   Training Prescription -- Yes Yes -- Yes   Weight -- 2 2 lbs / yellow band -- 2 lbs / yellow   Reps -- 10-15 10-15 -- 10-15     Oxygen   Oxygen -- Continuous Continuous -- Continuous   Liters -- 3 3 -- 3     NuStep   Level -- 1 2 -- 1   SPM -- 48 49 -- 56   Minutes -- 30 30 -- 30   METs -- 1.6 1.6 -- 1.7     Home Exercise Plan   Plans to continue exercise at -- -- -- Home (comment)  walking Home (comment)   Frequency -- -- -- Add 2 additional days to program exercise sessions. Add 2 additional days to program exercise sessions.   Initial Home Exercises Provided -- -- -- 02/09/23 --     Oxygen   Maintain Oxygen Saturation -- 88% or higher 88% or higher -- 88% or higher            Exercise Comments:   Exercise Comments     Row Name 12/24/22 1349           Exercise Comments First full day of exercise!  Patient was oriented to gym and equipment including functions, settings, policies, and procedures.  Patient's individual exercise prescription and treatment plan were reviewed.  All starting workloads were established based on the results of the 6 minute walk test done at initial orientation visit.  The plan for exercise progression was also introduced and progression will be customized based on patient's performance and goals.                Exercise Goals and Review:   Exercise Goals      Row Name 12/17/22 1526             Exercise Goals   Increase Physical Activity Yes       Intervention Provide advice, education, support and counseling about physical activity/exercise needs.;Develop an individualized exercise prescription for aerobic and resistive training based on initial evaluation findings, risk stratification, comorbidities and participant's personal goals.       Expected Outcomes Short Term: Attend rehab on a regular basis to increase amount of physical activity.;Long Term: Exercising regularly at least 3-5 days a week.;Long Term: Add in home exercise to make exercise part of routine and to increase amount of physical activity.  Increase Strength and Stamina Yes       Intervention Provide advice, education, support and counseling about physical activity/exercise needs.;Develop an individualized exercise prescription for aerobic and resistive training based on initial evaluation findings, risk stratification, comorbidities and participant's personal goals.       Expected Outcomes Short Term: Increase workloads from initial exercise prescription for resistance, speed, and METs.;Short Term: Perform resistance training exercises routinely during rehab and add in resistance training at home;Long Term: Improve cardiorespiratory fitness, muscular endurance and strength as measured by increased METs and functional capacity ( )       Able to understand and use rate of perceived exertion (RPE) scale Yes       Intervention Provide education and explanation on how to use RPE scale       Expected Outcomes Short Term: Able to use RPE daily in rehab to express subjective intensity level;Long Term:  Able to use RPE to guide intensity level when exercising independently       Able to understand and use Dyspnea scale Yes       Intervention Provide education and explanation on how to use Dyspnea scale       Expected Outcomes Short Term: Able to use Dyspnea scale daily in rehab to  express subjective sense of shortness of breath during exertion;Long Term: Able to use Dyspnea scale to guide intensity level when exercising independently       Knowledge and understanding of Target Heart Rate Range (THRR) Yes       Intervention Provide education and explanation of THRR including how the numbers were predicted and where they are located for reference       Expected Outcomes Short Term: Able to state/look up THRR;Short Term: Able to use daily as guideline for intensity in rehab;Long Term: Able to use THRR to govern intensity when exercising independently       Able to check pulse independently Yes       Intervention Provide education and demonstration on how to check pulse in carotid and radial arteries.;Review the importance of being able to check your own pulse for safety during independent exercise       Expected Outcomes Short Term: Able to explain why pulse checking is important during independent exercise;Long Term: Able to check pulse independently and accurately       Understanding of Exercise Prescription Yes       Intervention Provide education, explanation, and written materials on patient's individual exercise prescription       Expected Outcomes Short Term: Able to explain program exercise prescription;Long Term: Able to explain home exercise prescription to exercise independently                Exercise Goals Re-Evaluation :  Exercise Goals Re-Evaluation     Row Name 12/24/22 1349 01/19/23 1331 01/20/23 1327 02/09/23 1353       Exercise Goal Re-Evaluation   Exercise Goals Review Able to understand and use rate of perceived exertion (RPE) scale;Knowledge and understanding of Target Heart Rate Range (THRR);Understanding of Exercise Prescription;Able to understand and use Dyspnea scale Increase Physical Activity;Increase Strength and Stamina Increase Physical Activity;Increase Strength and Stamina;Understanding of Exercise Prescription Increase Physical  Activity;Increase Strength and Stamina;Understanding of Exercise Prescription    Comments Reviewed RPE  and dyspnea scale, THR and program prescription with pt today.  Pt voiced understanding and was given a copy of goals to take home. [name] [name] has been doing well in rehab. At the start she did not think she  was going to be able to do rehab but she is proud that she has been able to do the NuStep. She said that she has not noticed an increase in her energy level or SOB. She continues to do well on the nustep and is on level 2. [name] [name] is doing great in rehab and tolerating exercise well. She has increased her level on the NuStep to level 2. She is exercising at an RPE of 12. Will continue to monitor and progress as able. [name] [name] is doing well in rehab.  She has not noticed much improvement in her breathing but she does have more stamina.  She is walking some at home already.  She wants to get better.  We have also noticed that she no longer needs as many rest breaks in rehab. Reviewed home exercise with pt today.  Pt plans to walk at home for exercise.  Reviewed THR, pulse, RPE, sign and symptoms, pulse oximetery and when to call 911 or MD.  Also discussed weather considerations and indoor options.  Pt voiced understanding.    Expected Outcomes Short: Use RPE daily to regulate intensity.  Long: Follow program prescription in THR. Short: continue to increase levels when able    long term: continue to attend rehab Short: increase to level 3 for one half in the next two weeks    long term: continue to monitor and progress as able, Short: Continue to add exercise at home Long: continue to improve stamina             Discharge Exercise Prescription (Final Exercise Prescription Changes):  Exercise Prescription Changes - 02/18/23 1500       Response to Exercise   Blood Pressure (Admit) 120/60    Blood Pressure (Exit) 102/66    Heart Rate (Admit) 64 bpm    Heart Rate (Exercise) 64 bpm    Heart Rate (Exit) 61 bpm     Oxygen Saturation (Admit) 98 %    Oxygen Saturation (Exercise) 95 %    Oxygen Saturation (Exit) 100 %    Rating of Perceived Exertion (Exercise) 12    Perceived Dyspnea (Exercise) 3    Duration Continue with 30 min of aerobic exercise without signs/symptoms of physical distress.    Intensity THRR unchanged      Progression   Progression Continue to progress workloads to maintain intensity without signs/symptoms of physical distress.      Resistance Training   Training Prescription Yes    Weight 2 lbs / yellow    Reps 10-15      Oxygen   Oxygen Continuous    Liters 3      NuStep   Level 1    SPM 56    Minutes 30    METs 1.7      Home Exercise Plan   Plans to continue exercise at Home (comment)    Frequency Add 2 additional days to program exercise sessions.      Oxygen   Maintain Oxygen Saturation 88% or higher             Nutrition:  Target Goals: Understanding of nutrition guidelines, daily intake of sodium 1500mg , cholesterol 200mg , calories 30% from fat and 7% or less from saturated fats, daily to have 5 or more servings of fruits and vegetables.  Biometrics:  Pre Biometrics - 12/17/22 1526       Pre Biometrics   Height 5' (1.524 m)    Weight 156 lb 8 oz (71 kg)  Waist Circumference 39 inches    Hip Circumference 42.5 inches    Waist to Hip Ratio 0.92 %    BMI (Calculated) 30.56    Grip Strength 14.6 kg              Nutrition Therapy Plan and Nutrition Goals:   Nutrition Assessments:  MEDIFICTS Score Key: >=70 Need to make dietary changes  40-70 Heart Healthy Diet <= 40 Therapeutic Level Cholesterol Diet  Flowsheet Row PULMONARY REHAB OTHER RESP ORIENTATION from 12/17/2022 in [facility] CARD[facility]AC [facility]IL[facility]ATION  Picture Your Plate Total Score on Admission 28      Picture Your Plate Scores: <13 Unhealthy dietary pattern with much room for improvement. 41-50 Dietary pattern unlikely to meet recommendations for good health and room  for improvement. 51-60 More healthful dietary pattern, with some room for improvement.  >60 Healthy dietary pattern, although there may be some specific behaviors that could be improved.    Nutrition Goals Re-Evaluation:  Nutrition Goals Re-Evaluation     Row Name 01/19/23 1342 02/09/23 1357           Goals   Nutrition Goal healthy eatting healthy eatting      Comment [name] [name] said that she does not have a big appite. She is at [facility] so she eats their dinning food. She said they have a lot of veggies and chicken. They give her a lot of food and she isnt able to eat it all. She also said that it is sometimes to salty and she cant eat things too salty. She does not eat a lot of sweets but will eat the ones the [facility] has with dinner. [name] [name] is doing well in rehab. She is still not a big eater.  She uses dining hall food at [place] so not able to control all her food.  She does try to avoid things that are too salty and get in fruits and vegetbales.      Expected Outcome Short : coninue to eat adiquet amout of foods    long term: continue to choise healthy eating Short: Continue to eat enough Long: Continue to pick healthy options               Nutrition Goals Discharge (Final Nutrition Goals Re-Evaluation):  Nutrition Goals Re-Evaluation - 02/09/23 1357       Goals   Nutrition Goal healthy eatting    Comment [name] [name] is doing well in rehab. She is still not a big eater.  She uses dining hall food at [place] so not able to control all her food.  She does try to avoid things that are too salty and get in fruits and vegetbales.    Expected Outcome Short: Continue to eat enough Long: Continue to pick healthy options             Psychosocial: Target Goals: Acknowledge presence or absence of significant depression and/or stress, maximize coping skills, provide positive support system. Participant is able to verbalize types and ability to use techniques and skills needed for  reducing stress and depression.  Initial Review & Psychosocial Screening:  Initial Psych Review & Screening - 12/17/22 1500       Initial Review   Current issues with Current Depression;Current Stress Concerns;Current Sleep Concerns    Source of Stress Concerns Unable to participate in former interests or hobbies;Chronic Illness      Family Dynamics   Good Support System? Yes   Patient's son and daughter are her support people.  Barriers   Psychosocial barriers to participate in program The patient should benefit from training in stress management and relaxation.      Screening Interventions   Interventions Encouraged to exercise;To provide support and resources with identified psychosocial needs;Provide feedback about the scores to participant    Expected Outcomes Short Term goal: Utilizing psychosocial counselor, staff and physician to assist with identification of specific Stressors or current issues interfering with healing process. Setting desired goal for each stressor or current issue identified.;Long Term Goal: Stressors or current issues are controlled or eliminated.;Short Term goal: Identification and review with participant of any Quality of Life or Depression concerns found by scoring the questionnaire.;Long Term goal: The participant improves quality of Life and PHQ9 Scores as seen by post scores and/or verbalization of changes             Quality of Life Scores:  Scores of 19 and below usually indicate a poorer quality of life in these areas.  A difference of  2-3 points is a clinically meaningful difference.  A difference of 2-3 points in the total score of the Quality of Life Index has been associated with significant improvement in overall quality of life, self-image, physical symptoms, and general health in studies assessing change in quality of life.   PHQ-9: Review Flowsheet       12/17/2022  Depression screen PHQ 2/9  Decreased Interest 3  Down, Depressed,  Hopeless 1  PHQ - 2 Score 4  Altered sleeping 3  Tired, decreased energy 3  Change in appetite 2  Feeling bad or failure about yourself  1  Trouble concentrating 3  Moving slowly or fidgety/restless 0  Suicidal thoughts 0  PHQ-9 Score 16  Difficult doing work/chores Somewhat difficult    Details           Interpretation of Total Score  Total Score Depression Severity:  1-4 = Minimal depression, 5-9 = Mild depression, 10-14 = Moderate depression, 15-19 = Moderately severe depression, 20-27 = Severe depression   Psychosocial Evaluation and Intervention:  Psychosocial Evaluation - 12/17/22 1502       Psychosocial Evaluation & Interventions   Interventions Stress management education;Relaxation education;Encouraged to exercise with the program and follow exercise prescription    Comments Patient was referred to PR with pulmonary HTN. She was accompained today by her son and daughter. Her PHQ-9 score was 16 due to sleeping issues caused by nocturia, lack of interest in doing things, poor appepite and lack of energy. She was hospitalized in April with HF exacerbation and was discharged to SNF and then went to an assistive living facility in June. She says she has been down and depressed since moving into [place]. She wants to go back to her home and get back to living independently. She has never been treated for depression or anxiety and says she is not willing to be treated at this time. 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 She is hard of hearting but very alert and orientated. She was discharged from the hospital on O2. She says she has tried outpatient PT and was not able to complete the sessions due to SOB and fatigue so she is very skeptical that she will be able to participate in PR but is willing to try since her son and daughter want her to try. They both say she was not using O2 when she attempted PT and they feel she will be able to participate. They will provide transportation or  transportation  will be provided by [facility]. Her son and daughter say her diuretic was increased  at her last visit to the HF clinic and since this she has been staying in her room not going to activities or to the dining room to eat like she was doing previously. They think this is mainly due to fear of her having to urinate and soiling herself. The patient says she has to go to the bathroom every 15 minutes and this is why she does not leave her room. She does wear a pull-up in case she can't make it to the bathroom. She says her goals for the program are to breathe better and feel better overall. Her lack of motivation could be a barrier for her to participate in the program.    Expected Outcomes Short term: Attend sessions. Long Term: Breathe better and feel better overall.    Continue Psychosocial Services  Follow up required by staff             Psychosocial Re-Evaluation:  Psychosocial Re-Evaluation     Row Name 01/19/23 1334 02/09/23 1354           Psychosocial Re-Evaluation   Current issues with History of Depression;Current Stress Concerns History of Depression;Current Stress Concerns;Current Sleep Concerns      Comments [name] [name] stated that she is down about still living at [place]. She is wanting to get back to living at her own home. She stated that her goals is to get home. She said that she did live by helself. She also stated that [facility] been a nice place and everyone there has been treating her nicely. She is attending resident event [name] [name] is doing well in rehab.  She is still at [facility] and really wants to get back home.  They have not told her when she will be able to go home.  She knows she has to build up her independence again.  Her kids do not want her home by herself and she even admits that it would be hard for her to that still.  She still is not sleeping well with frequent trips to the bathroom.  She tries to rest as best as she can.      Expected Outcomes  Short term: continue to attned resident events and get to know people    long term: continue to attned rehab and exercise to get to personal goals Short: Continue to work to build up independence again Long: Continue to exercise for mental boost and talk with kids.      Interventions Stress management education;Relaxation education --      Continue Psychosocial Services  Follow up required by staff --               Psychosocial Discharge (Final Psychosocial Re-Evaluation):  Psychosocial Re-Evaluation - 02/09/23 1354       Psychosocial Re-Evaluation   Current issues with History of Depression;Current Stress Concerns;Current Sleep Concerns    Comments [name] [name] is doing well in rehab.  She is still at [facility] and really wants to get back home.  They have not told her when she will be able to go home.  She knows she has to build up her independence again.  Her kids do not want her home by herself and she even admits that it would be hard for her to that still.  She still is not sleeping well with frequent trips to the bathroom.  She tries to rest as best as she can.  Expected Outcomes Short: Continue to work to build up independence again Long: Continue to exercise for mental boost and talk with kids.              Education: Education Goals: Education classes will be provided on a weekly basis, covering required topics. Participant will state understanding/return demonstration of topics presented.  Learning Barriers/Preferences:  Learning Barriers/Preferences - 12/17/22 1458       Learning Barriers/Preferences   Learning Barriers Hearing    Learning Preferences Written Material;Audio;Skilled Demonstration             Education Topics: How Lungs Work and Diseases: - Discuss the anatomy of the lungs and diseases that can affect the lungs, such as COPD.   Exercise: -Discuss the importance of exercise, FITT principles of exercise, normal and abnormal responses to exercise,  and how to exercise safely.   Environmental Irritants: -Discuss types of environmental irritants and how to limit exposure to environmental irritants.   Meds/Inhalers and oxygen: - Discuss respiratory medications, definition of an inhaler and oxygen, and the proper way to use an inhaler and oxygen.   Energy Saving Techniques: - Discuss methods to conserve energy and decrease shortness of breath when performing activities of daily living.    Bronchial Hygiene / Breathing Techniques: - Discuss breathing mechanics, pursed-lip breathing technique,  proper posture, effective ways to clear airways, and other functional breathing techniques   Cleaning Equipment: - Provides group verbal and written instruction about the health risks of elevated stress, cause of high stress, and healthy ways to reduce stress.   Nutrition I: Fats: - Discuss the types of cholesterol, what cholesterol does to the body, and how cholesterol levels can be controlled.   Nutrition II: Labels: -Discuss the different components of food labels and how to read food labels.   Respiratory Infections: - Discuss the signs and symptoms of respiratory infections, ways to prevent respiratory infections, and the importance of seeking medical treatment when having a respiratory infection.   Stress I: Signs and Symptoms: - Discuss the causes of stress, how stress may lead to anxiety and depression, and ways to limit stress. Flowsheet Row PULMONARY REHAB OTHER RESPIRATORY from 02/18/2023 in [facility] RE[facility]  Date 02/11/23  Educator hj  Instruction Review Code 1- Verbalizes Understanding       Stress II: Relaxation: -Discuss relaxation techniques to limit stress.   Oxygen for Home/Travel: - Discuss how to prepare for travel when on oxygen and proper ways to transport and store oxygen to ensure safety.   Knowledge Questionnaire Score:  Knowledge Questionnaire Score - 12/17/22 1458        Knowledge Questionnaire Score   Pre Score 16/18             Core Components/Risk Factors/Patient Goals at Admission:  Personal Goals and Risk Factors at Admission - 12/17/22 1459       Core Components/Risk Factors/Patient Goals on Admission    Weight Management Yes;Obesity;Weight Loss    Intervention Weight Management: Develop a combined nutrition and exercise program designed to reach desired caloric intake, while maintaining appropriate intake of nutrient and fiber, sodium and fats, and appropriate energy expenditure required for the weight goal.;Weight Management: Provide education and appropriate resources to help participant work on and attain dietary goals.;Weight Management/Obesity: Establish reasonable short term and long term weight goals.;Obesity: Provide education and appropriate resources to help participant work on and attain dietary goals.    Admit Weight 156 lb 8 oz (71 kg)  Goal Weight: Short Term 150 lb (68 kg)    Goal Weight: Long Term 150 lb (68 kg)    Expected Outcomes Short Term: Continue to assess and modify interventions until short term weight is achieved;Long Term: Adherence to nutrition and physical activity/exercise program aimed toward attainment of established weight goal;Weight Loss: Understanding of general recommendations for a balanced deficit meal plan, which promotes 1-2 lb weight loss per week and includes a negative energy balance of [phone] kcal/d;Understanding recommendations for meals to include 15-35% energy as protein, 25-35% energy from fat, 35-60% energy from carbohydrates, less than 200mg  of dietary cholesterol, 20-35 gm of total fiber daily;Understanding of distribution of calorie intake throughout the day with the consumption of 4-5 meals/snacks    Improve shortness of breath with ADL's Yes    Intervention Provide education, individualized exercise plan and daily activity instruction to help decrease symptoms of SOB with activities of daily  living.    Expected Outcomes Short Term: Improve cardiorespiratory fitness to achieve a reduction of symptoms when performing ADLs;Long Term: Be able to perform more ADLs without symptoms or delay the onset of symptoms    Increase knowledge of respiratory medications and ability to use respiratory devices properly  Yes    Intervention Provide education and demonstration as needed of appropriate use of medications, inhalers, and oxygen therapy.    Expected Outcomes Short Term: Achieves understanding of medications use. Understands that oxygen is a medication prescribed by physician. Demonstrates appropriate use of inhaler and oxygen therapy.;Long Term: Maintain appropriate use of medications, inhalers, and oxygen therapy.    Heart Failure Yes    Intervention Provide a combined exercise and nutrition program that is supplemented with education, support and counseling about heart failure. Directed toward relieving symptoms such as shortness of breath, decreased exercise tolerance, and extremity edema.    Expected Outcomes Improve functional capacity of life;Short term: Attendance in program 2-3 days a week with increased exercise capacity. Reported lower sodium intake. Reported increased fruit and vegetable intake. Reports medication compliance.;Short term: Daily weights obtained and reported for increase. Utilizing diuretic protocols set by physician.;Long term: Adoption of self-care skills and reduction of barriers for early signs and symptoms recognition and intervention leading to self-care maintenance.    Hypertension Yes    Intervention Monitor prescription use compliance.;Provide education on lifestyle modifcations including regular physical activity/exercise, weight management, moderate sodium restriction and increased consumption of fresh fruit, vegetables, and low fat dairy, alcohol moderation, and smoking cessation.    Expected Outcomes Short Term: Continued assessment and intervention until BP is  < 140/83mm HG in hypertensive participants. < 130/76mm HG in hypertensive participants with diabetes, heart failure or chronic kidney disease.;Long Term: Maintenance of blood pressure at goal levels.    Lipids Yes    Intervention Provide education and support for participant on nutrition & aerobic/resistive exercise along with prescribed medications to achieve LDL 70mg , HDL >40mg .    Expected Outcomes Short Term: Participant states understanding of desired cholesterol values and is compliant with medications prescribed. Participant is following exercise prescription and nutrition guidelines.;Long Term: Cholesterol controlled with medications as prescribed, with individualized exercise RX and with personalized nutrition plan. Value goals: LDL < 70mg , HDL > 40 mg.             Core Components/Risk Factors/Patient Goals Review:   Goals and Risk Factor Review     Row Name 01/19/23 1346 02/09/23 1358           Core Components/Risk Factors/Patient Goals Review  Personal Goals Review Improve shortness of breath with ADL's;Weight Management/Obesity Improve shortness of breath with ADL's;Weight Management/Obesity;Heart Failure;Hypertension;Increase knowledge of respiratory medications and ability to use respiratory devices properly.      Review [name] [name] has been working on her weight managment but she does not eat much due to low appite. She does choose to eat healthy options at the living center. She is wanting to imrpvoe her SOB but has not seen an improvemnt yet. She does walk around [place] with help and attend classes to increase her endurance. She is also remebering to use PLB to help with her SOB when walking. [name] [name] is doing well in rehab.  She keeps a close eye on her weight and it is staying steady for most part.  She does note that she still is retaining some fluid in her legs.  They have improved some.  She does not want more lasix as she is already constantly going to bathroom.  Her breathing  is still not too much better but feels that she can breathe okay.  Her pressures are still running low for her and she talked to Dr. Diona Browner last week about it.  He told her is she gets symptomatic with it that he would lower her medications.  She wants to get better.      Expected Outcomes Short term: continue to use PLB for SOB   long term: continue to exericse for mantaince and improving endurance Short: Continue to keep close eye on blood pressures Long: Continue to monitor heart failure symptoms               Core Components/Risk Factors/Patient Goals at Discharge (Final Review):   Goals and Risk Factor Review - 02/09/23 1358       Core Components/Risk Factors/Patient Goals Review   Personal Goals Review Improve shortness of breath with ADL's;Weight Management/Obesity;Heart Failure;Hypertension;Increase knowledge of respiratory medications and ability to use respiratory devices properly.    Review [name] [name] is doing well in rehab.  She keeps a close eye on her weight and it is staying steady for most part.  She does note that she still is retaining some fluid in her legs.  They have improved some.  She does not want more lasix as she is already constantly going to bathroom.  Her breathing is still not too much better but feels that she can breathe okay.  Her pressures are still running low for her and she talked to Dr. Diona Browner last week about it.  He told her is she gets symptomatic with it that he would lower her medications.  She wants to get better.    Expected Outcomes Short: Continue to keep close eye on blood pressures Long: Continue to monitor heart failure symptoms             ITP Comments:  ITP Comments     Row Name 12/24/22 1349 12/30/22 1518 01/27/23 1633 02/24/23 0819     ITP Comments First full day of exercise!  Patient was oriented to gym and equipment including functions, settings, policies, and procedures.  Patient's individual exercise prescription and treatment plan  were reviewed.  All starting workloads were established based on the results of the 6 minute walk test done at initial orientation visit.  The plan for exercise progression was also introduced and progression will be customized based on patient's performance and goals. 30 day review completed. ITP sent to Dr.Jehanzeb Memon, Medical Director of  Pulmonary Rehab. Continue with ITP unless changes are made  by physician.  New to program 30 day review completed. ITP sent to Dr.Jehanzeb Memon, Medical Director of  Pulmonary Rehab. Continue with ITP unless changes are made by physician. 30 day review completed. ITP sent to Dr.Jehanzeb Memon, Medical Director of  Pulmonary Rehab. Continue with ITP unless changes are made by physician.             Comments: 30 day review

## 2023-02-25 ENCOUNTER — Encounter (HOSPITAL_COMMUNITY)
Admission: RE | Admit: 2023-02-25 | Discharge: 2023-02-25 | Disposition: A | Discharge status: Home / Self Care | Payer: Medicare PPO | Source: Ambulatory Visit | Attending: Internal Medicine | Admitting: Internal Medicine

## 2023-02-25 DIAGNOSIS — I272 Pulmonary hypertension, unspecified: Secondary | ICD-10-CM

## 2023-02-25 NOTE — Progress Notes (Signed)
Daily Session Note  Patient Details  Name: [name] [name] MRN: [id number] Date of Birth: [birth year] Referring Provider:   Flowsheet Row PULMONARY REHAB OTHER RESP ORIENTATION from 12/17/2022 in [facility] CARDIAC REHABILITATION  Referring Provider Wallie Char MD       Encounter Date: 02/25/2023  Check In:  Session Check In - 02/25/23 1330       Check-In   Supervising physician immediately available to respond to emergencies See telemetry face sheet for immediately available MD    Location [facility]    Staff Present Ross Ludwig, BS, Exercise Physiologist;Jessica Juanetta Gosling, MA, RCEP, CCRP, CCET;Other    Virtual Visit No    Medication changes reported     No    Fall or balance concerns reported    No    Tobacco Cessation No Change    Warm-up and Cool-down Performed on first and last piece of equipment    Resistance Training Performed Yes    VAD Patient? No    PAD/SET Patient? No      Pain Assessment   Currently in Pain? No/denies    Multiple Pain Sites No             Capillary Blood Glucose: No results found. However, due to the size of the patient record, not all encounters were searched. Please check Results Review for a complete set of results.    Social History   Tobacco Use  Smoking Status Never  Smokeless Tobacco Never    Goals Met:  Independence with exercise equipment Using PLB without cueing & demonstrates good technique Exercise tolerated well No report of concerns or symptoms today  Goals Unmet:  Not Applicable  Comments: Pt able to follow exercise prescription today without complaint.  Will continue to monitor for progression.

## 2023-03-02 ENCOUNTER — Encounter (HOSPITAL_COMMUNITY)
Admission: RE | Admit: 2023-03-02 | Discharge: 2023-03-02 | Disposition: A | Discharge status: Home / Self Care | Payer: Medicare PPO | Source: Ambulatory Visit | Attending: Internal Medicine | Admitting: Internal Medicine

## 2023-03-02 DIAGNOSIS — I272 Pulmonary hypertension, unspecified: Secondary | ICD-10-CM | POA: Diagnosis not present

## 2023-03-02 NOTE — Progress Notes (Signed)
Daily Session Note  Patient Details  Name: [name] [name] MRN: [id number] Date of Birth: 1931/11/04 Referring Provider:   Flowsheet Row PULMONARY REHAB OTHER RESP ORIENTATION from 12/17/2022 in [facility] CARDIAC REHABILITATION  Referring Provider Wallie Char MD       Encounter Date: 03/02/2023  Check In:  Session Check In - 03/02/23 1330       Check-In   Supervising physician immediately available to respond to emergencies See telemetry face sheet for immediately available MD    Location [facility]    Staff Present Erskine Speed, RN;Jessica Harper [place], MA, RCEP, CCRP, CCET;Other    Virtual Visit No    Medication changes reported     No    Fall or balance concerns reported    No    Tobacco Cessation No Change    Warm-up and Cool-down Performed on first and last piece of equipment    Resistance Training Performed Yes    VAD Patient? No    PAD/SET Patient? No      Pain Assessment   Currently in Pain? No/denies    Multiple Pain Sites No             Capillary Blood Glucose: No results found. However, due to the size of the patient record, not all encounters were searched. Please check Results Review for a complete set of results.    Social History   Tobacco Use  Smoking Status Never  Smokeless Tobacco Never    Goals Met:  Proper associated with RPD/PD & O2 Sat Independence with exercise equipment Using PLB without cueing & demonstrates good technique Exercise tolerated well No report of concerns or symptoms today Strength training completed today  Goals Unmet:  Not Applicable  Comments: Pt able to follow exercise prescription today without complaint.  Will continue to monitor for progression.

## 2023-03-04 ENCOUNTER — Encounter (HOSPITAL_COMMUNITY): Payer: Medicare PPO

## 2023-03-08 ENCOUNTER — Encounter (HOSPITAL_COMMUNITY): Payer: Medicare PPO | Admitting: Internal Medicine

## 2023-03-09 ENCOUNTER — Inpatient Hospital Stay (HOSPITAL_COMMUNITY)
Admission: RE | Admit: 2023-03-09 | Discharge: 2023-03-09 | Discharge status: Home / Self Care | Payer: Medicare PPO | Source: Ambulatory Visit | Attending: Internal Medicine | Admitting: Internal Medicine

## 2023-03-09 ENCOUNTER — Encounter (HOSPITAL_COMMUNITY): Payer: Medicare PPO

## 2023-03-09 ENCOUNTER — Encounter (HOSPITAL_COMMUNITY): Payer: Self-pay | Admitting: Internal Medicine

## 2023-03-09 VITALS — BP 102/64 | HR 61 | Wt 162.4 lb

## 2023-03-09 DIAGNOSIS — G4733 Obstructive sleep apnea (adult) (pediatric): Secondary | ICD-10-CM | POA: Diagnosis not present

## 2023-03-09 DIAGNOSIS — I5032 Chronic diastolic (congestive) heart failure: Secondary | ICD-10-CM | POA: Diagnosis not present

## 2023-03-09 DIAGNOSIS — R9431 Abnormal electrocardiogram [ECG] [EKG]: Secondary | ICD-10-CM | POA: Insufficient documentation

## 2023-03-09 DIAGNOSIS — R0609 Other forms of dyspnea: Secondary | ICD-10-CM | POA: Diagnosis not present

## 2023-03-09 DIAGNOSIS — R0602 Shortness of breath: Secondary | ICD-10-CM | POA: Diagnosis not present

## 2023-03-09 DIAGNOSIS — I272 Pulmonary hypertension, unspecified: Secondary | ICD-10-CM | POA: Diagnosis not present

## 2023-03-09 LAB — BRAIN NATRIURETIC PEPTIDE: B Natriuretic Peptide: 747.5 pg/mL — ABNORMAL HIGH (ref 0.0–100.0)

## 2023-03-09 LAB — BASIC METABOLIC PANEL
Anion gap: 12 (ref 5–15)
BUN: 60 mg/dL — ABNORMAL HIGH (ref 8–23)
CO2: 25 mmol/L (ref 22–32)
Calcium: 10 mg/dL (ref 8.9–10.3)
Chloride: 99 mmol/L (ref 98–111)
Creatinine, Ser: 2.63 mg/dL — ABNORMAL HIGH (ref 0.44–1.00)
GFR, Estimated: 17 mL/min — ABNORMAL LOW (ref >60)
Glucose, Bld: 96 mg/dL (ref 70–99)
Potassium: 5.8 mmol/L — ABNORMAL HIGH (ref 3.5–5.1)
Sodium: 136 mmol/L (ref 135–145)

## 2023-03-09 MED ORDER — LOSARTAN POTASSIUM 25 MG PO TABS: 25.0000 mg | ORAL_TABLET | Freq: Every day | ORAL | Status: DC

## 2023-03-09 NOTE — Patient Instructions (Addendum)
Medication Changes:  DECREASE LOSARTAN TO 25MG  ONCE DAILY   STOP BISOPROLOL   Lab Work:  Labs done today, your results will be available in MyChart, we will contact you for abnormal readings.  THEN GO IN 1 WEEK FOR REPEAT BLOOD WORK AT [facility] IN [place]   PLEASE REFER TO [facility] FOR BLOOD WORK/URINE TESTING: LOCATIONS LISTED BELOW   Address: [address] [place], [place] [address] Hours:  Open ? Closes 12:30?PM ? Reopens 1:30?PM Updated by this business 7 weeks ago Phone: [phone] Appointments: [email or web address]  Address: [address] [place], [place] [address] Hours:  Open ? Closes 12?PM ? Reopens 1?PM Updated by this business 7 weeks ago Phone: [phone] Appointments: [email or web address]   Follow-Up in: 3 MONTHS PLEASE CALL OUR OFFICE AROUND DECEMBER 2024 TO GET SCHEDULED FOR YOUR APPOINTMENT. PHONE NUMBER IS [phone] OPTION 2   At the [facility], you and your health needs are our priority. We have a designated team specialized in the treatment of Heart Failure. This Care Team includes your primary Heart Failure Specialized Cardiologist (physician), Advanced Practice Providers (APPs- Physician Assistants and Nurse Practitioners), and Pharmacist who all work together to provide you with the care you need, when you need it.   You may see any of the following providers on your designated Care Team at your next follow up:  Dr. Arvilla Meres Dr. Marca Ancona Dr. Dorthula Nettles Dr. Theresia Bough Tonye Becket, NP Robbie Lis, [place] [facility] [place], [place] Brynda Peon, NP [place] Lee, NP Karle Plumber, PharmD   Please be sure to bring in all your medications bottles to every appointment.   Need to Contact us:  If you have any questions or concerns before your next appointment please send [place] a message through [place] or call our office at [phone].    TO LEAVE A MESSAGE FOR THE NURSE SELECT OPTION 2, PLEASE LEAVE A MESSAGE  INCLUDING: YOUR NAME DATE OF BIRTH CALL BACK NUMBER REASON FOR CALL**this is important as we prioritize the call backs  YOU WILL RECEIVE A CALL BACK THE SAME DAY AS LONG AS YOU CALL BEFORE 4:00 PM

## 2023-03-09 NOTE — Progress Notes (Addendum)
ADVANCED HF CLINIC CONSULT NOTE  Referring Physician: [name], MD Primary Care: [name], MD Primary Cardiologist: Nona Dell, MD  HPI:  [name] [name] is a 89 y.o. female (never smoker) with CAD (s/p CABG in 1998 with LIMA-LAD, SVG-Diagonal, SVG-RI-OM, DES to PLA in 2006, low-risk NST in 09/2019), HFpEF, pulmonary HTN, chronic hypoxic respiratory failure/COPD, pPAF, history of VT (s/p ICD placement) and Stage 3 CKD who is referred by Dr. Diona Browner for further evaluation of her pulmonary HTN,    Here with family. Says she was doing pretty well until 6 months ago. Started getting weaker and had no energy. Currently now at [facility]. Working with PT regularly. Walks with walker. Has baseline dyspnea on exertion and remains on supplemental oxygen (3L). SOB with mild activity. + significant LE edema but some what improved recently. No orthopnea or PND. No CP  Echo 3/24: LVEF 50-55%. RV mod HK.RVSP 59. Severe biatrial enlargement. Mod MR. Mod-sev TR  PFTs 2/24 FEV1 0.79 (57%) FVC 1.30 (68%) DLCO 46%  In 5/24 labs had shown her creatinine had increased to 2.26, therefore once weekly Metolazone was discontinued and Torsemide was reduced to 40mg  daily. She had only been taking [place] 5mg  daily and this was titrated to 10mg  daily. Now taking an extra Torsemide 20mg  at times due to weight gain or worsening edema.   Now on torsemide 100/40. Feeling much better. Not very active. SOB with mild activity. No orthopnea or PND. Edema improved but not totally resolved.  Going to pulmonary rehab 2x/week    Past Medical History:  Diagnosis Date   Anxiety    Asthma    Chronic atrial fibrillation (HCC)    Chronic back pain    Chronic diastolic heart failure (HCC)    Chronic obstructive pulmonary disease, unspecified (HCC)    Complete heart block (HCC)    COPD (chronic obstructive pulmonary disease) (HCC)    Coronary atherosclerosis of native coronary artery    a. s/p CABG in 1998 with  LIMA-LAD, SVG-Diagonal, SVG-RI-OM b. DES to PLA in 2006   DDD (degenerative disc disease), lumbar    Essential hypertension    Headache    ICD (implantable cardioverter-defibrillator) in place    Mixed hyperlipidemia    Osteoarthritis    Ventricular fibrillation (HCC) 2003   a. seen on PPM interrogation a/w syncope    Current Outpatient Medications  Medication Sig Dispense Refill   acetaminophen (TYLENOL) 325 MG tablet Take 2 tablets (650 mg total) by mouth every 6 (six) hours as needed for mild pain (or Fever >/= 101). 100 tablet 2   albuterol (VENTOLIN HFA) 108 (90 Base) MCG/ACT inhaler Inhale 2 puffs into the lungs every 6 (six) hours as needed for wheezing or shortness of breath. 18 g 2   apixaban (ELIQUIS) 2.5 MG TABS tablet Take 1 tablet (2.5 mg total) by mouth 2 (two) times daily. 60 tablet 4   atorvastatin (LIPITOR) 20 MG tablet Take 1 tablet (20 mg total) by mouth daily. 90 tablet 3   bisoprolol (ZEBETA) 5 MG tablet Take 0.5 tablets (2.5 mg total) by mouth daily. 45 tablet 4   Calcium Carbonate-Vitamin D (CALCIUM 600 + D PO) Take 1 tablet by mouth 2 (two) times daily.     dapagliflozin propanediol (FARXIGA) 10 MG TABS tablet Take 1 tablet (10 mg total) by mouth daily before breakfast. 30 tablet 6   dofetilide (TIKOSYN) 125 MCG capsule Take 1 capsule (125 mcg total) by mouth 2 (two) times daily.  180 capsule 2   Fluticasone-Umeclidin-Vilant (TRELEGY ELLIPTA) 100-62.5-25 MCG/ACT AEPB Inhale 1 puff into the lungs daily. 60 each 6   folic acid (FOLVITE) 1 MG tablet Take 1 tablet (1 mg total) by mouth daily. 30 tablet 3   Polyethyl Glycol-Propyl Glycol (SYSTANE OP) Place 1 drop into both eyes daily as needed (Dry eyes).     polyethylene glycol (MIRALAX / GLYCOLAX) 17 g packet Take 17 g by mouth daily as needed for mild constipation. 14 each 0   torsemide (DEMADEX) 100 MG tablet Take 100 mg by mouth daily.     torsemide (DEMADEX) 20 MG tablet Take 40 mg by mouth every evening. In addition  to 100 mg in the morning.     triamcinolone cream (KENALOG) 0.1 % Apply 1 application. topically daily as needed (itching).     losartan (COZAAR) 50 MG tablet Take 1 tablet (50 mg total) by mouth daily. 90 tablet 3   potassium chloride SA (KLOR-CON M20) 20 MEQ tablet Take 1 tablet (20 mEq total) by mouth 2 (two) times daily. (Patient taking differently: Take 40 mEq by mouth 2 (two) times daily.) 60 tablet 6   No current facility-administered medications for this encounter.    Allergies  Allergen Reactions   Codeine Nausea And Vomiting   Fentanyl Itching    Itching, redness to scalp and neck   Keflex [Cephalexin] Itching and Rash      Social History   Socioeconomic History   Marital status: Widowed    Spouse name: Not on file   Number of children: 2   Years of education: Not on file   Highest education level: Not on file  Occupational History   Occupation: Retired    Comment: Copywriter, advertising: RETIRED  Tobacco Use   Smoking status: Never   Smokeless tobacco: Never  Vaping Use   Vaping status: Never Used  Substance and Sexual Activity   Alcohol use: No    Alcohol/week: 0.0 standard drinks of alcohol   Drug use: No   Sexual activity: Never  Other Topics Concern   Not on file  Social History Narrative   Not on file   Social Determinants of Health   Financial Resource Strain: Not on file  Food Insecurity: No Food Insecurity (08/18/2022)   Hunger Vital Sign    Worried About Running Out of Food in the Last Year: Never true    Ran Out of Food in the Last Year: Never true  Transportation Needs: No Transportation Needs (08/18/2022)   PRAPARE - Administrator, Civil Service (Medical): No    Lack of Transportation (Non-Medical): No  Physical Activity: Not on file  Stress: Not on file  Social Connections: Not on file  Intimate Partner Violence: Not At Risk (08/18/2022)   Humiliation, Afraid, Rape, and Kick questionnaire    Fear of Current or  Ex-Partner: No    Emotionally Abused: No    Physically Abused: No    Sexually Abused: No      Family History  Problem Relation Age of Onset   Heart attack Father    Heart attack Brother     Vitals:   03/09/23 1507  BP: 102/64  Pulse: 61  SpO2: 98%  Weight: 73.7 kg (162 lb 6.4 oz)   Wt Readings from Last 3 Encounters:  03/09/23 73.7 kg (162 lb 6.4 oz)  01/25/23 73 kg (161 lb)  12/17/22 71 kg (156 lb 8 oz)    PHYSICAL  EXAM: General:  Elderly. No resp difficulty HEENT: normal Neck: supple. JVP 10-12. Carotids 2+ bilat; no bruits. No lymphadenopathy or thryomegaly appreciated. Cor: PMI nondisplaced. Regular rate & rhythm. No rubs, gallops or murmurs. Lungs: clear Abdomen: soft, nontender, nondistended. No hepatosplenomegaly. No bruits or masses. Good bowel sounds. Extremities: no cyanosis, clubbing, rash,  tr-1+ edema Neuro: alert & orientedx3, cranial nerves grossly intact. moves all 4 extremities w/o difficulty. Affect pleasant   ECG:AV paced 65 Personally reviewed   ASSESSMENT & PLAN:  1. HFpEF/Pulmonary HTN likely restrictive CM - Echo 3/24: LVEF 50-55%. RV mod HK.RVSP 59. Severe biatrial enlargement. Mod MR. Mod-sev TR (suggestive of restrictive CM) - Likely WHO Group 2/3 PH so not candidate for selective pulmonary artery vasodilators - Goals of therapy are to maintain euvolemia and avoid hypoxemia - Stable/improved NYHA III (multifactorial) - Volume status much improved on torsemide 100/40. Check labs to make sure she is not overdiuresed - Continue Farxiga 10mg  daily  - BP soft. Drop losartan to 25 daily - Stop bisoprolol - Not on MRA due to CKD/AKI - Given use of tikosyn and previous difficulty with metolazone will avoid - Labs today   2. Paroxysmal Atrial Fibrillation - Device interrogation in 07/2022 showed an AF burden of 0.8% and consistent with flutter. She remains on Tikosyn per EP. - Tolerating Eliquis - Management per Dr. Diona Browner   3. CAD - She  is s/p CABG in 1998 with LIMA-LAD, SVG-Diagonal, SVG-RI-OM, DES to PLA in 2006 and most recent ischemic evaluation was a low-risk NST in 09/2019. - Continue Atorvastatin. She is not on ASA given the need for anticoagulation. - No s/s angina   4. History of VT - She does have an ICD in place which is followed by Dr. Ladona Ridgel. Most recent interrogation in 07/2022 showed HF trends were abnormal from 05/2022 to 06/2022. Was also noted to have NSVT and VF of short duration but did not require interventions.    5. HTN - BP low.  - Stop bisoprolol. Decrease losartan   6. Stage IV CKD - Creatinine has been variable from 1.3 to 2.2 this year, averaging around 1.8 - 1.9 more recently.  - Last check 10/20/22 Scr 1.4 - Recheck today - Continue SGLT2i  Arvilla Meres, MD  3:21 PM

## 2023-03-10 ENCOUNTER — Telehealth (HOSPITAL_COMMUNITY): Payer: Self-pay | Admitting: Cardiology

## 2023-03-10 DIAGNOSIS — I5032 Chronic diastolic (congestive) heart failure: Secondary | ICD-10-CM

## 2023-03-10 MED ORDER — TORSEMIDE 100 MG PO TABS: 100.0000 mg | ORAL_TABLET | Freq: Every day | ORAL | Status: DC

## 2023-03-10 NOTE — Telephone Encounter (Signed)
-----   Message from Arvilla Meres sent at 03/10/2023 10:36 AM EST ----- Potassium is up.   1) give lokelma 10g x 1 2) cut torsemide to 100daily (previously was 100/40) 3) stop all supplemental potassium for now 4) Repeat BMET tomorrow 5) Bring back 2 weeks to see NP/PA or one of [place] for recheck on fluid status and renal function

## 2023-03-10 NOTE — Telephone Encounter (Signed)
Opened in error

## 2023-03-10 NOTE — Telephone Encounter (Signed)
Pts son returned call and aware of medication changes Son requested to have orders sent/called to [facility] [phone] [facility] aware of changes Orders sent via fax/email to [email or web address] Orders for [facility] placed Med list updated

## 2023-03-11 ENCOUNTER — Encounter (HOSPITAL_COMMUNITY): Payer: Medicare PPO

## 2023-03-11 DIAGNOSIS — N1832 Chronic kidney disease, stage 3b: Secondary | ICD-10-CM | POA: Diagnosis not present

## 2023-03-11 DIAGNOSIS — Z79899 Other long term (current) drug therapy: Secondary | ICD-10-CM | POA: Diagnosis not present

## 2023-03-11 DIAGNOSIS — I480 Paroxysmal atrial fibrillation: Secondary | ICD-10-CM | POA: Diagnosis not present

## 2023-03-11 DIAGNOSIS — I700 Atherosclerosis of aorta: Secondary | ICD-10-CM | POA: Diagnosis not present

## 2023-03-11 DIAGNOSIS — I2510 Atherosclerotic heart disease of native coronary artery without angina pectoris: Secondary | ICD-10-CM | POA: Diagnosis not present

## 2023-03-16 ENCOUNTER — Emergency Department (HOSPITAL_COMMUNITY): Payer: Medicare PPO

## 2023-03-16 ENCOUNTER — Inpatient Hospital Stay (HOSPITAL_COMMUNITY)
Admission: EM | Admit: 2023-03-16 | Discharge: 2023-03-23 | DRG: 280 | Disposition: A | Discharge status: Skilled Nursing Facility | Payer: Medicare PPO | Source: Skilled Nursing Facility | Attending: Internal Medicine | Admitting: Internal Medicine

## 2023-03-16 ENCOUNTER — Encounter (HOSPITAL_COMMUNITY): Admission: RE | Admit: 2023-03-16 | Payer: Medicare PPO | Source: Ambulatory Visit

## 2023-03-16 ENCOUNTER — Encounter (HOSPITAL_COMMUNITY): Payer: Self-pay

## 2023-03-16 ENCOUNTER — Other Ambulatory Visit: Payer: Self-pay

## 2023-03-16 DIAGNOSIS — M542 Cervicalgia: Secondary | ICD-10-CM | POA: Diagnosis not present

## 2023-03-16 DIAGNOSIS — I6200 Nontraumatic subdural hemorrhage, unspecified: Secondary | ICD-10-CM | POA: Diagnosis not present

## 2023-03-16 DIAGNOSIS — Z23 Encounter for immunization: Secondary | ICD-10-CM | POA: Diagnosis not present

## 2023-03-16 DIAGNOSIS — Z951 Presence of aortocoronary bypass graft: Secondary | ICD-10-CM | POA: Diagnosis not present

## 2023-03-16 DIAGNOSIS — D72829 Elevated white blood cell count, unspecified: Secondary | ICD-10-CM | POA: Diagnosis not present

## 2023-03-16 DIAGNOSIS — I4729 Other ventricular tachycardia: Secondary | ICD-10-CM | POA: Diagnosis present

## 2023-03-16 DIAGNOSIS — D631 Anemia in chronic kidney disease: Secondary | ICD-10-CM | POA: Diagnosis present

## 2023-03-16 DIAGNOSIS — I499 Cardiac arrhythmia, unspecified: Secondary | ICD-10-CM | POA: Diagnosis not present

## 2023-03-16 DIAGNOSIS — I6201 Nontraumatic acute subdural hemorrhage: Secondary | ICD-10-CM | POA: Diagnosis not present

## 2023-03-16 DIAGNOSIS — I5033 Acute on chronic diastolic (congestive) heart failure: Secondary | ICD-10-CM | POA: Diagnosis not present

## 2023-03-16 DIAGNOSIS — R55 Syncope and collapse: Secondary | ICD-10-CM | POA: Diagnosis present

## 2023-03-16 DIAGNOSIS — R7989 Other specified abnormal findings of blood chemistry: Secondary | ICD-10-CM | POA: Diagnosis not present

## 2023-03-16 DIAGNOSIS — I272 Pulmonary hypertension, unspecified: Secondary | ICD-10-CM | POA: Diagnosis present

## 2023-03-16 DIAGNOSIS — E782 Mixed hyperlipidemia: Secondary | ICD-10-CM | POA: Diagnosis present

## 2023-03-16 DIAGNOSIS — I214 Non-ST elevation (NSTEMI) myocardial infarction: Secondary | ICD-10-CM | POA: Diagnosis not present

## 2023-03-16 DIAGNOSIS — Z881 Allergy status to other antibiotic agents status: Secondary | ICD-10-CM

## 2023-03-16 DIAGNOSIS — Z8249 Family history of ischemic heart disease and other diseases of the circulatory system: Secondary | ICD-10-CM | POA: Diagnosis not present

## 2023-03-16 DIAGNOSIS — J9610 Chronic respiratory failure, unspecified whether with hypoxia or hypercapnia: Secondary | ICD-10-CM | POA: Diagnosis not present

## 2023-03-16 DIAGNOSIS — I469 Cardiac arrest, cause unspecified: Secondary | ICD-10-CM | POA: Diagnosis not present

## 2023-03-16 DIAGNOSIS — Z043 Encounter for examination and observation following other accident: Secondary | ICD-10-CM | POA: Diagnosis not present

## 2023-03-16 DIAGNOSIS — I4721 Torsades de pointes: Secondary | ICD-10-CM | POA: Diagnosis not present

## 2023-03-16 DIAGNOSIS — I442 Atrioventricular block, complete: Secondary | ICD-10-CM | POA: Diagnosis present

## 2023-03-16 DIAGNOSIS — R220 Localized swelling, mass and lump, head: Secondary | ICD-10-CM | POA: Diagnosis not present

## 2023-03-16 DIAGNOSIS — I482 Chronic atrial fibrillation, unspecified: Secondary | ICD-10-CM | POA: Diagnosis not present

## 2023-03-16 DIAGNOSIS — I130 Hypertensive heart and chronic kidney disease with heart failure and stage 1 through stage 4 chronic kidney disease, or unspecified chronic kidney disease: Secondary | ICD-10-CM | POA: Diagnosis not present

## 2023-03-16 DIAGNOSIS — I4819 Other persistent atrial fibrillation: Secondary | ICD-10-CM | POA: Diagnosis present

## 2023-03-16 DIAGNOSIS — S065X0D Traumatic subdural hemorrhage without loss of consciousness, subsequent encounter: Secondary | ICD-10-CM | POA: Diagnosis not present

## 2023-03-16 DIAGNOSIS — Z9981 Dependence on supplemental oxygen: Secondary | ICD-10-CM

## 2023-03-16 DIAGNOSIS — J4489 Other specified chronic obstructive pulmonary disease: Secondary | ICD-10-CM | POA: Diagnosis present

## 2023-03-16 DIAGNOSIS — Z885 Allergy status to narcotic agent status: Secondary | ICD-10-CM

## 2023-03-16 DIAGNOSIS — I493 Ventricular premature depolarization: Secondary | ICD-10-CM | POA: Diagnosis present

## 2023-03-16 DIAGNOSIS — L03115 Cellulitis of right lower limb: Secondary | ICD-10-CM | POA: Diagnosis not present

## 2023-03-16 DIAGNOSIS — M47812 Spondylosis without myelopathy or radiculopathy, cervical region: Secondary | ICD-10-CM | POA: Diagnosis not present

## 2023-03-16 DIAGNOSIS — I270 Primary pulmonary hypertension: Secondary | ICD-10-CM | POA: Diagnosis not present

## 2023-03-16 DIAGNOSIS — Z950 Presence of cardiac pacemaker: Secondary | ICD-10-CM | POA: Diagnosis not present

## 2023-03-16 DIAGNOSIS — J9611 Chronic respiratory failure with hypoxia: Secondary | ICD-10-CM | POA: Diagnosis present

## 2023-03-16 DIAGNOSIS — M7989 Other specified soft tissue disorders: Secondary | ICD-10-CM | POA: Diagnosis not present

## 2023-03-16 DIAGNOSIS — I2510 Atherosclerotic heart disease of native coronary artery without angina pectoris: Secondary | ICD-10-CM | POA: Diagnosis present

## 2023-03-16 DIAGNOSIS — Z4502 Encounter for adjustment and management of automatic implantable cardiac defibrillator: Secondary | ICD-10-CM

## 2023-03-16 DIAGNOSIS — S022XXA Fracture of nasal bones, initial encounter for closed fracture: Secondary | ICD-10-CM | POA: Diagnosis present

## 2023-03-16 DIAGNOSIS — N184 Chronic kidney disease, stage 4 (severe): Secondary | ICD-10-CM | POA: Diagnosis not present

## 2023-03-16 DIAGNOSIS — F419 Anxiety disorder, unspecified: Secondary | ICD-10-CM | POA: Diagnosis present

## 2023-03-16 DIAGNOSIS — I6782 Cerebral ischemia: Secondary | ICD-10-CM | POA: Diagnosis not present

## 2023-03-16 DIAGNOSIS — I5032 Chronic diastolic (congestive) heart failure: Secondary | ICD-10-CM | POA: Diagnosis not present

## 2023-03-16 DIAGNOSIS — M1712 Unilateral primary osteoarthritis, left knee: Secondary | ICD-10-CM | POA: Diagnosis not present

## 2023-03-16 DIAGNOSIS — Z79899 Other long term (current) drug therapy: Secondary | ICD-10-CM

## 2023-03-16 DIAGNOSIS — W19XXXA Unspecified fall, initial encounter: Secondary | ICD-10-CM | POA: Diagnosis present

## 2023-03-16 DIAGNOSIS — S065X9A Traumatic subdural hemorrhage with loss of consciousness of unspecified duration, initial encounter: Secondary | ICD-10-CM | POA: Diagnosis not present

## 2023-03-16 DIAGNOSIS — S0232XA Fracture of orbital floor, left side, initial encounter for closed fracture: Secondary | ICD-10-CM | POA: Diagnosis not present

## 2023-03-16 DIAGNOSIS — Z7901 Long term (current) use of anticoagulants: Secondary | ICD-10-CM | POA: Diagnosis not present

## 2023-03-16 DIAGNOSIS — S022XXD Fracture of nasal bones, subsequent encounter for fracture with routine healing: Secondary | ICD-10-CM | POA: Diagnosis not present

## 2023-03-16 DIAGNOSIS — I4901 Ventricular fibrillation: Secondary | ICD-10-CM | POA: Diagnosis not present

## 2023-03-16 DIAGNOSIS — R0989 Other specified symptoms and signs involving the circulatory and respiratory systems: Secondary | ICD-10-CM | POA: Diagnosis not present

## 2023-03-16 DIAGNOSIS — M1711 Unilateral primary osteoarthritis, right knee: Secondary | ICD-10-CM | POA: Diagnosis not present

## 2023-03-16 DIAGNOSIS — S0990XA Unspecified injury of head, initial encounter: Secondary | ICD-10-CM | POA: Diagnosis not present

## 2023-03-16 DIAGNOSIS — I6523 Occlusion and stenosis of bilateral carotid arteries: Secondary | ICD-10-CM | POA: Diagnosis not present

## 2023-03-16 DIAGNOSIS — I480 Paroxysmal atrial fibrillation: Secondary | ICD-10-CM | POA: Diagnosis not present

## 2023-03-16 DIAGNOSIS — I10 Essential (primary) hypertension: Secondary | ICD-10-CM | POA: Diagnosis present

## 2023-03-16 DIAGNOSIS — S0003XA Contusion of scalp, initial encounter: Secondary | ICD-10-CM | POA: Diagnosis not present

## 2023-03-16 DIAGNOSIS — M4802 Spinal stenosis, cervical region: Secondary | ICD-10-CM | POA: Diagnosis not present

## 2023-03-16 DIAGNOSIS — J449 Chronic obstructive pulmonary disease, unspecified: Secondary | ICD-10-CM | POA: Diagnosis not present

## 2023-03-16 DIAGNOSIS — S065XAA Traumatic subdural hemorrhage with loss of consciousness status unknown, initial encounter: Secondary | ICD-10-CM | POA: Insufficient documentation

## 2023-03-16 DIAGNOSIS — J90 Pleural effusion, not elsewhere classified: Secondary | ICD-10-CM | POA: Diagnosis not present

## 2023-03-16 DIAGNOSIS — Z7401 Bed confinement status: Secondary | ICD-10-CM | POA: Diagnosis not present

## 2023-03-16 LAB — CBC WITH DIFFERENTIAL/PLATELET
Abs Immature Granulocytes: 0.04 10*3/uL (ref 0.00–0.07)
Basophils Absolute: 0.1 10*3/uL (ref 0.0–0.1)
Basophils Relative: 1 %
Eosinophils Absolute: 0.3 10*3/uL (ref 0.0–0.5)
Eosinophils Relative: 3 %
HCT: 37.3 % (ref 36.0–46.0)
Hemoglobin: 12.1 g/dL (ref 12.0–15.0)
Immature Granulocytes: 0 %
Lymphocytes Relative: 27 %
Lymphs Abs: 2.7 10*3/uL (ref 0.7–4.0)
MCH: 32.4 pg (ref 26.0–34.0)
MCHC: 32.4 g/dL (ref 30.0–36.0)
MCV: 100 fL (ref 80.0–100.0)
Monocytes Absolute: 1.2 10*3/uL — ABNORMAL HIGH (ref 0.1–1.0)
Monocytes Relative: 12 %
Neutro Abs: 6 10*3/uL (ref 1.7–7.7)
Neutrophils Relative %: 57 %
Platelets: 186 10*3/uL (ref 150–400)
RBC: 3.73 MIL/uL — ABNORMAL LOW (ref 3.87–5.11)
RDW: 15.3 % (ref 11.5–15.5)
WBC: 10.3 10*3/uL (ref 4.0–10.5)
nRBC: 0 % (ref 0.0–0.2)

## 2023-03-16 LAB — BASIC METABOLIC PANEL
Anion gap: 11 (ref 5–15)
BUN: 45 mg/dL — ABNORMAL HIGH (ref 8–23)
CO2: 27 mmol/L (ref 22–32)
Calcium: 9.2 mg/dL (ref 8.9–10.3)
Chloride: 96 mmol/L — ABNORMAL LOW (ref 98–111)
Creatinine, Ser: 1.71 mg/dL — ABNORMAL HIGH (ref 0.44–1.00)
GFR, Estimated: 28 mL/min — ABNORMAL LOW (ref >60)
Glucose, Bld: 134 mg/dL — ABNORMAL HIGH (ref 70–99)
Potassium: 3.5 mmol/L (ref 3.5–5.1)
Sodium: 134 mmol/L — ABNORMAL LOW (ref 135–145)

## 2023-03-16 LAB — TROPONIN I (HIGH SENSITIVITY)
Troponin I (High Sensitivity): 34 ng/L — ABNORMAL HIGH (ref <18)
Troponin I (High Sensitivity): 180 ng/L (ref <18)

## 2023-03-16 LAB — CK: Total CK: 44 U/L (ref 38–234)

## 2023-03-16 MED ORDER — ACETAMINOPHEN 650 MG RE SUPP: 650.0000 mg | Freq: Four times a day (QID) | RECTAL | Status: DC | PRN

## 2023-03-16 MED ORDER — FENTANYL CITRATE PF 50 MCG/ML IJ SOSY
12.5000 ug | PREFILLED_SYRINGE | INTRAMUSCULAR | Status: DC | PRN
  Filled 2023-03-16: qty 1

## 2023-03-16 MED ORDER — TETANUS-DIPHTH-ACELL PERTUSSIS 5-2.5-18.5 LF-MCG/0.5 IM SUSY
0.5000 mL | PREFILLED_SYRINGE | Freq: Once | INTRAMUSCULAR | Status: AC
  Administered 2023-03-16: 0.5 mL via INTRAMUSCULAR
  Filled 2023-03-16: qty 0.5

## 2023-03-16 MED ORDER — ALBUTEROL SULFATE (2.5 MG/3ML) 0.083% IN NEBU: 2.5000 mg | INHALATION_SOLUTION | Freq: Four times a day (QID) | RESPIRATORY_TRACT | Status: DC | PRN

## 2023-03-16 MED ORDER — APIXABAN 2.5 MG PO TABS: 2.5000 mg | ORAL_TABLET | Freq: Two times a day (BID) | ORAL | Status: DC

## 2023-03-16 MED ORDER — LOSARTAN POTASSIUM 25 MG PO TABS
25.0000 mg | ORAL_TABLET | Freq: Every day | ORAL | Status: DC
  Administered 2023-03-17 – 2023-03-20 (×4): 25 mg via ORAL
  Filled 2023-03-16 (×4): qty 1

## 2023-03-16 MED ORDER — SODIUM CHLORIDE 0.9% FLUSH
3.0000 mL | Freq: Two times a day (BID) | INTRAVENOUS | Status: DC
  Administered 2023-03-17 – 2023-03-23 (×13): 3 mL via INTRAVENOUS

## 2023-03-16 MED ORDER — OXYCODONE HCL 5 MG PO TABS
2.5000 mg | ORAL_TABLET | ORAL | Status: DC | PRN
  Administered 2023-03-18: 2.5 mg via ORAL
  Filled 2023-03-16 (×2): qty 1

## 2023-03-16 MED ORDER — TORSEMIDE 20 MG PO TABS
100.0000 mg | ORAL_TABLET | Freq: Every day | ORAL | Status: DC
  Administered 2023-03-17 – 2023-03-23 (×7): 100 mg via ORAL
  Filled 2023-03-16 (×7): qty 5

## 2023-03-16 MED ORDER — UMECLIDINIUM BROMIDE 62.5 MCG/ACT IN AEPB
1.0000 | INHALATION_SPRAY | Freq: Every day | RESPIRATORY_TRACT | Status: DC
  Administered 2023-03-19 – 2023-03-23 (×5): 1 via RESPIRATORY_TRACT
  Filled 2023-03-16 (×2): qty 7

## 2023-03-16 MED ORDER — SENNA 8.6 MG PO TABS: 1.0000 | ORAL_TABLET | Freq: Every day | ORAL | Status: DC | PRN

## 2023-03-16 MED ORDER — FLUTICASONE FUROATE-VILANTEROL 100-25 MCG/ACT IN AEPB
1.0000 | INHALATION_SPRAY | Freq: Every day | RESPIRATORY_TRACT | Status: DC
  Administered 2023-03-19 – 2023-03-23 (×5): 1 via RESPIRATORY_TRACT
  Filled 2023-03-16 (×2): qty 28

## 2023-03-16 MED ORDER — ACETAMINOPHEN 325 MG PO TABS
650.0000 mg | ORAL_TABLET | Freq: Four times a day (QID) | ORAL | Status: DC | PRN
  Administered 2023-03-17 – 2023-03-21 (×4): 650 mg via ORAL
  Filled 2023-03-16 (×5): qty 2

## 2023-03-16 MED ORDER — ATORVASTATIN CALCIUM 10 MG PO TABS
20.0000 mg | ORAL_TABLET | Freq: Every day | ORAL | Status: DC
  Filled 2023-03-16: qty 2

## 2023-03-16 MED ORDER — ACETAMINOPHEN 325 MG PO TABS
650.0000 mg | ORAL_TABLET | Freq: Once | ORAL | Status: AC
  Administered 2023-03-16: 650 mg via ORAL
  Filled 2023-03-16: qty 2

## 2023-03-16 MED ORDER — ALBUTEROL SULFATE HFA 108 (90 BASE) MCG/ACT IN AERS: 2.0000 | INHALATION_SPRAY | Freq: Four times a day (QID) | RESPIRATORY_TRACT | Status: DC | PRN

## 2023-03-16 NOTE — ED Provider Notes (Signed)
HPI: Patient came from independent, she was initially called out as a Engineer, building services, she had a syncope during cardiac rehab, no CPR performed, she regained consciousness shortly thereafter.  There was no CT at any pain and so she was sent to [facility] for further evaluation.  Arrived from cardiac rehab status post syncope, large hematoma to left upper forehead, is on blood thinners.  No CTA any pending sent for imaging.  She had a mildly elevated troponin at 34, no chest pain, we are going to trend, interrogate pacemaker, her creatinine is improved from recent baseline.  No other significant signs of trauma.    I independently interpreted CT head, C-spine, radiographs of bilateral knees which showed arthritis in knees without acute injury, hematoma on right scalp, but no other acute injury in the head or C-spine.  On repeat evaluation patient with what appears to be a wide-complex or paced tachycardic rhythm in the room, after pacemaker was interrogated it was found that patient had a V-fib arrest earlier today and was defibrillated out of it, while I observed her having a wide-complex tachycardic rhythm earlier she was in VT.  She has a return to a normal paced rhythm at this time.  .Critical Care  Performed by: Olene Floss, PA-C Authorized by: Olene Floss, PA-C   Critical care provider statement:    Critical care time (minutes):  35   Critical care was necessary to treat or prevent imminent or life-threatening deterioration of the following conditions:  Cardiac failure   Critical care was time spent personally by me on the following activities:  Development of treatment plan with patient or surrogate, discussions with consultants, evaluation of patient's response to treatment, examination of patient, ordering and review of laboratory studies, ordering and review of radiographic studies, ordering and performing treatments and interventions, pulse oximetry, re-evaluation of patient's  condition and review of old charts   Care discussed with: admitting provider    Clinical Course as of 03/16/23 2309  Tue Mar 16, 2023  1416 Pt with FOT w/ head injury, needs urgent CT imaging, will send to [facility] for imaging. Will not delay transfer for labs.  [SG]  1458 Creatinine(!): 1.71 Improved from prior  [SG]  1459 Troponin I (High Sensitivity)(!): 34 Mild elev, no CP, plan to trend [SG]  2027 Was in vfib, got shocked, was in VT earlier tonight [CP]    Clinical Course User Index [CP] Parisa Pinela H, PA-C [SG] Sloan Leiter, DO   Consults: I spoke with cardiologist, Dr. Eden Emms who recommended admission, thinks that based on initial story her syncope may be unrelated to her pacemaker versus cardiac in origin but will reconsult after pacemaker interrogation, spoke with the cardiologist after pacemaker interrogation to discuss whether they would like antiarrhythmics on board. I spoke with the hospitalist, Dr. Antionette Char who agrees to admission for syncope, V-fib arrest, V. Tach.  We reach back out to cardiology, spoke with Dr. Derrell Lolling who does not recommend any antiarrhythmics at this time, will reach out to electrophysiology team to evaluate patient in the morning.   [place] 03/16/23 2309    Lonell Grandchild, MD 03/17/23 Windell Moment

## 2023-03-16 NOTE — ED Notes (Signed)
[facility] here to transport pt to [facility]

## 2023-03-16 NOTE — ED Provider Notes (Signed)
[place] [facility]ERGENC[facility] DEPARTMENT AT [facility] Provider Note  CSN: [id number] Arrival date & time: 03/16/23 1340  Chief Complaint(s) Loss of Consciousness  HPI [name] [name] is a 89 y.o. female with past medical history as below, significant for atrial fibrillation on Eliquis, COPD, CAD status post CABG 1998, HTN, ICD, pHTN (Dr Gala Romney), HFpEF who presents to the ED with complaint of syncope, head injury  Patient was at cardiac rehab and was about to start her rehab session when she fell to the ground had syncopal episode.  No seizure activity reported by bystanders.  She regained consciousness few moments later.  Complaining of headache.  She was at outpatient cardiac rehab when this happened and CODE BLUE was called overhead, see CODE BLUE response documentation by myself.  Following the episode patient was able to be sat in her wheelchair, she does not have any dyspnea or chest pain, no palpitations, she is breathing comfortably on her 3 L nasal cannula which she uses at baseline.  She has a headache, some facial pain.  Spoke with daughter at bedside, reports that she has been feeling unwell for the past 48 hours, no specific symptoms but reports that she did not want to go to rehab this morning but seems as though she for herself to go because she missed last week per the daughter  Past Medical History Past Medical History:  Diagnosis Date   Anxiety    Asthma    Chronic atrial fibrillation (HCC)    Chronic back pain    Chronic diastolic heart failure (HCC)    Chronic obstructive pulmonary disease, unspecified (HCC)    Complete heart block (HCC)    COPD (chronic obstructive pulmonary disease) (HCC)    Coronary atherosclerosis of native coronary artery    a. s/p CABG in 1998 with LIMA-LAD, SVG-Diagonal, SVG-RI-OM b. DES to PLA in 2006   DDD (degenerative disc disease), lumbar    Essential hypertension    Headache    ICD (implantable cardioverter-defibrillator) in place     Mixed hyperlipidemia    Osteoarthritis    Ventricular fibrillation (HCC) 2003   a. seen on PPM interrogation a/w syncope   Patient Active Problem List   Diagnosis Date Noted   Erythropoietin deficiency anemia 12/22/2022   AKI (acute kidney injury) (HCC) 09/05/2022   Anemia due to stage 3 chronic kidney disease (HCC) 08/28/2022   Acute on chronic diastolic (congestive) heart failure (HCC) 08/18/2022   Cellulitis of right leg 07/02/2022   Chronic obstructive asthma (HCC) 05/22/2022   Chronic respiratory failure with hypoxia (HCC) 05/22/2022   Exercise hypoxemia 04/28/2022   Essential (primary) hypertension    Hyperlipidemia, unspecified    Presence of cardiac pacemaker    Primary pulmonary hypertension (HCC)    Unspecified osteoarthritis, unspecified site    Anxiety disorder, unspecified    Atherosclerotic heart disease of native coronary artery without angina pectoris    Chronic atrial fibrillation (HCC)    Chronic diastolic (congestive) heart failure (HCC)    Chronic obstructive pulmonary disease, unspecified (HCC)    Headache    Unspecified fall, initial encounter    Degenerative disc disease, cervical 02/09/2018   Chronic kidney disease, stage III (moderate) (HCC) 05/07/2017   Pacemaker complications 09/28/2013   DOE (dyspnea on exertion) 07/19/2013   Chronic diastolic heart failure (HCC) 04/11/2013   Aortic stenosis 10/18/2012   Encounter for long-term (current) use of anticoagulants 10/08/2011   Mixed hyperlipidemia 08/25/2008   Essential hypertension, benign 08/25/2008  Coronary atherosclerosis of native coronary artery 08/25/2008   Automatic implantable cardioverter-defibrillator in situ 08/25/2008   Home Medication(s) Prior to Admission medications   Medication Sig Start Date End Date Taking? Authorizing Provider  acetaminophen (TYLENOL) 325 MG tablet Take 2 tablets (650 mg total) by mouth every 6 (six) hours as needed for mild pain (or Fever >/= 101). 08/23/22    Shon Hale, MD  albuterol (VENTOLIN HFA) 108 (90 Base) MCG/ACT inhaler Inhale 2 puffs into the lungs every 6 (six) hours as needed for wheezing or shortness of breath. 08/23/22   Shon Hale, MD  apixaban (ELIQUIS) 2.5 MG TABS tablet Take 1 tablet (2.5 mg total) by mouth 2 (two) times daily. 08/23/22   Shon Hale, MD  atorvastatin (LIPITOR) 20 MG tablet Take 1 tablet (20 mg total) by mouth daily. 08/23/22   Shon Hale, MD  Calcium Carbonate-Vitamin D (CALCIUM 600 + D PO) Take 1 tablet by mouth 2 (two) times daily.    [provider]  dapagliflozin propanediol (FARXIGA) 10 MG TABS tablet Take 1 tablet (10 mg total) by mouth daily before breakfast. 08/27/22   Jonelle Sidle, MD  dofetilide (TIKOSYN) 125 MCG capsule Take 1 capsule (125 mcg total) by mouth 2 (two) times daily. 08/23/22   Shon Hale, MD  Fluticasone-Umeclidin-Vilant (TRELEGY ELLIPTA) 100-62.5-25 MCG/ACT AEPB Inhale 1 puff into the lungs daily. 08/23/22   Shon Hale, MD  folic acid (FOLVITE) 1 MG tablet Take 1 tablet (1 mg total) by mouth daily. 08/23/22   Shon Hale, MD  losartan (COZAAR) 25 MG tablet Take 1 tablet (25 mg total) by mouth daily. 03/09/23   Bensimhon, Bevelyn Buckles, MD  Polyethyl Glycol-Propyl Glycol (SYSTANE OP) Place 1 drop into both eyes daily as needed (Dry eyes).    [provider]  polyethylene glycol (MIRALAX / GLYCOLAX) 17 g packet Take 17 g by mouth daily as needed for mild constipation. 08/23/22   Shon Hale, MD  torsemide (DEMADEX) 100 MG tablet Take 1 tablet (100 mg total) by mouth daily. 03/10/23   Bensimhon, Bevelyn Buckles, MD  triamcinolone cream (KENALOG) 0.1 % Apply 1 application. topically daily as needed (itching).    [provider]                                                                                                                                    Past Surgical History Past Surgical History:  Procedure Laterality Date   CARDIAC  DEFIBRILLATOR PLACEMENT     MDT dual chamber ICD   CARDIOVERSION N/A 08/09/2014   Procedure: CARDIOVERSION;  Surgeon: Thurmon Fair, MD;  Location: [facility];  Service: Cardiovascular;  Laterality: N/A;   CORONARY ARTERY BYPASS GRAFT     LIMA to LAD, SVG to diagonal, SVG to ramus and OM   ICD GENERATOR CHANGEOUT N/A 08/06/2021   Procedure: ICD GENERATOR CHANGEOUT;  Surgeon: Marinus Maw, MD;  Location: [facility] INVASIVE CV  LAB;  Service: Cardiovascular;  Laterality: N/A;   IMPLANTABLE CARDIOVERTER DEFIBRILLATOR GENERATOR CHANGE N/A 09/25/2013   Procedure: IMPLANTABLE CARDIOVERTER DEFIBRILLATOR GENERATOR CHANGE;  Surgeon: Marinus Maw, MD;  Location: [facility] CATH LAB;  Service: Cardiovascular;  Laterality: N/A;   LEAD REVISION N/A 09/29/2013   Procedure: LEAD REVISION;  Surgeon: Duke Salvia, MD;  Location: [facility] CATH LAB;  Service: Cardiovascular;  Laterality: N/A;   TONSILLECTOMY     YAG LASER APPLICATION Left 12/27/2012   Procedure: YAG LASER APPLICATION;  Surgeon: Loraine Leriche T. Nile Riggs, MD;  Location: [facility] ORS;  Service: Ophthalmology;  Laterality: Left;   YAG LASER APPLICATION Right 01/10/2013   Procedure: YAG LASER APPLICATION;  Surgeon: Loraine Leriche T. Nile Riggs, MD;  Location: [facility];  Service: Ophthalmology;  Laterality: Right;   Family History Family History  Problem Relation Age of Onset   Heart attack Father    Heart attack Brother     Social History Social History   Tobacco Use   Smoking status: Never   Smokeless tobacco: Never  Vaping Use   Vaping status: Never Used  Substance Use Topics   Alcohol use: No    Alcohol/week: 0.0 standard drinks of alcohol   Drug use: No   Allergies Codeine, Fentanyl, and Keflex [cephalexin]  Review of Systems Review of Systems  Constitutional:  Positive for fatigue. Negative for chills and fever.  Respiratory:  Negative for chest tightness and shortness of breath.   Cardiovascular:  Negative for chest pain.  Gastrointestinal:  Negative for abdominal pain,  nausea and vomiting.  Genitourinary:  Negative for difficulty urinating.  Skin:  Positive for wound.  Neurological:  Positive for syncope and headaches.    Physical Exam Vital Signs  I have reviewed the triage vital signs BP (!) 139/54   Pulse (!) 59   Temp 98 F (36.7 C) (Oral)   Resp (!) 22   Ht 5' (1.524 m)   Wt 73.7 kg   SpO2 98%   BMI 31.71 kg/m  Physical Exam Vitals and nursing note reviewed. Exam conducted with a chaperone present.  Constitutional:      General: She is not in acute distress.    Appearance: Normal appearance.  HENT:     Head: Normocephalic.     Comments: Frontal hematoma, epistaxis    Right Ear: External ear normal.     Left Ear: External ear normal.     Nose: Nose normal.     Mouth/Throat:     Mouth: Mucous membranes are moist.  Eyes:     General: No scleral icterus.       Right eye: No discharge.        Left eye: No discharge.     Extraocular Movements: Extraocular movements intact.     Pupils: Pupils are equal, round, and reactive to light.  Cardiovascular:     Rate and Rhythm: Normal rate. Rhythm irregular.     Pulses: Normal pulses.     Heart sounds: Normal heart sounds.  Pulmonary:     Effort: Pulmonary effort is normal. No respiratory distress.     Breath sounds: Normal breath sounds. No stridor.  Abdominal:     General: Abdomen is flat. There is no distension.     Palpations: Abdomen is soft.     Tenderness: There is no abdominal tenderness.  Musculoskeletal:     Cervical back: No rigidity.     Right lower leg: No edema.     Left lower leg: No edema.  Skin:  General: Skin is warm and dry.     Capillary Refill: Capillary refill takes less than 2 seconds.  Neurological:     Mental Status: She is alert and oriented to person, place, and time.     GCS: GCS eye subscore is 4. GCS verbal subscore is 5. GCS motor subscore is 6.     Cranial Nerves: Cranial nerves 2-12 are intact.     Sensory: Sensation is intact.     Coordination:  Coordination is intact.     Comments: Gait testing deferred secondary to patient safety.  Strength 5/5 BLUE BLLE   Psychiatric:        Mood and Affect: Mood normal.        Behavior: Behavior normal. Behavior is cooperative.     ED Results and Treatments Labs (all labs ordered are listed, but only abnormal results are displayed) Labs Reviewed  CBC WITH DIFFERENTIAL/PLATELET - Abnormal; Notable for the following components:      Result Value   RBC 3.73 (*)    Monocytes Absolute 1.2 (*)    All other components within normal limits  BASIC METABOLIC PANEL - Abnormal; Notable for the following components:   Sodium 134 (*)    Chloride 96 (*)    Glucose, Bld 134 (*)    BUN 45 (*)    Creatinine, Ser 1.71 (*)    GFR, Estimated 28 (*)    All other components within normal limits  TROPONIN I (HIGH SENSITIVITY) - Abnormal; Notable for the following components:   Troponin I (High Sensitivity) 34 (*)    All other components within normal limits  CK                                                                                                                          Radiology No results found.  Pertinent labs & imaging results that were available during my care of the patient were reviewed by me and considered in my medical decision making (see MDM for details).  Medications Ordered in ED Medications  Tdap (BOOSTRIX) injection 0.5 mL (0.5 mLs Intramuscular Given 03/16/23 1405)                                                                                                                                     Procedures .Critical Care  Performed by: Sloan Leiter, DO Authorized by: Sloan Leiter,  DO   Critical care provider statement:    Critical care time (minutes):  30   Critical care time was exclusive of:  Separately billable procedures and treating other patients   Critical care was necessary to treat or prevent imminent or life-threatening deterioration of the following  conditions:  Trauma   Critical care was time spent personally by me on the following activities:  Development of treatment plan with patient or surrogate, discussions with consultants, evaluation of patient's response to treatment, examination of patient, ordering and review of laboratory studies, ordering and review of radiographic studies, ordering and performing treatments and interventions, pulse oximetry, re-evaluation of patient's condition, review of old charts and obtaining history from patient or surrogate   (including critical care time)  Medical Decision Making / ED Course    Medical Decision Making:    [name] [name] is a 89 y.o. female with past medical history as below, significant for atrial fibrillation on Eliquis, COPD, CAD status post CABG 1998, HTN, ICD, pHTN (Dr Gala Romney), HFpEF who presents to the ED with complaint of syncope, head injury The complaint involves an extensive differential diagnosis and also carries with it a high risk of complications and morbidity.  Serious etiology was considered. Ddx includes but is not limited to: Vasovagal, orthostatic, cardiac syncope, arrhythmia, hypovolemia, seizure etc.  Complete initial physical exam performed, notably the patient was in evidence of trauma to head, HDS.    Reviewed and confirmed nursing documentation for past medical history, family history, social history.  Vital signs reviewed.       Clinical Course as of 03/16/23 1505  Tue Mar 16, 2023  1416 Pt with FOT w/ head injury, needs urgent CT imaging, will send to [facility] for imaging. Will not delay transfer for labs.  [SG]  1458 Creatinine(!): 1.71 Improved from prior  [SG]  1459 Troponin I (High Sensitivity)(!): 34 Mild elev, no CP, plan to trend [SG]    Clinical Course User Index [SG] Sloan Leiter, DO    Patient with syncopal episode with head injury (eliquis) at cardiac rehab, brought to the ED for evaluation. No seizure activity was reported by bystander, no  CPR.  Plan to get CT imaging of the head and C-spine, get screening labs for syncope, device interrogation. Update tetanus. Forehead wound does not appear to require suturing, large hematoma underlying the forehead wound would likely prohibit suture if was necessary regardless.  No obvious septal hematoma. Epistaxis has resolved. CT is not available at this facility currently 2/2 equipment failure, will send to [facility], accepted by Dr. Particia Nearing. Dispo per receiving team.            Additional history obtained: -Additional history obtained from family -External records from outside source obtained and reviewed including: Chart review including previous notes, labs, imaging, consultation notes including  Primary care documentation, home medications   Lab Tests: -I ordered, reviewed, and interpreted labs.   The pertinent results include:   Labs Reviewed  CBC WITH DIFFERENTIAL/PLATELET - Abnormal; Notable for the following components:      Result Value   RBC 3.73 (*)    Monocytes Absolute 1.2 (*)    All other components within normal limits  BASIC METABOLIC PANEL - Abnormal; Notable for the following components:   Sodium 134 (*)    Chloride 96 (*)    Glucose, Bld 134 (*)    BUN 45 (*)    Creatinine, Ser 1.71 (*)    GFR, Estimated 28 (*)  All other components within normal limits  TROPONIN I (HIGH SENSITIVITY) - Abnormal; Notable for the following components:   Troponin I (High Sensitivity) 34 (*)    All other components within normal limits  CK    Notable for Cr similar to prior, trop leak+ no cp  EKG   EKG Interpretation Date/Time:  Tuesday March 16 2023 14:13:52 EST Ventricular Rate:  60 PR Interval:    QRS Duration:  175 QT Interval:  516 QTC Calculation: 516 R Axis:   -82  Text Interpretation: Junctional rhythm IVCD, consider atypical RBBB LVH with secondary repolarization abnormality similar to prior no stemi paced Confirmed by Tanda Rockers ([id number]) on  03/16/2023 2:29:57 PM         Imaging Studies ordered: I ordered imaging studies including CTH CT c/s, CXR    Medicines ordered and prescription drug management: Meds ordered this encounter  Medications   Tdap (BOOSTRIX) injection 0.5 mL    -I have reviewed the patients home medicines and have made adjustments as needed   Consultations Obtained: na   Cardiac Monitoring: The patient was maintained on a cardiac monitor.  I personally viewed and interpreted the cardiac monitored which showed an underlying rhythm of: afib Continuous pulse oximetry interpreted by myself, 98% on 3L.    Social Determinants of Health:  Diagnosis or treatment significantly limited by social determinants of health: lives at home   Reevaluation: After the interventions noted above, I reevaluated the patient and found that they have improved  Co morbidities that complicate the patient evaluation  Past Medical History:  Diagnosis Date   Anxiety    Asthma    Chronic atrial fibrillation (HCC)    Chronic back pain    Chronic diastolic heart failure (HCC)    Chronic obstructive pulmonary disease, unspecified (HCC)    Complete heart block (HCC)    COPD (chronic obstructive pulmonary disease) (HCC)    Coronary atherosclerosis of native coronary artery    a. s/p CABG in 1998 with LIMA-LAD, SVG-Diagonal, SVG-RI-OM b. DES to PLA in 2006   DDD (degenerative disc disease), lumbar    Essential hypertension    Headache    ICD (implantable cardioverter-defibrillator) in place    Mixed hyperlipidemia    Osteoarthritis    Ventricular fibrillation (HCC) 2003   a. seen on PPM interrogation a/w syncope      Dispostion: Disposition decision including need for hospitalization was considered, and patient transferred.    Final Clinical Impression(s) / ED Diagnoses Final diagnoses:  Injury of head, initial encounter  Syncope, unspecified syncope type        Sloan Leiter, DO 03/16/23 1505

## 2023-03-16 NOTE — ED Notes (Signed)
Pt c/o bilateral knee pain as well as neck pain

## 2023-03-16 NOTE — ED Notes (Signed)
ED TO INPATIENT HANDOFF REPORT  ED Nurse Name and Phone #: Trinna Post, RN 61  S Name/Age/Gender [name] [name] 89 y.o. female Room/Bed: 039C/039C  Code Status   Code Status: Prior  Home/SNF/Other Home Patient oriented to: self, place, time, and situation Is this baseline? Yes   Triage Complete: Triage complete  Chief Complaint Syncope, cardiogenic [R55]  Triage Note Pt brought over by cardiac rehab for a syncopal episode. Pt has large hematoma to left upper forehead. Pt is on blood thinners.   Allergies Allergies  Allergen Reactions   Codeine Nausea And Vomiting   Fentanyl Itching    Itching, redness to scalp and neck   Keflex [Cephalexin] Itching and Rash    Level of Care/Admitting Diagnosis ED Disposition     ED Disposition  Admit   Condition  --   Comment  Hospital Area: [facility] [[id number]]  Level of Care: Progressive [102]  Admit to Progressive based on following criteria: CARDIOVASCULAR & THORACIC of moderate stability with acute coronary syndrome symptoms/low risk myocardial infarction/hypertensive urgency/arrhythmias/heart failure potentially compromising stability and stable post cardiovascular intervention patients.  May admit patient to [facility] or [facility] if equivalent level of care is available:: No  Covid Evaluation: Asymptomatic - no recent exposure (last 10 days) testing not required  Diagnosis: Syncope, cardiogenic [[id number]]  Admitting Physician: Briscoe Deutscher [[id number]]  Attending Physician: Briscoe Deutscher [[id number]]  Certification:: I certify this patient will need inpatient services for at least 2 midnights  Expected Medical Readiness: 03/18/2023          B Medical/Surgery History Past Medical History:  Diagnosis Date   Anxiety    Asthma    Chronic atrial fibrillation (HCC)    Chronic back pain    Chronic diastolic heart failure (HCC)    Chronic obstructive pulmonary disease, unspecified (HCC)    Complete heart  block (HCC)    COPD (chronic obstructive pulmonary disease) (HCC)    Coronary atherosclerosis of native coronary artery    a. s/p CABG in 1998 with LIMA-LAD, SVG-Diagonal, SVG-RI-OM b. DES to PLA in 2006   DDD (degenerative disc disease), lumbar    Essential hypertension    Headache    ICD (implantable cardioverter-defibrillator) in place    Mixed hyperlipidemia    Osteoarthritis    Ventricular fibrillation (HCC) 2003   a. seen on PPM interrogation a/w syncope   Past Surgical History:  Procedure Laterality Date   CARDIAC DEFIBRILLATOR PLACEMENT     MDT dual chamber ICD   CARDIOVERSION N/A 08/09/2014   Procedure: CARDIOVERSION;  Surgeon: Thurmon Fair, MD;  [facility];  Service: Cardiovascular;  Laterality: N/A;   CORONARY ARTERY BYPASS GRAFT     LIMA to LAD, SVG to diagonal, SVG to ramus and OM   ICD GENERATOR CHANGEOUT N/A 08/06/2021   Procedure: ICD GENERATOR CHANGEOUT;  Surgeon: Marinus Maw, MD;  Location[facility] INVASIVE CV LAB;  Service: Cardiovascular;  Laterality: N/A;   IMPLANTABLE CARDIOVERTER DEFIBRILLATOR GENERATOR CHANGE N/A 09/25/2013   Procedure: IMPLANTABLE CARDIOVERTER DEFIBRILLATOR GENERATOR CHANGE;  Surgeon: Marinus Maw, MD;  Location: [facility] CATH LAB;  Service: Cardiovascular;  Laterality: N/A;   LEAD REVISION N/A 09/29/2013   Procedure: LEAD REVISION;  Surgeon: Duke Salvia, MD;  Location: [facility] CATH LAB;  Service: Cardiovascular;  Laterality: N/A;   TONSILLECTOMY     YAG LASER APPLICATION Left 12/27/2012   Procedure: YAG LASER APPLICATION;  Surgeon: Loraine Leriche T. Nile Riggs, MD;  Location: [facility] ORS;  Service: Ophthalmology;  Laterality: Left;   YAG LASER APPLICATION Right 01/10/2013   Procedure: YAG LASER APPLICATION;  Surgeon: Loraine Leriche T. Nile Riggs, MD;  Location: [facility] ORS;  Service: Ophthalmology;  Laterality: Right;     A IV Location/Drains/Wounds Patient Lines/Drains/Airways Status     Active Line/Drains/Airways     Name Placement date Placement time Site Days    Peripheral IV 03/16/23 20 G 1" Left Antecubital 03/16/23  1358  Antecubital  less than 1            Intake/Output Last 24 hours No intake or output data in the 24 hours ending 03/16/23 2147  Labs/Imaging Results for orders placed or performed during the hospital encounter of 03/16/23 (from the past 48 hour(s))  CBC with Differential     Status: Abnormal   Collection Time: 03/16/23  2:00 PM  Result Value Ref Range   WBC 10.3 4.0 - 10.5 K/uL   RBC 3.73 (L) 3.87 - 5.11 MIL/uL   Hemoglobin 12.1 12.0 - 15.0 g/dL   HCT 53.6 64.4 - 03.4 %   MCV 100.0 80.0 - 100.0 fL   MCH 32.4 26.0 - 34.0 pg   MCHC 32.4 30.0 - 36.0 g/dL   RDW 74.2 59.5 - 63.8 %   Platelets 186 150 - 400 K/uL   nRBC 0.0 0.0 - 0.2 %   Neutrophils Relative % 57 %   Neutro Abs 6.0 1.7 - 7.7 K/uL   Lymphocytes Relative 27 %   Lymphs Abs 2.7 0.7 - 4.0 K/uL   Monocytes Relative 12 %   Monocytes Absolute 1.2 (H) 0.1 - 1.0 K/uL   Eosinophils Relative 3 %   Eosinophils Absolute 0.3 0.0 - 0.5 K/uL   Basophils Relative 1 %   Basophils Absolute 0.1 0.0 - 0.1 K/uL   Immature Granulocytes 0 %   Abs Immature Granulocytes 0.04 0.00 - 0.07 K/uL    Comment: Performed at [facility], [address], [place], [place] [address]  Basic metabolic panel     Status: Abnormal   Collection Time: 03/16/23  2:00 PM  Result Value Ref Range   Sodium 134 (L) 135 - 145 mmol/L   Potassium 3.5 3.5 - 5.1 mmol/L   Chloride 96 (L) 98 - 111 mmol/L   CO2 27 22 - 32 mmol/L   Glucose, Bld 134 (H) 70 - 99 mg/dL    Comment: Glucose reference range applies only to samples taken after fasting for at least 8 hours.   BUN 45 (H) 8 - 23 mg/dL   Creatinine, Ser 3.29 (H) 0.44 - 1.00 mg/dL   Calcium 9.2 8.9 - 51.8 mg/dL   GFR, Estimated 28 (L) >60 mL/min    Comment: (NOTE) Calculated using the CKD-EPI Creatinine Equation (2021)    Anion gap 11 5 - 15    Comment: Performed at [facility], [address], [place], [place] [address]  Troponin I (High  Sensitivity)     Status: Abnormal   Collection Time: 03/16/23  2:00 PM  Result Value Ref Range   Troponin I (High Sensitivity) 34 (H) <18 ng/L    Comment: (NOTE) Elevated high sensitivity troponin I (hsTnI) values and significant  changes across serial measurements may suggest ACS but many other  chronic and acute conditions are known to elevate hsTnI results.  Refer to the "Links" section for chest pain algorithms and additional  guidance. Performed at [facility], [address] [place], [place] [address]   CK     Status:  None   Collection Time: 03/16/23  2:00 PM  Result Value Ref Range   Total CK 44 38 - 234 U/L    Comment: Performed at [facility], [address] [place], [place] [address]  Troponin I (High Sensitivity)     Status: Abnormal   Collection Time: 03/16/23  5:30 PM  Result Value Ref Range   Troponin I (High Sensitivity) 180 (HH) <18 ng/L    Comment: CRITICAL RESULT CALLED TO, READ BACK BY AND VERIFIED WITH E. TURNBOW, RN AT 6045 11.26.24 D. BLU (NOTE) Elevated high sensitivity troponin I (hsTnI) values and significant  changes across serial measurements may suggest ACS but many other  chronic and acute conditions are known to elevate hsTnI results.  Refer to the "Links" section for chest pain algorithms and additional  guidance. Performed at [facility], [address] [place], [place] [address]    *Note: Due to a large number of results and/or encounters for the requested time period, some results have not been displayed. A complete set of results can be found in Results Review.   CT Cervical Spine Wo Contrast  Result Date: 03/16/2023 CLINICAL DATA:  Syncope EXAM: CT CERVICAL SPINE WITHOUT CONTRAST TECHNIQUE: Multidetector CT imaging of the cervical spine was performed without intravenous contrast. Multiplanar CT image reconstructions were also generated. RADIATION DOSE REDUCTION: This exam was performed according to the departmental dose-optimization  program which includes automated exposure control, adjustment of the mA and/or kV according to patient size and/or use of iterative reconstruction technique. COMPARISON:  CT cervical spine 02/15/2018 FINDINGS: Alignment: Normal. Skull base and vertebrae: No acute fracture. No primary bone lesion or focal pathologic process. Soft tissues and spinal canal: No prevertebral fluid or swelling. No visible canal hematoma. Disc levels: There is disc space narrowing throughout the cervical spine most significant at C3-C4 and C4-C5 compatible with degenerative change. There is no severe central canal or neural foraminal stenosis at any level. Upper chest: There are small bilateral pleural effusions. Other: Left-sided pacemaker is present. IMPRESSION: 1. No acute fracture or traumatic subluxation of the cervical spine. 2. Small bilateral pleural effusions. Electronically Signed   By: Darliss Cheney M.D.   On: 03/16/2023 18:49   CT Head Wo Contrast  Result Date: 03/16/2023 CLINICAL DATA:  Trauma syncope EXAM: CT HEAD WITHOUT CONTRAST TECHNIQUE: Contiguous axial images were obtained from the base of the skull through the vertex without intravenous contrast. RADIATION DOSE REDUCTION: This exam was performed according to the departmental dose-optimization program which includes automated exposure control, adjustment of the mA and/or kV according to patient size and/or use of iterative reconstruction technique. COMPARISON:  CT brain 03/26/2018 FINDINGS: Brain: No acute territorial infarction, hemorrhage or intracranial mass. Atrophy and chronic small vessel ischemic changes of the white matter. Nonenlarged ventricles Vascular: No hyperdense vessels.  Carotid vascular calcification Skull: Normal. Negative for fracture or focal lesion. Sinuses/Orbits: No acute finding. Other: Moderate right forehead scalp hematoma IMPRESSION: 1. No CT evidence for acute intracranial abnormality. 2. Atrophy and chronic small vessel ischemic  changes of the white matter. 3. Moderate right forehead scalp hematoma. Electronically Signed   By: Jasmine Pang M.D.   On: 03/16/2023 18:48   DG Knee Complete 4 Views Right  Result Date: 03/16/2023 CLINICAL DATA:  Fall at rehab today. EXAM: RIGHT KNEE - COMPLETE 4+ VIEW COMPARISON:  Right knee radiographs 08/05/2017 FINDINGS: There is diffuse decreased bone mineralization. Mild medial and lateral compartment joint space narrowing. Mild to moderate medial and  lateral compartment chondrocalcinosis. Mild patellofemoral joint space narrowing and peripheral osteophytosis. No joint effusion. Moderate to high-grade atherosclerotic calcifications. Surgical clips overlie the posteromedial distal thigh and proximal calf. IMPRESSION: 1. Mild tricompartmental osteoarthritis. 2. Mild to moderate medial and lateral compartment chondrocalcinosis. 3. No acute fracture. Electronically Signed   By: Neita Garnet M.D.   On: 03/16/2023 18:46   DG Knee Complete 4 Views Left  Result Date: 03/16/2023 CLINICAL DATA:  Fall. EXAM: LEFT KNEE - COMPLETE 4+ VIEW COMPARISON:  None Available. FINDINGS: There is mildly decreased bone mineralization. Mild-to-moderate medial compartment joint space narrowing and peripheral osteophytosis. Mild patellofemoral joint space narrowing and peripheral osteophytosis. Mild-to-moderate medial and lateral compartment chondrocalcinosis. No joint effusion. No acute fracture or dislocation. Moderate to high-grade atherosclerotic calcifications. IMPRESSION: 1. Mild-to-moderate medial and mild patellofemoral compartment osteoarthritis. 2. Mild-to-moderate medial and lateral compartment chondrocalcinosis. Electronically Signed   By: Neita Garnet M.D.   On: 03/16/2023 18:44   DG Chest Portable 1 View  Result Date: 03/16/2023 CLINICAL DATA:  Syncope. EXAM: PORTABLE CHEST 1 VIEW COMPARISON:  08/23/2022. FINDINGS: There is mild pulmonary vascular congestion. Bilateral small pleural effusions, decreased  on the left side and increased on the right side. Redemonstration of right apical pleural cap. Bilateral lung fields are otherwise clear. No pneumothorax. Stable cardio-mediastinal silhouette. There are surgical staples along the heart border and sternotomy wires, status post CABG (coronary artery bypass graft). There is a left sided 3-lead pacemaker. No acute osseous abnormalities. The soft tissues are within normal limits. IMPRESSION: *Findings favor mild congestive heart failure/pulmonary edema. Electronically Signed   By: Jules Schick M.D.   On: 03/16/2023 16:11    Pending Labs Unresulted Labs (From admission, onward)    None       Vitals/Pain Today's Vitals   03/16/23 1745 03/16/23 1915 03/16/23 1923 03/16/23 1927  BP: 137/65 (!) 127/47    Pulse: 64 61    Resp: (!) 26 20    Temp:   98.7 F (37.1 C)   TempSrc:   Oral   SpO2: 100% 100%    Weight:      Height:      PainSc:    7     Isolation Precautions No active isolations  Medications Medications  Tdap (BOOSTRIX) injection 0.5 mL (0.5 mLs Intramuscular Given 03/16/23 1405)  acetaminophen (TYLENOL) tablet 650 mg (650 mg Oral Given 03/16/23 1722)    Mobility walks with device     Focused Assessments     R Recommendations: See Admitting Provider Note  Report given to:   Additional Notes:

## 2023-03-16 NOTE — ED Notes (Signed)
Pt label placed on pt specific bag with shirt in it and red assistive walker/chair

## 2023-03-16 NOTE — ED Triage Notes (Signed)
Pt brought over by cardiac rehab for a syncopal episode. Pt has large hematoma to left upper forehead. Pt is on blood thinners.

## 2023-03-16 NOTE — ED Notes (Signed)
C-collar applied to pt.

## 2023-03-16 NOTE — ED Provider Notes (Signed)
Department of Emergency Medicine CODE BLUE CONSULTATION    CONSULT NOTE  Chief Complaint: Unresponsive  History of present illness: I was contacted by the hospital for a CODE BLUE and presented to the patient's bedside.   She was at cardiac rehab, she is about to start her rehab session when patient fell to the ground had LOC.  No seizure activity reported by bystanders.  CPR was not performed by bystander.  She regained consciousness shortly after the fall.  She has obvious injury to her face, she is anticoagulated with Eliquis, last dose this morning.  She is on nasal cannula 3 L at all times. She has a headache, facial pain.  She denies prodrome prior to the fall but does report that she has been feeling weak and tired the last 48 hours.  Denies palpitations or chest pain preceding or following the fall.  Spoke with daughter at bedside.  ROS: +headache, +wound, +fatigue, +syncope -cp, dib, n/v, numbness  Scheduled Meds: Continuous Infusions: PRN Meds:. Past Medical History:  Diagnosis Date   Anxiety    Asthma    Chronic atrial fibrillation (HCC)    Chronic back pain    Chronic diastolic heart failure (HCC)    Chronic obstructive pulmonary disease, unspecified (HCC)    Complete heart block (HCC)    COPD (chronic obstructive pulmonary disease) (HCC)    Coronary atherosclerosis of native coronary artery    a. s/p CABG in 1998 with LIMA-LAD, SVG-Diagonal, SVG-RI-OM b. DES to PLA in 2006   DDD (degenerative disc disease), lumbar    Essential hypertension    Headache    ICD (implantable cardioverter-defibrillator) in place    Mixed hyperlipidemia    Osteoarthritis    Ventricular fibrillation (HCC) 2003   a. seen on PPM interrogation a/w syncope   Past Surgical History:  Procedure Laterality Date   CARDIAC DEFIBRILLATOR PLACEMENT     MDT dual chamber ICD   CARDIOVERSION N/A 08/09/2014   Procedure: CARDIOVERSION;  Surgeon: Thurmon Fair, MD;  Location: [facility] ENDOS[facility];   Service: Cardiovascular;  Laterality: N/A;   CORONARY ARTERY BYPASS GRAFT     LIMA to LAD, SVG to diagonal, SVG to ramus and OM   ICD GENERATOR CHANGEOUT N/A 08/06/2021   Procedure: ICD GENERATOR CHANGEOUT;  Surgeon: Marinus Maw, MD;  Location: [facility] INVASIVE CV LAB;  Service: Cardiovascular;  Laterality: N/A;   IMPLANTABLE CARDIOVERTER DEFIBRILLATOR GENERATOR CHANGE N/A 09/25/2013   Procedure: IMPLANTABLE CARDIOVERTER DEFIBRILLATOR GENERATOR CHANGE;  Surgeon: Marinus Maw, MD;  Location: [facility] CATH LAB;  Service: Cardiovascular;  Laterality: N/A;   LEAD REVISION N/A 09/29/2013   Procedure: LEAD REVISION;  Surgeon: Duke Salvia, MD;  Location: [facility] CATH LAB;  Service: Cardiovascular;  Laterality: N/A;   TONSILLECTOMY     YAG LASER APPLICATION Left 12/27/2012   Procedure: YAG LASER APPLICATION;  Surgeon: Loraine Leriche T. Nile Riggs, MD;  Location: [facility];  Service: Ophthalmology;  Laterality: Left;   YAG LASER APPLICATION Right 01/10/2013   Procedure: YAG LASER APPLICATION;  Surgeon: Loraine Leriche T. Nile Riggs, MD;  Location: [facility];  Service: Ophthalmology;  Laterality: Right;   Social History   Socioeconomic History   Marital status: Widowed    Spouse name: Not on file   Number of children: 2   Years of education: Not on file   Highest education level: Not on file  Occupational History   Occupation: Retired    Comment: Copywriter, advertising: RETIRED  Tobacco Use  Smoking status: Never   Smokeless tobacco: Never  Vaping Use   Vaping status: Never Used  Substance and Sexual Activity   Alcohol use: No    Alcohol/week: 0.0 standard drinks of alcohol   Drug use: No   Sexual activity: Never  Other Topics Concern   Not on file  Social History Narrative   Not on file   Social Determinants of Health   Financial Resource Strain: Not on file  Food Insecurity: No Food Insecurity (08/18/2022)   Hunger Vital Sign    Worried About Running Out of Food in the Last Year: Never true    Ran Out of Food in the  Last Year: Never true  Transportation Needs: No Transportation Needs (08/18/2022)   PRAPARE - Administrator, Civil Service (Medical): No    Lack of Transportation (Non-Medical): No  Physical Activity: Not on file  Stress: Not on file  Social Connections: Not on file  Intimate Partner Violence: Not At Risk (08/18/2022)   Humiliation, Afraid, Rape, and Kick questionnaire    Fear of Current or Ex-Partner: No    Emotionally Abused: No    Physically Abused: No    Sexually Abused: No   Allergies  Allergen Reactions   Codeine Nausea And Vomiting   Fentanyl Itching    Itching, redness to scalp and neck   Keflex [Cephalexin] Itching and Rash    Last set of Vital Signs (not current) Vitals:   03/16/23 1342 03/16/23 1345  BP: (!) 176/92 97/82  Pulse: 62 67  Resp: (!) 25 (!) 27  SpO2: 95% 98%      Physical Exam Gen: No acute distress Cardiovascular: A-fib Resp: apneic. Breath sounds equal bilaterally  Abd: nondistended  Neuro: GCS 15, following commands HEENT: Hematoma to forehead, nose bleed, no septal hematoma Neck: No crepitus  Musculoskeletal: No deformity  Skin: warm   Procedures    Medical Decision making  Fall on thinners with obvious head injury, anticoagulated > Send to the ER for evaluation, CT  Syncope > EKG, cardiac enzymes, chest x-ray, cardiac monitor  Send to ED for workup, escorted by ED by myself and rapid response nurse/charge nurse.     Sloan Leiter, DO 03/16/23 1355

## 2023-03-16 NOTE — Progress Notes (Signed)
Incomplete Session Note  Patient Details  Name: [name] [name] MRN: [id number] Date of Birth: May 01, 1931 Referring Provider:   Flowsheet Row PULMONARY REHAB OTHER RESP ORIENTATION from 12/17/2022 in [facility] CARDIAC REHABILITATION  Referring Provider Wallie Char MD       Roanna Banning did not complete her rehab session.  Caprice was moving from her walker to her chair and passed out down to floor.  She hit her head and nose.  Code Blue was called, she had lost consciousness but never stopped breathing or loss of pulse noticed.  She came back to and attempted to get up on own.  Staff assisted as [employer] arrived.  Team took over and transported to ED for CT of head.

## 2023-03-16 NOTE — ED Notes (Signed)
Alex RN verified with floor. Patient is ready to come upstairs

## 2023-03-16 NOTE — Consult Note (Signed)
Cardiology Consultation   Patient ID: [name] [name] MRN: [id number]; DOB: [birth year]  Admit date: 03/16/2023 Date of Consult: 03/17/2023  PCP:  [name], MD   [facility] Providers Cardiologist:  Nona Dell, MD  Electrophysiologist:  Lewayne Bunting, MD       Patient Profile:   [name] [name] is a 89 y.o. female with a hx of CHB, VT s/p Medtronic DC ICD, CAD s/p CABG in 1998 (LIMA-LAD, SVG-Diag, SVG-RI-OM + DES to PLA in 2006), pulmonary HTN, HFpEF, pA on Eliquis, HTN, CKD stage III who is being seen 03/17/2023 for the evaluation of ICD shock at the request of the Emergency Department.  History of Present Illness:   [name] [name] was at cardiac rehab on 11/26 when she suddenly collapsed on the ground.  She states she did not had a prodromal symptoms but did lose consciousness briefly.  After the incident she remembers being on the ground.  This prompted ED admission.  Upon interrogation of her device, she was found to have an ICD shock.  Patient states she has previously been shocked by her ICD "a long time ago".    [name] [name] has an extensive cardiovascular history.  She has a history of VT that occurred sometime after her CABG. She states she does not remember being shocked but did have similar loss of consciousness.  She states she has been feeling unwell since this past Christmas.  She states she has been progressively more fatigued and has had more difficulty ambulating.  She has had several episodes of volume overload in the setting of heart failure with a preserved ejection fraction.  She is on 3 L oxygen for end-stage COPD.  Recent FEV1 51%.    Her most recent echocardiogram in March 2024 showed an EF of 50% with moderate right ventricular dysfunction elevated PA systolic pressure and severe TR with mild to moderate MR.  Per chart review, she had a SPECT study performed in June 2021 which did not show any perfusion defects.  She also has atrial fibrillation that has been  managed with Tikosyn.  She recently began following Dr.Bensimhon for evaluation of pulmonary hypertension which could be contributing to her overall clinical picture.  Recent vitals include afebrile, blood pressure 150/49, heart rate of 58, SpO2 99% on 3 L nasal cannula.  Notable labs include high-sensitivity troponin 34-> 180, K of 3.5, serum creatinine 1.71, GFR 28, chest x-ray showed mild pulmonary edema.  ICD settings: ERI 4.66yrs, DDDR 60/130, PAV/SAV 180/1104ms, AT/AF <0.1%,    Past Medical History:  Diagnosis Date   Anxiety    Asthma    Chronic atrial fibrillation (HCC)    Chronic back pain    Chronic diastolic heart failure (HCC)    Chronic obstructive pulmonary disease, unspecified (HCC)    Complete heart block (HCC)    COPD (chronic obstructive pulmonary disease) (HCC)    Coronary atherosclerosis of native coronary artery    a. s/p CABG in 1998 with LIMA-LAD, SVG-Diagonal, SVG-RI-OM b. DES to PLA in 2006   DDD (degenerative disc disease), lumbar    Essential hypertension    Headache    ICD (implantable cardioverter-defibrillator) in place    Mixed hyperlipidemia    Osteoarthritis    Ventricular fibrillation (HCC) 2003   a. seen on PPM interrogation a/w syncope    Past Surgical History:  Procedure Laterality Date   CARDIAC DEFIBRILLATOR PLACEMENT     MDT dual chamber ICD   CARDIOVERSION N/A 08/09/2014   Procedure:  CARDIOVERSION;  Surgeon: Thurmon Fair, MD;  Location: [facility] ENDOSCOPY;  Service: Cardiovascular;  Laterality: N/A;   CORONARY ARTERY BYPASS GRAFT     LIMA to LAD, SVG to diagonal, SVG to ramus and OM   ICD GENERATOR CHANGEOUT N/A 08/06/2021   Procedure: ICD GENERATOR CHANGEOUT;  Surgeon: Marinus Maw, MD;  Location: [facility] INVASIVE CV LAB;  Service: Cardiovascular;  Laterality: N/A;   IMPLANTABLE CARDIOVERTER DEFIBRILLATOR GENERATOR CHANGE N/A 09/25/2013   Procedure: IMPLANTABLE CARDIOVERTER DEFIBRILLATOR GENERATOR CHANGE;  Surgeon: Marinus Maw, MD;  Location: [facility]  CATH LAB;  Service: Cardiovascular;  Laterality: N/A;   LEAD REVISION N/A 09/29/2013   Procedure: LEAD REVISION;  Surgeon: Duke Salvia, MD;  Location: [facility] CATH LAB;  Service: Cardiovascular;  Laterality: N/A;   TONSILLECTOMY     YAG LASER APPLICATION Left 12/27/2012   Procedure: YAG LASER APPLICATION;  Surgeon: Loraine Leriche T. Nile Riggs, MD;  Location: [facility] ORS;  Service: Ophthalmology;  Laterality: Left;   YAG LASER APPLICATION Right 01/10/2013   Procedure: YAG LASER APPLICATION;  Surgeon: Loraine Leriche T. Nile Riggs, MD;  Location: [facility] ORS;  Service: Ophthalmology;  Laterality: Right;     Home Medications:  Prior to Admission medications   Medication Sig Start Date End Date Taking? Authorizing Provider  acetaminophen (TYLENOL) 325 MG tablet Take 2 tablets (650 mg total) by mouth every 6 (six) hours as needed for mild pain (or Fever >/= 101). 08/23/22   Shon Hale, MD  albuterol (VENTOLIN HFA) 108 (90 Base) MCG/ACT inhaler Inhale 2 puffs into the lungs every 6 (six) hours as needed for wheezing or shortness of breath. 08/23/22   Shon Hale, MD  apixaban (ELIQUIS) 2.5 MG TABS tablet Take 1 tablet (2.5 mg total) by mouth 2 (two) times daily. 08/23/22   Shon Hale, MD  atorvastatin (LIPITOR) 20 MG tablet Take 1 tablet (20 mg total) by mouth daily. 08/23/22   Shon Hale, MD  Calcium Carbonate-Vitamin D (CALCIUM 600 + D PO) Take 1 tablet by mouth 2 (two) times daily.    [provider]  dapagliflozin propanediol (FARXIGA) 10 MG TABS tablet Take 1 tablet (10 mg total) by mouth daily before breakfast. 08/27/22   Jonelle Sidle, MD  dofetilide (TIKOSYN) 125 MCG capsule Take 1 capsule (125 mcg total) by mouth 2 (two) times daily. 08/23/22   Shon Hale, MD  Fluticasone-Umeclidin-Vilant (TRELEGY ELLIPTA) 100-62.5-25 MCG/ACT AEPB Inhale 1 puff into the lungs daily. 08/23/22   Shon Hale, MD  folic acid (FOLVITE) 1 MG tablet Take 1 tablet (1 mg total) by mouth daily. 08/23/22   Shon Hale, MD   losartan (COZAAR) 25 MG tablet Take 1 tablet (25 mg total) by mouth daily. 03/09/23   Bensimhon, Bevelyn Buckles, MD  Polyethyl Glycol-Propyl Glycol (SYSTANE OP) Place 1 drop into both eyes daily as needed (Dry eyes).    [provider]  polyethylene glycol (MIRALAX / GLYCOLAX) 17 g packet Take 17 g by mouth daily as needed for mild constipation. 08/23/22   Shon Hale, MD  torsemide (DEMADEX) 100 MG tablet Take 1 tablet (100 mg total) by mouth daily. 03/10/23   Bensimhon, Bevelyn Buckles, MD  triamcinolone cream (KENALOG) 0.1 % Apply 1 application. topically daily as needed (itching).    [provider]    Inpatient Medications: Scheduled Meds:  atorvastatin  20 mg Oral Daily   fluticasone furoate-vilanterol  1 puff Inhalation Daily   And   umeclidinium bromide  1 puff Inhalation Daily   losartan  25 mg Oral  Daily   sodium chloride flush  3 mL Intravenous Q12H   torsemide  100 mg Oral Daily   Continuous Infusions:  PRN Meds: acetaminophen **OR** acetaminophen, albuterol, fentaNYL (SUBLIMAZE) injection, oxyCODONE, senna  Allergies:    Allergies  Allergen Reactions   Codeine Nausea And Vomiting   Fentanyl Itching    Itching, redness to scalp and neck   Keflex [Cephalexin] Itching and Rash    Social History:   Social History   Socioeconomic History   Marital status: Widowed    Spouse name: Not on file   Number of children: 2   Years of education: Not on file   Highest education level: Not on file  Occupational History   Occupation: Retired    Comment: Copywriter, advertising: RETIRED  Tobacco Use   Smoking status: Never   Smokeless tobacco: Never  Vaping Use   Vaping status: Never Used  Substance and Sexual Activity   Alcohol use: No    Alcohol/week: 0.0 standard drinks of alcohol   Drug use: No   Sexual activity: Never  Other Topics Concern   Not on file  Social History Narrative   Not on file   Social Determinants of Health   Financial  Resource Strain: Not on file  Food Insecurity: No Food Insecurity (03/16/2023)   Hunger Vital Sign    Worried About Running Out of Food in the Last Year: Never true    Ran Out of Food in the Last Year: Never true  Transportation Needs: No Transportation Needs (03/16/2023)   PRAPARE - Administrator, Civil Service (Medical): No    Lack of Transportation (Non-Medical): No  Physical Activity: Not on file  Stress: Not on file  Social Connections: Not on file  Intimate Partner Violence: Not At Risk (03/16/2023)   Humiliation, Afraid, Rape, and Kick questionnaire    Fear of Current or Ex-Partner: No    Emotionally Abused: No    Physically Abused: No    Sexually Abused: No    Family History:    Family History  Problem Relation Age of Onset   Heart attack Father    Heart attack Brother      ROS:  Please see the history of present illness.   All other ROS reviewed and negative.     Physical Exam/Data:   Vitals:   03/16/23 2045 03/16/23 2100 03/16/23 2200 03/16/23 2241  BP: (!) 142/54 (!) 150/49  132/69  Pulse: 78 (!) 58  62  Resp: 19 (!) 23  20  Temp:   (!) 97 F (36.1 C) 97.9 F (36.6 C)  TempSrc:    Oral  SpO2: 100% 100%  99%  Weight:      Height:       No intake or output data in the 24 hours ending 03/17/23 0213    03/16/2023    1:44 PM 03/09/2023    3:07 PM 01/25/2023   11:01 AM  Last 3 Weights  Weight (lbs) 162 lb 6.3 oz 162 lb 6.4 oz 161 lb  Weight (kg) 73.66 kg 73.664 kg 73.029 kg     Body mass index is 31.71 kg/m.  General:  Frail lady, in no distress HEENT: normal Neck: JVD 3cm above clavicle at 45 deg Vascular: No carotid bruits; Distal pulses 2+ bilaterally Cardiac:  AS-VP rhythm on telemetry normal S1, S2;  Lungs:  on  Abd: soft, nontender, no hepatomegaly  Ext: no edema Musculoskeletal:  No deformities, BUE and  BLE strength normal and equal Skin: warm and dry  Neuro:  CNs 2-12 intact, no focal abnormalities noted Psych:  Normal  affect   EKG:  The EKG was personally reviewed and demonstrates:  AS-VP Telemetry:  Telemetry was personally reviewed and demonstrates:  AS-VP rhythm     Laboratory Data:  High Sensitivity Troponin:   Recent Labs  Lab 03/16/23 1400 03/16/23 1730  TROPONINIHS 34* 180*     Chemistry Recent Labs  Lab 03/16/23 1400  NA 134*  K 3.5  CL 96*  CO2 27  GLUCOSE 134*  BUN 45*  CREATININE 1.71*  CALCIUM 9.2  GFRNONAA 28*  ANIONGAP 11    No results for input(s): "PROT", "ALBUMIN", "AST", "ALT", "ALKPHOS", "BILITOT" in the last 168 hours. Lipids No results for input(s): "CHOL", "TRIG", "HDL", "LABVLDL", "LDLCALC", "CHOLHDL" in the last 168 hours.  Hematology Recent Labs  Lab 03/16/23 1400  WBC 10.3  RBC 3.73*  HGB 12.1  HCT 37.3  MCV 100.0  MCH 32.4  MCHC 32.4  RDW 15.3  PLT 186   Thyroid No results for input(s): "TSH", "FREET4" in the last 168 hours.  BNPNo results for input(s): "BNP", "PROBNP" in the last 168 hours.  DDimer No results for input(s): "DDIMER" in the last 168 hours.  Relevant CV Studies:  TTE 07/08/2022  IMPRESSIONS     1. Left ventricular ejection fraction, by estimation, is 50 to 55%. Left  ventricular ejection fraction by 3D volume is 50 %. The left ventricle has  low normal function. The left ventricle has no regional wall motion  abnormalities. Left ventricular  diastolic function could not be evaluated.   2. Right ventricular systolic function is moderately reduced. The right  ventricular size is moderately enlarged. There is moderately elevated  pulmonary artery systolic pressure. The estimated right ventricular  systolic pressure is 59.1 mmHg.   3. Left atrial size was severely dilated.   4. Right atrial size was severely dilated.   5. The mitral valve is normal in structure. Mild to moderate mitral valve  regurgitation.   6. Tricuspid valve regurgitation is moderate to severe.   7. The aortic valve is tricuspid. There is mild  calcification of the  aortic valve. There is mild thickening of the aortic valve. Aortic valve  regurgitation is mild. Aortic valve sclerosis is present, with no evidence  of aortic valve stenosis.   8. The inferior vena cava is dilated in size with <50% respiratory  variability, suggesting right atrial pressure of 15 mmHg.   Comparison(s): Prior images reviewed side by side. Tricuspid regurgitation  is much worse and right ventricular dilation and systolic dysfunction is  now present.   FINDINGS   Left Ventricle: Left ventricular ejection fraction, by estimation, is 50  to 55%. Left ventricular ejection fraction by 3D volume is 50 %. The left  ventricle has low normal function. The left ventricle has no regional wall  motion abnormalities. The left  ventricular internal cavity size was normal in size. There is no left  ventricular hypertrophy. Abnormal (paradoxical) septal motion, consistent  with RV pacemaker. Left ventricular diastolic function could not be  evaluated due to atrial fibrillation. Left   ventricular diastolic function could not be evaluated.   Right Ventricle: The right ventricular size is moderately enlarged. No  increase in right ventricular wall thickness. Right ventricular systolic  function is moderately reduced. There is moderately elevated pulmonary  artery systolic pressure. The tricuspid   regurgitant velocity is 3.32 m/s, and  with an assumed right atrial  pressure of 15 mmHg, the estimated right ventricular systolic pressure is  59.1 mmHg.   Left Atrium: Left atrial size was severely dilated.   Right Atrium: Right atrial size was severely dilated.   Pericardium: There is no evidence of pericardial effusion.   Mitral Valve: The mitral valve is normal in structure. Mild to moderate  mitral valve regurgitation, with centrally-directed jet.   Tricuspid Valve: The tricuspid valve is not well visualized. Tricuspid  valve regurgitation is moderate to  severe.   Aortic Valve: The aortic valve is tricuspid. There is mild calcification  of the aortic valve. There is mild thickening of the aortic valve. Aortic  valve regurgitation is mild. Aortic valve sclerosis is present, with no  evidence of aortic valve stenosis.   Pulmonic Valve: The pulmonic valve was not well visualized. Pulmonic valve  regurgitation is mild. No evidence of pulmonic stenosis.   Aorta: The aortic root is normal in size and structure.   Venous: The inferior vena cava is dilated in size with less than 50%  respiratory variability, suggesting right atrial pressure of 15 mmHg. The  inferior vena cava and the hepatic vein show a pattern of systolic flow  reversal, suggestive of tricuspid  regurgitation.   IAS/Shunts: No atrial level shunt detected by color flow Doppler.   Additional Comments: A device lead is visualized.     LEFT VENTRICLE  PLAX 2D  LVIDd:         4.40 cm         Diastology  LVIDs:         3.00 cm         LV e' medial:  6.57 cm/s  LV PW:         1.00 cm         LV e' lateral: 8.67 cm/s  LV IVS:        1.00 cm  LVOT diam:     2.00 cm  LV SV:         77              3D Volume EF  LV SV Index:   44              LV 3D EF:    Left  LVOT Area:     3.14 cm                     ventricul                                              ar                                              ejection                                              fraction  by 3D                                              volume is                                              50 %.                                   3D Volume EF:                                 3D EF:        50 %                                 LV EDV:       70 ml                                 LV ESV:       35 ml                                 LV SV:        35 ml   RIGHT VENTRICLE  RV Basal diam:  5.00 cm  RV Mid diam:    4.20 cm  RV S prime:     8.50 cm/s   TAPSE (M-mode): 1.3 cm   LEFT ATRIUM             Index        RIGHT ATRIUM           Index  LA diam:        5.20 cm 3.00 cm/m   RA Area:     26.65 cm  LA Vol (A2C):   91.0 ml 52.47 ml/m  RA Volume:   93.30 ml  53.80 ml/m  LA Vol (A4C):   67.1 ml 38.69 ml/m  LA Biplane Vol: 79.8 ml 46.02 ml/m   AORTIC VALVE  LVOT Vmax:   98.50 cm/s  LVOT Vmean:  65.100 cm/s  LVOT VTI:    0.244 m    AORTA  Ao Root diam: 2.60 cm  Ao Asc diam:  3.00 cm   TRICUSPID VALVE  TR Peak grad:   44.1 mmHg  TR Vmax:        332.00 cm/s    SHUNTS  Systemic VTI:  0.24 m  Systemic Diam: 2.00 cm   [name] Hora Croitoru MD  Electronically signed by Thurmon Fair MD  Signature Date/Time: 07/08/2022/7:33:07 PM   Radiology/Studies:  CT Cervical Spine Wo Contrast  Result Date: 03/16/2023 CLINICAL DATA:  Syncope EXAM: CT CERVICAL SPINE WITHOUT CONTRAST TECHNIQUE: Multidetector CT imaging of the cervical spine was performed without intravenous contrast. Multiplanar CT image reconstructions were also generated. RADIATION DOSE REDUCTION: This exam was performed according to the departmental dose-optimization program which includes automated exposure control, adjustment of the  mA and/or kV according to patient size and/or use of iterative reconstruction technique. COMPARISON:  CT cervical spine 02/15/2018 FINDINGS: Alignment: Normal. Skull base and vertebrae: No acute fracture. No primary bone lesion or focal pathologic process. Soft tissues and spinal canal: No prevertebral fluid or swelling. No visible canal hematoma. Disc levels: There is disc space narrowing throughout the cervical spine most significant at C3-C4 and C4-C5 compatible with degenerative change. There is no severe central canal or neural foraminal stenosis at any level. Upper chest: There are small bilateral pleural effusions. Other: Left-sided pacemaker is present. IMPRESSION: 1. No acute fracture or traumatic subluxation of the cervical spine. 2. Small  bilateral pleural effusions. Electronically Signed   By: Darliss Cheney M.D.   On: 03/16/2023 18:49   CT Head Wo Contrast  Result Date: 03/16/2023 CLINICAL DATA:  Trauma syncope EXAM: CT HEAD WITHOUT CONTRAST TECHNIQUE: Contiguous axial images were obtained from the base of the skull through the vertex without intravenous contrast. RADIATION DOSE REDUCTION: This exam was performed according to the departmental dose-optimization program which includes automated exposure control, adjustment of the mA and/or kV according to patient size and/or use of iterative reconstruction technique. COMPARISON:  CT brain 03/26/2018 FINDINGS: Brain: No acute territorial infarction, hemorrhage or intracranial mass. Atrophy and chronic small vessel ischemic changes of the white matter. Nonenlarged ventricles Vascular: No hyperdense vessels.  Carotid vascular calcification Skull: Normal. Negative for fracture or focal lesion. Sinuses/Orbits: No acute finding. Other: Moderate right forehead scalp hematoma IMPRESSION: 1. No CT evidence for acute intracranial abnormality. 2. Atrophy and chronic small vessel ischemic changes of the white matter. 3. Moderate right forehead scalp hematoma. Electronically Signed   By: Jasmine Pang M.D.   On: 03/16/2023 18:48   DG Knee Complete 4 Views Right  Result Date: 03/16/2023 CLINICAL DATA:  Fall at rehab today. EXAM: RIGHT KNEE - COMPLETE 4+ VIEW COMPARISON:  Right knee radiographs 08/05/2017 FINDINGS: There is diffuse decreased bone mineralization. Mild medial and lateral compartment joint space narrowing. Mild to moderate medial and lateral compartment chondrocalcinosis. Mild patellofemoral joint space narrowing and peripheral osteophytosis. No joint effusion. Moderate to high-grade atherosclerotic calcifications. Surgical clips overlie the posteromedial distal thigh and proximal calf. IMPRESSION: 1. Mild tricompartmental osteoarthritis. 2. Mild to moderate medial and lateral compartment  chondrocalcinosis. 3. No acute fracture. Electronically Signed   By: Neita Garnet M.D.   On: 03/16/2023 18:46   DG Knee Complete 4 Views Left  Result Date: 03/16/2023 CLINICAL DATA:  Fall. EXAM: LEFT KNEE - COMPLETE 4+ VIEW COMPARISON:  None Available. FINDINGS: There is mildly decreased bone mineralization. Mild-to-moderate medial compartment joint space narrowing and peripheral osteophytosis. Mild patellofemoral joint space narrowing and peripheral osteophytosis. Mild-to-moderate medial and lateral compartment chondrocalcinosis. No joint effusion. No acute fracture or dislocation. Moderate to high-grade atherosclerotic calcifications. IMPRESSION: 1. Mild-to-moderate medial and mild patellofemoral compartment osteoarthritis. 2. Mild-to-moderate medial and lateral compartment chondrocalcinosis. Electronically Signed   By: Neita Garnet M.D.   On: 03/16/2023 18:44   DG Chest Portable 1 View  Result Date: 03/16/2023 CLINICAL DATA:  Syncope. EXAM: PORTABLE CHEST 1 VIEW COMPARISON:  08/23/2022. FINDINGS: There is mild pulmonary vascular congestion. Bilateral small pleural effusions, decreased on the left side and increased on the right side. Redemonstration of right apical pleural cap. Bilateral lung fields are otherwise clear. No pneumothorax. Stable cardio-mediastinal silhouette. There are surgical staples along the heart border and sternotomy wires, status post CABG (coronary artery bypass graft). There is a left sided 3-lead pacemaker. No acute  osseous abnormalities. The soft tissues are within normal limits. IMPRESSION: *Findings favor mild congestive heart failure/pulmonary edema. Electronically Signed   By: Jules Schick M.D.   On: 03/16/2023 16:11     Assessment and Plan:   LAKIVA GRANTHAM is a 89 y.o. female with a hx of CHB, VT s/p Medtronic DC ICD, CAD s/p CABG in 1998 (LIMA-LAD, SVG-Diag, SVG-RI-OM + DES to PLA in 2006), pulmonary HTN, HFpEF, pA on Eliquis, HTN, CKD stage III who is being seen  03/16/2023 for the evaluation of ICD shock at the request of the Emergency Department.  #Concern for Torsades de Pointes On interrogation of [name] [name]'s device, [name] [name] was found to have 1-ICD shock. EGMs appears to be consistent with torsades  that was induced by PVC-mediated R on T phenomena  which quickly progressed to VF.  She has a prolonged QTc which is likely from [name] [name] use.  Of note, [name] [name] has shown evidence of multifocal PVCs.  She  had runs of NSVT that appeared to be right bundle branch morphology with superior axis concerning for site of origin at the anterior LV.  Given her extensive history of coronary artery disease and elevated troponin, these PVCs could be ischemic in nature or scar mediated.  She certainly warrants an ischemic evaluation, though she also has several other explanations for myocardial injury. I discussed potential catheterization with [name] [name], however due to the time of the discussion she wanted a further conversation to later.  For now, her lower rate was set to 60 bpm however I increased the rate to 75 bpm for PVC suppression.  Her PVCs have significantly improved throughout the night.  Would avoid amiodarone given her long QTc.  If she has further runs of sustained VT requiring ICD shock, we will further overdrive pace and consider transfer to ICU for lidocaine drip.  Overdrive pace at 75bpm Start heparin for ACS Discuss utility of LHC Avoid Qtc prolonging agents Replete K and Mg with goal of >4 and >2 respectively Consider diuresis for volume overload   Risk Assessment/Risk Scores:        New York Heart Association (NYHA) Functional Class NYHA Class IV  CHA2DS2-VASc Score = 6   This indicates a 9.7% annual risk of stroke. The patient's score is based upon: CHF History: 1 HTN History: 1 Diabetes History: 0 Stroke History: 0 Vascular Disease History: 1 Age Score: 2 Gender Score: 1         For questions or updates, please contact [facility] Please consult [email or web address] for contact info under    Signed, Donavan Burnet, MD  03/17/2023 2:13 AM

## 2023-03-16 NOTE — ED Notes (Signed)
Pt has a moderately large hematoma on forehead from fall, bleeding controlled at this time--However, there is an open area on hematoma and nose. Pts nose appears swollen as well. Pt is A&Ox 4 at this time and family member at bedside with pt

## 2023-03-16 NOTE — ED Notes (Signed)
Pt has a defibrillator.

## 2023-03-17 ENCOUNTER — Inpatient Hospital Stay (HOSPITAL_COMMUNITY): Payer: Medicare PPO

## 2023-03-17 DIAGNOSIS — Z4502 Encounter for adjustment and management of automatic implantable cardiac defibrillator: Secondary | ICD-10-CM

## 2023-03-17 DIAGNOSIS — R55 Syncope and collapse: Secondary | ICD-10-CM | POA: Diagnosis not present

## 2023-03-17 DIAGNOSIS — S065XAA Traumatic subdural hemorrhage with loss of consciousness status unknown, initial encounter: Secondary | ICD-10-CM | POA: Insufficient documentation

## 2023-03-17 DIAGNOSIS — I499 Cardiac arrhythmia, unspecified: Secondary | ICD-10-CM | POA: Diagnosis not present

## 2023-03-17 DIAGNOSIS — S022XXA Fracture of nasal bones, initial encounter for closed fracture: Secondary | ICD-10-CM | POA: Insufficient documentation

## 2023-03-17 LAB — MAGNESIUM: Magnesium: 2.5 mg/dL — ABNORMAL HIGH (ref 1.7–2.4)

## 2023-03-17 LAB — TROPONIN I (HIGH SENSITIVITY): Troponin I (High Sensitivity): 243 ng/L (ref <18)

## 2023-03-17 LAB — BASIC METABOLIC PANEL
Anion gap: 14 (ref 5–15)
BUN: 40 mg/dL — ABNORMAL HIGH (ref 8–23)
CO2: 27 mmol/L (ref 22–32)
Calcium: 9.2 mg/dL (ref 8.9–10.3)
Chloride: 100 mmol/L (ref 98–111)
Creatinine, Ser: 1.65 mg/dL — ABNORMAL HIGH (ref 0.44–1.00)
GFR, Estimated: 30 mL/min — ABNORMAL LOW (ref >60)
Glucose, Bld: 104 mg/dL — ABNORMAL HIGH (ref 70–99)
Potassium: 3.5 mmol/L (ref 3.5–5.1)
Sodium: 141 mmol/L (ref 135–145)

## 2023-03-17 LAB — CBC
HCT: 33.1 % — ABNORMAL LOW (ref 36.0–46.0)
Hemoglobin: 10.9 g/dL — ABNORMAL LOW (ref 12.0–15.0)
MCH: 32.4 pg (ref 26.0–34.0)
MCHC: 32.9 g/dL (ref 30.0–36.0)
MCV: 98.5 fL (ref 80.0–100.0)
Platelets: 160 10*3/uL (ref 150–400)
RBC: 3.36 MIL/uL — ABNORMAL LOW (ref 3.87–5.11)
RDW: 15.2 % (ref 11.5–15.5)
WBC: 9.8 10*3/uL (ref 4.0–10.5)
nRBC: 0 % (ref 0.0–0.2)

## 2023-03-17 MED ORDER — AMIODARONE HCL 200 MG PO TABS: 200.0000 mg | ORAL_TABLET | Freq: Two times a day (BID) | ORAL | Status: DC

## 2023-03-17 MED ORDER — POTASSIUM CHLORIDE CRYS ER 20 MEQ PO TBCR
20.0000 meq | EXTENDED_RELEASE_TABLET | Freq: Once | ORAL | Status: AC
  Administered 2023-03-17: 20 meq via ORAL
  Filled 2023-03-17: qty 1

## 2023-03-17 MED ORDER — ATORVASTATIN CALCIUM 10 MG PO TABS
20.0000 mg | ORAL_TABLET | Freq: Every day | ORAL | Status: DC
  Administered 2023-03-17 – 2023-03-22 (×6): 20 mg via ORAL
  Filled 2023-03-17 (×7): qty 2

## 2023-03-17 MED ORDER — HEPARIN (PORCINE) 25000 UT/250ML-% IV SOLN
850.0000 [IU]/h | INTRAVENOUS | Status: DC
  Administered 2023-03-17: 850 [IU]/h via INTRAVENOUS
  Filled 2023-03-17: qty 250

## 2023-03-17 MED ORDER — POTASSIUM CHLORIDE 10 MEQ/100ML IV SOLN
10.0000 meq | INTRAVENOUS | Status: DC
  Filled 2023-03-17: qty 100

## 2023-03-17 NOTE — Progress Notes (Signed)
PROGRESS NOTE    [name] [name]  VHQ:[id number] DOB: [birth year] DOA: 03/16/2023 PCP: [name], MD     Brief Narrative:  [name] [name] is a 89 y.o. female with medical history significant for COPD, chronic respiratory failure, heart block with pacemaker, pulmonary hypertension, chronic HFpEF, atrial fibrillation on Eliquis, and CKD 4 who presents with scalp hematoma and epistaxis after a syncopal episode with fall.   Patient was at cardiac rehab and was transferring from her walker to a chair when she had a sudden loss of consciousness and fell, hitting her face and head.  She quickly regained awareness and had epistaxis and a frontal scalp hematoma.  She denies any recent chest pain.  She has not felt a shock from her cardiac device.  She reports mild dyspnea without cough, denies fever or chills, and notes that her bilateral lower extremity swelling is about the same as usual.   Upon interrogation of her ICD, was found that patient had V-fib arrest earlier and was defibrillated out of it.  Patient was admitted and cardiology consulted.  New events last 24 hours / Subjective: Patient with pain in her face as well as right lower extremity.  Assessment & Plan:   Principal Problem:   Syncope, cardiogenic Active Problems:   Coronary atherosclerosis of native coronary artery   CKD (chronic kidney disease) stage 4, GFR 15-29 ml/min (HCC)   Essential (primary) hypertension   Primary pulmonary hypertension (HCC)   Chronic atrial fibrillation (HCC)   Chronic obstructive pulmonary disease, unspecified (HCC)   Chronic respiratory failure with hypoxia (HCC)   Elevated troponin   Ventricular arrhythmia   Torsades, V-fib -ICD interrogation found patient had torsades de points, V-fib and received 1 shock -Amiodarone -Cardiology following  Left subdural hematoma -CT head: Minimal increase in size of left tentorial leaflet subdural hematoma, now measuring up to 3 mm, previously 2 mm. No new  sites of hemorrhage identified. -IV heparin currently on hold -Discussed with neurosurgery over the phone today.  No surgical interventions required.  Consider repeat head imaging if patient has any mental decline.  Ideally, patient would need to be off anticoagulation for 2 weeks.  However, considering her ACS and A-fib, could consider starting short acting anticoagulation such as IV heparin or Lovenox in 3 days and monitor closely  Bilateral nasal fractures -Allow swelling to go down, she will need outpatient ENT evaluation  A-fib -Tikosyn, Eliquis currently on hold  CAD NSTEMI -Troponin 34, 180, 243 -IV heparin currently on hold due to subdural hematoma -Lipitor  COPD with chronic hypoxic respiratory failure -Continue inhalers  Chronic diastolic CHF, pulmonary hypertension -BNP 747.5 -Torsemide, losartan  CKD stage IV -Baseline creatinine 2.63  DVT prophylaxis:  SCDs Start: 03/16/23 2155  Code Status: Full code Family Communication: Discussed with son today Disposition Plan: Pending further workup Status is: Inpatient Remains inpatient appropriate because: Further cardiac recommendation pending, she will eventually need PT OT prior to discharge    Antimicrobials:  Anti-infectives (From admission, onward)    None        Objective: Vitals:   03/17/23 0600 03/17/23 0738 03/17/23 0914 03/17/23 1144  BP:  (!) 146/64 (!) 142/52 106/72  Pulse:      Resp:  20    Temp:  98.1 F (36.7 C)  98.4 F (36.9 C)  TempSrc:  Oral  Oral  SpO2:      Weight: 73.5 kg     Height:        Intake/Output Summary (  Last 24 hours) at 03/17/2023 1151 Last data filed at 03/17/2023 1100 Gross per 24 hour  Intake --  Output 400 ml  Net -400 ml   Filed Weights   03/16/23 1344 03/17/23 0600  Weight: 73.7 kg 73.5 kg    Examination:  General exam: Appears calm and comfortable  Respiratory system: Clear to auscultation. Respiratory effort slightly increased, tachypneic without  distress Cardiovascular system: S1 & S2 heard, RRR Gastrointestinal system: Abdomen is nondistended, soft and nontender.  Central nervous system: Alert and oriented. No focal neurological deficits. Speech clear.  Extremities: Right lower extremity edema with significant bruising Skin: Facial bruising Psychiatry: Judgement and insight appear normal. Mood & affect appropriate.   Data Reviewed: I have personally reviewed following labs and imaging studies  CBC: Recent Labs  Lab 03/16/23 1400 03/17/23 0352  WBC 10.3 9.8  NEUTROABS 6.0  --   HGB 12.1 10.9*  HCT 37.3 33.1*  MCV 100.0 98.5  PLT 186 160   Basic Metabolic Panel: Recent Labs  Lab 03/16/23 1400 03/17/23 0352  NA 134* 141  K 3.5 3.5  CL 96* 100  CO2 27 27  GLUCOSE 134* 104*  BUN 45* 40*  CREATININE 1.71* 1.65*  CALCIUM 9.2 9.2  MG  --  2.5*   GFR: Estimated Creatinine Clearance: 20.7 mL/min (A) (by C-G formula based on SCr of 1.65 mg/dL (H)). Liver Function Tests: No results for input(s): "AST", "ALT", "ALKPHOS", "BILITOT", "PROT", "ALBUMIN" in the last 168 hours. No results for input(s): "LIPASE", "AMYLASE" in the last 168 hours. No results for input(s): "AMMONIA" in the last 168 hours. Coagulation Profile: No results for input(s): "INR", "PROTIME" in the last 168 hours. Cardiac Enzymes: Recent Labs  Lab 03/16/23 1400  CKTOTAL 44   BNP (last 3 results) No results for input(s): "PROBNP" in the last 8760 hours. HbA1C: No results for input(s): "HGBA1C" in the last 72 hours. CBG: No results for input(s): "GLUCAP" in the last 168 hours. Lipid Profile: No results for input(s): "CHOL", "HDL", "LDLCALC", "TRIG", "CHOLHDL", "LDLDIRECT" in the last 72 hours. Thyroid Function Tests: No results for input(s): "TSH", "T4TOTAL", "FREET4", "T3FREE", "THYROIDAB" in the last 72 hours. Anemia Panel: No results for input(s): "VITAMINB12", "FOLATE", "FERRITIN", "TIBC", "IRON", "RETICCTPCT" in the last 72 hours. Sepsis  Labs: No results for input(s): "PROCALCITON", "LATICACIDVEN" in the last 168 hours.  No results found for this or any previous visit (from the past 240 hour(s)).    Radiology Studies: CT HEAD WO CONTRAST ( )  Result Date: 03/17/2023 CLINICAL DATA:  Subdural hematoma new left subdural hematoma EXAM: CT HEAD WITHOUT CONTRAST TECHNIQUE: Contiguous axial images were obtained from the base of the skull through the vertex without intravenous contrast. RADIATION DOSE REDUCTION: This exam was performed according to the departmental dose-optimization program which includes automated exposure control, adjustment of the mA and/or kV according to patient size and/or use of iterative reconstruction technique. COMPARISON:  Head CT 03/16/23, CT Face 03/17/23 FINDINGS: Brain: Slightly increased size of the subdural hematoma along the left tentorial leaflet, now measuring up to 3 mm, previously 2 mm. No new sites of hemorrhage identified. No hydrocephalus. No extra-axial fluid collection. No CT evidence of an acute cortical infarct. No mass effect. No mass lesion. Vascular: No hyperdense vessel or unexpected calcification. Skull: Soft tissue swelling along the right frontal scalp extending inferiorly to the level of the nasal bridge. There may be a mildly displaced nasal bone fracture on the left (series 4, image 4). Sinuses/Orbits: No middle ear  or mastoid effusion. Mucosal thickening in the bilateral sphenoid sinuses. Bilateral lens replacement. Orbits are otherwise unremarkable. Other: None. IMPRESSION: Minimal increase in size of left tentorial leaflet subdural hematoma, now measuring up to 3 mm, previously 2 mm. No new sites of hemorrhage identified. Electronically Signed   By: Lorenza Cambridge M.D.   On: 03/17/2023 10:55   CT MAXILLOFACIAL WO CONTRAST  Addendum Date: 03/17/2023   ADDENDUM REPORT: 03/17/2023 09:23 ADDENDUM: Impression #1 called by telephone at the time of interpretation on 03/17/2023 at 9:20 am to  provider Noralee Stain, who verbally acknowledged these results. Electronically Signed   By: Jackey Loge D.O.   On: 03/17/2023 09:23   Result Date: 03/17/2023 CLINICAL DATA:  Provided history: Facial trauma, blunt. EXAM: CT MAXILLOFACIAL WITHOUT CONTRAST TECHNIQUE: Multidetector CT imaging of the maxillofacial structures was performed. Multiplanar CT image reconstructions were also generated. RADIATION DOSE REDUCTION: This exam was performed according to the departmental dose-optimization program which includes automated exposure control, adjustment of the mA and/or kV according to patient size and/or use of iterative reconstruction technique. COMPARISON:  Prior head CT examinations 03/16/2023 and earlier. Maxillofacial CT 08/29/2007. FINDINGS: Osseous: Displaced bilateral nasal bone fractures with overlying soft tissue swelling, likely acute. Chronic, depressed fracture deformity of the left orbital floor. Orbits: Chronic, depressed fracture deformity of the left orbital floor. No acute orbital finding. Sinuses: Trace mucosal thickening within the bilateral ethmoid sinuses. Moderate mucosal thickening within the left sphenoid sinus. Small-volume frothy secretions within the right sphenoid sinus. Soft tissues: Forehead and right frontotemporal scalp hematomas. Nasal soft tissue swelling. Limited intracranial: Thin acute subdural hematoma along the left tentorium (measuring up to 2 mm in thickness), new from the prior head CT of 03/16/2023. Attempts are being made to reach the ordering provider regarding impression #1 at this time. IMPRESSION: 1. Thin acute subdural hematoma along the left tentorium (measuring up to 2 mm in thickness), new from yesterday's head CT. 2. Displaced bilateral nasal bone fractures with overlying soft tissue swelling, likely acute. 3. Chronic, depressed fracture deformity of the left orbital floor. 4. Forehead and right frontotemporal scalp hematomas. 5. Paranasal sinus disease as  described. Electronically Signed: By: Jackey Loge D.O. On: 03/17/2023 09:01   CT Cervical Spine Wo Contrast  Result Date: 03/16/2023 CLINICAL DATA:  Syncope EXAM: CT CERVICAL SPINE WITHOUT CONTRAST TECHNIQUE: Multidetector CT imaging of the cervical spine was performed without intravenous contrast. Multiplanar CT image reconstructions were also generated. RADIATION DOSE REDUCTION: This exam was performed according to the departmental dose-optimization program which includes automated exposure control, adjustment of the mA and/or kV according to patient size and/or use of iterative reconstruction technique. COMPARISON:  CT cervical spine 02/15/2018 FINDINGS: Alignment: Normal. Skull base and vertebrae: No acute fracture. No primary bone lesion or focal pathologic process. Soft tissues and spinal canal: No prevertebral fluid or swelling. No visible canal hematoma. Disc levels: There is disc space narrowing throughout the cervical spine most significant at C3-C4 and C4-C5 compatible with degenerative change. There is no severe central canal or neural foraminal stenosis at any level. Upper chest: There are small bilateral pleural effusions. Other: Left-sided pacemaker is present. IMPRESSION: 1. No acute fracture or traumatic subluxation of the cervical spine. 2. Small bilateral pleural effusions. Electronically Signed   By: Darliss Cheney M.D.   On: 03/16/2023 18:49   CT Head Wo Contrast  Result Date: 03/16/2023 CLINICAL DATA:  Trauma syncope EXAM: CT HEAD WITHOUT CONTRAST TECHNIQUE: Contiguous axial images were obtained from the base of  the skull through the vertex without intravenous contrast. RADIATION DOSE REDUCTION: This exam was performed according to the departmental dose-optimization program which includes automated exposure control, adjustment of the mA and/or kV according to patient size and/or use of iterative reconstruction technique. COMPARISON:  CT brain 03/26/2018 FINDINGS: Brain: No acute  territorial infarction, hemorrhage or intracranial mass. Atrophy and chronic small vessel ischemic changes of the white matter. Nonenlarged ventricles Vascular: No hyperdense vessels.  Carotid vascular calcification Skull: Normal. Negative for fracture or focal lesion. Sinuses/Orbits: No acute finding. Other: Moderate right forehead scalp hematoma IMPRESSION: 1. No CT evidence for acute intracranial abnormality. 2. Atrophy and chronic small vessel ischemic changes of the white matter. 3. Moderate right forehead scalp hematoma. Electronically Signed   By: Jasmine Pang M.D.   On: 03/16/2023 18:48   DG Knee Complete 4 Views Right  Result Date: 03/16/2023 CLINICAL DATA:  Fall at rehab today. EXAM: RIGHT KNEE - COMPLETE 4+ VIEW COMPARISON:  Right knee radiographs 08/05/2017 FINDINGS: There is diffuse decreased bone mineralization. Mild medial and lateral compartment joint space narrowing. Mild to moderate medial and lateral compartment chondrocalcinosis. Mild patellofemoral joint space narrowing and peripheral osteophytosis. No joint effusion. Moderate to high-grade atherosclerotic calcifications. Surgical clips overlie the posteromedial distal thigh and proximal calf. IMPRESSION: 1. Mild tricompartmental osteoarthritis. 2. Mild to moderate medial and lateral compartment chondrocalcinosis. 3. No acute fracture. Electronically Signed   By: Neita Garnet M.D.   On: 03/16/2023 18:46   DG Knee Complete 4 Views Left  Result Date: 03/16/2023 CLINICAL DATA:  Fall. EXAM: LEFT KNEE - COMPLETE 4+ VIEW COMPARISON:  None Available. FINDINGS: There is mildly decreased bone mineralization. Mild-to-moderate medial compartment joint space narrowing and peripheral osteophytosis. Mild patellofemoral joint space narrowing and peripheral osteophytosis. Mild-to-moderate medial and lateral compartment chondrocalcinosis. No joint effusion. No acute fracture or dislocation. Moderate to high-grade atherosclerotic calcifications.  IMPRESSION: 1. Mild-to-moderate medial and mild patellofemoral compartment osteoarthritis. 2. Mild-to-moderate medial and lateral compartment chondrocalcinosis. Electronically Signed   By: Neita Garnet M.D.   On: 03/16/2023 18:44   DG Chest Portable 1 View  Result Date: 03/16/2023 CLINICAL DATA:  Syncope. EXAM: PORTABLE CHEST 1 VIEW COMPARISON:  08/23/2022. FINDINGS: There is mild pulmonary vascular congestion. Bilateral small pleural effusions, decreased on the left side and increased on the right side. Redemonstration of right apical pleural cap. Bilateral lung fields are otherwise clear. No pneumothorax. Stable cardio-mediastinal silhouette. There are surgical staples along the heart border and sternotomy wires, status post CABG (coronary artery bypass graft). There is a left sided 3-lead pacemaker. No acute osseous abnormalities. The soft tissues are within normal limits. IMPRESSION: *Findings favor mild congestive heart failure/pulmonary edema. Electronically Signed   By: Jules Schick M.D.   On: 03/16/2023 16:11      Scheduled Meds:  [START ON 03/18/2023] amiodarone  200 mg Oral BID   atorvastatin  20 mg Oral Daily   fluticasone furoate-vilanterol  1 puff Inhalation Daily   And   umeclidinium bromide  1 puff Inhalation Daily   losartan  25 mg Oral Daily   sodium chloride flush  3 mL Intravenous Q12H   torsemide  100 mg Oral Daily   Continuous Infusions:   LOS: 1 day   Time spent: 45 minutes   Noralee Stain, DO [facility] 03/17/2023, 11:51 AM   Available via Epic secure chat 7am-7pm After these hours, please refer to coverage provider listed on [email or web address]

## 2023-03-17 NOTE — Progress Notes (Signed)
Patient is refusing heparin gtt and potassium runs.  She states that she does not feel comfortable doing any of that without "my doctor doing it"    Educated the patient on the importance of the heparin and potassium and why the cardiologist feels she needs it.  Patient is also refusing pain meds.  She states "I don't want anything that will make me sleepy."  Warm compresses applied to patients rt knee for pain control.  Dr. Antionette Char notified.

## 2023-03-17 NOTE — TOC Initial Note (Signed)
Transition of Care [facility]) - Initial/Assessment Note    Patient Details  Name: [name] [name] MRN: [id number] Date of Birth: 09-Mar-1932  [facility]) CM/SW Contact:    Delilah Shan, LCSWA Phone Number: 03/17/2023, 10:56 AM  Clinical Narrative:                  CSW spoke with patients son who was at bedside informed CSW that him  and his sister patients daughter Waynetta Sandy  are patients HCPOA . Patients son confirmed patient comes from [facility]. Patients son confirmed plan is for patient to return to [facility] when medically ready for dc. Patients son informed CSW that him or his sister will transport patient to facility when medically ready for dc.CSW spoke with [place] at [facility] who informed CSW that patient can return when medically ready. Facility will need FL2 and DC summary Faxed to Att to: Sandy [phone]. CSW will continue to follow and assist with patients dc planning needs.       Patient Goals and CMS Choice            Expected Discharge Plan and Services                                              Prior Living Arrangements/Services                       Activities of Daily Living   ADL Screening (condition at time of admission) Independently performs ADLs?: Yes (appropriate for developmental age) Is the patient deaf or have difficulty hearing?: No Does the patient have difficulty seeing, even when wearing glasses/contacts?: No Does the patient have difficulty concentrating, remembering, or making decisions?: No  Permission Sought/Granted                  Emotional Assessment              Admission diagnosis:  Syncope, cardiogenic [R55] Injury of head, initial encounter [S09.90XA] Syncope, unspecified syncope type [R55] Patient Active Problem List   Diagnosis Date Noted   Syncope, cardiogenic 03/16/2023   Elevated troponin 03/16/2023   Ventricular arrhythmia 03/16/2023    Erythropoietin deficiency anemia 12/22/2022   AKI (acute kidney injury) (HCC) 09/05/2022   Anemia due to stage 3 chronic kidney disease (HCC) 08/28/2022   Acute on chronic diastolic (congestive) heart failure (HCC) 08/18/2022   Cellulitis of right leg 07/02/2022   Chronic obstructive asthma (HCC) 05/22/2022   Chronic respiratory failure with hypoxia (HCC) 05/22/2022   Exercise hypoxemia 04/28/2022   Essential (primary) hypertension    Hyperlipidemia, unspecified    Presence of cardiac pacemaker    Primary pulmonary hypertension (HCC)    Unspecified osteoarthritis, unspecified site    Anxiety disorder, unspecified    Atherosclerotic heart disease of native coronary artery without angina pectoris    Chronic atrial fibrillation (HCC)    Chronic diastolic (congestive) heart failure (HCC)    Chronic obstructive pulmonary disease, unspecified (HCC)    Headache    Unspecified fall, initial encounter    Degenerative disc disease, cervical 02/09/2018   CKD (chronic kidney disease) stage 4, GFR 15-29 ml/min (HCC) 05/07/2017   Pacemaker complications 09/28/2013   DOE (dyspnea on exertion) 07/19/2013   Chronic diastolic heart failure (HCC) 04/11/2013   Aortic stenosis 10/18/2012   Encounter for  long-term (current) use of anticoagulants 10/08/2011   Mixed hyperlipidemia 08/25/2008   Essential hypertension, benign 08/25/2008   Coronary atherosclerosis of native coronary artery 08/25/2008   Automatic implantable cardioverter-defibrillator in situ 08/25/2008   PCP:  [name], MD Pharmacy:   [facility] - [place], [place] - [address] [place] [address] Phone: [phone] Fax: [phone]  [facility] - [place], [place] - [place] [address] [place] [address] Phone: [phone] Fax: [phone]     Social Determinants of Health (SDOH) Social History: SDOH Screenings   Food Insecurity: No Food  Insecurity (03/16/2023)  Housing: Low Risk  (03/16/2023)  Transportation Needs: No Transportation Needs (03/16/2023)  Utilities: Not At Risk (03/16/2023)  Depression (PHQ2-9): High Risk (12/17/2022)  Tobacco Use: Low Risk  (03/16/2023)   SDOH Interventions:     Readmission Risk Interventions     No data to display

## 2023-03-17 NOTE — Progress Notes (Signed)
Patient refuses to stand for weight.  She states "I hurt all over and I can't move"  patient had fall at rehab and rt knee is swollen and red and very painful to touch

## 2023-03-17 NOTE — Consult Note (Signed)
ELECTROPHYSIOLOGY CONSULT NOTE    Patient ID: [name] [name] MRN: [id number], DOB/AGE: Dec 22, 1931 89 y.o.  Admit date: 03/16/2023 Date of Consult: 03/17/2023  Primary Physician: [name], MD Primary Cardiologist: Nona Dell, MD  Electrophysiologist: Dr. Ladona Ridgel   Referring Provider: Dr. Alvino Chapel  Patient Profile: [name] [name] is a 89 y.o. female with a history of chronic atrial fibrillation on Tikosyn, CHB, CAD s/p CABG (1998, LIMA-LAD, SVG-Diag, SVG-RI-OM + DES to PLA in 2006) , VT/VF s/p ICD, 3L O2 dependent severe COPD, CKD IV & anxiety  who is being seen today for the evaluation of torsades at the request of Dr. Alvino Chapel.  HPI:  [name] [name] is a 89 y.o. female who presented to [facility] from [facility] after a syncopal episode with scalp hematoma, epistaxis and facial bruising.   She had arrived to cardiopulmonary rehab and had checked into the clinic.  She then remembers waking up on the ground. She fell forward striking her face.  She reports feeling poorly since last year with fatigue and SOB with exertion.  She follows with Dr. Gala Romney for pulmonary hypertension.  She specifically denies chest pain in the recent weeks.  Does have chronic shortness of breath.  Notes that her torsemide had been increased for swelling and it had improved. CT of the neck/cervical spine was negative for fracture. CT maxillofacial showed a thin acute SDH, displaced nasal fractures and scalp hematomas.   She denies chest pain, palpitations, PND, orthopnea, nausea, vomiting, dizziness, weight gain, or early satiety.   Labs Potassium3.5 (11/27 5366) Magnesium  2.5* (11/27 0352) Creatinine, ser  1.65* (11/27 0352) PLT  160 (11/27 0352) HGB  10.9* (11/27 0352) WBC 9.8 (11/27 0352) Troponin I (High Sensitivity)243* (11/27 0349).    Past Medical History:  Diagnosis Date   Anxiety    Asthma    Chronic atrial fibrillation (HCC)    Chronic back pain    Chronic diastolic heart failure  (HCC)    Chronic obstructive pulmonary disease, unspecified (HCC)    Complete heart block (HCC)    COPD (chronic obstructive pulmonary disease) (HCC)    Coronary atherosclerosis of native coronary artery    a. s/p CABG in 1998 with LIMA-LAD, SVG-Diagonal, SVG-RI-OM b. DES to PLA in 2006   DDD (degenerative disc disease), lumbar    Essential hypertension    Headache    ICD (implantable cardioverter-defibrillator) in place    Mixed hyperlipidemia    Osteoarthritis    Ventricular fibrillation (HCC) 2003   a. seen on PPM interrogation a/w syncope     Surgical History:  Past Surgical History:  Procedure Laterality Date   CARDIAC DEFIBRILLATOR PLACEMENT     MDT dual chamber ICD   CARDIOVERSION N/A 08/09/2014   Procedure: CARDIOVERSION;  Surgeon: Thurmon Fair, MD;  Location: [facility];  Service: Cardiovascular;  Laterality: N/A;   CORONARY ARTERY BYPASS GRAFT     LIMA to LAD, SVG to diagonal, SVG to ramus and OM   ICD GENERATOR CHANGEOUT N/A 08/06/2021   Procedure: ICD GENERATOR CHANGEOUT;  Surgeon: Marinus Maw, MD;  Location: [facility] INVASIVE CV LAB;  Service: Cardiovascular;  Laterality: N/A;   IMPLANTABLE CARDIOVERTER DEFIBRILLATOR GENERATOR CHANGE N/A 09/25/2013   Procedure: IMPLANTABLE CARDIOVERTER DEFIBRILLATOR GENERATOR CHANGE;  Surgeon: Marinus Maw, MD;  Location: [facility] CATH LAB;  Service: Cardiovascular;  Laterality: N/A;   LEAD REVISION N/A 09/29/2013   Procedure: LEAD REVISION;  Surgeon: Duke Salvia, MD;  Location: [facility] CATH LAB;  Service: Cardiovascular;  Laterality: N/A;   TONSILLECTOMY     YAG LASER APPLICATION Left 12/27/2012   Procedure: YAG LASER APPLICATION;  Surgeon: Loraine Leriche T. Nile Riggs, MD;  Location: [facility];  Service: Ophthalmology;  Laterality: Left;   YAG LASER APPLICATION Right 01/10/2013   Procedure: YAG LASER APPLICATION;  Surgeon: Loraine Leriche T. Nile Riggs, MD;  Location: [facility];  Service: Ophthalmology;  Laterality: Right;     Medications Prior to Admission  Medication Sig  Dispense Refill Last Dose   acetaminophen (TYLENOL) 325 MG tablet Take 2 tablets (650 mg total) by mouth every 6 (six) hours as needed for mild pain (or Fever >/= 101). 100 tablet 2    albuterol (VENTOLIN HFA) 108 (90 Base) MCG/ACT inhaler Inhale 2 puffs into the lungs every 6 (six) hours as needed for wheezing or shortness of breath. 18 g 2    apixaban (ELIQUIS) 2.5 MG TABS tablet Take 1 tablet (2.5 mg total) by mouth 2 (two) times daily. 60 tablet 4    atorvastatin (LIPITOR) 20 MG tablet Take 1 tablet (20 mg total) by mouth daily. 90 tablet 3    Calcium Carbonate-Vitamin D (CALCIUM 600 + D PO) Take 1 tablet by mouth 2 (two) times daily.      dapagliflozin propanediol (FARXIGA) 10 MG TABS tablet Take 1 tablet (10 mg total) by mouth daily before breakfast. 30 tablet 6    dofetilide (TIKOSYN) 125 MCG capsule Take 1 capsule (125 mcg total) by mouth 2 (two) times daily. 180 capsule 2    Fluticasone-Umeclidin-Vilant (TRELEGY ELLIPTA) 100-62.5-25 MCG/ACT AEPB Inhale 1 puff into the lungs daily. 60 each 6    folic acid (FOLVITE) 1 MG tablet Take 1 tablet (1 mg total) by mouth daily. 30 tablet 3    losartan (COZAAR) 25 MG tablet Take 1 tablet (25 mg total) by mouth daily.      Polyethyl Glycol-Propyl Glycol (SYSTANE OP) Place 1 drop into both eyes daily as needed (Dry eyes).      polyethylene glycol (MIRALAX / GLYCOLAX) 17 g packet Take 17 g by mouth daily as needed for mild constipation. 14 each 0    torsemide (DEMADEX) 100 MG tablet Take 1 tablet (100 mg total) by mouth daily.      triamcinolone cream (KENALOG) 0.1 % Apply 1 application. topically daily as needed (itching).       Inpatient Medications:   atorvastatin  20 mg Oral Daily   fluticasone furoate-vilanterol  1 puff Inhalation Daily   And   umeclidinium bromide  1 puff Inhalation Daily   losartan  25 mg Oral Daily   sodium chloride flush  3 mL Intravenous Q12H   torsemide  100 mg Oral Daily    Allergies:  Allergies  Allergen  Reactions   Codeine Nausea And Vomiting   Fentanyl Itching    Itching, redness to scalp and neck   Keflex [Cephalexin] Itching and Rash    Family History  Problem Relation Age of Onset   Heart attack Father    Heart attack Brother      Physical Exam: Vitals:   03/17/23 0330 03/17/23 0600 03/17/23 0738 03/17/23 0914  BP: (!) 146/58  (!) 146/64 (!) 142/52  Pulse: 77     Resp: 20  20   Temp: (!) 97.5 F (36.4 C)  98.1 F (36.7 C)   TempSrc: Oral  Oral   SpO2: 99%     Weight:  73.5 kg    Height:  GEN- NAD, A&O x 3, normal affect HEENT: Normocephalic, facial bruising, scalp hematoma,  O2  Lungs- bibasilar crackles (effusions on CT), Normal effort.  Heart- Regular rate and rhythm, No M/G/R.  GI- Soft, NT, ND.  Extremities- No clubbing, cyanosis, or edema   Radiology/Studies: CT HEAD WO CONTRAST ( )  Result Date: 03/17/2023 CLINICAL DATA:  Subdural hematoma new left subdural hematoma EXAM: CT HEAD WITHOUT CONTRAST TECHNIQUE: Contiguous axial images were obtained from the base of the skull through the vertex without intravenous contrast. RADIATION DOSE REDUCTION: This exam was performed according to the departmental dose-optimization program which includes automated exposure control, adjustment of the mA and/or kV according to patient size and/or use of iterative reconstruction technique. COMPARISON:  Head CT 03/16/23, CT Face 03/17/23 FINDINGS: Brain: Slightly increased size of the subdural hematoma along the left tentorial leaflet, now measuring up to 3 mm, previously 2 mm. No new sites of hemorrhage identified. No hydrocephalus. No extra-axial fluid collection. No CT evidence of an acute cortical infarct. No mass effect. No mass lesion. Vascular: No hyperdense vessel or unexpected calcification. Skull: Soft tissue swelling along the right frontal scalp extending inferiorly to the level of the nasal bridge. There may be a mildly displaced nasal bone fracture on the left  (series 4, image 4). Sinuses/Orbits: No middle ear or mastoid effusion. Mucosal thickening in the bilateral sphenoid sinuses. Bilateral lens replacement. Orbits are otherwise unremarkable. Other: None. IMPRESSION: Minimal increase in size of left tentorial leaflet subdural hematoma, now measuring up to 3 mm, previously 2 mm. No new sites of hemorrhage identified. Electronically Signed   By: Lorenza Cambridge M.D.   On: 03/17/2023 10:55   CT MAXILLOFACIAL WO CONTRAST  Addendum Date: 03/17/2023   ADDENDUM REPORT: 03/17/2023 09:23 ADDENDUM: Impression #1 called by telephone at the time of interpretation on 03/17/2023 at 9:20 am to provider Noralee Stain, who verbally acknowledged these results. Electronically Signed   By: Jackey Loge D.O.   On: 03/17/2023 09:23   Result Date: 03/17/2023 CLINICAL DATA:  Provided history: Facial trauma, blunt. EXAM: CT MAXILLOFACIAL WITHOUT CONTRAST TECHNIQUE: Multidetector CT imaging of the maxillofacial structures was performed. Multiplanar CT image reconstructions were also generated. RADIATION DOSE REDUCTION: This exam was performed according to the departmental dose-optimization program which includes automated exposure control, adjustment of the mA and/or kV according to patient size and/or use of iterative reconstruction technique. COMPARISON:  Prior head CT examinations 03/16/2023 and earlier. Maxillofacial CT 08/29/2007. FINDINGS: Osseous: Displaced bilateral nasal bone fractures with overlying soft tissue swelling, likely acute. Chronic, depressed fracture deformity of the left orbital floor. Orbits: Chronic, depressed fracture deformity of the left orbital floor. No acute orbital finding. Sinuses: Trace mucosal thickening within the bilateral ethmoid sinuses. Moderate mucosal thickening within the left sphenoid sinus. Small-volume frothy secretions within the right sphenoid sinus. Soft tissues: Forehead and right frontotemporal scalp hematomas. Nasal soft tissue swelling.  Limited intracranial: Thin acute subdural hematoma along the left tentorium (measuring up to 2 mm in thickness), new from the prior head CT of 03/16/2023. Attempts are being made to reach the ordering provider regarding impression #1 at this time. IMPRESSION: 1. Thin acute subdural hematoma along the left tentorium (measuring up to 2 mm in thickness), new from yesterday's head CT. 2. Displaced bilateral nasal bone fractures with overlying soft tissue swelling, likely acute. 3. Chronic, depressed fracture deformity of the left orbital floor. 4. Forehead and right frontotemporal scalp hematomas. 5. Paranasal sinus disease as described. Electronically Signed: By: Jackey Loge D.O.  On: 03/17/2023 09:01   CT Cervical Spine Wo Contrast  Result Date: 03/16/2023 CLINICAL DATA:  Syncope EXAM: CT CERVICAL SPINE WITHOUT CONTRAST TECHNIQUE: Multidetector CT imaging of the cervical spine was performed without intravenous contrast. Multiplanar CT image reconstructions were also generated. RADIATION DOSE REDUCTION: This exam was performed according to the departmental dose-optimization program which includes automated exposure control, adjustment of the mA and/or kV according to patient size and/or use of iterative reconstruction technique. COMPARISON:  CT cervical spine 02/15/2018 FINDINGS: Alignment: Normal. Skull base and vertebrae: No acute fracture. No primary bone lesion or focal pathologic process. Soft tissues and spinal canal: No prevertebral fluid or swelling. No visible canal hematoma. Disc levels: There is disc space narrowing throughout the cervical spine most significant at C3-C4 and C4-C5 compatible with degenerative change. There is no severe central canal or neural foraminal stenosis at any level. Upper chest: There are small bilateral pleural effusions. Other: Left-sided pacemaker is present. IMPRESSION: 1. No acute fracture or traumatic subluxation of the cervical spine. 2. Small bilateral pleural  effusions. Electronically Signed   By: Darliss Cheney M.D.   On: 03/16/2023 18:49   CT Head Wo Contrast  Result Date: 03/16/2023 CLINICAL DATA:  Trauma syncope EXAM: CT HEAD WITHOUT CONTRAST TECHNIQUE: Contiguous axial images were obtained from the base of the skull through the vertex without intravenous contrast. RADIATION DOSE REDUCTION: This exam was performed according to the departmental dose-optimization program which includes automated exposure control, adjustment of the mA and/or kV according to patient size and/or use of iterative reconstruction technique. COMPARISON:  CT brain 03/26/2018 FINDINGS: Brain: No acute territorial infarction, hemorrhage or intracranial mass. Atrophy and chronic small vessel ischemic changes of the white matter. Nonenlarged ventricles Vascular: No hyperdense vessels.  Carotid vascular calcification Skull: Normal. Negative for fracture or focal lesion. Sinuses/Orbits: No acute finding. Other: Moderate right forehead scalp hematoma IMPRESSION: 1. No CT evidence for acute intracranial abnormality. 2. Atrophy and chronic small vessel ischemic changes of the white matter. 3. Moderate right forehead scalp hematoma. Electronically Signed   By: Jasmine Pang M.D.   On: 03/16/2023 18:48   DG Knee Complete 4 Views Right  Result Date: 03/16/2023 CLINICAL DATA:  Fall at rehab today. EXAM: RIGHT KNEE - COMPLETE 4+ VIEW COMPARISON:  Right knee radiographs 08/05/2017 FINDINGS: There is diffuse decreased bone mineralization. Mild medial and lateral compartment joint space narrowing. Mild to moderate medial and lateral compartment chondrocalcinosis. Mild patellofemoral joint space narrowing and peripheral osteophytosis. No joint effusion. Moderate to high-grade atherosclerotic calcifications. Surgical clips overlie the posteromedial distal thigh and proximal calf. IMPRESSION: 1. Mild tricompartmental osteoarthritis. 2. Mild to moderate medial and lateral compartment chondrocalcinosis. 3.  No acute fracture. Electronically Signed   By: Neita Garnet M.D.   On: 03/16/2023 18:46   DG Knee Complete 4 Views Left  Result Date: 03/16/2023 CLINICAL DATA:  Fall. EXAM: LEFT KNEE - COMPLETE 4+ VIEW COMPARISON:  None Available. FINDINGS: There is mildly decreased bone mineralization. Mild-to-moderate medial compartment joint space narrowing and peripheral osteophytosis. Mild patellofemoral joint space narrowing and peripheral osteophytosis. Mild-to-moderate medial and lateral compartment chondrocalcinosis. No joint effusion. No acute fracture or dislocation. Moderate to high-grade atherosclerotic calcifications. IMPRESSION: 1. Mild-to-moderate medial and mild patellofemoral compartment osteoarthritis. 2. Mild-to-moderate medial and lateral compartment chondrocalcinosis. Electronically Signed   By: Neita Garnet M.D.   On: 03/16/2023 18:44   DG Chest Portable 1 View  Result Date: 03/16/2023 CLINICAL DATA:  Syncope. EXAM: PORTABLE CHEST 1 VIEW COMPARISON:  08/23/2022. FINDINGS:  There is mild pulmonary vascular congestion. Bilateral small pleural effusions, decreased on the left side and increased on the right side. Redemonstration of right apical pleural cap. Bilateral lung fields are otherwise clear. No pneumothorax. Stable cardio-mediastinal silhouette. There are surgical staples along the heart border and sternotomy wires, status post CABG (coronary artery bypass graft). There is a left sided 3-lead pacemaker. No acute osseous abnormalities. The soft tissues are within normal limits. IMPRESSION: *Findings favor mild congestive heart failure/pulmonary edema. Electronically Signed   By: Jules Schick M.D.   On: 03/16/2023 16:11    EKG:02/13/23 > AF, A-VP with PVC's (personally reviewed)  TELEMETRY: VP 70's, AF (personally reviewed)  DEVICE HISTORY:  MDT Dual Chamber ICD, implant 12/10/01 for VT, CHB, gen change 07/2021  STUDIES ECHO 06/2022 > LVEF 50-55%, low normal LV function, RV systolic  function moderately reduced, LA / RA severely dilated   Assessment/Plan:  Torsades de Pointes / VT with Syncope Preserved LVEF. Shock x1 with 35j. Appears PVC started event. Device interrogation shows NSVT episodes began MN of 11/26 but were short. Shock occurred at 130PM (not 230 / device clock was not updated).  EGM review confirms torsades / VT  -stop Tikosyn, no plans to re-load. Has missed two doses currently.  -begin amiodarone 11/28 evening, 200 mg BIDx7d then 200mg  daily  -tele monitoring -avoid QT prolonging agents  -if needed, can increase LRL to 80 to help with PVC prevention, hold at 70 for now  Persistent Atrial Fibrillation  -stop tikosyn and allow wash out -would not be candidate for reload -hold anticoagulation for now with SDH  COPD  Pulmonary HTN  -O2 -per primary   Traumatic SDH -per primary  -reviewed with NSGY > rec's to hold OAC for minimum of 3d, ideally for 2 weeks.     For questions or updates, please contact [facility] Please consult [email or web address] for contact info under Cardiology/STEMI.  Signed, Canary Brim, MSN, APRN, NP-C, AGACNP-BC [facility] - Electrophysiology  03/17/2023, 11:05 AM

## 2023-03-17 NOTE — Progress Notes (Signed)
Spoke with patient regarding her refusal of supplemental potassium and IV heparin.   She is concerned that there are too many doctors ordering medications and notes that she had to take a medication a few days ago to lower her potassium. We discussed the rationale behind the potassium and heparin but at this point, she says she is only interested in speaking with Dr. Diona Browner, Dr. Ladona Ridgel, or Dr. Gala Romney.

## 2023-03-17 NOTE — Progress Notes (Signed)
Dr. Antionette Char at bedside.  Patient continues to refuse medication until she speaks with her cardiologist.

## 2023-03-17 NOTE — Plan of Care (Signed)
Pt is aox4, pain in leg from fall pre-rehab. Has a ICD and pacemaker- av paced on monitor

## 2023-03-17 NOTE — Progress Notes (Signed)
PHARMACY - ANTICOAGULATION CONSULT NOTE  Pharmacy Consult for heparin Indication: chest pain/ACS  Allergies  Allergen Reactions   Codeine Nausea And Vomiting   Fentanyl Itching    Itching, redness to scalp and neck   Keflex [Cephalexin] Itching and Rash    Patient Measurements: Height: 5' (152.4 cm) Weight: 73.7 kg (162 lb 6.3 oz) IBW/kg (Calculated) : 45.5 Heparin Dosing Weight: 61.9 kg  Vital Signs: Temp: 97.9 F (36.6 C) (11/26 2241) Temp Source: Oral (11/26 2241) BP: 132/69 (11/26 2241) Pulse Rate: 62 (11/26 2241)  Labs: Recent Labs    03/16/23 1400 03/16/23 1730  HGB 12.1  --   HCT 37.3  --   PLT 186  --   CREATININE 1.71*  --   CKTOTAL 44  --   TROPONINIHS 34* 180*    Estimated Creatinine Clearance: 20 mL/min (A) (by C-G formula based on SCr of 1.71 mg/dL (H)).  Assessment: 79 [facility] ICD shock. PMH includes hx of CHB, VT s/p [employer] DC ICD, CAD s/p CABG in 1998 (LIMA-LAD, SVG-Diag, SVG-RI-OM + DES to PLA in 2006), pulmonary HTN, HFpEF, pA on Eliquis, HTN, CKD stage III.   Pharmacy consulted to dose heparin for ACS.  -Last CBC stable, sCr 1.71, trops 34 > 180 -Last dose of eliquis on 11/26 in AM  Goal of Therapy:  Heparin level 0.3-0.7 units/ml aPTT 66-102 seconds Monitor platelets by anticoagulation protocol: Yes   Plan:  No bolus given recent eliquis dose Start heparin drip at 850 units/hr 8h aPTT and heparin level Monitor aPTT and heparin levels daily until correlate, then switch to heparin levels CBC daily  Arabella Merles, PharmD. Clinical Pharmacist 03/17/2023 2:26 AM

## 2023-03-17 NOTE — TOC CAGE-AID Note (Signed)
Transition of Care [facility]) - CAGE-AID Screening   Patient Details  Name: [name] [name] MRN: [id number] Date of Birth: 01-29-1932  [facility]) CM/SW Contact:    Katha Hamming, RN Phone Number: 03/17/2023, 9:51 PM   CAGE-AID Screening:    Have You Ever Felt You Ought to Cut Down on Your Drinking or Drug Use?: No Have People Annoyed You By [employer] Your Drinking Or Drug Use?: No Have You Felt Bad Or Guilty About Your Drinking Or Drug Use?: No Have You Ever Had a Drink or Used Drugs First Thing In The Morning to Steady Your Nerves or to Get Rid of a Hangover?: No CAGE-AID Score: 0  Substance Abuse Education Offered: No

## 2023-03-18 DIAGNOSIS — R55 Syncope and collapse: Secondary | ICD-10-CM | POA: Diagnosis not present

## 2023-03-18 LAB — BASIC METABOLIC PANEL
Anion gap: 13 (ref 5–15)
BUN: 31 mg/dL — ABNORMAL HIGH (ref 8–23)
CO2: 26 mmol/L (ref 22–32)
Calcium: 9 mg/dL (ref 8.9–10.3)
Chloride: 102 mmol/L (ref 98–111)
Creatinine, Ser: 1.39 mg/dL — ABNORMAL HIGH (ref 0.44–1.00)
GFR, Estimated: 36 mL/min — ABNORMAL LOW (ref >60)
Glucose, Bld: 121 mg/dL — ABNORMAL HIGH (ref 70–99)
Potassium: 3.6 mmol/L (ref 3.5–5.1)
Sodium: 141 mmol/L (ref 135–145)

## 2023-03-18 LAB — CBC
HCT: 35.1 % — ABNORMAL LOW (ref 36.0–46.0)
Hemoglobin: 11.4 g/dL — ABNORMAL LOW (ref 12.0–15.0)
MCH: 32.1 pg (ref 26.0–34.0)
MCHC: 32.5 g/dL (ref 30.0–36.0)
MCV: 98.9 fL (ref 80.0–100.0)
Platelets: 170 10*3/uL (ref 150–400)
RBC: 3.55 MIL/uL — ABNORMAL LOW (ref 3.87–5.11)
RDW: 14.9 % (ref 11.5–15.5)
WBC: 13.4 10*3/uL — ABNORMAL HIGH (ref 4.0–10.5)
nRBC: 0 % (ref 0.0–0.2)

## 2023-03-18 MED ORDER — POTASSIUM CHLORIDE CRYS ER 20 MEQ PO TBCR
40.0000 meq | EXTENDED_RELEASE_TABLET | Freq: Once | ORAL | Status: AC
  Administered 2023-03-18: 40 meq via ORAL
  Filled 2023-03-18: qty 2

## 2023-03-18 MED ORDER — IPRATROPIUM-ALBUTEROL 0.5-2.5 (3) MG/3ML IN SOLN: 3.0000 mL | Freq: Four times a day (QID) | RESPIRATORY_TRACT | Status: DC | PRN

## 2023-03-18 MED ORDER — METHOCARBAMOL 1000 MG/10ML IJ SOLN
500.0000 mg | Freq: Four times a day (QID) | INTRAMUSCULAR | Status: DC | PRN
  Administered 2023-03-18 – 2023-03-20 (×6): 500 mg via INTRAVENOUS
  Filled 2023-03-18 (×5): qty 10

## 2023-03-18 MED ORDER — HYDROMORPHONE HCL 1 MG/ML IJ SOLN
0.5000 mg | INTRAMUSCULAR | Status: DC | PRN
  Administered 2023-03-19 – 2023-03-20 (×3): 0.5 mg via INTRAVENOUS
  Filled 2023-03-18 (×3): qty 1

## 2023-03-18 MED ORDER — AMIODARONE HCL 200 MG PO TABS
400.0000 mg | ORAL_TABLET | Freq: Two times a day (BID) | ORAL | Status: DC
  Administered 2023-03-18 – 2023-03-23 (×11): 400 mg via ORAL
  Filled 2023-03-18 (×11): qty 2

## 2023-03-18 MED ORDER — PROCHLORPERAZINE EDISYLATE 10 MG/2ML IJ SOLN
10.0000 mg | Freq: Four times a day (QID) | INTRAMUSCULAR | Status: DC | PRN
  Administered 2023-03-23: 10 mg via INTRAVENOUS
  Filled 2023-03-18: qty 2

## 2023-03-18 NOTE — TOC Progression Note (Signed)
Transition of Care [facility]) - Progression Note    Patient Details  Name: [name] [name] MRN: [id number] Date of Birth: 1931-07-12  Transition of Care [facility]) CM/SW Contact  Delilah Shan, LCSWA Phone Number: 03/18/2023, 11:41 AM  Clinical Narrative:     Plan for patient to return to [facility] when medically ready for dc. CSW will continue to follow.       Expected Discharge Plan and Services                                               Social Determinants of Health (SDOH) Interventions SDOH Screenings   Food Insecurity: No Food Insecurity (03/16/2023)  Housing: Low Risk  (03/16/2023)  Transportation Needs: No Transportation Needs (03/16/2023)  Utilities: Not At Risk (03/16/2023)  Depression (PHQ2-9): High Risk (12/17/2022)  Tobacco Use: Low Risk  (03/16/2023)    Readmission Risk Interventions     No data to display

## 2023-03-18 NOTE — Progress Notes (Signed)
PROGRESS NOTE    [name] [name]  WUJ:[id number] DOB: [birth year] DOA: 03/16/2023 PCP: [name], MD     Brief Narrative:  [name] [name] is a 89 y.o. female with medical history significant for COPD, chronic respiratory failure, heart block with pacemaker, pulmonary hypertension, chronic HFpEF, atrial fibrillation on Eliquis, and CKD 4 who presents with scalp hematoma and epistaxis after a syncopal episode with fall.   Patient was at cardiac rehab and was transferring from her walker to a chair when she had a sudden loss of consciousness and fell, hitting her face and head.  She quickly regained awareness and had epistaxis and a frontal scalp hematoma.  She denies any recent chest pain.  She has not felt a shock from her cardiac device.  She reports mild dyspnea without cough, denies fever or chills, and notes that her bilateral lower extremity swelling is about the same as usual.   Upon interrogation of her ICD, was found that patient had V-fib arrest earlier and was defibrillated out of it.  Patient was admitted and cardiology consulted.  New events last 24 hours / Subjective: Complaining of neck stiffness and pain, general discomfort, as well as some shortness of breath  Assessment & Plan:   Principal Problem:   Syncope, cardiogenic Active Problems:   Coronary atherosclerosis of native coronary artery   CKD (chronic kidney disease) stage 4, GFR 15-29 ml/min (HCC)   Essential (primary) hypertension   Primary pulmonary hypertension (HCC)   Chronic atrial fibrillation (HCC)   Chronic obstructive pulmonary disease, unspecified (HCC)   Chronic respiratory failure with hypoxia (HCC)   Elevated troponin   Ventricular arrhythmia   Subdural hematoma (HCC)   Nasal fracture   Torsades, V-fib A-fib -ICD interrogation found patient had torsades de points, V-fib and received 1 shock -Tikosyn discontinued -Amiodarone -Cardiology following  Left subdural hematoma -CT head: Minimal  increase in size of left tentorial leaflet subdural hematoma, now measuring up to 3 mm, previously 2 mm. No new sites of hemorrhage identified. -IV heparin currently on hold -Discussed with neurosurgery over the phone 11/27.  No surgical interventions required.  Consider repeat head imaging if patient has any mental decline.  Ideally, patient would need to be off anticoagulation for 2 weeks.  However, considering her ACS and A-fib, could consider starting short acting anticoagulation such as IV heparin or Lovenox in 3 days and monitor closely  Bilateral nasal fractures -Allow swelling to go down, she will need outpatient ENT evaluation  CAD NSTEMI -Troponin 34, 180, 243 -Lipitor  COPD with chronic hypoxic respiratory failure -Continue inhalers  Chronic diastolic CHF, pulmonary hypertension -BNP 747.5 -Torsemide, losartan  CKD stage IV -Baseline creatinine 2.63   DVT prophylaxis:  SCDs Start: 03/16/23 2155  Code Status: Full code Family Communication: Daughter at bedside Disposition Plan: PT OT Pending Status is: Inpatient Remains inpatient appropriate because: Further stabilization, EP plan    Antimicrobials:  Anti-infectives (From admission, onward)    None        Objective: Vitals:   03/18/23 0216 03/18/23 0550 03/18/23 0700 03/18/23 1226  BP: (!) 133/43 (!) 157/59 (!) 161/59 (!) 149/80  Pulse: 76 75    Resp: (!) 33 (!) 28    Temp: (!) 97.5 F (36.4 C) 97.6 F (36.4 C) (!) 97.5 F (36.4 C) 98.6 F (37 C)  TempSrc: Oral Oral Oral Oral  SpO2: 95% 93%    Weight:  73.8 kg    Height:  Intake/Output Summary (Last 24 hours) at 03/18/2023 1425 Last data filed at 03/18/2023 0553 Gross per 24 hour  Intake 180 ml  Output 750 ml  Net -570 ml   Filed Weights   03/16/23 1344 03/17/23 0600 03/18/23 0550  Weight: 73.7 kg 73.5 kg 73.8 kg    Examination:  General exam: Appears calm and comfortable  Respiratory system: Clear to auscultation. Respiratory  effort slightly increased, tachypneic without distress Cardiovascular system: S1 & S2 heard, RR Gastrointestinal system: Abdomen is nondistended, soft and nontender.  Central nervous system: Alert and oriented. No focal neurological deficits. Speech clear.  Extremities: Right lower extremity edema with significant bruising Skin: Facial bruising Psychiatry: Judgement and insight appear normal. Mood & affect appropriate.   Data Reviewed: I have personally reviewed following labs and imaging studies  CBC: Recent Labs  Lab 03/16/23 1400 03/17/23 0352 03/18/23 0345  WBC 10.3 9.8 13.4*  NEUTROABS 6.0  --   --   HGB 12.1 10.9* 11.4*  HCT 37.3 33.1* 35.1*  MCV 100.0 98.5 98.9  PLT 186 160 170   Basic Metabolic Panel: Recent Labs  Lab 03/16/23 1400 03/17/23 0352 03/18/23 0345  NA 134* 141 141  K 3.5 3.5 3.6  CL 96* 100 102  CO2 27 27 26   GLUCOSE 134* 104* 121*  BUN 45* 40* 31*  CREATININE 1.71* 1.65* 1.39*  CALCIUM 9.2 9.2 9.0  MG  --  2.5*  --    GFR: Estimated Creatinine Clearance: 24.6 mL/min (A) (by C-G formula based on SCr of 1.39 mg/dL (H)). Liver Function Tests: No results for input(s): "AST", "ALT", "ALKPHOS", "BILITOT", "PROT", "ALBUMIN" in the last 168 hours. No results for input(s): "LIPASE", "AMYLASE" in the last 168 hours. No results for input(s): "AMMONIA" in the last 168 hours. Coagulation Profile: No results for input(s): "INR", "PROTIME" in the last 168 hours. Cardiac Enzymes: Recent Labs  Lab 03/16/23 1400  CKTOTAL 44   BNP (last 3 results) No results for input(s): "PROBNP" in the last 8760 hours. HbA1C: No results for input(s): "HGBA1C" in the last 72 hours. CBG: No results for input(s): "GLUCAP" in the last 168 hours. Lipid Profile: No results for input(s): "CHOL", "HDL", "LDLCALC", "TRIG", "CHOLHDL", "LDLDIRECT" in the last 72 hours. Thyroid Function Tests: No results for input(s): "TSH", "T4TOTAL", "FREET4", "T3FREE", "THYROIDAB" in the last  72 hours. Anemia Panel: No results for input(s): "VITAMINB12", "FOLATE", "FERRITIN", "TIBC", "IRON", "RETICCTPCT" in the last 72 hours. Sepsis Labs: No results for input(s): "PROCALCITON", "LATICACIDVEN" in the last 168 hours.  No results found for this or any previous visit (from the past 240 hour(s)).    Radiology Studies: CT HEAD WO CONTRAST ( )  Result Date: 03/17/2023 CLINICAL DATA:  Subdural hematoma new left subdural hematoma EXAM: CT HEAD WITHOUT CONTRAST TECHNIQUE: Contiguous axial images were obtained from the base of the skull through the vertex without intravenous contrast. RADIATION DOSE REDUCTION: This exam was performed according to the departmental dose-optimization program which includes automated exposure control, adjustment of the mA and/or kV according to patient size and/or use of iterative reconstruction technique. COMPARISON:  Head CT 03/16/23, CT Face 03/17/23 FINDINGS: Brain: Slightly increased size of the subdural hematoma along the left tentorial leaflet, now measuring up to 3 mm, previously 2 mm. No new sites of hemorrhage identified. No hydrocephalus. No extra-axial fluid collection. No CT evidence of an acute cortical infarct. No mass effect. No mass lesion. Vascular: No hyperdense vessel or unexpected calcification. Skull: Soft tissue swelling along the right frontal  scalp extending inferiorly to the level of the nasal bridge. There may be a mildly displaced nasal bone fracture on the left (series 4, image 4). Sinuses/Orbits: No middle ear or mastoid effusion. Mucosal thickening in the bilateral sphenoid sinuses. Bilateral lens replacement. Orbits are otherwise unremarkable. Other: None. IMPRESSION: Minimal increase in size of left tentorial leaflet subdural hematoma, now measuring up to 3 mm, previously 2 mm. No new sites of hemorrhage identified. Electronically Signed   By: Lorenza Cambridge M.D.   On: 03/17/2023 10:55   CT MAXILLOFACIAL WO CONTRAST  Addendum Date:  03/17/2023   ADDENDUM REPORT: 03/17/2023 09:23 ADDENDUM: Impression #1 called by telephone at the time of interpretation on 03/17/2023 at 9:20 am to provider Noralee Stain, who verbally acknowledged these results. Electronically Signed   By: Jackey Loge D.O.   On: 03/17/2023 09:23   Result Date: 03/17/2023 CLINICAL DATA:  Provided history: Facial trauma, blunt. EXAM: CT MAXILLOFACIAL WITHOUT CONTRAST TECHNIQUE: Multidetector CT imaging of the maxillofacial structures was performed. Multiplanar CT image reconstructions were also generated. RADIATION DOSE REDUCTION: This exam was performed according to the departmental dose-optimization program which includes automated exposure control, adjustment of the mA and/or kV according to patient size and/or use of iterative reconstruction technique. COMPARISON:  Prior head CT examinations 03/16/2023 and earlier. Maxillofacial CT 08/29/2007. FINDINGS: Osseous: Displaced bilateral nasal bone fractures with overlying soft tissue swelling, likely acute. Chronic, depressed fracture deformity of the left orbital floor. Orbits: Chronic, depressed fracture deformity of the left orbital floor. No acute orbital finding. Sinuses: Trace mucosal thickening within the bilateral ethmoid sinuses. Moderate mucosal thickening within the left sphenoid sinus. Small-volume frothy secretions within the right sphenoid sinus. Soft tissues: Forehead and right frontotemporal scalp hematomas. Nasal soft tissue swelling. Limited intracranial: Thin acute subdural hematoma along the left tentorium (measuring up to 2 mm in thickness), new from the prior head CT of 03/16/2023. Attempts are being made to reach the ordering provider regarding impression #1 at this time. IMPRESSION: 1. Thin acute subdural hematoma along the left tentorium (measuring up to 2 mm in thickness), new from yesterday's head CT. 2. Displaced bilateral nasal bone fractures with overlying soft tissue swelling, likely acute. 3.  Chronic, depressed fracture deformity of the left orbital floor. 4. Forehead and right frontotemporal scalp hematomas. 5. Paranasal sinus disease as described. Electronically Signed: By: Jackey Loge D.O. On: 03/17/2023 09:01   CT Cervical Spine Wo Contrast  Result Date: 03/16/2023 CLINICAL DATA:  Syncope EXAM: CT CERVICAL SPINE WITHOUT CONTRAST TECHNIQUE: Multidetector CT imaging of the cervical spine was performed without intravenous contrast. Multiplanar CT image reconstructions were also generated. RADIATION DOSE REDUCTION: This exam was performed according to the departmental dose-optimization program which includes automated exposure control, adjustment of the mA and/or kV according to patient size and/or use of iterative reconstruction technique. COMPARISON:  CT cervical spine 02/15/2018 FINDINGS: Alignment: Normal. Skull base and vertebrae: No acute fracture. No primary bone lesion or focal pathologic process. Soft tissues and spinal canal: No prevertebral fluid or swelling. No visible canal hematoma. Disc levels: There is disc space narrowing throughout the cervical spine most significant at C3-C4 and C4-C5 compatible with degenerative change. There is no severe central canal or neural foraminal stenosis at any level. Upper chest: There are small bilateral pleural effusions. Other: Left-sided pacemaker is present. IMPRESSION: 1. No acute fracture or traumatic subluxation of the cervical spine. 2. Small bilateral pleural effusions. Electronically Signed   By: Darliss Cheney M.D.   On: 03/16/2023 18:49  CT Head Wo Contrast  Result Date: 03/16/2023 CLINICAL DATA:  Trauma syncope EXAM: CT HEAD WITHOUT CONTRAST TECHNIQUE: Contiguous axial images were obtained from the base of the skull through the vertex without intravenous contrast. RADIATION DOSE REDUCTION: This exam was performed according to the departmental dose-optimization program which includes automated exposure control, adjustment of the mA  and/or kV according to patient size and/or use of iterative reconstruction technique. COMPARISON:  CT brain 03/26/2018 FINDINGS: Brain: No acute territorial infarction, hemorrhage or intracranial mass. Atrophy and chronic small vessel ischemic changes of the white matter. Nonenlarged ventricles Vascular: No hyperdense vessels.  Carotid vascular calcification Skull: Normal. Negative for fracture or focal lesion. Sinuses/Orbits: No acute finding. Other: Moderate right forehead scalp hematoma IMPRESSION: 1. No CT evidence for acute intracranial abnormality. 2. Atrophy and chronic small vessel ischemic changes of the white matter. 3. Moderate right forehead scalp hematoma. Electronically Signed   By: Jasmine Pang M.D.   On: 03/16/2023 18:48   DG Knee Complete 4 Views Right  Result Date: 03/16/2023 CLINICAL DATA:  Fall at rehab today. EXAM: RIGHT KNEE - COMPLETE 4+ VIEW COMPARISON:  Right knee radiographs 08/05/2017 FINDINGS: There is diffuse decreased bone mineralization. Mild medial and lateral compartment joint space narrowing. Mild to moderate medial and lateral compartment chondrocalcinosis. Mild patellofemoral joint space narrowing and peripheral osteophytosis. No joint effusion. Moderate to high-grade atherosclerotic calcifications. Surgical clips overlie the posteromedial distal thigh and proximal calf. IMPRESSION: 1. Mild tricompartmental osteoarthritis. 2. Mild to moderate medial and lateral compartment chondrocalcinosis. 3. No acute fracture. Electronically Signed   By: Neita Garnet M.D.   On: 03/16/2023 18:46   DG Knee Complete 4 Views Left  Result Date: 03/16/2023 CLINICAL DATA:  Fall. EXAM: LEFT KNEE - COMPLETE 4+ VIEW COMPARISON:  None Available. FINDINGS: There is mildly decreased bone mineralization. Mild-to-moderate medial compartment joint space narrowing and peripheral osteophytosis. Mild patellofemoral joint space narrowing and peripheral osteophytosis. Mild-to-moderate medial and lateral  compartment chondrocalcinosis. No joint effusion. No acute fracture or dislocation. Moderate to high-grade atherosclerotic calcifications. IMPRESSION: 1. Mild-to-moderate medial and mild patellofemoral compartment osteoarthritis. 2. Mild-to-moderate medial and lateral compartment chondrocalcinosis. Electronically Signed   By: Neita Garnet M.D.   On: 03/16/2023 18:44      Scheduled Meds:  amiodarone  400 mg Oral BID   atorvastatin  20 mg Oral QHS   fluticasone furoate-vilanterol  1 puff Inhalation Daily   And   umeclidinium bromide  1 puff Inhalation Daily   losartan  25 mg Oral Daily   sodium chloride flush  3 mL Intravenous Q12H   torsemide  100 mg Oral Daily   Continuous Infusions:   LOS: 2 days   Time spent: 35 minutes   Noralee Stain, DO [facility] 03/18/2023, 2:25 PM   Available via Epic secure chat 7am-7pm After these hours, please refer to coverage provider listed on [email or web address]

## 2023-03-18 NOTE — Progress Notes (Addendum)
Electrophysiology Progress Note  Patient Name: [name] [name] Date of Encounter: 03/18/2023  Primary Cardiologist: Diona Browner Electrophysiologist: Ladona Ridgel CHF: Bensimhon   Subjective   Pt having neck pain and stiffness.  Inpatient Medications    Scheduled Meds:  atorvastatin  20 mg Oral QHS   fluticasone furoate-vilanterol  1 puff Inhalation Daily   And   umeclidinium bromide  1 puff Inhalation Daily   losartan  25 mg Oral Daily   sodium chloride flush  3 mL Intravenous Q12H   torsemide  100 mg Oral Daily   Continuous Infusions:  PRN Meds: acetaminophen **OR** acetaminophen, albuterol, fentaNYL (SUBLIMAZE) injection, oxyCODONE, senna   Vital Signs    Vitals:   03/17/23 1623 03/17/23 1958 03/18/23 0216 03/18/23 0550  BP: (!) 155/82 136/61 (!) 133/43 (!) 157/59  Pulse:  73 76 75  Resp:  (!) 28 (!) 33 (!) 28  Temp: 98.2 F (36.8 C) (!) 97.4 F (36.3 C) (!) 97.5 F (36.4 C) 97.6 F (36.4 C)  TempSrc: Oral Oral Oral Oral  SpO2:  96% 95% 93%  Weight:    73.8 kg  Height:        Intake/Output Summary (Last 24 hours) at 03/18/2023 0806 Last data filed at 03/18/2023 0553 Gross per 24 hour  Intake 280 ml  Output 1150 ml  Net -870 ml   Filed Weights   03/16/23 1344 03/17/23 0600 03/18/23 0550  Weight: 73.7 kg 73.5 kg 73.8 kg    Physical Exam    GEN- The patient is alert and oriented, appears unfomfortable   Lungs- Clear to ausculation bilaterally in anterior fields (did not have her raise up due to neck pain), normal work of breathing Heart- Regular rate and rhythm, 1+ systolic murmur, no rubs or gallops   Labs    Reviewed in Epic TnI peaked at 243. WBC up 9.9 --> 13.4  Telemetry    V-paced,  (personally reviewed)  Radiology    CT HEAD WO CONTRAST ( )  Result Date: 03/17/2023 CLINICAL DATA:  Subdural hematoma new left subdural hematoma EXAM: CT HEAD WITHOUT CONTRAST TECHNIQUE: Contiguous axial images were obtained from the base of the skull  through the vertex without intravenous contrast. RADIATION DOSE REDUCTION: This exam was performed according to the departmental dose-optimization program which includes automated exposure control, adjustment of the mA and/or kV according to patient size and/or use of iterative reconstruction technique. COMPARISON:  Head CT 03/16/23, CT Face 03/17/23 FINDINGS: Brain: Slightly increased size of the subdural hematoma along the left tentorial leaflet, now measuring up to 3 mm, previously 2 mm. No new sites of hemorrhage identified. No hydrocephalus. No extra-axial fluid collection. No CT evidence of an acute cortical infarct. No mass effect. No mass lesion. Vascular: No hyperdense vessel or unexpected calcification. Skull: Soft tissue swelling along the right frontal scalp extending inferiorly to the level of the nasal bridge. There may be a mildly displaced nasal bone fracture on the left (series 4, image 4). Sinuses/Orbits: No middle ear or mastoid effusion. Mucosal thickening in the bilateral sphenoid sinuses. Bilateral lens replacement. Orbits are otherwise unremarkable. Other: None. IMPRESSION: Minimal increase in size of left tentorial leaflet subdural hematoma, now measuring up to 3 mm, previously 2 mm. No new sites of hemorrhage identified. Electronically Signed   By: Lorenza Cambridge M.D.   On: 03/17/2023 10:55   CT MAXILLOFACIAL WO CONTRAST  Addendum Date: 03/17/2023   ADDENDUM REPORT: 03/17/2023 09:23 ADDENDUM: Impression #1 called by telephone at the time of interpretation  on 03/17/2023 at 9:20 am to provider Noralee Stain, who verbally acknowledged these results. Electronically Signed   By: Jackey Loge D.O.   On: 03/17/2023 09:23   Result Date: 03/17/2023 CLINICAL DATA:  Provided history: Facial trauma, blunt. EXAM: CT MAXILLOFACIAL WITHOUT CONTRAST TECHNIQUE: Multidetector CT imaging of the maxillofacial structures was performed. Multiplanar CT image reconstructions were also generated. RADIATION  DOSE REDUCTION: This exam was performed according to the departmental dose-optimization program which includes automated exposure control, adjustment of the mA and/or kV according to patient size and/or use of iterative reconstruction technique. COMPARISON:  Prior head CT examinations 03/16/2023 and earlier. Maxillofacial CT 08/29/2007. FINDINGS: Osseous: Displaced bilateral nasal bone fractures with overlying soft tissue swelling, likely acute. Chronic, depressed fracture deformity of the left orbital floor. Orbits: Chronic, depressed fracture deformity of the left orbital floor. No acute orbital finding. Sinuses: Trace mucosal thickening within the bilateral ethmoid sinuses. Moderate mucosal thickening within the left sphenoid sinus. Small-volume frothy secretions within the right sphenoid sinus. Soft tissues: Forehead and right frontotemporal scalp hematomas. Nasal soft tissue swelling. Limited intracranial: Thin acute subdural hematoma along the left tentorium (measuring up to 2 mm in thickness), new from the prior head CT of 03/16/2023. Attempts are being made to reach the ordering provider regarding impression #1 at this time. IMPRESSION: 1. Thin acute subdural hematoma along the left tentorium (measuring up to 2 mm in thickness), new from yesterday's head CT. 2. Displaced bilateral nasal bone fractures with overlying soft tissue swelling, likely acute. 3. Chronic, depressed fracture deformity of the left orbital floor. 4. Forehead and right frontotemporal scalp hematomas. 5. Paranasal sinus disease as described. Electronically Signed: By: Jackey Loge D.O. On: 03/17/2023 09:01   CT Cervical Spine Wo Contrast  Result Date: 03/16/2023 CLINICAL DATA:  Syncope EXAM: CT CERVICAL SPINE WITHOUT CONTRAST TECHNIQUE: Multidetector CT imaging of the cervical spine was performed without intravenous contrast. Multiplanar CT image reconstructions were also generated. RADIATION DOSE REDUCTION: This exam was performed  according to the departmental dose-optimization program which includes automated exposure control, adjustment of the mA and/or kV according to patient size and/or use of iterative reconstruction technique. COMPARISON:  CT cervical spine 02/15/2018 FINDINGS: Alignment: Normal. Skull base and vertebrae: No acute fracture. No primary bone lesion or focal pathologic process. Soft tissues and spinal canal: No prevertebral fluid or swelling. No visible canal hematoma. Disc levels: There is disc space narrowing throughout the cervical spine most significant at C3-C4 and C4-C5 compatible with degenerative change. There is no severe central canal or neural foraminal stenosis at any level. Upper chest: There are small bilateral pleural effusions. Other: Left-sided pacemaker is present. IMPRESSION: 1. No acute fracture or traumatic subluxation of the cervical spine. 2. Small bilateral pleural effusions. Electronically Signed   By: Darliss Cheney M.D.   On: 03/16/2023 18:49   CT Head Wo Contrast  Result Date: 03/16/2023 CLINICAL DATA:  Trauma syncope EXAM: CT HEAD WITHOUT CONTRAST TECHNIQUE: Contiguous axial images were obtained from the base of the skull through the vertex without intravenous contrast. RADIATION DOSE REDUCTION: This exam was performed according to the departmental dose-optimization program which includes automated exposure control, adjustment of the mA and/or kV according to patient size and/or use of iterative reconstruction technique. COMPARISON:  CT brain 03/26/2018 FINDINGS: Brain: No acute territorial infarction, hemorrhage or intracranial mass. Atrophy and chronic small vessel ischemic changes of the white matter. Nonenlarged ventricles Vascular: No hyperdense vessels.  Carotid vascular calcification Skull: Normal. Negative for fracture or focal lesion.  Sinuses/Orbits: No acute finding. Other: Moderate right forehead scalp hematoma IMPRESSION: 1. No CT evidence for acute intracranial abnormality. 2.  Atrophy and chronic small vessel ischemic changes of the white matter. 3. Moderate right forehead scalp hematoma. Electronically Signed   By: Jasmine Pang M.D.   On: 03/16/2023 18:48   DG Knee Complete 4 Views Right  Result Date: 03/16/2023 CLINICAL DATA:  Fall at rehab today. EXAM: RIGHT KNEE - COMPLETE 4+ VIEW COMPARISON:  Right knee radiographs 08/05/2017 FINDINGS: There is diffuse decreased bone mineralization. Mild medial and lateral compartment joint space narrowing. Mild to moderate medial and lateral compartment chondrocalcinosis. Mild patellofemoral joint space narrowing and peripheral osteophytosis. No joint effusion. Moderate to high-grade atherosclerotic calcifications. Surgical clips overlie the posteromedial distal thigh and proximal calf. IMPRESSION: 1. Mild tricompartmental osteoarthritis. 2. Mild to moderate medial and lateral compartment chondrocalcinosis. 3. No acute fracture. Electronically Signed   By: Neita Garnet M.D.   On: 03/16/2023 18:46   DG Knee Complete 4 Views Left  Result Date: 03/16/2023 CLINICAL DATA:  Fall. EXAM: LEFT KNEE - COMPLETE 4+ VIEW COMPARISON:  None Available. FINDINGS: There is mildly decreased bone mineralization. Mild-to-moderate medial compartment joint space narrowing and peripheral osteophytosis. Mild patellofemoral joint space narrowing and peripheral osteophytosis. Mild-to-moderate medial and lateral compartment chondrocalcinosis. No joint effusion. No acute fracture or dislocation. Moderate to high-grade atherosclerotic calcifications. IMPRESSION: 1. Mild-to-moderate medial and mild patellofemoral compartment osteoarthritis. 2. Mild-to-moderate medial and lateral compartment chondrocalcinosis. Electronically Signed   By: Neita Garnet M.D.   On: 03/16/2023 18:44   DG Chest Portable 1 View  Result Date: 03/16/2023 CLINICAL DATA:  Syncope. EXAM: PORTABLE CHEST 1 VIEW COMPARISON:  08/23/2022. FINDINGS: There is mild pulmonary vascular congestion.  Bilateral small pleural effusions, decreased on the left side and increased on the right side. Redemonstration of right apical pleural cap. Bilateral lung fields are otherwise clear. No pneumothorax. Stable cardio-mediastinal silhouette. There are surgical staples along the heart border and sternotomy wires, status post CABG (coronary artery bypass graft). There is a left sided 3-lead pacemaker. No acute osseous abnormalities. The soft tissues are within normal limits. IMPRESSION: *Findings favor mild congestive heart failure/pulmonary edema. Electronically Signed   By: Jules Schick M.D.   On: 03/16/2023 16:11     Patient Profile     [name] [name] is a 89 y.o. female with a past medical history significant for COPD, chronic respiratory failure, heart block with pacemaker then upgrade to ICD for PMVT, pulmonary hypertension, chronic diastolic heart failure, atrial fibrillation on Tikosyn and Eliquis, and CKD 4.    She was admitted for syncope and ICD shock.   Assessment & Plan     Polymorphic VT/VF w/ cardiogenic syncope Received appropriate therapy prompting current admission Appears to be precipitated by R-on-T QTC on admission difficult to ascertain due to bigeminal PVCs on ECG, paced rhythm with wide QRS. On 11/27 ECG it appears to be within normal limits when corrected for QTC prolongation Tikosyn discontinued LRL was increased Start PO amiodarone load - 400mg  PO BID until she receives 10g, then 200mg  daily. If she does not tolerate the 400mg  dose (usually due to GI issues) can switch to 200mg  TID Will obtain baseline TSH and CMP  Atrial fibrillation Previously managed with tikosyn, which has been discontinued Anticoagulation held due to subdural hematoma  Subdural hematoma Resume anticoagulation when appropriate  Troponin elevation Per prior notes, case discussed with interventional cardiologist, and it was felt that the risk of cath would outweigh the  benefits, especially in the  setting of active subdural hematoma  Complete heart block 99.9% V-paced on last interrogation (prior to this event and LRL increase) EF is preserved on last TTE  York Pellant MD 03/18/2023 8:06 AM   For questions or updates, please contact [facility] Please consult [email or web address] for contact info under Cardiology/STEMI.  Signed, Maurice Small, MD  03/18/2023, 8:06 AM

## 2023-03-18 NOTE — Plan of Care (Signed)

## 2023-03-18 NOTE — Plan of Care (Signed)
  Problem: Clinical Measurements: Goal: Ability to maintain clinical measurements within normal limits will improve Outcome: Progressing   Problem: Activity: Goal: Risk for activity intolerance will decrease Outcome: Progressing   Problem: Coping: Goal: Level of anxiety will decrease Outcome: Progressing   Problem: Pain Management: Goal: General experience of comfort will improve Outcome: Progressing   Problem: Cardiac: Goal: Ability to achieve and maintain adequate cardiopulmonary perfusion will improve Outcome: Progressing

## 2023-03-19 ENCOUNTER — Inpatient Hospital Stay (HOSPITAL_COMMUNITY): Payer: Medicare PPO

## 2023-03-19 DIAGNOSIS — R55 Syncope and collapse: Secondary | ICD-10-CM | POA: Diagnosis not present

## 2023-03-19 LAB — URINALYSIS, ROUTINE W REFLEX MICROSCOPIC
Bilirubin Urine: NEGATIVE
Glucose, UA: 150 mg/dL — AB
Hgb urine dipstick: NEGATIVE
Ketones, ur: NEGATIVE mg/dL
Leukocytes,Ua: NEGATIVE
Nitrite: NEGATIVE
Protein, ur: NEGATIVE mg/dL
Specific Gravity, Urine: 1.013 (ref 1.005–1.030)
pH: 5 (ref 5.0–8.0)

## 2023-03-19 LAB — HEPATIC FUNCTION PANEL
ALT: 12 U/L (ref 0–44)
AST: 19 U/L (ref 15–41)
Albumin: 3.5 g/dL (ref 3.5–5.0)
Alkaline Phosphatase: 57 U/L (ref 38–126)
Bilirubin, Direct: 0.6 mg/dL — ABNORMAL HIGH (ref 0.0–0.2)
Indirect Bilirubin: 1.1 mg/dL — ABNORMAL HIGH (ref 0.3–0.9)
Total Bilirubin: 1.7 mg/dL — ABNORMAL HIGH (ref <1.2)
Total Protein: 7.3 g/dL (ref 6.5–8.1)

## 2023-03-19 LAB — BASIC METABOLIC PANEL
Anion gap: 12 (ref 5–15)
BUN: 29 mg/dL — ABNORMAL HIGH (ref 8–23)
CO2: 26 mmol/L (ref 22–32)
Calcium: 8.6 mg/dL — ABNORMAL LOW (ref 8.9–10.3)
Chloride: 103 mmol/L (ref 98–111)
Creatinine, Ser: 1.26 mg/dL — ABNORMAL HIGH (ref 0.44–1.00)
GFR, Estimated: 41 mL/min — ABNORMAL LOW (ref >60)
Glucose, Bld: 112 mg/dL — ABNORMAL HIGH (ref 70–99)
Potassium: 4.2 mmol/L (ref 3.5–5.1)
Sodium: 141 mmol/L (ref 135–145)

## 2023-03-19 LAB — CBC
HCT: 36.2 % (ref 36.0–46.0)
Hemoglobin: 11.4 g/dL — ABNORMAL LOW (ref 12.0–15.0)
MCH: 31.6 pg (ref 26.0–34.0)
MCHC: 31.5 g/dL (ref 30.0–36.0)
MCV: 100.3 fL — ABNORMAL HIGH (ref 80.0–100.0)
Platelets: 188 10*3/uL (ref 150–400)
RBC: 3.61 MIL/uL — ABNORMAL LOW (ref 3.87–5.11)
RDW: 15 % (ref 11.5–15.5)
WBC: 17 10*3/uL — ABNORMAL HIGH (ref 4.0–10.5)
nRBC: 0 % (ref 0.0–0.2)

## 2023-03-19 LAB — TSH: TSH: 2.759 u[IU]/mL (ref 0.350–4.500)

## 2023-03-19 NOTE — Progress Notes (Signed)
PROGRESS NOTE    [name]  QQV:[id number] DOB: 08/15/1931 DOA: 03/16/2023 PCP: [name], MD     Brief Narrative:  [name] [name] is a 89 y.o. female with medical history significant for COPD, chronic respiratory failure, heart block with pacemaker, pulmonary hypertension, chronic HFpEF, atrial fibrillation on Eliquis, and CKD 4 who presents with scalp hematoma and epistaxis after a syncopal episode with fall.   Patient was at cardiac rehab and was transferring from her walker to a chair when she had a sudden loss of consciousness and fell, hitting her face and head.  She quickly regained awareness and had epistaxis and a frontal scalp hematoma.  She denies any recent chest pain.  She has not felt a shock from her cardiac device.  She reports mild dyspnea without cough, denies fever or chills, and notes that her bilateral lower extremity swelling is about the same as usual.   Upon interrogation of her ICD, was found that patient had V-fib arrest earlier and was defibrillated out of it.  Patient was admitted and cardiology consulted.  New events last 24 hours / Subjective: Continues to have significant neck pain and stiffness.  No tenderness to palpation along midline of cervical spine.  Her range of motion has improved from yesterday's exam.  Has been taking Robaxin.  States that she cannot tolerate codeine or fentanyl, does not want any narcotics due to significant nausea.  Declines to taking antiemetics with pain medication.  Assessment & Plan:   Principal Problem:   Syncope, cardiogenic Active Problems:   Coronary atherosclerosis of native coronary artery   CKD (chronic kidney disease) stage 4, GFR 15-29 ml/min (HCC)   Essential (primary) hypertension   Primary pulmonary hypertension (HCC)   Chronic atrial fibrillation (HCC)   Chronic obstructive pulmonary disease, unspecified (HCC)   Chronic respiratory failure with hypoxia (HCC)   Elevated troponin   Ventricular arrhythmia    Subdural hematoma (HCC)   Nasal fracture   Torsades, V-fib A-fib -ICD interrogation found patient had torsades de points, V-fib and received 1 shock -Tikosyn discontinued -Amiodarone -Cardiology following  Left subdural hematoma -CT head: Minimal increase in size of left tentorial leaflet subdural hematoma, now measuring up to 3 mm, previously 2 mm. No new sites of hemorrhage identified. -IV heparin currently on hold -Discussed with neurosurgery over the phone 11/27.  No surgical interventions required.  Consider repeat head imaging if patient has any mental decline.  Ideally, patient would need to be off anticoagulation for 2 weeks.  However, considering her ACS and A-fib, could consider starting short acting anticoagulation such as IV heparin or Lovenox in 3 days and monitor closely  Bilateral nasal fractures -Allow swelling to go down, she will need outpatient ENT evaluation  CAD NSTEMI -Troponin 34, 180, 243 -Lipitor  COPD with chronic hypoxic respiratory failure -Continue inhalers  Chronic diastolic CHF, pulmonary hypertension -BNP 747.5 -Torsemide, losartan  CKD stage IV -Baseline creatinine 2.63  Neck pain -Cervical spine CT negative -Continue Robaxin, K-pad  Leukocytosis -Remains afebrile, unclear if this is reactive leukocytosis in setting of torsades and ICD firing, syncope and hematoma versus infectious process -Blood cultures ordered -UA ordered -Chest x-ray without focal consolidation   DVT prophylaxis:  SCDs Start: 03/16/23 2155  Code Status: Full code Family Communication: Daughter at bedside Disposition Plan: PT OT Pending Status is: Inpatient Remains inpatient appropriate because: Further stabilization, EP plan    Antimicrobials:  Anti-infectives (From admission, onward)    None  Objective: Vitals:   03/19/23 0506 03/19/23 0600 03/19/23 1035 03/19/23 1303  BP: (!) 159/67  (!) 159/67 (!) 144/65  Pulse: 74  74 73  Resp: (!) 25   (!) 36 15  Temp: (!) 97.5 F (36.4 C)     TempSrc: Oral     SpO2: 97%  96% 94%  Weight:  71.7 kg    Height:        Intake/Output Summary (Last 24 hours) at 03/19/2023 1427 Last data filed at 03/19/2023 0500 Gross per 24 hour  Intake --  Output 1000 ml  Net -1000 ml   Filed Weights   03/17/23 0600 03/18/23 0550 03/19/23 0600  Weight: 73.5 kg 73.8 kg 71.7 kg    Examination:  General exam: Appears calm and comfortable  Respiratory system: Clear to auscultation. Respiratory effort slightly increased, tachypneic without distress Cardiovascular system: S1 & S2 heard, RR Gastrointestinal system: Abdomen is nondistended, soft and nontender.  Central nervous system: Alert and oriented. No focal neurological deficits. Speech clear.  Extremities: Right lower extremity edema with significant bruising Skin: Facial bruising Psychiatry: Judgement and insight appear normal. Mood & affect appropriate.   Data Reviewed: I have personally reviewed following labs and imaging studies  CBC: Recent Labs  Lab 03/16/23 1400 03/17/23 0352 03/18/23 0345 03/19/23 0420  WBC 10.3 9.8 13.4* 17.0*  NEUTROABS 6.0  --   --   --   HGB 12.1 10.9* 11.4* 11.4*  HCT 37.3 33.1* 35.1* 36.2  MCV 100.0 98.5 98.9 100.3*  PLT 186 160 170 188   Basic Metabolic Panel: Recent Labs  Lab 03/16/23 1400 03/17/23 0352 03/18/23 0345 03/19/23 0420  NA 134* 141 141 141  K 3.5 3.5 3.6 4.2  CL 96* 100 102 103  CO2 27 27 26 26   GLUCOSE 134* 104* 121* 112*  BUN 45* 40* 31* 29*  CREATININE 1.71* 1.65* 1.39* 1.26*  CALCIUM 9.2 9.2 9.0 8.6*  MG  --  2.5*  --   --    GFR: Estimated Creatinine Clearance: 26.8 mL/min (A) (by C-G formula based on SCr of 1.26 mg/dL (H)). Liver Function Tests: Recent Labs  Lab 03/19/23 0420  AST 19  ALT 12  ALKPHOS 57  BILITOT 1.7*  PROT 7.3  ALBUMIN 3.5   No results for input(s): "LIPASE", "AMYLASE" in the last 168 hours. No results for input(s): "AMMONIA" in the last 168  hours. Coagulation Profile: No results for input(s): "INR", "PROTIME" in the last 168 hours. Cardiac Enzymes: Recent Labs  Lab 03/16/23 1400  CKTOTAL 44   BNP (last 3 results) No results for input(s): "PROBNP" in the last 8760 hours. HbA1C: No results for input(s): "HGBA1C" in the last 72 hours. CBG: No results for input(s): "GLUCAP" in the last 168 hours. Lipid Profile: No results for input(s): "CHOL", "HDL", "LDLCALC", "TRIG", "CHOLHDL", "LDLDIRECT" in the last 72 hours. Thyroid Function Tests: Recent Labs    03/19/23 0420  TSH 2.759   Anemia Panel: No results for input(s): "VITAMINB12", "FOLATE", "FERRITIN", "TIBC", "IRON", "RETICCTPCT" in the last 72 hours. Sepsis Labs: No results for input(s): "PROCALCITON", "LATICACIDVEN" in the last 168 hours.  Recent Results (from the past 240 hour(s))  Culture, blood (Routine X 2) w Reflex to ID Panel     Status: None (Preliminary result)   Collection Time: 03/19/23  8:34 AM   Specimen: BLOOD  Result Value Ref Range Status   Specimen Description BLOOD BLOOD LEFT HAND  Final   Special Requests   Final  BOTTLES DRAWN AEROBIC AND ANAEROBIC Blood Culture results may not be optimal due to an inadequate volume of blood received in culture bottles   Culture   Final    NO GROWTH <12 HOURS Performed at [facility], [address], [place], [place] [address]    Report Status PENDING  Incomplete      Radiology Studies: DG CHEST PORT 1 VIEW  Result Date: 03/19/2023 CLINICAL DATA:  Leukocytosis. EXAM: PORTABLE CHEST 1 VIEW COMPARISON:  03/16/2023. FINDINGS: There is mild pulmonary vascular congestion. There are bilateral small layering pleural effusions. There are bibasilar opacities, which may represent atelectasis and/or consolidation. Correlate clinically. Bilateral lung fields are otherwise clear. No pneumothorax. Stable cardio-mediastinal silhouette. There are surgical staples along the heart border and sternotomy wires,  status post CABG (coronary artery bypass graft). There is a left sided 3-lead pacemaker. No acute osseous abnormalities. The soft tissues are within normal limits. IMPRESSION: *Findings favor mild congestive heart failure/pulmonary edema. Electronically Signed   By: Jules Schick M.D.   On: 03/19/2023 11:56      Scheduled Meds:  amiodarone  400 mg Oral BID   atorvastatin  20 mg Oral QHS   fluticasone furoate-vilanterol  1 puff Inhalation Daily   And   umeclidinium bromide  1 puff Inhalation Daily   losartan  25 mg Oral Daily   sodium chloride flush  3 mL Intravenous Q12H   torsemide  100 mg Oral Daily   Continuous Infusions:   LOS: 3 days   Time spent: 35 minutes   Noralee Stain, DO [facility] 03/19/2023, 2:27 PM   Available via Epic secure chat 7am-7pm After these hours, please refer to coverage provider listed on [email or web address]

## 2023-03-19 NOTE — Evaluation (Signed)
Occupational Therapy Evaluation Patient Details Name: [name] [name] MRN: [id number] DOB: 06-04-1931 Today's Date: 03/19/2023   History of Present Illness [name] [name] is a 89 y.o. female who presents with scalp hematoma and epistaxis after a syncopal episode with fall.  CT head: Minimal increase in size of left tentorial leaflet subdural hematoma and bilateral nasal fractures.  Past medical history significant for COPD, chronic respiratory failure, heart block with pacemaker, pulmonary hypertension, chronic HFpEF, atrial fibrillation on Eliquis, and CKD 4.   Clinical Impression   Pt severely limited by neck pain with any movement or transition from supine to sit, sitting, or standing.  Max assist +2 for bed mobility with mod assist for sit to stand from EOB.  Mod +2 for simulated selfcare sit to stand.  Pt maintains slight right head rotation at rest but was unable to actively tolerate rotation back to midline and could not achieve active neutral extension.  Prior to admission she was modified independent with mobility using her rollator in her apartment at [facility] in [place] with assist for some bathing tasks.  Currently, feel she will benefit from acute care OT to work on progression back to this level for return back to ALF.  Based on pain and current functional limitations feel, will benefit from continued inpatient follow up therapy, <3 hours/day prior to transition back to [facility].          If plan is discharge home, recommend the following: A lot of help with bathing/dressing/bathroom;Assistance with cooking/housework;Assist for transportation;Help with stairs or ramp for entrance;A lot of help with walking and/or transfers    Functional Status Assessment  Patient has had a recent decline in their functional status and demonstrates the ability to make significant improvements in function in a reasonable and predictable amount of time.  Equipment Recommendations  Other (comment)  (TBD next venue of care)       Precautions / Restrictions Precautions Precautions: Fall Precaution Comments: severe neck pain Restrictions Weight Bearing Restrictions: No      Mobility Bed Mobility Overal bed mobility: Needs Assistance Bed Mobility: Supine to Sit, Sit to Supine     Supine to sit: Max assist, +2 for safety/equipment, +2 for physical assistance, HOB elevated Sit to supine: Max assist, +2 for physical assistance   General bed mobility comments: Pt with decreased ability to assist secondary to pain.  Assist needed for bringing trunk up to sitting and LEs/hips to EOB.    Transfers Overall transfer level: Needs assistance Equipment used: None Transfers: Sit to/from Stand Sit to Stand: Mod assist           General transfer comment: Mod assist for standing from EOB with BUE support on therapist's forearms and therapist assist at trunk.  Able to take two small steps up toward the top of the bed.      Balance Overall balance assessment: Needs assistance Sitting-balance support: Bilateral upper extremity supported Sitting balance-Leahy Scale: Fair     Standing balance support: Bilateral upper extremity supported, During functional activity Standing balance-Leahy Scale: Poor Standing balance comment: Mod assist from therapist to maintain standing balance.                           ADL either performed or assessed with clinical judgement   ADL Overall ADL's : Needs assistance/impaired Eating/Feeding: Set up;Bed level Eating/Feeding Details (indicate cue type and reason): simulated Grooming: Wash/dry hands;Wash/dry face;Bed level Grooming Details (indicate cue type and  reason): simulated HOB elevated Upper Body Bathing: Supervision/ safety;Bed level Upper Body Bathing Details (indicate cue type and reason): simulated HOB elevated Lower Body Bathing: Moderate assistance;+2 for physical assistance Lower Body Bathing Details (indicate cue type and  reason): simulated sit to stand Upper Body Dressing : Minimal assistance;Sitting Upper Body Dressing Details (indicate cue type and reason): simulated Lower Body Dressing: Maximal assistance;Sit to/from stand Lower Body Dressing Details (indicate cue type and reason): simulated Toilet Transfer: Moderate assistance;Stand-pivot Toilet Transfer Details (indicate cue type and reason): simulated stepping up the EOB Toileting- Clothing Manipulation and Hygiene: Moderate assistance;+2 for physical assistance;Sit to/from stand       Functional mobility during ADLs: Moderate assistance (standing EOB and taking a couple steps up toward the top of the bed.) General ADL Comments: Pt currently limited with all movement by severe pain in her neck.  Daughter present and supportive.  Messaged MD and she ordered Aquathermia pad to be placed for pain.  Max +2 for supine to sit secondary to increased pain with pt declining movement and needing max encouragement and support throughout.  Oxygen 88-92% throughout on 3Ls nasal cannula with HR in the mid 70's.  Pt able to sit EOB with min guard assist but demonstrates only slight rotation actively to the right in her neck.  when asked to turn to the left or extend her head, movements is minimal.  Encouraged work on stretching with rotation to the left when in bed as well as some extension.     Vision Baseline Vision/History: 0 No visual deficits Ability to See in Adequate Light: 0 Adequate Patient Visual Report: No change from baseline Vision Assessment?: No apparent visual deficits     Perception Perception: Within Functional Limits       Praxis Praxis: WFL       Pertinent Vitals/Pain Pain Assessment Pain Assessment: Faces Faces Pain Scale: Hurts even more Pain Location: neck Pain Descriptors / Indicators: Aching, Discomfort Pain Intervention(s): Limited activity within patient's tolerance     Extremity/Trunk Assessment Upper Extremity  Assessment Upper Extremity Assessment: Generalized weakness (Grip strength 4/5, able to spontaneously move her UEs to assist with rolling and mobility, but limited by neck pain.  Did not attempt further MMT.)   Lower Extremity Assessment Lower Extremity Assessment: Defer to PT evaluation   Cervical / Trunk Assessment Cervical / Trunk Assessment: Other exceptions Cervical / Trunk Exceptions: Pt with head turned right and flexed in supine and sitting.  Unable to achieve neutral extension when sitting EOB secondary to pain and only able to turn from the right to within approximately 5 degrees of midline with rotation.   Communication Communication Communication: Hearing impairment   Cognition Arousal: Alert Behavior During Therapy: WFL for tasks assessed/performed Overall Cognitive Status: Within Functional Limits for tasks assessed                                                  Home Living Family/patient expects to be discharged to:: Assisted living Secretary/administrator living)                   Bathroom Shower/Tub: Walk-in shower   Bathroom Toilet: Handicapped height     Home Equipment: Rollator (4 wheels);Rolling Walker (2 wheels);Shower seat;Grab bars - tub/shower;Toilet riser   Additional Comments: She would use her rollator to ambulate to the dinning hall  without assist for meals.      Prior Functioning/Environment Prior Level of Function : Independent/Modified Independent             Mobility Comments: uses rollator in the apartment without assist ADLs Comments: They assisted with bathing but she was able to complete dressing.        OT Problem List: Decreased strength;Decreased range of motion;Decreased activity tolerance;Impaired balance (sitting and/or standing);Pain;Decreased knowledge of use of DME or AE;Impaired UE functional use      OT Treatment/Interventions: Self-care/ADL training;DME and/or AE instruction;Therapeutic  activities;Balance training;Patient/family education;Therapeutic exercise    OT Goals(Current goals can be found in the care plan section) Acute Rehab OT Goals Patient Stated Goal: Pt wants her neck to stop hurting. OT Goal Formulation: With patient Time For Goal Achievement: 04/02/23 Potential to Achieve Goals: Good  OT Frequency: Min 1X/week       AM-PAC OT "6 Clicks" Daily Activity     Outcome Measure Help from another person eating meals?: A Little Help from another person taking care of personal grooming?: A Little Help from another person toileting, which includes using toliet, bedpan, or urinal?: A Lot Help from another person bathing (including washing, rinsing, drying)?: A Lot Help from another person to put on and taking off regular upper body clothing?: A Little Help from another person to put on and taking off regular lower body clothing?: A Lot 6 Click Score: 15   End of Session Equipment Utilized During Treatment: Oxygen Nurse Communication: Mobility status (pain level)  Activity Tolerance: Patient limited by pain Patient left: in bed;with call bell/phone within reach;with family/visitor present  OT Visit Diagnosis: Unsteadiness on feet (R26.81);Other abnormalities of gait and mobility (R26.89);Muscle weakness (generalized) (M62.81);History of falling (Z91.81);Pain Pain - Right/Left: Right Pain - part of body: Knee (neck pain)                Time: 5409-8119 OT Time Calculation (min): 52 min Charges:  OT General Charges $OT Visit: 1 Visit OT Evaluation $OT Eval Moderate Complexity: 1 Mod OT Treatments $Self Care/Home Management : 23-37 mins  Perrin Maltese, OTR/L Acute Rehabilitation Services  Office [phone] 03/19/2023

## 2023-03-19 NOTE — Progress Notes (Signed)
  Patient Name: [name] [name] Date of Encounter: 03/19/2023  Primary Cardiologist: Nona Dell, MD Electrophysiologist: Lewayne Bunting, MD  Interval Summary   The patient reports her neck and knee are sore today.  She states she took a muscle relaxer last night.   At this time, the patient denies chest pain, shortness of breath, or any new concerns.  Vital Signs    Vitals:   03/18/23 2054 03/19/23 0110 03/19/23 0506 03/19/23 0600  BP: 95/82 135/73 (!) 159/67   Pulse: 74 77 74   Resp: 20 (!) 24 (!) 25   Temp: 97.6 F (36.4 C) (!) 97.5 F (36.4 C) (!) 97.5 F (36.4 C)   TempSrc: Oral Oral Oral   SpO2: 100% 100% 97%   Weight:    71.7 kg  Height:        Intake/Output Summary (Last 24 hours) at 03/19/2023 0715 Last data filed at 03/19/2023 0500 Gross per 24 hour  Intake 120 ml  Output 1000 ml  Net -880 ml   Filed Weights   03/17/23 0600 03/18/23 0550 03/19/23 0600  Weight: 73.5 kg 73.8 kg 71.7 kg    Physical Exam    GEN- chronically ill appearing elderly adult female in NAD, alert and oriented x 3 today.   HEENT: facial bruising Lungs- prolonged exp phase, diminished breath sounds but good air entry  Cardiac- Regular rate and rhythm, no murmurs, rubs or gallops GI- soft, NT, ND, + BS Extremities- no clubbing or cyanosis. No edema  Telemetry    VP 70's, occ PVC's, 2 episodes of NSVT (personally reviewed)  Hospital Course    [name] [name] is a 89 y.o. female with PMH chronic atrial fibrillation on Tikosyn, CHB, CAD s/p CABG (1998, LIMA-LAD, SVG-Diag, SVG-RI-OM + DES to PLA in 2006) , VT/VF s/p ICD, 3L O2 dependent severe COPD, CKD IV & anxiety who was admitted on 11/27 after an ICD shock at [facility]monary Rehab.  Device interrogation showed appropriate shock 35j for PMVT/VF. Tikosyn was stopped on admission. Unclear if this is true TdP on Tikosyn or just R on T mediated from PVCs. QT when corrected for paced QRS duration was not particularly impressive. Given presence of  rising troponin and PMVT, case was discussed with IC who do not feel benefit of LHC +/- PCI would outweigh risks at this time, especially in setting of active subdural hematoma.   Assessment & Plan    Polymorphic VT/VF s/p ICD Shock NSVT, PVC's  Cardiogenic Syncope Preserved LVEF. Shock x1 with 35j. Appears PVC started event. Device interrogation shows NSVT episodes began MN of 11/26 but were short. Shock occurred at 130PM (not 230 / device clock was not updated).  EGM review confirms torsades / VT   -amiodarone 400mg  BID x5 days, then 200 mg BID for 2 weeks, then 200 daily  -LRL on device increased on admit for overdrive suppression  -hold anticoagulation in setting of SDH -avoid QT prolonging agents   Persistent Atrial Fibrillation  Tikosyn stopped in setting of PMVT -continue amiodarone as above   Traumatic SDH Nasal Fractures -per primary  -primary reviewed with NSGY > rec's to hold OAC for minimum of 3d, ideally for 2 weeks   For questions or updates, please contact [facility] Please consult [email or web address] for contact info under Cardiology/STEMI.  Signed, Canary Brim, MSN, APRN, NP-C, AGACNP-BC [facility] - Electrophysiology  03/19/2023, 7:59 AM

## 2023-03-19 NOTE — Plan of Care (Signed)

## 2023-03-19 NOTE — Progress Notes (Signed)
PT Cancellation Note  Patient Details Name: KRISTAN LAURENS MRN: [id number] DOB: 05/09/1931   Cancelled Treatment:    Reason Eval/Treat Not Completed: Patient declined, no reason specified  Declined PT this afternoon due to fatigue- "I didn't sleep at all last night and I just don't have it in me to do much more now". Agreeable to PT returning tomorrow morning.   Nedra Hai, PT, DPT 03/19/23 12:56 PM

## 2023-03-20 ENCOUNTER — Inpatient Hospital Stay (HOSPITAL_COMMUNITY): Payer: Medicare PPO

## 2023-03-20 DIAGNOSIS — M7989 Other specified soft tissue disorders: Secondary | ICD-10-CM | POA: Diagnosis not present

## 2023-03-20 DIAGNOSIS — R55 Syncope and collapse: Secondary | ICD-10-CM | POA: Diagnosis not present

## 2023-03-20 DIAGNOSIS — I469 Cardiac arrest, cause unspecified: Secondary | ICD-10-CM

## 2023-03-20 LAB — CBC
HCT: 35.1 % — ABNORMAL LOW (ref 36.0–46.0)
Hemoglobin: 11 g/dL — ABNORMAL LOW (ref 12.0–15.0)
MCH: 31.3 pg (ref 26.0–34.0)
MCHC: 31.3 g/dL (ref 30.0–36.0)
MCV: 99.7 fL (ref 80.0–100.0)
Platelets: 200 10*3/uL (ref 150–400)
RBC: 3.52 MIL/uL — ABNORMAL LOW (ref 3.87–5.11)
RDW: 15.2 % (ref 11.5–15.5)
WBC: 16.2 10*3/uL — ABNORMAL HIGH (ref 4.0–10.5)
nRBC: 0 % (ref 0.0–0.2)

## 2023-03-20 LAB — BASIC METABOLIC PANEL
Anion gap: 9 (ref 5–15)
BUN: 33 mg/dL — ABNORMAL HIGH (ref 8–23)
CO2: 29 mmol/L (ref 22–32)
Calcium: 8.5 mg/dL — ABNORMAL LOW (ref 8.9–10.3)
Chloride: 102 mmol/L (ref 98–111)
Creatinine, Ser: 1.31 mg/dL — ABNORMAL HIGH (ref 0.44–1.00)
GFR, Estimated: 39 mL/min — ABNORMAL LOW (ref >60)
Glucose, Bld: 107 mg/dL — ABNORMAL HIGH (ref 70–99)
Potassium: 3.8 mmol/L (ref 3.5–5.1)
Sodium: 140 mmol/L (ref 135–145)

## 2023-03-20 LAB — ECHOCARDIOGRAM COMPLETE
Height: 60 in
S' Lateral: 2.4 cm
Single Plane A4C EF: 52 %
Weight: 2560.86 [oz_av]

## 2023-03-20 MED ORDER — BISOPROLOL FUMARATE 5 MG PO TABS
2.5000 mg | ORAL_TABLET | Freq: Every day | ORAL | Status: DC
  Administered 2023-03-20 – 2023-03-21 (×2): 2.5 mg via ORAL
  Filled 2023-03-20 (×2): qty 1

## 2023-03-20 MED ORDER — PERFLUTREN LIPID MICROSPHERE
1.0000 mL | INTRAVENOUS | Status: AC | PRN
  Administered 2023-03-20: 4 mL via INTRAVENOUS

## 2023-03-20 NOTE — Progress Notes (Signed)
PROGRESS NOTE    [name] [name]  QVZ:[id number] DOB: 1931-05-29 DOA: 03/16/2023 PCP: [name], MD     Brief Narrative:  [name] [name] is a 89 y.o. female with medical history significant for COPD, chronic respiratory failure, heart block with pacemaker, pulmonary hypertension, chronic HFpEF, atrial fibrillation on Eliquis, and CKD 4 who presents with scalp hematoma and epistaxis after a syncopal episode with fall.   Patient was at cardiac rehab and was transferring from her walker to a chair when she had a sudden loss of consciousness and fell, hitting her face and head.  She quickly regained awareness and had epistaxis and a frontal scalp hematoma.  She denies any recent chest pain.  She has not felt a shock from her cardiac device.  She reports mild dyspnea without cough, denies fever or chills, and notes that her bilateral lower extremity swelling is about the same as usual.   Upon interrogation of her ICD, was found that patient had V-fib arrest earlier and was defibrillated out of it.  Patient was admitted and cardiology consulted.  New events last 24 hours / Subjective: Denies any headaches.  Continues to have neck pain, range of motion improved from exam couple days ago.  Has been tolerating Dilaudid without significant nausea  Assessment & Plan:   Principal Problem:   Syncope, cardiogenic Active Problems:   Coronary atherosclerosis of native coronary artery   CKD (chronic kidney disease) stage 4, GFR 15-29 ml/min (HCC)   Essential (primary) hypertension   Primary pulmonary hypertension (HCC)   Chronic atrial fibrillation (HCC)   Chronic obstructive pulmonary disease, unspecified (HCC)   Chronic respiratory failure with hypoxia (HCC)   Elevated troponin   Ventricular arrhythmia   Subdural hematoma (HCC)   Nasal fracture   Cardiogenic syncope V-fib/V-tach Hx A-fib -ICD interrogation found patient had torsades de points, V-fib and received 1 shock -Tikosyn  discontinued -Amiodarone -Cardiology following -Checking echo today to reassess LV function  Left subdural hematoma -CT head: Minimal increase in size of left tentorial leaflet subdural hematoma, now measuring up to 3 mm, previously 2 mm. No new sites of hemorrhage identified. -IV heparin currently on hold -Discussed with neurosurgery over the phone 11/27.  No surgical interventions required.  Consider repeat head imaging if patient has any mental decline.  Ideally, patient would need to be off anticoagulation for 2 weeks.  However, considering her ACS and A-fib, could consider starting short acting anticoagulation such as IV heparin or Lovenox in 3 days and monitor closely  Bilateral nasal fractures -Allow swelling to go down, she will need outpatient ENT evaluation  CAD NSTEMI -Troponin 34, 180, 243 -Lipitor  COPD with chronic hypoxic respiratory failure -Continue inhalers  Chronic diastolic CHF, pulmonary hypertension -BNP 747.5 -Torsemide, losartan  CKD stage IV -Baseline creatinine 2.63  Neck pain -Cervical spine CT negative -Continue Robaxin, K-pad  Leukocytosis -Remains afebrile, unclear if this is reactive leukocytosis in setting of torsades and ICD firing, syncope and hematoma versus infectious process -Blood cultures ordered -UA ordered -Chest x-ray without focal consolidation -WBC improved without antibiotic   DVT prophylaxis:  SCDs Start: 03/16/23 2155  Code Status: Full code Family Communication: No family at bedside Disposition Plan: PT OT Pending Status is: Inpatient Remains inpatient appropriate because: Further stabilization, EP plan    Antimicrobials:  Anti-infectives (From admission, onward)    None        Objective: Vitals:   03/20/23 0350 03/20/23 0719 03/20/23 0822 03/20/23 1330  BP: (!) 134/56 [name])  137/57 (!) 144/61 (!) 105/56  Pulse: 74 72 73 74  Resp: (!) 22 (!) 36 18   Temp: 98.6 F (37 C) 97.6 F (36.4 C)    TempSrc: Oral  Oral    SpO2: 95% 98% 96% 99%  Weight: 72.6 kg     Height:        Intake/Output Summary (Last 24 hours) at 03/20/2023 1358 Last data filed at 03/20/2023 0355 Gross per 24 hour  Intake --  Output 1000 ml  Net -1000 ml   Filed Weights   03/18/23 0550 03/19/23 0600 03/20/23 0350  Weight: 73.8 kg 71.7 kg 72.6 kg    Examination:  General exam: Appears calm and comfortable  Respiratory system: Clear to auscultation anteriorly, tachypneic without distress Cardiovascular system: S1 & S2 heard, RR Gastrointestinal system: Abdomen is nondistended, soft and nontender.  Central nervous system: Alert and oriented. No focal neurological deficits. Speech clear.  Extremities: Right lower extremity edema with significant bruising Skin: Facial bruising Psychiatry: Judgement and insight appear normal. Mood & affect appropriate.   Data Reviewed: I have personally reviewed following labs and imaging studies  CBC: Recent Labs  Lab 03/16/23 1400 03/17/23 0352 03/18/23 0345 03/19/23 0420 03/20/23 0320  WBC 10.3 9.8 13.4* 17.0* 16.2*  NEUTROABS 6.0  --   --   --   --   HGB 12.1 10.9* 11.4* 11.4* 11.0*  HCT 37.3 33.1* 35.1* 36.2 35.1*  MCV 100.0 98.5 98.9 100.3* 99.7  PLT 186 160 170 188 200   Basic Metabolic Panel: Recent Labs  Lab 03/16/23 1400 03/17/23 0352 03/18/23 0345 03/19/23 0420 03/20/23 0320  NA 134* 141 141 141 140  K 3.5 3.5 3.6 4.2 3.8  CL 96* 100 102 103 102  CO2 27 27 26 26 29   GLUCOSE 134* 104* 121* 112* 107*  BUN 45* 40* 31* 29* 33*  CREATININE 1.71* 1.65* 1.39* 1.26* 1.31*  CALCIUM 9.2 9.2 9.0 8.6* 8.5*  MG  --  2.5*  --   --   --    GFR: Estimated Creatinine Clearance: 25.9 mL/min (A) (by C-G formula based on SCr of 1.31 mg/dL (H)). Liver Function Tests: Recent Labs  Lab 03/19/23 0420  AST 19  ALT 12  ALKPHOS 57  BILITOT 1.7*  PROT 7.3  ALBUMIN 3.5   No results for input(s): "LIPASE", "AMYLASE" in the last 168 hours. No results for input(s):  "AMMONIA" in the last 168 hours. Coagulation Profile: No results for input(s): "INR", "PROTIME" in the last 168 hours. Cardiac Enzymes: Recent Labs  Lab 03/16/23 1400  CKTOTAL 44   BNP (last 3 results) No results for input(s): "PROBNP" in the last 8760 hours. HbA1C: No results for input(s): "HGBA1C" in the last 72 hours. CBG: No results for input(s): "GLUCAP" in the last 168 hours. Lipid Profile: No results for input(s): "CHOL", "HDL", "LDLCALC", "TRIG", "CHOLHDL", "LDLDIRECT" in the last 72 hours. Thyroid Function Tests: Recent Labs    03/19/23 0420  TSH 2.759   Anemia Panel: No results for input(s): "VITAMINB12", "FOLATE", "FERRITIN", "TIBC", "IRON", "RETICCTPCT" in the last 72 hours. Sepsis Labs: No results for input(s): "PROCALCITON", "LATICACIDVEN" in the last 168 hours.  Recent Results (from the past 240 hour(s))  Culture, blood (Routine X 2) w Reflex to ID Panel     Status: None (Preliminary result)   Collection Time: 03/19/23  8:34 AM   Specimen: BLOOD  Result Value Ref Range Status   Specimen Description BLOOD BLOOD LEFT HAND  Final  Special Requests   Final    BOTTLES DRAWN AEROBIC AND ANAEROBIC Blood Culture results may not be optimal due to an inadequate volume of blood received in culture bottles   Culture   Final    NO GROWTH < 24 HOURS Performed at [facility], [address], [place], [place] [address]    Report Status PENDING  Incomplete  Culture, blood (Routine X 2) w Reflex to ID Panel     Status: None (Preliminary result)   Collection Time: 03/19/23  8:47 AM   Specimen: BLOOD  Result Value Ref Range Status   Specimen Description BLOOD BLOOD RIGHT HAND  Final   Special Requests   Final    BOTTLES DRAWN AEROBIC AND ANAEROBIC Blood Culture results may not be optimal due to an inadequate volume of blood received in culture bottles   Culture   Final    NO GROWTH < 24 HOURS Performed at [facility], [address] [place], [place]  [address]    Report Status PENDING  Incomplete      Radiology Studies: DG CHEST PORT 1 VIEW  Result Date: 03/19/2023 CLINICAL DATA:  Leukocytosis. EXAM: PORTABLE CHEST 1 VIEW COMPARISON:  03/16/2023. FINDINGS: There is mild pulmonary vascular congestion. There are bilateral small layering pleural effusions. There are bibasilar opacities, which may represent atelectasis and/or consolidation. Correlate clinically. Bilateral lung fields are otherwise clear. No pneumothorax. Stable cardio-mediastinal silhouette. There are surgical staples along the heart border and sternotomy wires, status post CABG (coronary artery bypass graft). There is a left sided 3-lead pacemaker. No acute osseous abnormalities. The soft tissues are within normal limits. IMPRESSION: *Findings favor mild congestive heart failure/pulmonary edema. Electronically Signed   By: Jules Schick M.D.   On: 03/19/2023 11:56      Scheduled Meds:  amiodarone  400 mg Oral BID   atorvastatin  20 mg Oral QHS   bisoprolol  2.5 mg Oral Daily   fluticasone furoate-vilanterol  1 puff Inhalation Daily   And   umeclidinium bromide  1 puff Inhalation Daily   sodium chloride flush  3 mL Intravenous Q12H   torsemide  100 mg Oral Daily   Continuous Infusions:   LOS: 4 days   Time spent: 25 minutes   Noralee Stain, DO [facility] 03/20/2023, 1:58 PM   Available via Epic secure chat 7am-7pm After these hours, please refer to coverage provider listed on [email or web address]

## 2023-03-20 NOTE — Progress Notes (Signed)
  Echocardiogram 2D Echocardiogram has been performed.  Delcie Roch 03/20/2023, 2:49 PM

## 2023-03-20 NOTE — Evaluation (Signed)
Physical Therapy Evaluation Patient Details Name: [name] [name] MRN: [id number] DOB: [birth year] Today's Date: 03/20/2023  History of Present Illness  [name] [name] is a 89 y.o. female who presents with scalp hematoma and epistaxis after a syncopal episode with fall.  CT head: Minimal increase in size of left tentorial leaflet subdural hematoma and bilateral nasal fractures.  Past medical history significant for COPD, chronic respiratory failure, heart block with pacemaker, pulmonary hypertension, chronic HFpEF, atrial fibrillation on Eliquis, and CKD 4.   Clinical Impression  [name] [name] is 89 y.o. female admitted with above HPI and diagnosis. Patient is currently limited by functional impairments below (see PT problem list). Patient lives at [facility] and is mod ind with rollator at baseline to mobilize around room and facility. Currently pt limited by neck pain and requires Mod assist for supine>sit and sit<>stand with RW. Min assist to guide walker and step bed>chair. Pt perseverating on neck pain and repeatedly asking her son to provide support to her head/neck with mobility. Patient will benefit from continued skilled PT interventions to address impairments and progress independence with mobility, recommending return to ALF with 24/7 assist and [facility] services. Acute PT will follow and progress as able.         If plan is discharge home, recommend the following: A lot of help with walking and/or transfers;A lot of help with bathing/dressing/bathroom;Assistance with cooking/housework;Direct supervision/assist for medications management;Help with stairs or ramp for entrance;Assist for transportation   Can travel by private vehicle        Equipment Recommendations None recommended by PT  Recommendations for Other Services       Functional Status Assessment Patient has had a recent decline in their functional status and demonstrates the ability to make significant improvements in function in  a reasonable and predictable amount of time.     Precautions / Restrictions Precautions Precautions: Fall Precaution Comments: severe neck pain Restrictions Weight Bearing Restrictions: No      Mobility  Bed Mobility Overal bed mobility: Needs Assistance Bed Mobility: Supine to Sit, Sit to Supine     Supine to sit: Mod assist, +2 for safety/equipment, HOB elevated, Used rails     General bed mobility comments: cues for step by step sequencing to walk LE's off EOB, and reach for bed rail. bed pad used to adjsut/pivot hips. pt hyperfocused on support for her neck due to pain.    Transfers Overall transfer level: Needs assistance Equipment used: Rolling walker (2 wheels) Transfers: Sit to/from Stand, Bed to chair/wheelchair/BSC Sit to Stand: Min assist, +2 safety/equipment   Step pivot transfers: Min assist, +2 safety/equipment       General transfer comment: Pt using bil UE to scoot hips anteriorly at EOB. pt using bil UE on RW for power up despite cues for hand placement from therapist. Min assist to rise and steady. min assist to manage RW with turn to step bed>chair.    Ambulation/Gait                  Stairs            Wheelchair Mobility     Tilt Bed    Modified Rankin (Stroke Patients Only)       Balance Overall balance assessment: Needs assistance Sitting-balance support: Bilateral upper extremity supported, Feet supported, Feet unsupported Sitting balance-Leahy Scale: Fair     Standing balance support: Bilateral upper extremity supported, During functional activity Standing balance-Leahy Scale: Poor Standing balance comment: reliant  on RW and therapist                             Pertinent Vitals/Pain Pain Assessment Pain Assessment: Faces Faces Pain Scale: Hurts even more Pain Location: neck; Lt side Pain Descriptors / Indicators: Aching, Discomfort Pain Intervention(s): Monitored during session, Limited activity  within patient's tolerance, Premedicated before session, Repositioned, Heat applied    Home Living Family/patient expects to be discharged to:: Assisted living [facility] for the last 6 months)                 Home Equipment: Rollator (4 wheels);Rolling Walker (2 wheels);Shower seat;Grab bars - tub/shower;Toilet riser Additional Comments: She would use her rollator to ambulate to the dinning hall without assist for meals.    Prior Function Prior Level of Function : Independent/Modified Independent             Mobility Comments: uses rollator in the apartment without assist ADLs Comments: They assisted with bathing but she was able to complete dressing.     Extremity/Trunk Assessment   Upper Extremity Assessment Upper Extremity Assessment: Defer to OT evaluation    Lower Extremity Assessment Lower Extremity Assessment: Generalized weakness    Cervical / Trunk Assessment Cervical / Trunk Assessment: Other exceptions Cervical / Trunk Exceptions: resting head slightly Rt rotation from neutral/midline.  Communication   Communication Communication: Hearing impairment  Cognition Arousal: Alert Behavior During Therapy: Anxious, WFL for tasks assessed/performed Overall Cognitive Status: Within Functional Limits for tasks assessed                                 General Comments: ovearll WFL's slightly anxious regarding neck pain with mobility        General Comments      Exercises     Assessment/Plan    PT Assessment Patient needs continued PT services  PT Problem List Decreased strength;Decreased range of motion;Decreased activity tolerance;Decreased balance;Decreased mobility;Decreased knowledge of use of DME;Decreased safety awareness;Decreased knowledge of precautions;Cardiopulmonary status limiting activity;Pain;Decreased skin integrity       PT Treatment Interventions DME instruction;Gait training;Stair training;Functional mobility  training;Therapeutic activities;Therapeutic exercise;Balance training;Neuromuscular re-education;Cognitive remediation;Patient/family education;Manual techniques;Modalities    PT Goals (Current goals can be found in the Care Plan section)  Acute Rehab PT Goals Patient Stated Goal: neck to stop hurting, regain mobility PT Goal Formulation: With patient/family Time For Goal Achievement: 04/03/23 Potential to Achieve Goals: Good    Frequency Min 1X/week     Co-evaluation               AM-PAC PT "6 Clicks" Mobility  Outcome Measure Help needed turning from your back to your side while in a flat bed without using bedrails?: A Lot Help needed moving from lying on your back to sitting on the side of a flat bed without using bedrails?: A Lot Help needed moving to and from a bed to a chair (including a wheelchair)?: A Little Help needed standing up from a chair using your arms (e.g., wheelchair or bedside chair)?: A Little Help needed to walk in hospital room?: A Lot Help needed climbing 3-5 steps with a railing? : Total 6 Click Score: 13    End of Session Equipment Utilized During Treatment: Gait belt Activity Tolerance: Patient tolerated treatment well Patient left: in chair;with call bell/phone within reach;with chair alarm set;with family/visitor present Nurse Communication: Mobility status PT  Visit Diagnosis: Other abnormalities of gait and mobility (R26.89);Muscle weakness (generalized) (M62.81);Difficulty in walking, not elsewhere classified (R26.2);Pain;History of falling (Z91.81) Pain - Right/Left: Left Pain - part of body:  (neck)    Time: 9528-4132 PT Time Calculation (min) (ACUTE ONLY): 40 min   Charges:   PT Evaluation $PT Eval Moderate Complexity: 1 Mod PT Treatments $Therapeutic Activity: 8-22 mins PT General Charges $$ ACUTE PT VISIT: 1 Visit         Wynn Maudlin, DPT Acute Rehabilitation Services Office [phone]  03/20/23 12:56 PM

## 2023-03-20 NOTE — Progress Notes (Signed)
RLE venous duplex has been completed, preliminary results given to Rocky Link, Charity fundraiser.    Results can be found under chart review under CV PROC. 03/20/2023 2:28 PM Eeva Schlosser RVT, RDMS

## 2023-03-20 NOTE — Progress Notes (Signed)
  Patient Name: [name] [name] Date of Encounter: 03/20/2023  Primary Cardiologist: Nona Dell, MD Electrophysiologist: Lewayne Bunting, MD    Hospital Course    [name] [name] is a 89 y.o. female with PMH chronic atrial fibrillation on Tikosyn, CHB, CAD s/p CABG (1998, LIMA-LAD, SVG-Diag, SVG-RI-OM + DES to PLA in 2006) , VT/VF s/p ICD, 3L O2 dependent severe COPD, CKD IV & anxiety who was admitted on 11/27 after an ICD shock at [facility].  Device interrogation showed appropriate shock 35j for PMVT/VF. Tikosyn was stopped on admission.  Now on amiodarone Review of the strips (SK) not consistent with torsade de pointes>> Subdural hematoma with mild interval worsening 11/27   Interval Summary   The patient denies chest pain\, shortness of breath, nocturnal dyspnea, orthopnea .  Complains of pain in her neck with moving.   Vital Signs    Vitals:   03/19/23 1957 03/20/23 0350 03/20/23 0719 03/20/23 0822  BP: 124/62 (!) 134/56 (!) 137/57 (!) 144/61  Pulse: 75 74 72 73  Resp: (!) 23 (!) 22 (!) 36 18  Temp: 98.2 F (36.8 C) 98.6 F (37 C) 97.6 F (36.4 C)   TempSrc: Oral Oral Oral   SpO2: 97% 95% 98% 96%  Weight:  72.6 kg    Height:        Intake/Output Summary (Last 24 hours) at 03/20/2023 1124 Last data filed at 03/20/2023 0355 Gross per 24 hour  Intake --  Output 1000 ml  Net -1000 ml   Filed Weights   03/18/23 0550 03/19/23 0600 03/20/23 0350  Weight: 73.8 kg 71.7 kg 72.6 kg    Physical Exam    Well developed and nourished in no acute distress HENT normal facial bruising Neck supple   Clear Regular rate and rhythm, no murmurs or gallops Abd-soft with active BS No Clubbing cyanosis swelling and redness of her right lower extremity with edema Skin-warm and dry A & Oriented  Grossly normal sensory and motor function      Assessment & Plan    Polymorphic VT/VF s/p ICD Shock Ischemic heart disease with prior bypass NSVT, PVC's  Cardiogenic  Syncope Atrial fibrillation-persistent  Subdural-traumatic Nasal fractures   Given the mechanism of the polymorphic VT, i.e. not short long and not late coupled, it is not consistent with torsades.  The more likely mechanism is ischemia.  Amiodarone was originally developed as an anti-ischemic agent; however, augmented anti-ischemic therapy would be appropriate.  Will repeat the echocardiogram to reassess LV function.  Will discontinue the losartan and resume her low-dose bisoprolol at 2.5 mg daily.  If blood pressure tolerates will augment  Will need repeat imaging prob toknow when to resume anticoagulation as interval study had showed worsening   Right lower extremity swelling is notable.  Concerned about a DVT.  Will be complicated and would likely need a filter if positive  Sherryl Manges  03/20/2023, 11:24 AM

## 2023-03-21 ENCOUNTER — Inpatient Hospital Stay (HOSPITAL_COMMUNITY): Payer: Medicare PPO

## 2023-03-21 DIAGNOSIS — R55 Syncope and collapse: Secondary | ICD-10-CM | POA: Diagnosis not present

## 2023-03-21 LAB — CBC
HCT: 33.6 % — ABNORMAL LOW (ref 36.0–46.0)
Hemoglobin: 10.9 g/dL — ABNORMAL LOW (ref 12.0–15.0)
MCH: 32.2 pg (ref 26.0–34.0)
MCHC: 32.4 g/dL (ref 30.0–36.0)
MCV: 99.1 fL (ref 80.0–100.0)
Platelets: 212 10*3/uL (ref 150–400)
RBC: 3.39 MIL/uL — ABNORMAL LOW (ref 3.87–5.11)
RDW: 14.9 % (ref 11.5–15.5)
WBC: 14.7 10*3/uL — ABNORMAL HIGH (ref 4.0–10.5)
nRBC: 0 % (ref 0.0–0.2)

## 2023-03-21 LAB — BASIC METABOLIC PANEL
Anion gap: 9 (ref 5–15)
BUN: 42 mg/dL — ABNORMAL HIGH (ref 8–23)
CO2: 26 mmol/L (ref 22–32)
Calcium: 8.6 mg/dL — ABNORMAL LOW (ref 8.9–10.3)
Chloride: 100 mmol/L (ref 98–111)
Creatinine, Ser: 1.57 mg/dL — ABNORMAL HIGH (ref 0.44–1.00)
GFR, Estimated: 31 mL/min — ABNORMAL LOW (ref >60)
Glucose, Bld: 106 mg/dL — ABNORMAL HIGH (ref 70–99)
Potassium: 4 mmol/L (ref 3.5–5.1)
Sodium: 135 mmol/L (ref 135–145)

## 2023-03-21 MED ORDER — BISOPROLOL FUMARATE 5 MG PO TABS
2.5000 mg | ORAL_TABLET | Freq: Every day | ORAL | Status: AC
  Administered 2023-03-21: 2.5 mg via ORAL
  Filled 2023-03-21: qty 1

## 2023-03-21 MED ORDER — LIDOCAINE 5 % EX PTCH
1.0000 | MEDICATED_PATCH | CUTANEOUS | Status: DC
  Administered 2023-03-21 – 2023-03-23 (×3): 1 via TRANSDERMAL
  Filled 2023-03-21 (×3): qty 1

## 2023-03-21 MED ORDER — BISOPROLOL FUMARATE 5 MG PO TABS
5.0000 mg | ORAL_TABLET | Freq: Every day | ORAL | Status: DC
  Administered 2023-03-22 – 2023-03-23 (×2): 5 mg via ORAL
  Filled 2023-03-21 (×2): qty 1

## 2023-03-21 MED ORDER — HYDROCORTISONE 1 % EX CREA: TOPICAL_CREAM | Freq: Three times a day (TID) | CUTANEOUS | Status: DC | PRN

## 2023-03-21 MED ORDER — METHOCARBAMOL 500 MG PO TABS
500.0000 mg | ORAL_TABLET | Freq: Four times a day (QID) | ORAL | Status: DC
  Administered 2023-03-21 – 2023-03-23 (×7): 500 mg via ORAL
  Filled 2023-03-21 (×7): qty 1

## 2023-03-21 MED ORDER — ACETAMINOPHEN 500 MG PO TABS
1000.0000 mg | ORAL_TABLET | Freq: Three times a day (TID) | ORAL | Status: DC
  Administered 2023-03-21 – 2023-03-22 (×5): 1000 mg via ORAL
  Filled 2023-03-21 (×5): qty 2

## 2023-03-21 NOTE — Progress Notes (Signed)
PROGRESS NOTE    [name] [name]  GNF:[id number] DOB: [birth year] DOA: 03/16/2023 PCP: [name], MD     Brief Narrative:  [name] [name] is a 89 y.o. female with medical history significant for COPD, chronic respiratory failure, heart block with pacemaker, pulmonary hypertension, chronic HFpEF, atrial fibrillation on Eliquis, and CKD 4 who presents with scalp hematoma and epistaxis after a syncopal episode with fall.   Patient was at cardiac rehab and was transferring from her walker to a chair when she had a sudden loss of consciousness and fell, hitting her face and head.  She quickly regained awareness and had epistaxis and a frontal scalp hematoma.  She denies any recent chest pain.  She has not felt a shock from her cardiac device.  She reports mild dyspnea without cough, denies fever or chills, and notes that her bilateral lower extremity swelling is about the same as usual.   Upon interrogation of her ICD, was found that patient had V-fib arrest earlier and was defibrillated out of it.  Patient was admitted and cardiology consulted.  New events last 24 hours / Subjective: Complains of neck pain.  Complains of diffuse itching on her back, hips, neck.  Apparently, patient has had intermittent itching without etiology going on about a year.  Has seen dermatology for it and was prescribed Kenalog at one point.  Assessment & Plan:   Principal Problem:   Syncope, cardiogenic Active Problems:   Coronary atherosclerosis of native coronary artery   CKD (chronic kidney disease) stage 4, GFR 15-29 ml/min (HCC)   Essential (primary) hypertension   Primary pulmonary hypertension (HCC)   Chronic atrial fibrillation (HCC)   Chronic obstructive pulmonary disease, unspecified (HCC)   Chronic respiratory failure with hypoxia (HCC)   Elevated troponin   Ventricular arrhythmia   Subdural hematoma (HCC)   Nasal fracture   Cardiogenic syncope V-fib/V-tach Hx A-fib -ICD interrogation found  patient had torsades de points, V-fib and received 1 shock -Tikosyn discontinued -Amiodarone -Cardiology following  Left subdural hematoma -CT head: Minimal increase in size of left tentorial leaflet subdural hematoma, now measuring up to 3 mm, previously 2 mm. No new sites of hemorrhage identified. -IV heparin currently on hold -Discussed with neurosurgery over the phone 11/27.  No surgical interventions required.  Consider repeat head imaging if patient has any mental decline.  Ideally, patient would need to be off anticoagulation for 2 weeks.  However, considering her ACS and A-fib, could consider starting short acting anticoagulation such as IV heparin or Lovenox in 3 days and monitor closely -Repeat CT head  Bilateral nasal fractures -Allow swelling to go down, she will need outpatient ENT evaluation  CAD NSTEMI -Troponin 34, 180, 243 -Lipitor  COPD with chronic hypoxic respiratory failure -Continue inhalers  Chronic diastolic CHF, pulmonary hypertension -BNP 747.5 -Torsemide, bisoprolol  CKD stage 3B- IV -Baseline creatinine 2.63 --> 1.26 -Stable  Neck pain -Cervical spine CT negative -Continue Robaxin, K-pad -Repeat CT cervical spine  Leukocytosis -Remains afebrile, unclear if this is reactive leukocytosis in setting of torsades and ICD firing, syncope and hematoma versus infectious process -Blood cultures negative to date -UA negative -Chest x-ray without focal consolidation -WBC improved without antibiotic, monitor   DVT prophylaxis:  SCDs Start: 03/16/23 2155  Code Status: Full code Family Communication: Son and daughter at bedside Disposition Plan: PT recommended ALF placement with home health, however, patient and family do not feel this is going to be enough care as they do not have 24-hour  support.  Asked PT to reevaluate for SNF placement Status is: Inpatient Remains inpatient appropriate because: Repeat imaging ordered  Antimicrobials:   Anti-infectives (From admission, onward)    None        Objective: Vitals:   03/21/23 0500 03/21/23 0635 03/21/23 0740 03/21/23 1139  BP:    (!) 100/45  Pulse:      Resp:    19  Temp:    98.6 F (37 C)  TempSrc:    Oral  SpO2:   96%   Weight: 71.4 kg 71.4 kg    Height:        Intake/Output Summary (Last 24 hours) at 03/21/2023 1350 Last data filed at 03/21/2023 0100 Gross per 24 hour  Intake --  Output 800 ml  Net -800 ml   Filed Weights   03/20/23 0350 03/21/23 0500 03/21/23 0635  Weight: 72.6 kg 71.4 kg 71.4 kg    Examination:  General exam: Appears calm and comfortable  Respiratory system: Clear to auscultation anteriorly, tachypneic without distress Cardiovascular system: S1 & S2 heard, RR Gastrointestinal system: Abdomen is nondistended, soft and nontender.  Central nervous system: Alert and oriented. No focal neurological deficits. Speech clear.  Extremities: Right lower extremity edema with significant bruising Skin: Facial bruising Psychiatry: Judgement and insight appear normal. Mood & affect appropriate.   Data Reviewed: I have personally reviewed following labs and imaging studies  CBC: Recent Labs  Lab 03/16/23 1400 03/17/23 0352 03/18/23 0345 03/19/23 0420 03/20/23 0320 03/21/23 0457  WBC 10.3 9.8 13.4* 17.0* 16.2* 14.7*  NEUTROABS 6.0  --   --   --   --   --   HGB 12.1 10.9* 11.4* 11.4* 11.0* 10.9*  HCT 37.3 33.1* 35.1* 36.2 35.1* 33.6*  MCV 100.0 98.5 98.9 100.3* 99.7 99.1  PLT 186 160 170 188 200 212   Basic Metabolic Panel: Recent Labs  Lab 03/17/23 0352 03/18/23 0345 03/19/23 0420 03/20/23 0320 03/21/23 0457  NA 141 141 141 140 135  K 3.5 3.6 4.2 3.8 4.0  CL 100 102 103 102 100  CO2 27 26 26 29 26   GLUCOSE 104* 121* 112* 107* 106*  BUN 40* 31* 29* 33* 42*  CREATININE 1.65* 1.39* 1.26* 1.31* 1.57*  CALCIUM 9.2 9.0 8.6* 8.5* 8.6*  MG 2.5*  --   --   --   --    GFR: Estimated Creatinine Clearance: 21.4 mL/min (A) (by C-G  formula based on SCr of 1.57 mg/dL (H)). Liver Function Tests: Recent Labs  Lab 03/19/23 0420  AST 19  ALT 12  ALKPHOS 57  BILITOT 1.7*  PROT 7.3  ALBUMIN 3.5   No results for input(s): "LIPASE", "AMYLASE" in the last 168 hours. No results for input(s): "AMMONIA" in the last 168 hours. Coagulation Profile: No results for input(s): "INR", "PROTIME" in the last 168 hours. Cardiac Enzymes: Recent Labs  Lab 03/16/23 1400  CKTOTAL 44   BNP (last 3 results) No results for input(s): "PROBNP" in the last 8760 hours. HbA1C: No results for input(s): "HGBA1C" in the last 72 hours. CBG: No results for input(s): "GLUCAP" in the last 168 hours. Lipid Profile: No results for input(s): "CHOL", "HDL", "LDLCALC", "TRIG", "CHOLHDL", "LDLDIRECT" in the last 72 hours. Thyroid Function Tests: Recent Labs    03/19/23 0420  TSH 2.759   Anemia Panel: No results for input(s): "VITAMINB12", "FOLATE", "FERRITIN", "TIBC", "IRON", "RETICCTPCT" in the last 72 hours. Sepsis Labs: No results for input(s): "PROCALCITON", "LATICACIDVEN" in the last 168 hours.  Recent Results (from the past 240 hour(s))  Culture, blood (Routine X 2) w Reflex to ID Panel     Status: None (Preliminary result)   Collection Time: 03/19/23  8:34 AM   Specimen: BLOOD  Result Value Ref Range Status   Specimen Description BLOOD BLOOD LEFT HAND  Final   Special Requests   Final    BOTTLES DRAWN AEROBIC AND ANAEROBIC Blood Culture results may not be optimal due to an inadequate volume of blood received in culture bottles   Culture   Final    NO GROWTH 2 DAYS Performed at [facility], [address], [place], [place] [address]    Report Status PENDING  Incomplete  Culture, blood (Routine X 2) w Reflex to ID Panel     Status: None (Preliminary result)   Collection Time: 03/19/23  8:47 AM   Specimen: BLOOD  Result Value Ref Range Status   Specimen Description BLOOD BLOOD RIGHT HAND  Final   Special Requests   Final     BOTTLES DRAWN AEROBIC AND ANAEROBIC Blood Culture results may not be optimal due to an inadequate volume of blood received in culture bottles   Culture   Final    NO GROWTH 2 DAYS Performed at [facility], [address] [place], [place] [address]    Report Status PENDING  Incomplete      Radiology Studies: ECHOCARDIOGRAM COMPLETE  Result Date: 03/20/2023    ECHOCARDIOGRAM REPORT   Patient Name:   [name] [name] Date of Exam: 03/20/2023 Medical Rec #:  [id number]    Height:       60.0 in Accession #:    [id number]   Weight:       160.1 lb Date of Birth:  10-02-1931   BSA:          1.698 m Patient Age:    89 years     BP:           144/61 mmHg Patient Gender: F            HR:           75 bpm. Exam Location:  Inpatient Procedure: 2D Echo, Cardiac Doppler and Color Doppler Indications:    cardiac arrest  History:        Patient has prior history of Echocardiogram examinations, most                 recent 07/08/2022. Defibrillator, chronic kidney disease; Risk                 Factors:Hypertension and Dyslipidemia.  Sonographer:    Delcie Roch RDCS Referring Phys: [phone] Salvatore Decent KLEIN IMPRESSIONS  1. Left ventricular ejection fraction, by estimation, is 55 to 60%. The left ventricle has normal function. Left ventricular endocardial border not optimally defined to evaluate regional wall motion. Left ventricular diastolic parameters are indeterminate. There is the interventricular septum is flattened in systole and diastole, consistent with right ventricular pressure and volume overload.  2. Right ventricular systolic function mild to moderately reduced. The right ventricular size is moderately enlarged. There is moderately elevated pulmonary artery systolic pressure.  3. Left atrial size was mildly dilated.  4. Right atrial size was mildly dilated.  5. The mitral valve was not well visualized. No evidence of mitral valve regurgitation. No evidence of mitral stenosis.  6. The tricuspid valve is  abnormal.  7. The aortic valve is tricuspid. There is mild calcification of the  aortic valve. There is mild thickening of the aortic valve. Aortic valve regurgitation is not visualized. No aortic stenosis is present.  8. The inferior vena cava is dilated in size with <50% respiratory variability, suggesting right atrial pressure of 15 mmHg. FINDINGS  Left Ventricle: Left ventricular ejection fraction, by estimation, is 55 to 60%. The left ventricle has normal function. Left ventricular endocardial border not optimally defined to evaluate regional wall motion. Definity contrast agent was given IV to delineate the left ventricular endocardial borders. The left ventricular internal cavity size was normal in size. There is no left ventricular hypertrophy. The interventricular septum is flattened in systole and diastole, consistent with right ventricular pressure and volume overload. Left ventricular diastolic parameters are indeterminate. Right Ventricle: The right ventricular size is moderately enlarged. Right vetricular wall thickness was not well visualized. Right ventricular systolic function mild to moderately reduced. There is moderately elevated pulmonary artery systolic pressure. The tricuspid regurgitant velocity is 3.24 m/s, and with an assumed right atrial pressure of 15 mmHg, the estimated right ventricular systolic pressure is 57.0 mmHg. Left Atrium: Left atrial size was mildly dilated. Right Atrium: Right atrial size was mildly dilated. Pericardium: There is no evidence of pericardial effusion. Mitral Valve: The mitral valve was not well visualized. No evidence of mitral valve regurgitation. No evidence of mitral valve stenosis. Tricuspid Valve: The tricuspid valve is abnormal. Tricuspid valve regurgitation is mild . No evidence of tricuspid stenosis. Aortic Valve: The aortic valve is tricuspid. There is mild calcification of the aortic valve. There is mild thickening of the aortic valve. There is mild to  moderate aortic valve annular calcification. Aortic valve regurgitation is not visualized. No aortic stenosis is present. Pulmonic Valve: The pulmonic valve was not well visualized. Pulmonic valve regurgitation is mild. No evidence of pulmonic stenosis. Aorta: The aortic root and ascending aorta are structurally normal, with no evidence of dilitation. Venous: The inferior vena cava is dilated in size with less than 50% respiratory variability, suggesting right atrial pressure of 15 mmHg. IAS/Shunts: No atrial level shunt detected by color flow Doppler. Additional Comments: A device lead is visualized in the right atrium and right ventricle.  LEFT VENTRICLE PLAX 2D LVIDd:         4.20 cm     Diastology LVIDs:         2.40 cm     LV e' medial: 8.70 cm/s LV PW:         1.00 cm LV IVS:        1.00 cm LVOT diam:     1.80 cm LV SV:         38 LV SV Index:   23 LVOT Area:     2.54 cm  LV Volumes (MOD) LV vol d, MOD A4C: 51.0 ml LV vol s, MOD A4C: 24.5 ml LV SV MOD A4C:     51.0 ml RIGHT VENTRICLE            IVC RV Basal diam:  3.10 cm    IVC diam: 2.40 cm RV S prime:     8.27 cm/s TAPSE (M-mode): 1.6 cm LEFT ATRIUM             Index        RIGHT ATRIUM           Index LA diam:        3.90 cm 2.30 cm/m   RA Area:     23.10 cm LA Vol (A2C):  71.8 ml 42.29 ml/m  RA Volume:   69.80 ml  41.11 ml/m LA Vol (A4C):   50.1 ml 29.51 ml/m LA Biplane Vol: 60.8 ml 35.81 ml/m  AORTIC VALVE             PULMONIC VALVE LVOT Vmax:   79.20 cm/s  PR End Diast Vel: 4.16 msec LVOT Vmean:  51.400 cm/s LVOT VTI:    0.151 m  AORTA Ao Root diam: 2.50 cm Ao Asc diam:  3.00 cm TRICUSPID VALVE TR Peak grad:   42.0 mmHg TR Vmax:        324.00 cm/s  SHUNTS Systemic VTI:  0.15 m Systemic Diam: 1.80 cm Dina Rich MD Electronically signed by Dina Rich MD Signature Date/Time: 03/20/2023/4:30:40 PM    Final    VAS [place] LOWER EXTREMITY VENOUS (DVT)  Result Date: 03/20/2023  Lower Venous DVT Study Patient Name:  [name] [name]  Date of Exam:    03/20/2023 Medical Rec #: [id number]     Accession #:    [id number] Date of Birth: 19-Feb-1932    Patient Gender: F Patient Age:   67 years Exam Location:  [facility] Procedure:      VAS [place] LOWER EXTREMITY VENOUS (DVT) Referring Phys: Sherryl Manges --------------------------------------------------------------------------------  Indications: Swelling, and Pain. Other Indications: Recent fall with LOC. Anticoagulation: Eliquis (prior to admission). Comparison Study: Previous exam on 06/15/22 was negative for DVT. Performing Technologist: Ernestene Mention RVT, RDMS  Examination Guidelines: A complete evaluation includes B-mode imaging, spectral Doppler, color Doppler, and power Doppler as needed of all accessible portions of each vessel. Bilateral testing is considered an integral part of a complete examination. Limited examinations for reoccurring indications may be performed as noted. The reflux portion of the exam is performed with the patient in reverse Trendelenburg.  +---------+---------------+---------+-----------+----------+-------------------+ RIGHT    CompressibilityPhasicitySpontaneityPropertiesThrombus Aging      +---------+---------------+---------+-----------+----------+-------------------+ CFV      Full           No       Yes                                      +---------+---------------+---------+-----------+----------+-------------------+ SFJ      Full                                                             +---------+---------------+---------+-----------+----------+-------------------+ FV Prox  Full           No       Yes                                      +---------+---------------+---------+-----------+----------+-------------------+ FV Mid   Full           No       Yes                                      +---------+---------------+---------+-----------+----------+-------------------+ FV DistalFull           No       Yes                                       +---------+---------------+---------+-----------+----------+-------------------+  PFV      Full                                                             +---------+---------------+---------+-----------+----------+-------------------+ POP      Full           No       Yes                                      +---------+---------------+---------+-----------+----------+-------------------+ PTV      Full                                         Not well visualized +---------+---------------+---------+-----------+----------+-------------------+ PERO     Full                                         Not well visualized +---------+---------------+---------+-----------+----------+-------------------+ pulsatile waveforms  +----+---------------+---------+-----------+----------+--------------+ LEFTCompressibilityPhasicitySpontaneityPropertiesThrombus Aging +----+---------------+---------+-----------+----------+--------------+ CFV Full           No       Yes                                 +----+---------------+---------+-----------+----------+--------------+ pulsatile waveforms    Summary: RIGHT: - There is no evidence of deep vein thrombosis in the lower extremity.  - No cystic structure found in the popliteal fossa.  LEFT: - No evidence of common femoral vein obstruction.   *See table(s) above for measurements and observations. Electronically signed by Sherald Hess MD on 03/20/2023 at 3:33:13 PM.    Final       Scheduled Meds:  acetaminophen  1,000 mg Oral Q8H   amiodarone  400 mg Oral BID   atorvastatin  20 mg Oral QHS   [START ON 03/22/2023] bisoprolol  5 mg Oral Daily   fluticasone furoate-vilanterol  1 puff Inhalation Daily   And   umeclidinium bromide  1 puff Inhalation Daily   lidocaine  1 patch Transdermal Q24H   sodium chloride flush  3 mL Intravenous Q12H   torsemide  100 mg Oral Daily   Continuous Infusions:   LOS: 5 days   Time spent: 35  minutes   Noralee Stain, DO [facility] 03/21/2023, 1:50 PM   Available via Epic secure chat 7am-7pm After these hours, please refer to coverage provider listed on [email or web address]

## 2023-03-21 NOTE — Progress Notes (Signed)
Physical Therapy Treatment Patient Details Name: [name] [name] MRN: [id number] DOB: Nov 06, 1931 Today's Date: 03/21/2023   History of Present Illness [name] [name] is a 89 y.o. female who presents with scalp hematoma and epistaxis after a syncopal episode with fall.  CT head: Minimal increase in size of left tentorial leaflet subdural hematoma and bilateral nasal fractures.  Past medical history significant for COPD, chronic respiratory failure, heart block with pacemaker, pulmonary hypertension, chronic HFpEF, atrial fibrillation on Eliquis, and CKD 4.    PT Comments  Pt continues to have limited mobility due to pain in neck and RLE and generalized weakness. Not progressing quickly enough to return to ALF at this time. Patient will benefit from continued inpatient follow up therapy, <3 hours/day     If plan is discharge home, recommend the following: A lot of help with walking and/or transfers;A lot of help with bathing/dressing/bathroom;Assistance with cooking/housework;Direct supervision/assist for medications management;Help with stairs or ramp for entrance;Assist for transportation   Can travel by private vehicle     No  Equipment Recommendations  None recommended by PT    Recommendations for Other Services       Precautions / Restrictions Precautions Precautions: Fall Precaution Comments: neck pain Restrictions Weight Bearing Restrictions: No     Mobility  Bed Mobility Overal bed mobility: Needs Assistance Bed Mobility: Supine to Sit, Sit to Supine     Supine to sit: Mod assist, HOB elevated, Used rails     General bed mobility comments: Assist to elevate trunk into sitting. Assist to lower trunk and bring legs back up into the bed    Transfers Overall transfer level: Needs assistance Equipment used: Rolling walker (2 wheels) Transfers: Sit to/from Stand Sit to Stand: Min assist           General transfer comment: Assist to bring hips up and stabilize.     Ambulation/Gait             Pre-gait activities: Side stepped up side of bed with min assist with walker and pt shuffling feet. Pt feeling anxious that RLE won't support her to take full step with lt     Stairs             Wheelchair Mobility     Tilt Bed    Modified Rankin (Stroke Patients Only)       Balance Overall balance assessment: Needs assistance Sitting-balance support: Bilateral upper extremity supported, Feet supported, Feet unsupported Sitting balance-Leahy Scale: Fair     Standing balance support: Bilateral upper extremity supported, During functional activity Standing balance-Leahy Scale: Poor Standing balance comment: walker and min assist for static standing                            Cognition Arousal: Alert Behavior During Therapy: Anxious, WFL for tasks assessed/performed Overall Cognitive Status: Within Functional Limits for tasks assessed                                          Exercises      General Comments        Pertinent Vitals/Pain Pain Assessment Pain Assessment: Faces Faces Pain Scale: Hurts little more Pain Location: neck; Lt side Pain Descriptors / Indicators: Aching, Discomfort Pain Intervention(s): Limited activity within patient's tolerance, Monitored during session, Repositioned    Home Living  Prior Function            PT Goals (current goals can now be found in the care plan section) Acute Rehab PT Goals Patient Stated Goal: neck to stop hurting, regain mobility Progress towards PT goals: Progressing toward goals    Frequency    Min 1X/week      PT Plan      Co-evaluation              AM-PAC PT "6 Clicks" Mobility   Outcome Measure  Help needed turning from your back to your side while in a flat bed without using bedrails?: A Lot Help needed moving from lying on your back to sitting on the side of a flat bed without  using bedrails?: A Lot Help needed moving to and from a bed to a chair (including a wheelchair)?: A Little Help needed standing up from a chair using your arms (e.g., wheelchair or bedside chair)?: A Little Help needed to walk in hospital room?: Total Help needed climbing 3-5 steps with a railing? : Total 6 Click Score: 12    End of Session Equipment Utilized During Treatment: Gait belt Activity Tolerance: Patient tolerated treatment well Patient left: with call bell/phone within reach;in bed;with bed alarm set   PT Visit Diagnosis: Other abnormalities of gait and mobility (R26.89);Muscle weakness (generalized) (M62.81);Difficulty in walking, not elsewhere classified (R26.2);Pain;History of falling (Z91.81) Pain - part of body:  (neck)     Time: 9518-8416 PT Time Calculation (min) (ACUTE ONLY): 18 min  Charges:    $Therapeutic Activity: 8-22 mins PT General Charges $$ ACUTE PT VISIT: 1 Visit                     [facility] PT Acute Rehabilitation Services Office [phone]    Angelina Ok [facility] 03/21/2023, 4:08 PM

## 2023-03-21 NOTE — Progress Notes (Addendum)
  Patient Name: [name] [name] Date of Encounter: 03/21/2023  Primary Cardiologist: Nona Dell, MD Electrophysiologist: Lewayne Bunting, MD    Hospital Course    [name] IVYONNA VISCUSO is a 89 y.o. female with PMH chronic atrial fibrillation on Tikosyn, CHB, CAD s/p CABG (1998, LIMA-LAD, SVG-Diag, SVG-RI-OM + DES to PLA in 2006) , VT/VF s/p ICD, 3L O2 dependent severe COPD, CKD IV & anxiety who was admitted on 11/27 after an ICD shock at [facility].  Device interrogation showed appropriate shock 35j for PMVT/VF. Tikosyn was stopped on admission.  Now on amiodarone Review of the strips (SK) not consistent with torsade de pointes>> Subdural hematoma with mild interval worsening 11/27 Echo EF 55-60 Asymmetric LE swelling Doppler 11/30 Neg  Interval Summary   No chest pain or shortness of breath.  Set up yesterday.  Neck still hurts.  Vital Signs    Vitals:   03/21/23 0416 03/21/23 0500 03/21/23 0635 03/21/23 0740  BP:      Pulse: 74     Resp: 18     Temp:      TempSrc:      SpO2: 98%   96%  Weight:  71.4 kg 71.4 kg   Height:        Intake/Output Summary (Last 24 hours) at 03/21/2023 1034 Last data filed at 03/21/2023 0100 Gross per 24 hour  Intake --  Output 800 ml  Net -800 ml   Filed Weights   03/20/23 0350 03/21/23 0500 03/21/23 0635  Weight: 72.6 kg 71.4 kg 71.4 kg    Physical Exam    .Well developed and nourished in no acute distress HENT with facial ecchymoses on the right Neck supple with Clear Regular rate and rhythm, no murmurs or gallops Abd-soft with active BS No Clubbing cyanosis asymmetric edema  swollen R leg Skin-warm and dry A & Oriented  Grossly normal sensory and motor function      Assessment & Plan    Polymorphic VT/VF s/p ICD Shock Ischemic heart disease with prior bypass NSVT, PVC's   Atrial fibrillation-persistent  Subdural-traumatic Nasal fractures Renal injury chronic with acute variability  R lower extremity swelling    Given  the mechanism of the polymorphic VT, i.e. not short long and not late coupled, it is not consistent with torsades.  The more likely mechanism is ischemia.  Amiodarone was originally developed as an anti-ischemic agent; however, augmented anti-ischemic therapy would be appropriate. Changed losartan to bisoprolol>> today will increase to 5 and if possible add nitrate in am   Will need repeat imaging prob toknow when to resume anticoagulation as interval study had showed worsening   Right lower extremity swelling is notable.  Concerned about a DVT.  But venous Doppler was negative [name] [name]  03/21/2023, 10:34 AM

## 2023-03-22 DIAGNOSIS — R55 Syncope and collapse: Secondary | ICD-10-CM | POA: Diagnosis not present

## 2023-03-22 LAB — CBC
HCT: 32.4 % — ABNORMAL LOW (ref 36.0–46.0)
Hemoglobin: 10.3 g/dL — ABNORMAL LOW (ref 12.0–15.0)
MCH: 31.2 pg (ref 26.0–34.0)
MCHC: 31.8 g/dL (ref 30.0–36.0)
MCV: 98.2 fL (ref 80.0–100.0)
Platelets: 218 10*3/uL (ref 150–400)
RBC: 3.3 MIL/uL — ABNORMAL LOW (ref 3.87–5.11)
RDW: 14.7 % (ref 11.5–15.5)
WBC: 11 10*3/uL — ABNORMAL HIGH (ref 4.0–10.5)
nRBC: 0 % (ref 0.0–0.2)

## 2023-03-22 LAB — BASIC METABOLIC PANEL
Anion gap: 10 (ref 5–15)
BUN: 53 mg/dL — ABNORMAL HIGH (ref 8–23)
CO2: 27 mmol/L (ref 22–32)
Calcium: 9 mg/dL (ref 8.9–10.3)
Chloride: 102 mmol/L (ref 98–111)
Creatinine, Ser: 1.71 mg/dL — ABNORMAL HIGH (ref 0.44–1.00)
GFR, Estimated: 28 mL/min — ABNORMAL LOW (ref >60)
Glucose, Bld: 112 mg/dL — ABNORMAL HIGH (ref 70–99)
Potassium: 4.2 mmol/L (ref 3.5–5.1)
Sodium: 139 mmol/L (ref 135–145)

## 2023-03-22 LAB — MAGNESIUM: Magnesium: 2.5 mg/dL — ABNORMAL HIGH (ref 1.7–2.4)

## 2023-03-22 MED ORDER — AMOXICILLIN-POT CLAVULANATE 875-125 MG PO TABS: 1.0000 | ORAL_TABLET | Freq: Two times a day (BID) | ORAL | Status: DC

## 2023-03-22 MED ORDER — AMOXICILLIN-POT CLAVULANATE 500-125 MG PO TABS
1.0000 | ORAL_TABLET | Freq: Two times a day (BID) | ORAL | Status: DC
  Administered 2023-03-22 – 2023-03-23 (×3): 1 via ORAL
  Filled 2023-03-22 (×4): qty 1

## 2023-03-22 NOTE — Plan of Care (Signed)

## 2023-03-22 NOTE — Progress Notes (Signed)
PHARMACY NOTE:  ANTIMICROBIAL RENAL DOSAGE ADJUSTMENT  Current antimicrobial regimen includes a mismatch between antimicrobial dosage and estimated renal function.  As per policy approved by the Pharmacy & Therapeutics and Medical Executive Committees, the antimicrobial dosage will be adjusted accordingly.  Current antimicrobial dosage:  Augmentin 875/125 x 5 days  Indication: cellulitis  Renal Function:  Estimated Creatinine Clearance: 19.7 mL/min (A) (by C-G formula based on SCr of 1.71 mg/dL (H)).    Antimicrobial dosage has been changed to:  Augmentin 500/125 x 5 days   Thank you for allowing pharmacy to be a part of this patient's care.  Christoper Fabian, PharmD, BCPS Please see amion for complete clinical pharmacist phone list 03/22/2023 1:16 PM

## 2023-03-22 NOTE — TOC Progression Note (Addendum)
Transition of Care [facility]) - Progression Note    Patient Details  Name: [name] [name] MRN: [id number] Date of Birth: [birth year]  [facility] CM/SW Contact  Delilah Shan, LCSWA Phone Number: 03/22/2023, 5:03 PM  Clinical Narrative:     CSW spoke with [name] who confirmed that he would like for patient to go SNF when patient medically ready for dc. Patients son informed CSW that number one choice is [facility]. [name] gave CSW permission to fax patients initial referral for SNF placement. CSW spoke with Lynnea Ferrier with [facility] who is going to review referral and give CSW call back to confirm on if they can offer SNF bed for patient. CSW will continue to follow.  [facility] accepting patient for SNF. CSW started patients insurance authorization Auth ID# [id number]. Insurance authorization has been approved from 12/3-12/5.         Expected Discharge Plan and Services                                               Social Determinants of Health (SDOH) Interventions SDOH Screenings   Food Insecurity: No Food Insecurity (03/16/2023)  Housing: Low Risk  (03/16/2023)  Transportation Needs: No Transportation Needs (03/16/2023)  Utilities: Not At Risk (03/16/2023)  Depression (PHQ2-9): High Risk (12/17/2022)  Tobacco Use: Low Risk  (03/16/2023)    Readmission Risk Interventions     No data to display

## 2023-03-22 NOTE — Progress Notes (Addendum)
  Patient Name: [name] [name] Date of Encounter: 03/22/2023  Primary Cardiologist: Nona Dell, MD Electrophysiologist: Lewayne Bunting, MD  Interval Summary   Neck remains painful Appetite has improved.  At this time, the patient denies chest pain, shortness of breath, or any new concerns.  Vital Signs    Vitals:   03/21/23 2137 03/22/23 0541 03/22/23 0600 03/22/23 0712  BP: 106/60 (!) 126/50    Pulse: 74 74    Resp: 20 (!) 25    Temp: 98.3 F (36.8 C) 98.6 F (37 C)    TempSrc: Oral Oral  Oral  SpO2: 98% 97%    Weight:   71.5 kg   Height:        Intake/Output Summary (Last 24 hours) at 03/22/2023 0805 Last data filed at 03/22/2023 0500 Gross per 24 hour  Intake --  Output 850 ml  Net -850 ml   Filed Weights   03/21/23 0500 03/21/23 0635 03/22/23 0600  Weight: 71.4 kg 71.4 kg 71.5 kg    Physical Exam    GEN- The patient is well appearing, alert and oriented x 3 today.   HEENT - ecchymosis to R side of face Lungs- Clear to ausculation bilaterally, normal work of breathing Cardiac- Regular rate and rhythm, no murmurs, rubs or gallops GI- soft, NT, ND, + BS Extremities- no clubbing or cyanosis. Ecchymosis to R shin with surrounding edema  Telemetry    VP at 70 (personally reviewed)  Hospital Course    [name] [name] is a 89 y.o. female with PMH of afib on tikosyn, CHB, CAD s/p CABG (1998), VT/VF s/p ICD, COPD on home Os, CKD IV, anxiety who was admitted for ICD shock while at pulmonary rehab.  Device interrogation showed appropriate HV therapy for PMVT/VF. Tikosyn was stopped on admission and she is now on amiodarone.  She suffered a subdural hematoma with mild interval worsening on 11/27 imaging.   Assessment & Plan    #) PMVT/VF s/p ICD shock  #) ICM  #) NSVT, PVCs  #) persis afib  #) CKD  #) SDH, nasal fractures  Maintaining sinus rhythm, no further ectopy Continue amiodarone 400mg  BID Holding OAC iso SDH and facial fractures Continue  bisoprolol 5mg  daily Consider imdur        For questions or updates, please contact [facility] Please consult [email or web address] for contact info under Cardiology/STEMI.  Signed, Sherie Don, NP  03/22/2023, 8:05 AM   EP Attending  Patient seen and examined. Agree with above. The patient is well known to me and has a long h/o PMVT, not Torsade. She is quiet on amiodarone. Continue. I think we can discharge tomorrow. Unable to give systemic or DVT anti-coag due to SDH.   Sharlot Gowda Aalani Aikens,MD

## 2023-03-22 NOTE — Progress Notes (Signed)
PROGRESS NOTE    [name] [name]  ZOX:[id number] DOB: 07-07-1931 DOA: 03/16/2023 PCP: [name], MD     Brief Narrative:  [name] [name] is a 89 y.o. female with medical history significant for COPD, chronic respiratory failure, heart block with pacemaker, pulmonary hypertension, chronic HFpEF, atrial fibrillation on Eliquis, and CKD 4 who presents with scalp hematoma and epistaxis after a syncopal episode with fall.   Patient was at cardiac rehab and was transferring from her walker to a chair when she had a sudden loss of consciousness and fell, hitting her face and head.  She quickly regained awareness and had epistaxis and a frontal scalp hematoma.  She denies any recent chest pain.  She has not felt a shock from her cardiac device.  She reports mild dyspnea without cough, denies fever or chills, and notes that her bilateral lower extremity swelling is about the same as usual.   Upon interrogation of her ICD, was found that patient had V-fib arrest earlier and was defibrillated out of it.  Patient was admitted and cardiology consulted.  New events last 24 hours / Subjective: Neck pain improving a little bit.  States that she does not feel as good today as she did yesterday   Assessment & Plan:   Principal Problem:   Syncope, cardiogenic Active Problems:   Coronary atherosclerosis of native coronary artery   CKD (chronic kidney disease) stage 4, GFR 15-29 ml/min (HCC)   Essential (primary) hypertension   Primary pulmonary hypertension (HCC)   Chronic atrial fibrillation (HCC)   Chronic obstructive pulmonary disease, unspecified (HCC)   Chronic respiratory failure with hypoxia (HCC)   Elevated troponin   Ventricular arrhythmia   Subdural hematoma (HCC)   Nasal fracture   Cardiogenic syncope V-fib/V-tach Hx A-fib -ICD interrogation found patient had torsades de points, V-fib and received 1 shock -Tikosyn discontinued -Amiodarone -Cardiology following  Left subdural  hematoma -CT head: Minimal increase in size of left tentorial leaflet subdural hematoma, now measuring up to 3 mm, previously 2 mm. No new sites of hemorrhage identified. -IV heparin currently on hold -Discussed with neurosurgery over the phone 11/27.  No surgical interventions required.  Consider repeat head imaging if patient has any mental decline.  Ideally, patient would need to be off anticoagulation for 2 weeks.  However, considering her ACS and A-fib, could consider starting short acting anticoagulation such as IV heparin or Lovenox in 3 days and monitor closely -Repeat CT head showing mild interval increase in size of small subdural hematoma extending along the posterior falx and left tentorium, still measuring up to 3 mm in maximal thickness. No significant mass effect or midline shift.  Bilateral nasal fractures -Allow swelling to go down, she will need outpatient ENT evaluation  CAD NSTEMI -Troponin 34, 180, 243 -Lipitor  COPD with chronic hypoxic respiratory failure -Continue inhalers  Chronic diastolic CHF, pulmonary hypertension -BNP 747.5 -Torsemide, bisoprolol  CKD stage 3B- IV -Baseline creatinine 2.63 --> 1.26 -Stable  Neck pain -Cervical spine CT negative -Continue Robaxin, K-pad -Repeat CT cervical spine: No acute fracture or traumatic subluxation of the cervical spine.   Leukocytosis -Remains afebrile, unclear if this is reactive leukocytosis in setting of torsades and ICD firing, syncope and hematoma versus infectious process -Blood cultures negative to date -UA negative -Chest x-ray without focal consolidation -WBC improved without antibiotic, monitor -?Right leg cellulitis --> Augmentin x 5 days   DVT prophylaxis:  SCDs Start: 03/16/23 2155  Code Status: Full code Family Communication: No  family at bedside Disposition Plan: PT recommended ALF placement with home health, however, patient and family do not feel this is going to be enough care as they  do not have 24-hour support.  Asked PT to reevaluate for SNF placement Status is: Inpatient Remains inpatient appropriate because: SNF placement   Antimicrobials:  Anti-infectives (From admission, onward)    None        Objective: Vitals:   03/22/23 0541 03/22/23 0600 03/22/23 0712 03/22/23 0713  BP: (!) 126/50   118/61  Pulse: 74   74  Resp: (!) 25   20  Temp: 98.6 F (37 C)   98.2 F (36.8 C)  TempSrc: Oral  Oral Oral  SpO2: 97%   98%  Weight:  71.5 kg    Height:        Intake/Output Summary (Last 24 hours) at 03/22/2023 1300 Last data filed at 03/22/2023 0500 Gross per 24 hour  Intake --  Output 850 ml  Net -850 ml   Filed Weights   03/21/23 0500 03/21/23 0635 03/22/23 0600  Weight: 71.4 kg 71.4 kg 71.5 kg    Examination:  General exam: Appears calm and comfortable  Respiratory system: Clear to auscultation anteriorly, tachypneic without distress Cardiovascular system: S1 & S2 heard, RR Gastrointestinal system: Abdomen is nondistended, soft and nontender.  Central nervous system: Alert and oriented. No focal neurological deficits. Speech clear.  Extremities: Right lower extremity edema with significant bruising, erythema Skin: Facial bruising Psychiatry: Judgement and insight appear normal. Mood & affect appropriate.   Data Reviewed: I have personally reviewed following labs and imaging studies  CBC: Recent Labs  Lab 03/16/23 1400 03/17/23 0352 03/18/23 0345 03/19/23 0420 03/20/23 0320 03/21/23 0457 03/22/23 0643  WBC 10.3   < > 13.4* 17.0* 16.2* 14.7* 11.0*  NEUTROABS 6.0  --   --   --   --   --   --   HGB 12.1   < > 11.4* 11.4* 11.0* 10.9* 10.3*  HCT 37.3   < > 35.1* 36.2 35.1* 33.6* 32.4*  MCV 100.0   < > 98.9 100.3* 99.7 99.1 98.2  PLT 186   < > 170 188 200 212 218   < > = values in this interval not displayed.   Basic Metabolic Panel: Recent Labs  Lab 03/17/23 0352 03/18/23 0345 03/19/23 0420 03/20/23 0320 03/21/23 0457 03/22/23 0643   NA 141 141 141 140 135 139  K 3.5 3.6 4.2 3.8 4.0 4.2  CL 100 102 103 102 100 102  CO2 27 26 26 29 26 27   GLUCOSE 104* 121* 112* 107* 106* 112*  BUN 40* 31* 29* 33* 42* 53*  CREATININE 1.65* 1.39* 1.26* 1.31* 1.57* 1.71*  CALCIUM 9.2 9.0 8.6* 8.5* 8.6* 9.0  MG 2.5*  --   --   --   --  2.5*   GFR: Estimated Creatinine Clearance: 19.7 mL/min (A) (by C-G formula based on SCr of 1.71 mg/dL (H)). Liver Function Tests: Recent Labs  Lab 03/19/23 0420  AST 19  ALT 12  ALKPHOS 57  BILITOT 1.7*  PROT 7.3  ALBUMIN 3.5   No results for input(s): "LIPASE", "AMYLASE" in the last 168 hours. No results for input(s): "AMMONIA" in the last 168 hours. Coagulation Profile: No results for input(s): "INR", "PROTIME" in the last 168 hours. Cardiac Enzymes: Recent Labs  Lab 03/16/23 1400  CKTOTAL 44   BNP (last 3 results) No results for input(s): "PROBNP" in the last 8760 hours. HbA1C:  No results for input(s): "HGBA1C" in the last 72 hours. CBG: No results for input(s): "GLUCAP" in the last 168 hours. Lipid Profile: No results for input(s): "CHOL", "HDL", "LDLCALC", "TRIG", "CHOLHDL", "LDLDIRECT" in the last 72 hours. Thyroid Function Tests: No results for input(s): "TSH", "T4TOTAL", "FREET4", "T3FREE", "THYROIDAB" in the last 72 hours.  Anemia Panel: No results for input(s): "VITAMINB12", "FOLATE", "FERRITIN", "TIBC", "IRON", "RETICCTPCT" in the last 72 hours. Sepsis Labs: No results for input(s): "PROCALCITON", "LATICACIDVEN" in the last 168 hours.  Recent Results (from the past 240 hour(s))  Culture, blood (Routine X 2) w Reflex to ID Panel     Status: None (Preliminary result)   Collection Time: 03/19/23  8:34 AM   Specimen: BLOOD  Result Value Ref Range Status   Specimen Description BLOOD BLOOD LEFT HAND  Final   Special Requests   Final    BOTTLES DRAWN AEROBIC AND ANAEROBIC Blood Culture results may not be optimal due to an inadequate volume of blood received in culture  bottles   Culture   Final    NO GROWTH 3 DAYS Performed at [facility], [address] [place], [place] [address]    Report Status PENDING  Incomplete  Culture, blood (Routine X 2) w Reflex to ID Panel     Status: None (Preliminary result)   Collection Time: 03/19/23  8:47 AM   Specimen: BLOOD  Result Value Ref Range Status   Specimen Description BLOOD BLOOD RIGHT HAND  Final   Special Requests   Final    BOTTLES DRAWN AEROBIC AND ANAEROBIC Blood Culture results may not be optimal due to an inadequate volume of blood received in culture bottles   Culture   Final    NO GROWTH 3 DAYS Performed at [facility], [address] [place], [place] [address]    Report Status PENDING  Incomplete      Radiology Studies: CT CERVICAL SPINE WO CONTRAST  Result Date: 03/21/2023 CLINICAL DATA:  Neck pain EXAM: CT CERVICAL SPINE WITHOUT CONTRAST TECHNIQUE: Multidetector CT imaging of the cervical spine was performed without intravenous contrast. Multiplanar CT image reconstructions were also generated. RADIATION DOSE REDUCTION: This exam was performed according to the departmental dose-optimization program which includes automated exposure control, adjustment of the mA and/or kV according to patient size and/or use of iterative reconstruction technique. COMPARISON:  Cervical spine CT 03/16/2023 FINDINGS: Alignment: Normal. Skull base and vertebrae: The bones are osteopenic. No acute fracture or focal osseous lesion identified. Soft tissues and spinal canal: No prevertebral fluid or swelling. No visible canal hematoma. Disc levels: There is disc space narrowing throughout the cervical spine most significant at C3-C4 and C4-C5 as well as T1-T2. Bilateral facet arthropathy is present causing mild bilateral neural foraminal stenosis at C3-C4 and C4-C5. There is no severe central canal stenosis at any level. Upper chest: Small pleural effusions are again seen. Other: There are atherosclerotic  calcifications of the bilateral carotid artery bifurcations. There is a small amount of hemorrhage along the left tentorium. Please see dedicated CT head for further description. IMPRESSION: 1. No acute fracture or traumatic subluxation of the cervical spine. 2. Multilevel degenerative changes of the cervical spine. 3. Small amount of hemorrhage along the left tentorium. Please see dedicated CT head for further description. Electronically Signed   By: Darliss Cheney M.D.   On: 03/21/2023 21:19   CT HEAD WO CONTRAST ( )  Result Date: 03/21/2023 CLINICAL DATA:  Follow-up examination for known subdural hematoma. EXAM:  CT HEAD WITHOUT CONTRAST TECHNIQUE: Contiguous axial images were obtained from the base of the skull through the vertex without intravenous contrast. RADIATION DOSE REDUCTION: This exam was performed according to the departmental dose-optimization program which includes automated exposure control, adjustment of the mA and/or kV according to patient size and/or use of iterative reconstruction technique. COMPARISON:  CT from 03/17/2023. FINDINGS: Brain: Previously identified small subdural hematoma extending along the posterior falx and left tentorium again seen, increased in size now measuring up to 3 mm in maximal thickness. No significant mass effect or midline shift. No other acute intracranial hemorrhage. No acute large vessel territory infarct. No mass lesion or mass effect. No hydrocephalus. Underlying atrophy with chronic small vessel ischemic disease noted. Vascular: No abnormal hyperdense vessel. Calcified atherosclerosis present at the skull base. Skull: Evolving right frontal scalp contusion. Calvarium remains intact. Sinuses/Orbits: Globes and orbital soft tissues demonstrate no acute finding. Ocular senescent calcifications noted. Mild mucosal thickening present about the sphenoid ethmoidal sinuses. Chronic left orbital floor fracture noted. Mastoid air cells are largely clear. Other:  None. IMPRESSION: 1. Mild interval increase in size of small subdural hematoma extending along the posterior falx and left tentorium, still measuring up to 3 mm in maximal thickness. No significant mass effect or midline shift. 2. No other acute intracranial abnormality. 3. Evolving right frontal scalp contusion. No calvarial fracture. Electronically Signed   By: Rise Mu M.D.   On: 03/21/2023 21:09   ECHOCARDIOGRAM COMPLETE  Result Date: 03/20/2023    ECHOCARDIOGRAM REPORT   Patient Name:   [name] [name] Coop Date of Exam: 03/20/2023 Medical Rec #:  [id number]    Height:       60.0 in Accession #:    [id number]   Weight:       160.1 lb Date of Birth:  1932-02-04   BSA:          1.698 m Patient Age:    89 years     BP:           144/61 mmHg Patient Gender: F            HR:           75 bpm. Exam Location:  Inpatient Procedure: 2D Echo, Cardiac Doppler and Color Doppler Indications:    cardiac arrest  History:        Patient has prior history of Echocardiogram examinations, most                 recent 07/08/2022. Defibrillator, chronic kidney disease; Risk                 Factors:Hypertension and Dyslipidemia.  Sonographer:    Delcie Roch RDCS Referring Phys: [phone] Salvatore Decent KLEIN IMPRESSIONS  1. Left ventricular ejection fraction, by estimation, is 55 to 60%. The left ventricle has normal function. Left ventricular endocardial border not optimally defined to evaluate regional wall motion. Left ventricular diastolic parameters are indeterminate. There is the interventricular septum is flattened in systole and diastole, consistent with right ventricular pressure and volume overload.  2. Right ventricular systolic function mild to moderately reduced. The right ventricular size is moderately enlarged. There is moderately elevated pulmonary artery systolic pressure.  3. Left atrial size was mildly dilated.  4. Right atrial size was mildly dilated.  5. The mitral valve was not well visualized. No evidence of  mitral valve regurgitation. No evidence of mitral stenosis.  6. The tricuspid valve is abnormal.  7. The aortic valve is  tricuspid. There is mild calcification of the aortic valve. There is mild thickening of the aortic valve. Aortic valve regurgitation is not visualized. No aortic stenosis is present.  8. The inferior vena cava is dilated in size with <50% respiratory variability, suggesting right atrial pressure of 15 mmHg. FINDINGS  Left Ventricle: Left ventricular ejection fraction, by estimation, is 55 to 60%. The left ventricle has normal function. Left ventricular endocardial border not optimally defined to evaluate regional wall motion. Definity contrast agent was given IV to delineate the left ventricular endocardial borders. The left ventricular internal cavity size was normal in size. There is no left ventricular hypertrophy. The interventricular septum is flattened in systole and diastole, consistent with right ventricular pressure and volume overload. Left ventricular diastolic parameters are indeterminate. Right Ventricle: The right ventricular size is moderately enlarged. Right vetricular wall thickness was not well visualized. Right ventricular systolic function mild to moderately reduced. There is moderately elevated pulmonary artery systolic pressure. The tricuspid regurgitant velocity is 3.24 m/s, and with an assumed right atrial pressure of 15 mmHg, the estimated right ventricular systolic pressure is 57.0 mmHg. Left Atrium: Left atrial size was mildly dilated. Right Atrium: Right atrial size was mildly dilated. Pericardium: There is no evidence of pericardial effusion. Mitral Valve: The mitral valve was not well visualized. No evidence of mitral valve regurgitation. No evidence of mitral valve stenosis. Tricuspid Valve: The tricuspid valve is abnormal. Tricuspid valve regurgitation is mild . No evidence of tricuspid stenosis. Aortic Valve: The aortic valve is tricuspid. There is mild  calcification of the aortic valve. There is mild thickening of the aortic valve. There is mild to moderate aortic valve annular calcification. Aortic valve regurgitation is not visualized. No aortic stenosis is present. Pulmonic Valve: The pulmonic valve was not well visualized. Pulmonic valve regurgitation is mild. No evidence of pulmonic stenosis. Aorta: The aortic root and ascending aorta are structurally normal, with no evidence of dilitation. Venous: The inferior vena cava is dilated in size with less than 50% respiratory variability, suggesting right atrial pressure of 15 mmHg. IAS/Shunts: No atrial level shunt detected by color flow Doppler. Additional Comments: A device lead is visualized in the right atrium and right ventricle.  LEFT VENTRICLE PLAX 2D LVIDd:         4.20 cm     Diastology LVIDs:         2.40 cm     LV e' medial: 8.70 cm/s LV PW:         1.00 cm LV IVS:        1.00 cm LVOT diam:     1.80 cm LV SV:         38 LV SV Index:   23 LVOT Area:     2.54 cm  LV Volumes (MOD) LV vol d, MOD A4C: 51.0 ml LV vol s, MOD A4C: 24.5 ml LV SV MOD A4C:     51.0 ml RIGHT VENTRICLE            IVC RV Basal diam:  3.10 cm    IVC diam: 2.40 cm RV S prime:     8.27 cm/s TAPSE (M-mode): 1.6 cm LEFT ATRIUM             Index        RIGHT ATRIUM           Index LA diam:        3.90 cm 2.30 cm/m   RA Area:  23.10 cm LA Vol (A2C):   71.8 ml 42.29 ml/m  RA Volume:   69.80 ml  41.11 ml/m LA Vol (A4C):   50.1 ml 29.51 ml/m LA Biplane Vol: 60.8 ml 35.81 ml/m  AORTIC VALVE             PULMONIC VALVE LVOT Vmax:   79.20 cm/s  PR End Diast Vel: 4.16 msec LVOT Vmean:  51.400 cm/s LVOT VTI:    0.151 m  AORTA Ao Root diam: 2.50 cm Ao Asc diam:  3.00 cm TRICUSPID VALVE TR Peak grad:   42.0 mmHg TR Vmax:        324.00 cm/s  SHUNTS Systemic VTI:  0.15 m Systemic Diam: 1.80 cm Dina Rich MD Electronically signed by Dina Rich MD Signature Date/Time: 03/20/2023/4:30:40 PM    Final    VAS [place] LOWER EXTREMITY VENOUS  (DVT)  Result Date: 03/20/2023  Lower Venous DVT Study Patient Name:  [name] [name] Feasel  Date of Exam:   03/20/2023 Medical Rec #: [id number]     Accession #:    [id number] Date of Birth: [birth year]    Patient Gender: F Patient Age:   37 years Exam Location:  [facility] Procedure:      VAS [place] LOWER EXTREMITY VENOUS (DVT) Referring Phys: Sherryl Manges --------------------------------------------------------------------------------  Indications: Swelling, and Pain. Other Indications: Recent fall with LOC. Anticoagulation: Eliquis (prior to admission). Comparison Study: Previous exam on 06/15/22 was negative for DVT. Performing Technologist: Ernestene Mention RVT, RDMS  Examination Guidelines: A complete evaluation includes B-mode imaging, spectral Doppler, color Doppler, and power Doppler as needed of all accessible portions of each vessel. Bilateral testing is considered an integral part of a complete examination. Limited examinations for reoccurring indications may be performed as noted. The reflux portion of the exam is performed with the patient in reverse Trendelenburg.  +---------+---------------+---------+-----------+----------+-------------------+ RIGHT    CompressibilityPhasicitySpontaneityPropertiesThrombus Aging      +---------+---------------+---------+-----------+----------+-------------------+ CFV      Full           No       Yes                                      +---------+---------------+---------+-----------+----------+-------------------+ SFJ      Full                                                             +---------+---------------+---------+-----------+----------+-------------------+ FV Prox  Full           No       Yes                                      +---------+---------------+---------+-----------+----------+-------------------+ FV Mid   Full           No       Yes                                       +---------+---------------+---------+-----------+----------+-------------------+ FV DistalFull           No  Yes                                      +---------+---------------+---------+-----------+----------+-------------------+ PFV      Full                                                             +---------+---------------+---------+-----------+----------+-------------------+ POP      Full           No       Yes                                      +---------+---------------+---------+-----------+----------+-------------------+ PTV      Full                                         Not well visualized +---------+---------------+---------+-----------+----------+-------------------+ PERO     Full                                         Not well visualized +---------+---------------+---------+-----------+----------+-------------------+ pulsatile waveforms  +----+---------------+---------+-----------+----------+--------------+ LEFTCompressibilityPhasicitySpontaneityPropertiesThrombus Aging +----+---------------+---------+-----------+----------+--------------+ CFV Full           No       Yes                                 +----+---------------+---------+-----------+----------+--------------+ pulsatile waveforms    Summary: RIGHT: - There is no evidence of deep vein thrombosis in the lower extremity.  - No cystic structure found in the popliteal fossa.  LEFT: - No evidence of common femoral vein obstruction.   *See table(s) above for measurements and observations. Electronically signed by Sherald Hess MD on 03/20/2023 at 3:33:13 PM.    Final       Scheduled Meds:  acetaminophen  1,000 mg Oral Q8H   amiodarone  400 mg Oral BID   atorvastatin  20 mg Oral QHS   bisoprolol  5 mg Oral Daily   fluticasone furoate-vilanterol  1 puff Inhalation Daily   And   umeclidinium bromide  1 puff Inhalation Daily   lidocaine  1 patch Transdermal Q24H    methocarbamol  500 mg Oral QID   sodium chloride flush  3 mL Intravenous Q12H   torsemide  100 mg Oral Daily   Continuous Infusions:   LOS: 6 days   Time spent: 35 minutes   Noralee Stain, DO [facility] 03/22/2023, 1:00 PM   Available via Epic secure chat 7am-7pm After these hours, please refer to coverage provider listed on [email or web address]

## 2023-03-22 NOTE — Progress Notes (Signed)
Occupational Therapy Treatment Patient Details Name: ALESHEA GIANG MRN: [id number] DOB: [birth year] Today's Date: 03/22/2023   History of present illness Ariannah C Stronach is a 89 y.o. female who presents with scalp hematoma and epistaxis after a syncopal episode with fall.  CT head: Minimal increase in size of left tentorial leaflet subdural hematoma and bilateral nasal fractures.  Past medical history significant for COPD, chronic respiratory failure, heart block with pacemaker, pulmonary hypertension, chronic HFpEF, atrial fibrillation on Eliquis, and CKD 4.   OT comments  Patient continues to progress towards goals, with significantly less pain in neck to date. Patient noted with R leg pain, with red and swollen presentation, and warm to the touch (RN present and aware). Patient with need for significant time and effort to complete bed mobility to sit EOB to take medications, and able to complete incremental scoots to [facility] all at supervision level. Patient also with increased DOE but VSS on 2L O2. OT recommendation remains appropriate. OT will continue to follow.       If plan is discharge home, recommend the following:  A lot of help with bathing/dressing/bathroom;Assistance with cooking/housework;Assist for transportation;Help with stairs or ramp for entrance;A lot of help with walking and/or transfers   Equipment Recommendations  Other (comment) (Defer to next venue)    Recommendations for Other Services      Precautions / Restrictions Precautions Precautions: Fall Precaution Comments: neck pain Restrictions Weight Bearing Restrictions: No       Mobility Bed Mobility Overal bed mobility: Needs Assistance Bed Mobility: Supine to Sit, Sit to Supine     Supine to sit: HOB elevated, Used rails, Supervision Sit to supine: Supervision, HOB elevated, Used rails   General bed mobility comments: Able to complete at supervision level with increased effort    Transfers Overall transfer  level: Needs assistance                 General transfer comment: Patient declining as she had just been up to the [facility]     Balance Overall balance assessment: Needs assistance Sitting-balance support: Bilateral upper extremity supported, Feet supported, Feet unsupported Sitting balance-Leahy Scale: Fair     Standing balance support: Bilateral upper extremity supported, During functional activity Standing balance-Leahy Scale: Poor Standing balance comment: walker and min assist for static standing                           ADL either performed or assessed with clinical judgement   ADL Overall ADL's : Needs assistance/impaired Eating/Feeding: Set up;Sitting   Grooming: Set up;Sitting                               Functional mobility during ADLs: Moderate assistance General ADL Comments: Patient continues to progress towards goals, with significantly less pain in neck to date. Patient noted with R leg pain, with red and swollen presentation, and warm to the touch (RN present and aware). Patient with need for significant time and effort to complete bed mobility to sit EOB to take medications, and able to complete incremental scoots to [facility] all at supervision level. OT recommendation downgraded to lesser intensity rehab in order to promote return to prior level. OT will continue to follow.    Extremity/Trunk Assessment              Vision       Perception  Praxis      Cognition Arousal: Alert Behavior During Therapy: WFL for tasks assessed/performed Overall Cognitive Status: Within Functional Limits for tasks assessed                                          Exercises      Shoulder Instructions       General Comments      Pertinent Vitals/ Pain       Pain Assessment Pain Assessment: Faces Faces Pain Scale: Hurts little more Pain Location: neck; RLE Pain Descriptors / Indicators: Aching, Discomfort Pain  Intervention(s): Limited activity within patient's tolerance, Monitored during session, Repositioned  Home Living                                          Prior Functioning/Environment              Frequency  Min 1X/week        Progress Toward Goals  OT Goals(current goals can now be found in the care plan section)     Acute Rehab OT Goals Patient Stated Goal: to get better OT Goal Formulation: With patient Time For Goal Achievement: 04/02/23 Potential to Achieve Goals: Good  Plan      Co-evaluation                 AM-PAC OT "6 Clicks" Daily Activity     Outcome Measure   Help from another person eating meals?: A Little Help from another person taking care of personal grooming?: A Little Help from another person toileting, which includes using toliet, bedpan, or urinal?: A Lot Help from another person bathing (including washing, rinsing, drying)?: A Lot Help from another person to put on and taking off regular upper body clothing?: A Little Help from another person to put on and taking off regular lower body clothing?: A Lot 6 Click Score: 15    End of Session    OT Visit Diagnosis: Unsteadiness on feet (R26.81);Other abnormalities of gait and mobility (R26.89);Muscle weakness (generalized) (M62.81);History of falling (Z91.81);Pain Pain - Right/Left: Right Pain - part of body: Leg   Activity Tolerance Patient tolerated treatment well   Patient Left in bed;with call bell/phone within reach;with bed alarm set   Nurse Communication Mobility status        Time: 4332-9518 OT Time Calculation (min): 26 min  Charges: OT General Charges $OT Visit: 1 Visit OT Treatments $Self Care/Home Management : 23-37 mins  Pollyann Glen E. Ernesta Trabert, OTR/L Acute Rehabilitation Services [phone]   Cherlyn Cushing 03/22/2023, 12:19 PM

## 2023-03-22 NOTE — NC FL2 (Signed)
[place] MEDICAID FL2 LEVEL OF CARE FORM     IDENTIFICATION  Patient Name: [name] [name] Birthdate: 08-09-1931 Sex: female Admission Date (Current Location): 03/16/2023  [facility] and [place] Number:  Producer, television/film/video and Address:  [facility] [place], [address] [place], [place] [address]      Provider Number: [id number]  Attending Physician Name and Address:  Noralee Stain, DO  Relative Name and Phone Number:  Waynetta Sandy (daughter) [phone] Jonny Ruiz (son) [phone]    Current Level of Care: Hospital Recommended Level of Care: Skilled Nursing Facility [facility]) Prior Approval Number:    Date Approved/Denied:   PASRR Number: [id number]  Discharge Plan: SNF    Current Diagnoses: Patient Active Problem List   Diagnosis Date Noted   Subdural hematoma (HCC) 03/17/2023   Nasal fracture 03/17/2023   Syncope, cardiogenic 03/16/2023   Elevated troponin 03/16/2023   Ventricular arrhythmia 03/16/2023   Erythropoietin deficiency anemia 12/22/2022   AKI (acute kidney injury) (HCC) 09/05/2022   Anemia due to stage 3 chronic kidney disease (HCC) 08/28/2022   Acute on chronic diastolic (congestive) heart failure (HCC) 08/18/2022   Cellulitis of right leg 07/02/2022   Chronic obstructive asthma (HCC) 05/22/2022   Chronic respiratory failure with hypoxia (HCC) 05/22/2022   Exercise hypoxemia 04/28/2022   Essential (primary) hypertension    Hyperlipidemia, unspecified    Presence of cardiac pacemaker    Primary pulmonary hypertension (HCC)    Unspecified osteoarthritis, unspecified site    Anxiety disorder, unspecified    Atherosclerotic heart disease of native coronary artery without angina pectoris    Chronic atrial fibrillation (HCC)    Chronic diastolic (congestive) heart failure (HCC)    Chronic obstructive pulmonary disease, unspecified (HCC)    Headache    Unspecified fall, initial encounter    Degenerative disc disease, cervical  02/09/2018   CKD (chronic kidney disease) stage 4, GFR 15-29 ml/min (HCC) 05/07/2017   Pacemaker complications 09/28/2013   DOE (dyspnea on exertion) 07/19/2013   Chronic diastolic heart failure (HCC) 04/11/2013   Aortic stenosis 10/18/2012   Encounter for long-term (current) use of anticoagulants 10/08/2011   Mixed hyperlipidemia 08/25/2008   Essential hypertension, benign 08/25/2008   Coronary atherosclerosis of native coronary artery 08/25/2008   Automatic implantable cardioverter-defibrillator in situ 08/25/2008    Orientation RESPIRATION BLADDER Height & Weight     Self, Time, Situation, Place  O2 (Nasal Cannula 3 liters) Incontinent, External catheter (External Urinary Catheter) Weight: 157 lb 11.2 oz (71.5 kg) Height:  5' (152.4 cm)  BEHAVIORAL SYMPTOMS/MOOD NEUROLOGICAL BOWEL NUTRITION STATUS      Continent (WDL) Diet (Please see discharge summary)  AMBULATORY STATUS COMMUNICATION OF NEEDS Skin   Extensive Assist Verbally Other (Comment) (Abrasion,face,R,ecchymosis,abdomen,face,arm,leg,Bil.,Erythema,Lip,R,wound/Incision LDAs,Wound/Incision open or dehisced Lac.face,R,upper,hematoma,R,forehead with small laceration)                       Personal Care Assistance Level of Assistance  Bathing, Feeding, Dressing Bathing Assistance: Maximum assistance Feeding assistance: Limited assistance Dressing Assistance: Maximum assistance     Functional Limitations Info  Sight, Hearing, Speech Sight Info: Adequate Hearing Info: Adequate Speech Info: Adequate    SPECIAL CARE FACTORS FREQUENCY  PT (By licensed PT), OT (By licensed OT)     PT Frequency: 5x min weekly OT Frequency: 5x min weekly            Contractures Contractures Info: Not present    Additional Factors Info  Code Status, Allergies Code  Status Info: FULL Allergies Info: Codeine,Fentanyl,Keflex (cephalexin)           Current Medications (03/22/2023):  This is the current hospital active medication  list Current Facility-Administered Medications  Medication Dose Route Frequency Provider Last Rate Last Admin   acetaminophen (TYLENOL) tablet 1,000 mg  1,000 mg Oral Q8H Noralee Stain, DO   1,000 mg at 03/22/23 0547   amiodarone (PACERONE) tablet 400 mg  400 mg Oral BID Mealor, Roberts Gaudy, MD   400 mg at 03/22/23 1000   atorvastatin (LIPITOR) tablet 20 mg  20 mg Oral QHS Mosetta Anis, RPH   20 mg at 03/21/23 2221   bisoprolol (ZEBETA) tablet 5 mg  5 mg Oral Daily Duke Salvia, MD   5 mg at 03/22/23 1000   fluticasone furoate-vilanterol (BREO ELLIPTA) 100-25 MCG/ACT 1 puff  1 puff Inhalation Daily Opyd, Lavone Neri, MD   1 puff at 03/22/23 0811   And   umeclidinium bromide (INCRUSE ELLIPTA) 62.5 MCG/ACT 1 puff  1 puff Inhalation Daily Opyd, Lavone Neri, MD   1 puff at 03/22/23 0811   hydrocortisone cream 1 %   Topical TID PRN Noralee Stain, DO       HYDROmorphone (DILAUDID) injection 0.5 mg  0.5 mg Intravenous Q4H PRN Noralee Stain, DO   0.5 mg at 03/20/23 0836   ipratropium-albuterol (DUONEB) 0.5-2.5 (3) MG/3ML nebulizer solution 3 mL  3 mL Nebulization Q6H PRN Noralee Stain, DO       lidocaine (LIDODERM) 5 % 1 patch  1 patch Transdermal Q24H Noralee Stain, DO   1 patch at 03/22/23 1001   methocarbamol (ROBAXIN) tablet 500 mg  500 mg Oral QID Noralee Stain, DO   500 mg at 03/22/23 1000   oxyCODONE (Oxy IR/ROXICODONE) immediate release tablet 2.5 mg  2.5 mg Oral Q4H PRN Opyd, Lavone Neri, MD   2.5 mg at 03/18/23 0121   prochlorperazine (COMPAZINE) injection 10 mg  10 mg Intravenous Q6H PRN Noralee Stain, DO       senna (SENOKOT) tablet 8.6 mg  1 tablet Oral Daily PRN Opyd, Lavone Neri, MD       sodium chloride flush (NS) 0.9 % injection 3 mL  3 mL Intravenous Q12H Opyd, Lavone Neri, MD   3 mL at 03/22/23 1151   torsemide (DEMADEX) tablet 100 mg  100 mg Oral Daily Opyd, Lavone Neri, MD   100 mg at 03/22/23 1610     Discharge Medications: Please see discharge summary for a list of discharge  medications.  Relevant Imaging Results:  Relevant Lab Results:   Additional Information [id number]  Delilah Shan, LCSWA

## 2023-03-22 NOTE — Progress Notes (Signed)
Mobility Specialist Progress Note:    03/22/23 1400  Mobility  Activity Refused mobility  Mobility Specialist Start Time (ACUTE ONLY) 1440   Pt refused mobility d/t fatigue and stated "OT is coming back again today and I can only do so much in one day". Will f/u as able.    D'Vante Earlene Plater Mobility Specialist Please contact via Special educational needs teacher or Rehab office at [phone]

## 2023-03-23 ENCOUNTER — Encounter (HOSPITAL_COMMUNITY): Payer: Medicare PPO

## 2023-03-23 DIAGNOSIS — J449 Chronic obstructive pulmonary disease, unspecified: Secondary | ICD-10-CM | POA: Diagnosis not present

## 2023-03-23 DIAGNOSIS — N184 Chronic kidney disease, stage 4 (severe): Secondary | ICD-10-CM | POA: Diagnosis not present

## 2023-03-23 DIAGNOSIS — I5032 Chronic diastolic (congestive) heart failure: Secondary | ICD-10-CM | POA: Diagnosis not present

## 2023-03-23 DIAGNOSIS — R0602 Shortness of breath: Secondary | ICD-10-CM | POA: Diagnosis not present

## 2023-03-23 DIAGNOSIS — I129 Hypertensive chronic kidney disease with stage 1 through stage 4 chronic kidney disease, or unspecified chronic kidney disease: Secondary | ICD-10-CM | POA: Diagnosis not present

## 2023-03-23 DIAGNOSIS — Z23 Encounter for immunization: Secondary | ICD-10-CM | POA: Diagnosis present

## 2023-03-23 DIAGNOSIS — I700 Atherosclerosis of aorta: Secondary | ICD-10-CM | POA: Diagnosis not present

## 2023-03-23 DIAGNOSIS — I214 Non-ST elevation (NSTEMI) myocardial infarction: Secondary | ICD-10-CM | POA: Diagnosis not present

## 2023-03-23 DIAGNOSIS — S065X0D Traumatic subdural hemorrhage without loss of consciousness, subsequent encounter: Secondary | ICD-10-CM | POA: Diagnosis not present

## 2023-03-23 DIAGNOSIS — I482 Chronic atrial fibrillation, unspecified: Secondary | ICD-10-CM | POA: Diagnosis not present

## 2023-03-23 DIAGNOSIS — D631 Anemia in chronic kidney disease: Secondary | ICD-10-CM | POA: Diagnosis not present

## 2023-03-23 DIAGNOSIS — R0609 Other forms of dyspnea: Secondary | ICD-10-CM | POA: Diagnosis not present

## 2023-03-23 DIAGNOSIS — S022XXD Fracture of nasal bones, subsequent encounter for fracture with routine healing: Secondary | ICD-10-CM | POA: Diagnosis not present

## 2023-03-23 DIAGNOSIS — E782 Mixed hyperlipidemia: Secondary | ICD-10-CM | POA: Diagnosis not present

## 2023-03-23 DIAGNOSIS — W19XXXA Unspecified fall, initial encounter: Secondary | ICD-10-CM | POA: Diagnosis not present

## 2023-03-23 DIAGNOSIS — I480 Paroxysmal atrial fibrillation: Secondary | ICD-10-CM | POA: Diagnosis not present

## 2023-03-23 DIAGNOSIS — S022XXS Fracture of nasal bones, sequela: Secondary | ICD-10-CM | POA: Diagnosis not present

## 2023-03-23 DIAGNOSIS — I270 Primary pulmonary hypertension: Secondary | ICD-10-CM | POA: Diagnosis not present

## 2023-03-23 DIAGNOSIS — I2510 Atherosclerotic heart disease of native coronary artery without angina pectoris: Secondary | ICD-10-CM | POA: Diagnosis not present

## 2023-03-23 DIAGNOSIS — R55 Syncope and collapse: Secondary | ICD-10-CM | POA: Diagnosis not present

## 2023-03-23 DIAGNOSIS — Z7401 Bed confinement status: Secondary | ICD-10-CM | POA: Diagnosis not present

## 2023-03-23 DIAGNOSIS — L03115 Cellulitis of right lower limb: Secondary | ICD-10-CM | POA: Diagnosis not present

## 2023-03-23 DIAGNOSIS — S065XAA Traumatic subdural hemorrhage with loss of consciousness status unknown, initial encounter: Secondary | ICD-10-CM | POA: Diagnosis not present

## 2023-03-23 DIAGNOSIS — I4901 Ventricular fibrillation: Secondary | ICD-10-CM | POA: Diagnosis not present

## 2023-03-23 DIAGNOSIS — I442 Atrioventricular block, complete: Secondary | ICD-10-CM | POA: Diagnosis not present

## 2023-03-23 LAB — CBC
HCT: 33 % — ABNORMAL LOW (ref 36.0–46.0)
Hemoglobin: 10.5 g/dL — ABNORMAL LOW (ref 12.0–15.0)
MCH: 31.4 pg (ref 26.0–34.0)
MCHC: 31.8 g/dL (ref 30.0–36.0)
MCV: 98.8 fL (ref 80.0–100.0)
Platelets: 225 10*3/uL (ref 150–400)
RBC: 3.34 MIL/uL — ABNORMAL LOW (ref 3.87–5.11)
RDW: 14.6 % (ref 11.5–15.5)
WBC: 11.5 10*3/uL — ABNORMAL HIGH (ref 4.0–10.5)
nRBC: 0 % (ref 0.0–0.2)

## 2023-03-23 LAB — BASIC METABOLIC PANEL
Anion gap: 13 (ref 5–15)
BUN: 59 mg/dL — ABNORMAL HIGH (ref 8–23)
CO2: 24 mmol/L (ref 22–32)
Calcium: 8.8 mg/dL — ABNORMAL LOW (ref 8.9–10.3)
Chloride: 99 mmol/L (ref 98–111)
Creatinine, Ser: 1.93 mg/dL — ABNORMAL HIGH (ref 0.44–1.00)
GFR, Estimated: 24 mL/min — ABNORMAL LOW (ref >60)
Glucose, Bld: 101 mg/dL — ABNORMAL HIGH (ref 70–99)
Potassium: 3.9 mmol/L (ref 3.5–5.1)
Sodium: 136 mmol/L (ref 135–145)

## 2023-03-23 MED ORDER — AMOXICILLIN-POT CLAVULANATE 500-125 MG PO TABS: 1.0000 | ORAL_TABLET | Freq: Two times a day (BID) | ORAL | Status: AC

## 2023-03-23 MED ORDER — APIXABAN 2.5 MG PO TABS: 2.5000 mg | ORAL_TABLET | Freq: Two times a day (BID) | ORAL | Status: DC

## 2023-03-23 MED ORDER — AMIODARONE HCL 400 MG PO TABS: ORAL_TABLET | ORAL | Status: DC

## 2023-03-23 MED ORDER — BISOPROLOL FUMARATE 5 MG PO TABS: 5.0000 mg | ORAL_TABLET | Freq: Every day | ORAL | Status: DC

## 2023-03-23 MED ORDER — METHOCARBAMOL 500 MG PO TABS: 500.0000 mg | ORAL_TABLET | Freq: Four times a day (QID) | ORAL | Status: DC | PRN

## 2023-03-23 NOTE — TOC Transition Note (Signed)
Transition of Care [facility]) - CM/SW Discharge Note   Patient Details  Name: [name] [name] MRN: [id number] Date of Birth: 14-Jan-1932  Transition of Care [facility]) CM/SW Contact:  Delilah Shan, LCSWA Phone Number: 03/23/2023, 12:31 PM   Clinical Narrative:     Patient will DC to: [facility] SNF   Anticipated DC date: 03/23/2023  Family notified: Jonny Ruiz  Transport by: Sharin Mons  ?  Per MD patient ready for DC to [facility] . RN, patient, patient's family, and facility notified of DC. Discharge Summary sent to facility. RN given number for report [phone] RM# [phone]. DC packet on chart. Ambulance transport requested for patient.  CSW signing off.   Final next level of care: Skilled Nursing Facility Barriers to Discharge: No Barriers Identified   Patient Goals and CMS Choice   Choice offered to / list presented to : Adult Children ([name])  Discharge Placement                Patient chooses bed at: [facility] Patient to be transferred to facility by: [facility] Name of family member notified: [name] Patient and family notified of of transfer: 03/23/23  Discharge Plan and Services Additional resources added to the After Visit Summary for                                       Social Determinants of Health (SDOH) Interventions SDOH Screenings   Food Insecurity: No Food Insecurity (03/16/2023)  Housing: Low Risk  (03/16/2023)  Transportation Needs: No Transportation Needs (03/16/2023)  Utilities: Not At Risk (03/16/2023)  Depression (PHQ2-9): High Risk (12/17/2022)  Tobacco Use: Low Risk  (03/16/2023)     Readmission Risk Interventions     No data to display

## 2023-03-23 NOTE — Discharge Summary (Signed)
Physician Discharge Summary  [name] [name] UJW:[id number] DOB: 04/17/1932 DOA: 03/16/2023  PCP: [name], MD  Admit date: 03/16/2023 Discharge date: 03/23/2023  Admitted From: ALF Disposition: SNF  Recommendations for Outpatient Follow-up:  Follow up with PCP Follow-up with cardiology Outpatient ENT referral for bilateral nasal fractures has been sent   Discharge Condition: Stable CODE STATUS: Full code Diet recommendation: Heart healthy diet  Brief/Interim Summary: [name] [name] is a 88 y.o. female with medical history significant for COPD, chronic respiratory failure, heart block with pacemaker, pulmonary hypertension, chronic HFpEF, atrial fibrillation on Eliquis, and CKD 4 who presents with scalp hematoma and epistaxis after a syncopal episode with fall.   Patient was at cardiac rehab and was transferring from her walker to a chair when she had a sudden loss of consciousness and fell, hitting her face and head.  She quickly regained awareness and had epistaxis and a frontal scalp hematoma.  She denies any recent chest pain.  She has not felt a shock from her cardiac device.  She reports mild dyspnea without cough, denies fever or chills, and notes that her bilateral lower extremity swelling is about the same as usual.   Upon interrogation of her ICD, was found that patient had V-fib arrest earlier and was defibrillated out of it.  Patient was admitted and cardiology consulted.   See below for further details.  Discharge Diagnoses:   Principal Problem:   Syncope, cardiogenic Active Problems:   Coronary atherosclerosis of native coronary artery   CKD (chronic kidney disease) stage 4, GFR 15-29 ml/min (HCC)   Essential (primary) hypertension   Primary pulmonary hypertension (HCC)   Chronic atrial fibrillation (HCC)   Chronic obstructive pulmonary disease, unspecified (HCC)   Chronic respiratory failure with hypoxia (HCC)   Elevated troponin   Ventricular arrhythmia    Subdural hematoma (HCC)   Nasal fracture    Cardiogenic syncope V-fib/V-tach Hx A-fib -ICD interrogation found patient had torsades de points, V-fib and received 1 shock -Tikosyn discontinued -Amiodarone --> 400mg  BID x 1 week, then reduce to 200mg  BID until follow-up  -Cardiology following -Follow-up outpatient -Resume Eliquis 12/11 as long as she remains without any focal deficits or neurochanges   Left subdural hematoma -CT head: Minimal increase in size of left tentorial leaflet subdural hematoma, now measuring up to 3 mm, previously 2 mm. No new sites of hemorrhage identified. -Discussed with neurosurgery over the phone 11/27.  No surgical interventions required.  Consider repeat head imaging if patient has any mental decline.  Ideally, patient would need to be off anticoagulation for 2 weeks.  However, considering her ACS and A-fib, could consider starting short acting anticoagulation such as IV heparin or Lovenox in 3 days and monitor closely -Repeat CT head showing mild interval increase in size of small subdural hematoma extending along the posterior falx and left tentorium, still measuring up to 3 mm in maximal thickness. No significant mass effect or midline shift.   Bilateral nasal fractures -Allow swelling to go down, she will need outpatient ENT evaluation   CAD NSTEMI -Troponin 34, 180, 243 -Lipitor   COPD with chronic hypoxic respiratory failure -Continue inhalers   Chronic diastolic CHF, pulmonary hypertension -BNP 747.5 -Torsemide, bisoprolol   CKD stage 3B- IV -Baseline creatinine 2.63 -Stable   Neck pain -Cervical spine CT negative -Continue Robaxin prn  -Repeat CT cervical spine: No acute fracture or traumatic subluxation of the cervical spine.    Leukocytosis -Remains afebrile, unclear if this is reactive leukocytosis  in setting of torsades and ICD firing, syncope and hematoma versus infectious process -Blood cultures negative to date -UA  negative -Chest x-ray without focal consolidation -?Right leg cellulitis --> Augmentin x 5 days  Discharge Instructions  Discharge Instructions     Diet - low sodium heart healthy   Complete by: As directed    Increase activity slowly   Complete by: As directed    No wound care   Complete by: As directed       Allergies as of 03/23/2023       Reactions   Codeine Nausea And Vomiting   Fentanyl Itching   Itching, redness to scalp and neck   Keflex [cephalexin] Itching, Rash        Medication List     STOP taking these medications    dofetilide 125 MCG capsule Commonly known as: TIKOSYN   losartan 25 MG tablet Commonly known as: COZAAR       TAKE these medications    acetaminophen 325 MG tablet Commonly known as: TYLENOL Take 2 tablets (650 mg total) by mouth every 6 (six) hours as needed for mild pain (or Fever >/= 101).   albuterol 108 (90 Base) MCG/ACT inhaler Commonly known as: VENTOLIN HFA Inhale 2 puffs into the lungs every 6 (six) hours as needed for wheezing or shortness of breath.   amiodarone 400 MG tablet Commonly known as: PACERONE Take 1 tablet (400 mg total) by mouth 2 (two) times daily for 7 days, THEN 0.5 tablets (200 mg total) 2 (two) times daily. Start taking on: March 23, 2023   amoxicillin-clavulanate 500-125 MG tablet Commonly known as: AUGMENTIN Take 1 tablet by mouth every 12 (twelve) hours for 4 days.   apixaban 2.5 MG Tabs tablet Commonly known as: Eliquis Take 1 tablet (2.5 mg total) by mouth 2 (two) times daily. Start taking on: March 31, 2023 What changed: These instructions start on March 31, 2023. If you are unsure what to do until then, ask your doctor or other care provider.   atorvastatin 20 MG tablet Commonly known as: LIPITOR Take 1 tablet (20 mg total) by mouth daily.   bisoprolol 5 MG tablet Commonly known as: ZEBETA Take 1 tablet (5 mg total) by mouth daily. Start taking on: March 24, 2023    CALCIUM 600 + D PO Take 1 tablet by mouth 2 (two) times daily.   dapagliflozin propanediol 10 MG Tabs tablet Commonly known as: Farxiga Take 1 tablet (10 mg total) by mouth daily before breakfast.   folic acid 1 MG tablet Commonly known as: FOLVITE Take 1 tablet (1 mg total) by mouth daily.   methocarbamol 500 MG tablet Commonly known as: ROBAXIN Take 1 tablet (500 mg total) by mouth every 6 (six) hours as needed for muscle spasms.   polyethylene glycol 17 g packet Commonly known as: MIRALAX / GLYCOLAX Take 17 g by mouth daily as needed for mild constipation.   SYSTANE OP Place 1 drop into both eyes daily as needed (Dry eyes).   torsemide 100 MG tablet Commonly known as: DEMADEX Take 1 tablet (100 mg total) by mouth daily.   Trelegy Ellipta 100-62.5-25 MCG/ACT Aepb Generic drug: Fluticasone-Umeclidin-Vilant Inhale 1 puff into the lungs daily.   triamcinolone cream 0.1 % Commonly known as: KENALOG Apply 1 application. topically daily as needed (itching).        Follow-up Information     [name], MD Follow up.   Specialty: Internal Medicine Contact information: [address]  [address] [place] [address] [phone]         [facility] Follow up.   Specialty: Cardiology Contact information: [address] [place] [address] [phone]               Allergies  Allergen Reactions   Codeine Nausea And Vomiting   Fentanyl Itching    Itching, redness to scalp and neck   Keflex [Cephalexin] Itching and Rash    Procedures/Studies: CT CERVICAL SPINE WO CONTRAST  Result Date: 03/21/2023 CLINICAL DATA:  Neck pain EXAM: CT CERVICAL SPINE WITHOUT CONTRAST TECHNIQUE: Multidetector CT imaging of the cervical spine was performed without intravenous contrast. Multiplanar CT image reconstructions were also generated. RADIATION DOSE REDUCTION: This exam was performed according to the departmental dose-optimization  program which includes automated exposure control, adjustment of the mA and/or kV according to patient size and/or use of iterative reconstruction technique. COMPARISON:  Cervical spine CT 03/16/2023 FINDINGS: Alignment: Normal. Skull base and vertebrae: The bones are osteopenic. No acute fracture or focal osseous lesion identified. Soft tissues and spinal canal: No prevertebral fluid or swelling. No visible canal hematoma. Disc levels: There is disc space narrowing throughout the cervical spine most significant at C3-C4 and C4-C5 as well as T1-T2. Bilateral facet arthropathy is present causing mild bilateral neural foraminal stenosis at C3-C4 and C4-C5. There is no severe central canal stenosis at any level. Upper chest: Small pleural effusions are again seen. Other: There are atherosclerotic calcifications of the bilateral carotid artery bifurcations. There is a small amount of hemorrhage along the left tentorium. Please see dedicated CT head for further description. IMPRESSION: 1. No acute fracture or traumatic subluxation of the cervical spine. 2. Multilevel degenerative changes of the cervical spine. 3. Small amount of hemorrhage along the left tentorium. Please see dedicated CT head for further description. Electronically Signed   By: Darliss Cheney M.D.   On: 03/21/2023 21:19   CT HEAD WO CONTRAST ( )  Result Date: 03/21/2023 CLINICAL DATA:  Follow-up examination for known subdural hematoma. EXAM: CT HEAD WITHOUT CONTRAST TECHNIQUE: Contiguous axial images were obtained from the base of the skull through the vertex without intravenous contrast. RADIATION DOSE REDUCTION: This exam was performed according to the departmental dose-optimization program which includes automated exposure control, adjustment of the mA and/or kV according to patient size and/or use of iterative reconstruction technique. COMPARISON:  CT from 03/17/2023. FINDINGS: Brain: Previously identified small subdural hematoma extending  along the posterior falx and left tentorium again seen, increased in size now measuring up to 3 mm in maximal thickness. No significant mass effect or midline shift. No other acute intracranial hemorrhage. No acute large vessel territory infarct. No mass lesion or mass effect. No hydrocephalus. Underlying atrophy with chronic small vessel ischemic disease noted. Vascular: No abnormal hyperdense vessel. Calcified atherosclerosis present at the skull base. Skull: Evolving right frontal scalp contusion. Calvarium remains intact. Sinuses/Orbits: Globes and orbital soft tissues demonstrate no acute finding. Ocular senescent calcifications noted. Mild mucosal thickening present about the sphenoid ethmoidal sinuses. Chronic left orbital floor fracture noted. Mastoid air cells are largely clear. Other: None. IMPRESSION: 1. Mild interval increase in size of small subdural hematoma extending along the posterior falx and left tentorium, still measuring up to 3 mm in maximal thickness. No significant mass effect or midline shift. 2. No other acute intracranial abnormality. 3. Evolving right frontal scalp contusion. No calvarial fracture. Electronically Signed   By: Rise Mu M.D.   On:  03/21/2023 21:09   ECHOCARDIOGRAM COMPLETE  Result Date: 03/20/2023    ECHOCARDIOGRAM REPORT   Patient Name:   [name] [name] Bodkins Date of Exam: 03/20/2023 Medical Rec #:  [id number]    Height:       60.0 in Accession #:    [id number]   Weight:       160.1 lb Date of Birth:  08-13-1931   BSA:          1.698 m Patient Age:    89 years     BP:           144/61 mmHg Patient Gender: F            HR:           75 bpm. Exam Location:  Inpatient Procedure: 2D Echo, Cardiac Doppler and Color Doppler Indications:    cardiac arrest  History:        Patient has prior history of Echocardiogram examinations, most                 recent 07/08/2022. Defibrillator, chronic kidney disease; Risk                 Factors:Hypertension and Dyslipidemia.   Sonographer:    Delcie Roch RDCS Referring Phys: [phone] Salvatore Decent KLEIN IMPRESSIONS  1. Left ventricular ejection fraction, by estimation, is 55 to 60%. The left ventricle has normal function. Left ventricular endocardial border not optimally defined to evaluate regional wall motion. Left ventricular diastolic parameters are indeterminate. There is the interventricular septum is flattened in systole and diastole, consistent with right ventricular pressure and volume overload.  2. Right ventricular systolic function mild to moderately reduced. The right ventricular size is moderately enlarged. There is moderately elevated pulmonary artery systolic pressure.  3. Left atrial size was mildly dilated.  4. Right atrial size was mildly dilated.  5. The mitral valve was not well visualized. No evidence of mitral valve regurgitation. No evidence of mitral stenosis.  6. The tricuspid valve is abnormal.  7. The aortic valve is tricuspid. There is mild calcification of the aortic valve. There is mild thickening of the aortic valve. Aortic valve regurgitation is not visualized. No aortic stenosis is present.  8. The inferior vena cava is dilated in size with <50% respiratory variability, suggesting right atrial pressure of 15 mmHg. FINDINGS  Left Ventricle: Left ventricular ejection fraction, by estimation, is 55 to 60%. The left ventricle has normal function. Left ventricular endocardial border not optimally defined to evaluate regional wall motion. Definity contrast agent was given IV to delineate the left ventricular endocardial borders. The left ventricular internal cavity size was normal in size. There is no left ventricular hypertrophy. The interventricular septum is flattened in systole and diastole, consistent with right ventricular pressure and volume overload. Left ventricular diastolic parameters are indeterminate. Right Ventricle: The right ventricular size is moderately enlarged. Right vetricular wall thickness  was not well visualized. Right ventricular systolic function mild to moderately reduced. There is moderately elevated pulmonary artery systolic pressure. The tricuspid regurgitant velocity is 3.24 m/s, and with an assumed right atrial pressure of 15 mmHg, the estimated right ventricular systolic pressure is 57.0 mmHg. Left Atrium: Left atrial size was mildly dilated. Right Atrium: Right atrial size was mildly dilated. Pericardium: There is no evidence of pericardial effusion. Mitral Valve: The mitral valve was not well visualized. No evidence of mitral valve regurgitation. No evidence of mitral valve stenosis. Tricuspid Valve: The tricuspid valve is abnormal. Tricuspid  valve regurgitation is mild . No evidence of tricuspid stenosis. Aortic Valve: The aortic valve is tricuspid. There is mild calcification of the aortic valve. There is mild thickening of the aortic valve. There is mild to moderate aortic valve annular calcification. Aortic valve regurgitation is not visualized. No aortic stenosis is present. Pulmonic Valve: The pulmonic valve was not well visualized. Pulmonic valve regurgitation is mild. No evidence of pulmonic stenosis. Aorta: The aortic root and ascending aorta are structurally normal, with no evidence of dilitation. Venous: The inferior vena cava is dilated in size with less than 50% respiratory variability, suggesting right atrial pressure of 15 mmHg. IAS/Shunts: No atrial level shunt detected by color flow Doppler. Additional Comments: A device lead is visualized in the right atrium and right ventricle.  LEFT VENTRICLE PLAX 2D LVIDd:         4.20 cm     Diastology LVIDs:         2.40 cm     LV e' medial: 8.70 cm/s LV PW:         1.00 cm LV IVS:        1.00 cm LVOT diam:     1.80 cm LV SV:         38 LV SV Index:   23 LVOT Area:     2.54 cm  LV Volumes (MOD) LV vol d, MOD A4C: 51.0 ml LV vol s, MOD A4C: 24.5 ml LV SV MOD A4C:     51.0 ml RIGHT VENTRICLE            IVC RV Basal diam:  3.10 cm     IVC diam: 2.40 cm RV S prime:     8.27 cm/s TAPSE (M-mode): 1.6 cm LEFT ATRIUM             Index        RIGHT ATRIUM           Index LA diam:        3.90 cm 2.30 cm/m   RA Area:     23.10 cm LA Vol (A2C):   71.8 ml 42.29 ml/m  RA Volume:   69.80 ml  41.11 ml/m LA Vol (A4C):   50.1 ml 29.51 ml/m LA Biplane Vol: 60.8 ml 35.81 ml/m  AORTIC VALVE             PULMONIC VALVE LVOT Vmax:   79.20 cm/s  PR End Diast Vel: 4.16 msec LVOT Vmean:  51.400 cm/s LVOT VTI:    0.151 m  AORTA Ao Root diam: 2.50 cm Ao Asc diam:  3.00 cm TRICUSPID VALVE TR Peak grad:   42.0 mmHg TR Vmax:        324.00 cm/s  SHUNTS Systemic VTI:  0.15 m Systemic Diam: 1.80 cm Dina Rich MD Electronically signed by Dina Rich MD Signature Date/Time: 03/20/2023/4:30:40 PM    Final    VAS [place] LOWER EXTREMITY VENOUS (DVT)  Result Date: 03/20/2023  Lower Venous DVT Study Patient Name:  [name] [name]  Date of Exam:   03/20/2023 Medical Rec #: [id number]     Accession #:    [id number] Date of Birth: 1931/08/09    Patient Gender: F Patient Age:   55 years Exam Location:  [facility] Procedure:      VAS [place] LOWER EXTREMITY VENOUS (DVT) Referring Phys: Sherryl Manges --------------------------------------------------------------------------------  Indications: Swelling, and Pain. Other Indications: Recent fall with LOC. Anticoagulation: Eliquis (prior to admission). Comparison Study: Previous exam on  06/15/22 was negative for DVT. Performing Technologist: Ernestene Mention RVT, RDMS  Examination Guidelines: A complete evaluation includes B-mode imaging, spectral Doppler, color Doppler, and power Doppler as needed of all accessible portions of each vessel. Bilateral testing is considered an integral part of a complete examination. Limited examinations for reoccurring indications may be performed as noted. The reflux portion of the exam is performed with the patient in reverse Trendelenburg.   +---------+---------------+---------+-----------+----------+-------------------+ RIGHT    CompressibilityPhasicitySpontaneityPropertiesThrombus Aging      +---------+---------------+---------+-----------+----------+-------------------+ CFV      Full           No       Yes                                      +---------+---------------+---------+-----------+----------+-------------------+ SFJ      Full                                                             +---------+---------------+---------+-----------+----------+-------------------+ FV Prox  Full           No       Yes                                      +---------+---------------+---------+-----------+----------+-------------------+ FV Mid   Full           No       Yes                                      +---------+---------------+---------+-----------+----------+-------------------+ FV DistalFull           No       Yes                                      +---------+---------------+---------+-----------+----------+-------------------+ PFV      Full                                                             +---------+---------------+---------+-----------+----------+-------------------+ POP      Full           No       Yes                                      +---------+---------------+---------+-----------+----------+-------------------+ PTV      Full                                         Not well visualized +---------+---------------+---------+-----------+----------+-------------------+ PERO     Full  Not well visualized +---------+---------------+---------+-----------+----------+-------------------+ pulsatile waveforms  +----+---------------+---------+-----------+----------+--------------+ LEFTCompressibilityPhasicitySpontaneityPropertiesThrombus Aging +----+---------------+---------+-----------+----------+--------------+ CFV Full            No       Yes                                 +----+---------------+---------+-----------+----------+--------------+ pulsatile waveforms    Summary: RIGHT: - There is no evidence of deep vein thrombosis in the lower extremity.  - No cystic structure found in the popliteal fossa.  LEFT: - No evidence of common femoral vein obstruction.   *See table(s) above for measurements and observations. Electronically signed by Sherald Hess MD on 03/20/2023 at 3:33:13 PM.    Final    DG CHEST PORT 1 VIEW  Result Date: 03/19/2023 CLINICAL DATA:  Leukocytosis. EXAM: PORTABLE CHEST 1 VIEW COMPARISON:  03/16/2023. FINDINGS: There is mild pulmonary vascular congestion. There are bilateral small layering pleural effusions. There are bibasilar opacities, which may represent atelectasis and/or consolidation. Correlate clinically. Bilateral lung fields are otherwise clear. No pneumothorax. Stable cardio-mediastinal silhouette. There are surgical staples along the heart border and sternotomy wires, status post CABG (coronary artery bypass graft). There is a left sided 3-lead pacemaker. No acute osseous abnormalities. The soft tissues are within normal limits. IMPRESSION: *Findings favor mild congestive heart failure/pulmonary edema. Electronically Signed   By: Jules Schick M.D.   On: 03/19/2023 11:56   CT HEAD WO CONTRAST ( )  Result Date: 03/17/2023 CLINICAL DATA:  Subdural hematoma new left subdural hematoma EXAM: CT HEAD WITHOUT CONTRAST TECHNIQUE: Contiguous axial images were obtained from the base of the skull through the vertex without intravenous contrast. RADIATION DOSE REDUCTION: This exam was performed according to the departmental dose-optimization program which includes automated exposure control, adjustment of the mA and/or kV according to patient size and/or use of iterative reconstruction technique. COMPARISON:  Head CT 03/16/23, CT Face 03/17/23 FINDINGS: Brain: Slightly increased size of the  subdural hematoma along the left tentorial leaflet, now measuring up to 3 mm, previously 2 mm. No new sites of hemorrhage identified. No hydrocephalus. No extra-axial fluid collection. No CT evidence of an acute cortical infarct. No mass effect. No mass lesion. Vascular: No hyperdense vessel or unexpected calcification. Skull: Soft tissue swelling along the right frontal scalp extending inferiorly to the level of the nasal bridge. There may be a mildly displaced nasal bone fracture on the left (series 4, image 4). Sinuses/Orbits: No middle ear or mastoid effusion. Mucosal thickening in the bilateral sphenoid sinuses. Bilateral lens replacement. Orbits are otherwise unremarkable. Other: None. IMPRESSION: Minimal increase in size of left tentorial leaflet subdural hematoma, now measuring up to 3 mm, previously 2 mm. No new sites of hemorrhage identified. Electronically Signed   By: Lorenza Cambridge M.D.   On: 03/17/2023 10:55   CT MAXILLOFACIAL WO CONTRAST  Addendum Date: 03/17/2023   ADDENDUM REPORT: 03/17/2023 09:23 ADDENDUM: Impression #1 called by telephone at the time of interpretation on 03/17/2023 at 9:20 am to provider Noralee Stain, who verbally acknowledged these results. Electronically Signed   By: Jackey Loge D.O.   On: 03/17/2023 09:23   Result Date: 03/17/2023 CLINICAL DATA:  Provided history: Facial trauma, blunt. EXAM: CT MAXILLOFACIAL WITHOUT CONTRAST TECHNIQUE: Multidetector CT imaging of the maxillofacial structures was performed. Multiplanar CT image reconstructions were also generated. RADIATION DOSE REDUCTION: This exam was performed according to the departmental dose-optimization program which includes automated exposure control, adjustment of the mA and/or  kV according to patient size and/or use of iterative reconstruction technique. COMPARISON:  Prior head CT examinations 03/16/2023 and earlier. Maxillofacial CT 08/29/2007. FINDINGS: Osseous: Displaced bilateral nasal bone fractures with  overlying soft tissue swelling, likely acute. Chronic, depressed fracture deformity of the left orbital floor. Orbits: Chronic, depressed fracture deformity of the left orbital floor. No acute orbital finding. Sinuses: Trace mucosal thickening within the bilateral ethmoid sinuses. Moderate mucosal thickening within the left sphenoid sinus. Small-volume frothy secretions within the right sphenoid sinus. Soft tissues: Forehead and right frontotemporal scalp hematomas. Nasal soft tissue swelling. Limited intracranial: Thin acute subdural hematoma along the left tentorium (measuring up to 2 mm in thickness), new from the prior head CT of 03/16/2023. Attempts are being made to reach the ordering provider regarding impression #1 at this time. IMPRESSION: 1. Thin acute subdural hematoma along the left tentorium (measuring up to 2 mm in thickness), new from yesterday's head CT. 2. Displaced bilateral nasal bone fractures with overlying soft tissue swelling, likely acute. 3. Chronic, depressed fracture deformity of the left orbital floor. 4. Forehead and right frontotemporal scalp hematomas. 5. Paranasal sinus disease as described. Electronically Signed: By: Jackey Loge D.O. On: 03/17/2023 09:01   CT Cervical Spine Wo Contrast  Result Date: 03/16/2023 CLINICAL DATA:  Syncope EXAM: CT CERVICAL SPINE WITHOUT CONTRAST TECHNIQUE: Multidetector CT imaging of the cervical spine was performed without intravenous contrast. Multiplanar CT image reconstructions were also generated. RADIATION DOSE REDUCTION: This exam was performed according to the departmental dose-optimization program which includes automated exposure control, adjustment of the mA and/or kV according to patient size and/or use of iterative reconstruction technique. COMPARISON:  CT cervical spine 02/15/2018 FINDINGS: Alignment: Normal. Skull base and vertebrae: No acute fracture. No primary bone lesion or focal pathologic process. Soft tissues and spinal canal:  No prevertebral fluid or swelling. No visible canal hematoma. Disc levels: There is disc space narrowing throughout the cervical spine most significant at C3-C4 and C4-C5 compatible with degenerative change. There is no severe central canal or neural foraminal stenosis at any level. Upper chest: There are small bilateral pleural effusions. Other: Left-sided pacemaker is present. IMPRESSION: 1. No acute fracture or traumatic subluxation of the cervical spine. 2. Small bilateral pleural effusions. Electronically Signed   By: Darliss Cheney M.D.   On: 03/16/2023 18:49   CT Head Wo Contrast  Result Date: 03/16/2023 CLINICAL DATA:  Trauma syncope EXAM: CT HEAD WITHOUT CONTRAST TECHNIQUE: Contiguous axial images were obtained from the base of the skull through the vertex without intravenous contrast. RADIATION DOSE REDUCTION: This exam was performed according to the departmental dose-optimization program which includes automated exposure control, adjustment of the mA and/or kV according to patient size and/or use of iterative reconstruction technique. COMPARISON:  CT brain 03/26/2018 FINDINGS: Brain: No acute territorial infarction, hemorrhage or intracranial mass. Atrophy and chronic small vessel ischemic changes of the white matter. Nonenlarged ventricles Vascular: No hyperdense vessels.  Carotid vascular calcification Skull: Normal. Negative for fracture or focal lesion. Sinuses/Orbits: No acute finding. Other: Moderate right forehead scalp hematoma IMPRESSION: 1. No CT evidence for acute intracranial abnormality. 2. Atrophy and chronic small vessel ischemic changes of the white matter. 3. Moderate right forehead scalp hematoma. Electronically Signed   By: Jasmine Pang M.D.   On: 03/16/2023 18:48   DG Knee Complete 4 Views Right  Result Date: 03/16/2023 CLINICAL DATA:  Fall at rehab today. EXAM: RIGHT KNEE - COMPLETE 4+ VIEW COMPARISON:  Right knee radiographs 08/05/2017 FINDINGS: There is  diffuse decreased  bone mineralization. Mild medial and lateral compartment joint space narrowing. Mild to moderate medial and lateral compartment chondrocalcinosis. Mild patellofemoral joint space narrowing and peripheral osteophytosis. No joint effusion. Moderate to high-grade atherosclerotic calcifications. Surgical clips overlie the posteromedial distal thigh and proximal calf. IMPRESSION: 1. Mild tricompartmental osteoarthritis. 2. Mild to moderate medial and lateral compartment chondrocalcinosis. 3. No acute fracture. Electronically Signed   By: Neita Garnet M.D.   On: 03/16/2023 18:46   DG Knee Complete 4 Views Left  Result Date: 03/16/2023 CLINICAL DATA:  Fall. EXAM: LEFT KNEE - COMPLETE 4+ VIEW COMPARISON:  None Available. FINDINGS: There is mildly decreased bone mineralization. Mild-to-moderate medial compartment joint space narrowing and peripheral osteophytosis. Mild patellofemoral joint space narrowing and peripheral osteophytosis. Mild-to-moderate medial and lateral compartment chondrocalcinosis. No joint effusion. No acute fracture or dislocation. Moderate to high-grade atherosclerotic calcifications. IMPRESSION: 1. Mild-to-moderate medial and mild patellofemoral compartment osteoarthritis. 2. Mild-to-moderate medial and lateral compartment chondrocalcinosis. Electronically Signed   By: Neita Garnet M.D.   On: 03/16/2023 18:44   DG Chest Portable 1 View  Result Date: 03/16/2023 CLINICAL DATA:  Syncope. EXAM: PORTABLE CHEST 1 VIEW COMPARISON:  08/23/2022. FINDINGS: There is mild pulmonary vascular congestion. Bilateral small pleural effusions, decreased on the left side and increased on the right side. Redemonstration of right apical pleural cap. Bilateral lung fields are otherwise clear. No pneumothorax. Stable cardio-mediastinal silhouette. There are surgical staples along the heart border and sternotomy wires, status post CABG (coronary artery bypass graft). There is a left sided 3-lead pacemaker. No acute  osseous abnormalities. The soft tissues are within normal limits. IMPRESSION: *Findings favor mild congestive heart failure/pulmonary edema. Electronically Signed   By: Jules Schick M.D.   On: 03/16/2023 16:11      Discharge Exam: Vitals:   03/22/23 1923 03/23/23 0357  BP:  (!) 149/72  Pulse: 78   Resp: 20 20  Temp: 98.3 F (36.8 C) 97.7 F (36.5 C)  SpO2:  96%    General: Pt is alert, awake, not in acute distress Cardiovascular: RRR, S1/S2 + Respiratory: CTA bilaterally, no wheezing, no rhonchi, no respiratory distress, no conversational dyspnea  Abdominal: Soft, NT, ND, bowel sounds + Extremities: +right lower extremity swelling, bruise, erythema  Psych: Normal mood and affect, stable judgement and insight     The results of significant diagnostics from this hospitalization (including imaging, microbiology, ancillary and laboratory) are listed below for reference.     Microbiology: Recent Results (from the past 240 hour(s))  Culture, blood (Routine X 2) w Reflex to ID Panel     Status: None (Preliminary result)   Collection Time: 03/19/23  8:34 AM   Specimen: BLOOD  Result Value Ref Range Status   Specimen Description BLOOD BLOOD LEFT HAND  Final   Special Requests   Final    BOTTLES DRAWN AEROBIC AND ANAEROBIC Blood Culture results may not be optimal due to an inadequate volume of blood received in culture bottles   Culture   Final    NO GROWTH 4 DAYS Performed at [facility], [address], [place], [place] [address]    Report Status PENDING  Incomplete  Culture, blood (Routine X 2) w Reflex to ID Panel     Status: None (Preliminary result)   Collection Time: 03/19/23  8:47 AM   Specimen: BLOOD  Result Value Ref Range Status   Specimen Description BLOOD BLOOD RIGHT HAND  Final   Special Requests   Final  BOTTLES DRAWN AEROBIC AND ANAEROBIC Blood Culture results may not be optimal due to an inadequate volume of blood received in culture bottles    Culture   Final    NO GROWTH 4 DAYS Performed at [facility], [address] [place], [place] [address]    Report Status PENDING  Incomplete     Labs: BNP (last 3 results) Recent Labs    10/26/22 1601 12/07/22 1541 03/09/23 1605  BNP 520.6* 864.9* 747.5*   Basic Metabolic Panel: Recent Labs  Lab 03/17/23 0352 03/18/23 0345 03/19/23 0420 03/20/23 0320 03/21/23 0457 03/22/23 0643 03/23/23 0421  NA 141   < > 141 140 135 139 136  K 3.5   < > 4.2 3.8 4.0 4.2 3.9  CL 100   < > 103 102 100 102 99  CO2 27   < > 26 29 26 27 24   GLUCOSE 104*   < > 112* 107* 106* 112* 101*  BUN 40*   < > 29* 33* 42* 53* 59*  CREATININE 1.65*   < > 1.26* 1.31* 1.57* 1.71* 1.93*  CALCIUM 9.2   < > 8.6* 8.5* 8.6* 9.0 8.8*  MG 2.5*  --   --   --   --  2.5*  --    < > = values in this interval not displayed.   Liver Function Tests: Recent Labs  Lab 03/19/23 0420  AST 19  ALT 12  ALKPHOS 57  BILITOT 1.7*  PROT 7.3  ALBUMIN 3.5   No results for input(s): "LIPASE", "AMYLASE" in the last 168 hours. No results for input(s): "AMMONIA" in the last 168 hours. CBC: Recent Labs  Lab 03/16/23 1400 03/17/23 0352 03/19/23 0420 03/20/23 0320 03/21/23 0457 03/22/23 0643 03/23/23 0421  WBC 10.3   < > 17.0* 16.2* 14.7* 11.0* 11.5*  NEUTROABS 6.0  --   --   --   --   --   --   HGB 12.1   < > 11.4* 11.0* 10.9* 10.3* 10.5*  HCT 37.3   < > 36.2 35.1* 33.6* 32.4* 33.0*  MCV 100.0   < > 100.3* 99.7 99.1 98.2 98.8  PLT 186   < > 188 200 212 218 225   < > = values in this interval not displayed.   Cardiac Enzymes: Recent Labs  Lab 03/16/23 1400  CKTOTAL 44   BNP: Invalid input(s): "POCBNP" CBG: No results for input(s): "GLUCAP" in the last 168 hours. D-Dimer No results for input(s): "DDIMER" in the last 72 hours. Hgb A1c No results for input(s): "HGBA1C" in the last 72 hours. Lipid Profile No results for input(s): "CHOL", "HDL", "LDLCALC", "TRIG", "CHOLHDL", "LDLDIRECT" in the last  72 hours. Thyroid function studies No results for input(s): "TSH", "T4TOTAL", "T3FREE", "THYROIDAB" in the last 72 hours.  Invalid input(s): "FREET3" Anemia work up No results for input(s): "VITAMINB12", "FOLATE", "FERRITIN", "TIBC", "IRON", "RETICCTPCT" in the last 72 hours. Urinalysis    Component Value Date/Time   COLORURINE YELLOW 03/19/2023 1800   APPEARANCEUR CLEAR 03/19/2023 1800   LABSPEC 1.013 03/19/2023 1800   PHURINE 5.0 03/19/2023 1800   GLUCOSEU 150 (A) 03/19/2023 1800   HGBUR NEGATIVE 03/19/2023 1800   BILIRUBINUR NEGATIVE 03/19/2023 1800   KETONESUR NEGATIVE 03/19/2023 1800   PROTEINUR NEGATIVE 03/19/2023 1800   UROBILINOGEN 0.2 04/11/2013 2216   NITRITE NEGATIVE 03/19/2023 1800   LEUKOCYTESUR NEGATIVE 03/19/2023 1800   Sepsis Labs Recent Labs  Lab 03/20/23 0320 03/21/23 0457 03/22/23 1191 03/23/23 0421  WBC 16.2* 14.7* 11.0* 11.5*   Microbiology Recent Results (from the past 240 hour(s))  Culture, blood (Routine X 2) w Reflex to ID Panel     Status: None (Preliminary result)   Collection Time: 03/19/23  8:34 AM   Specimen: BLOOD  Result Value Ref Range Status   Specimen Description BLOOD BLOOD LEFT HAND  Final   Special Requests   Final    BOTTLES DRAWN AEROBIC AND ANAEROBIC Blood Culture results may not be optimal due to an inadequate volume of blood received in culture bottles   Culture   Final    NO GROWTH 4 DAYS Performed at [facility], [address] [place], [place] [address]    Report Status PENDING  Incomplete  Culture, blood (Routine X 2) w Reflex to ID Panel     Status: None (Preliminary result)   Collection Time: 03/19/23  8:47 AM   Specimen: BLOOD  Result Value Ref Range Status   Specimen Description BLOOD BLOOD RIGHT HAND  Final   Special Requests   Final    BOTTLES DRAWN AEROBIC AND ANAEROBIC Blood Culture results may not be optimal due to an inadequate volume of blood received in culture bottles   Culture   Final    NO  GROWTH 4 DAYS Performed at [facility], [address], [place], [place] [address]    Report Status PENDING  Incomplete     Patient was seen and examined on the day of discharge and was found to be in stable condition. Time coordinating discharge: 45 minutes including assessment and coordination of care, as well as examination of the patient.   SIGNED:  Noralee Stain, DO [facility] 03/23/2023, 11:46 AM

## 2023-03-23 NOTE — Progress Notes (Signed)
RN spoke to [place] at [facility] on report

## 2023-03-23 NOTE — Plan of Care (Signed)
No complaints of pain. Discussed improving mobility.   Problem: Education: Goal: Knowledge of General Education information will improve Description: Including pain rating scale, medication(s)/side effects and non-pharmacologic comfort measures Outcome: Progressing   Problem: Health Behavior/Discharge Planning: Goal: Ability to manage health-related needs will improve Outcome: Progressing   Problem: Clinical Measurements: Goal: Ability to maintain clinical measurements within normal limits will improve Outcome: Progressing Goal: Will remain free from infection Outcome: Progressing Goal: Diagnostic test results will improve Outcome: Progressing Goal: Respiratory complications will improve Outcome: Progressing Goal: Cardiovascular complication will be avoided Outcome: Progressing   Problem: Activity: Goal: Risk for activity intolerance will decrease Outcome: Progressing   Problem: Nutrition: Goal: Adequate nutrition will be maintained Outcome: Progressing   Problem: Coping: Goal: Level of anxiety will decrease Outcome: Progressing   Problem: Elimination: Goal: Will not experience complications related to bowel motility Outcome: Progressing Goal: Will not experience complications related to urinary retention Outcome: Progressing   Problem: Pain Management: Goal: General experience of comfort will improve Outcome: Progressing   Problem: Safety: Goal: Ability to remain free from injury will improve Outcome: Progressing   Problem: Skin Integrity: Goal: Risk for impaired skin integrity will decrease Outcome: Progressing   Problem: Education: Goal: Individualized Educational Video(s) Outcome: Progressing   Problem: Cardiac: Goal: Ability to achieve and maintain adequate cardiopulmonary perfusion will improve Outcome: Progressing   Problem: Education: Goal: Knowledge of disease and its progression will improve Outcome: Progressing   Problem: Education: Goal:  Knowledge of disease or condition will improve Outcome: Progressing   Problem: Activity: Goal: Ability to tolerate increased activity will improve Outcome: Progressing

## 2023-03-23 NOTE — Care Management Important Message (Signed)
Important Message  Patient Details  Name: [name] [name] MRN: [id number] Date of Birth: 11/08/1931   Important Message Given:  Yes - Medicare IM     Sherilyn Banker 03/23/2023, 10:27 AM

## 2023-03-23 NOTE — TOC Progression Note (Signed)
Transition of Care [facility]) - Progression Note    Patient Details  Name: [name] [name] MRN: [id number] Date of Birth: 1931/05/26  [facility]) CM/SW Contact  Delilah Shan, LCSWA Phone Number: 03/23/2023, 10:19 AM  Clinical Narrative:      Patient has SNF bed at [facility] when medically ready. Insurance authorization has been approved from 12/3-12/5. CSW spoke with [facility] with [facility] who confirmed SNF bed for patient. Lynnea Ferrier confirmed patient can dc over today if medically ready. CSW informed MD. CSW will continue to follow.      Expected Discharge Plan and Services                                               Social Determinants of Health (SDOH) Interventions SDOH Screenings   Food Insecurity: No Food Insecurity (03/16/2023)  Housing: Low Risk  (03/16/2023)  Transportation Needs: No Transportation Needs (03/16/2023)  Utilities: Not At Risk (03/16/2023)  Depression (PHQ2-9): High Risk (12/17/2022)  Tobacco Use: Low Risk  (03/16/2023)    Readmission Risk Interventions     No data to display

## 2023-03-23 NOTE — Progress Notes (Addendum)
  Patient Name: [name] [name] Date of Encounter: 03/23/2023  Primary Cardiologist: Nona Dell, MD Electrophysiologist: Lewayne Bunting, MD  Interval Summary   Feeling better this AM, R leg remains painful Is not ready to discharge.    Vital Signs    Vitals:   03/22/23 1523 03/22/23 1524 03/22/23 1923 03/23/23 0357  BP: 130/62   (!) 149/72  Pulse: 74 75 78   Resp: (!) 32 (!) 27 20 20   Temp: 97.8 F (36.6 C)  98.3 F (36.8 C) 97.7 F (36.5 C)  TempSrc: Oral  Oral Oral  SpO2: 99% 98%  96%  Weight:    71.2 kg  Height:        Intake/Output Summary (Last 24 hours) at 03/23/2023 0845 Last data filed at 03/23/2023 0402 Gross per 24 hour  Intake --  Output 500 ml  Net -500 ml   Filed Weights   03/21/23 0635 03/22/23 0600 03/23/23 0357  Weight: 71.4 kg 71.5 kg 71.2 kg    Physical Exam    GEN- The patient is well appearing, alert and oriented x 3 today.   HEENT - ecchymosis to R side of face Lungs- Clear to ausculation bilaterally, normal work of breathing Cardiac- Regular rate and rhythm, no murmurs, rubs or gallops GI- soft, NT, ND, + BS Extremities- no clubbing or cyanosis. Ecchymosis to R shin with surrounding edema  Telemetry    VP at 75 (personally reviewed)  Hospital Course    [name] [name] is a 89 y.o. female with PMH of afib on tikosyn, CHB, CAD s/p CABG (1998), VT/VF s/p ICD, COPD on home Os, CKD IV, anxiety who was admitted for ICD shock while at pulmonary rehab.  Device interrogation showed appropriate HV therapy for PMVT/VF. Tikosyn was stopped on admission and she is now on amiodarone.  She suffered a subdural hematoma with mild interval worsening on 11/27 imaging.   Assessment & Plan    #) PMVT/VF s/p ICD shock  #) ICM  #) NSVT, PVCs  #) persis afib  #) CKD  #) SDH, nasal fractures  Maintaining sinus rhythm, no further ectopy Continue amiodarone 400mg  BID x 1 week, then reduce to 200mg  BID until follow-up with EP Holding OAC iso SDH and  facial fractures,  resume 2.5mg  eliquis BID on 12/11, per neuro recommendations Continue bisoprolol 5mg  daily Consider imdur    EP will sign off at this time but remains available      For questions or updates, please contact [facility] Please consult [email or web address] for contact info under Cardiology/STEMI.  Signed, Sherie Don, NP  03/23/2023, 8:45 AM   EP Attending  Patient seen and examined. Agree with the findings as noted above. The patient has made slow progress. She is anxious about discharge and tells me she is still "not ready to go". No chest pain or sob. She has been started on anti-biotics for cellulitis. On exam she has a RRR and lungs are clear. Ext with ecchymosis but erythema is better. Tele shows ventricular pacing. A/P VT - continue amiodarone 400 bid for 4 more days then 200 mg bid. We will see her back in followup. Atrial fib -she is well rate controlled.  Coags - start back in 2 weeks as per [facility].   [name] Gowda Illa Enlow,MD

## 2023-03-24 ENCOUNTER — Encounter (HOSPITAL_COMMUNITY): Payer: Self-pay | Admitting: *Deleted

## 2023-03-24 ENCOUNTER — Encounter: Payer: Self-pay | Admitting: Adult Health

## 2023-03-24 ENCOUNTER — Non-Acute Institutional Stay (SKILLED_NURSING_FACILITY): Payer: Self-pay | Admitting: Adult Health

## 2023-03-24 DIAGNOSIS — N184 Chronic kidney disease, stage 4 (severe): Secondary | ICD-10-CM

## 2023-03-24 DIAGNOSIS — S022XXS Fracture of nasal bones, sequela: Secondary | ICD-10-CM | POA: Diagnosis not present

## 2023-03-24 DIAGNOSIS — I700 Atherosclerosis of aorta: Secondary | ICD-10-CM | POA: Diagnosis not present

## 2023-03-24 DIAGNOSIS — I4901 Ventricular fibrillation: Secondary | ICD-10-CM | POA: Insufficient documentation

## 2023-03-24 DIAGNOSIS — I270 Primary pulmonary hypertension: Secondary | ICD-10-CM

## 2023-03-24 DIAGNOSIS — I482 Chronic atrial fibrillation, unspecified: Secondary | ICD-10-CM

## 2023-03-24 DIAGNOSIS — E782 Mixed hyperlipidemia: Secondary | ICD-10-CM

## 2023-03-24 DIAGNOSIS — I442 Atrioventricular block, complete: Secondary | ICD-10-CM | POA: Insufficient documentation

## 2023-03-24 DIAGNOSIS — I272 Pulmonary hypertension, unspecified: Secondary | ICD-10-CM

## 2023-03-24 DIAGNOSIS — S065XAA Traumatic subdural hemorrhage with loss of consciousness status unknown, initial encounter: Secondary | ICD-10-CM

## 2023-03-24 DIAGNOSIS — I5032 Chronic diastolic (congestive) heart failure: Secondary | ICD-10-CM

## 2023-03-24 DIAGNOSIS — L03115 Cellulitis of right lower limb: Secondary | ICD-10-CM

## 2023-03-24 DIAGNOSIS — D631 Anemia in chronic kidney disease: Secondary | ICD-10-CM

## 2023-03-24 DIAGNOSIS — R55 Syncope and collapse: Secondary | ICD-10-CM

## 2023-03-24 DIAGNOSIS — J449 Chronic obstructive pulmonary disease, unspecified: Secondary | ICD-10-CM

## 2023-03-24 DIAGNOSIS — I129 Hypertensive chronic kidney disease with stage 1 through stage 4 chronic kidney disease, or unspecified chronic kidney disease: Secondary | ICD-10-CM | POA: Insufficient documentation

## 2023-03-24 LAB — CULTURE, BLOOD (ROUTINE X 2)
Culture: NO GROWTH
Culture: NO GROWTH

## 2023-03-24 NOTE — Progress Notes (Signed)
Location:  [facility] Room Number: 143 Place of Service:  SNF (31)   CODE STATUS: full   Allergies  Allergen Reactions   Codeine Nausea And Vomiting   Fentanyl Itching    Itching, redness to scalp and neck   Keflex [Cephalexin] Itching and Rash    Chief Complaint  Patient presents with   Hospitalization Follow-up    HPI:  She is a 89 year old woman who has been hospitalized from 03-16-23 through 03-23-23. Her medical history includes: COPD; chronic respiratory failure; heart block s/p pacemaker; pulmonary hypertension; CHD; atrial fibrillation; and CKD stage 4. She presented to the ED with a scalp hematoma and epistaxis after a syncopal episode with fall.  She had been at cardiac rehab and was transferring from her walker to a chair when she had a sudden loss of consciousness and fell hitting her face and head. She regained awareness quickly and had epistaxis and frontal scalp hematoma. She did not have any chest pain. She did not feel a shock from her cardiac device. She did note that her bilateral lower extremity swelling.  She had her tikosyn stopped; amiodarone started and tritrated. She will resume eliquis on 03-31-23.  Left subdural hematoma: 3 mm with no new sites of hemorrhage identified. Will resume anticoagulation on 03-31-23.   bilateral nasal fractures: will need outpatient ENT.   She did have a NSTEMI  Right leg cellulitis: will need to complete coarse of augmentin.  She is here for short term rehab with her goal to return back home. She will continue to be followed for her chronic illnesses including:  Aortic atherosclerosis    Chronic atrial fibrillation:    Chronic diastolic (congestive) heart failure:    Benign hypertension with stage 4 chronic kidney disease:   Past Medical History:  Diagnosis Date   Anxiety    Asthma    Chronic atrial fibrillation (HCC)    Chronic back pain    Chronic diastolic heart failure (HCC)    Chronic  obstructive pulmonary disease, unspecified (HCC)    Complete heart block (HCC)    COPD (chronic obstructive pulmonary disease) (HCC)    Coronary atherosclerosis of native coronary artery    a. s/p CABG in 1998 with LIMA-LAD, SVG-Diagonal, SVG-RI-OM b. DES to PLA in 2006   DDD (degenerative disc disease), lumbar    Essential hypertension    Headache    ICD (implantable cardioverter-defibrillator) in place    Mixed hyperlipidemia    Osteoarthritis    Ventricular fibrillation (HCC) 2003   a. seen on PPM interrogation a/w syncope    Past Surgical History:  Procedure Laterality Date   CARDIAC DEFIBRILLATOR PLACEMENT     MDT dual chamber ICD   CARDIOVERSION N/A 08/09/2014   Procedure: CARDIOVERSION;  Surgeon: Thurmon Fair, MD;  Location: [facility] ENDOSCOPY;  Service: Cardiovascular;  Laterality: N/A;   CORONARY ARTERY BYPASS GRAFT     LIMA to LAD, SVG to diagonal, SVG to ramus and OM   ICD GENERATOR CHANGEOUT N/A 08/06/2021   Procedure: ICD GENERATOR CHANGEOUT;  Surgeon: Marinus Maw, MD;  Location: [facility] INVASIVE CV LAB;  Service: Cardiovascular;  Laterality: N/A;   IMPLANTABLE CARDIOVERTER DEFIBRILLATOR GENERATOR CHANGE N/A 09/25/2013   Procedure: IMPLANTABLE CARDIOVERTER DEFIBRILLATOR GENERATOR CHANGE;  Surgeon: Marinus Maw, MD;  Location: [facility] CATH LAB;  Service: Cardiovascular;  Laterality: N/A;   LEAD REVISION N/A 09/29/2013   Procedure: LEAD REVISION;  Surgeon: Duke Salvia, MD;  Location: [facility] CATH  LAB;  Service: Cardiovascular;  Laterality: N/A;   TONSILLECTOMY     YAG LASER APPLICATION Left 12/27/2012   Procedure: YAG LASER APPLICATION;  Surgeon: Loraine Leriche T. Nile Riggs, MD;  Location: [facility] ORS;  Service: Ophthalmology;  Laterality: Left;   YAG LASER APPLICATION Right 01/10/2013   Procedure: YAG LASER APPLICATION;  Surgeon: Loraine Leriche T. Nile Riggs, MD;  Location: [facility] ORS;  Service: Ophthalmology;  Laterality: Right;    Social History   Socioeconomic History   Marital status: Widowed    Spouse name: Not  on file   Number of children: 2   Years of education: Not on file   Highest education level: Not on file  Occupational History   Occupation: Retired    Comment: Copywriter, advertising: RETIRED  Tobacco Use   Smoking status: Never   Smokeless tobacco: Never  Vaping Use   Vaping status: Never Used  Substance and Sexual Activity   Alcohol use: No    Alcohol/week: 0.0 standard drinks of alcohol   Drug use: No   Sexual activity: Never  Other Topics Concern   Not on file  Social History Narrative   Not on file   Social Determinants of Health   Financial Resource Strain: Not on file  Food Insecurity: No Food Insecurity (03/16/2023)   Hunger Vital Sign    Worried About Running Out of Food in the Last Year: Never true    Ran Out of Food in the Last Year: Never true  Transportation Needs: No Transportation Needs (03/16/2023)   PRAPARE - Administrator, Civil Service (Medical): No    Lack of Transportation (Non-Medical): No  Physical Activity: Not on file  Stress: Not on file  Social Connections: Not on file  Intimate Partner Violence: Not At Risk (03/16/2023)   Humiliation, Afraid, Rape, and Kick questionnaire    Fear of Current or Ex-Partner: No    Emotionally Abused: No    Physically Abused: No    Sexually Abused: No   Family History  Problem Relation Age of Onset   Heart attack Father    Heart attack Brother       VITAL SIGNS BP (!) 108/54   Pulse 74   Temp (!) 97.4 F (36.3 C)   Resp 18   Ht 5' (1.524 m)   Wt 159 lb 3.2 oz (72.2 kg)   SpO2 93%   BMI 31.09 kg/m   Outpatient Encounter Medications as of 03/24/2023  Medication Sig   acetaminophen (TYLENOL) 325 MG tablet Take 2 tablets (650 mg total) by mouth every 6 (six) hours as needed for mild pain (or Fever >/= 101).   albuterol (VENTOLIN HFA) 108 (90 Base) MCG/ACT inhaler Inhale 2 puffs into the lungs every 6 (six) hours as needed for wheezing or shortness of breath.   amiodarone  (PACERONE) 400 MG tablet Take 1 tablet (400 mg total) by mouth 2 (two) times daily for 7 days, THEN 0.5 tablets (200 mg total) 2 (two) times daily.   amoxicillin-clavulanate (AUGMENTIN) 500-125 MG tablet Take 1 tablet by mouth every 12 (twelve) hours for 4 days.   [START ON 03/31/2023] apixaban (ELIQUIS) 2.5 MG TABS tablet Take 1 tablet (2.5 mg total) by mouth 2 (two) times daily.   atorvastatin (LIPITOR) 20 MG tablet Take 1 tablet (20 mg total) by mouth daily.   bisoprolol (ZEBETA) 5 MG tablet Take 1 tablet (5 mg total) by mouth daily.   Calcium Carbonate-Vitamin D (CALCIUM 600 + D PO)  Take 1 tablet by mouth 2 (two) times daily.   dapagliflozin propanediol (FARXIGA) 10 MG TABS tablet Take 1 tablet (10 mg total) by mouth daily before breakfast.   Fluticasone-Umeclidin-Vilant (TRELEGY ELLIPTA) 100-62.5-25 MCG/ACT AEPB Inhale 1 puff into the lungs daily.   folic acid (FOLVITE) 1 MG tablet Take 1 tablet (1 mg total) by mouth daily.   methocarbamol (ROBAXIN) 500 MG tablet Take 1 tablet (500 mg total) by mouth every 6 (six) hours as needed for muscle spasms.   Polyethyl Glycol-Propyl Glycol (SYSTANE OP) Place 1 drop into both eyes daily as needed (Dry eyes).   polyethylene glycol (MIRALAX / GLYCOLAX) 17 g packet Take 17 g by mouth daily as needed for mild constipation.   torsemide (DEMADEX) 100 MG tablet Take 1 tablet (100 mg total) by mouth daily.   triamcinolone cream (KENALOG) 0.1 % Apply 1 application. topically daily as needed (itching). (Patient not taking: Reported on 03/17/2023)   No facility-administered encounter medications on file as of 03/24/2023.     SIGNIFICANT DIAGNOSTIC EXAMS  LABS REVIEWED:   03-16-23: wbc 10.3; hgb 12.1; hct 37.3; mcv 100.0 plt 186; glucose 134; bun 45; creat 1.71; k+ 3.5; na++ 134; ca 9.2 gfr 28 03-19-23: wbc 17.0; hgb 11.4; hct 36.2; mcv 100.3 plt 188; glucose 112; bun 29; creat 1.26; k+ 4.2; na++ 141; ca 8.6 gfr 41; protein 7.5 albumin 3.5 tsh 2.759 03-22-41:  wbc 11.5; hgb 10.5; hct 33.0; mcv 98.8 plt 225; glucose 101; bun 59; creat 1.93; k+ 3.9; na++ 136; ca 8.8 gfr 24   Review of Systems  Constitutional:  Negative for malaise/fatigue.  Respiratory:  Negative for cough and shortness of breath.   Cardiovascular:  Positive for leg swelling. Negative for chest pain and palpitations.  Gastrointestinal:  Negative for abdominal pain, constipation and heartburn.  Musculoskeletal:  Negative for back pain, joint pain and myalgias.  Skin: Negative.   Neurological:  Negative for dizziness.  Psychiatric/Behavioral:  The patient is not nervous/anxious.    Physical Exam Constitutional:      General: She is not in acute distress.    Appearance: She is well-developed. She is not diaphoretic.  Neck:     Thyroid: No thyromegaly.  Cardiovascular:     Rate and Rhythm: Normal rate and regular rhythm.     Pulses: Normal pulses.     Heart sounds: Normal heart sounds.     Comments: Pacemaker/defibrillator Pulmonary:     Effort: Pulmonary effort is normal. No respiratory distress.     Breath sounds: Normal breath sounds.  Abdominal:     General: Bowel sounds are normal. There is no distension.     Palpations: Abdomen is soft.     Tenderness: There is no abdominal tenderness.  Musculoskeletal:        General: Normal range of motion.     Cervical back: Neck supple.     Right lower leg: No edema.     Left lower leg: No edema.  Lymphadenopathy:     Cervical: No cervical adenopathy.  Skin:    General: Skin is warm and dry.  Neurological:     Mental Status: She is alert and oriented to person, place, and time.  Psychiatric:        Mood and Affect: Mood normal.       ASSESSMENT/ PLAN  TODAY  Cardiogenic syncope/bilateral nasal fractures/subdural hematoma: will continue to monitor   2. Aortic atherosclerosis (ct 06-14-22): is on statin  3. Chronic atrial fibrillation: heart rate is  stable will continue zebeta 5 mg daily; amiodarone 400 mg twice for  7 days then 200 mg daily for rate control; will restart eliquis 2.5 mg twice daily on 03-31-23.   4. Chronic diastolic (congestive) heart failure: will continue torsemide 100 mg daily; farxiga 10 mg daily   5. Benign hypertension with stage 4 chronic kidney disease: b/p 108/54 will continue zebeta 5 mg daily will monitor   6. Complete heart block/ventricular fibrillation: is status post pacemaker with defibrillator; has fired one time  7. Primary pulmonary hypertension: will continue zebeta 5 mg daily   8. Obstructive pulmonary disease unspecified COPD type: will continue trelegy 100-62.5-25 mcg one puff daily; albuterol 2 puffs every 6 hours as needed  9. Anemia due to stage 4 chronic kidney disease: hgb 10.5  10. Cellulitis of right leg: will complete augmentin and will monitor her status   11. Mixed hyperlipidemia: will continue lipitor 20 mg daily   12. Chronic constipation: will continue miralax daily as needed     Synthia Innocent NP [facility]  call [phone]

## 2023-03-24 NOTE — Progress Notes (Signed)
Pulmonary Individual Treatment Plan  Patient Details  Name: [name] [name] MRN: [id number] Date of Birth: [birth year] Referring Provider:   Flowsheet Row PULMONARY REHAB OTHER RESP ORIENTATION from 12/17/2022 in [facility] CARDIAC REHABILITATION  Referring Provider Wallie Char MD       Initial Encounter Date:  Flowsheet Row PULMONARY REHAB OTHER RESP ORIENTATION from 12/17/2022 in [facility] CARDIAC REHABILITATION  Date 12/17/22       Visit Diagnosis: Pulmonary hypertension (HCC)  Patient's Home Medications on Admission:   Current Outpatient Medications:    acetaminophen (TYLENOL) 325 MG tablet, Take 2 tablets (650 mg total) by mouth every 6 (six) hours as needed for mild pain (or Fever >/= 101)., Disp: 100 tablet, Rfl: 2   albuterol (VENTOLIN HFA) 108 (90 Base) MCG/ACT inhaler, Inhale 2 puffs into the lungs every 6 (six) hours as needed for wheezing or shortness of breath., Disp: 18 g, Rfl: 2   amiodarone (PACERONE) 400 MG tablet, Take 1 tablet (400 mg total) by mouth 2 (two) times daily for 7 days, THEN 0.5 tablets (200 mg total) 2 (two) times daily., Disp: , Rfl:    amoxicillin-clavulanate (AUGMENTIN) 500-125 MG tablet, Take 1 tablet by mouth every 12 (twelve) hours for 4 days., Disp: , Rfl:    [START ON 03/31/2023] apixaban (ELIQUIS) 2.5 MG TABS tablet, Take 1 tablet (2.5 mg total) by mouth 2 (two) times daily., Disp: , Rfl:    atorvastatin (LIPITOR) 20 MG tablet, Take 1 tablet (20 mg total) by mouth daily., Disp: 90 tablet, Rfl: 3   bisoprolol (ZEBETA) 5 MG tablet, Take 1 tablet (5 mg total) by mouth daily., Disp: , Rfl:    Calcium Carbonate-Vitamin D (CALCIUM 600 + D PO), Take 1 tablet by mouth 2 (two) times daily., Disp: , Rfl:    dapagliflozin propanediol (FARXIGA) 10 MG TABS tablet, Take 1 tablet (10 mg total) by mouth daily before breakfast., Disp: 30 tablet, Rfl: 6   Fluticasone-Umeclidin-Vilant (TRELEGY ELLIPTA) 100-62.5-25 MCG/ACT AEPB, Inhale 1 puff into the lungs daily.,  Disp: 60 each, Rfl: 6   folic acid (FOLVITE) 1 MG tablet, Take 1 tablet (1 mg total) by mouth daily., Disp: 30 tablet, Rfl: 3   methocarbamol (ROBAXIN) 500 MG tablet, Take 1 tablet (500 mg total) by mouth every 6 (six) hours as needed for muscle spasms., Disp: , Rfl:    Polyethyl Glycol-Propyl Glycol (SYSTANE OP), Place 1 drop into both eyes daily as needed (Dry eyes)., Disp: , Rfl:    polyethylene glycol (MIRALAX / GLYCOLAX) 17 g packet, Take 17 g by mouth daily as needed for mild constipation., Disp: 14 each, Rfl: 0   torsemide (DEMADEX) 100 MG tablet, Take 1 tablet (100 mg total) by mouth daily., Disp: , Rfl:    triamcinolone cream (KENALOG) 0.1 %, Apply 1 application. topically daily as needed (itching). (Patient not taking: Reported on 03/17/2023), Disp: , Rfl:   Past Medical History: Past Medical History:  Diagnosis Date   Anxiety    Asthma    Chronic atrial fibrillation (HCC)    Chronic back pain    Chronic diastolic heart failure (HCC)    Chronic obstructive pulmonary disease, unspecified (HCC)    Complete heart block (HCC)    COPD (chronic obstructive pulmonary disease) (HCC)    Coronary atherosclerosis of native coronary artery    a. s/p CABG in 1998 with LIMA-LAD, SVG-Diagonal, SVG-RI-OM b. DES to PLA in 2006   DDD (degenerative disc disease), lumbar    Essential hypertension  Headache    ICD (implantable cardioverter-defibrillator) in place    Mixed hyperlipidemia    Osteoarthritis    Ventricular fibrillation (HCC) 2003   a. seen on PPM interrogation a/w syncope    Tobacco Use: Social History   Tobacco Use  Smoking Status Never  Smokeless Tobacco Never    Labs: Review Flowsheet  More data may exist      Latest Ref Rng & Units 03/08/2007 03/23/2008 08/29/2009 01/24/2013 06/14/2022  Labs for ITP Cardiac and Pulmonary Rehab  Cholestrol 0 - 200 mg/dL 865  784  696  295  -  LDL (calc) 0 - 99 mg/dL 58  56  58  56  -  HDL-C >39 mg/dL 28.4  13.2  36  31  -   Trlycerides <150 mg/dL 440  102  725  366  -  Bicarbonate 20.0 - 28.0 mmol/L - - - - 34.2   O2 Saturation % - - - - 53.0     Details            Capillary Blood Glucose: No results found for: "GLUCAP"   Pulmonary Assessment Scores:  Pulmonary Assessment Scores     Row Name 12/17/22 1456         ADL UCSD   ADL Phase Entry     SOB Score total 75     Rest 0     Walk 3     Stairs 5     Bath 3     Dress 3     Shop 4       CAT Score   CAT Score 26       mMRC Score   mMRC Score 3             UCSD: Self-administered rating of dyspnea associated with activities of daily living (ADLs) 6-point scale (0 = "not at all" to 5 = "maximal or unable to do because of breathlessness")  Scoring Scores range from 0 to 120.  Minimally important difference is 5 units  CAT: CAT can identify the health impairment of COPD patients and is better correlated with disease progression.  CAT has a scoring range of zero to 40. The CAT score is classified into four groups of low (less than 10), medium (10 - 20), high (21-30) and very high (31-40) based on the impact level of disease on health status. A CAT score over 10 suggests significant symptoms.  A worsening CAT score could be explained by an exacerbation, poor medication adherence, poor inhaler technique, or progression of COPD or comorbid conditions.  CAT MCID is 2 points  mMRC: mMRC (Modified Medical Research Council) Dyspnea Scale is used to assess the degree of baseline functional disability in patients of respiratory disease due to dyspnea. No minimal important difference is established. A decrease in score of 1 point or greater is considered a positive change.   Pulmonary Function Assessment:   Exercise Target Goals: Exercise Program Goal: Individual exercise prescription set using results from initial 6 min walk test and THRR while considering  patient's activity barriers and safety.   Exercise Prescription Goal: Initial  exercise prescription builds to 30-45 minutes a day of aerobic activity, 2-3 days per week.  Home exercise guidelines will be given to patient during program as part of exercise prescription that the participant will acknowledge.  Activity Barriers & Risk Stratification:  Activity Barriers & Cardiac Risk Stratification - 12/17/22 1524       Activity Barriers & Cardiac Risk  Stratification   Activity Barriers Deconditioning;Muscular Weakness;Back Problems;Arthritis;Shortness of Breath;Assistive Device;Balance Concerns             6 Minute Walk:  6 Minute Walk     Row Name 12/17/22 1516         6 Minute Walk   Phase Initial     Distance 244 feet     Walk Time 2.83 minutes     # of Rest Breaks 1  2:10     MPH 0.97     RPE 15     Perceived Dyspnea  3     Symptoms Yes (comment)     Comments SOB     Resting HR 63 bpm     Resting BP 92/48     Resting Oxygen Saturation  92 %     Exercise Oxygen Saturation  during 6 min walk 84 %     Max Ex. HR 87 bpm     Max Ex. BP 138/82     2 Minute Post BP 134/70       Interval HR   1 Minute HR 66     2 Minute HR 68     3 Minute HR 67     4 Minute HR 63     5 Minute HR 63     6 Minute HR 87     2 Minute Post HR 64     Interval Heart Rate? Yes       Interval Oxygen   Interval Oxygen? Yes     Baseline Oxygen Saturation % 92 %     1 Minute Oxygen Saturation % 87 %     1 Minute Liters of Oxygen 3 L  pulsed     2 Minute Oxygen Saturation % 84 %     2 Minute Liters of Oxygen 3 L     3 Minute Oxygen Saturation % 91 %     3 Minute Liters of Oxygen 3 L     4 Minute Oxygen Saturation % 92 %     4 Minute Liters of Oxygen 3 L     5 Minute Oxygen Saturation % 93 %     5 Minute Liters of Oxygen 3 L     6 Minute Oxygen Saturation % 92 %     6 Minute Liters of Oxygen 3 L     2 Minute Post Oxygen Saturation % 87 %     2 Minute Post Liters of Oxygen 3 L              Oxygen Initial Assessment:  Oxygen Initial Assessment - 12/17/22  1455       Home Oxygen   Home Oxygen Device Home Concentrator;E-Tanks    Sleep Oxygen Prescription Continuous    Liters per minute 2    Home Resting Oxygen Prescription Continuous    Liters per minute 2    Compliance with Home Oxygen Use Yes      Intervention   Short Term Goals To learn and exhibit compliance with exercise, home and travel O2 prescription;To learn and understand importance of monitoring SPO2 with pulse oximeter and demonstrate accurate use of the pulse oximeter.;To learn and understand importance of maintaining oxygen saturations>88%;To learn and demonstrate proper pursed lip breathing techniques or other breathing techniques. ;To learn and demonstrate proper use of respiratory medications    Long  Term Goals Exhibits compliance with exercise, home  and travel O2 prescription;Verbalizes importance of monitoring SPO2 with pulse  oximeter and return demonstration;Maintenance of O2 saturations>88%;Exhibits proper breathing techniques, such as pursed lip breathing or other method taught during program session;Compliance with respiratory medication;Demonstrates proper use of MDI's             Oxygen Re-Evaluation:  Oxygen Re-Evaluation     Row Name 12/24/22 1350 01/19/23 1348 02/09/23 1401         Program Oxygen Prescription   Program Oxygen Prescription -- Continuous Continuous     Liters per minute -- 3 3       Home Oxygen   Home Oxygen Device -- Home Concentrator;E-Tanks Home Concentrator;E-Tanks     Sleep Oxygen Prescription -- Continuous Continuous     Liters per minute -- 2 2     Home Exercise Oxygen Prescription -- Continuous Continuous     Liters per minute -- 3 3     Home Resting Oxygen Prescription -- Continuous Continuous     Liters per minute -- 2 2     Compliance with Home Oxygen Use -- Yes Yes       Goals/Expected Outcomes   Short Term Goals To learn and demonstrate proper pursed lip breathing techniques or other breathing techniques.  To learn and  demonstrate proper pursed lip breathing techniques or other breathing techniques.  To learn and demonstrate proper pursed lip breathing techniques or other breathing techniques. ;To learn and understand importance of maintaining oxygen saturations>88%;To learn and understand importance of monitoring SPO2 with pulse oximeter and demonstrate accurate use of the pulse oximeter.;To learn and exhibit compliance with exercise, home and travel O2 prescription;To learn and demonstrate proper use of respiratory medications     Long  Term Goals Exhibits proper breathing techniques, such as pursed lip breathing or other method taught during program session Exhibits proper breathing techniques, such as pursed lip breathing or other method taught during program session Exhibits compliance with exercise, home  and travel O2 prescription;Verbalizes importance of monitoring SPO2 with pulse oximeter and return demonstration;Exhibits proper breathing techniques, such as pursed lip breathing or other method taught during program session;Maintenance of O2 saturations>88%;Compliance with respiratory medication;Demonstrates proper use of MDI's     Comments Reviewed PLB technique with pt.  Talked about how it works and it's importance in maintaining their exercise saturations. [name] [name] stated that she does have to take breaks when walking due to her SOB. She said that since starting she has not noticed improvement in her SOB. [name] [name] is doing well in rehab. She is good about wearing her oxygen and finds it helpful.  However, she has noted that sometimes when she wakes up at night it has come off and she will put it back on.  She is doing well with her inhalers.  She is still working on her PLB and finds it helpful and tries to remember to do it.     Goals/Expected Outcomes Short: Become more profiecient at using PLB.   Long: Become independent at using PLB. Short: continue to use PLB for SOB   long term:continue to exericse for imrpovment  and compliance with oxygen Short: Conitue to work on PLB at home Long: Conitnue to work on breathing              Oxygen Discharge (Final Oxygen Re-Evaluation):  Oxygen Re-Evaluation - 02/09/23 1401       Program Oxygen Prescription   Program Oxygen Prescription Continuous    Liters per minute 3      Home Oxygen   Home Oxygen Device Home Concentrator;E-Tanks  Sleep Oxygen Prescription Continuous    Liters per minute 2    Home Exercise Oxygen Prescription Continuous    Liters per minute 3    Home Resting Oxygen Prescription Continuous    Liters per minute 2    Compliance with Home Oxygen Use Yes      Goals/Expected Outcomes   Short Term Goals To learn and demonstrate proper pursed lip breathing techniques or other breathing techniques. ;To learn and understand importance of maintaining oxygen saturations>88%;To learn and understand importance of monitoring SPO2 with pulse oximeter and demonstrate accurate use of the pulse oximeter.;To learn and exhibit compliance with exercise, home and travel O2 prescription;To learn and demonstrate proper use of respiratory medications    Long  Term Goals Exhibits compliance with exercise, home  and travel O2 prescription;Verbalizes importance of monitoring SPO2 with pulse oximeter and return demonstration;Exhibits proper breathing techniques, such as pursed lip breathing or other method taught during program session;Maintenance of O2 saturations>88%;Compliance with respiratory medication;Demonstrates proper use of MDI's    Comments [name] [name] is doing well in rehab. She is good about wearing her oxygen and finds it helpful.  However, she has noted that sometimes when she wakes up at night it has come off and she will put it back on.  She is doing well with her inhalers.  She is still working on her PLB and finds it helpful and tries to remember to do it.    Goals/Expected Outcomes Short: Conitue to work on PLB at home Long: Conitnue to work on breathing              Initial Exercise Prescription:  Initial Exercise Prescription - 12/17/22 1500       Date of Initial Exercise RX and Referring Provider   Date 12/17/22    Referring Provider Wallie Char MD      Oxygen   Oxygen Continuous    Liters 3    Maintain Oxygen Saturation 88% or higher      NuStep   Level 1    SPM 80    Minutes 30    METs 1.5      Prescription Details   Frequency (times per week) 2    Duration Progress to 30 minutes of continuous aerobic without signs/symptoms of physical distress      Intensity   THRR 40-80% of Max Heartrate 90-118    Ratings of Perceived Exertion 11-13    Perceived Dyspnea 0-4      Progression   Progression Continue to progress workloads to maintain intensity without signs/symptoms of physical distress.      Resistance Training   Training Prescription Yes    Weight 2 lb    Reps 10-15             Perform Capillary Blood Glucose checks as needed.  Exercise Prescription Changes:   Exercise Prescription Changes     Row Name 12/17/22 1500 12/24/22 1400 01/19/23 1300 02/09/23 1400 02/18/23 1500     Response to Exercise   Blood Pressure (Admit) 92/48 106/50 92/60 -- 120/60   Blood Pressure (Exercise) 134/82 110/60 -- -- --   Blood Pressure (Exit) 134/70 106/62 124/64 -- 102/66   Heart Rate (Admit) 63 bpm 66 bpm 68 bpm -- 64 bpm   Heart Rate (Exercise) 87 bpm 69 bpm 98 bpm -- 64 bpm   Heart Rate (Exit) 64 bpm 65 bpm 91 bpm -- 61 bpm   Oxygen Saturation (Admit) 92 % 94 % 94 % -- 98 %  Oxygen Saturation (Exercise) 84 % 95 % 88 % -- 95 %   Oxygen Saturation (Exit) 87 % 98 % 98 % -- 100 %   Rating of Perceived Exertion (Exercise) 15 13 12  -- 12   Perceived Dyspnea (Exercise) 3 1 2  -- 3   Symptoms SOB, fatigue -- -- -- --   Comments walk test results -- -- -- --   Duration -- Continue with 30 min of aerobic exercise without signs/symptoms of physical distress. Continue with 30 min of aerobic exercise without  signs/symptoms of physical distress. -- Continue with 30 min of aerobic exercise without signs/symptoms of physical distress.   Intensity -- THRR unchanged THRR unchanged -- THRR unchanged     Progression   Progression -- Continue to progress workloads to maintain intensity without signs/symptoms of physical distress. Continue to progress workloads to maintain intensity without signs/symptoms of physical distress. -- Continue to progress workloads to maintain intensity without signs/symptoms of physical distress.     Resistance Training   Training Prescription -- Yes Yes -- Yes   Weight -- 2 2 lbs / yellow band -- 2 lbs / yellow   Reps -- 10-15 10-15 -- 10-15     Oxygen   Oxygen -- Continuous Continuous -- Continuous   Liters -- 3 3 -- 3     NuStep   Level -- 1 2 -- 1   SPM -- 48 49 -- 56   Minutes -- 30 30 -- 30   METs -- 1.6 1.6 -- 1.7     Home Exercise Plan   Plans to continue exercise at -- -- -- Home (comment)  walking Home (comment)   Frequency -- -- -- Add 2 additional days to program exercise sessions. Add 2 additional days to program exercise sessions.   Initial Home Exercises Provided -- -- -- 02/09/23 --     Oxygen   Maintain Oxygen Saturation -- 88% or higher 88% or higher -- 88% or higher    Row Name 03/02/23 1500             Response to Exercise   Blood Pressure (Admit) 122/62       Blood Pressure (Exit) 122/62       Heart Rate (Admit) 63 bpm       Heart Rate (Exercise) 70 bpm       Heart Rate (Exit) 61 bpm       Oxygen Saturation (Admit) 95 %       Oxygen Saturation (Exercise) 90 %       Oxygen Saturation (Exit) 91 %       Rating of Perceived Exertion (Exercise) 13       Perceived Dyspnea (Exercise) 2       Duration Continue with 30 min of aerobic exercise without signs/symptoms of physical distress.       Intensity THRR unchanged         Progression   Progression Continue to progress workloads to maintain intensity without signs/symptoms of physical  distress.         Resistance Training   Training Prescription Yes       Weight 2 lbs       Reps 10-15         Oxygen   Oxygen Continuous       Liters 2         NuStep   Level 2       SPM 53       Minutes 30  METs 2.15         Home Exercise Plan   Plans to continue exercise at Home (comment)       Frequency Add 2 additional days to program exercise sessions.         Oxygen   Maintain Oxygen Saturation 88% or higher                Exercise Comments:   Exercise Comments     Row Name 12/24/22 1349           Exercise Comments First full day of exercise!  Patient was oriented to gym and equipment including functions, settings, policies, and procedures.  Patient's individual exercise prescription and treatment plan were reviewed.  All starting workloads were established based on the results of the 6 minute walk test done at initial orientation visit.  The plan for exercise progression was also introduced and progression will be customized based on patient's performance and goals.                Exercise Goals and Review:   Exercise Goals     Row Name 12/17/22 1526             Exercise Goals   Increase Physical Activity Yes       Intervention Provide advice, education, support and counseling about physical activity/exercise needs.;Develop an individualized exercise prescription for aerobic and resistive training based on initial evaluation findings, risk stratification, comorbidities and participant's personal goals.       Expected Outcomes Short Term: Attend rehab on a regular basis to increase amount of physical activity.;Long Term: Exercising regularly at least 3-5 days a week.;Long Term: Add in home exercise to make exercise part of routine and to increase amount of physical activity.       Increase Strength and Stamina Yes       Intervention Provide advice, education, support and counseling about physical activity/exercise needs.;Develop an  individualized exercise prescription for aerobic and resistive training based on initial evaluation findings, risk stratification, comorbidities and participant's personal goals.       Expected Outcomes Short Term: Increase workloads from initial exercise prescription for resistance, speed, and METs.;Short Term: Perform resistance training exercises routinely during rehab and add in resistance training at home;Long Term: Improve cardiorespiratory fitness, muscular endurance and strength as measured by increased METs and functional capacity ( )       Able to understand and use rate of perceived exertion (RPE) scale Yes       Intervention Provide education and explanation on how to use RPE scale       Expected Outcomes Short Term: Able to use RPE daily in rehab to express subjective intensity level;Long Term:  Able to use RPE to guide intensity level when exercising independently       Able to understand and use Dyspnea scale Yes       Intervention Provide education and explanation on how to use Dyspnea scale       Expected Outcomes Short Term: Able to use Dyspnea scale daily in rehab to express subjective sense of shortness of breath during exertion;Long Term: Able to use Dyspnea scale to guide intensity level when exercising independently       Knowledge and understanding of Target Heart Rate Range (THRR) Yes       Intervention Provide education and explanation of THRR including how the numbers were predicted and where they are located for reference       Expected Outcomes Short Term:  Able to state/look up THRR;Short Term: Able to use daily as guideline for intensity in rehab;Long Term: Able to use THRR to govern intensity when exercising independently       Able to check pulse independently Yes       Intervention Provide education and demonstration on how to check pulse in carotid and radial arteries.;Review the importance of being able to check your own pulse for safety during independent exercise        Expected Outcomes Short Term: Able to explain why pulse checking is important during independent exercise;Long Term: Able to check pulse independently and accurately       Understanding of Exercise Prescription Yes       Intervention Provide education, explanation, and written materials on patient's individual exercise prescription       Expected Outcomes Short Term: Able to explain program exercise prescription;Long Term: Able to explain home exercise prescription to exercise independently                Exercise Goals Re-Evaluation :  Exercise Goals Re-Evaluation     Row Name 12/24/22 1349 01/19/23 1331 01/20/23 1327 02/09/23 1353 03/03/23 1015     Exercise Goal Re-Evaluation   Exercise Goals Review Able to understand and use rate of perceived exertion (RPE) scale;Knowledge and understanding of Target Heart Rate Range (THRR);Understanding of Exercise Prescription;Able to understand and use Dyspnea scale Increase Physical Activity;Increase Strength and Stamina Increase Physical Activity;Increase Strength and Stamina;Understanding of Exercise Prescription Increase Physical Activity;Increase Strength and Stamina;Understanding of Exercise Prescription Increase Physical Activity;Able to understand and use rate of perceived exertion (RPE) scale;Understanding of Exercise Prescription   Comments Reviewed RPE  and dyspnea scale, THR and program prescription with pt today.  Pt voiced understanding and was given a copy of goals to take home. [place] has been doing well in rehab. At the start she did not think she was going to be able to do rehab but she is proud that she has been able to do the NuStep. She said that she has not noticed an increase in her energy level or SOB. She continues to do well on the nustep and is on level 2. [name] [name] is doing great in rehab and tolerating exercise well. She has increased her level on the NuStep to level 2. She is exercising at an RPE of 12. Will continue to monitor  and progress as able. [name] [name] is doing well in rehab.  She has not noticed much improvement in her breathing but she does have more stamina.  She is walking some at home already.  She wants to get better.  We have also noticed that she no longer needs as many rest breaks in rehab. Reviewed home exercise with pt today.  Pt plans to walk at home for exercise.  Reviewed THR, pulse, RPE, sign and symptoms, pulse oximetery and when to call 911 or MD.  Also discussed weather considerations and indoor options.  Pt voiced understanding. --   Expected Outcomes Short: Use RPE daily to regulate intensity.  Long: Follow program prescription in THR. Short: continue to increase levels when able    long term: continue to attend rehab Short: increase to level 3 for one half in the next two weeks    long term: continue to monitor and progress as able, Short: Continue to add exercise at home Long: continue to improve stamina --    Row Name 03/03/23 1016             Exercise  Goal Re-Evaluation   Exercise Goals Review Increase Physical Activity;Able to understand and use rate of perceived exertion (RPE) scale;Understanding of Exercise Prescription       Comments [name] [name] is doing well in rehab. She is deconditioned but has seen improvment in her stamina. Shhe is on level 2 on the nustep. Will continue tp montior and progress as able       Expected Outcomes short : increase to level 3 in the next two weeks   long term: continue to attend rehab.                Discharge Exercise Prescription (Final Exercise Prescription Changes):  Exercise Prescription Changes - 03/02/23 1500       Response to Exercise   Blood Pressure (Admit) 122/62    Blood Pressure (Exit) 122/62    Heart Rate (Admit) 63 bpm    Heart Rate (Exercise) 70 bpm    Heart Rate (Exit) 61 bpm    Oxygen Saturation (Admit) 95 %    Oxygen Saturation (Exercise) 90 %    Oxygen Saturation (Exit) 91 %    Rating of Perceived Exertion (Exercise) 13     Perceived Dyspnea (Exercise) 2    Duration Continue with 30 min of aerobic exercise without signs/symptoms of physical distress.    Intensity THRR unchanged      Progression   Progression Continue to progress workloads to maintain intensity without signs/symptoms of physical distress.      Resistance Training   Training Prescription Yes    Weight 2 lbs    Reps 10-15      Oxygen   Oxygen Continuous    Liters 2      NuStep   Level 2    SPM 53    Minutes 30    METs 2.15      Home Exercise Plan   Plans to continue exercise at Home (comment)    Frequency Add 2 additional days to program exercise sessions.      Oxygen   Maintain Oxygen Saturation 88% or higher             Nutrition:  Target Goals: Understanding of nutrition guidelines, daily intake of sodium 1500mg , cholesterol 200mg , calories 30% from fat and 7% or less from saturated fats, daily to have 5 or more servings of fruits and vegetables.  Biometrics:  Pre Biometrics - 12/17/22 1526       Pre Biometrics   Height 5' (1.524 m)    Weight 156 lb 8 oz (71 kg)    Waist Circumference 39 inches    Hip Circumference 42.5 inches    Waist to Hip Ratio 0.92 %    BMI (Calculated) 30.56    Grip Strength 14.6 kg              Nutrition Therapy Plan and Nutrition Goals:   Nutrition Assessments:  MEDIFICTS Score Key: >=70 Need to make dietary changes  40-70 Heart Healthy Diet <= 40 Therapeutic Level Cholesterol Diet  Flowsheet Row PULMONARY REHAB OTHER RESP ORIENTATION from 12/17/2022 in [facility] CARDIAC REHABILITATION  Picture Your Plate Total Score on Admission 28      Picture Your Plate Scores: <16 Unhealthy dietary pattern with much room for improvement. 41-50 Dietary pattern unlikely to meet recommendations for good health and room for improvement. 51-60 More healthful dietary pattern, with some room for improvement.  >60 Healthy dietary pattern, although there may be some specific behaviors  that could be improved.  Nutrition Goals Re-Evaluation:  Nutrition Goals Re-Evaluation     Row Name 01/19/23 1342 02/09/23 1357           Goals   Nutrition Goal healthy eatting healthy eatting      Comment [name] [name] said that she does not have a big appite. She is at [facility] so she eats their dinning food. She said they have a lot of veggies and chicken. They give her a lot of food and she isnt able to eat it all. She also said that it is sometimes to salty and she cant eat things too salty. She does not eat a lot of sweets but will eat the ones the [facility] has with dinner. [name] [name] is doing well in rehab. She is still not a big eater.  She uses dining hall food at [facility] so not able to control all her food.  She does try to avoid things that are too salty and get in fruits and vegetbales.      Expected Outcome Short : coninue to eat adiquet amout of foods    long term: continue to choise healthy eating Short: Continue to eat enough Long: Continue to pick healthy options               Nutrition Goals Discharge (Final Nutrition Goals Re-Evaluation):  Nutrition Goals Re-Evaluation - 02/09/23 1357       Goals   Nutrition Goal healthy eatting    Comment [name] [name] is doing well in rehab. She is still not a big eater.  She uses dining hall food at [facility] so not able to control all her food.  She does try to avoid things that are too salty and get in fruits and vegetbales.    Expected Outcome Short: Continue to eat enough Long: Continue to pick healthy options             Psychosocial: Target Goals: Acknowledge presence or absence of significant depression and/or stress, maximize coping skills, provide positive support system. Participant is able to verbalize types and ability to use techniques and skills needed for reducing stress and depression.  Initial Review & Psychosocial Screening:  Initial Psych Review & Screening - 12/17/22 1500       Initial Review   Current issues  with Current Depression;Current Stress Concerns;Current Sleep Concerns    Source of Stress Concerns Unable to participate in former interests or hobbies;Chronic Illness      Family Dynamics   Good Support System? Yes   Patient's son and daughter are her support people.     Barriers   Psychosocial barriers to participate in program The patient should benefit from training in stress management and relaxation.      Screening Interventions   Interventions Encouraged to exercise;To provide support and resources with identified psychosocial needs;Provide feedback about the scores to participant    Expected Outcomes Short Term goal: Utilizing psychosocial counselor, staff and physician to assist with identification of specific Stressors or current issues interfering with healing process. Setting desired goal for each stressor or current issue identified.;Long Term Goal: Stressors or current issues are controlled or eliminated.;Short Term goal: Identification and review with participant of any Quality of Life or Depression concerns found by scoring the questionnaire.;Long Term goal: The participant improves quality of Life and PHQ9 Scores as seen by post scores and/or verbalization of changes             Quality of Life Scores:  Scores of 19 and below usually indicate a poorer  quality of life in these areas.  A difference of  2-3 points is a clinically meaningful difference.  A difference of 2-3 points in the total score of the Quality of Life Index has been associated with significant improvement in overall quality of life, self-image, physical symptoms, and general health in studies assessing change in quality of life.   PHQ-9: Review Flowsheet       12/17/2022  Depression screen PHQ 2/9  Decreased Interest 3  Down, Depressed, Hopeless 1  PHQ - 2 Score 4  Altered sleeping 3  Tired, decreased energy 3  Change in appetite 2  Feeling bad or failure about yourself  1  Trouble concentrating  3  Moving slowly or fidgety/restless 0  Suicidal thoughts 0  PHQ-9 Score 16  Difficult doing work/chores Somewhat difficult    Details           Interpretation of Total Score  Total Score Depression Severity:  1-4 = Minimal depression, 5-9 = Mild depression, 10-14 = Moderate depression, 15-19 = Moderately severe depression, 20-27 = Severe depression   Psychosocial Evaluation and Intervention:  Psychosocial Evaluation - 12/17/22 1502       Psychosocial Evaluation & Interventions   Interventions Stress management education;Relaxation education;Encouraged to exercise with the program and follow exercise prescription    Comments Patient was referred to PR with pulmonary HTN. She was accompained today by her son and daughter. Her PHQ-9 score was 16 due to sleeping issues caused by nocturia, lack of interest in doing things, poor appepite and lack of energy. She was hospitalized in April with HF exacerbation and was discharged to SNF and then went to an assistive living facility in June. She says she has been down and depressed since moving into [facility]. She wants to go back to her home and get back to living independently. She has never been treated for depression or anxiety and says she is not willing to be treated at this time. 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 She is hard of hearting but very alert and orientated. She was discharged from the hospital on O2. She says she has tried outpatient PT and was not able to complete the sessions due to SOB and fatigue so she is very skeptical that she will be able to participate in PR but is willing to try since her son and daughter want her to try. They both say she was not using O2 when she attempted PT and they feel she will be able to participate. They will provide transportation or transportation will be provided by [facility]. Her son and daughter say her diuretic was increased  at her last visit to the HF clinic and since this she has been  staying in her room not going to activities or to the dining room to eat like she was doing previously. They think this is mainly due to fear of her having to urinate and soiling herself. The patient says she has to go to the bathroom every 15 minutes and this is why she does not leave her room. She does wear a pull-up in case she can't make it to the bathroom. She says her goals for the program are to breathe better and feel better overall. Her lack of motivation could be a barrier for her to participate in the program.    Expected Outcomes Short term: Attend sessions. Long Term: Breathe better and feel better overall.    Continue Psychosocial Services  Follow up required by staff  Psychosocial Re-Evaluation:  Psychosocial Re-Evaluation     Row Name 01/19/23 1334 02/09/23 1354           Psychosocial Re-Evaluation   Current issues with History of Depression;Current Stress Concerns History of Depression;Current Stress Concerns;Current Sleep Concerns      Comments [name] [name] stated that she is down about still living at [facility]. She is wanting to get back to living at her own home. She stated that her goals is to get home. She said that she did live by helself. She also stated that [facility] has been a nice place and everyone there has been treating her nicely. She is attending resident event Yaritzi is doing well in rehab.  She is still at [facility] and really wants to get back home.  They have not told her when she will be able to go home.  She knows she has to build up her independence again.  Her kids do not want her home by herself and she even admits that it would be hard for her to that still.  She still is not sleeping well with frequent trips to the bathroom.  She tries to rest as best as she can.      Expected Outcomes Short term: continue to attned resident events and get to know people    long term: continue to attned rehab and exercise to get to personal goals Short: Continue to  work to build up independence again Long: Continue to exercise for mental boost and talk with kids.      Interventions Stress management education;Relaxation education --      Continue Psychosocial Services  Follow up required by staff --               Psychosocial Discharge (Final Psychosocial Re-Evaluation):  Psychosocial Re-Evaluation - 02/09/23 1354       Psychosocial Re-Evaluation   Current issues with History of Depression;Current Stress Concerns;Current Sleep Concerns    Comments [name] [name] is doing well in rehab.  She is still at [facility] and really wants to get back home.  They have not told her when she will be able to go home.  She knows she has to build up her independence again.  Her kids do not want her home by herself and she even admits that it would be hard for her to that still.  She still is not sleeping well with frequent trips to the bathroom.  She tries to rest as best as she can.    Expected Outcomes Short: Continue to work to build up independence again Long: Continue to exercise for mental boost and talk with kids.              Education: Education Goals: Education classes will be provided on a weekly basis, covering required topics. Participant will state understanding/return demonstration of topics presented.  Learning Barriers/Preferences:  Learning Barriers/Preferences - 12/17/22 1458       Learning Barriers/Preferences   Learning Barriers Hearing    Learning Preferences Written Material;Audio;Skilled Demonstration             Education Topics: How Lungs Work and Diseases: - Discuss the anatomy of the lungs and diseases that can affect the lungs, such as COPD.   Exercise: -Discuss the importance of exercise, FITT principles of exercise, normal and abnormal responses to exercise, and how to exercise safely.   Environmental Irritants: -Discuss types of environmental irritants and how to limit exposure to environmental  irritants.   Meds/Inhalers and oxygen: -  Discuss respiratory medications, definition of an inhaler and oxygen, and the proper way to use an inhaler and oxygen.   Energy Saving Techniques: - Discuss methods to conserve energy and decrease shortness of breath when performing activities of daily living.    Bronchial Hygiene / Breathing Techniques: - Discuss breathing mechanics, pursed-lip breathing technique,  proper posture, effective ways to clear airways, and other functional breathing techniques   Cleaning Equipment: - Provides group verbal and written instruction about the health risks of elevated stress, cause of high stress, and healthy ways to reduce stress.   Nutrition I: Fats: - Discuss the types of cholesterol, what cholesterol does to the body, and how cholesterol levels can be controlled.   Nutrition II: Labels: -Discuss the different components of food labels and how to read food labels.   Respiratory Infections: - Discuss the signs and symptoms of respiratory infections, ways to prevent respiratory infections, and the importance of seeking medical treatment when having a respiratory infection.   Stress I: Signs and Symptoms: - Discuss the causes of stress, how stress may lead to anxiety and depression, and ways to limit stress. Flowsheet Row PULMONARY REHAB OTHER RESPIRATORY from 02/25/2023 in [facility] CARDIAC REHABILITATION  Date 02/11/23  Educator hj  Instruction Review Code 1- Verbalizes Understanding       Stress II: Relaxation: -Discuss relaxation techniques to limit stress.   Oxygen for Home/Travel: - Discuss how to prepare for travel when on oxygen and proper ways to transport and store oxygen to ensure safety.   Knowledge Questionnaire Score:  Knowledge Questionnaire Score - 12/17/22 1458       Knowledge Questionnaire Score   Pre Score 16/18             Core Components/Risk Factors/Patient Goals at Admission:  Personal Goals and Risk  Factors at Admission - 12/17/22 1459       Core Components/Risk Factors/Patient Goals on Admission    Weight Management Yes;Obesity;Weight Loss    Intervention Weight Management: Develop a combined nutrition and exercise program designed to reach desired caloric intake, while maintaining appropriate intake of nutrient and fiber, sodium and fats, and appropriate energy expenditure required for the weight goal.;Weight Management: Provide education and appropriate resources to help participant work on and attain dietary goals.;Weight Management/Obesity: Establish reasonable short term and long term weight goals.;Obesity: Provide education and appropriate resources to help participant work on and attain dietary goals.    Admit Weight 156 lb 8 oz (71 kg)    Goal Weight: Short Term 150 lb (68 kg)    Goal Weight: Long Term 150 lb (68 kg)    Expected Outcomes Short Term: Continue to assess and modify interventions until short term weight is achieved;Long Term: Adherence to nutrition and physical activity/exercise program aimed toward attainment of established weight goal;Weight Loss: Understanding of general recommendations for a balanced deficit meal plan, which promotes 1-2 lb weight loss per week and includes a negative energy balance of [phone] kcal/d;Understanding recommendations for meals to include 15-35% energy as protein, 25-35% energy from fat, 35-60% energy from carbohydrates, less than 200mg  of dietary cholesterol, 20-35 gm of total fiber daily;Understanding of distribution of calorie intake throughout the day with the consumption of 4-5 meals/snacks    Improve shortness of breath with ADL's Yes    Intervention Provide education, individualized exercise plan and daily activity instruction to help decrease symptoms of SOB with activities of daily living.    Expected Outcomes Short Term: Improve cardiorespiratory fitness to achieve a  reduction of symptoms when performing ADLs;Long Term: Be able to  perform more ADLs without symptoms or delay the onset of symptoms    Increase knowledge of respiratory medications and ability to use respiratory devices properly  Yes    Intervention Provide education and demonstration as needed of appropriate use of medications, inhalers, and oxygen therapy.    Expected Outcomes Short Term: Achieves understanding of medications use. Understands that oxygen is a medication prescribed by physician. Demonstrates appropriate use of inhaler and oxygen therapy.;Long Term: Maintain appropriate use of medications, inhalers, and oxygen therapy.    Heart Failure Yes    Intervention Provide a combined exercise and nutrition program that is supplemented with education, support and counseling about heart failure. Directed toward relieving symptoms such as shortness of breath, decreased exercise tolerance, and extremity edema.    Expected Outcomes Improve functional capacity of life;Short term: Attendance in program 2-3 days a week with increased exercise capacity. Reported lower sodium intake. Reported increased fruit and vegetable intake. Reports medication compliance.;Short term: Daily weights obtained and reported for increase. Utilizing diuretic protocols set by physician.;Long term: Adoption of self-care skills and reduction of barriers for early signs and symptoms recognition and intervention leading to self-care maintenance.    Hypertension Yes    Intervention Monitor prescription use compliance.;Provide education on lifestyle modifcations including regular physical activity/exercise, weight management, moderate sodium restriction and increased consumption of fresh fruit, vegetables, and low fat dairy, alcohol moderation, and smoking cessation.    Expected Outcomes Short Term: Continued assessment and intervention until BP is < 140/73mm HG in hypertensive participants. < 130/47mm HG in hypertensive participants with diabetes, heart failure or chronic kidney disease.;Long Term:  Maintenance of blood pressure at goal levels.    Lipids Yes    Intervention Provide education and support for participant on nutrition & aerobic/resistive exercise along with prescribed medications to achieve LDL 70mg , HDL >40mg .    Expected Outcomes Short Term: Participant states understanding of desired cholesterol values and is compliant with medications prescribed. Participant is following exercise prescription and nutrition guidelines.;Long Term: Cholesterol controlled with medications as prescribed, with individualized exercise RX and with personalized nutrition plan. Value goals: LDL < 70mg , HDL > 40 mg.             Core Components/Risk Factors/Patient Goals Review:   Goals and Risk Factor Review     Row Name 01/19/23 1346 02/09/23 1358           Core Components/Risk Factors/Patient Goals Review   Personal Goals Review Improve shortness of breath with ADL's;Weight Management/Obesity Improve shortness of breath with ADL's;Weight Management/Obesity;Heart Failure;Hypertension;Increase knowledge of respiratory medications and ability to use respiratory devices properly.      Review [name] [name] has been working on her weight managment but she does not eat much due to low appite. She does choose to eat healthy options at the living center. She is wanting to imrpvoe her SOB but has not seen an improvemnt yet. She does walk around [place] with help and attend classes to increase her endurance. She is also remebering to use PLB to help with her SOB when walking. [name] [name] is doing well in rehab.  She keeps a close eye on her weight and it is staying steady for most part.  She does note that she still is retaining some fluid in her legs.  They have improved some.  She does not want more lasix as she is already constantly going to bathroom.  Her breathing is still not too much  better but feels that she can breathe okay.  Her pressures are still running low for her and she talked to Dr. Diona Browner last week  about it.  He told her is she gets symptomatic with it that he would lower her medications.  She wants to get better.      Expected Outcomes Short term: continue to use PLB for SOB   long term: continue to exericse for mantaince and improving endurance Short: Continue to keep close eye on blood pressures Long: Continue to monitor heart failure symptoms               Core Components/Risk Factors/Patient Goals at Discharge (Final Review):   Goals and Risk Factor Review - 02/09/23 1358       Core Components/Risk Factors/Patient Goals Review   Personal Goals Review Improve shortness of breath with ADL's;Weight Management/Obesity;Heart Failure;Hypertension;Increase knowledge of respiratory medications and ability to use respiratory devices properly.    Review [name] [name] is doing well in rehab.  She keeps a close eye on her weight and it is staying steady for most part.  She does note that she still is retaining some fluid in her legs.  They have improved some.  She does not want more lasix as she is already constantly going to bathroom.  Her breathing is still not too much better but feels that she can breathe okay.  Her pressures are still running low for her and she talked to Dr. Diona Browner last week about it.  He told her is she gets symptomatic with it that he would lower her medications.  She wants to get better.    Expected Outcomes Short: Continue to keep close eye on blood pressures Long: Continue to monitor heart failure symptoms             ITP Comments:  ITP Comments     Row Name 12/24/22 1349 12/30/22 1518 01/27/23 1633 02/24/23 0819 03/16/23 1350   ITP Comments First full day of exercise!  Patient was oriented to gym and equipment including functions, settings, policies, and procedures.  Patient's individual exercise prescription and treatment plan were reviewed.  All starting workloads were established based on the results of the 6 minute walk test done at initial orientation visit.   The plan for exercise progression was also introduced and progression will be customized based on patient's performance and goals. 30 day review completed. ITP sent to Dr.Jehanzeb Memon, Medical Director of  Pulmonary Rehab. Continue with ITP unless changes are made by physician.  New to program 30 day review completed. ITP sent to Dr.Jehanzeb Memon, Medical Director of  Pulmonary Rehab. Continue with ITP unless changes are made by physician. 30 day review completed. ITP sent to Dr.Jehanzeb Memon, Medical Director of  Pulmonary Rehab. Continue with ITP unless changes are made by physician. [name] [name] did not complete her rehab session.  [name] [name] was moving from her walker to her chair and passed out down to floor.  She hit her head and nose.  [employer] was called, she had lost consciousness but never stopped breathing or loss of pulse noticed.  She came back to and attempted to get up on own.  Staff assisted as [employer] arrived.  Team took over and transported to ED for CT of head.    Row Name 03/24/23 1152 03/24/23 1157         ITP Comments [name] [name] was admitted to hospital after episode was found to be a vfib arrest corrected by  ICD. Her fall resulted in a nasal fracture and subdermal hematoma on head.  She has been discharged to SNF [facility]).  Will continue to follow. 30 day review completed. ITP sent to Dr.Jehanzeb Memon, Medical Director of  Pulmonary Rehab. Continue with ITP unless changes are made by physician.               Comments: 30 day review

## 2023-03-25 ENCOUNTER — Encounter: Payer: Self-pay | Admitting: Internal Medicine

## 2023-03-25 ENCOUNTER — Encounter (HOSPITAL_COMMUNITY): Payer: Medicare PPO

## 2023-03-25 ENCOUNTER — Non-Acute Institutional Stay (SKILLED_NURSING_FACILITY): Payer: Medicare PPO | Admitting: Internal Medicine

## 2023-03-25 DIAGNOSIS — D631 Anemia in chronic kidney disease: Secondary | ICD-10-CM

## 2023-03-25 DIAGNOSIS — R55 Syncope and collapse: Secondary | ICD-10-CM | POA: Diagnosis not present

## 2023-03-25 DIAGNOSIS — N184 Chronic kidney disease, stage 4 (severe): Secondary | ICD-10-CM | POA: Diagnosis not present

## 2023-03-25 DIAGNOSIS — I129 Hypertensive chronic kidney disease with stage 1 through stage 4 chronic kidney disease, or unspecified chronic kidney disease: Secondary | ICD-10-CM

## 2023-03-25 DIAGNOSIS — S022XXD Fracture of nasal bones, subsequent encounter for fracture with routine healing: Secondary | ICD-10-CM

## 2023-03-25 DIAGNOSIS — L03115 Cellulitis of right lower limb: Secondary | ICD-10-CM | POA: Diagnosis not present

## 2023-03-25 DIAGNOSIS — S065XAA Traumatic subdural hemorrhage with loss of consciousness status unknown, initial encounter: Secondary | ICD-10-CM

## 2023-03-25 NOTE — Patient Instructions (Signed)
See assessment and plan under each diagnosis in the problem list and acutely for this visit 

## 2023-03-25 NOTE — Progress Notes (Unsigned)
NURSING HOME LOCATION:  [facility] ROOM NUMBER: [id number] P  CODE STATUS:  Full Code  PCP:  [name] MD  This is a comprehensive admission note to this SNFperformed on this date less than 30 days from date of admission. Included are preadmission medical/surgical history; reconciled medication list; family history; social history and comprehensive review of systems.  Corrections and additions to the records were documented. Comprehensive physical exam was also performed. Additionally a clinical summary was entered for each active diagnosis pertinent to this admission in the Problem List to enhance continuity of care.  HPI: She was hospitalized 11/26 - 03/23/2023, admitted after acute syncopal episode while in Pulmonary Rehab.  She struck her face and head resulting in epistaxis and frontal scalp hematoma. Imaging revealed a left subdural hematoma which was monitored with serial CTs without significant progression.  Neurosurgery was consulted; no surgical intervention was recommended.  Eliquis was held with resumption to be scheduled 12/11 if there was no progression of  bleeding. Imaging also revealed bilateral nasal fractures.  Once there was significant improvement in swelling; outpatient ENT evaluation was to be scheduled. Interrogation of the ICD revealed V-fib with torsades de points from which she had been defibrillated.  Troponin peaked at 243 attributed to non-STEMI.  Cardiology consulted; Tikosyn was discontinued and she was placed on amiodarone 400 mg twice daily for 1 week with subsequent decrease to 200 mg twice daily until follow-up.   BNP was 747.5; torsemide and bisoprolol were continued for her chronic diastolic congestive heart failure and pulmonary hypertension. Leukocytosis was present without evidence of active infection other than  possible right lower extremity cellulitis for which 5 days of Augmentin was prescribed.  Peak white count was 17,000 with a final  value of 11,500.  Blood cultures were negative.   CKD stage IV was present with creatinines ranging from 1.71 up to 1.93;GFR ranged from 24-28.  Possible prerenal component was suggested by a BUN of 59. Serially there was a drop in her H/H from 12.1/37.3 down to a final value of 10.5/33. She was deemed clinically stable for discharge to the SNF for rehab.  Past medical and surgical history: Includes history of asthma, chronic atrial fibrillation, chronic diastolic congestive heart failure, COPD/oxygen dependent, CAD, essential hypertension, mixed dyslipidemia, and CKD. Surgeries and procedures include an implantable cardioverter defibrillator and CABG.  Family history: reviewed, non contributory due to advanced age.  Social history: Nondrinker.  Despite her history of COPD; she has never smoked.  She is a retired Engineer, civil (consulting).   Review of systems: The patient is an excellent historian.  She states that she was in Pulmonary Rehab at [facility] when her defibrillator went off and "knocked me on the floor."  She validates she had fractured her nose and had "a little bleeding in my brain."  She realizes that she is on Augmentin for the lower extremity cellulitis.  She has O2 dependent COPD.  She states that cardiopulmonary symptoms are relatively stable although she has some minor throat congestion.  Constitutional: No fever, significant weight change  Eyes: No redness, discharge, pain, vision change ENT/mouth: No nasal congestion, purulent discharge, earache, change in hearing, sore throat  Cardiovascular: No chest pain, palpitations, paroxysmal nocturnal dyspnea, claudication, edema  Respiratory: No hemoptysis Gastrointestinal: No heartburn, dysphagia, abdominal pain, nausea /vomiting, rectal bleeding, melena, change in bowels Genitourinary: No dysuria, hematuria, pyuria, incontinence, nocturia Dermatologic: No rash, pruritus, change in appearance of skin other than post traumatic ecchymoses Neurologic:  No  dizziness, syncope, seizures, numbness, tingling Psychiatric: No significant anxiety, depression, insomnia, anorexia Endocrine: No change in hair/nails, excessive thirst, excessive hunger, excessive urination  Hematologic/lymphatic: No significant lymphadenopathy Allergy/immunology: No itchy/watery eyes, significant sneezing, urticaria, angioedema  Physical exam:  Pertinent or positive findings: Initially she was asleep exhibiting shallow respirations without snoring or apnea.  Nasal O2 in place. There was a post traumatic boss @ the anterior hairline with eschar.  There is extensive ecchymosis over the right face.  Facies noted to be slightly weathered.  She has bilateral ptosis.  There is minimal hirsutism at the chin.  Heart rate is slightly rapid.  She has low-grade rhonchi in the right anterior chest.  Abdomen is protuberant.  She has tense edema of the lower extremities.  Pedal pulses are not palpable.  There is fusiform enlargement of the right knee with suggestion of some effusion.  This is tender to palpation.  She has faint erythema of the right lateral lower leg which partially blanches with pressure.  She has extensive right upper extremity ecchymosis as well as over the dorsum of the left hand.  There is ecchymosis over the right lateral knee as well.  General appearance: Adequately nourished; no acute distress, increased work of breathing is present.   Lymphatic: No lymphadenopathy about the head, neck, axilla. Eyes: No conjunctival inflammation or lid edema is present. There is no scleral icterus. Ears:  External ear exam shows no significant lesions or deformities.   Oral exam: Lips and gums are healthy appearing.There is no oropharyngeal erythema or exudate. Neck:  No thyromegaly, masses, tenderness noted.    Heart:  No gallop, murmur, click, rub.  Lungs: without wheezes, rales, rubs. Abdomen: Bowel sounds are normal.  Abdomen is soft and nontender with no organomegaly, hernias,  masses. GU: Deferred  Extremities:  No cyanosis, clubbing Neurologic exam:  Balance, Rhomberg, finger to nose testing could not be completed due to clinical state Skin: Warm & dry w/o tenting.  See clinical summary under each active problem in the Problem List with associated updated therapeutic plan

## 2023-03-26 NOTE — Assessment & Plan Note (Signed)
11/26 - 03/23/2023 H/H dropped from 12.1/37.3 down to 10.5/33.  Eliquis is being held; no bleeding dyscrasias documented by staff.  Continue to monitor.

## 2023-03-26 NOTE — Assessment & Plan Note (Signed)
Follow-up with neurosurgery as scheduled.  Eliquis is being held until 03/31/2023.  CT imaging will be repeated if there is decline in mental status or new GERD logic deficit.

## 2023-03-26 NOTE — Assessment & Plan Note (Signed)
ENT follow-up as outpatient once swelling resolves.

## 2023-03-26 NOTE — Assessment & Plan Note (Signed)
Currently she is in CKD high stage IV with a creatinine 1.93 and BUN of 28.  Med list reviewed; no change in dosage or meds at this time.

## 2023-03-26 NOTE — Assessment & Plan Note (Signed)
Slight tachycardia is present but rate is adequately controlled.  Cardiology follow-up as scheduled.

## 2023-03-26 NOTE — Assessment & Plan Note (Signed)
She is completing a course of Augmentin.  I stressed to her that she should take this medication after food intake, not on empty stomach.

## 2023-03-29 ENCOUNTER — Encounter (INDEPENDENT_AMBULATORY_CARE_PROVIDER_SITE_OTHER): Payer: Self-pay | Admitting: Otolaryngology

## 2023-03-30 ENCOUNTER — Encounter (HOSPITAL_COMMUNITY): Payer: Medicare PPO

## 2023-03-31 ENCOUNTER — Encounter (HOSPITAL_COMMUNITY): Payer: Medicare PPO

## 2023-04-01 ENCOUNTER — Other Ambulatory Visit (HOSPITAL_COMMUNITY)
Admission: RE | Admit: 2023-04-01 | Discharge: 2023-04-01 | Disposition: A | Discharge status: Home / Self Care | Payer: Medicare PPO | Source: Skilled Nursing Facility | Attending: Adult Health | Admitting: Adult Health

## 2023-04-01 ENCOUNTER — Encounter (HOSPITAL_COMMUNITY): Payer: Medicare PPO

## 2023-04-01 DIAGNOSIS — N184 Chronic kidney disease, stage 4 (severe): Secondary | ICD-10-CM | POA: Insufficient documentation

## 2023-04-01 LAB — BASIC METABOLIC PANEL
Anion gap: 11 (ref 5–15)
BUN: 42 mg/dL — ABNORMAL HIGH (ref 8–23)
CO2: 32 mmol/L (ref 22–32)
Calcium: 8.8 mg/dL — ABNORMAL LOW (ref 8.9–10.3)
Chloride: 96 mmol/L — ABNORMAL LOW (ref 98–111)
Creatinine, Ser: 1.93 mg/dL — ABNORMAL HIGH (ref 0.44–1.00)
GFR, Estimated: 24 mL/min — ABNORMAL LOW (ref >60)
Glucose, Bld: 79 mg/dL (ref 70–99)
Potassium: 3.7 mmol/L (ref 3.5–5.1)
Sodium: 139 mmol/L (ref 135–145)

## 2023-04-06 ENCOUNTER — Encounter (HOSPITAL_COMMUNITY): Payer: Medicare PPO

## 2023-04-08 ENCOUNTER — Encounter (HOSPITAL_COMMUNITY): Payer: Medicare PPO

## 2023-04-08 DIAGNOSIS — R0602 Shortness of breath: Secondary | ICD-10-CM | POA: Diagnosis not present

## 2023-04-08 DIAGNOSIS — R0609 Other forms of dyspnea: Secondary | ICD-10-CM | POA: Diagnosis not present

## 2023-04-09 ENCOUNTER — Encounter: Payer: Self-pay | Admitting: Adult Health

## 2023-04-09 ENCOUNTER — Non-Acute Institutional Stay (SKILLED_NURSING_FACILITY): Payer: Medicare PPO | Admitting: Adult Health

## 2023-04-09 DIAGNOSIS — R0602 Shortness of breath: Secondary | ICD-10-CM | POA: Diagnosis not present

## 2023-04-09 DIAGNOSIS — N184 Chronic kidney disease, stage 4 (severe): Secondary | ICD-10-CM | POA: Diagnosis not present

## 2023-04-09 DIAGNOSIS — R498 Other voice and resonance disorders: Secondary | ICD-10-CM | POA: Diagnosis not present

## 2023-04-09 DIAGNOSIS — I482 Chronic atrial fibrillation, unspecified: Secondary | ICD-10-CM

## 2023-04-09 DIAGNOSIS — I129 Hypertensive chronic kidney disease with stage 1 through stage 4 chronic kidney disease, or unspecified chronic kidney disease: Secondary | ICD-10-CM | POA: Diagnosis not present

## 2023-04-09 DIAGNOSIS — R55 Syncope and collapse: Secondary | ICD-10-CM | POA: Diagnosis not present

## 2023-04-09 DIAGNOSIS — M6281 Muscle weakness (generalized): Secondary | ICD-10-CM | POA: Diagnosis not present

## 2023-04-09 DIAGNOSIS — S065X0S Traumatic subdural hemorrhage without loss of consciousness, sequela: Secondary | ICD-10-CM | POA: Diagnosis not present

## 2023-04-09 DIAGNOSIS — I5032 Chronic diastolic (congestive) heart failure: Secondary | ICD-10-CM | POA: Diagnosis not present

## 2023-04-09 DIAGNOSIS — R262 Difficulty in walking, not elsewhere classified: Secondary | ICD-10-CM | POA: Diagnosis not present

## 2023-04-09 DIAGNOSIS — J9610 Chronic respiratory failure, unspecified whether with hypoxia or hypercapnia: Secondary | ICD-10-CM | POA: Diagnosis not present

## 2023-04-09 DIAGNOSIS — R488 Other symbolic dysfunctions: Secondary | ICD-10-CM | POA: Diagnosis not present

## 2023-04-09 DIAGNOSIS — Z9181 History of falling: Secondary | ICD-10-CM | POA: Diagnosis not present

## 2023-04-09 NOTE — Progress Notes (Signed)
Location:  [facility] Room Number: 143 Place of Service:  SNF (31)   CODE STATUS: full   Allergies  Allergen Reactions   Codeine Nausea And Vomiting   Fentanyl Itching    Itching, redness to scalp and neck   Keflex [Cephalexin] Itching and Rash    Chief Complaint  Patient presents with   Acute Visit    Care plan meeting     HPI:  We have come together for her care plan meeting. Family present BIMS 13/15 mood 6/30: not sleeping well; nervous; some depression. BCAT 41/50.  She requires dependent assist with her adls. Frequently incontinent of bladder and bowel. Dietary: regular diet: setup for meals appetite 50-100%; weight is 161 pounds. Therapy: ambulate 150 feet with a rollator with supervision; upper body supervision; lower body contact guard; BRP contact guard; supervision for transfers; outdoor at contact guard; needs car transfer; voice and breathing exercising . Activities: in room. She will continue to be followed for her chronic illnesses including: Benign hypertension with stage IV chronic kidney disease     Atrial fibrillation   Chronic diastolic congestive heart failure  Past Medical History:  Diagnosis Date   Anxiety    Asthma    Chronic atrial fibrillation (HCC)    Chronic back pain    Chronic diastolic heart failure (HCC)    Chronic obstructive pulmonary disease, unspecified (HCC)    Complete heart block (HCC)    COPD (chronic obstructive pulmonary disease) (HCC)    Coronary atherosclerosis of native coronary artery    a. s/p CABG in 1998 with LIMA-LAD, SVG-Diagonal, SVG-RI-OM b. DES to PLA in 2006   DDD (degenerative disc disease), lumbar    Essential hypertension    Headache    ICD (implantable cardioverter-defibrillator) in place    Mixed hyperlipidemia    Osteoarthritis    Ventricular fibrillation (HCC) 2003   a. seen on PPM interrogation a/w syncope    Past Surgical History:  Procedure Laterality Date   CARDIAC  DEFIBRILLATOR PLACEMENT     MDT dual chamber ICD   CARDIOVERSION N/A 08/09/2014   Procedure: CARDIOVERSION;  Surgeon: Thurmon Fair, MD;  Location: [facility] ENDOSCOPY;  Service: Cardiovascular;  Laterality: N/A;   CORONARY ARTERY BYPASS GRAFT     LIMA to LAD, SVG to diagonal, SVG to ramus and OM   ICD GENERATOR CHANGEOUT N/A 08/06/2021   Procedure: ICD GENERATOR CHANGEOUT;  Surgeon: Marinus Maw, MD;  Location: [facility] INVASIVE CV LAB;  Service: Cardiovascular;  Laterality: N/A;   IMPLANTABLE CARDIOVERTER DEFIBRILLATOR GENERATOR CHANGE N/A 09/25/2013   Procedure: IMPLANTABLE CARDIOVERTER DEFIBRILLATOR GENERATOR CHANGE;  Surgeon: Marinus Maw, MD;  Location: [facility] CATH LAB;  Service: Cardiovascular;  Laterality: N/A;   LEAD REVISION N/A 09/29/2013   Procedure: LEAD REVISION;  Surgeon: Duke Salvia, MD;  Location: [facility] CATH LAB;  Service: Cardiovascular;  Laterality: N/A;   TONSILLECTOMY     YAG LASER APPLICATION Left 12/27/2012   Procedure: YAG LASER APPLICATION;  Surgeon: Loraine Leriche T. Nile Riggs, MD;  Location: [facility] ORS;  Service: Ophthalmology;  Laterality: Left;   YAG LASER APPLICATION Right 01/10/2013   Procedure: YAG LASER APPLICATION;  Surgeon: Loraine Leriche T. Nile Riggs, MD;  Location: [facility] ORS;  Service: Ophthalmology;  Laterality: Right;    Social History   Socioeconomic History   Marital status: Widowed    Spouse name: Not on file   Number of children: 2   Years of education: Not on file   Highest education level: Not  on file  Occupational History   Occupation: Retired    Comment: Copywriter, advertising: RETIRED  Tobacco Use   Smoking status: Never   Smokeless tobacco: Never  Vaping Use   Vaping status: Never Used  Substance and Sexual Activity   Alcohol use: No    Alcohol/week: 0.0 standard drinks of alcohol   Drug use: No   Sexual activity: Never  Other Topics Concern   Not on file  Social History Narrative   Not on file   Social Drivers of Health   Financial Resource Strain: Not on file   Food Insecurity: No Food Insecurity (03/16/2023)   Hunger Vital Sign    Worried About Running Out of Food in the Last Year: Never true    Ran Out of Food in the Last Year: Never true  Transportation Needs: No Transportation Needs (03/16/2023)   PRAPARE - Administrator, Civil Service (Medical): No    Lack of Transportation (Non-Medical): No  Physical Activity: Not on file  Stress: Not on file  Social Connections: Not on file  Intimate Partner Violence: Not At Risk (03/16/2023)   Humiliation, Afraid, Rape, and Kick questionnaire    Fear of Current or Ex-Partner: No    Emotionally Abused: No    Physically Abused: No    Sexually Abused: No   Family History  Problem Relation Age of Onset   Heart attack Father    Heart attack Brother       VITAL SIGNS BP (!) 130/56   Pulse 74   Temp (!) 97.2 F (36.2 C)   Resp 20   Ht 5' (1.524 m)   Wt 161 lb (73 kg)   SpO2 98%   BMI 31.44 kg/m   Outpatient Encounter Medications as of 04/09/2023  Medication Sig   amiodarone (PACERONE) 200 MG tablet Take 200 mg by mouth 2 (two) times daily.   acetaminophen (TYLENOL) 325 MG tablet Take 2 tablets (650 mg total) by mouth every 6 (six) hours as needed for mild pain (or Fever >/= 101).   albuterol (VENTOLIN HFA) 108 (90 Base) MCG/ACT inhaler Inhale 2 puffs into the lungs every 6 (six) hours as needed for wheezing or shortness of breath.   apixaban (ELIQUIS) 2.5 MG TABS tablet Take 1 tablet (2.5 mg total) by mouth 2 (two) times daily.   atorvastatin (LIPITOR) 20 MG tablet Take 1 tablet (20 mg total) by mouth daily.   bisoprolol (ZEBETA) 5 MG tablet Take 1 tablet (5 mg total) by mouth daily.   Calcium Carbonate-Vitamin D (CALCIUM 600 + D PO) Take 1 tablet by mouth 2 (two) times daily.   dapagliflozin propanediol (FARXIGA) 10 MG TABS tablet Take 1 tablet (10 mg total) by mouth daily before breakfast.   Fluticasone-Umeclidin-Vilant (TRELEGY ELLIPTA) 100-62.5-25 MCG/ACT AEPB Inhale 1 puff  into the lungs daily.   folic acid (FOLVITE) 1 MG tablet Take 1 tablet (1 mg total) by mouth daily.   methocarbamol (ROBAXIN) 500 MG tablet Take 1 tablet (500 mg total) by mouth every 6 (six) hours as needed for muscle spasms.   Polyethyl Glycol-Propyl Glycol (SYSTANE OP) Place 1 drop into both eyes daily as needed (Dry eyes).   polyethylene glycol (MIRALAX / GLYCOLAX) 17 g packet Take 17 g by mouth daily as needed for mild constipation.   torsemide (DEMADEX) 100 MG tablet Take 1 tablet (100 mg total) by mouth daily.   [DISCONTINUED] amiodarone (PACERONE) 400 MG tablet Take 1 tablet (400  mg total) by mouth 2 (two) times daily for 7 days, THEN 0.5 tablets (200 mg total) 2 (two) times daily.   No facility-administered encounter medications on file as of 04/09/2023.     SIGNIFICANT DIAGNOSTIC EXAMS  LABS REVIEWED:   03-16-23: wbc 10.3; hgb 12.1; hct 37.3; mcv 100.0 plt 186; glucose 134; bun 45; creat 1.71; k+ 3.5; na++ 134; ca 9.2 gfr 28 03-19-23: wbc 17.0; hgb 11.4; hct 36.2; mcv 100.3 plt 188; glucose 112; bun 29; creat 1.26; k+ 4.2; na++ 141; ca 8.6 gfr 41; protein 7.5 albumin 3.5 tsh 2.759 03-22-41: wbc 11.5; hgb 10.5; hct 33.0; mcv 98.8 plt 225; glucose 101; bun 59; creat 1.93; k+ 3.9; na++ 136; ca 8.8 gfr 24    Review of Systems  Constitutional:  Negative for malaise/fatigue.  Respiratory:  Negative for cough and shortness of breath.   Cardiovascular:  Negative for chest pain, palpitations and leg swelling.  Gastrointestinal:  Negative for abdominal pain, constipation and heartburn.  Musculoskeletal:  Negative for back pain, joint pain and myalgias.  Skin:        Has area on leg   Neurological:  Negative for dizziness.  Psychiatric/Behavioral:  The patient is not nervous/anxious.    Physical Exam Constitutional:      General: She is not in acute distress.    Appearance: She is well-developed. She is not diaphoretic.  Neck:     Thyroid: No thyromegaly.  Cardiovascular:      Rate and Rhythm: Normal rate and regular rhythm.     Pulses: Normal pulses.     Heart sounds: Normal heart sounds.  Pulmonary:     Effort: Pulmonary effort is normal. No respiratory distress.     Breath sounds: Normal breath sounds.     Comments: 02 Abdominal:     General: Bowel sounds are normal. There is no distension.     Palpations: Abdomen is soft.     Tenderness: There is no abdominal tenderness.  Musculoskeletal:        General: Normal range of motion.     Cervical back: Neck supple.     Right lower leg: Edema present.     Left lower leg: Edema present.  Lymphadenopathy:     Cervical: No cervical adenopathy.  Skin:    General: Skin is warm and dry.     Comments: Hematoma right lower leg without inflammation   Neurological:     Mental Status: She is alert. Mental status is at baseline.  Psychiatric:        Mood and Affect: Mood normal.     ASSESSMENT/ PLAN:  TODAY  Benign hypertension with stage IV chronic kidney disease Atrial fibrillation Chronic diastolic congestive heart failure  Will continue therapy as directed Will continue medications  Will continue to monitor her status Goal of care is to return back to [place]  Time spent with patient: 40 minutes: therapy; medications; d/c plans   Synthia Innocent NP [facility]  call [phone]

## 2023-04-11 DIAGNOSIS — J9610 Chronic respiratory failure, unspecified whether with hypoxia or hypercapnia: Secondary | ICD-10-CM | POA: Diagnosis not present

## 2023-04-11 DIAGNOSIS — R0602 Shortness of breath: Secondary | ICD-10-CM | POA: Diagnosis not present

## 2023-04-11 DIAGNOSIS — R498 Other voice and resonance disorders: Secondary | ICD-10-CM | POA: Diagnosis not present

## 2023-04-11 DIAGNOSIS — Z9181 History of falling: Secondary | ICD-10-CM | POA: Diagnosis not present

## 2023-04-11 DIAGNOSIS — M6281 Muscle weakness (generalized): Secondary | ICD-10-CM | POA: Diagnosis not present

## 2023-04-11 DIAGNOSIS — R55 Syncope and collapse: Secondary | ICD-10-CM | POA: Diagnosis not present

## 2023-04-11 DIAGNOSIS — S065X0S Traumatic subdural hemorrhage without loss of consciousness, sequela: Secondary | ICD-10-CM | POA: Diagnosis not present

## 2023-04-11 DIAGNOSIS — R262 Difficulty in walking, not elsewhere classified: Secondary | ICD-10-CM | POA: Diagnosis not present

## 2023-04-11 DIAGNOSIS — R488 Other symbolic dysfunctions: Secondary | ICD-10-CM | POA: Diagnosis not present

## 2023-04-12 DIAGNOSIS — R0602 Shortness of breath: Secondary | ICD-10-CM | POA: Diagnosis not present

## 2023-04-12 DIAGNOSIS — S065X0S Traumatic subdural hemorrhage without loss of consciousness, sequela: Secondary | ICD-10-CM | POA: Diagnosis not present

## 2023-04-12 DIAGNOSIS — R262 Difficulty in walking, not elsewhere classified: Secondary | ICD-10-CM | POA: Diagnosis not present

## 2023-04-12 DIAGNOSIS — J9610 Chronic respiratory failure, unspecified whether with hypoxia or hypercapnia: Secondary | ICD-10-CM | POA: Diagnosis not present

## 2023-04-12 DIAGNOSIS — R488 Other symbolic dysfunctions: Secondary | ICD-10-CM | POA: Diagnosis not present

## 2023-04-12 DIAGNOSIS — R498 Other voice and resonance disorders: Secondary | ICD-10-CM | POA: Diagnosis not present

## 2023-04-12 DIAGNOSIS — Z9181 History of falling: Secondary | ICD-10-CM | POA: Diagnosis not present

## 2023-04-12 DIAGNOSIS — M6281 Muscle weakness (generalized): Secondary | ICD-10-CM | POA: Diagnosis not present

## 2023-04-12 DIAGNOSIS — R55 Syncope and collapse: Secondary | ICD-10-CM | POA: Diagnosis not present

## 2023-04-13 ENCOUNTER — Encounter (HOSPITAL_COMMUNITY): Payer: Medicare PPO

## 2023-04-13 DIAGNOSIS — R488 Other symbolic dysfunctions: Secondary | ICD-10-CM | POA: Diagnosis not present

## 2023-04-13 DIAGNOSIS — M6281 Muscle weakness (generalized): Secondary | ICD-10-CM | POA: Diagnosis not present

## 2023-04-13 DIAGNOSIS — R498 Other voice and resonance disorders: Secondary | ICD-10-CM | POA: Diagnosis not present

## 2023-04-13 DIAGNOSIS — S065X0S Traumatic subdural hemorrhage without loss of consciousness, sequela: Secondary | ICD-10-CM | POA: Diagnosis not present

## 2023-04-13 DIAGNOSIS — Z9181 History of falling: Secondary | ICD-10-CM | POA: Diagnosis not present

## 2023-04-13 DIAGNOSIS — J9610 Chronic respiratory failure, unspecified whether with hypoxia or hypercapnia: Secondary | ICD-10-CM | POA: Diagnosis not present

## 2023-04-13 DIAGNOSIS — R262 Difficulty in walking, not elsewhere classified: Secondary | ICD-10-CM | POA: Diagnosis not present

## 2023-04-13 DIAGNOSIS — R0602 Shortness of breath: Secondary | ICD-10-CM | POA: Diagnosis not present

## 2023-04-13 DIAGNOSIS — R55 Syncope and collapse: Secondary | ICD-10-CM | POA: Diagnosis not present

## 2023-04-15 ENCOUNTER — Encounter (HOSPITAL_COMMUNITY)
Admission: RE | Admit: 2023-04-15 | Discharge: 2023-04-15 | Disposition: A | Discharge status: Home / Self Care | Payer: Medicare PPO | Source: Ambulatory Visit | Attending: Internal Medicine | Admitting: Internal Medicine

## 2023-04-15 DIAGNOSIS — R0602 Shortness of breath: Secondary | ICD-10-CM | POA: Diagnosis not present

## 2023-04-15 DIAGNOSIS — M6281 Muscle weakness (generalized): Secondary | ICD-10-CM | POA: Diagnosis not present

## 2023-04-15 DIAGNOSIS — I272 Pulmonary hypertension, unspecified: Secondary | ICD-10-CM | POA: Insufficient documentation

## 2023-04-15 DIAGNOSIS — Z9181 History of falling: Secondary | ICD-10-CM | POA: Diagnosis not present

## 2023-04-15 DIAGNOSIS — R262 Difficulty in walking, not elsewhere classified: Secondary | ICD-10-CM | POA: Diagnosis not present

## 2023-04-15 DIAGNOSIS — S065X0S Traumatic subdural hemorrhage without loss of consciousness, sequela: Secondary | ICD-10-CM | POA: Diagnosis not present

## 2023-04-15 DIAGNOSIS — R488 Other symbolic dysfunctions: Secondary | ICD-10-CM | POA: Diagnosis not present

## 2023-04-15 DIAGNOSIS — R55 Syncope and collapse: Secondary | ICD-10-CM | POA: Diagnosis not present

## 2023-04-15 DIAGNOSIS — R498 Other voice and resonance disorders: Secondary | ICD-10-CM | POA: Diagnosis not present

## 2023-04-15 DIAGNOSIS — J9610 Chronic respiratory failure, unspecified whether with hypoxia or hypercapnia: Secondary | ICD-10-CM | POA: Diagnosis not present

## 2023-04-16 DIAGNOSIS — S065X0S Traumatic subdural hemorrhage without loss of consciousness, sequela: Secondary | ICD-10-CM | POA: Diagnosis not present

## 2023-04-16 DIAGNOSIS — R55 Syncope and collapse: Secondary | ICD-10-CM | POA: Diagnosis not present

## 2023-04-16 DIAGNOSIS — M6281 Muscle weakness (generalized): Secondary | ICD-10-CM | POA: Diagnosis not present

## 2023-04-16 DIAGNOSIS — Z9181 History of falling: Secondary | ICD-10-CM | POA: Diagnosis not present

## 2023-04-16 DIAGNOSIS — R498 Other voice and resonance disorders: Secondary | ICD-10-CM | POA: Diagnosis not present

## 2023-04-16 DIAGNOSIS — J9610 Chronic respiratory failure, unspecified whether with hypoxia or hypercapnia: Secondary | ICD-10-CM | POA: Diagnosis not present

## 2023-04-16 DIAGNOSIS — R0602 Shortness of breath: Secondary | ICD-10-CM | POA: Diagnosis not present

## 2023-04-16 DIAGNOSIS — R488 Other symbolic dysfunctions: Secondary | ICD-10-CM | POA: Diagnosis not present

## 2023-04-16 DIAGNOSIS — R262 Difficulty in walking, not elsewhere classified: Secondary | ICD-10-CM | POA: Diagnosis not present

## 2023-04-17 DIAGNOSIS — J449 Chronic obstructive pulmonary disease, unspecified: Secondary | ICD-10-CM | POA: Diagnosis not present

## 2023-04-17 DIAGNOSIS — I5033 Acute on chronic diastolic (congestive) heart failure: Secondary | ICD-10-CM | POA: Diagnosis not present

## 2023-04-17 DIAGNOSIS — J9610 Chronic respiratory failure, unspecified whether with hypoxia or hypercapnia: Secondary | ICD-10-CM | POA: Diagnosis not present

## 2023-04-19 DIAGNOSIS — M6281 Muscle weakness (generalized): Secondary | ICD-10-CM | POA: Diagnosis not present

## 2023-04-19 DIAGNOSIS — Z9181 History of falling: Secondary | ICD-10-CM | POA: Diagnosis not present

## 2023-04-19 DIAGNOSIS — R262 Difficulty in walking, not elsewhere classified: Secondary | ICD-10-CM | POA: Diagnosis not present

## 2023-04-19 DIAGNOSIS — J9610 Chronic respiratory failure, unspecified whether with hypoxia or hypercapnia: Secondary | ICD-10-CM | POA: Diagnosis not present

## 2023-04-19 DIAGNOSIS — S065X0S Traumatic subdural hemorrhage without loss of consciousness, sequela: Secondary | ICD-10-CM | POA: Diagnosis not present

## 2023-04-19 DIAGNOSIS — R55 Syncope and collapse: Secondary | ICD-10-CM | POA: Diagnosis not present

## 2023-04-19 DIAGNOSIS — R0602 Shortness of breath: Secondary | ICD-10-CM | POA: Diagnosis not present

## 2023-04-19 DIAGNOSIS — R498 Other voice and resonance disorders: Secondary | ICD-10-CM | POA: Diagnosis not present

## 2023-04-19 DIAGNOSIS — R488 Other symbolic dysfunctions: Secondary | ICD-10-CM | POA: Diagnosis not present

## 2023-04-20 ENCOUNTER — Encounter (HOSPITAL_COMMUNITY): Payer: Self-pay | Admitting: *Deleted

## 2023-04-20 ENCOUNTER — Encounter (HOSPITAL_COMMUNITY): Payer: Medicare PPO

## 2023-04-20 DIAGNOSIS — R498 Other voice and resonance disorders: Secondary | ICD-10-CM | POA: Diagnosis not present

## 2023-04-20 DIAGNOSIS — Z9181 History of falling: Secondary | ICD-10-CM | POA: Diagnosis not present

## 2023-04-20 DIAGNOSIS — M6281 Muscle weakness (generalized): Secondary | ICD-10-CM | POA: Diagnosis not present

## 2023-04-20 DIAGNOSIS — I272 Pulmonary hypertension, unspecified: Secondary | ICD-10-CM

## 2023-04-20 DIAGNOSIS — S065X0S Traumatic subdural hemorrhage without loss of consciousness, sequela: Secondary | ICD-10-CM | POA: Diagnosis not present

## 2023-04-20 DIAGNOSIS — R0602 Shortness of breath: Secondary | ICD-10-CM | POA: Diagnosis not present

## 2023-04-20 DIAGNOSIS — R55 Syncope and collapse: Secondary | ICD-10-CM | POA: Diagnosis not present

## 2023-04-20 DIAGNOSIS — R488 Other symbolic dysfunctions: Secondary | ICD-10-CM | POA: Diagnosis not present

## 2023-04-20 DIAGNOSIS — R262 Difficulty in walking, not elsewhere classified: Secondary | ICD-10-CM | POA: Diagnosis not present

## 2023-04-20 DIAGNOSIS — J9610 Chronic respiratory failure, unspecified whether with hypoxia or hypercapnia: Secondary | ICD-10-CM | POA: Diagnosis not present

## 2023-04-20 NOTE — Progress Notes (Signed)
 Pulmonary Individual Treatment Plan  Patient Details  Name: [name] [name] MRN: [id number] Date of Birth: April 22, 1931 Referring Provider:   Flowsheet Row PULMONARY REHAB OTHER RESP ORIENTATION from 12/17/2022 in [name]  Referring Provider Darlean Ade MD       Initial Encounter Date:  Flowsheet Row PULMONARY REHAB OTHER RESP ORIENTATION from 12/17/2022 in [facility]  Date 12/17/22       Visit Diagnosis: Pulmonary hypertension (HCC)  Patient's Home Medications on Admission:   Current Outpatient Medications:    acetaminophen  (TYLENOL ) 325 MG tablet, Take 2 tablets (650 mg total) by mouth every 6 (six) hours as needed for mild pain (or Fever >/= 101)., Disp: 100 tablet, Rfl: 2   albuterol  (VENTOLIN  HFA) 108 (90 Base) MCG/ACT inhaler, Inhale 2 puffs into the lungs every 6 (six) hours as needed for wheezing or shortness of breath., Disp: 18 g, Rfl: 2   amiodarone  (PACERONE ) 200 MG tablet, Take 200 mg by mouth 2 (two) times daily., Disp: , Rfl:    apixaban  (ELIQUIS ) 2.5 MG TABS tablet, Take 1 tablet (2.5 mg total) by mouth 2 (two) times daily., Disp: , Rfl:    atorvastatin  (LIPITOR) 20 MG tablet, Take 1 tablet (20 mg total) by mouth daily., Disp: 90 tablet, Rfl: 3   bisoprolol  (ZEBETA ) 5 MG tablet, Take 1 tablet (5 mg total) by mouth daily., Disp: , Rfl:    Calcium  Carbonate-Vitamin D (CALCIUM  600 + D PO), Take 1 tablet by mouth 2 (two) times daily., Disp: , Rfl:    dapagliflozin  propanediol (FARXIGA ) 10 MG TABS tablet, Take 1 tablet (10 mg total) by mouth daily before breakfast., Disp: 30 tablet, Rfl: 6   Fluticasone -Umeclidin-Vilant (TRELEGY ELLIPTA ) 100-62.5-25 MCG/ACT AEPB, Inhale 1 puff into the lungs daily., Disp: 60 each, Rfl: 6   folic acid  (FOLVITE ) 1 MG tablet, Take 1 tablet (1 mg total) by mouth daily., Disp: 30 tablet, Rfl: 3   methocarbamol  (ROBAXIN ) 500 MG tablet, Take 1 tablet (500 mg total) by mouth every 6 (six) hours as  needed for muscle spasms., Disp: , Rfl:    Polyethyl Glycol-Propyl Glycol (SYSTANE OP), Place 1 drop into both eyes daily as needed (Dry eyes)., Disp: , Rfl:    polyethylene glycol (MIRALAX  / GLYCOLAX ) 17 g packet, Take 17 g by mouth daily as needed for mild constipation., Disp: 14 each, Rfl: 0   torsemide  (DEMADEX ) 100 MG tablet, Take 1 tablet (100 mg total) by mouth daily., Disp: , Rfl:   Past Medical History: Past Medical History:  Diagnosis Date   Anxiety    Asthma    Chronic atrial fibrillation (HCC)    Chronic back pain    Chronic diastolic heart failure (HCC)    Chronic obstructive pulmonary disease, unspecified (HCC)    Complete heart block (HCC)    COPD (chronic obstructive pulmonary disease) (HCC)    Coronary atherosclerosis of native coronary artery    a. s/p CABG in 1998 with LIMA-LAD, SVG-Diagonal, SVG-RI-OM b. DES to PLA in 2006   DDD (degenerative disc disease), lumbar    Essential hypertension    Headache    ICD (implantable cardioverter-defibrillator) in place    Mixed hyperlipidemia    Osteoarthritis    Ventricular fibrillation (HCC) 2003   a. seen on PPM interrogation a/w syncope    Tobacco Use: Social History   Tobacco Use  Smoking Status Never  Smokeless Tobacco Never    Labs: Review Flowsheet  More data may exist  Latest Ref Rng & Units 03/08/2007 03/23/2008 08/29/2009 01/24/2013 06/14/2022  Labs for ITP Cardiac and Pulmonary Rehab  Cholestrol 0 - 200 mg/dL 884  895  881  888  -  LDL (calc) 0 - 99 mg/dL 58  56  58  56  -  HDL-C >39 mg/dL 72.2  71.9  36  31  -  Trlycerides <150 mg/dL 852  898  877  880  -  Bicarbonate 20.0 - 28.0 mmol/L - - - - 34.2   O2 Saturation % - - - - 53.0     Capillary Blood Glucose: No results found for: GLUCAP   Pulmonary Assessment Scores:  Pulmonary Assessment Scores     Row Name 12/17/22 1456         ADL UCSD   ADL Phase Entry     SOB Score total 75     Rest 0     Walk 3     Stairs 5     Bath 3      Dress 3     Shop 4       CAT Score   CAT Score 26       mMRC Score   mMRC Score 3             UCSD: Self-administered rating of dyspnea associated with activities of daily living (ADLs) 6-point scale (0 = not at all to 5 = maximal or unable to do because of breathlessness)  Scoring Scores range from 0 to 120.  Minimally important difference is 5 units  CAT: CAT can identify the health impairment of COPD patients and is better correlated with disease progression.  CAT has a scoring range of zero to 40. The CAT score is classified into four groups of low (less than 10), medium (10 - 20), high (21-30) and very high (31-40) based on the impact level of disease on health status. A CAT score over 10 suggests significant symptoms.  A worsening CAT score could be explained by an exacerbation, poor medication adherence, poor inhaler technique, or progression of COPD or comorbid conditions.  CAT MCID is 2 points  mMRC: mMRC (Modified Medical Research Council) Dyspnea Scale is used to assess the degree of baseline functional disability in patients of respiratory disease due to dyspnea. No minimal important difference is established. A decrease in score of 1 point or greater is considered a positive change.   Pulmonary Function Assessment:   Exercise Target Goals: Exercise Program Goal: Individual exercise prescription set using results from initial 6 min walk test and THRR while considering  patient's activity barriers and safety.   Exercise Prescription Goal: Initial exercise prescription builds to 30-45 minutes a day of aerobic activity, 2-3 days per week.  Home exercise guidelines will be given to patient during program as part of exercise prescription that the participant will acknowledge.  Activity Barriers & Risk Stratification:  Activity Barriers & Cardiac Risk Stratification - 12/17/22 1524       Activity Barriers & Cardiac Risk Stratification   Activity Barriers  Deconditioning;Muscular Weakness;Back Problems;Arthritis;Shortness of Breath;Assistive Device;Balance Concerns             6 Minute Walk:  6 Minute Walk     Row Name 12/17/22 1516         6 Minute Walk   Phase Initial     Distance 244 feet     Walk Time 2.83 minutes     # of Rest Breaks 1  2:10  MPH 0.97     RPE 15     Perceived Dyspnea  3     Symptoms Yes (comment)     Comments SOB     Resting HR 63 bpm     Resting BP 92/48     Resting Oxygen  Saturation  92 %     Exercise Oxygen  Saturation  during 6 min walk 84 %     Max Ex. HR 87 bpm     Max Ex. BP 138/82     2 Minute Post BP 134/70       Interval HR   1 Minute HR 66     2 Minute HR 68     3 Minute HR 67     4 Minute HR 63     5 Minute HR 63     6 Minute HR 87     2 Minute Post HR 64     Interval Heart Rate? Yes       Interval Oxygen    Interval Oxygen ? Yes     Baseline Oxygen  Saturation % 92 %     1 Minute Oxygen  Saturation % 87 %     1 Minute Liters of Oxygen  3 L  pulsed     2 Minute Oxygen  Saturation % 84 %     2 Minute Liters of Oxygen  3 L     3 Minute Oxygen  Saturation % 91 %     3 Minute Liters of Oxygen  3 L     4 Minute Oxygen  Saturation % 92 %     4 Minute Liters of Oxygen  3 L     5 Minute Oxygen  Saturation % 93 %     5 Minute Liters of Oxygen  3 L     6 Minute Oxygen  Saturation % 92 %     6 Minute Liters of Oxygen  3 L     2 Minute Post Oxygen  Saturation % 87 %     2 Minute Post Liters of Oxygen  3 L              Oxygen  Initial Assessment:  Oxygen  Initial Assessment - 12/17/22 1455       Home Oxygen    Home Oxygen  Device Home Concentrator;E-Tanks    Sleep Oxygen  Prescription Continuous    Liters per minute 2    Home Resting Oxygen  Prescription Continuous    Liters per minute 2    Compliance with Home Oxygen  Use Yes      Intervention   Short Term Goals To learn and exhibit compliance with exercise, home and travel O2 prescription;To learn and understand importance of monitoring  SPO2 with pulse oximeter and demonstrate accurate use of the pulse oximeter.;To learn and understand importance of maintaining oxygen  saturations>88%;To learn and demonstrate proper pursed lip breathing techniques or other breathing techniques. ;To learn and demonstrate proper use of respiratory medications    Long  Term Goals Exhibits compliance with exercise, home  and travel O2 prescription;Verbalizes importance of monitoring SPO2 with pulse oximeter and return demonstration;Maintenance of O2 saturations>88%;Exhibits proper breathing techniques, such as pursed lip breathing or other method taught during program session;Compliance with respiratory medication;Demonstrates proper use of MDI's             Oxygen  Re-Evaluation:  Oxygen  Re-Evaluation     Row Name 12/24/22 1350 01/19/23 1348 02/09/23 1401         Program Oxygen  Prescription   Program Oxygen  Prescription -- Continuous Continuous     Liters per minute --  3 3       Home Oxygen    Home Oxygen  Device -- Home Concentrator;E-Tanks Home Concentrator;E-Tanks     Sleep Oxygen  Prescription -- Continuous Continuous     Liters per minute -- 2 2     Home Exercise Oxygen  Prescription -- Continuous Continuous     Liters per minute -- 3 3     Home Resting Oxygen  Prescription -- Continuous Continuous     Liters per minute -- 2 2     Compliance with Home Oxygen  Use -- Yes Yes       Goals/Expected Outcomes   Short Term Goals To learn and demonstrate proper pursed lip breathing techniques or other breathing techniques.  To learn and demonstrate proper pursed lip breathing techniques or other breathing techniques.  To learn and demonstrate proper pursed lip breathing techniques or other breathing techniques. ;To learn and understand importance of maintaining oxygen  saturations>88%;To learn and understand importance of monitoring SPO2 with pulse oximeter and demonstrate accurate use of the pulse oximeter.;To learn and exhibit compliance with  exercise, home and travel O2 prescription;To learn and demonstrate proper use of respiratory medications     Long  Term Goals Exhibits proper breathing techniques, such as pursed lip breathing or other method taught during program session Exhibits proper breathing techniques, such as pursed lip breathing or other method taught during program session Exhibits compliance with exercise, home  and travel O2 prescription;Verbalizes importance of monitoring SPO2 with pulse oximeter and return demonstration;Exhibits proper breathing techniques, such as pursed lip breathing or other method taught during program session;Maintenance of O2 saturations>88%;Compliance with respiratory medication;Demonstrates proper use of MDI's     Comments Reviewed PLB technique with pt.  Talked about how it works and it's importance in maintaining their exercise saturations. [name] [name] stated that she does have to take breaks when walking due to her SOB. She said that since starting she has not noticed improvement in her SOB. [name] [name] is doing well in rehab. She is good about wearing her oxygen  and finds it helpful.  However, she has noted that sometimes when she wakes up at night it has come off and she will put it back on.  She is doing well with her inhalers.  She is still working on her PLB and finds it helpful and tries to remember to do it.     Goals/Expected Outcomes Short: Become more profiecient at using PLB.   Long: Become independent at using PLB. Short: continue to use PLB for SOB   long term:continue to exericse for imrpovment and compliance with oxygen  Short: Conitue to work on PLB at home Long: Conitnue to work on breathing              Oxygen  Discharge (Final Oxygen  Re-Evaluation):  Oxygen  Re-Evaluation - 02/09/23 1401       Program Oxygen  Prescription   Program Oxygen  Prescription Continuous    Liters per minute 3      Home Oxygen    Home Oxygen  Device Home Concentrator;E-Tanks    Sleep Oxygen  Prescription  Continuous    Liters per minute 2    Home Exercise Oxygen  Prescription Continuous    Liters per minute 3    Home Resting Oxygen  Prescription Continuous    Liters per minute 2    Compliance with Home Oxygen  Use Yes      Goals/Expected Outcomes   Short Term Goals To learn and demonstrate proper pursed lip breathing techniques or other breathing techniques. ;To learn and understand importance of maintaining  oxygen  saturations>88%;To learn and understand importance of monitoring SPO2 with pulse oximeter and demonstrate accurate use of the pulse oximeter.;To learn and exhibit compliance with exercise, home and travel O2 prescription;To learn and demonstrate proper use of respiratory medications    Long  Term Goals Exhibits compliance with exercise, home  and travel O2 prescription;Verbalizes importance of monitoring SPO2 with pulse oximeter and return demonstration;Exhibits proper breathing techniques, such as pursed lip breathing or other method taught during program session;Maintenance of O2 saturations>88%;Compliance with respiratory medication;Demonstrates proper use of MDI's    Comments [name] [name] is doing well in rehab. She is good about wearing her oxygen  and finds it helpful.  However, she has noted that sometimes when she wakes up at night it has come off and she will put it back on.  She is doing well with her inhalers.  She is still working on her PLB and finds it helpful and tries to remember to do it.    Goals/Expected Outcomes Short: Conitue to work on PLB at home Long: Conitnue to work on breathing             Initial Exercise Prescription:  Initial Exercise Prescription - 12/17/22 1500       Date of Initial Exercise RX and Referring Provider   Date 12/17/22    Referring Provider Darlean Ade MD      Oxygen    Oxygen  Continuous    Liters 3    Maintain Oxygen  Saturation 88% or higher      NuStep   Level 1    SPM 80    Minutes 30    METs 1.5      Prescription Details    Frequency (times per week) 2    Duration Progress to 30 minutes of continuous aerobic without signs/symptoms of physical distress      Intensity   THRR 40-80% of Max Heartrate 90-118    Ratings of Perceived Exertion 11-13    Perceived Dyspnea 0-4      Progression   Progression Continue to progress workloads to maintain intensity without signs/symptoms of physical distress.      Resistance Training   Training Prescription Yes    Weight 2 lb    Reps 10-15             Perform Capillary Blood Glucose checks as needed.  Exercise Prescription Changes:   Exercise Prescription Changes     Row Name 12/17/22 1500 12/24/22 1400 01/19/23 1300 02/09/23 1400 02/18/23 1500     Response to Exercise   Blood Pressure (Admit) 92/48 106/50 92/60 -- 120/60   Blood Pressure (Exercise) 134/82 110/60 -- -- --   Blood Pressure (Exit) 134/70 106/62 124/64 -- 102/66   Heart Rate (Admit) 63 bpm 66 bpm 68 bpm -- 64 bpm   Heart Rate (Exercise) 87 bpm 69 bpm 98 bpm -- 64 bpm   Heart Rate (Exit) 64 bpm 65 bpm 91 bpm -- 61 bpm   Oxygen  Saturation (Admit) 92 % 94 % 94 % -- 98 %   Oxygen  Saturation (Exercise) 84 % 95 % 88 % -- 95 %   Oxygen  Saturation (Exit) 87 % 98 % 98 % -- 100 %   Rating of Perceived Exertion (Exercise) 15 13 12  -- 12   Perceived Dyspnea (Exercise) 3 1 2  -- 3   Symptoms SOB, fatigue -- -- -- --   Comments walk test results -- -- -- --   Duration -- Continue with 30 min of aerobic exercise without  signs/symptoms of physical distress. Continue with 30 min of aerobic exercise without signs/symptoms of physical distress. -- Continue with 30 min of aerobic exercise without signs/symptoms of physical distress.   Intensity -- THRR unchanged THRR unchanged -- THRR unchanged     Progression   Progression -- Continue to progress workloads to maintain intensity without signs/symptoms of physical distress. Continue to progress workloads to maintain intensity without signs/symptoms of physical  distress. -- Continue to progress workloads to maintain intensity without signs/symptoms of physical distress.     Resistance Training   Training Prescription -- Yes Yes -- Yes   Weight -- 2 2 lbs / yellow band -- 2 lbs / yellow   Reps -- 10-15 10-15 -- 10-15     Oxygen    Oxygen  -- Continuous Continuous -- Continuous   Liters -- 3 3 -- 3     NuStep   Level -- 1 2 -- 1   SPM -- 48 49 -- 56   Minutes -- 30 30 -- 30   METs -- 1.6 1.6 -- 1.7     Home Exercise Plan   Plans to continue exercise at -- -- -- Home (comment)  walking Home (comment)   Frequency -- -- -- Add 2 additional days to program exercise sessions. Add 2 additional days to program exercise sessions.   Initial Home Exercises Provided -- -- -- 02/09/23 --     Oxygen    Maintain Oxygen  Saturation -- 88% or higher 88% or higher -- 88% or higher    Row Name 03/02/23 1500             Response to Exercise   Blood Pressure (Admit) 122/62       Blood Pressure (Exit) 122/62       Heart Rate (Admit) 63 bpm       Heart Rate (Exercise) 70 bpm       Heart Rate (Exit) 61 bpm       Oxygen  Saturation (Admit) 95 %       Oxygen  Saturation (Exercise) 90 %       Oxygen  Saturation (Exit) 91 %       Rating of Perceived Exertion (Exercise) 13       Perceived Dyspnea (Exercise) 2       Duration Continue with 30 min of aerobic exercise without signs/symptoms of physical distress.       Intensity THRR unchanged         Progression   Progression Continue to progress workloads to maintain intensity without signs/symptoms of physical distress.         Resistance Training   Training Prescription Yes       Weight 2 lbs       Reps 10-15         Oxygen    Oxygen  Continuous       Liters 2         NuStep   Level 2       SPM 53       Minutes 30       METs 2.15         Home Exercise Plan   Plans to continue exercise at Home (comment)       Frequency Add 2 additional days to program exercise sessions.         Oxygen    Maintain  Oxygen  Saturation 88% or higher                Exercise Comments:  Exercise Comments     Row Name 12/24/22 1349 04/16/23 0816         Exercise Comments First full day of exercise!  Patient was oriented to gym and equipment including functions, settings, policies, and procedures.  Patient's individual exercise prescription and treatment plan were reviewed.  All starting workloads were established based on the results of the 6 minute walk test done at initial orientation visit.  The plan for exercise progression was also introduced and progression will be customized based on patient's performance and goals. Mathew is on medical hold due to resting medical episode               Exercise Goals and Review:   Exercise Goals     Row Name 12/17/22 1526             Exercise Goals   Increase Physical Activity Yes       Intervention Provide advice, education, support and counseling about physical activity/exercise needs.;Develop an individualized exercise prescription for aerobic and resistive training based on initial evaluation findings, risk stratification, comorbidities and participant's personal goals.       Expected Outcomes Short Term: Attend rehab on a regular basis to increase amount of physical activity.;Long Term: Exercising regularly at least 3-5 days a week.;Long Term: Add in home exercise to make exercise part of routine and to increase amount of physical activity.       Increase Strength and Stamina Yes       Intervention Provide advice, education, support and counseling about physical activity/exercise needs.;Develop an individualized exercise prescription for aerobic and resistive training based on initial evaluation findings, risk stratification, comorbidities and participant's personal goals.       Expected Outcomes Short Term: Increase workloads from initial exercise prescription for resistance, speed, and METs.;Short Term: Perform resistance training exercises  routinely during rehab and add in resistance training at home;Long Term: Improve cardiorespiratory fitness, muscular endurance and strength as measured by increased METs and functional capacity ( )       Able to understand and use rate of perceived exertion (RPE) scale Yes       Intervention Provide education and explanation on how to use RPE scale       Expected Outcomes Short Term: Able to use RPE daily in rehab to express subjective intensity level;Long Term:  Able to use RPE to guide intensity level when exercising independently       Able to understand and use Dyspnea scale Yes       Intervention Provide education and explanation on how to use Dyspnea scale       Expected Outcomes Short Term: Able to use Dyspnea scale daily in rehab to express subjective sense of shortness of breath during exertion;Long Term: Able to use Dyspnea scale to guide intensity level when exercising independently       Knowledge and understanding of Target Heart Rate Range (THRR) Yes       Intervention Provide education and explanation of THRR including how the numbers were predicted and where they are located for reference       Expected Outcomes Short Term: Able to state/look up THRR;Short Term: Able to use daily as guideline for intensity in rehab;Long Term: Able to use THRR to govern intensity when exercising independently       Able to check pulse independently Yes       Intervention Provide education and demonstration on how to check pulse in carotid and radial arteries.;Review the importance of being  able to check your own pulse for safety during independent exercise       Expected Outcomes Short Term: Able to explain why pulse checking is important during independent exercise;Long Term: Able to check pulse independently and accurately       Understanding of Exercise Prescription Yes       Intervention Provide education, explanation, and written materials on patient's individual exercise prescription        Expected Outcomes Short Term: Able to explain program exercise prescription;Long Term: Able to explain home exercise prescription to exercise independently                Exercise Goals Re-Evaluation :  Exercise Goals Re-Evaluation     Row Name 12/24/22 1349 01/19/23 1331 01/20/23 1327 02/09/23 1353 03/03/23 1015     Exercise Goal Re-Evaluation   Exercise Goals Review Able to understand and use rate of perceived exertion (RPE) scale;Knowledge and understanding of Target Heart Rate Range (THRR);Understanding of Exercise Prescription;Able to understand and use Dyspnea scale Increase Physical Activity;Increase Strength and Stamina Increase Physical Activity;Increase Strength and Stamina;Understanding of Exercise Prescription Increase Physical Activity;Increase Strength and Stamina;Understanding of Exercise Prescription Increase Physical Activity;Able to understand and use rate of perceived exertion (RPE) scale;Understanding of Exercise Prescription   Comments Reviewed RPE  and dyspnea scale, THR and program prescription with pt today.  Pt voiced understanding and was given a copy of goals to take home. [name] [name] has been doing well in rehab. At the start she did not think she was going to be able to do rehab but she is proud that she has been able to do the NuStep. She said that she has not noticed an increase in her energy level or SOB. She continues to do well on the nustep and is on level 2. [name] [name] is doing great in rehab and tolerating exercise well. She has increased her level on the NuStep to level 2. She is exercising at an RPE of 12. Will continue to monitor and progress as able. [name] [name] is doing well in rehab.  She has not noticed much improvement in her breathing but she does have more stamina.  She is walking some at home already.  She wants to get better.  We have also noticed that she no longer needs as many rest breaks in rehab. Reviewed home exercise with pt today.  Pt plans to walk at home for  exercise.  Reviewed THR, pulse, RPE, sign and symptoms, pulse oximetery and when to call 911 or MD.  Also discussed weather considerations and indoor options.  Pt voiced understanding. --   Expected Outcomes Short: Use RPE daily to regulate intensity.  Long: Follow program prescription in THR. Short: continue to increase levels when able    long term: continue to attend rehab Short: increase to level 3 for one half in the next two weeks    long term: continue to monitor and progress as able, Short: Continue to add exercise at home Long: continue to improve stamina --    Row Name 03/03/23 1016 04/16/23 0816           Exercise Goal Re-Evaluation   Exercise Goals Review Increase Physical Activity;Able to understand and use rate of perceived exertion (RPE) scale;Understanding of Exercise Prescription --      Comments [name] [name] is doing well in rehab. She is deconditioned but has seen improvment in her stamina. Shhe is on level 2 on the nustep. Will continue tp montior and progress as able [name] is  on medical hold due to resting medical episode. Will update when able      Expected Outcomes short : increase to level 3 in the next two weeks   long term: continue to attend rehab. --               Discharge Exercise Prescription (Final Exercise Prescription Changes):  Exercise Prescription Changes - 03/02/23 1500       Response to Exercise   Blood Pressure (Admit) 122/62    Blood Pressure (Exit) 122/62    Heart Rate (Admit) 63 bpm    Heart Rate (Exercise) 70 bpm    Heart Rate (Exit) 61 bpm    Oxygen  Saturation (Admit) 95 %    Oxygen  Saturation (Exercise) 90 %    Oxygen  Saturation (Exit) 91 %    Rating of Perceived Exertion (Exercise) 13    Perceived Dyspnea (Exercise) 2    Duration Continue with 30 min of aerobic exercise without signs/symptoms of physical distress.    Intensity THRR unchanged      Progression   Progression Continue to progress workloads to maintain intensity without  signs/symptoms of physical distress.      Resistance Training   Training Prescription Yes    Weight 2 lbs    Reps 10-15      Oxygen    Oxygen  Continuous    Liters 2      NuStep   Level 2    SPM 53    Minutes 30    METs 2.15      Home Exercise Plan   Plans to continue exercise at Home (comment)    Frequency Add 2 additional days to program exercise sessions.      Oxygen    Maintain Oxygen  Saturation 88% or higher             Nutrition:  Target Goals: Understanding of nutrition guidelines, daily intake of sodium 1500mg , cholesterol 200mg , calories 30% from fat and 7% or less from saturated fats, daily to have 5 or more servings of fruits and vegetables.  Biometrics:  Pre Biometrics - 12/17/22 1526       Pre Biometrics   Height 5' (1.524 m)    Weight 156 lb 8 oz (71 kg)    Waist Circumference 39 inches    Hip Circumference 42.5 inches    Waist to Hip Ratio 0.92 %    BMI (Calculated) 30.56    Grip Strength 14.6 kg              Nutrition Therapy Plan and Nutrition Goals:   Nutrition Assessments:  MEDIFICTS Score Key: >=70 Need to make dietary changes  40-70 Heart Healthy Diet <= 40 Therapeutic Level Cholesterol Diet  Flowsheet Row PULMONARY REHAB OTHER RESP ORIENTATION from 12/17/2022 in [facility] CARDIAC REHABILITATION  Picture Your Plate Total Score on Admission 28      Picture Your Plate Scores: <59 Unhealthy dietary pattern with much room for improvement. 41-50 Dietary pattern unlikely to meet recommendations for good health and room for improvement. 51-60 More healthful dietary pattern, with some room for improvement.  >60 Healthy dietary pattern, although there may be some specific behaviors that could be improved.    Nutrition Goals Re-Evaluation:  Nutrition Goals Re-Evaluation     Row Name 01/19/23 1342 02/09/23 1357           Goals   Nutrition Goal healthy eatting healthy eatting      Comment [name] [name] said that she does not have a  big appite. She is at [name] so she eats their dinning food. She said they have a lot of veggies and chicken. They give her a lot of food and she isnt able to eat it all. She also said that it is sometimes to salty and she cant eat things too salty. She does not eat a lot of sweets but will eat the ones the [facility] has with dinner. [name] [name] is doing well in rehab. She is still not a big eater.  She uses dining hall food at [facility] so not able to control all her food.  She does try to avoid things that are too salty and get in fruits and vegetbales.      Expected Outcome Short : coninue to eat adiquet amout of foods    long term: continue to choise healthy eating Short: Continue to eat enough Long: Continue to pick healthy options               Nutrition Goals Discharge (Final Nutrition Goals Re-Evaluation):  Nutrition Goals Re-Evaluation - 02/09/23 1357       Goals   Nutrition Goal healthy eatting    Comment Ruthe is doing well in rehab. She is still not a big eater.  She uses dining hall food at [facility] so not able to control all her food.  She does try to avoid things that are too salty and get in fruits and vegetbales.    Expected Outcome Short: Continue to eat enough Long: Continue to pick healthy options             Psychosocial: Target Goals: Acknowledge presence or absence of significant depression and/or stress, maximize coping skills, provide positive support system. Participant is able to verbalize types and ability to use techniques and skills needed for reducing stress and depression.  Initial Review & Psychosocial Screening:  Initial Psych Review & Screening - 12/17/22 1500       Initial Review   Current issues with Current Depression;Current Stress Concerns;Current Sleep Concerns    Source of Stress Concerns Unable to participate in former interests or hobbies;Chronic Illness      Family Dynamics   Good Support System? Yes   Patient's son and daughter are her  support people.     Barriers   Psychosocial barriers to participate in program The patient should benefit from training in stress management and relaxation.      Screening Interventions   Interventions Encouraged to exercise;To provide support and resources with identified psychosocial needs;Provide feedback about the scores to participant    Expected Outcomes Short Term goal: Utilizing psychosocial counselor, staff and physician to assist with identification of specific Stressors or current issues interfering with healing process. Setting desired goal for each stressor or current issue identified.;Long Term Goal: Stressors or current issues are controlled or eliminated.;Short Term goal: Identification and review with participant of any Quality of Life or Depression concerns found by scoring the questionnaire.;Long Term goal: The participant improves quality of Life and PHQ9 Scores as seen by post scores and/or verbalization of changes             Quality of Life Scores:  Scores of 19 and below usually indicate a poorer quality of life in these areas.  A difference of  2-3 points is a clinically meaningful difference.  A difference of 2-3 points in the total score of the Quality of Life Index has been associated with significant improvement in overall quality of life, self-image, physical symptoms, and general  health in studies assessing change in quality of life.   PHQ-9: Review Flowsheet       12/17/2022  Depression screen PHQ 2/9  Decreased Interest 3  Down, Depressed, Hopeless 1  PHQ - 2 Score 4  Altered sleeping 3  Tired, decreased energy 3  Change in appetite 2  Feeling bad or failure about yourself  1  Trouble concentrating 3  Moving slowly or fidgety/restless 0  Suicidal thoughts 0  PHQ-9 Score 16  Difficult doing work/chores Somewhat difficult   Interpretation of Total Score  Total Score Depression Severity:  1-4 = Minimal depression, 5-9 = Mild depression, 10-14 =  Moderate depression, 15-19 = Moderately severe depression, 20-27 = Severe depression   Psychosocial Evaluation and Intervention:  Psychosocial Evaluation - 12/17/22 1502       Psychosocial Evaluation & Interventions   Interventions Stress management education;Relaxation education;Encouraged to exercise with the program and follow exercise prescription    Comments Patient was referred to PR with pulmonary HTN. She was accompained today by her son and daughter. Her PHQ-9 score was 16 due to sleeping issues caused by nocturia, lack of interest in doing things, poor appepite and lack of energy. She was hospitalized in April with HF exacerbation and was discharged to SNF and then went to an assistive living facility in June. She says she has been down and depressed since moving into [place]. She wants to go back to her home and get back to living independently. She has never been treated for depression or anxiety and says she is not willing to be treated at this time. She is a retired [employer] from [employer]. She is hard of hearting but very alert and orientated. [name]. She says she has tried outpatient PT and was not able to complete the sessions due to SOB and fatigue so she is very skeptical that she will be able to participate in PR but is willing to try since her son and daughter want her to try. They both say she was not using O2 when she attempted PT and they feel she will be able to participate. They will provide transportation or transportation will be provided by [name]. Her son and daughter say her diuretic was increased  at her last visit to the HF clinic and since this she has been staying in her room not going to activities or to the dining room to eat like she was doing previously. They think this is mainly due to fear of her having to urinate and soiling herself. The patient says she has to go to the bathroom every 15 minutes and this is why she does not leave  her room. She does wear a pull-up in case she can't make it to the bathroom. She says her goals for the program are to breathe better and feel better overall. Her lack of motivation could be a barrier for her to participate in the program.    Expected Outcomes Short term: Attend sessions. Long Term: Breathe better and feel better overall.    Continue Psychosocial Services  Follow up required by staff             Psychosocial Re-Evaluation:  Psychosocial Re-Evaluation     Row Name 01/19/23 1334 02/09/23 1354           Psychosocial Re-Evaluation   Current issues with History of Depression;Current Stress Concerns History of Depression;Current Stress Concerns;Current Sleep Concerns      Comments  Tanekia stated that she is down about still living at [facility]. She is wanting to get back to living at her own home. She stated that her goals is to get home. She said that she did live by helself. She also stated that [facility] has been a nice place and everyone there has been treating her nicely. She is attending resident event [name] [name] is doing well in rehab.  She is still at [facility] and really wants to get back home.  They have not told her when she will be able to go home.  She knows she has to build up her independence again.  Her kids do not want her home by herself and she even admits that it would be hard for her to that still.  She still is not sleeping well with frequent trips to the bathroom.  She tries to rest as best as she can.      Expected Outcomes Short term: continue to attned resident events and get to know people    long term: continue to attned rehab and exercise to get to personal goals Short: Continue to work to build up independence again Long: Continue to exercise for mental boost and talk with kids.      Interventions Stress management education;Relaxation education --      Continue Psychosocial Services  Follow up required by staff --               Psychosocial Discharge  (Final Psychosocial Re-Evaluation):  Psychosocial Re-Evaluation - 02/09/23 1354       Psychosocial Re-Evaluation   Current issues with History of Depression;Current Stress Concerns;Current Sleep Concerns    Comments [name] is doing well in rehab.  She is still at [facility] and really wants to get back home.  They have not told her when she will be able to go home.  She knows she has to build up her independence again.  Her kids do not want her home by herself and she even admits that it would be hard for her to that still.  She still is not sleeping well with frequent trips to the bathroom.  She tries to rest as best as she can.    Expected Outcomes Short: Continue to work to build up independence again Long: Continue to exercise for mental boost and talk with kids.              Education: Education Goals: Education classes will be provided on a weekly basis, covering required topics. Participant will state understanding/return demonstration of topics presented.  Learning Barriers/Preferences:  Learning Barriers/Preferences - 12/17/22 1458       Learning Barriers/Preferences   Learning Barriers Hearing    Learning Preferences Written Material;Audio;Skilled Demonstration             Education Topics: How Lungs Work and Diseases: - Discuss the anatomy of the lungs and diseases that can affect the lungs, such as COPD.   Exercise: -Discuss the importance of exercise, FITT principles of exercise, normal and abnormal responses to exercise, and how to exercise safely.   Environmental Irritants: -Discuss types of environmental irritants and how to limit exposure to environmental irritants.   Meds/Inhalers and oxygen : - Discuss respiratory medications, definition of an inhaler and oxygen , and the proper way to use an inhaler and oxygen .   Energy Saving Techniques: - Discuss methods to conserve energy and decrease shortness of breath when performing activities of daily living.     Bronchial Hygiene / Breathing Techniques: -  Discuss breathing mechanics, pursed-lip breathing technique,  proper posture, effective ways to clear airways, and other functional breathing techniques   Cleaning Equipment: - Provides group verbal and written instruction about the health risks of elevated stress, cause of high stress, and healthy ways to reduce stress.   Nutrition I: Fats: - Discuss the types of cholesterol, what cholesterol does to the body, and how cholesterol levels can be controlled.   Nutrition II: Labels: -Discuss the different components of food labels and how to read food labels.   Respiratory Infections: - Discuss the signs and symptoms of respiratory infections, ways to prevent respiratory infections, and the importance of seeking medical treatment when having a respiratory infection.   Stress I: Signs and Symptoms: - Discuss the causes of stress, how stress may lead to anxiety and depression, and ways to limit stress. Flowsheet Row PULMONARY REHAB OTHER RESPIRATORY from 02/25/2023 in [facility]IAC REH[facility]ILITATION  Date 02/11/23  Educator hj  Instruction Review Code 1- Verbalizes Understanding       Stress II: Relaxation: -Discuss relaxation techniques to limit stress.   Oxygen  for Home/Travel: - Discuss how to prepare for travel when on oxygen  and proper ways to transport and store oxygen  to ensure safety.   Knowledge Questionnaire Score:  Knowledge Questionnaire Score - 12/17/22 1458       Knowledge Questionnaire Score   Pre Score 16/18             Core Components/Risk Factors/Patient Goals at Admission:  Personal Goals and Risk Factors at Admission - 12/17/22 1459       Core Components/Risk Factors/Patient Goals on Admission    Weight Management Yes;Obesity;Weight Loss    Intervention Weight Management: Develop a combined nutrition and exercise program designed to reach desired caloric intake, while maintaining appropriate  intake of nutrient and fiber, sodium and fats, and appropriate energy expenditure required for the weight goal.;Weight Management: Provide education and appropriate resources to help participant work on and attain dietary goals.;Weight Management/Obesity: Establish reasonable short term and long term weight goals.;Obesity: Provide education and appropriate resources to help participant work on and attain dietary goals.    Admit Weight 156 lb 8 oz (71 kg)    Goal Weight: Short Term 150 lb (68 kg)    Goal Weight: Long Term 150 lb (68 kg)    Expected Outcomes Short Term: Continue to assess and modify interventions until short term weight is achieved;Long Term: Adherence to nutrition and physical activity/exercise program aimed toward attainment of established weight goal;Weight Loss: Understanding of general recommendations for a balanced deficit meal plan, which promotes 1-2 lb weight loss per week and includes a negative energy balance of [id number] kcal/d;Understanding recommendations for meals to include 15-35% energy as protein, 25-35% energy from fat, 35-60% energy from carbohydrates, less than 200mg  of dietary cholesterol, 20-35 gm of total fiber daily;Understanding of distribution of calorie intake throughout the day with the consumption of 4-5 meals/snacks    Improve shortness of breath with ADL's Yes    Intervention Provide education, individualized exercise plan and daily activity instruction to help decrease symptoms of SOB with activities of daily living.    Expected Outcomes Short Term: Improve cardiorespiratory fitness to achieve a reduction of symptoms when performing ADLs;Long Term: Be able to perform more ADLs without symptoms or delay the onset of symptoms    Increase knowledge of respiratory medications and ability to use respiratory devices properly  Yes    Intervention Provide education and demonstration as needed of appropriate  use of medications, inhalers, and oxygen  therapy.    Expected  Outcomes Short Term: Achieves understanding of medications use. Understands that oxygen  is a medication prescribed by physician. Demonstrates appropriate use of inhaler and oxygen  therapy.;Long Term: Maintain appropriate use of medications, inhalers, and oxygen  therapy.    Heart Failure Yes    Intervention Provide a combined exercise and nutrition program that is supplemented with education, support and counseling about heart failure. Directed toward relieving symptoms such as shortness of breath, decreased exercise tolerance, and extremity edema.    Expected Outcomes Improve functional capacity of life;Short term: Attendance in program 2-3 days a week with increased exercise capacity. Reported lower sodium intake. Reported increased fruit and vegetable intake. Reports medication compliance.;Short term: Daily weights obtained and reported for increase. Utilizing diuretic protocols set by physician.;Long term: Adoption of self-care skills and reduction of barriers for early signs and symptoms recognition and intervention leading to self-care maintenance.    Hypertension Yes    Intervention Monitor prescription use compliance.;Provide education on lifestyle modifcations including regular physical activity/exercise, weight management, moderate sodium restriction and increased consumption of fresh fruit, vegetables, and low fat dairy, alcohol moderation, and smoking cessation.    Expected Outcomes Short Term: Continued assessment and intervention until BP is < 140/47mm HG in hypertensive participants. < 130/58mm HG in hypertensive participants with diabetes, heart failure or chronic kidney disease.;Long Term: Maintenance of blood pressure at goal levels.    Lipids Yes    Intervention Provide education and support for participant on nutrition & aerobic/resistive exercise along with prescribed medications to achieve LDL 70mg , HDL >40mg .    Expected Outcomes Short Term: Participant states understanding of desired  cholesterol values and is compliant with medications prescribed. Participant is following exercise prescription and nutrition guidelines.;Long Term: Cholesterol controlled with medications as prescribed, with individualized exercise RX and with personalized nutrition plan. Value goals: LDL < 70mg , HDL > 40 mg.             Core Components/Risk Factors/Patient Goals Review:   Goals and Risk Factor Review     Row Name 01/19/23 1346 02/09/23 1358           Core Components/Risk Factors/Patient Goals Review   Personal Goals Review Improve shortness of breath with ADL's;Weight Management/Obesity Improve shortness of breath with ADL's;Weight Management/Obesity;Heart Failure;Hypertension;Increase knowledge of respiratory medications and ability to use respiratory devices properly.      Review [name] [name] has been working on her weight managment but she does not eat much due to low appite. She does choose to eat healthy options at the living center. She is wanting to imrpvoe her SOB but has not seen an improvemnt yet. She does walk around [place] with help and attend classes to increase her endurance. She is also remebering to use PLB to help with her SOB when walking. Shivaun is doing well in rehab.  She keeps a close eye on her weight and it is staying steady for most part.  She does note that she still is retaining some fluid in her legs.  They have improved some.  She does not want more lasix  as she is already constantly going to bathroom.  Her breathing is still not too much better but feels that she can breathe okay.  Her pressures are still running low for her and she talked to Dr. Debera last week about it.  He told her is she gets symptomatic with it that he would lower her medications.  She wants to get better.  Expected Outcomes Short term: continue to use PLB for SOB   long term: continue to exericse for mantaince and improving endurance Short: Continue to keep close eye on blood pressures Long:  Continue to monitor heart failure symptoms               Core Components/Risk Factors/Patient Goals at Discharge (Final Review):   Goals and Risk Factor Review - 02/09/23 1358       Core Components/Risk Factors/Patient Goals Review   Personal Goals Review Improve shortness of breath with ADL's;Weight Management/Obesity;Heart Failure;Hypertension;Increase knowledge of respiratory medications and ability to use respiratory devices properly.    Review [name] [name] is doing well in rehab.  She keeps a close eye on her weight and it is staying steady for most part.  She does note that she still is retaining some fluid in her legs.  They have improved some.  She does not want more lasix  as she is already constantly going to bathroom.  Her breathing is still not too much better but feels that she can breathe okay.  Her pressures are still running low for her and she talked to Dr. Debera last week about it.  He told her is she gets symptomatic with it that he would lower her medications.  She wants to get better.    Expected Outcomes Short: Continue to keep close eye on blood pressures Long: Continue to monitor heart failure symptoms             ITP Comments:  ITP Comments     Row Name 12/24/22 1349 12/30/22 1518 01/27/23 1633 02/24/23 0819 03/16/23 1350   ITP Comments First full day of exercise!  Patient was oriented to gym and equipment including functions, settings, policies, and procedures.  Patient's individual exercise prescription and treatment plan were reviewed.  All starting workloads were established based on the results of the 6 minute walk test done at initial orientation visit.  The plan for exercise progression was also introduced and progression will be customized based on patient's performance and goals. 30 day review completed. ITP sent to Dr.Jehanzeb Memon, Medical Director of  Pulmonary Rehab. Continue with ITP unless changes are made by physician.  New to program 30 day review  completed. ITP sent to Dr.Jehanzeb Memon, Medical Director of  Pulmonary Rehab. Continue with ITP unless changes are made by physician. 30 day review completed. ITP sent to Dr.Jehanzeb Memon, Medical Director of  Pulmonary Rehab. Continue with ITP unless changes are made by physician. [name] [name] did not complete her rehab session.  [name] [name] was moving from her walker to her chair and passed out down to floor.  She hit her head and nose.  [facility] was called, she had lost consciousness but never stopped breathing or loss of pulse noticed.  She came back to and attempted to get up on own.  Staff assisted as [facility] [employer] arrived.  Team took over and transported to ED for CT of head.    Row Name 03/24/23 1152 03/24/23 1157 04/20/23 1437       ITP Comments [name] was [name] after episode was found to be a vfib arrest corrected by ICD. Her fall resulted in a nasal fracture and subdermal hematoma on head.  She has been discharged to SNF [name]).  Will continue to follow. 30 day review completed. ITP sent to Dr.Jehanzeb Memon, Medical Director of  Pulmonary Rehab. Continue with ITP unless changes are made by physician. 30 day review  completed. ITP sent to Dr.Jehanzeb Memon, Medical Director of  Pulmonary Rehab. Continue with ITP unless changes are made by physician. Patient has not attended since 03/02/23 with heart event in rehab on 03/16/23  that had her admitted from 03/16/23-03/23/23, then transferred to SNF at [facility].  She is now back at [facility] and has a follow up appt with cardiology on 04/28/23.  We will continue to follow for return to rehab.              Comments: 30 day review

## 2023-04-21 ENCOUNTER — Other Ambulatory Visit (HOSPITAL_COMMUNITY)
Admission: RE | Admit: 2023-04-21 | Discharge: 2023-04-21 | Disposition: A | Discharge status: Home / Self Care | Payer: Medicare PPO | Source: Skilled Nursing Facility | Attending: Adult Health | Admitting: Adult Health

## 2023-04-21 DIAGNOSIS — J90 Pleural effusion, not elsewhere classified: Secondary | ICD-10-CM | POA: Diagnosis not present

## 2023-04-21 DIAGNOSIS — R262 Difficulty in walking, not elsewhere classified: Secondary | ICD-10-CM | POA: Diagnosis not present

## 2023-04-21 DIAGNOSIS — J9610 Chronic respiratory failure, unspecified whether with hypoxia or hypercapnia: Secondary | ICD-10-CM | POA: Diagnosis not present

## 2023-04-21 DIAGNOSIS — R918 Other nonspecific abnormal finding of lung field: Secondary | ICD-10-CM | POA: Diagnosis not present

## 2023-04-21 DIAGNOSIS — I517 Cardiomegaly: Secondary | ICD-10-CM | POA: Diagnosis not present

## 2023-04-21 DIAGNOSIS — Z950 Presence of cardiac pacemaker: Secondary | ICD-10-CM | POA: Diagnosis not present

## 2023-04-21 DIAGNOSIS — S065X0S Traumatic subdural hemorrhage without loss of consciousness, sequela: Secondary | ICD-10-CM | POA: Diagnosis not present

## 2023-04-21 DIAGNOSIS — Z9181 History of falling: Secondary | ICD-10-CM | POA: Diagnosis not present

## 2023-04-21 DIAGNOSIS — I5032 Chronic diastolic (congestive) heart failure: Secondary | ICD-10-CM | POA: Insufficient documentation

## 2023-04-21 DIAGNOSIS — R55 Syncope and collapse: Secondary | ICD-10-CM | POA: Diagnosis not present

## 2023-04-21 DIAGNOSIS — M6281 Muscle weakness (generalized): Secondary | ICD-10-CM | POA: Diagnosis not present

## 2023-04-21 DIAGNOSIS — J069 Acute upper respiratory infection, unspecified: Secondary | ICD-10-CM | POA: Insufficient documentation

## 2023-04-21 DIAGNOSIS — R498 Other voice and resonance disorders: Secondary | ICD-10-CM | POA: Diagnosis not present

## 2023-04-21 DIAGNOSIS — R488 Other symbolic dysfunctions: Secondary | ICD-10-CM | POA: Diagnosis not present

## 2023-04-21 DIAGNOSIS — R0602 Shortness of breath: Secondary | ICD-10-CM | POA: Diagnosis not present

## 2023-04-21 LAB — COMPREHENSIVE METABOLIC PANEL
ALT: 22 U/L (ref 0–44)
AST: 29 U/L (ref 15–41)
Albumin: 3.8 g/dL (ref 3.5–5.0)
Alkaline Phosphatase: 77 U/L (ref 38–126)
Anion gap: 14 (ref 5–15)
BUN: 38 mg/dL — ABNORMAL HIGH (ref 8–23)
CO2: 29 mmol/L (ref 22–32)
Calcium: 9.3 mg/dL (ref 8.9–10.3)
Chloride: 97 mmol/L — ABNORMAL LOW (ref 98–111)
Creatinine, Ser: 1.83 mg/dL — ABNORMAL HIGH (ref 0.44–1.00)
GFR, Estimated: 26 mL/min — ABNORMAL LOW (ref >60)
Glucose, Bld: 147 mg/dL — ABNORMAL HIGH (ref 70–99)
Potassium: 3.8 mmol/L (ref 3.5–5.1)
Sodium: 140 mmol/L (ref 135–145)
Total Bilirubin: 1.7 mg/dL — ABNORMAL HIGH (ref 0.0–1.2)
Total Protein: 7.7 g/dL (ref 6.5–8.1)

## 2023-04-21 LAB — CBC
HCT: 38.8 % (ref 36.0–46.0)
Hemoglobin: 12.1 g/dL (ref 12.0–15.0)
MCH: 32.2 pg (ref 26.0–34.0)
MCHC: 31.2 g/dL (ref 30.0–36.0)
MCV: 103.2 fL — ABNORMAL HIGH (ref 80.0–100.0)
Platelets: 209 10*3/uL (ref 150–400)
RBC: 3.76 MIL/uL — ABNORMAL LOW (ref 3.87–5.11)
RDW: 17 % — ABNORMAL HIGH (ref 11.5–15.5)
WBC: 13.2 10*3/uL — ABNORMAL HIGH (ref 4.0–10.5)
nRBC: 0 % (ref 0.0–0.2)

## 2023-04-21 LAB — BRAIN NATRIURETIC PEPTIDE: B Natriuretic Peptide: 721 pg/mL — ABNORMAL HIGH (ref 0.0–100.0)

## 2023-04-21 LAB — D-DIMER, QUANTITATIVE: D-Dimer, Quant: 2.24 ug{FEU}/mL — ABNORMAL HIGH (ref 0.00–0.50)

## 2023-04-21 LAB — RESP PANEL BY RT-PCR (FLU A&B, COVID) ARPGX2
Influenza A by PCR: NEGATIVE
Influenza B by PCR: NEGATIVE
SARS Coronavirus 2 by RT PCR: NEGATIVE

## 2023-04-21 LAB — C-REACTIVE PROTEIN: CRP: 4.4 mg/dL — ABNORMAL HIGH (ref <1.0)

## 2023-04-22 ENCOUNTER — Non-Acute Institutional Stay (SKILLED_NURSING_FACILITY): Payer: Medicare PPO | Admitting: Internal Medicine

## 2023-04-22 ENCOUNTER — Encounter: Payer: Self-pay | Admitting: Internal Medicine

## 2023-04-22 ENCOUNTER — Encounter (HOSPITAL_COMMUNITY): Payer: Medicare PPO

## 2023-04-22 DIAGNOSIS — S065X0S Traumatic subdural hemorrhage without loss of consciousness, sequela: Secondary | ICD-10-CM | POA: Diagnosis not present

## 2023-04-22 DIAGNOSIS — I4891 Unspecified atrial fibrillation: Secondary | ICD-10-CM

## 2023-04-22 DIAGNOSIS — I5032 Chronic diastolic (congestive) heart failure: Secondary | ICD-10-CM | POA: Diagnosis not present

## 2023-04-22 DIAGNOSIS — R0602 Shortness of breath: Secondary | ICD-10-CM | POA: Diagnosis not present

## 2023-04-22 DIAGNOSIS — J9610 Chronic respiratory failure, unspecified whether with hypoxia or hypercapnia: Secondary | ICD-10-CM | POA: Diagnosis not present

## 2023-04-22 DIAGNOSIS — J189 Pneumonia, unspecified organism: Secondary | ICD-10-CM | POA: Diagnosis not present

## 2023-04-22 DIAGNOSIS — R262 Difficulty in walking, not elsewhere classified: Secondary | ICD-10-CM | POA: Diagnosis not present

## 2023-04-22 DIAGNOSIS — R488 Other symbolic dysfunctions: Secondary | ICD-10-CM | POA: Diagnosis not present

## 2023-04-22 DIAGNOSIS — Z9181 History of falling: Secondary | ICD-10-CM | POA: Diagnosis not present

## 2023-04-22 DIAGNOSIS — R498 Other voice and resonance disorders: Secondary | ICD-10-CM | POA: Diagnosis not present

## 2023-04-22 DIAGNOSIS — R55 Syncope and collapse: Secondary | ICD-10-CM | POA: Diagnosis not present

## 2023-04-22 DIAGNOSIS — M6281 Muscle weakness (generalized): Secondary | ICD-10-CM | POA: Diagnosis not present

## 2023-04-22 NOTE — Assessment & Plan Note (Addendum)
 Clinically rhythm is regular and rate adequately controlled. She is on Amiodarone  . Azithromycin  can interact with Amiodarone  , but it will be short course  for the significant HCAP, not prolonged or maintenance.  Despite the history of atherosclerosis , she denies any chest pain.

## 2023-04-22 NOTE — Assessment & Plan Note (Addendum)
 BNP is significantly elevated at 721.0; but chest x-ray does not suggest frank congestive heart failure even though significant cardiomegaly is present.  Tense edema persists and is unchanged.

## 2023-04-22 NOTE — Progress Notes (Signed)
 NURSING HOME LOCATION:  [facility] ROOM NUMBER:  [id number] P  CODE STATUS:  Full Code  PCP:  Barnie Seip NP  This is a nursing facility follow up visit for specific acute issue of RLL HCAP.  Interim medical record and care since last SNF visit was updated with review of diagnostic studies and change in clinical status since last visit were documented.  HPI: Portable chest x-ray was performed because of complaints of dyspnea.  The film was personally reviewed and reveals cardiomegaly and extensive infiltrate in the right lower lobe.  A left lower lobe medial infiltrate in the retrocardiac area cannot be ruled out.  There are also accentuated diffuse interstitial changes. D-dimer was 3.24; she is on Eliquis .  C-reactive protein was 4.4 with normals less than 1.0.  Influenza and COVID screenings were negative. She has a history of allergy to cephalexin  manifested as itching and rash.  She was placed on doxycycline . Other labs reveal significant elevation of the BNP with a value of 721.0.  Again the chest x-ray did not suggest acute CHF.  White count was 13,300; she does have chronic leukocytosis.  Anemia has resolved with current H/H of 12.1/38.8, up from prior values of 10.5/33.0.  She has developed macrocytosis with an MCV of 103.2.  Review of systems: She is cognizant that she has pneumonia and is on doxycycline .  She continues to have dyspnea.  She denies any upper respiratory tract symptoms.  There is no sputum production. She denies paroxysmal nocturnal dyspnea specifically but states that she sleeps poorly but does not want a sleeping pill because of risk of falling.  Chronic peripheral edema is unchanged.  Constitutional: No fever, significant weight change  Eyes: No redness, discharge, pain, vision change ENT/mouth: No nasal congestion,  purulent discharge, earache, change in hearing, sore throat  Cardiovascular: No chest pain, palpitations Respiratory: No hemoptysis,   significant snoring, apnea   Gastrointestinal: No heartburn, dysphagia, abdominal pain, nausea /vomiting, rectal bleeding, melena, change in bowels Genitourinary: No dysuria, hematuria, pyuria, incontinence, nocturia Dermatologic: No rash, pruritus, change in appearance of skin Neurologic: No dizziness, headache, syncope, seizures Psychiatric: No significant anxiety, depression, insomnia, anorexia Endocrine: No change in hair/skin/nails, excessive thirst, excessive hunger, excessive urination  Hematologic/lymphatic: No significant lymphadenopathy, abnormal bleeding Allergy/immunology: No itchy/watery eyes, significant sneezing, urticaria, angioedema  Physical exam:  Pertinent or positive findings: She was initially asleep exhibiting hypopnea without snoring or frank apnea.  She is on nasal oxygen .  Facies are weathered.  Bilateral ptosis is present, greater on the left.  Eyebrow density is decreased.  There is erythema of the uvula without exudate.  Breath sounds are markedly decreased.  Abdomen is protuberant.  She has tense edema of the lower extremities.  There is 1+ edema at the sock line.  Pedal pulses are not palpable.  There is subcutaneous swelling in the right infrapatellar area.  She has keratotic lesions of the right lateral malar area. Extensive ecchymoses present on forearms & hands dorsally.  General appearance: Adequately nourished; no acute distress, increased work of breathing is present.   Lymphatic: No lymphadenopathy about the head, neck, axilla. Eyes: No conjunctival inflammation or lid edema is present. There is no scleral icterus. Ears:  External ear exam shows no significant lesions or deformities.   Nose:  External nasal examination shows no deformity or inflammation. Nasal mucosa are pink and moist without lesions, exudates Oral exam:  Lips and gums are healthy appearing.  Neck:  No thyromegaly, masses,  tenderness noted.    Heart:  No gallop, murmur, click, rub .   Lungs: without wheezes, rhonchi, rales, rubs. Abdomen: Bowel sounds are normal. Abdomen is soft and nontender with no organomegaly, hernias, masses. GU: Deferred  Extremities:  No cyanosis, clubbing  Neurologic exam :Balance, Rhomberg, finger to nose testing could not be completed due to clinical state Skin: Warm & dry w/o tenting. No significant rash.  See summary under each active problem in the Problem List with associated updated therapeutic plan

## 2023-04-22 NOTE — Patient Instructions (Signed)
 See assessment and plan under each diagnosis in the problem list and acutely for this visit

## 2023-04-26 ENCOUNTER — Encounter (HOSPITAL_COMMUNITY): Payer: Self-pay

## 2023-04-26 DIAGNOSIS — I272 Pulmonary hypertension, unspecified: Secondary | ICD-10-CM

## 2023-04-27 DIAGNOSIS — I517 Cardiomegaly: Secondary | ICD-10-CM | POA: Diagnosis not present

## 2023-04-27 DIAGNOSIS — J90 Pleural effusion, not elsewhere classified: Secondary | ICD-10-CM | POA: Diagnosis not present

## 2023-04-27 DIAGNOSIS — J189 Pneumonia, unspecified organism: Secondary | ICD-10-CM | POA: Diagnosis not present

## 2023-04-27 DIAGNOSIS — R918 Other nonspecific abnormal finding of lung field: Secondary | ICD-10-CM | POA: Diagnosis not present

## 2023-04-27 DIAGNOSIS — Z950 Presence of cardiac pacemaker: Secondary | ICD-10-CM | POA: Diagnosis not present

## 2023-04-28 ENCOUNTER — Ambulatory Visit: Payer: Medicare PPO | Attending: Student | Admitting: Student

## 2023-04-28 ENCOUNTER — Encounter: Payer: Self-pay | Admitting: Student

## 2023-04-28 VITALS — BP 118/62 | HR 72 | Ht 60.0 in | Wt 164.4 lb

## 2023-04-28 DIAGNOSIS — I2510 Atherosclerotic heart disease of native coronary artery without angina pectoris: Secondary | ICD-10-CM

## 2023-04-28 DIAGNOSIS — E785 Hyperlipidemia, unspecified: Secondary | ICD-10-CM | POA: Diagnosis not present

## 2023-04-28 DIAGNOSIS — I5032 Chronic diastolic (congestive) heart failure: Secondary | ICD-10-CM | POA: Diagnosis not present

## 2023-04-28 DIAGNOSIS — I4729 Other ventricular tachycardia: Secondary | ICD-10-CM | POA: Diagnosis not present

## 2023-04-28 DIAGNOSIS — I272 Pulmonary hypertension, unspecified: Secondary | ICD-10-CM | POA: Diagnosis not present

## 2023-04-28 DIAGNOSIS — I480 Paroxysmal atrial fibrillation: Secondary | ICD-10-CM

## 2023-04-28 DIAGNOSIS — N1832 Chronic kidney disease, stage 3b: Secondary | ICD-10-CM

## 2023-04-28 MED ORDER — TORSEMIDE 20 MG PO TABS: 60.0000 mg | ORAL_TABLET | Freq: Two times a day (BID) | ORAL | 3 refills | Status: DC

## 2023-04-28 NOTE — Progress Notes (Signed)
 Cardiology Office Note    Date:  04/28/2023  ID:  Yvetta, Drotar 1931/11/09, MRN [id number] Cardiologist: Jayson Sierras, MD   EP: Dr. Waddell AHF: Dr. Cherrie  History of Present Illness:    [name] [name] is a 89 y.o. female with past medical history of CAD (s/p CABG in 1998 with LIMA-LAD, SVG-Diagonal, SVG-RI-OM, DES to PLA in 2006, low-risk NST in 09/2019), chronic HFpEF, pulmonary HTN, chronic hypoxic respiratory failure/COPD, paroxysmal atrial fibrillation, history of VT (s/p Medtronic ICD placement), HTN, HLD and Stage 3 CKD who presents to the office today for 46-month follow-up and hospital follow-up.  She was examined by Dr. Bensimhon in 02/2023 for HFpEF and pulmonary hypertension. Her volume status had improved and she was continued on Torsemide  100 mg in the AM/40 mg in PM along with Farxiga  10 mg daily. Given her soft BP, Losartan  was reduced to 25 mg daily and Bisoprolol  was discontinued. Follow-up labs did show her creatinine had trended up to 2.63 and K+ was at 5.8. She required Lokelma x 1 and Torsemide  was reduced to 100 mg daily.  In the interim, she presented [facility] 03/16/2023 after having a syncopal episode. Her ICD was interrogated and showed episodes of torsades which had progressed to VF and her ICD had fired. She was evaluated by EP with recommendations to stop Tikosyn  and begin Amiodarone . She did have a subdural hematoma due to her fall in the setting of syncope and Neurosurgery was consulted with recommendations for no surgical interventions. Was cleared by Neurosurgery to resume Eliquis  2.5 mg twice daily on 03/31/2023. Dr. Waddell did recommend that she continue Amiodarone  400 mg twice daily for 4 more days then 200 mg twice daily until follow-up.  In talking with the patient and her son today, she reports feeling weak while at SNF over the past few weeks as she was recently diagnosed with pneumonia and has been on antibiotic therapy  but did complete this. Reports having an occasional dry cough with no mucus production. Denies any specific chest pain or palpitations. Has baseline dyspnea on exertion and is on supplemental oxygen  24/7. No specific orthopnea or PND. She has experienced worsening lower extremity edema over the past few weeks despite taking Torsemide  100 mg daily.  Studies Reviewed:   EKG: EKG is not ordered today.  Echocardiogram: 02/2023 IMPRESSIONS     1. Left ventricular ejection fraction, by estimation, is 55 to 60%. The  left ventricle has normal function. Left ventricular endocardial border  not optimally defined to evaluate regional wall motion. Left ventricular  diastolic parameters are  indeterminate. There is the interventricular septum is flattened in  systole and diastole, consistent with right ventricular pressure and  volume overload.   2. Right ventricular systolic function mild to moderately reduced. The  right ventricular size is moderately enlarged. There is moderately  elevated pulmonary artery systolic pressure.   3. Left atrial size was mildly dilated.   4. Right atrial size was mildly dilated.   5. The mitral valve was not well visualized. No evidence of mitral valve  regurgitation. No evidence of mitral stenosis.   6. The tricuspid valve is abnormal.   7. The aortic valve is tricuspid. There is mild calcification of the  aortic valve. There is mild thickening of the aortic valve. Aortic valve  regurgitation is not visualized. No aortic stenosis is present.   8. The inferior vena cava is dilated in size with <50% respiratory  variability, suggesting right atrial pressure of 15 mmHg.    Risk Assessment/Calculations:    CHA2DS2-VASc Score = 6   This indicates a 9.7% annual risk of stroke. The patient's score is based upon: CHF History: 1 HTN History: 1 Diabetes History: 0 Stroke History: 0 Vascular Disease History: 1 Age Score: 2 Gender Score: 1    Physical Exam:    VS:  BP 118/62   Pulse 72   Ht 5' (1.524 m)   Wt 164 lb 6.4 oz (74.6 kg)   SpO2 99%   BMI 32.11 kg/m    Wt Readings from Last 3 Encounters:  04/28/23 164 lb 6.4 oz (74.6 kg)  04/22/23 163 lb 12.8 oz (74.3 kg)  04/09/23 161 lb (73 kg)     GEN: Pleasant, elderly female appearing in no acute distress. Sitting in wheelchair. NECK: No JVD; No carotid bruits CARDIAC: RRR, no murmurs, rubs, gallops RESPIRATORY:  Clear to auscultation without rales, wheezing or rhonchi  ABDOMEN: Appears non-distended. No obvious abdominal masses. EXTREMITIES: No clubbing or cyanosis. 1+ pitting edema up to mid-shins bilaterally.  Distal pedal pulses are 2+ bilaterally.   Assessment and Plan:   1. CAD/HLD - She underwent CABG in 1998 with DES to PLA in 2006 and most recent ischemic evaluation was a low-risk NST in 09/2019. She has baseline dyspnea on exertion in the setting of COPD and PAH but denies any recent chest pain. - Continue Atorvastatin  20 mg daily (LDL 44 in 02/2023) and Bisoprolol  5 mg daily. She is not on ASA given the need for anticoagulation.  2. Chronic HFpEF/Pulmonary HTN - She has been followed by the Advanced Heart Failure clinic and pulmonary hypertension is felt to be Group 2/3, therefore not a candidate for vasodilators. She was previously not interested in invasive procedures such as RHC and it was felt this would not change the management strategy. -She does have at least 1+ pitting edema on examination today and reports worsening dyspnea on exertion with this in the setting of recent pneumonia. Given her volume overload, will increase Torsemide  from 100 mg daily to 60 mg twice daily (at breakfast and lunch). Repeat BNP and BMET in 7-10 days. Continue Farxiga  10 mg daily.  - She had previously been on Losartan  and this was discontinued during admission given hypotension. BP is at 118/62 during today's visit but will hold off on restarting Losartan  for now pending reassessment of her  renal function and BP trend.   3. VT - She does have a Medtronic ICD in place and was followed by EP during her recent admission for VF with ICD shock.Remains on Amiodarone  200 mg twice daily for now. She does have follow-up with Dr. Waddell scheduled for later this month and will continue current dosing for now.  4. Paroxysmal Atrial Fibrillation - Previously on Tikosyn  but this was stopped during her admission given VF and ICD shock with initiation of Amiodarone . Continue current medication regimen for now with Amiodarone  200 mg twice daily and Bisoprolol  5 mg daily. - She has been restarted on Eliquis  2.5 mg twice daily for anticoagulation which is the appropriate dose at this time given her age and kidney function.  5. Stage 3-4 CKD - Baseline creatinine 1.8 - 1.9. Stable at 1.83 when checked on 04/21/2023. Will recheck a BMET in 2 weeks following medication changes.   Signed, Laymon CHRISTELLA Qua, PA-C

## 2023-04-28 NOTE — Patient Instructions (Addendum)
 Medication Instructions:  Your physician has recommended you make the following change in your medication:   -Increase Torsemide  to 60 mg twice daily- morning and lunch  *If you need a refill on your cardiac medications before your next appointment, please call your pharmacy*   Lab Work: In 2 weeks:  -BMP -BNP  If you have labs (blood work) drawn today and your tests are completely normal, you will receive your results only by: MyChart Message (if you have MyChart) OR A paper copy in the mail If you have any lab test that is abnormal or we need to change your treatment, we will call you to review the results.   Testing/Procedures: None   Follow-Up: At [facility], you and your health needs are our priority.  As part of our continuing mission to provide you with exceptional heart care, we have created designated Provider Care Teams.  These Care Teams include your primary Cardiologist (physician) and Advanced Practice Providers (APPs -  Physician Assistants and Nurse Practitioners) who all work together to provide you with the care you need, when you need it.  We recommend signing up for the patient portal called MyChart.  Sign up information is provided on this After Visit Summary.  MyChart is used to connect with patients for Virtual Visits (Telemedicine).  Patients are able to view lab/test results, encounter notes, upcoming appointments, etc.  Non-urgent messages can be sent to your provider as well.   To learn more about what you can do with MyChart, go to [email or web address].    Your next appointment:   2-3 month(s)  Provider:   You may see Jayson Sierras, MD or one of the following Advanced Practice Providers on your designated Care Team:   Laymon Qua, [place]   Other Instructions

## 2023-04-29 ENCOUNTER — Encounter (HOSPITAL_COMMUNITY): Payer: Self-pay | Admitting: *Deleted

## 2023-04-29 ENCOUNTER — Encounter (HOSPITAL_COMMUNITY): Payer: Medicare PPO

## 2023-04-29 DIAGNOSIS — I272 Pulmonary hypertension, unspecified: Secondary | ICD-10-CM

## 2023-04-29 NOTE — Progress Notes (Signed)
 Pulmonary Individual Treatment Plan  Patient Details  Name: SAHVANNAH RIESER MRN: [id number] Date of Birth: 24-Sep-1931 Referring Provider:   Flowsheet Row PULMONARY REHAB OTHER RESP ORIENTATION from 12/17/2022 in [facility] CARDIAC REHABILITATION  Referring Provider Darlean Ade MD       Initial Encounter Date:  Flowsheet Row PULMONARY REHAB OTHER RESP ORIENTATION from 12/17/2022 in [facility] CARDIAC REHABILITATION  Date 12/17/22       Visit Diagnosis: Pulmonary hypertension (HCC)  Patient's Home Medications on Admission:   Current Outpatient Medications:    acetaminophen  (TYLENOL ) 325 MG tablet, Take 2 tablets (650 mg total) by mouth every 6 (six) hours as needed for mild pain (or Fever >/= 101)., Disp: 100 tablet, Rfl: 2   albuterol  (VENTOLIN  HFA) 108 (90 Base) MCG/ACT inhaler, Inhale 2 puffs into the lungs every 6 (six) hours as needed for wheezing or shortness of breath., Disp: 18 g, Rfl: 2   amiodarone  (PACERONE ) 200 MG tablet, Take 200 mg by mouth 2 (two) times daily., Disp: , Rfl:    apixaban  (ELIQUIS ) 2.5 MG TABS tablet, Take 1 tablet (2.5 mg total) by mouth 2 (two) times daily., Disp: , Rfl:    atorvastatin  (LIPITOR) 20 MG tablet, Take 1 tablet (20 mg total) by mouth daily., Disp: 90 tablet, Rfl: 3   bisoprolol  (ZEBETA ) 5 MG tablet, Take 1 tablet (5 mg total) by mouth daily., Disp: , Rfl:    Calcium  Carbonate-Vitamin D (CALCIUM  600 + D PO), Take 1 tablet by mouth 2 (two) times daily., Disp: , Rfl:    dapagliflozin  propanediol (FARXIGA ) 10 MG TABS tablet, Take 1 tablet (10 mg total) by mouth daily before breakfast., Disp: 30 tablet, Rfl: 6   Fluticasone -Umeclidin-Vilant (TRELEGY ELLIPTA ) 100-62.5-25 MCG/ACT AEPB, Inhale 1 puff into the lungs daily., Disp: 60 each, Rfl: 6   folic acid  (FOLVITE ) 1 MG tablet, Take 1 tablet (1 mg total) by mouth daily., Disp: 30 tablet, Rfl: 3   methocarbamol  (ROBAXIN ) 500 MG tablet, Take 1 tablet (500 mg total) by mouth every 6 (six) hours as  needed for muscle spasms., Disp: , Rfl:    Polyethyl Glycol-Propyl Glycol (SYSTANE OP), Place 1 drop into both eyes daily as needed (Dry eyes)., Disp: , Rfl:    polyethylene glycol (MIRALAX  / GLYCOLAX ) 17 g packet, Take 17 g by mouth daily as needed for mild constipation., Disp: 14 each, Rfl: 0   torsemide  (DEMADEX ) 20 MG tablet, Take 3 tablets (60 mg total) by mouth 2 (two) times daily., Disp: 540 tablet, Rfl: 3  Past Medical History: Past Medical History:  Diagnosis Date   Anxiety    Asthma    Chronic atrial fibrillation (HCC)    Chronic back pain    Chronic diastolic heart failure (HCC)    Chronic obstructive pulmonary disease, unspecified (HCC)    Complete heart block (HCC)    COPD (chronic obstructive pulmonary disease) (HCC)    Coronary atherosclerosis of native coronary artery    a. s/p CABG in 1998 with LIMA-LAD, SVG-Diagonal, SVG-RI-OM b. DES to PLA in 2006   DDD (degenerative disc disease), lumbar    Essential hypertension    Headache    ICD (implantable cardioverter-defibrillator) in place    Mixed hyperlipidemia    Osteoarthritis    Ventricular fibrillation (HCC) 2003   a. seen on PPM interrogation a/w syncope    Tobacco Use: Social History   Tobacco Use  Smoking Status Never  Smokeless Tobacco Never    Labs: Review Flowsheet  More  data may exist      Latest Ref Rng & Units 03/08/2007 03/23/2008 08/29/2009 01/24/2013 06/14/2022  Labs for ITP Cardiac and Pulmonary Rehab  Cholestrol 0 - 200 mg/dL 884  895  881  888  -  LDL (calc) 0 - 99 mg/dL 58  56  58  56  -  HDL-C >39 mg/dL 72.2  71.9  36  31  -  Trlycerides <150 mg/dL 852  898  877  880  -  Bicarbonate 20.0 - 28.0 mmol/L - - - - 34.2   O2 Saturation % - - - - 53.0     Capillary Blood Glucose: No results found for: GLUCAP   Pulmonary Assessment Scores:  Pulmonary Assessment Scores     Row Name 12/17/22 1456         ADL UCSD   ADL Phase Entry     SOB Score total 75     Rest 0     Walk 3      Stairs 5     Bath 3     Dress 3     Shop 4       CAT Score   CAT Score 26       mMRC Score   mMRC Score 3             UCSD: Self-administered rating of dyspnea associated with activities of daily living (ADLs) 6-point scale (0 = not at all to 5 = maximal or unable to do because of breathlessness)  Scoring Scores range from 0 to 120.  Minimally important difference is 5 units  CAT: CAT can identify the health impairment of COPD patients and is better correlated with disease progression.  CAT has a scoring range of zero to 40. The CAT score is classified into four groups of low (less than 10), medium (10 - 20), high (21-30) and very high (31-40) based on the impact level of disease on health status. A CAT score over 10 suggests significant symptoms.  A worsening CAT score could be explained by an exacerbation, poor medication adherence, poor inhaler technique, or progression of COPD or comorbid conditions.  CAT MCID is 2 points  mMRC: mMRC (Modified Medical Research Council) Dyspnea Scale is used to assess the degree of baseline functional disability in patients of respiratory disease due to dyspnea. No minimal important difference is established. A decrease in score of 1 point or greater is considered a positive change.   Pulmonary Function Assessment:   Exercise Target Goals: Exercise Program Goal: Individual exercise prescription set using results from initial 6 min walk test and THRR while considering  patient's activity barriers and safety.   Exercise Prescription Goal: Initial exercise prescription builds to 30-45 minutes a day of aerobic activity, 2-3 days per week.  Home exercise guidelines will be given to patient during program as part of exercise prescription that the participant will acknowledge.  Activity Barriers & Risk Stratification:  Activity Barriers & Cardiac Risk Stratification - 12/17/22 1524       Activity Barriers & Cardiac Risk Stratification    Activity Barriers Deconditioning;Muscular Weakness;Back Problems;Arthritis;Shortness of Breath;Assistive Device;Balance Concerns             6 Minute Walk:  6 Minute Walk     Row Name 12/17/22 1516         6 Minute Walk   Phase Initial     Distance 244 feet     Walk Time 2.83 minutes     #  of Rest Breaks 1  2:10     MPH 0.97     RPE 15     Perceived Dyspnea  3     Symptoms Yes (comment)     Comments SOB     Resting HR 63 bpm     Resting BP 92/48     Resting Oxygen  Saturation  92 %     Exercise Oxygen  Saturation  during 6 min walk 84 %     Max Ex. HR 87 bpm     Max Ex. BP 138/82     2 Minute Post BP 134/70       Interval HR   1 Minute HR 66     2 Minute HR 68     3 Minute HR 67     4 Minute HR 63     5 Minute HR 63     6 Minute HR 87     2 Minute Post HR 64     Interval Heart Rate? Yes       Interval Oxygen    Interval Oxygen ? Yes     Baseline Oxygen  Saturation % 92 %     1 Minute Oxygen  Saturation % 87 %     1 Minute Liters of Oxygen  3 L  pulsed     2 Minute Oxygen  Saturation % 84 %     2 Minute Liters of Oxygen  3 L     3 Minute Oxygen  Saturation % 91 %     3 Minute Liters of Oxygen  3 L     4 Minute Oxygen  Saturation % 92 %     4 Minute Liters of Oxygen  3 L     5 Minute Oxygen  Saturation % 93 %     5 Minute Liters of Oxygen  3 L     6 Minute Oxygen  Saturation % 92 %     6 Minute Liters of Oxygen  3 L     2 Minute Post Oxygen  Saturation % 87 %     2 Minute Post Liters of Oxygen  3 L              Oxygen  Initial Assessment:  Oxygen  Initial Assessment - 12/17/22 1455       Home Oxygen    Home Oxygen  Device Home Concentrator;E-Tanks    Sleep Oxygen  Prescription Continuous    Liters per minute 2    Home Resting Oxygen  Prescription Continuous    Liters per minute 2    Compliance with Home Oxygen  Use Yes      Intervention   Short Term Goals To learn and exhibit compliance with exercise, home and travel O2 prescription;To learn and understand  importance of monitoring SPO2 with pulse oximeter and demonstrate accurate use of the pulse oximeter.;To learn and understand importance of maintaining oxygen  saturations>88%;To learn and demonstrate proper pursed lip breathing techniques or other breathing techniques. ;To learn and demonstrate proper use of respiratory medications    Long  Term Goals Exhibits compliance with exercise, home  and travel O2 prescription;Verbalizes importance of monitoring SPO2 with pulse oximeter and return demonstration;Maintenance of O2 saturations>88%;Exhibits proper breathing techniques, such as pursed lip breathing or other method taught during program session;Compliance with respiratory medication;Demonstrates proper use of MDI's             Oxygen  Re-Evaluation:  Oxygen  Re-Evaluation     Row Name 12/24/22 1350 01/19/23 1348 02/09/23 1401         Program Oxygen  Prescription   Program Oxygen  Prescription --  Continuous Continuous     Liters per minute -- 3 3       Home Oxygen    Home Oxygen  Device -- Home Concentrator;E-Tanks Home Concentrator;E-Tanks     Sleep Oxygen  Prescription -- Continuous Continuous     Liters per minute -- 2 2     Home Exercise Oxygen  Prescription -- Continuous Continuous     Liters per minute -- 3 3     Home Resting Oxygen  Prescription -- Continuous Continuous     Liters per minute -- 2 2     Compliance with Home Oxygen  Use -- Yes Yes       Goals/Expected Outcomes   Short Term Goals To learn and demonstrate proper pursed lip breathing techniques or other breathing techniques.  To learn and demonstrate proper pursed lip breathing techniques or other breathing techniques.  To learn and demonstrate proper pursed lip breathing techniques or other breathing techniques. ;To learn and understand importance of maintaining oxygen  saturations>88%;To learn and understand importance of monitoring SPO2 with pulse oximeter and demonstrate accurate use of the pulse oximeter.;To learn and  exhibit compliance with exercise, home and travel O2 prescription;To learn and demonstrate proper use of respiratory medications     Long  Term Goals Exhibits proper breathing techniques, such as pursed lip breathing or other method taught during program session Exhibits proper breathing techniques, such as pursed lip breathing or other method taught during program session Exhibits compliance with exercise, home  and travel O2 prescription;Verbalizes importance of monitoring SPO2 with pulse oximeter and return demonstration;Exhibits proper breathing techniques, such as pursed lip breathing or other method taught during program session;Maintenance of O2 saturations>88%;Compliance with respiratory medication;Demonstrates proper use of MDI's     Comments Reviewed PLB technique with pt.  Talked about how it works and it's importance in maintaining their exercise saturations. Darrelyn stated that she does have to take breaks when walking due to her SOB. She said that since starting she has not noticed improvement in her SOB. Azelie is doing well in rehab. She is good about wearing her oxygen  and finds it helpful.  However, she has noted that sometimes when she wakes up at night it has come off and she will put it back on.  She is doing well with her inhalers.  She is still working on her PLB and finds it helpful and tries to remember to do it.     Goals/Expected Outcomes Short: Become more profiecient at using PLB.   Long: Become independent at using PLB. Short: continue to use PLB for SOB   long term:continue to exericse for imrpovment and compliance with oxygen  Short: Conitue to work on PLB at home Long: Conitnue to work on breathing              Oxygen  Discharge (Final Oxygen  Re-Evaluation):  Oxygen  Re-Evaluation - 02/09/23 1401       Program Oxygen  Prescription   Program Oxygen  Prescription Continuous    Liters per minute 3      Home Oxygen    Home Oxygen  Device Home Concentrator;E-Tanks    Sleep  Oxygen  Prescription Continuous    Liters per minute 2    Home Exercise Oxygen  Prescription Continuous    Liters per minute 3    Home Resting Oxygen  Prescription Continuous    Liters per minute 2    Compliance with Home Oxygen  Use Yes      Goals/Expected Outcomes   Short Term Goals To learn and demonstrate proper pursed lip breathing techniques or  other breathing techniques. ;To learn and understand importance of maintaining oxygen  saturations>88%;To learn and understand importance of monitoring SPO2 with pulse oximeter and demonstrate accurate use of the pulse oximeter.;To learn and exhibit compliance with exercise, home and travel O2 prescription;To learn and demonstrate proper use of respiratory medications    Long  Term Goals Exhibits compliance with exercise, home  and travel O2 prescription;Verbalizes importance of monitoring SPO2 with pulse oximeter and return demonstration;Exhibits proper breathing techniques, such as pursed lip breathing or other method taught during program session;Maintenance of O2 saturations>88%;Compliance with respiratory medication;Demonstrates proper use of MDI's    Comments Kallee is doing well in rehab. She is good about wearing her oxygen  and finds it helpful.  However, she has noted that sometimes when she wakes up at night it has come off and she will put it back on.  She is doing well with her inhalers.  She is still working on her PLB and finds it helpful and tries to remember to do it.    Goals/Expected Outcomes Short: Conitue to work on PLB at home Long: Conitnue to work on breathing             Initial Exercise Prescription:  Initial Exercise Prescription - 12/17/22 1500       Date of Initial Exercise RX and Referring Provider   Date 12/17/22    Referring Provider Darlean Ade MD      Oxygen    Oxygen  Continuous    Liters 3    Maintain Oxygen  Saturation 88% or higher      NuStep   Level 1    SPM 80    Minutes 30    METs 1.5       Prescription Details   Frequency (times per week) 2    Duration Progress to 30 minutes of continuous aerobic without signs/symptoms of physical distress      Intensity   THRR 40-80% of Max Heartrate 90-118    Ratings of Perceived Exertion 11-13    Perceived Dyspnea 0-4      Progression   Progression Continue to progress workloads to maintain intensity without signs/symptoms of physical distress.      Resistance Training   Training Prescription Yes    Weight 2 lb    Reps 10-15             Perform Capillary Blood Glucose checks as needed.  Exercise Prescription Changes:   Exercise Prescription Changes     Row Name 12/17/22 1500 12/24/22 1400 01/19/23 1300 02/09/23 1400 02/18/23 1500     Response to Exercise   Blood Pressure (Admit) 92/48 106/50 92/60 -- 120/60   Blood Pressure (Exercise) 134/82 110/60 -- -- --   Blood Pressure (Exit) 134/70 106/62 124/64 -- 102/66   Heart Rate (Admit) 63 bpm 66 bpm 68 bpm -- 64 bpm   Heart Rate (Exercise) 87 bpm 69 bpm 98 bpm -- 64 bpm   Heart Rate (Exit) 64 bpm 65 bpm 91 bpm -- 61 bpm   Oxygen  Saturation (Admit) 92 % 94 % 94 % -- 98 %   Oxygen  Saturation (Exercise) 84 % 95 % 88 % -- 95 %   Oxygen  Saturation (Exit) 87 % 98 % 98 % -- 100 %   Rating of Perceived Exertion (Exercise) 15 13 12  -- 12   Perceived Dyspnea (Exercise) 3 1 2  -- 3   Symptoms SOB, fatigue -- -- -- --   Comments walk test results -- -- -- --  Duration -- Continue with 30 min of aerobic exercise without signs/symptoms of physical distress. Continue with 30 min of aerobic exercise without signs/symptoms of physical distress. -- Continue with 30 min of aerobic exercise without signs/symptoms of physical distress.   Intensity -- THRR unchanged THRR unchanged -- THRR unchanged     Progression   Progression -- Continue to progress workloads to maintain intensity without signs/symptoms of physical distress. Continue to progress workloads to maintain intensity without  signs/symptoms of physical distress. -- Continue to progress workloads to maintain intensity without signs/symptoms of physical distress.     Resistance Training   Training Prescription -- Yes Yes -- Yes   Weight -- 2 2 lbs / yellow band -- 2 lbs / yellow   Reps -- 10-15 10-15 -- 10-15     Oxygen    Oxygen  -- Continuous Continuous -- Continuous   Liters -- 3 3 -- 3     NuStep   Level -- 1 2 -- 1   SPM -- 48 49 -- 56   Minutes -- 30 30 -- 30   METs -- 1.6 1.6 -- 1.7     Home Exercise Plan   Plans to continue exercise at -- -- -- Home (comment)  walking Home (comment)   Frequency -- -- -- Add 2 additional days to program exercise sessions. Add 2 additional days to program exercise sessions.   Initial Home Exercises Provided -- -- -- 02/09/23 --     Oxygen    Maintain Oxygen  Saturation -- 88% or higher 88% or higher -- 88% or higher    Row Name 03/02/23 1500             Response to Exercise   Blood Pressure (Admit) 122/62       Blood Pressure (Exit) 122/62       Heart Rate (Admit) 63 bpm       Heart Rate (Exercise) 70 bpm       Heart Rate (Exit) 61 bpm       Oxygen  Saturation (Admit) 95 %       Oxygen  Saturation (Exercise) 90 %       Oxygen  Saturation (Exit) 91 %       Rating of Perceived Exertion (Exercise) 13       Perceived Dyspnea (Exercise) 2       Duration Continue with 30 min of aerobic exercise without signs/symptoms of physical distress.       Intensity THRR unchanged         Progression   Progression Continue to progress workloads to maintain intensity without signs/symptoms of physical distress.         Resistance Training   Training Prescription Yes       Weight 2 lbs       Reps 10-15         Oxygen    Oxygen  Continuous       Liters 2         NuStep   Level 2       SPM 53       Minutes 30       METs 2.15         Home Exercise Plan   Plans to continue exercise at Home (comment)       Frequency Add 2 additional days to program exercise sessions.          Oxygen    Maintain Oxygen  Saturation 88% or higher  Exercise Comments:   Exercise Comments     Row Name 12/24/22 1349 04/16/23 0816         Exercise Comments First full day of exercise!  Patient was oriented to gym and equipment including functions, settings, policies, and procedures.  Patient's individual exercise prescription and treatment plan were reviewed.  All starting workloads were established based on the results of the 6 minute walk test done at initial orientation visit.  The plan for exercise progression was also introduced and progression will be customized based on patient's performance and goals. Shaneese is on medical hold due to resting medical episode               Exercise Goals and Review:   Exercise Goals     Row Name 12/17/22 1526             Exercise Goals   Increase Physical Activity Yes       Intervention Provide advice, education, support and counseling about physical activity/exercise needs.;Develop an individualized exercise prescription for aerobic and resistive training based on initial evaluation findings, risk stratification, comorbidities and participant's personal goals.       Expected Outcomes Short Term: Attend rehab on a regular basis to increase amount of physical activity.;Long Term: Exercising regularly at least 3-5 days a week.;Long Term: Add in home exercise to make exercise part of routine and to increase amount of physical activity.       Increase Strength and Stamina Yes       Intervention Provide advice, education, support and counseling about physical activity/exercise needs.;Develop an individualized exercise prescription for aerobic and resistive training based on initial evaluation findings, risk stratification, comorbidities and participant's personal goals.       Expected Outcomes Short Term: Increase workloads from initial exercise prescription for resistance, speed, and METs.;Short Term: Perform resistance  training exercises routinely during rehab and add in resistance training at home;Long Term: Improve cardiorespiratory fitness, muscular endurance and strength as measured by increased METs and functional capacity ( )       Able to understand and use rate of perceived exertion (RPE) scale Yes       Intervention Provide education and explanation on how to use RPE scale       Expected Outcomes Short Term: Able to use RPE daily in rehab to express subjective intensity level;Long Term:  Able to use RPE to guide intensity level when exercising independently       Able to understand and use Dyspnea scale Yes       Intervention Provide education and explanation on how to use Dyspnea scale       Expected Outcomes Short Term: Able to use Dyspnea scale daily in rehab to express subjective sense of shortness of breath during exertion;Long Term: Able to use Dyspnea scale to guide intensity level when exercising independently       Knowledge and understanding of Target Heart Rate Range (THRR) Yes       Intervention Provide education and explanation of THRR including how the numbers were predicted and where they are located for reference       Expected Outcomes Short Term: Able to state/look up THRR;Short Term: Able to use daily as guideline for intensity in rehab;Long Term: Able to use THRR to govern intensity when exercising independently       Able to check pulse independently Yes       Intervention Provide education and demonstration on how to check pulse in carotid and radial arteries.;Review  the importance of being able to check your own pulse for safety during independent exercise       Expected Outcomes Short Term: Able to explain why pulse checking is important during independent exercise;Long Term: Able to check pulse independently and accurately       Understanding of Exercise Prescription Yes       Intervention Provide education, explanation, and written materials on patient's individual exercise  prescription       Expected Outcomes Short Term: Able to explain program exercise prescription;Long Term: Able to explain home exercise prescription to exercise independently                Exercise Goals Re-Evaluation :  Exercise Goals Re-Evaluation     Row Name 12/24/22 1349 01/19/23 1331 01/20/23 1327 02/09/23 1353 03/03/23 1015     Exercise Goal Re-Evaluation   Exercise Goals Review Able to understand and use rate of perceived exertion (RPE) scale;Knowledge and understanding of Target Heart Rate Range (THRR);Understanding of Exercise Prescription;Able to understand and use Dyspnea scale Increase Physical Activity;Increase Strength and Stamina Increase Physical Activity;Increase Strength and Stamina;Understanding of Exercise Prescription Increase Physical Activity;Increase Strength and Stamina;Understanding of Exercise Prescription Increase Physical Activity;Able to understand and use rate of perceived exertion (RPE) scale;Understanding of Exercise Prescription   Comments Reviewed RPE  and dyspnea scale, THR and program prescription with pt today.  Pt voiced understanding and was given a copy of goals to take home. Damica has been doing well in rehab. At the start she did not think she was going to be able to do rehab but she is proud that she has been able to do the NuStep. She said that she has not noticed an increase in her energy level or SOB. She continues to do well on the nustep and is on level 2. Maydell is doing great in rehab and tolerating exercise well. She has increased her level on the NuStep to level 2. She is exercising at an RPE of 12. Will continue to monitor and progress as able. Temica is doing well in rehab.  She has not noticed much improvement in her breathing but she does have more stamina.  She is walking some at home already.  She wants to get better.  We have also noticed that she no longer needs as many rest breaks in rehab. Reviewed home exercise with pt today.  Pt plans  to walk at home for exercise.  Reviewed THR, pulse, RPE, sign and symptoms, pulse oximetery and when to call 911 or MD.  Also discussed weather considerations and indoor options.  Pt voiced understanding. --   Expected Outcomes Short: Use RPE daily to regulate intensity.  Long: Follow program prescription in THR. Short: continue to increase levels when able    long term: continue to attend rehab Short: increase to level 3 for one half in the next two weeks    long term: continue to monitor and progress as able, Short: Continue to add exercise at home Long: continue to improve stamina --    Row Name 03/03/23 1016 04/16/23 0816 04/26/23 0829         Exercise Goal Re-Evaluation   Exercise Goals Review Increase Physical Activity;Able to understand and use rate of perceived exertion (RPE) scale;Understanding of Exercise Prescription -- --     Comments Aryonna is doing well in rehab. She is deconditioned but has seen improvment in her stamina. Shhe is on level 2 on the nustep. Will continue tp montior and progress  as able Amando is on medical hold due to resting medical episode. Will update when able Ellisyn is on medical hold due to resting medical episode. Will update when able     Expected Outcomes short : increase to level 3 in the next two weeks   long term: continue to attend rehab. -- --              Discharge Exercise Prescription (Final Exercise Prescription Changes):  Exercise Prescription Changes - 03/02/23 1500       Response to Exercise   Blood Pressure (Admit) 122/62    Blood Pressure (Exit) 122/62    Heart Rate (Admit) 63 bpm    Heart Rate (Exercise) 70 bpm    Heart Rate (Exit) 61 bpm    Oxygen  Saturation (Admit) 95 %    Oxygen  Saturation (Exercise) 90 %    Oxygen  Saturation (Exit) 91 %    Rating of Perceived Exertion (Exercise) 13    Perceived Dyspnea (Exercise) 2    Duration Continue with 30 min of aerobic exercise without signs/symptoms of physical distress.    Intensity THRR  unchanged      Progression   Progression Continue to progress workloads to maintain intensity without signs/symptoms of physical distress.      Resistance Training   Training Prescription Yes    Weight 2 lbs    Reps 10-15      Oxygen    Oxygen  Continuous    Liters 2      NuStep   Level 2    SPM 53    Minutes 30    METs 2.15      Home Exercise Plan   Plans to continue exercise at Home (comment)    Frequency Add 2 additional days to program exercise sessions.      Oxygen    Maintain Oxygen  Saturation 88% or higher             Nutrition:  Target Goals: Understanding of nutrition guidelines, daily intake of sodium 1500mg , cholesterol 200mg , calories 30% from fat and 7% or less from saturated fats, daily to have 5 or more servings of fruits and vegetables.  Biometrics:  Pre Biometrics - 12/17/22 1526       Pre Biometrics   Height 5' (1.524 m)    Weight 156 lb 8 oz (71 kg)    Waist Circumference 39 inches    Hip Circumference 42.5 inches    Waist to Hip Ratio 0.92 %    BMI (Calculated) 30.56    Grip Strength 14.6 kg              Nutrition Therapy Plan and Nutrition Goals:   Nutrition Assessments:  MEDIFICTS Score Key: >=70 Need to make dietary changes  40-70 Heart Healthy Diet <= 40 Therapeutic Level Cholesterol Diet  Flowsheet Row PULMONARY REHAB OTHER RESP ORIENTATION from 12/17/2022 in [facility] CARDIAC REHABILITATION  Picture Your Plate Total Score on Admission 28      Picture Your Plate Scores: <59 Unhealthy dietary pattern with much room for improvement. 41-50 Dietary pattern unlikely to meet recommendations for good health and room for improvement. 51-60 More healthful dietary pattern, with some room for improvement.  >60 Healthy dietary pattern, although there may be some specific behaviors that could be improved.    Nutrition Goals Re-Evaluation:  Nutrition Goals Re-Evaluation     Row Name 01/19/23 1342 02/09/23 1357            Goals   Nutrition Goal healthy  eatting healthy eatting      Comment Elynor said that she does not have a big appite. She is at [facility] so she eats their dinning food. She said they have a lot of veggies and chicken. They give her a lot of food and she isnt able to eat it all. She also said that it is sometimes to salty and she cant eat things too salty. She does not eat a lot of sweets but will eat the ones the [facility] has with dinner. Jora is doing well in rehab. She is still not a big eater.  She uses dining hall food at [place] so not able to control all her food.  She does try to avoid things that are too salty and get in fruits and vegetbales.      Expected Outcome Short : coninue to eat adiquet amout of foods    long term: continue to choise healthy eating Short: Continue to eat enough Long: Continue to pick healthy options               Nutrition Goals Discharge (Final Nutrition Goals Re-Evaluation):  Nutrition Goals Re-Evaluation - 02/09/23 1357       Goals   Nutrition Goal healthy eatting    Comment Ryen is doing well in rehab. She is still not a big eater.  She uses dining hall food at [place] so not able to control all her food.  She does try to avoid things that are too salty and get in fruits and vegetbales.    Expected Outcome Short: Continue to eat enough Long: Continue to pick healthy options             Psychosocial: Target Goals: Acknowledge presence or absence of significant depression and/or stress, maximize coping skills, provide positive support system. Participant is able to verbalize types and ability to use techniques and skills needed for reducing stress and depression.  Initial Review & Psychosocial Screening:  Initial Psych Review & Screening - 12/17/22 1500       Initial Review   Current issues with Current Depression;Current Stress Concerns;Current Sleep Concerns    Source of Stress Concerns Unable to participate in former interests or  hobbies;Chronic Illness      Family Dynamics   Good Support System? Yes   Patient's son and daughter are her support people.     Barriers   Psychosocial barriers to participate in program The patient should benefit from training in stress management and relaxation.      Screening Interventions   Interventions Encouraged to exercise;To provide support and resources with identified psychosocial needs;Provide feedback about the scores to participant    Expected Outcomes Short Term goal: Utilizing psychosocial counselor, staff and physician to assist with identification of specific Stressors or current issues interfering with healing process. Setting desired goal for each stressor or current issue identified.;Long Term Goal: Stressors or current issues are controlled or eliminated.;Short Term goal: Identification and review with participant of any Quality of Life or Depression concerns found by scoring the questionnaire.;Long Term goal: The participant improves quality of Life and PHQ9 Scores as seen by post scores and/or verbalization of changes             Quality of Life Scores:  Scores of 19 and below usually indicate a poorer quality of life in these areas.  A difference of  2-3 points is a clinically meaningful difference.  A difference of 2-3 points in the total score of the Quality of Life  Index has been associated with significant improvement in overall quality of life, self-image, physical symptoms, and general health in studies assessing change in quality of life.   PHQ-9: Review Flowsheet       12/17/2022  Depression screen PHQ 2/9  Decreased Interest 3  Down, Depressed, Hopeless 1  PHQ - 2 Score 4  Altered sleeping 3  Tired, decreased energy 3  Change in appetite 2  Feeling bad or failure about yourself  1  Trouble concentrating 3  Moving slowly or fidgety/restless 0  Suicidal thoughts 0  PHQ-9 Score 16  Difficult doing work/chores Somewhat difficult   Interpretation  of Total Score  Total Score Depression Severity:  1-4 = Minimal depression, 5-9 = Mild depression, 10-14 = Moderate depression, 15-19 = Moderately severe depression, 20-27 = Severe depression   Psychosocial Evaluation and Intervention:  Psychosocial Evaluation - 12/17/22 1502       Psychosocial Evaluation & Interventions   Interventions Stress management education;Relaxation education;Encouraged to exercise with the program and follow exercise prescription    Comments Patient was referred to PR with pulmonary HTN. She was accompained today by her son and daughter. Her PHQ-9 score was 16 due to sleeping issues caused by nocturia, lack of interest in doing things, poor appepite and lack of energy. She was hospitalized in April with HF exacerbation and was discharged to SNF and then went to an assistive living facility in June. She says she has been down and depressed since moving into [facility]. She wants to go back to her home and get back to living independently. She has never been treated for depression or anxiety and says she is not willing to be treated at this time. She is a retired [employer] from [employer]. She is hard of hearting but very alert and orientated. She was discharged from the hospital on O2. She says she has tried outpatient PT and was not able to complete the sessions due to SOB and fatigue so she is very skeptical that she will be able to participate in PR but is willing to try since her son and daughter want her to try. They both say she was not using O2 when she attempted PT and they feel she will be able to participate. They will provide transportation or transportation will be provided by [facility]. Her son and daughter say her diuretic was increased  at her last visit to the HF clinic and since this she has been staying in her room not going to activities or to the dining room to eat like she was doing previously. They think this is mainly due to fear of her having to urinate and soiling  herself. The patient says she has to go to the bathroom every 15 minutes and this is why she does not leave her room. She does wear a pull-up in case she can't make it to the bathroom. She says her goals for the program are to breathe better and feel better overall. Her lack of motivation could be a barrier for her to participate in the program.    Expected Outcomes Short term: Attend sessions. Long Term: Breathe better and feel better overall.    Continue Psychosocial Services  Follow up required by staff             Psychosocial Re-Evaluation:  Psychosocial Re-Evaluation     Row Name 01/19/23 1334 02/09/23 1354           Psychosocial Re-Evaluation   Current issues with History  of Depression;Current Stress Concerns History of Depression;Current Stress Concerns;Current Sleep Concerns      Comments Emalynn stated that she is down about still living at [facility]. She is wanting to get back to living at her own home. She stated that her goals is to get home. She said that she did live by helself. She also stated that [facility] has been a nice place and everyone there has been treating her nicely. She is attending resident event Lanyiah is doing well in rehab.  She is still at [facility] and really wants to get back home.  They have not told her when she will be able to go home.  She knows she has to build up her independence again.  Her kids do not want her home by herself and she even admits that it would be hard for her to that still.  She still is not sleeping well with frequent trips to the bathroom.  She tries to rest as best as she can.      Expected Outcomes Short term: continue to attned resident events and get to know people    long term: continue to attned rehab and exercise to get to personal goals Short: Continue to work to build up independence again Long: Continue to exercise for mental boost and talk with kids.      Interventions Stress management education;Relaxation education --       Continue Psychosocial Services  Follow up required by staff --               Psychosocial Discharge (Final Psychosocial Re-Evaluation):  Psychosocial Re-Evaluation - 02/09/23 1354       Psychosocial Re-Evaluation   Current issues with History of Depression;Current Stress Concerns;Current Sleep Concerns    Comments Lizza is doing well in rehab.  She is still at [facility] and really wants to get back home.  They have not told her when she will be able to go home.  She knows she has to build up her independence again.  Her kids do not want her home by herself and she even admits that it would be hard for her to that still.  She still is not sleeping well with frequent trips to the bathroom.  She tries to rest as best as she can.    Expected Outcomes Short: Continue to work to build up independence again Long: Continue to exercise for mental boost and talk with kids.              Education: Education Goals: Education classes will be provided on a weekly basis, covering required topics. Participant will state understanding/return demonstration of topics presented.  Learning Barriers/Preferences:  Learning Barriers/Preferences - 12/17/22 1458       Learning Barriers/Preferences   Learning Barriers Hearing    Learning Preferences Written Material;Audio;Skilled Demonstration             Education Topics: How Lungs Work and Diseases: - Discuss the anatomy of the lungs and diseases that can affect the lungs, such as COPD.   Exercise: -Discuss the importance of exercise, FITT principles of exercise, normal and abnormal responses to exercise, and how to exercise safely.   Environmental Irritants: -Discuss types of environmental irritants and how to limit exposure to environmental irritants.   Meds/Inhalers and oxygen : - Discuss respiratory medications, definition of an inhaler and oxygen , and the proper way to use an inhaler and oxygen .   Energy Saving Techniques: -  Discuss methods to conserve energy and decrease shortness  of breath when performing activities of daily living.    Bronchial Hygiene / Breathing Techniques: - Discuss breathing mechanics, pursed-lip breathing technique,  proper posture, effective ways to clear airways, and other functional breathing techniques   Cleaning Equipment: - Provides group verbal and written instruction about the health risks of elevated stress, cause of high stress, and healthy ways to reduce stress.   Nutrition I: Fats: - Discuss the types of cholesterol, what cholesterol does to the body, and how cholesterol levels can be controlled.   Nutrition II: Labels: -Discuss the different components of food labels and how to read food labels.   Respiratory Infections: - Discuss the signs and symptoms of respiratory infections, ways to prevent respiratory infections, and the importance of seeking medical treatment when having a respiratory infection.   Stress I: Signs and Symptoms: - Discuss the causes of stress, how stress may lead to anxiety and depression, and ways to limit stress. Flowsheet Row PULMONARY REHAB OTHER RESPIRATORY from 02/25/2023 in [facility]IAC REH[facility]ILIT[facility]  Date 02/11/23  Educator hj  Instruction Review Code 1- Verbalizes Understanding       Stress II: Relaxation: -Discuss relaxation techniques to limit stress.   Oxygen  for Home/Travel: - Discuss how to prepare for travel when on oxygen  and proper ways to transport and store oxygen  to ensure safety.   Knowledge Questionnaire Score:  Knowledge Questionnaire Score - 12/17/22 1458       Knowledge Questionnaire Score   Pre Score 16/18             Core Components/Risk Factors/Patient Goals at Admission:  Personal Goals and Risk Factors at Admission - 12/17/22 1459       Core Components/Risk Factors/Patient Goals on Admission    Weight Management Yes;Obesity;Weight Loss    Intervention Weight Management: Develop a  combined nutrition and exercise program designed to reach desired caloric intake, while maintaining appropriate intake of nutrient and fiber, sodium and fats, and appropriate energy expenditure required for the weight goal.;Weight Management: Provide education and appropriate resources to help participant work on and attain dietary goals.;Weight Management/Obesity: Establish reasonable short term and long term weight goals.;Obesity: Provide education and appropriate resources to help participant work on and attain dietary goals.    Admit Weight 156 lb 8 oz (71 kg)    Goal Weight: Short Term 150 lb (68 kg)    Goal Weight: Long Term 150 lb (68 kg)    Expected Outcomes Short Term: Continue to assess and modify interventions until short term weight is achieved;Long Term: Adherence to nutrition and physical activity/exercise program aimed toward attainment of established weight goal;Weight Loss: Understanding of general recommendations for a balanced deficit meal plan, which promotes 1-2 lb weight loss per week and includes a negative energy balance of [phone] kcal/d;Understanding recommendations for meals to include 15-35% energy as protein, 25-35% energy from fat, 35-60% energy from carbohydrates, less than 200mg  of dietary cholesterol, 20-35 gm of total fiber daily;Understanding of distribution of calorie intake throughout the day with the consumption of 4-5 meals/snacks    Improve shortness of breath with ADL's Yes    Intervention Provide education, individualized exercise plan and daily activity instruction to help decrease symptoms of SOB with activities of daily living.    Expected Outcomes Short Term: Improve cardiorespiratory fitness to achieve a reduction of symptoms when performing ADLs;Long Term: Be able to perform more ADLs without symptoms or delay the onset of symptoms    Increase knowledge of respiratory medications and ability to use  respiratory devices properly  Yes    Intervention Provide  education and demonstration as needed of appropriate use of medications, inhalers, and oxygen  therapy.    Expected Outcomes Short Term: Achieves understanding of medications use. Understands that oxygen  is a medication prescribed by physician. Demonstrates appropriate use of inhaler and oxygen  therapy.;Long Term: Maintain appropriate use of medications, inhalers, and oxygen  therapy.    Heart Failure Yes    Intervention Provide a combined exercise and nutrition program that is supplemented with education, support and counseling about heart failure. Directed toward relieving symptoms such as shortness of breath, decreased exercise tolerance, and extremity edema.    Expected Outcomes Improve functional capacity of life;Short term: Attendance in program 2-3 days a week with increased exercise capacity. Reported lower sodium intake. Reported increased fruit and vegetable intake. Reports medication compliance.;Short term: Daily weights obtained and reported for increase. Utilizing diuretic protocols set by physician.;Long term: Adoption of self-care skills and reduction of barriers for early signs and symptoms recognition and intervention leading to self-care maintenance.    Hypertension Yes    Intervention Monitor prescription use compliance.;Provide education on lifestyle modifcations including regular physical activity/exercise, weight management, moderate sodium restriction and increased consumption of fresh fruit, vegetables, and low fat dairy, alcohol moderation, and smoking cessation.    Expected Outcomes Short Term: Continued assessment and intervention until BP is < 140/62mm HG in hypertensive participants. < 130/6mm HG in hypertensive participants with diabetes, heart failure or chronic kidney disease.;Long Term: Maintenance of blood pressure at goal levels.    Lipids Yes    Intervention Provide education and support for participant on nutrition & aerobic/resistive exercise along with prescribed  medications to achieve LDL 70mg , HDL >40mg .    Expected Outcomes Short Term: Participant states understanding of desired cholesterol values and is compliant with medications prescribed. Participant is following exercise prescription and nutrition guidelines.;Long Term: Cholesterol controlled with medications as prescribed, with individualized exercise RX and with personalized nutrition plan. Value goals: LDL < 70mg , HDL > 40 mg.             Core Components/Risk Factors/Patient Goals Review:   Goals and Risk Factor Review     Row Name 01/19/23 1346 02/09/23 1358           Core Components/Risk Factors/Patient Goals Review   Personal Goals Review Improve shortness of breath with ADL's;Weight Management/Obesity Improve shortness of breath with ADL's;Weight Management/Obesity;Heart Failure;Hypertension;Increase knowledge of respiratory medications and ability to use respiratory devices properly.      Review Rebecka has been working on her weight managment but she does not eat much due to low appite. She does choose to eat healthy options at the living center. She is wanting to imrpvoe her SOB but has not seen an improvemnt yet. She does walk around [place] with help and attend classes to increase her endurance. She is also remebering to use PLB to help with her SOB when walking. Malorie is doing well in rehab.  She keeps a close eye on her weight and it is staying steady for most part.  She does note that she still is retaining some fluid in her legs.  They have improved some.  She does not want more lasix  as she is already constantly going to bathroom.  Her breathing is still not too much better but feels that she can breathe okay.  Her pressures are still running low for her and she talked to Dr. Debera last week about it.  He told her is she  gets symptomatic with it that he would lower her medications.  She wants to get better.      Expected Outcomes Short term: continue to use PLB for SOB   long  term: continue to exericse for mantaince and improving endurance Short: Continue to keep close eye on blood pressures Long: Continue to monitor heart failure symptoms               Core Components/Risk Factors/Patient Goals at Discharge (Final Review):   Goals and Risk Factor Review - 02/09/23 1358       Core Components/Risk Factors/Patient Goals Review   Personal Goals Review Improve shortness of breath with ADL's;Weight Management/Obesity;Heart Failure;Hypertension;Increase knowledge of respiratory medications and ability to use respiratory devices properly.    Review Hero is doing well in rehab.  She keeps a close eye on her weight and it is staying steady for most part.  She does note that she still is retaining some fluid in her legs.  They have improved some.  She does not want more lasix  as she is already constantly going to bathroom.  Her breathing is still not too much better but feels that she can breathe okay.  Her pressures are still running low for her and she talked to Dr. Debera last week about it.  He told her is she gets symptomatic with it that he would lower her medications.  She wants to get better.    Expected Outcomes Short: Continue to keep close eye on blood pressures Long: Continue to monitor heart failure symptoms             ITP Comments:  ITP Comments     Row Name 12/24/22 1349 12/30/22 1518 01/27/23 1633 02/24/23 0819 03/16/23 1350   ITP Comments First full day of exercise!  Patient was oriented to gym and equipment including functions, settings, policies, and procedures.  Patient's individual exercise prescription and treatment plan were reviewed.  All starting workloads were established based on the results of the 6 minute walk test done at initial orientation visit.  The plan for exercise progression was also introduced and progression will be customized based on patient's performance and goals. 30 day review completed. ITP sent to Dr.Jehanzeb Memon,  Medical Director of  Pulmonary Rehab. Continue with ITP unless changes are made by physician.  New to program 30 day review completed. ITP sent to Dr.Jehanzeb Memon, Medical Director of  Pulmonary Rehab. Continue with ITP unless changes are made by physician. 30 day review completed. ITP sent to Dr.Jehanzeb Memon, Medical Director of  Pulmonary Rehab. Continue with ITP unless changes are made by physician. Lusia EUSTACIA URBANEK did not complete her rehab session.  Journey was moving from her walker to her chair and passed out down to floor.  She hit her head and nose.  [employer] was called, she had lost consciousness but never stopped breathing or loss of pulse noticed.  She came back to and attempted to get up on own.  Staff assisted as [employer] arrived.  Team took over and transported to ED for CT of head.    Row Name 03/24/23 1152 03/24/23 1157 04/20/23 1437 04/26/23 0828 04/29/23 1149   ITP Comments Mima was admitted to hospital after episode was found to be a vfib arrest corrected by ICD. Her fall resulted in a nasal fracture and subdermal hematoma on head.  She has been discharged to SNF [facility]).  Will continue to follow. 30 day review completed. ITP sent  to Dr.Jehanzeb Memon, Wellsite Geologist of  Pulmonary Rehab. Continue with ITP unless changes are made by physician. 30 day review completed. ITP sent to Dr.Jehanzeb Memon, Medical Director of  Pulmonary Rehab. Continue with ITP unless changes are made by physician. Patient has not attended since 03/02/23 with heart event in rehab on 03/16/23  that had her admitted from 03/16/23-03/23/23, then transferred to SNF at [facility].  She is now back at [facility] and has a follow up appt with cardiology on 04/28/23.  We will continue to follow for return to rehab. Mahasin has been out on medical hold and has a follow up with cardiologist coming up Annah had her follow up cardiology visit yesterday with Brittany and we discussed rehab with Brittany.  It was felt that  she was no longer appropriate for rehab at this time.  We will discharge her from the program.            Comments: Discharge ITP

## 2023-04-29 NOTE — Progress Notes (Signed)
 Discharge Progress Report  Patient Details  Name: [name] [name] MRN: [id number] Date of Birth: April 24, 1931 Referring Provider:   Flowsheet Row PULMONARY REHAB OTHER RESP ORIENTATION from 12/17/2022 in [facility] CARDIAC REHABILITATION  Referring Provider Darlean Ade MD        Number of Visits: 21  Reason for Discharge:  Patient reached a stable level of exercise. Patient independent in their exercise. Patient has met program and personal goals.  Smoking History:  Social History   Tobacco Use  Smoking Status Never  Smokeless Tobacco Never    Diagnosis:  Pulmonary hypertension (HCC)  ADL UCSD:  Pulmonary Assessment Scores     Row Name 12/17/22 1456         ADL UCSD   ADL Phase Entry     SOB Score total 75     Rest 0     Walk 3     Stairs 5     Bath 3     Dress 3     Shop 4       CAT Score   CAT Score 26       mMRC Score   mMRC Score 3              Initial Exercise Prescription:  Initial Exercise Prescription - 12/17/22 1500       Date of Initial Exercise RX and Referring Provider   Date 12/17/22    Referring Provider Darlean Ade MD      Oxygen    Oxygen  Continuous    Liters 3    Maintain Oxygen  Saturation 88% or higher      NuStep   Level 1    SPM 80    Minutes 30    METs 1.5      Prescription Details   Frequency (times per week) 2    Duration Progress to 30 minutes of continuous aerobic without signs/symptoms of physical distress      Intensity   THRR 40-80% of Max Heartrate 90-118    Ratings of Perceived Exertion 11-13    Perceived Dyspnea 0-4      Progression   Progression Continue to progress workloads to maintain intensity without signs/symptoms of physical distress.      Resistance Training   Training Prescription Yes    Weight 2 lb    Reps 10-15             Discharge Exercise Prescription (Final Exercise Prescription Changes):  Exercise Prescription Changes - 03/02/23 1500       Response to Exercise    Blood Pressure (Admit) 122/62    Blood Pressure (Exit) 122/62    Heart Rate (Admit) 63 bpm    Heart Rate (Exercise) 70 bpm    Heart Rate (Exit) 61 bpm    Oxygen  Saturation (Admit) 95 %    Oxygen  Saturation (Exercise) 90 %    Oxygen  Saturation (Exit) 91 %    Rating of Perceived Exertion (Exercise) 13    Perceived Dyspnea (Exercise) 2    Duration Continue with 30 min of aerobic exercise without signs/symptoms of physical distress.    Intensity THRR unchanged      Progression   Progression Continue to progress workloads to maintain intensity without signs/symptoms of physical distress.      Resistance Training   Training Prescription Yes    Weight 2 lbs    Reps 10-15      Oxygen    Oxygen  Continuous    Liters 2  NuStep   Level 2    SPM 53    Minutes 30    METs 2.15      Home Exercise Plan   Plans to continue exercise at Home (comment)    Frequency Add 2 additional days to program exercise sessions.      Oxygen    Maintain Oxygen  Saturation 88% or higher             Functional Capacity:  6 Minute Walk     Row Name 12/17/22 1516         6 Minute Walk   Phase Initial     Distance 244 feet     Walk Time 2.83 minutes     # of Rest Breaks 1  2:10     MPH 0.97     RPE 15     Perceived Dyspnea  3     Symptoms Yes (comment)     Comments SOB     Resting HR 63 bpm     Resting BP 92/48     Resting Oxygen  Saturation  92 %     Exercise Oxygen  Saturation  during 6 min walk 84 %     Max Ex. HR 87 bpm     Max Ex. BP 138/82     2 Minute Post BP 134/70       Interval HR   1 Minute HR 66     2 Minute HR 68     3 Minute HR 67     4 Minute HR 63     5 Minute HR 63     6 Minute HR 87     2 Minute Post HR 64     Interval Heart Rate? Yes       Interval Oxygen    Interval Oxygen ? Yes     Baseline Oxygen  Saturation % 92 %     1 Minute Oxygen  Saturation % 87 %     1 Minute Liters of Oxygen  3 L  pulsed     2 Minute Oxygen  Saturation % 84 %     2 Minute Liters of  Oxygen  3 L     3 Minute Oxygen  Saturation % 91 %     3 Minute Liters of Oxygen  3 L     4 Minute Oxygen  Saturation % 92 %     4 Minute Liters of Oxygen  3 L     5 Minute Oxygen  Saturation % 93 %     5 Minute Liters of Oxygen  3 L     6 Minute Oxygen  Saturation % 92 %     6 Minute Liters of Oxygen  3 L     2 Minute Post Oxygen  Saturation % 87 %     2 Minute Post Liters of Oxygen  3 L              Psychological, QOL, Others - Outcomes: PHQ 2/9:    12/17/2022    2:58 PM  Depression screen PHQ 2/9  Decreased Interest 3  Down, Depressed, Hopeless 1  PHQ - 2 Score 4  Altered sleeping 3  Tired, decreased energy 3  Change in appetite 2  Feeling bad or failure about yourself  1  Trouble concentrating 3  Moving slowly or fidgety/restless 0  Suicidal thoughts 0  PHQ-9 Score 16  Difficult doing work/chores Somewhat difficult    Nutrition & Weight - Outcomes:  Pre Biometrics - 12/17/22 1526       Pre Biometrics  Height 5' (1.524 m)    Weight 156 lb 8 oz (71 kg)    Waist Circumference 39 inches    Hip Circumference 42.5 inches    Waist to Hip Ratio 0.92 %    BMI (Calculated) 30.56    Grip Strength 14.6 kg

## 2023-04-30 ENCOUNTER — Non-Acute Institutional Stay (SKILLED_NURSING_FACILITY): Payer: Self-pay | Admitting: Internal Medicine

## 2023-04-30 ENCOUNTER — Encounter: Payer: Self-pay | Admitting: Internal Medicine

## 2023-04-30 DIAGNOSIS — R238 Other skin changes: Secondary | ICD-10-CM

## 2023-04-30 DIAGNOSIS — I5032 Chronic diastolic (congestive) heart failure: Secondary | ICD-10-CM

## 2023-04-30 DIAGNOSIS — N184 Chronic kidney disease, stage 4 (severe): Secondary | ICD-10-CM

## 2023-04-30 DIAGNOSIS — I129 Hypertensive chronic kidney disease with stage 1 through stage 4 chronic kidney disease, or unspecified chronic kidney disease: Secondary | ICD-10-CM | POA: Diagnosis not present

## 2023-04-30 DIAGNOSIS — J449 Chronic obstructive pulmonary disease, unspecified: Secondary | ICD-10-CM

## 2023-04-30 NOTE — Progress Notes (Signed)
 NURSING HOME LOCATION:  [facility] ROOM NUMBER:  [id number] P  CODE STATUS:  Full Code  PCP:  Barnie Seip NP  This is a nursing facility follow up visit for specific acute issues of abnormal PCXR & new SQ vesicle LLL.  Interim medical record and care since last SNF visit was updated with review of diagnostic studies and change in clinical status since last visit were documented.  HPI:The Wound Care Nurse requested that I evaluate a large vesicle which has  appeared on the left lower extremity laterally.  This is in the context of recent parenteral and oral antibiotics for right lower lobe pneumonia radiographically.  That original film & the follow-up film were reviewed.  On the original film the lower one fourth of the right lower lobe demonstrated an irregular infiltrate.  Cardiac enlargement was present.  Follow-up film shows decrease in the size of the right lower lobe infiltrates but suggestion of bilateral effusions based on appearance of meniscus.  Also on the follow-up film fluid in the right major fissure is present. Cardiology follow-up was completed 1/8; that note was reviewed.  The patient had been having progressive lower extremity edema over the past few weeks despite taking torsemide  100 mg daily.  1+ pitting edema was documented prompting increasing the torsemide  from 100 mg daily to 60 mg twice daily.  On 1/1 BNP was 721.  CKD stage IV was stable with a creatinine 1.83, BUN of 35, and GFR of 26.  Repeat BNP and BMET were to be performed in 7-10 days.  Blood pressure remained soft and losartan  was not reinitiated.  Review of systems: She remains short of breath but denies significant cough or sputum production.  She questions whether the vesicle is related to being on Eliquis .  This was initiated because of an elevated D-dimer.  Constitutional: No fever  Eyes: No redness, discharge, pain, vision change ENT/mouth: No nasal congestion,  purulent discharge, earache,  change in hearing, sore throat  Cardiovascular: No chest pain, palpitations Respiratory: No hemoptysis, significant snoring, apnea   Gastrointestinal: No heartburn, dysphagia, abdominal pain, nausea /vomiting, rectal bleeding, melena, change in bowels Genitourinary: No dysuria, hematuria, pyuria, incontinence, nocturia Musculoskeletal: No joint stiffness, joint swelling, weakness, pain Hematologic/lymphatic: No significant lymphadenopathy, abnormal bleeding Allergy/immunology: No itchy/watery eyes, significant sneezing, urticaria, angioedema  Physical exam:  Pertinent or positive findings: She is on nasal O2 .There is accentuation of S1.  She has minor rales at the bases.  Abdomen is protuberant.  Pedal pulses are not palpable.  She does have 1/2+ pitting edema.  There is softball sized vesicle over the left lateral calf which is draining clear fluid.  She also has subcutaneous effusion in the prepatellar area of the right knee.  The lower extremities reveal keratotic skin changes. Ecchymoses present over dorsum of hands.  General appearance: Adequately nourished; no acute distress, increased work of breathing is present.   Lymphatic: No lymphadenopathy about the head, neck, axilla. Eyes: No conjunctival inflammation or lid edema is present. There is no scleral icterus. Ears:  External ear exam shows no significant lesions or deformities.   Nose:  External nasal examination shows no deformity or inflammation. Nasal mucosa are pink and moist without lesions, exudates Oral exam:  Lips and gums are healthy appearing. There is no oropharyngeal erythema or exudate. Neck:  No thyromegaly, masses, tenderness noted.    Heart:  Normal rate and regular rhythm. S2 normal without gallop, murmur, click, rub .  Lungs:  without wheezes, rhonchi, rubs. Abdomen: Bowel sounds are normal. Abdomen is soft and nontender with no organomegaly, hernias, masses. GU: Deferred  Extremities:  No cyanosis,  clubbing Neurologic exam :Balance, Rhomberg, finger to nose testing could not be completed due to clinical state Skin: Warm & dry w/o tenting.   See summary under each active problem in the Problem List with associated updated therapeutic plan

## 2023-04-30 NOTE — Patient Instructions (Signed)
 See assessment and plan under each diagnosis in the problem list and acutely for this visit

## 2023-04-30 NOTE — Assessment & Plan Note (Addendum)
 The BNP of 721 on 04/21/2023 and the persistent peripheral edema suggest active congestive heart failure.  Cardiology has increased the torsemide  to 60 mg twice daily.  Repeat film after broad-spectrum antibiotics suggest bilateral effusions and effusion in the right major fissure.  Film will be repeated 1/13 to assess response to increased torsemide  dose.  BMP will also be repeated next week. Monitor daily weights.

## 2023-04-30 NOTE — Assessment & Plan Note (Addendum)
 CKD is stable stage IV with current creatinine 1.83 and GFR 26.  Losartan was not restarted as blood pressure is well-controlled and actually somewhat soft.  Continue to monitor.  BMET will be repeated next week.

## 2023-04-30 NOTE — Assessment & Plan Note (Signed)
 Review of external film suggests that the right lower lobe infiltrate is improved but she has bilateral pleural effusions with menisci of the costophrenic angles as well as fluid in the right major fissure.  Nebulized DuoNeb will be prescribed 3 times daily over the next 4 days with follow-up chest x-ray on 1/13.

## 2023-05-03 DIAGNOSIS — R918 Other nonspecific abnormal finding of lung field: Secondary | ICD-10-CM | POA: Diagnosis not present

## 2023-05-03 DIAGNOSIS — Z95818 Presence of other cardiac implants and grafts: Secondary | ICD-10-CM | POA: Diagnosis not present

## 2023-05-03 DIAGNOSIS — J9811 Atelectasis: Secondary | ICD-10-CM | POA: Diagnosis not present

## 2023-05-03 DIAGNOSIS — J90 Pleural effusion, not elsewhere classified: Secondary | ICD-10-CM | POA: Diagnosis not present

## 2023-05-03 DIAGNOSIS — J189 Pneumonia, unspecified organism: Secondary | ICD-10-CM | POA: Diagnosis not present

## 2023-05-04 ENCOUNTER — Non-Acute Institutional Stay (SKILLED_NURSING_FACILITY): Payer: Self-pay | Admitting: Adult Health

## 2023-05-04 ENCOUNTER — Encounter (HOSPITAL_COMMUNITY): Payer: Medicare PPO

## 2023-05-04 ENCOUNTER — Encounter: Payer: Self-pay | Admitting: Adult Health

## 2023-05-04 DIAGNOSIS — I5032 Chronic diastolic (congestive) heart failure: Secondary | ICD-10-CM

## 2023-05-04 DIAGNOSIS — J9611 Chronic respiratory failure with hypoxia: Secondary | ICD-10-CM | POA: Diagnosis not present

## 2023-05-04 NOTE — Progress Notes (Signed)
 Location:  [facility] Room Number: 144 Place of Service:  SNF (31)   CODE STATUS: full   Allergies  Allergen Reactions   Codeine Nausea And Vomiting   Fentanyl  Itching    Itching, redness to scalp and neck   Keflex  [Cephalexin ] Itching and Rash    Chief Complaint  Patient presents with   Acute Visit    Follow up chest x-ray     HPI:  Her chest x-ray demonstrates chf with bilateral pleural effusion and bibasilar atelectasis. She was given an extra 40 mg torsemide  today and will repeat again in the AM. Her bilateral legs are wrapped in ace wraps. She does have shortness of breath present. She has edema in both lower extremity edema with right >left.   Past Medical History:  Diagnosis Date   Anxiety    Asthma    Chronic atrial fibrillation (HCC)    Chronic back pain    Chronic diastolic heart failure (HCC)    Chronic obstructive pulmonary disease, unspecified (HCC)    Complete heart block (HCC)    COPD (chronic obstructive pulmonary disease) (HCC)    Coronary atherosclerosis of native coronary artery    a. s/p CABG in 1998 with LIMA-LAD, SVG-Diagonal, SVG-RI-OM b. DES to PLA in 2006   DDD (degenerative disc disease), lumbar    Essential hypertension    Headache    ICD (implantable cardioverter-defibrillator) in place    Mixed hyperlipidemia    Osteoarthritis    Ventricular fibrillation (HCC) 2003   a. seen on PPM interrogation a/w syncope    Past Surgical History:  Procedure Laterality Date   CARDIAC DEFIBRILLATOR PLACEMENT     MDT dual chamber ICD   CARDIOVERSION N/A 08/09/2014   Procedure: CARDIOVERSION;  Surgeon: Jerel Balding, MD;  Location: [facility];  Service: Cardiovascular;  Laterality: N/A;   CORONARY ARTERY BYPASS GRAFT     LIMA to LAD, SVG to diagonal, SVG to ramus and OM   ICD GENERATOR CHANGEOUT N/A 08/06/2021   Procedure: ICD GENERATOR CHANGEOUT;  Surgeon: Waddell Danelle ORN, MD;  Location: [facility] INVASIVE CV LAB;  Service:  Cardiovascular;  Laterality: N/A;   IMPLANTABLE CARDIOVERTER DEFIBRILLATOR GENERATOR CHANGE N/A 09/25/2013   Procedure: IMPLANTABLE CARDIOVERTER DEFIBRILLATOR GENERATOR CHANGE;  Surgeon: Danelle ORN Waddell, MD;  Location: [facility] CATH LAB;  Service: Cardiovascular;  Laterality: N/A;   LEAD REVISION N/A 09/29/2013   Procedure: LEAD REVISION;  Surgeon: Elspeth JAYSON Sage, MD;  Location: [facility] CATH LAB;  Service: Cardiovascular;  Laterality: N/A;   TONSILLECTOMY     YAG LASER APPLICATION Left 12/27/2012   Procedure: YAG LASER APPLICATION;  Surgeon: Oneil T. Roz, MD;  Location: [facility];  Service: Ophthalmology;  Laterality: Left;   YAG LASER APPLICATION Right 01/10/2013   Procedure: YAG LASER APPLICATION;  Surgeon: Oneil T. Roz, MD;  Location: [facility];  Service: Ophthalmology;  Laterality: Right;    Social History   Socioeconomic History   Marital status: Widowed    Spouse name: Not on file   Number of children: 2   Years of education: Not on file   Highest education level: Not on file  Occupational History   Occupation: Retired    Comment: Copywriter, Advertising: RETIRED  Tobacco Use   Smoking status: Never   Smokeless tobacco: Never  Vaping Use   Vaping status: Never Used  Substance and Sexual Activity   Alcohol use: No    Alcohol/week: 0.0 standard drinks of alcohol  Drug use: No   Sexual activity: Never  Other Topics Concern   Not on file  Social History Narrative   Not on file   Social Drivers of Health   Financial Resource Strain: Not on file  Food Insecurity: No Food Insecurity (03/16/2023)   Hunger Vital Sign    Worried About Running Out of Food in the Last Year: Never true    Ran Out of Food in the Last Year: Never true  Transportation Needs: No Transportation Needs (03/16/2023)   PRAPARE - Administrator, Civil Service (Medical): No    Lack of Transportation (Non-Medical): No  Physical Activity: Not on file  Stress: Not on file  Social Connections: Not on  file  Intimate Partner Violence: Not At Risk (03/16/2023)   Humiliation, Afraid, Rape, and Kick questionnaire    Fear of Current or Ex-Partner: No    Emotionally Abused: No    Physically Abused: No    Sexually Abused: No   Family History  Problem Relation Age of Onset   Heart attack Father    Heart attack Brother       VITAL SIGNS BP 120/76   Pulse 75   Temp 97.7 F (36.5 C)   Resp 20   Ht 5' (1.524 m)   Wt 169 lb 6.4 oz (76.8 kg)   SpO2 97%   BMI 33.08 kg/m   Outpatient Encounter Medications as of 05/04/2023  Medication Sig   acetaminophen  (TYLENOL ) 325 MG tablet Take 2 tablets (650 mg total) by mouth every 6 (six) hours as needed for mild pain (or Fever >/= 101).   albuterol  (VENTOLIN  HFA) 108 (90 Base) MCG/ACT inhaler Inhale 2 puffs into the lungs every 6 (six) hours as needed for wheezing or shortness of breath.   amiodarone  (PACERONE ) 200 MG tablet Take 200 mg by mouth 2 (two) times daily.   apixaban  (ELIQUIS ) 2.5 MG TABS tablet Take 1 tablet (2.5 mg total) by mouth 2 (two) times daily.   atorvastatin  (LIPITOR) 20 MG tablet Take 1 tablet (20 mg total) by mouth daily.   bisoprolol  (ZEBETA ) 5 MG tablet Take 1 tablet (5 mg total) by mouth daily.   Calcium  Carbonate-Vitamin D (CALCIUM  600 + D PO) Take 1 tablet by mouth 2 (two) times daily.   dapagliflozin  propanediol (FARXIGA ) 10 MG TABS tablet Take 1 tablet (10 mg total) by mouth daily before breakfast.   Fluticasone -Umeclidin-Vilant (TRELEGY ELLIPTA ) 100-62.5-25 MCG/ACT AEPB Inhale 1 puff into the lungs daily.   folic acid  (FOLVITE ) 1 MG tablet Take 1 tablet (1 mg total) by mouth daily.   methocarbamol  (ROBAXIN ) 500 MG tablet Take 1 tablet (500 mg total) by mouth every 6 (six) hours as needed for muscle spasms.   Polyethyl Glycol-Propyl Glycol (SYSTANE OP) Place 1 drop into both eyes daily as needed (Dry eyes).   polyethylene glycol (MIRALAX  / GLYCOLAX ) 17 g packet Take 17 g by mouth daily as needed for mild constipation.    torsemide  (DEMADEX ) 20 MG tablet Take 3 tablets (60 mg total) by mouth 2 (two) times daily.   No facility-administered encounter medications on file as of 05/04/2023.     SIGNIFICANT DIAGNOSTIC EXAMS  LABS REVIEWED:   03-16-23: wbc 10.3; hgb 12.1; hct 37.3; mcv 100.0 plt 186; glucose 134; bun 45; creat 1.71; k+ 3.5; na++ 134; ca 9.2 gfr 28 03-19-23: wbc 17.0; hgb 11.4; hct 36.2; mcv 100.3 plt 188; glucose 112; bun 29; creat 1.26; k+ 4.2; na++ 141; ca 8.6 gfr  41; protein 7.5 albumin 3.5 tsh 2.759 03-22-41: wbc 11.5; hgb 10.5; hct 33.0; mcv 98.8 plt 225; glucose 101; bun 59; creat 1.93; k+ 3.9; na++ 136; ca 8.8 gfr 24   NO NEW LABS.   Review of Systems  Constitutional:  Positive for malaise/fatigue.  Respiratory:  Positive for shortness of breath. Negative for cough.   Cardiovascular:  Positive for leg swelling. Negative for chest pain and palpitations.  Gastrointestinal:  Negative for abdominal pain, constipation and heartburn.  Musculoskeletal:  Negative for back pain, joint pain and myalgias.  Skin: Negative.   Neurological:  Negative for dizziness.  Psychiatric/Behavioral:  The patient is not nervous/anxious.    Physical Exam Constitutional:      General: She is not in acute distress.    Appearance: She is well-developed. She is obese. She is not diaphoretic.  Neck:     Thyroid : No thyromegaly.  Cardiovascular:     Rate and Rhythm: Normal rate and regular rhythm.     Pulses: Normal pulses.     Heart sounds: Normal heart sounds.  Pulmonary:     Effort: Pulmonary effort is normal. No respiratory distress.     Breath sounds: Rhonchi present.  Abdominal:     General: Bowel sounds are normal. There is no distension.     Palpations: Abdomen is soft.     Tenderness: There is no abdominal tenderness.  Musculoskeletal:        General: Normal range of motion.     Cervical back: Neck supple.     Right lower leg: No edema.     Left lower leg: No edema.  Lymphadenopathy:      Cervical: No cervical adenopathy.  Skin:    General: Skin is warm and dry.     Comments: Has a resolving hematoma right leg Left lower leg with large burst blister present without signs of infection Right lower extremity blister intact bright red without warmth or drainage present.   Neurological:     Mental Status: She is alert. Mental status is at baseline.  Psychiatric:        Mood and Affect: Mood normal.     ASSESSMENT/ PLAN:  TODAY  Chronic diastolic congestive heart failure Chronic respiratory failure with hypoxia  Will give an extra 40 mg torsemide  today and tomorrow Will begin 1200 cc fluid restriction Will use incentive spirometer four times daily  On Thursday will check bmp bnp    Barnie Seip NP [facility]  call [phone]

## 2023-05-06 ENCOUNTER — Other Ambulatory Visit (HOSPITAL_COMMUNITY)
Admission: RE | Admit: 2023-05-06 | Discharge: 2023-05-06 | Disposition: A | Discharge status: Home / Self Care | Payer: Medicare PPO | Source: Skilled Nursing Facility | Attending: Adult Health | Admitting: Adult Health

## 2023-05-06 ENCOUNTER — Encounter (HOSPITAL_COMMUNITY): Payer: Medicare PPO

## 2023-05-06 DIAGNOSIS — I5032 Chronic diastolic (congestive) heart failure: Secondary | ICD-10-CM | POA: Diagnosis not present

## 2023-05-06 LAB — BASIC METABOLIC PANEL
Anion gap: 11 (ref 5–15)
BUN: 69 mg/dL — ABNORMAL HIGH (ref 8–23)
CO2: 32 mmol/L (ref 22–32)
Calcium: 8.8 mg/dL — ABNORMAL LOW (ref 8.9–10.3)
Chloride: 93 mmol/L — ABNORMAL LOW (ref 98–111)
Creatinine, Ser: 2.26 mg/dL — ABNORMAL HIGH (ref 0.44–1.00)
GFR, Estimated: 20 mL/min — ABNORMAL LOW (ref >60)
Glucose, Bld: 109 mg/dL — ABNORMAL HIGH (ref 70–99)
Potassium: 4.1 mmol/L (ref 3.5–5.1)
Sodium: 136 mmol/L (ref 135–145)

## 2023-05-06 LAB — BRAIN NATRIURETIC PEPTIDE: B Natriuretic Peptide: 424 pg/mL — ABNORMAL HIGH (ref 0.0–100.0)

## 2023-05-07 ENCOUNTER — Ambulatory Visit (INDEPENDENT_AMBULATORY_CARE_PROVIDER_SITE_OTHER): Payer: Medicare PPO

## 2023-05-07 DIAGNOSIS — I4729 Other ventricular tachycardia: Secondary | ICD-10-CM

## 2023-05-07 LAB — CUP PACEART REMOTE DEVICE CHECK
Battery Remaining Longevity: 54 mo
Battery Voltage: 2.96 V
Brady Statistic AP VP Percent: 9.27 %
Brady Statistic AP VS Percent: 0 %
Brady Statistic AS VP Percent: 90.65 %
Brady Statistic AS VS Percent: 0.08 %
Brady Statistic RA Percent Paced: 7.88 %
Brady Statistic RV Percent Paced: 99.93 %
Date Time Interrogation Session: 20250117022523
HighPow Impedance: 53 Ohm
HighPow Impedance: 73 Ohm
Implantable Lead Connection Status: 753985
Implantable Lead Connection Status: 753985
Implantable Lead Implant Date: 20030823
Implantable Lead Implant Date: 20030823
Implantable Lead Location: 753859
Implantable Lead Location: 753860
Implantable Lead Model: 157
Implantable Lead Model: 4086
Implantable Lead Serial Number: 108215
Implantable Lead Serial Number: 112190
Implantable Pulse Generator Implant Date: 20230419
Lead Channel Impedance Value: 342 Ohm
Lead Channel Impedance Value: 342 Ohm
Lead Channel Impedance Value: 399 Ohm
Lead Channel Pacing Threshold Amplitude: 1.375 V
Lead Channel Pacing Threshold Pulse Width: 0.4 ms
Lead Channel Sensing Intrinsic Amplitude: 0.25 mV
Lead Channel Sensing Intrinsic Amplitude: 0.25 mV
Lead Channel Sensing Intrinsic Amplitude: 9.75 mV
Lead Channel Sensing Intrinsic Amplitude: 9.75 mV
Lead Channel Setting Pacing Amplitude: 2 V
Lead Channel Setting Pacing Amplitude: 2.75 V
Lead Channel Setting Pacing Pulse Width: 0.4 ms
Lead Channel Setting Sensing Sensitivity: 0.3 mV
Zone Setting Status: 755011

## 2023-05-10 ENCOUNTER — Encounter: Payer: Self-pay | Admitting: Internal Medicine

## 2023-05-10 ENCOUNTER — Ambulatory Visit: Payer: Medicare PPO | Attending: Internal Medicine | Admitting: Internal Medicine

## 2023-05-10 VITALS — BP 94/58 | HR 77 | Ht 60.0 in | Wt 169.0 lb

## 2023-05-10 DIAGNOSIS — I480 Paroxysmal atrial fibrillation: Secondary | ICD-10-CM | POA: Diagnosis not present

## 2023-05-10 LAB — CUP PACEART INCLINIC DEVICE CHECK
Date Time Interrogation Session: 20250120112408
Implantable Lead Connection Status: 753985
Implantable Lead Connection Status: 753985
Implantable Lead Implant Date: 20030823
Implantable Lead Implant Date: 20030823
Implantable Lead Location: 753859
Implantable Lead Location: 753860
Implantable Lead Model: 157
Implantable Lead Model: 4086
Implantable Lead Serial Number: 108215
Implantable Lead Serial Number: 112190
Implantable Pulse Generator Implant Date: 20230419

## 2023-05-10 MED ORDER — AMIODARONE HCL 200 MG PO TABS: 200.0000 mg | ORAL_TABLET | Freq: Every day | ORAL | 3 refills | Status: DC

## 2023-05-10 MED ORDER — TORSEMIDE 20 MG PO TABS: 60.0000 mg | ORAL_TABLET | Freq: Every day | ORAL | 3 refills | Status: DC

## 2023-05-10 NOTE — Patient Instructions (Signed)
Medication Instructions:  Your physician has recommended you make the following change in your medication:   Decrease Amiodarone to 200 mg Daily  Decrease Torsemide to 60 mg in the AM and none in the PM   Chocolate ensure shake daily   Keep Legs elevated   *If you need a refill on your cardiac medications before your next appointment, please call your pharmacy*   Lab Work: .NONE   If you have labs (blood work) drawn today and your tests are completely normal, you will receive your results only by: MyChart Message (if you have MyChart) OR A paper copy in the mail If you have any lab test that is abnormal or we need to change your treatment, we will call you to review the results.   Testing/Procedures: NONE    Follow-Up: At [facility], you and your health needs are our priority.  As part of our continuing mission to provide you with exceptional heart care, we have created designated Provider Care Teams.  These Care Teams include your primary Cardiologist (physician) and Advanced Practice Providers (APPs -  Physician Assistants and Nurse Practitioners) who all work together to provide you with the care you need, when you need it.  We recommend signing up for the patient portal called "MyChart".  Sign up information is provided on this After Visit Summary.  MyChart is used to connect with patients for Virtual Visits (Telemedicine).  Patients are able to view lab/test results, encounter notes, upcoming appointments, etc.  Non-urgent messages can be sent to your provider as well.   To learn more about what you can do with MyChart, go to [email or web address].    Your next appointment:   6 -8 week(s)  Provider:   Lewayne Bunting, MD    Other Instructions Thank you for choosing [facility]!

## 2023-05-10 NOTE — Progress Notes (Signed)
HPI [name] [name] returns today for followup. She is a pleasant 89 yo woman with a h/o chronic systolic/diastolic heart failure, persistent atrial fib, CHB, s/p PPM, VT, s/p ICD and asthma. She has been in the hospital when she had a syncopal episode due to VF, and subsequent facial fracture. She was defibrillated by her ICD. She was hospitalized and has gone to a SNF and has had trouble with sob/pneumonia/peripheral edema. Her renal failure is stage 4.  Allergies  Allergen Reactions   Codeine Nausea And Vomiting   Fentanyl Itching    Itching, redness to scalp and neck   Keflex [Cephalexin] Itching and Rash     Current Outpatient Medications  Medication Sig Dispense Refill   acetaminophen (TYLENOL) 325 MG tablet Take 2 tablets (650 mg total) by mouth every 6 (six) hours as needed for mild pain (or Fever >/= 101). 100 tablet 2   albuterol (VENTOLIN HFA) 108 (90 Base) MCG/ACT inhaler Inhale 2 puffs into the lungs every 6 (six) hours as needed for wheezing or shortness of breath. 18 g 2   amiodarone (PACERONE) 200 MG tablet Take 200 mg by mouth 2 (two) times daily.     apixaban (ELIQUIS) 2.5 MG TABS tablet Take 1 tablet (2.5 mg total) by mouth 2 (two) times daily.     atorvastatin (LIPITOR) 20 MG tablet Take 1 tablet (20 mg total) by mouth daily. 90 tablet 3   bisoprolol (ZEBETA) 5 MG tablet Take 1 tablet (5 mg total) by mouth daily.     Calcium Carbonate-Vitamin D (CALCIUM 600 + D PO) Take 1 tablet by mouth 2 (two) times daily.     dapagliflozin propanediol (FARXIGA) 10 MG TABS tablet Take 1 tablet (10 mg total) by mouth daily before breakfast. 30 tablet 6   Fluticasone-Umeclidin-Vilant (TRELEGY ELLIPTA) 100-62.5-25 MCG/ACT AEPB Inhale 1 puff into the lungs daily. 60 each 6   folic acid (FOLVITE) 1 MG tablet Take 1 tablet (1 mg total) by mouth daily. 30 tablet 3   methocarbamol (ROBAXIN) 500 MG tablet Take 1 tablet (500 mg total) by mouth every 6 (six) hours as needed for muscle spasms.      NON FORMULARY Take 1,200 mLs by mouth continuous. 1200 cc fluid restriction     Polyethyl Glycol-Propyl Glycol (SYSTANE OP) Place 1 drop into both eyes daily as needed (Dry eyes).     polyethylene glycol (MIRALAX / GLYCOLAX) 17 g packet Take 17 g by mouth daily as needed for mild constipation. 14 each 0   torsemide (DEMADEX) 20 MG tablet Take 3 tablets (60 mg total) by mouth 2 (two) times daily. 540 tablet 3   No current facility-administered medications for this visit.     Past Medical History:  Diagnosis Date   Anxiety    Asthma    Chronic atrial fibrillation (HCC)    Chronic back pain    Chronic diastolic heart failure (HCC)    Chronic obstructive pulmonary disease, unspecified (HCC)    Complete heart block (HCC)    COPD (chronic obstructive pulmonary disease) (HCC)    Coronary atherosclerosis of native coronary artery    a. s/p CABG in 1998 with LIMA-LAD, SVG-Diagonal, SVG-RI-OM b. DES to PLA in 2006   DDD (degenerative disc disease), lumbar    Essential hypertension    Headache    ICD (implantable cardioverter-defibrillator) in place    Mixed hyperlipidemia    Osteoarthritis    Ventricular fibrillation (HCC) 2003   a. seen on  PPM interrogation a/w syncope    ROS:   All systems reviewed and negative except as noted in the HPI.   Past Surgical History:  Procedure Laterality Date   CARDIAC DEFIBRILLATOR PLACEMENT     MDT dual chamber ICD   CARDIOVERSION N/A 08/09/2014   Procedure: CARDIOVERSION;  Surgeon: Thurmon Fair, MD;  Location: [facility];  Service: Cardiovascular;  Laterality: N/A;   CORONARY ARTERY BYPASS GRAFT     LIMA to LAD, SVG to diagonal, SVG to ramus and OM   ICD GENERATOR CHANGEOUT N/A 08/06/2021   Procedure: ICD GENERATOR CHANGEOUT;  Surgeon: Marinus Maw, MD;  Location: [facility] INVASIVE CV LAB;  Service: Cardiovascular;  Laterality: N/A;   IMPLANTABLE CARDIOVERTER DEFIBRILLATOR GENERATOR CHANGE N/A 09/25/2013   Procedure: IMPLANTABLE CARDIOVERTER  DEFIBRILLATOR GENERATOR CHANGE;  Surgeon: Marinus Maw, MD;  Location: [facility] CATH LAB;  Service: Cardiovascular;  Laterality: N/A;   LEAD REVISION N/A 09/29/2013   Procedure: LEAD REVISION;  Surgeon: Duke Salvia, MD;  Location: [facility] CATH LAB;  Service: Cardiovascular;  Laterality: N/A;   TONSILLECTOMY     YAG LASER APPLICATION Left 12/27/2012   Procedure: YAG LASER APPLICATION;  Surgeon: Loraine Leriche T. Nile Riggs, MD;  Location: [facility];  Service: Ophthalmology;  Laterality: Left;   YAG LASER APPLICATION Right 01/10/2013   Procedure: YAG LASER APPLICATION;  Surgeon: Loraine Leriche T. Nile Riggs, MD;  Location: [facility];  Service: Ophthalmology;  Laterality: Right;     Family History  Problem Relation Age of Onset   Heart attack Father    Heart attack Brother      Social History   Socioeconomic History   Marital status: Widowed    Spouse name: Not on file   Number of children: 2   Years of education: Not on file   Highest education level: Not on file  Occupational History   Occupation: Retired    Comment: Copywriter, advertising: RETIRED  Tobacco Use   Smoking status: Never   Smokeless tobacco: Never  Vaping Use   Vaping status: Never Used  Substance and Sexual Activity   Alcohol use: No    Alcohol/week: 0.0 standard drinks of alcohol   Drug use: No   Sexual activity: Never  Other Topics Concern   Not on file  Social History Narrative   Not on file   Social Drivers of Health   Financial Resource Strain: Not on file  Food Insecurity: No Food Insecurity (03/16/2023)   Hunger Vital Sign    Worried About Running Out of Food in the Last Year: Never true    Ran Out of Food in the Last Year: Never true  Transportation Needs: No Transportation Needs (03/16/2023)   PRAPARE - Administrator, Civil Service (Medical): No    Lack of Transportation (Non-Medical): No  Physical Activity: Not on file  Stress: Not on file  Social Connections: Not on file  Intimate Partner Violence: Not At  Risk (03/16/2023)   Humiliation, Afraid, Rape, and Kick questionnaire    Fear of Current or Ex-Partner: No    Emotionally Abused: No    Physically Abused: No    Sexually Abused: No     Ht 5' (1.524 m)   Wt 169 lb (76.7 kg)   SpO2 99% Comment: on 3 liters oxygen  BMI 33.01 kg/m   Physical Exam:  Chronically ill appearing NAD HEENT: Unremarkable Neck:  No JVD, no thyromegally Lymphatics:  No adenopathy Back:  No CVA tenderness Lungs:  Clear with no wheezes HEART:  Regular rate rhythm, no murmurs, no rubs, no clicks Abd:  soft, positive bowel sounds, no organomegally, no rebound, no guarding Ext:  2 plus pulses, no edema, no cyanosis, no clubbing Skin:  No rashes no nodules Neuro:  CN II through XII intact, motor grossly intact  EKG - atrial fib with ventricular pacing  DEVICE  Normal device function.  See PaceArt for details.   Assess/Plan:  VT - she has only had NSVT since her last visit. 2. Persistent atrial fib - despite high dose amio, she is back in atrial fib.  3. CHB - she is asymptomatic, s/p DDD ICD 4. HFPEF - she has class 3 symptoms. We will follow. Her optivol has just gone back down. I asked her to reduce her dose of torsemide. 5. ICD - she has undergone ICD gen change out.I discussed the possibility of turning off her tachy therapies.     [name] Gowda Riki Berninger,MD

## 2023-05-11 ENCOUNTER — Encounter: Payer: Self-pay | Admitting: Adult Health

## 2023-05-11 ENCOUNTER — Non-Acute Institutional Stay (SKILLED_NURSING_FACILITY): Payer: Self-pay | Admitting: Adult Health

## 2023-05-11 ENCOUNTER — Encounter (HOSPITAL_COMMUNITY): Payer: Medicare PPO

## 2023-05-11 DIAGNOSIS — I482 Chronic atrial fibrillation, unspecified: Secondary | ICD-10-CM

## 2023-05-11 DIAGNOSIS — I700 Atherosclerosis of aorta: Secondary | ICD-10-CM | POA: Diagnosis not present

## 2023-05-11 DIAGNOSIS — R55 Syncope and collapse: Secondary | ICD-10-CM | POA: Diagnosis not present

## 2023-05-11 DIAGNOSIS — S065XAA Traumatic subdural hemorrhage with loss of consciousness status unknown, initial encounter: Secondary | ICD-10-CM | POA: Diagnosis not present

## 2023-05-11 NOTE — Progress Notes (Signed)
Location:  [facility] Room Number: 144 Place of Service:  SNF (31)   CODE STATUS: DNR  Allergies  Allergen Reactions   Codeine Nausea And Vomiting   Fentanyl Itching    Itching, redness to scalp and neck   Keflex [Cephalexin] Itching and Rash    Chief Complaint  Patient presents with   Medical Management of Chronic Issues         Cardiogenic syncope/bilateral nasal fractures/subdural hematoma:  Aortic atherosclerosis   Chronic atrial fibrillation:     HPI:  She is a resident of this facility being seen for the management of her chronic illnesses: Cardiogenic syncope/bilateral nasal fractures/subdural hematoma:  Aortic atherosclerosis Chronic atrial fibrillation:. She does continue to have bilateral lower extremity edema. She continues with shortness of breath and cough. There are no indications of distress,   Past Medical History:  Diagnosis Date   Anxiety    Asthma    Chronic atrial fibrillation (HCC)    Chronic back pain    Chronic diastolic heart failure (HCC)    Chronic obstructive pulmonary disease, unspecified (HCC)    Complete heart block (HCC)    COPD (chronic obstructive pulmonary disease) (HCC)    Coronary atherosclerosis of native coronary artery    a. s/p CABG in 1998 with LIMA-LAD, SVG-Diagonal, SVG-RI-OM b. DES to PLA in 2006   DDD (degenerative disc disease), lumbar    Essential hypertension    Headache    ICD (implantable cardioverter-defibrillator) in place    Mixed hyperlipidemia    Osteoarthritis    Ventricular fibrillation (HCC) 2003   a. seen on PPM interrogation a/w syncope    Past Surgical History:  Procedure Laterality Date   CARDIAC DEFIBRILLATOR PLACEMENT     MDT dual chamber ICD   CARDIOVERSION N/A 08/09/2014   Procedure: CARDIOVERSION;  Surgeon: Thurmon Fair, MD;  Location: [facility];  Service: Cardiovascular;  Laterality: N/A;   CORONARY ARTERY BYPASS GRAFT     LIMA to LAD, SVG to diagonal, SVG to  ramus and OM   ICD GENERATOR CHANGEOUT N/A 08/06/2021   Procedure: ICD GENERATOR CHANGEOUT;  Surgeon: Marinus Maw, MD;  Location: [facility] INVASIVE CV LAB;  Service: Cardiovascular;  Laterality: N/A;   IMPLANTABLE CARDIOVERTER DEFIBRILLATOR GENERATOR CHANGE N/A 09/25/2013   Procedure: IMPLANTABLE CARDIOVERTER DEFIBRILLATOR GENERATOR CHANGE;  Surgeon: Marinus Maw, MD;  Location: [facility] CATH LAB;  Service: Cardiovascular;  Laterality: N/A;   LEAD REVISION N/A 09/29/2013   Procedure: LEAD REVISION;  Surgeon: Duke Salvia, MD;  Location: [facility] CATH LAB;  Service: Cardiovascular;  Laterality: N/A;   TONSILLECTOMY     YAG LASER APPLICATION Left 12/27/2012   Procedure: YAG LASER APPLICATION;  Surgeon: Loraine Leriche T. Nile Riggs, MD;  Location: [facility];  Service: Ophthalmology;  Laterality: Left;   YAG LASER APPLICATION Right 01/10/2013   Procedure: YAG LASER APPLICATION;  Surgeon: Loraine Leriche T. Nile Riggs, MD;  Location: [facility];  Service: Ophthalmology;  Laterality: Right;    Social History   Socioeconomic History   Marital status: Widowed    Spouse name: Not on file   Number of children: 2   Years of education: Not on file   Highest education level: Not on file  Occupational History   Occupation: Retired    Comment: Copywriter, advertising: RETIRED  Tobacco Use   Smoking status: Never   Smokeless tobacco: Never  Vaping Use   Vaping status: Never Used  Substance and Sexual Activity   Alcohol  use: No    Alcohol/week: 0.0 standard drinks of alcohol   Drug use: No   Sexual activity: Never  Other Topics Concern   Not on file  Social History Narrative   Not on file   Social Drivers of Health   Financial Resource Strain: Not on file  Food Insecurity: No Food Insecurity (03/16/2023)   Hunger Vital Sign    Worried About Running Out of Food in the Last Year: Never true    Ran Out of Food in the Last Year: Never true  Transportation Needs: No Transportation Needs (03/16/2023)   PRAPARE - Doctor, general practice (Medical): No    Lack of Transportation (Non-Medical): No  Physical Activity: Not on file  Stress: Not on file  Social Connections: Not on file  Intimate Partner Violence: Not At Risk (03/16/2023)   Humiliation, Afraid, Rape, and Kick questionnaire    Fear of Current or Ex-Partner: No    Emotionally Abused: No    Physically Abused: No    Sexually Abused: No   Family History  Problem Relation Age of Onset   Heart attack Father    Heart attack Brother       VITAL SIGNS BP 134/62   Pulse 70   Temp 98 F (36.7 C)   Resp 20   Ht 5' (1.524 m)   Wt 169 lb 6.4 oz (76.8 kg)   SpO2 97%   BMI 33.08 kg/m   Outpatient Encounter Medications as of 05/11/2023  Medication Sig   acetaminophen (TYLENOL) 325 MG tablet Take 2 tablets (650 mg total) by mouth every 6 (six) hours as needed for mild pain (or Fever >/= 101).   albuterol (VENTOLIN HFA) 108 (90 Base) MCG/ACT inhaler Inhale 2 puffs into the lungs every 6 (six) hours as needed for wheezing or shortness of breath.   amiodarone (PACERONE) 200 MG tablet Take 1 tablet (200 mg total) by mouth daily.   apixaban (ELIQUIS) 2.5 MG TABS tablet Take 1 tablet (2.5 mg total) by mouth 2 (two) times daily.   atorvastatin (LIPITOR) 20 MG tablet Take 1 tablet (20 mg total) by mouth daily.   bisoprolol (ZEBETA) 5 MG tablet Take 1 tablet (5 mg total) by mouth daily.   Calcium Carbonate-Vitamin D (CALCIUM 600 + D PO) Take 1 tablet by mouth 2 (two) times daily.   dapagliflozin propanediol (FARXIGA) 10 MG TABS tablet Take 1 tablet (10 mg total) by mouth daily before breakfast.   Fluticasone-Umeclidin-Vilant (TRELEGY ELLIPTA) 100-62.5-25 MCG/ACT AEPB Inhale 1 puff into the lungs daily.   folic acid (FOLVITE) 1 MG tablet Take 1 tablet (1 mg total) by mouth daily.   methocarbamol (ROBAXIN) 500 MG tablet Take 1 tablet (500 mg total) by mouth every 6 (six) hours as needed for muscle spasms.   NON FORMULARY Take 1,200 mLs by mouth continuous.  1200 cc fluid restriction   Polyethyl Glycol-Propyl Glycol (SYSTANE OP) Place 1 drop into both eyes daily as needed (Dry eyes).   polyethylene glycol (MIRALAX / GLYCOLAX) 17 g packet Take 17 g by mouth daily as needed for mild constipation.   torsemide (DEMADEX) 20 MG tablet Take 3 tablets (60 mg total) by mouth daily.   No facility-administered encounter medications on file as of 05/11/2023.     SIGNIFICANT DIAGNOSTIC EXAMS  LABS REVIEWED:   03-16-23: wbc 10.3; hgb 12.1; hct 37.3; mcv 100.0 plt 186; glucose 134; bun 45; creat 1.71; k+ 3.5; na++ 134; ca 9.2 gfr 28  03-19-23: wbc 17.0; hgb 11.4; hct 36.2; mcv 100.3 plt 188; glucose 112; bun 29; creat 1.26; k+ 4.2; na++ 141; ca 8.6 gfr 41; protein 7.5 albumin 3.5 tsh 2.759 03-22-41: wbc 11.5; hgb 10.5; hct 33.0; mcv 98.8 plt 225; glucose 101; bun 59; creat 1.93; k+ 3.9; na++ 136; ca 8.8 gfr 24   NO NEW LABS.    Review of Systems  Constitutional:  Positive for malaise/fatigue.  Respiratory:  Positive for shortness of breath. Negative for cough.   Cardiovascular:  Positive for leg swelling. Negative for chest pain and palpitations.  Gastrointestinal:  Negative for abdominal pain, constipation and heartburn.  Musculoskeletal:  Negative for back pain, joint pain and myalgias.  Skin: Negative.   Neurological:  Negative for dizziness.  Psychiatric/Behavioral:  The patient is not nervous/anxious.    Physical Exam Constitutional:      General: She is not in acute distress.    Appearance: She is well-developed. She is obese. She is not diaphoretic.  Neck:     Thyroid: No thyromegaly.  Cardiovascular:     Rate and Rhythm: Normal rate and regular rhythm.     Pulses: Normal pulses.     Heart sounds: Normal heart sounds.  Pulmonary:     Effort: Pulmonary effort is normal. No respiratory distress.     Breath sounds: Rhonchi present.  Abdominal:     General: Bowel sounds are normal. There is no distension.     Palpations: Abdomen is soft.      Tenderness: There is no abdominal tenderness.  Musculoskeletal:        General: Normal range of motion.     Cervical back: Neck supple.     Right lower leg: Edema present.     Left lower leg: Edema present.  Lymphadenopathy:     Cervical: No cervical adenopathy.  Skin:    General: Skin is warm and dry.  Neurological:     Mental Status: She is alert. Mental status is at baseline.  Psychiatric:        Mood and Affect: Mood normal.   ASSESSMENT/ PLAN:  TODAY  Cardiogenic syncope/bilateral nasal fractures/subdural hematoma: will continue to monitor   2. Aortic atherosclerosis (ct 06-14-22): is on statin  3. Chronic atrial fibrillation: heart rate is stable will continue zebeta 5 mg daily; amiodarone  200 mg daily for rate control; will restart eliquis 2.5 mg twice daily   PREVIOUS   4. Chronic diastolic (congestive) heart failure: will continue torsemide 100 mg daily; farxiga 10 mg daily   5. Benign hypertension with stage 4 chronic kidney disease: b/p 134/62 will continue zebeta 5 mg daily will monitor   6. Complete heart block/ventricular fibrillation: is status post pacemaker with defibrillator; has fired one time  7. Primary pulmonary hypertension: will continue zebeta 5 mg daily   8. Obstructive pulmonary disease unspecified COPD type: will continue trelegy 100-62.5-25 mcg one puff daily; albuterol 2 puffs every 6 hours as needed  9. Anemia due to stage 4 chronic kidney disease: hgb 10.5  10. Cellulitis of right leg: will complete augmentin and will monitor her status   11. Mixed hyperlipidemia: will continue lipitor 20 mg daily   12. Chronic constipation: will continue miralax daily as needed     Synthia Innocent NP [facility]   call [phone]

## 2023-05-13 ENCOUNTER — Encounter (HOSPITAL_COMMUNITY): Payer: Medicare PPO

## 2023-05-13 ENCOUNTER — Other Ambulatory Visit (HOSPITAL_COMMUNITY)
Admission: RE | Admit: 2023-05-13 | Discharge: 2023-05-13 | Disposition: A | Discharge status: Home / Self Care | Payer: Medicare PPO | Source: Skilled Nursing Facility | Attending: Adult Health | Admitting: Adult Health

## 2023-05-13 DIAGNOSIS — I10 Essential (primary) hypertension: Secondary | ICD-10-CM | POA: Diagnosis not present

## 2023-05-13 LAB — BASIC METABOLIC PANEL
Anion gap: 16 — ABNORMAL HIGH (ref 5–15)
BUN: 70 mg/dL — ABNORMAL HIGH (ref 8–23)
CO2: 29 mmol/L (ref 22–32)
Calcium: 9.2 mg/dL (ref 8.9–10.3)
Chloride: 98 mmol/L (ref 98–111)
Creatinine, Ser: 2.29 mg/dL — ABNORMAL HIGH (ref 0.44–1.00)
GFR, Estimated: 20 mL/min — ABNORMAL LOW (ref >60)
Glucose, Bld: 100 mg/dL — ABNORMAL HIGH (ref 70–99)
Potassium: 4.2 mmol/L (ref 3.5–5.1)
Sodium: 143 mmol/L (ref 135–145)

## 2023-05-14 ENCOUNTER — Non-Acute Institutional Stay (SKILLED_NURSING_FACILITY): Payer: Medicare PPO | Admitting: Adult Health

## 2023-05-14 ENCOUNTER — Other Ambulatory Visit: Payer: Self-pay | Admitting: Adult Health

## 2023-05-14 ENCOUNTER — Other Ambulatory Visit (HOSPITAL_COMMUNITY)
Admission: RE | Admit: 2023-05-14 | Discharge: 2023-05-14 | Disposition: A | Discharge status: Home / Self Care | Payer: Medicare PPO | Source: Skilled Nursing Facility | Attending: Adult Health | Admitting: Adult Health

## 2023-05-14 ENCOUNTER — Encounter: Payer: Self-pay | Admitting: Adult Health

## 2023-05-14 DIAGNOSIS — J449 Chronic obstructive pulmonary disease, unspecified: Secondary | ICD-10-CM | POA: Diagnosis not present

## 2023-05-14 DIAGNOSIS — I5032 Chronic diastolic (congestive) heart failure: Secondary | ICD-10-CM | POA: Diagnosis not present

## 2023-05-14 DIAGNOSIS — I5033 Acute on chronic diastolic (congestive) heart failure: Secondary | ICD-10-CM | POA: Diagnosis not present

## 2023-05-14 DIAGNOSIS — J9611 Chronic respiratory failure with hypoxia: Secondary | ICD-10-CM | POA: Diagnosis not present

## 2023-05-14 LAB — CBC WITH DIFFERENTIAL/PLATELET
Abs Immature Granulocytes: 0.04 10*3/uL (ref 0.00–0.07)
Basophils Absolute: 0.1 10*3/uL (ref 0.0–0.1)
Basophils Relative: 1 %
Eosinophils Absolute: 0.2 10*3/uL (ref 0.0–0.5)
Eosinophils Relative: 3 %
HCT: 37.7 % (ref 36.0–46.0)
Hemoglobin: 11.6 g/dL — ABNORMAL LOW (ref 12.0–15.0)
Immature Granulocytes: 1 %
Lymphocytes Relative: 8 %
Lymphs Abs: 0.6 10*3/uL — ABNORMAL LOW (ref 0.7–4.0)
MCH: 30.9 pg (ref 26.0–34.0)
MCHC: 30.8 g/dL (ref 30.0–36.0)
MCV: 100.3 fL — ABNORMAL HIGH (ref 80.0–100.0)
Monocytes Absolute: 0.9 10*3/uL (ref 0.1–1.0)
Monocytes Relative: 11 %
Neutro Abs: 6.1 10*3/uL (ref 1.7–7.7)
Neutrophils Relative %: 76 %
Platelets: 179 10*3/uL (ref 150–400)
RBC: 3.76 MIL/uL — ABNORMAL LOW (ref 3.87–5.11)
RDW: 17 % — ABNORMAL HIGH (ref 11.5–15.5)
WBC: 7.9 10*3/uL (ref 4.0–10.5)
nRBC: 0 % (ref 0.0–0.2)

## 2023-05-14 LAB — BASIC METABOLIC PANEL
Anion gap: 13 (ref 5–15)
BUN: 71 mg/dL — ABNORMAL HIGH (ref 8–23)
CO2: 30 mmol/L (ref 22–32)
Calcium: 9.1 mg/dL (ref 8.9–10.3)
Chloride: 99 mmol/L (ref 98–111)
Creatinine, Ser: 2.21 mg/dL — ABNORMAL HIGH (ref 0.44–1.00)
GFR, Estimated: 21 mL/min — ABNORMAL LOW (ref >60)
Glucose, Bld: 138 mg/dL — ABNORMAL HIGH (ref 70–99)
Potassium: 3.9 mmol/L (ref 3.5–5.1)
Sodium: 142 mmol/L (ref 135–145)

## 2023-05-14 LAB — LACTIC ACID, PLASMA: Lactic Acid, Venous: 2.7 mmol/L (ref 0.5–1.9)

## 2023-05-14 LAB — BRAIN NATRIURETIC PEPTIDE: B Natriuretic Peptide: 628 pg/mL — ABNORMAL HIGH (ref 0.0–100.0)

## 2023-05-14 MED ORDER — ALPRAZOLAM 0.25 MG PO TABS
0.2500 mg | ORAL_TABLET | Freq: Three times a day (TID) | ORAL | 0 refills | Status: DC | PRN
Start: 2023-05-14 — End: 2023-06-02

## 2023-05-14 MED ORDER — MORPHINE SULFATE (CONCENTRATE) 10 MG /0.5 ML PO SOLN: 5.0000 mg | ORAL | 0 refills | Status: DC | PRN

## 2023-05-14 NOTE — Progress Notes (Unsigned)
Location:  [facility] Room Number: 144 Place of Service:  SNF (31)   CODE STATUS: dnr   Allergies  Allergen Reactions   Codeine Nausea And Vomiting   Fentanyl Itching    Itching, redness to scalp and neck   Keflex [Cephalexin] Itching and Rash    Chief Complaint  Patient presents with   Acute Visit    Respiratory distress    HPI:  She continues to with respiratory distress. She continues to cough with shortness of breath. She has had a low grade temp. Her son is wanting [place] to do what we can here but to avoid hospitalizations.   Past Medical History:  Diagnosis Date   Anxiety    Asthma    Chronic atrial fibrillation (HCC)    Chronic back pain    Chronic diastolic heart failure (HCC)    Chronic obstructive pulmonary disease, unspecified (HCC)    Complete heart block (HCC)    COPD (chronic obstructive pulmonary disease) (HCC)    Coronary atherosclerosis of native coronary artery    a. s/p CABG in 1998 with LIMA-LAD, SVG-Diagonal, SVG-RI-OM b. DES to PLA in 2006   DDD (degenerative disc disease), lumbar    Essential hypertension    Headache    ICD (implantable cardioverter-defibrillator) in place    Mixed hyperlipidemia    Osteoarthritis    Ventricular fibrillation (HCC) 2003   a. seen on PPM interrogation a/w syncope    Past Surgical History:  Procedure Laterality Date   CARDIAC DEFIBRILLATOR PLACEMENT     MDT dual chamber ICD   CARDIOVERSION N/A 08/09/2014   Procedure: CARDIOVERSION;  Surgeon: Thurmon Fair, MD;  Location: [facility] ENDOSCOPY;  Service: Cardiovascular;  Laterality: N/A;   CORONARY ARTERY BYPASS GRAFT     LIMA to LAD, SVG to diagonal, SVG to ramus and OM   ICD GENERATOR CHANGEOUT N/A 08/06/2021   Procedure: ICD GENERATOR CHANGEOUT;  Surgeon: Marinus Maw, MD;  Location: [facility] INVASIVE CV LAB;  Service: Cardiovascular;  Laterality: N/A;   IMPLANTABLE CARDIOVERTER DEFIBRILLATOR GENERATOR CHANGE N/A 09/25/2013   Procedure:  IMPLANTABLE CARDIOVERTER DEFIBRILLATOR GENERATOR CHANGE;  Surgeon: Marinus Maw, MD;  Location: [facility] CATH LAB;  Service: Cardiovascular;  Laterality: N/A;   LEAD REVISION N/A 09/29/2013   Procedure: LEAD REVISION;  Surgeon: Duke Salvia, MD;  Location: [facility] CATH LAB;  Service: Cardiovascular;  Laterality: N/A;   TONSILLECTOMY     YAG LASER APPLICATION Left 12/27/2012   Procedure: YAG LASER APPLICATION;  Surgeon: Loraine Leriche T. Nile Riggs, MD;  Location: [facility] ORS;  Service: Ophthalmology;  Laterality: Left;   YAG LASER APPLICATION Right 01/10/2013   Procedure: YAG LASER APPLICATION;  Surgeon: Loraine Leriche T. Nile Riggs, MD;  Location: [facility]S;  Service: Ophthalmology;  Laterality: Right;    Social History   Socioeconomic History   Marital status: Widowed    Spouse name: Not on file   Number of children: 2   Years of education: Not on file   Highest education level: Not on file  Occupational History   Occupation: Retired    Comment: Copywriter, advertising: RETIRED  Tobacco Use   Smoking status: Never   Smokeless tobacco: Never  Vaping Use   Vaping status: Never Used  Substance and Sexual Activity   Alcohol use: No    Alcohol/week: 0.0 standard drinks of alcohol   Drug use: No   Sexual activity: Never  Other Topics Concern   Not on file  Social History  Narrative   Not on file   Social Drivers of Health   Financial Resource Strain: Not on file  Food Insecurity: No Food Insecurity (03/16/2023)   Hunger Vital Sign    Worried About Running Out of Food in the Last Year: Never true    Ran Out of Food in the Last Year: Never true  Transportation Needs: No Transportation Needs (03/16/2023)   PRAPARE - Administrator, Civil Service (Medical): No    Lack of Transportation (Non-Medical): No  Physical Activity: Not on file  Stress: Not on file  Social Connections: Not on file  Intimate Partner Violence: Not At Risk (03/16/2023)   Humiliation, Afraid, Rape, and Kick questionnaire    Fear of  Current or Ex-Partner: No    Emotionally Abused: No    Physically Abused: No    Sexually Abused: No   Family History  Problem Relation Age of Onset   Heart attack Father    Heart attack Brother       VITAL SIGNS BP 132/76   Pulse 90   Temp 98 F (36.7 C)   Resp (!) 22   Ht 5' (1.524 m)   Wt 163 lb 14.4 oz (74.3 kg)   SpO2 (!) 88%   PF (!) 0 L/min   BMI 32.01 kg/m   Outpatient Encounter Medications as of 05/14/2023  Medication Sig   acetaminophen (TYLENOL) 325 MG tablet Take 2 tablets (650 mg total) by mouth every 6 (six) hours as needed for mild pain (or Fever >/= 101).   albuterol (VENTOLIN HFA) 108 (90 Base) MCG/ACT inhaler Inhale 2 puffs into the lungs every 6 (six) hours as needed for wheezing or shortness of breath.   ALPRAZolam (XANAX) 0.25 MG tablet Take 1 tablet (0.25 mg total) by mouth every 8 (eight) hours as needed for anxiety.   amiodarone (PACERONE) 200 MG tablet Take 1 tablet (200 mg total) by mouth daily.   apixaban (ELIQUIS) 2.5 MG TABS tablet Take 1 tablet (2.5 mg total) by mouth 2 (two) times daily.   atorvastatin (LIPITOR) 20 MG tablet Take 1 tablet (20 mg total) by mouth daily.   bisoprolol (ZEBETA) 5 MG tablet Take 1 tablet (5 mg total) by mouth daily.   Calcium Carbonate-Vitamin D (CALCIUM 600 + D PO) Take 1 tablet by mouth 2 (two) times daily.   dapagliflozin propanediol (FARXIGA) 10 MG TABS tablet Take 1 tablet (10 mg total) by mouth daily before breakfast.   Fluticasone-Umeclidin-Vilant (TRELEGY ELLIPTA) 100-62.5-25 MCG/ACT AEPB Inhale 1 puff into the lungs daily.   folic acid (FOLVITE) 1 MG tablet Take 1 tablet (1 mg total) by mouth daily.   methocarbamol (ROBAXIN) 500 MG tablet Take 1 tablet (500 mg total) by mouth every 6 (six) hours as needed for muscle spasms.   Morphine Sulfate (MORPHINE CONCENTRATE) 10 mg / 0.5 ml concentrated solution Take 0.25 mLs (5 mg total) by mouth every 4 (four) hours as needed for severe pain (pain score 7-10).   NON  FORMULARY Take 1,200 mLs by mouth continuous. 1200 cc fluid restriction   Polyethyl Glycol-Propyl Glycol (SYSTANE OP) Place 1 drop into both eyes daily as needed (Dry eyes).   polyethylene glycol (MIRALAX / GLYCOLAX) 17 g packet Take 17 g by mouth daily as needed for mild constipation.   torsemide (DEMADEX) 20 MG tablet Take 3 tablets (60 mg total) by mouth daily.   No facility-administered encounter medications on file as of 05/14/2023.     SIGNIFICANT DIAGNOSTIC  EXAMS  LABS REVIEWED:   03-16-23: wbc 10.3; hgb 12.1; hct 37.3; mcv 100.0 plt 186; glucose 134; bun 45; creat 1.71; k+ 3.5; na++ 134; ca 9.2 gfr 28 03-19-23: wbc 17.0; hgb 11.4; hct 36.2; mcv 100.3 plt 188; glucose 112; bun 29; creat 1.26; k+ 4.2; na++ 141; ca 8.6 gfr 41; protein 7.5 albumin 3.5 tsh 2.759 03-22-41: wbc 11.5; hgb 10.5; hct 33.0; mcv 98.8 plt 225; glucose 101; bun 59; creat 1.93; k+ 3.9; na++ 136; ca 8.8 gfr 24   REVIEWED:  05-06-23: glucose 105; bun 69; creat 2.26; k+ 4.1; na++ 136; ca 8.8; gfr 20; BNP 424 05-12-22: glucose 100; bun 70; creat 2.29; k+ 4.2; na++ 143; ca 9.2; gfr 20 05-14-23: wbc 7.9; hgb 11.6; hct 37.7; mcv 011.3 plt 179 glucose 138; bun 71; creat 2.21; k+ 3.9; na++ 142; ca 9.1; gfr 21; BNP 628 lactic acid 2.7    Review of Systems  Unable to perform ROS: Medical condition   Physical Exam Constitutional:      General: She is in acute distress.     Appearance: She is well-developed. She is ill-appearing. She is not diaphoretic.  Neck:     Thyroid: No thyromegaly.  Cardiovascular:     Rate and Rhythm: Normal rate and regular rhythm.     Heart sounds: Normal heart sounds.  Pulmonary:     Effort: Respiratory distress present.     Breath sounds: Wheezing, rhonchi and rales present.  Abdominal:     General: Bowel sounds are normal. There is no distension.     Palpations: Abdomen is soft.     Tenderness: There is no abdominal tenderness.  Musculoskeletal:        General: Normal range of motion.      Cervical back: Neck supple.     Right lower leg: Edema present.     Left lower leg: Edema present.  Lymphadenopathy:     Cervical: No cervical adenopathy.  Skin:    General: Skin is warm and dry.  Neurological:     Mental Status: She is alert. Mental status is at baseline.      ASSESSMENT/ PLAN:  Chronic diastolic congestive heart failure Chronic obstructive pulmonary disease Chronic respiratory failure with hypoxia   Will begin duoneb every 6 hours Will begin augmentin 500 mg twice daily Will begin zithromax 500 mg daily  Will continue to monitor her status; her chest x-ray is pending.    Synthia Innocent NP [facility]   call [phone]

## 2023-05-18 ENCOUNTER — Encounter (HOSPITAL_COMMUNITY): Payer: Medicare PPO

## 2023-05-18 DIAGNOSIS — J449 Chronic obstructive pulmonary disease, unspecified: Secondary | ICD-10-CM | POA: Diagnosis not present

## 2023-05-18 DIAGNOSIS — I5033 Acute on chronic diastolic (congestive) heart failure: Secondary | ICD-10-CM | POA: Diagnosis not present

## 2023-05-18 DIAGNOSIS — J9610 Chronic respiratory failure, unspecified whether with hypoxia or hypercapnia: Secondary | ICD-10-CM | POA: Diagnosis not present

## 2023-05-20 ENCOUNTER — Encounter (HOSPITAL_COMMUNITY): Payer: Medicare PPO

## 2023-05-22 DEATH — deceased

## 2023-05-25 ENCOUNTER — Encounter (HOSPITAL_COMMUNITY): Payer: Medicare PPO

## 2023-05-27 ENCOUNTER — Encounter (HOSPITAL_COMMUNITY): Payer: Medicare PPO

## 2023-06-01 ENCOUNTER — Encounter (HOSPITAL_COMMUNITY): Payer: Medicare PPO

## 2023-06-02 ENCOUNTER — Telehealth (HOSPITAL_COMMUNITY): Payer: Self-pay | Admitting: Cardiology

## 2023-06-02 ENCOUNTER — Telehealth (HOSPITAL_COMMUNITY): Payer: Self-pay | Admitting: Family Medicine

## 2023-06-02 NOTE — Telephone Encounter (Signed)
Son called pt passed away on 22-May-2023, please note in epic, and he request all appts to be canceled. Thanks   Chart updated

## 2023-06-03 ENCOUNTER — Encounter (HOSPITAL_COMMUNITY): Payer: Medicare PPO

## 2023-06-08 ENCOUNTER — Encounter (HOSPITAL_COMMUNITY): Payer: Medicare PPO

## 2023-06-09 ENCOUNTER — Encounter (HOSPITAL_COMMUNITY): Payer: Medicare PPO

## 2023-06-10 ENCOUNTER — Encounter (HOSPITAL_COMMUNITY): Payer: Medicare PPO

## 2023-06-11 NOTE — Progress Notes (Signed)
 Remote ICD transmission.

## 2023-06-15 ENCOUNTER — Encounter (HOSPITAL_COMMUNITY): Payer: Medicare PPO

## 2023-06-17 ENCOUNTER — Encounter (HOSPITAL_COMMUNITY): Payer: Medicare PPO

## 2023-06-28 ENCOUNTER — Ambulatory Visit: Payer: Medicare PPO | Admitting: Internal Medicine

## 2023-08-03 ENCOUNTER — Ambulatory Visit: Payer: Medicare PPO | Admitting: Cardiology

## 2023-08-06 ENCOUNTER — Ambulatory Visit: Payer: Medicare PPO

## 2023-11-05 ENCOUNTER — Ambulatory Visit: Payer: Medicare PPO

## 2024-02-04 ENCOUNTER — Ambulatory Visit: Payer: Medicare PPO
